# Patient Record
Sex: Male | Born: 1950 | Race: White | Hispanic: No | Smoking: Current some day smoker
Health system: Southern US, Community
[De-identification: ages and names within clinical notes are randomized; demographics above are authoritative.]

## PROBLEM LIST (undated history)

## (undated) DIAGNOSIS — J449 Chronic obstructive pulmonary disease, unspecified: Secondary | ICD-10-CM

## (undated) DIAGNOSIS — Z9889 Other specified postprocedural states: Secondary | ICD-10-CM

## (undated) DIAGNOSIS — I10 Essential (primary) hypertension: Secondary | ICD-10-CM

## (undated) DIAGNOSIS — C801 Malignant (primary) neoplasm, unspecified: Secondary | ICD-10-CM

## (undated) DIAGNOSIS — I771 Stricture of artery: Secondary | ICD-10-CM

## (undated) DIAGNOSIS — K559 Vascular disorder of intestine, unspecified: Secondary | ICD-10-CM

## (undated) DIAGNOSIS — I272 Pulmonary hypertension, unspecified: Secondary | ICD-10-CM

## (undated) DIAGNOSIS — T7840XA Allergy, unspecified, initial encounter: Secondary | ICD-10-CM

## (undated) DIAGNOSIS — Z720 Tobacco use: Secondary | ICD-10-CM

## (undated) DIAGNOSIS — I998 Other disorder of circulatory system: Secondary | ICD-10-CM

## (undated) DIAGNOSIS — K859 Acute pancreatitis without necrosis or infection, unspecified: Secondary | ICD-10-CM

## (undated) DIAGNOSIS — F419 Anxiety disorder, unspecified: Secondary | ICD-10-CM

## (undated) DIAGNOSIS — I739 Peripheral vascular disease, unspecified: Secondary | ICD-10-CM

## (undated) HISTORY — DX: Allergy, unspecified, initial encounter: T78.40XA

## (undated) HISTORY — DX: Tobacco use: Z72.0

## (undated) HISTORY — DX: Acute pancreatitis without necrosis or infection, unspecified: K85.90

## (undated) HISTORY — DX: Essential (primary) hypertension: I10

## (undated) HISTORY — DX: Vascular disorder of intestine, unspecified: K55.9

## (undated) HISTORY — PX: COLOSTOMY REVERSAL: SHX5782

## (undated) HISTORY — DX: Other disorder of circulatory system: I99.8

## (undated) HISTORY — DX: Peripheral vascular disease, unspecified: I73.9

## (undated) HISTORY — DX: Chronic obstructive pulmonary disease, unspecified: J44.9

## (undated) HISTORY — PX: COLECTOMY: SHX59

## (undated) HISTORY — DX: Stricture of artery: I77.1

## (undated) HISTORY — DX: Pulmonary hypertension, unspecified: I27.20

## (undated) HISTORY — DX: Other specified postprocedural states: Z98.890

## (undated) HISTORY — PX: UPPER GI ENDOSCOPY: SHX6162

## (undated) HISTORY — PX: COLOSTOMY: SHX63

---
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
## 2004-06-09 HISTORY — PX: FOOT SURGERY: SHX648

## 2005-06-09 HISTORY — PX: PANCREAS SURGERY: SHX731

## 2012-08-19 DIAGNOSIS — Z733 Stress, not elsewhere classified: Secondary | ICD-10-CM | POA: Diagnosis not present

## 2012-10-07 DIAGNOSIS — R5381 Other malaise: Secondary | ICD-10-CM | POA: Diagnosis not present

## 2012-10-07 DIAGNOSIS — F17200 Nicotine dependence, unspecified, uncomplicated: Secondary | ICD-10-CM | POA: Diagnosis not present

## 2012-10-07 DIAGNOSIS — R5383 Other fatigue: Secondary | ICD-10-CM | POA: Diagnosis not present

## 2012-10-07 DIAGNOSIS — I10 Essential (primary) hypertension: Secondary | ICD-10-CM | POA: Diagnosis not present

## 2012-10-07 DIAGNOSIS — F329 Major depressive disorder, single episode, unspecified: Secondary | ICD-10-CM | POA: Diagnosis not present

## 2012-10-07 DIAGNOSIS — E871 Hypo-osmolality and hyponatremia: Secondary | ICD-10-CM | POA: Diagnosis not present

## 2012-10-12 DIAGNOSIS — I10 Essential (primary) hypertension: Secondary | ICD-10-CM | POA: Diagnosis not present

## 2012-10-12 DIAGNOSIS — R5381 Other malaise: Secondary | ICD-10-CM | POA: Diagnosis not present

## 2012-10-12 DIAGNOSIS — E871 Hypo-osmolality and hyponatremia: Secondary | ICD-10-CM | POA: Diagnosis not present

## 2012-10-12 DIAGNOSIS — F329 Major depressive disorder, single episode, unspecified: Secondary | ICD-10-CM | POA: Diagnosis not present

## 2012-10-15 DIAGNOSIS — I10 Essential (primary) hypertension: Secondary | ICD-10-CM | POA: Diagnosis not present

## 2012-10-15 DIAGNOSIS — F17200 Nicotine dependence, unspecified, uncomplicated: Secondary | ICD-10-CM | POA: Diagnosis not present

## 2012-10-15 DIAGNOSIS — R062 Wheezing: Secondary | ICD-10-CM | POA: Diagnosis not present

## 2012-10-15 DIAGNOSIS — F329 Major depressive disorder, single episode, unspecified: Secondary | ICD-10-CM | POA: Diagnosis not present

## 2012-10-15 DIAGNOSIS — E871 Hypo-osmolality and hyponatremia: Secondary | ICD-10-CM | POA: Diagnosis not present

## 2012-10-29 DIAGNOSIS — I10 Essential (primary) hypertension: Secondary | ICD-10-CM | POA: Diagnosis not present

## 2012-12-28 DIAGNOSIS — S42009A Fracture of unspecified part of unspecified clavicle, initial encounter for closed fracture: Secondary | ICD-10-CM | POA: Diagnosis not present

## 2012-12-28 DIAGNOSIS — M25519 Pain in unspecified shoulder: Secondary | ICD-10-CM | POA: Diagnosis not present

## 2013-01-07 DIAGNOSIS — S42009A Fracture of unspecified part of unspecified clavicle, initial encounter for closed fracture: Secondary | ICD-10-CM | POA: Diagnosis not present

## 2013-01-12 DIAGNOSIS — R079 Chest pain, unspecified: Secondary | ICD-10-CM | POA: Diagnosis not present

## 2013-01-21 DIAGNOSIS — R9431 Abnormal electrocardiogram [ECG] [EKG]: Secondary | ICD-10-CM | POA: Diagnosis not present

## 2013-01-21 DIAGNOSIS — I2129 ST elevation (STEMI) myocardial infarction involving other sites: Secondary | ICD-10-CM | POA: Diagnosis not present

## 2013-01-21 DIAGNOSIS — R0789 Other chest pain: Secondary | ICD-10-CM | POA: Diagnosis not present

## 2013-02-09 DIAGNOSIS — IMO0001 Reserved for inherently not codable concepts without codable children: Secondary | ICD-10-CM | POA: Diagnosis not present

## 2013-03-09 DIAGNOSIS — IMO0001 Reserved for inherently not codable concepts without codable children: Secondary | ICD-10-CM | POA: Diagnosis not present

## 2013-04-13 DIAGNOSIS — IMO0001 Reserved for inherently not codable concepts without codable children: Secondary | ICD-10-CM | POA: Diagnosis not present

## 2013-06-17 DIAGNOSIS — E871 Hypo-osmolality and hyponatremia: Secondary | ICD-10-CM | POA: Diagnosis not present

## 2013-06-17 DIAGNOSIS — E878 Other disorders of electrolyte and fluid balance, not elsewhere classified: Secondary | ICD-10-CM | POA: Diagnosis not present

## 2013-09-21 DIAGNOSIS — M21619 Bunion of unspecified foot: Secondary | ICD-10-CM | POA: Diagnosis not present

## 2013-10-26 DIAGNOSIS — M21619 Bunion of unspecified foot: Secondary | ICD-10-CM | POA: Diagnosis not present

## 2013-10-26 DIAGNOSIS — I739 Peripheral vascular disease, unspecified: Secondary | ICD-10-CM | POA: Diagnosis not present

## 2013-10-28 DIAGNOSIS — W57XXXA Bitten or stung by nonvenomous insect and other nonvenomous arthropods, initial encounter: Secondary | ICD-10-CM | POA: Diagnosis not present

## 2013-10-28 DIAGNOSIS — T148 Other injury of unspecified body region: Secondary | ICD-10-CM | POA: Diagnosis not present

## 2013-11-01 DIAGNOSIS — T148 Other injury of unspecified body region: Secondary | ICD-10-CM | POA: Diagnosis not present

## 2013-11-04 DIAGNOSIS — I739 Peripheral vascular disease, unspecified: Secondary | ICD-10-CM | POA: Diagnosis not present

## 2013-11-04 DIAGNOSIS — I743 Embolism and thrombosis of arteries of the lower extremities: Secondary | ICD-10-CM | POA: Diagnosis not present

## 2013-11-04 DIAGNOSIS — Z01818 Encounter for other preprocedural examination: Secondary | ICD-10-CM | POA: Diagnosis not present

## 2013-11-30 DIAGNOSIS — I739 Peripheral vascular disease, unspecified: Secondary | ICD-10-CM | POA: Diagnosis not present

## 2013-12-01 DIAGNOSIS — Z131 Encounter for screening for diabetes mellitus: Secondary | ICD-10-CM | POA: Diagnosis not present

## 2013-12-01 DIAGNOSIS — Z125 Encounter for screening for malignant neoplasm of prostate: Secondary | ICD-10-CM | POA: Diagnosis not present

## 2013-12-01 DIAGNOSIS — Z0389 Encounter for observation for other suspected diseases and conditions ruled out: Secondary | ICD-10-CM | POA: Diagnosis not present

## 2013-12-01 DIAGNOSIS — Z136 Encounter for screening for cardiovascular disorders: Secondary | ICD-10-CM | POA: Diagnosis not present

## 2013-12-07 DIAGNOSIS — M21619 Bunion of unspecified foot: Secondary | ICD-10-CM | POA: Diagnosis not present

## 2013-12-19 DIAGNOSIS — F17200 Nicotine dependence, unspecified, uncomplicated: Secondary | ICD-10-CM | POA: Diagnosis not present

## 2013-12-19 DIAGNOSIS — R972 Elevated prostate specific antigen [PSA]: Secondary | ICD-10-CM | POA: Diagnosis not present

## 2013-12-19 DIAGNOSIS — E7800 Pure hypercholesterolemia, unspecified: Secondary | ICD-10-CM | POA: Diagnosis not present

## 2013-12-28 DIAGNOSIS — R39198 Other difficulties with micturition: Secondary | ICD-10-CM | POA: Diagnosis not present

## 2013-12-28 DIAGNOSIS — N529 Male erectile dysfunction, unspecified: Secondary | ICD-10-CM | POA: Diagnosis not present

## 2014-05-19 DIAGNOSIS — L2489 Irritant contact dermatitis due to other agents: Secondary | ICD-10-CM | POA: Diagnosis not present

## 2014-05-19 DIAGNOSIS — E871 Hypo-osmolality and hyponatremia: Secondary | ICD-10-CM | POA: Diagnosis not present

## 2014-05-19 DIAGNOSIS — R972 Elevated prostate specific antigen [PSA]: Secondary | ICD-10-CM | POA: Diagnosis not present

## 2014-05-19 DIAGNOSIS — E780 Pure hypercholesterolemia: Secondary | ICD-10-CM | POA: Diagnosis not present

## 2014-05-19 DIAGNOSIS — L853 Xerosis cutis: Secondary | ICD-10-CM | POA: Diagnosis not present

## 2014-05-19 DIAGNOSIS — Z23 Encounter for immunization: Secondary | ICD-10-CM | POA: Diagnosis not present

## 2014-05-24 DIAGNOSIS — E871 Hypo-osmolality and hyponatremia: Secondary | ICD-10-CM | POA: Diagnosis not present

## 2014-08-01 DIAGNOSIS — J069 Acute upper respiratory infection, unspecified: Secondary | ICD-10-CM | POA: Diagnosis not present

## 2014-08-01 DIAGNOSIS — Z720 Tobacco use: Secondary | ICD-10-CM | POA: Diagnosis not present

## 2014-08-11 DIAGNOSIS — B9789 Other viral agents as the cause of diseases classified elsewhere: Secondary | ICD-10-CM | POA: Diagnosis not present

## 2014-08-11 DIAGNOSIS — Z720 Tobacco use: Secondary | ICD-10-CM | POA: Diagnosis not present

## 2014-08-11 DIAGNOSIS — J069 Acute upper respiratory infection, unspecified: Secondary | ICD-10-CM | POA: Diagnosis not present

## 2014-08-11 DIAGNOSIS — Z0000 Encounter for general adult medical examination without abnormal findings: Secondary | ICD-10-CM | POA: Diagnosis not present

## 2014-08-11 DIAGNOSIS — J4521 Mild intermittent asthma with (acute) exacerbation: Secondary | ICD-10-CM | POA: Diagnosis not present

## 2014-08-14 DIAGNOSIS — R072 Precordial pain: Secondary | ICD-10-CM | POA: Diagnosis not present

## 2014-09-29 DIAGNOSIS — Z720 Tobacco use: Secondary | ICD-10-CM | POA: Diagnosis not present

## 2014-09-29 DIAGNOSIS — I10 Essential (primary) hypertension: Secondary | ICD-10-CM | POA: Diagnosis not present

## 2014-09-29 DIAGNOSIS — Z125 Encounter for screening for malignant neoplasm of prostate: Secondary | ICD-10-CM | POA: Diagnosis not present

## 2014-09-29 DIAGNOSIS — J449 Chronic obstructive pulmonary disease, unspecified: Secondary | ICD-10-CM | POA: Diagnosis not present

## 2014-10-13 DIAGNOSIS — Z125 Encounter for screening for malignant neoplasm of prostate: Secondary | ICD-10-CM | POA: Diagnosis not present

## 2014-10-13 DIAGNOSIS — I10 Essential (primary) hypertension: Secondary | ICD-10-CM | POA: Diagnosis not present

## 2014-10-18 ENCOUNTER — Other Ambulatory Visit: Payer: Self-pay | Admitting: Internal Medicine

## 2014-10-18 DIAGNOSIS — R739 Hyperglycemia, unspecified: Secondary | ICD-10-CM | POA: Diagnosis not present

## 2014-10-18 DIAGNOSIS — J449 Chronic obstructive pulmonary disease, unspecified: Secondary | ICD-10-CM | POA: Diagnosis not present

## 2014-10-18 DIAGNOSIS — R972 Elevated prostate specific antigen [PSA]: Secondary | ICD-10-CM | POA: Diagnosis not present

## 2014-10-18 DIAGNOSIS — I10 Essential (primary) hypertension: Secondary | ICD-10-CM | POA: Diagnosis not present

## 2014-10-18 DIAGNOSIS — R911 Solitary pulmonary nodule: Secondary | ICD-10-CM

## 2014-10-25 ENCOUNTER — Ambulatory Visit
Admission: RE | Admit: 2014-10-25 | Discharge: 2014-10-25 | Disposition: A | Discharge status: Home / Self Care | Payer: Medicare Other | Source: Ambulatory Visit | Attending: Internal Medicine | Admitting: Internal Medicine

## 2014-10-25 DIAGNOSIS — J432 Centrilobular emphysema: Secondary | ICD-10-CM | POA: Diagnosis not present

## 2014-10-25 DIAGNOSIS — R911 Solitary pulmonary nodule: Secondary | ICD-10-CM | POA: Diagnosis not present

## 2014-10-25 MED ORDER — IOHEXOL 300 MG/ML  SOLN
75.0000 mL | Freq: Once | INTRAMUSCULAR | Status: AC | PRN
  Administered 2014-10-25: 75 mL via INTRAVENOUS

## 2014-10-30 ENCOUNTER — Encounter: Payer: Self-pay | Admitting: *Deleted

## 2014-10-30 ENCOUNTER — Other Ambulatory Visit (HOSPITAL_COMMUNITY): Payer: Self-pay | Admitting: Respiratory Therapy

## 2014-10-30 ENCOUNTER — Other Ambulatory Visit (HOSPITAL_COMMUNITY): Payer: Self-pay | Admitting: Internal Medicine

## 2014-10-30 DIAGNOSIS — J441 Chronic obstructive pulmonary disease with (acute) exacerbation: Secondary | ICD-10-CM

## 2014-10-30 DIAGNOSIS — R911 Solitary pulmonary nodule: Secondary | ICD-10-CM

## 2014-10-31 ENCOUNTER — Telehealth: Payer: Self-pay | Admitting: Internal Medicine

## 2014-10-31 NOTE — Telephone Encounter (Signed)
NEW PATIENT APPT-LEFT MESSAGE FOR PATIENT TO RETURN CALL

## 2014-11-01 ENCOUNTER — Inpatient Hospital Stay (HOSPITAL_COMMUNITY): Admission: RE | Admit: 2014-11-01 | Payer: Medicare Other | Source: Ambulatory Visit

## 2014-11-01 ENCOUNTER — Ambulatory Visit (HOSPITAL_COMMUNITY): Payer: Medicare Other | Attending: Internal Medicine

## 2014-11-02 ENCOUNTER — Encounter (HOSPITAL_COMMUNITY): Payer: Medicare Other

## 2014-11-03 ENCOUNTER — Telehealth: Payer: Self-pay | Admitting: Internal Medicine

## 2014-11-03 ENCOUNTER — Other Ambulatory Visit: Payer: Self-pay | Admitting: *Deleted

## 2014-11-03 DIAGNOSIS — R918 Other nonspecific abnormal finding of lung field: Secondary | ICD-10-CM

## 2014-11-03 NOTE — Telephone Encounter (Signed)
new patient appt-left message for patient to return call. Called referring left message for [name] stating message was left for patient to return call.

## 2014-11-03 NOTE — Progress Notes (Signed)
Oncology Nurse Navigator Documentation  Oncology Nurse Navigator Flowsheets 11/03/2014  Navigator Encounter Type Other.  Received referral and noted PET scan was scheduled for 11/02/14.  I looked today and scan was not completed.  I gave Jonelle Sidle a time to schedule patient on June 13th.  I called referring office, Dr. [name] to have them re-schedule PET.  I was unable to speak to anyone nor leave a vm message.    Time Spent with Patient 15

## 2014-11-15 DIAGNOSIS — R739 Hyperglycemia, unspecified: Secondary | ICD-10-CM | POA: Diagnosis not present

## 2014-11-15 DIAGNOSIS — E871 Hypo-osmolality and hyponatremia: Secondary | ICD-10-CM | POA: Diagnosis not present

## 2014-11-17 ENCOUNTER — Ambulatory Visit (HOSPITAL_COMMUNITY)
Admission: RE | Admit: 2014-11-17 | Discharge: 2014-11-17 | Disposition: A | Discharge status: Home / Self Care | Payer: Medicare Other | Source: Ambulatory Visit | Attending: Internal Medicine | Admitting: Internal Medicine

## 2014-11-17 DIAGNOSIS — K868 Other specified diseases of pancreas: Secondary | ICD-10-CM | POA: Insufficient documentation

## 2014-11-17 DIAGNOSIS — I2510 Atherosclerotic heart disease of native coronary artery without angina pectoris: Secondary | ICD-10-CM | POA: Diagnosis not present

## 2014-11-17 DIAGNOSIS — R911 Solitary pulmonary nodule: Secondary | ICD-10-CM | POA: Diagnosis not present

## 2014-11-17 DIAGNOSIS — J432 Centrilobular emphysema: Secondary | ICD-10-CM | POA: Insufficient documentation

## 2014-11-17 LAB — GLUCOSE, CAPILLARY: Glucose-Capillary: 91 mg/dL (ref 65–99)

## 2014-11-17 MED ORDER — FLUDEOXYGLUCOSE F - 18 (FDG) INJECTION
4.9800 | Freq: Once | INTRAVENOUS | Status: AC | PRN
  Administered 2014-11-17: 4.98 via INTRAVENOUS

## 2014-11-20 ENCOUNTER — Ambulatory Visit (HOSPITAL_BASED_OUTPATIENT_CLINIC_OR_DEPARTMENT_OTHER): Payer: Medicare Other | Admitting: Internal Medicine

## 2014-11-20 ENCOUNTER — Encounter: Payer: Self-pay | Admitting: *Deleted

## 2014-11-20 ENCOUNTER — Encounter: Payer: Self-pay | Admitting: Internal Medicine

## 2014-11-20 ENCOUNTER — Ambulatory Visit: Payer: Medicare Other

## 2014-11-20 ENCOUNTER — Other Ambulatory Visit (HOSPITAL_BASED_OUTPATIENT_CLINIC_OR_DEPARTMENT_OTHER): Payer: Medicare Other

## 2014-11-20 ENCOUNTER — Other Ambulatory Visit: Payer: Self-pay | Admitting: Internal Medicine

## 2014-11-20 VITALS — BP 123/87 | HR 67 | Temp 98.0°F | Resp 18 | Ht 62.0 in | Wt 93.5 lb

## 2014-11-20 DIAGNOSIS — J449 Chronic obstructive pulmonary disease, unspecified: Secondary | ICD-10-CM | POA: Diagnosis not present

## 2014-11-20 DIAGNOSIS — Z720 Tobacco use: Secondary | ICD-10-CM

## 2014-11-20 DIAGNOSIS — R918 Other nonspecific abnormal finding of lung field: Secondary | ICD-10-CM

## 2014-11-20 DIAGNOSIS — I10 Essential (primary) hypertension: Secondary | ICD-10-CM | POA: Diagnosis not present

## 2014-11-20 DIAGNOSIS — Z809 Family history of malignant neoplasm, unspecified: Secondary | ICD-10-CM | POA: Diagnosis not present

## 2014-11-20 LAB — CBC WITH DIFFERENTIAL/PLATELET
BASO%: 1.2 % (ref 0.0–2.0)
Basophils Absolute: 0.1 10*3/uL (ref 0.0–0.1)
EOS%: 5.4 % (ref 0.0–7.0)
Eosinophils Absolute: 0.2 10*3/uL (ref 0.0–0.5)
HEMATOCRIT: 42.2 % (ref 38.4–49.9)
HGB: 14 g/dL (ref 13.0–17.1)
LYMPH#: 1.2 10*3/uL (ref 0.9–3.3)
LYMPH%: 26.8 % (ref 14.0–49.0)
MCH: 31 pg (ref 27.2–33.4)
MCHC: 33.2 g/dL (ref 32.0–36.0)
MCV: 93.2 fL (ref 79.3–98.0)
MONO#: 0.8 10*3/uL (ref 0.1–0.9)
MONO%: 17.8 % — ABNORMAL HIGH (ref 0.0–14.0)
NEUT#: 2.1 10*3/uL (ref 1.5–6.5)
NEUT%: 48.8 % (ref 39.0–75.0)
Platelets: 206 10*3/uL (ref 140–400)
RBC: 4.52 10*6/uL (ref 4.20–5.82)
RDW: 15.2 % — ABNORMAL HIGH (ref 11.0–14.6)
WBC: 4.3 10*3/uL (ref 4.0–10.3)

## 2014-11-20 LAB — COMPREHENSIVE METABOLIC PANEL (CC13)
ALT: 20 U/L (ref 0–55)
AST: 39 U/L — AB (ref 5–34)
Albumin: 3.6 g/dL (ref 3.5–5.0)
Alkaline Phosphatase: 131 U/L (ref 40–150)
Anion Gap: 11 mEq/L (ref 3–11)
BUN: 10.9 mg/dL (ref 7.0–26.0)
CO2: 22 mEq/L (ref 22–29)
Calcium: 9.3 mg/dL (ref 8.4–10.4)
Chloride: 102 mEq/L (ref 98–109)
Creatinine: 0.8 mg/dL (ref 0.7–1.3)
EGFR: >90 mL/min/{1.73_m2} (ref >90)
Glucose: 100 mg/dl (ref 70–140)
POTASSIUM: 4.5 meq/L (ref 3.5–5.1)
SODIUM: 135 meq/L — AB (ref 136–145)
TOTAL PROTEIN: 7.3 g/dL (ref 6.4–8.3)
Total Bilirubin: 0.29 mg/dL (ref 0.20–1.20)

## 2014-11-20 NOTE — Progress Notes (Signed)
Checked in new pt with no financial concerns prior to seeing the dr. [name] has 2 insurances so financial assistance may not be needed but he has my card for any questions or concerns.

## 2014-11-20 NOTE — Progress Notes (Signed)
[facility] [phone]   [phone]  CONSULT NOTE  REFERRING PHYSICIAN: Dr. [name]  REASON FOR CONSULTATION:  64 years old white male with pulmonary nodules.  HPI [name] is a 64 y.o. male with past medical history significant for hypertension, ischemic bowel disease, COPD as well as long history of smoking. The patient was seen by Dr. [name] to establish care with him. During his evaluation routine chest x-ray was performed that showed nodules in the left lung. This was followed by CT scan of the chest on 10/25/2014 and it showed a spiculated nodule of the left upper lobe, abutting the minor fissure and measuring 1.0 cm. There was also 0.8-0.9 cm nodule of the left apex involving the visceral pleura. A PET scan was performed on 11/17/2014 and it showed 10 mm spiculated posterior left upper lobe nodule is hypermetabolic consistent with neoplasm. 1.9 cm hypermetabolic a amorphous lesion is seen in the posteromedial left lower lobe. Synchronous neoplasm cannot be excluded. The patient was referred to me today for further evaluation and recommendation regarding these findings and his lung. When seen today he is feeling fine with no specific complaints except for baseline shortness of breath and cough productive of clear sputum. He denied having any significant chest pain or hemoptysis. The patient has no weight loss or night sweats. He denied having any significant nausea or vomiting, no headache or visual changes. 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  FAMILY HISTORY: Father had hypertension and osteoarthritis, mother had COPD, brother had cancer   SOCIAL HISTORY: The patient is single and was accompanied by his girlfriend, Donita. He is currently retired and used to do dry oral work. Has a history of smoking up to 2 packs per day for around  51 years but he is currently smoking half a pack per day. He drinks 5 beers every night and no history of drug abuse.   History  Substance Use Topics  . Smoking status: Current Every Day Smoker -- 0.50 packs/day for 15 years  . Smokeless tobacco: Not on file  . Alcohol Use: 2.4 oz/week    4 Cans of beer per week     Comment: per day    Not on File  Current Outpatient Prescriptions  Medication Sig Dispense Refill  . albuterol (PROVENTIL) (2.5 MG/3ML) 0.083% nebulizer solution Take 2.5 mg by nebulization every 6 (six) hours as needed for wheezing or shortness of breath.    Marland Kitchen amLODipine (NORVASC) 10 MG tablet Take 10 mg by mouth daily.    Marland Kitchen buPROPion (WELLBUTRIN XL) 150 MG 24 hr tablet Take 150 mg by mouth daily.    . metoprolol succinate (TOPROL-XL) 25 MG 24 hr tablet Take 25 mg by mouth daily.    Marland Kitchen PROAIR HFA 108 (90 BASE) MCG/ACT inhaler Inhale 90 mcg into the lungs as needed.     No current facility-administered medications for this visit.    Review of Systems  Constitutional: negative Eyes: negative Ears, nose, mouth, throat, and face: negative Respiratory: positive for cough, dyspnea on exertion and sputum Cardiovascular: negative Gastrointestinal: negative Genitourinary:negative Integument/breast: negative Hematologic/lymphatic: negative Musculoskeletal:negative Neurological: negative Behavioral/Psych: negative Endocrine: negative Allergic/Immunologic: negative  Physical Exam  MCN:OBSJG, healthy, no distress and malnourished SKIN: skin color, texture, turgor are normal, no rashes or significant lesions HEAD: Normocephalic, No masses, lesions, tenderness or abnormalities EYES: normal, PERRLA EARS: External ears normal, Canals  clear OROPHARYNX:no exudate, no erythema and lips, buccal mucosa, and tongue normal  NECK: supple, no adenopathy, no JVD LYMPH:  no palpable lymphadenopathy, no hepatosplenomegaly LUNGS: clear to auscultation , and palpation HEART: regular  rate & rhythm and no murmurs ABDOMEN:abdomen soft, non-tender and normal bowel sounds BACK: Back symmetric, no curvature., No CVA tenderness EXTREMITIES:no joint deformities, effusion, or inflammation, no edema, no skin discoloration  NEURO: alert & oriented x 3 with fluent speech, no focal motor/sensory deficits  PERFORMANCE STATUS: ECOG 1  LABORATORY DATA: Lab Results  Component Value Date   WBC 4.3 11/20/2014   HGB 14.0 11/20/2014   HCT 42.2 11/20/2014   MCV 93.2 11/20/2014   PLT 206 11/20/2014      Chemistry      Component Value Date/Time   NA 135* 11/20/2014 1352   K 4.5 11/20/2014 1352   CO2 22 11/20/2014 1352   BUN 10.9 11/20/2014 1352   CREATININE 0.8 11/20/2014 1352      Component Value Date/Time   CALCIUM 9.3 11/20/2014 1352   ALKPHOS 131 11/20/2014 1352   AST 39* 11/20/2014 1352   ALT 20 11/20/2014 1352   BILITOT 0.29 11/20/2014 1352       RADIOGRAPHIC STUDIES: Ct Chest W Contrast  10/25/2014   CLINICAL DATA:  64 year old male with a history of lung nodule. Shortness of breath.  EXAM: CT CHEST WITH CONTRAST  TECHNIQUE: Multidetector CT imaging of the chest was performed during intravenous contrast administration.  CONTRAST:  44m OMNIPAQUE IOHEXOL 300 MG/ML  SOLN  COMPARISON:  None.  FINDINGS: Chest:  Unremarkable superficial soft tissues of the chest wall.  Bilateral axillary lymph nodes. Largest on the left measures 6 mm. No supraclavicular adenopathy. No scalene adenopathy.  Unremarkable appearance of the thoracic inlet. Unremarkable thyroid.  Lymph nodes within multiple mediastinal nodal stations, none of which are enlarged by CT size criteria.  Heart size within normal limits. Trace pericardial fluid/ thickening.  Calcifications of left main, left anterior descending, circumflex, right coronary arteries.  Atherosclerotic changes of the thoracic aorta. No aneurysm or dissection flap. No periaortic fluid.  Unremarkable appearance of pulmonary artery. No central,  lobar, segmental, or proximal subsegmental filling defects.  Extensive paraseptal/ centrilobular emphysema the bilateral lungs with hyperinflation.  Minimal architectural distortion/ scarring of the lungs. No confluent airspace disease, pneumothorax, or pleural effusion.  Spiculated nodule of the left upper lobe, abutting the minor fissure measuring 1 cm on image 26 of series 5.  8 mm to 9 mm nodule of the left apex involving the visceral pleura. Partially calcified nodule of the right lower lobe adjacent to the major fissure, likely a granuloma.  No significant bronchial wall thickening. Central airways are clear.  Upper abdomen:  Unremarkable liver and spleen.  Small hiatal hernia.  Calcifications of the pancreas. Mild dilation of the pancreatic duct within the pancreatic body. Calcifications at the pancreatic head.  Unremarkable gallbladder.  Unremarkable appearance of the bilateral kidneys.  Unremarkable appearance of the visualized small bowel and colon.  Calcifications of abdominal aorta.  No displaced fracture. Degenerative changes of the visualized spine. Sclerotic focus of T10 posterior rib vertebral body.  IMPRESSION: Suspicious spiculated nodule of the left upper lobe measuring 1 cm abutting the major fissure. Further evaluation with PET-CT is recommended.  Nonenlarged mediastinal and axillary lymph nodes, potentially reactive.  Nodule of the left apex extending to the visceral pleura measures 8 mm to 9 mm. Recommend attention on future follow-up studies.  Extensive paraseptal/centrilobular emphysema.  Architectural  distortion with ill-defined nodularity along the medial aspect of the left lower lobe, potentially inflammatory/scarring. Recommend attention on future follow-up images.  Trace pericardial fluid/ thickening.  Pancreatic duct is dilated, with incomplete imaging of the pancreatic head. Calcifications of the pancreatic parenchyma present, potential representing chronic inflammation/ prior  pancreatitis. Further evaluation of the pancreas is recommended with contrast-enhanced CT or contrast-enhanced MRI.  These results were called by telephone at the time of interpretation on 10/25/2014 at 1:39 pm to Dr. Rosalie Doctor nurse, Ms Allene Dillon, who verbally acknowledged these results.  Signed,  Dulcy Fanny. Earleen Newport, DO  [facility]  [facility]   Electronically Signed   By: Corrie Mckusick D.O.   On: 10/25/2014 13:45   Nm Pet Image Initial (pi) Skull Base To Thigh  11/17/2014   CLINICAL DATA:  Initial treatment strategy for lung nodule.  EXAM: NUCLEAR MEDICINE PET SKULL BASE TO THIGH  TECHNIQUE: 5.0 mCi F-18 FDG was injected intravenously. Full-ring PET imaging was performed from the skull base to thigh after the radiotracer. CT data was obtained and used for attenuation correction and anatomic localization.  FASTING BLOOD GLUCOSE:  Value: 91 mg/dl  COMPARISON:  None.  FINDINGS: NECK  No hypermetabolic lymph nodes in the neck.  CHEST  No hypermetabolic mediastinal or hilar nodes. Heart size is normal. Coronary artery calcification is evident. No pericardial effusion.  Lung windows reveal centrilobular emphysema bilaterally. 10 mm spiculated nodule in the posterior left upper lobe is hypermetabolic with SUV max = 2.5. 1.9 cm Ill-defined area of a amorphous soft tissue attenuation is seen in the posteromedial left lower lobe (image 51 series 6) with SUV max = 2.7. 7 mm ground-glass nodule in the posterior right upper lobe on image 24 shows no discernible hypermetabolic FDG uptake on PET imaging. Right lower lobe calcified granulomas seen on image 60 series 6.  ABDOMEN/PELVIS  No abnormal hypermetabolic activity within the liver, adrenal glands, or spleen. There is an area of increased FDG uptake in the head of the pancreas with SUV max = 3.8. CT images show clustered dystrophic calcification in the head and uncinate process of the pancreas at this location. These  changes are associated with diffuse distention of the main pancreatic duct and scattered parenchymal calcification in the body and tail of pancreas.  Probable calcified gallstone in the neck of the gallbladder. Dense atherosclerotic calcification is seen in the wall of the abdominal aorta without aneurysm. Surgical mesh is seen in the anterior aspect of the left abdomen there is thickening along the deep rectus fascia of the right anterior abdominal wall with scattered areas of mineralization and possible surgical material. This area shows low level FDG uptake.  Left groin hernia contains only fat.  No hypermetabolic lymph nodes in the abdomen or pelvis.  There is a small focus of hypermetabolism seen along the right wall of the distal rectum with SUV max = 4.7  SKELETON  No focal hypermetabolic activity to suggest skeletal metastasis.  IMPRESSION: 1. 10 mm spiculated posterior left upper lobe nodule is hypermetabolic consistent with neoplasm. 2. 1.9 cm hypermetabolic a amorphous lesion is seen in the posteromedial left lower lobe. Synchronous neoplasm cannot be excluded. 3. Diffuse dystrophic calcification in the pancreatic parenchyma with associated diffuse distention of the main pancreatic duct. Calcification is more focal in the region of the pancreatic head in the same area shows hypermetabolism. The hypermetabolic uptake and calcification in the head of the pancreas may be related to a history  of chronic/ongoing inflammation related to chronic pancreatitis. Underlying pancreatic neoplasm involving the head of the pancreas cannot be excluded. 4. Surgical mesh is seen in the anterior left abdominal wall. There is a larger area of peritoneal thickening associated with the anterior right abdominal wall. This is less definitely related to match and the changes seen on the left side and the area of thickening shows low level FDG uptake. A degree of uptake is within the spectrum of that seen with granulation  secondary to scar/healing, but correlation for previous bilateral anterior abdominal wall surgery is recommended as peritoneal or even omental neoplastic disease cannot be completely excluded. 5. Small hypermetabolic focus in the distal rectum/anus. Polyp/neoplasm at this location is a possibility.   Electronically Signed   By: Misty Stanley M.D.   On: 11/17/2014 13:36    ASSESSMENT: This is a very pleasant 64 years old white male with highly suspicious hypermetabolic pulmonary nodules in the left lung concerning for lung cancer.   PLAN: I had a lengthy discussion with the patient his girlfriend about the finding on his CT scan as well as a PET scan. I showed the images of the PET scan. The patient already have pulmonary function tests performed recently. I will arrange for him to see thoracic surgery for evaluation of resection of tissue diagnosis if he is not a surgical candidate. I will see him back for follow-up visit after his surgical evaluation. He was advised to call immediately if he has any concerning symptoms in the interval.  The patient voices understanding of current disease status and treatment options and is in agreement with the current care plan.  All questions were answered. The patient knows to call the clinic with any problems, questions or concerns. We can certainly see the patient much sooner if necessary.  Thank you so much for allowing me to participate in the care of [facility]. I will continue to follow up the patient with you and assist in his care.  I spent 30 minutes counseling the patient face to face. The total time spent in the appointment was 55 minutes.  Disclaimer: This note was dictated with voice recognition software. Similar sounding words can inadvertently be transcribed and may not be corrected upon review.   Mykah Bellomo K. November 20, 2014, 4:05 PM

## 2014-11-20 NOTE — Progress Notes (Signed)
Oncology Nurse Navigator Documentation  Oncology Nurse Navigator Flowsheets 11/20/2014  Navigator Encounter Type Initial MedOnc  Patient Visit Type Medonc  Treatment Phase Abnormal Scans  Barriers/Navigation Needs Education  Education Smoking cessation  Coordination of Care MD Appointments/Spoke with patient and girlfriend today during his first visit with Dr. Julien Nordmann.  Patient has work up scans and PFT's but no tissue dx at this time.  I set him up for thoracic clinic on 11/23/14 per Dr. Worthy Flank request with surgery.  I gave him time and date of appt and he verbalized understanding.  We also spoke about smoking cessation.  I gave him information on smoking cessation.   Support Groups/Services Other  Time Spent with Patient 30

## 2014-11-22 DIAGNOSIS — J449 Chronic obstructive pulmonary disease, unspecified: Secondary | ICD-10-CM | POA: Diagnosis not present

## 2014-11-22 DIAGNOSIS — E871 Hypo-osmolality and hyponatremia: Secondary | ICD-10-CM | POA: Diagnosis not present

## 2014-11-22 DIAGNOSIS — I10 Essential (primary) hypertension: Secondary | ICD-10-CM | POA: Diagnosis not present

## 2014-11-22 DIAGNOSIS — R739 Hyperglycemia, unspecified: Secondary | ICD-10-CM | POA: Diagnosis not present

## 2014-11-23 ENCOUNTER — Other Ambulatory Visit: Payer: Self-pay | Admitting: *Deleted

## 2014-11-23 ENCOUNTER — Encounter: Payer: Self-pay | Admitting: *Deleted

## 2014-11-23 ENCOUNTER — Encounter: Payer: Self-pay | Admitting: Thoracic Surgery (Cardiothoracic Vascular Surgery)

## 2014-11-23 ENCOUNTER — Institutional Professional Consult (permissible substitution) (INDEPENDENT_AMBULATORY_CARE_PROVIDER_SITE_OTHER): Payer: Medicare Other | Admitting: Thoracic Surgery (Cardiothoracic Vascular Surgery)

## 2014-11-23 VITALS — BP 126/76 | HR 57 | Temp 97.7°F | Resp 17 | Ht 62.0 in | Wt 92.4 lb

## 2014-11-23 DIAGNOSIS — R918 Other nonspecific abnormal finding of lung field: Secondary | ICD-10-CM

## 2014-11-23 DIAGNOSIS — J432 Centrilobular emphysema: Secondary | ICD-10-CM

## 2014-11-23 DIAGNOSIS — Q453 Other congenital malformations of pancreas and pancreatic duct: Secondary | ICD-10-CM

## 2014-11-23 DIAGNOSIS — K8689 Other specified diseases of pancreas: Secondary | ICD-10-CM | POA: Insufficient documentation

## 2014-11-23 LAB — PULMONARY FUNCTION TEST
DL/VA % pred: 43 %
DL/VA: 1.75 ml/min/mmHg/L
DLCO UNC % PRED: 35 %
DLCO unc: 7.75 ml/min/mmHg
FEF 25-75 Pre: 0.32 L/sec
FEF2575-%Pred-Pre: 15 %
FEV1-%PRED-PRE: 49 %
FEV1-Pre: 1.25 L
FEV1FVC-%Pred-Pre: 65 %
FEV6-%Pred-Pre: 65 %
FEV6-PRE: 2.1 L
FEV6FVC-%Pred-Pre: 88 %
FVC-%Pred-Pre: 74 %
FVC-PRE: 2.54 L
PRE FEV6/FVC RATIO: 83 %
Pre FEV1/FVC ratio: 49 %
RV % PRED: 217 %
RV: 4.07 L
TLC % pred: 125 %
TLC: 6.76 L

## 2014-11-23 NOTE — Progress Notes (Signed)
Oncology Nurse Navigator Documentation  Oncology Nurse Navigator Flowsheets 11/23/2014  Navigator Encounter Type Other/MDC/Surgery  Patient Visit Type Follow-up  Treatment Phase Other/Pre tx  Barriers/Navigation Needs Education.  Explained next steps  Education Next steps  Coordination of Care MD Appointments/Patient needs referral to GI before he can precede with lung work up and biopsy.  I notified HIM dept to refer patient and completed EPIC referral.  I asked that this referral be expedited.   Time Spent with Patient 30

## 2014-11-23 NOTE — Progress Notes (Signed)
[address]       [place] [address]             [phone]      PCP is [name], MD Referring Provider is [name], MD  No chief complaint on file.   HPI: 64 yo man presents with a cc/o lung nodules.  [name] is a 64 yo man with a history of heavy tobacco abuse, 2 ppd x 50 years. He is currently smoking about 0.5 ppd as he is trying to quit. 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  He recently had a physical with Dr. Maudie King. A chest x-ray showed a left lung nodule. A CT showed multiple lung nodules. A PET showed hypermetabolic activity in 3 nodules in the left lung. There is an 8 mm nodule at the apex with mild hypermetabolism, a 9 mm nodule in the left upper lobe along the fissure with significant activity given the size of the lesion, and an amorphous area in the left lower lobe with low grade activity. There was no hilar or mediastinal activity. There was an area of increased metabolic activity in the head of the pancreas in a mass lesion with calcifications.  He is disabled from work but remains relatively active with yard work. He gets SOB with activity but can walk up a flight of stairs without stopping. He would be short of breath. He denies chest pain, pressure or tightness. He has a chronic cough, denies hemoptysis. He has lost 5 pounds in the last 3 months. Now weighs 92 pounds. Denies abdominal pain, N/V.  Past Medical History  Diagnosis Date  . Hypertension   . COPD (chronic obstructive pulmonary disease)   . Status post left foot surgery   . Ischemic bowel disease   Pancreatitis- ? Pancreatic duct stent  Past Surgical History  Procedure Laterality Date  . Colectomy    . Colostomy    . Colostomy reversal    .  Ventral hernia repair      Family History  Problem Relation Age of Onset  . COPD Mother   . Diabetes Father   . Cancer Brother     metastatic, unknown primary    Social History History  Substance Use Topics  . Smoking status: Current Every Day Smoker -- 2.00 packs/day for 50 years    Types: Cigarettes  . Smokeless tobacco: Not on file     Comment: currently 0.5 ppd, trying to quit  . Alcohol Use: 2.4 oz/week    4 Cans of beer per week     Comment: per day    Current Outpatient Prescriptions  Medication Sig Dispense Refill  . albuterol (PROVENTIL) (2.5 MG/3ML) 0.083% nebulizer solution Take 2.5 mg by nebulization every 6 (six) hours as needed for wheezing or shortness of breath.    Marland Kitchen amLODipine (NORVASC) 10 MG tablet Take 10 mg by mouth daily.    Marland Kitchen buPROPion (WELLBUTRIN XL) 150 MG 24 hr tablet Take 150 mg by mouth daily.    . CHANTIX 1 MG tablet     . CHANTIX STARTING MONTH PAK 0.5 MG X 11 & 1 MG X 42 tablet     . metoprolol succinate (TOPROL-XL) 25 MG 24 hr tablet Take 25 mg by mouth daily.    Marland Kitchen  PROAIR HFA 108 (90 BASE) MCG/ACT inhaler Inhale 90 mcg into the lungs as needed.     No current facility-administered medications for this visit.    Not on File  Review of Systems  Constitutional: Positive for unexpected weight change (lost 5 pounds in 3 months). Negative for fever, chills, activity change and appetite change.  HENT: Negative for dental problem.   Eyes: Negative for photophobia and visual disturbance.  Respiratory: Positive for cough (productive, no hemoptysis), shortness of breath and wheezing. Negative for chest tightness.   Cardiovascular: Negative for chest pain, palpitations and leg swelling.       Poor circulation in legs  Gastrointestinal: Negative for nausea, vomiting, abdominal pain, diarrhea, constipation, blood in stool and abdominal distention.  Endocrine: Negative for polydipsia, polyphagia and polyuria.  Genitourinary: Negative for dysuria and  hematuria.  Musculoskeletal: Positive for arthralgias.       Disabled secondary to broken feet  Neurological: Negative for dizziness, seizures, syncope, weakness and headaches.  Hematological: Negative for adenopathy. Does not bruise/bleed easily.  Psychiatric/Behavioral: Positive for dysphoric mood. The patient is nervous/anxious.   All other systems reviewed and are negative.   There were no vitals taken for this visit. Physical Exam  Constitutional: He is oriented to person, place, and time. No distress.  Thin  HENT:  Head: Normocephalic and atraumatic.  Eyes: EOM are normal. Pupils are equal, round, and reactive to light. No scleral icterus.  Neck: Neck supple. No tracheal deviation present. No thyromegaly present.  Cardiovascular: Normal rate, regular rhythm, normal heart sounds and intact distal pulses.  Exam reveals no gallop and no friction rub.   No murmur heard. Pulmonary/Chest: Effort normal.  Diminished BS bilaterally  Abdominal: Soft. Bowel sounds are normal. There is no tenderness.  Deformity from prior surgeries, scars healed  Musculoskeletal: Normal range of motion. He exhibits no edema.  Lymphadenopathy:    He has no cervical adenopathy.  Neurological: He is alert and oriented to person, place, and time. No cranial nerve deficit.  No focal motor deficit  Skin: Skin is warm and dry.  Psychiatric: He has a normal mood and affect.  Vitals reviewed.    Diagnostic Tests: CT CHEST WITH CONTRAST  TECHNIQUE: Multidetector CT imaging of the chest was performed during intravenous contrast administration.  CONTRAST: 89m OMNIPAQUE IOHEXOL 300 MG/ML SOLN  COMPARISON: None.  FINDINGS: Chest:  Unremarkable superficial soft tissues of the chest wall.  Bilateral axillary lymph nodes. Largest on the left measures 6 mm. No supraclavicular adenopathy. No scalene adenopathy.  Unremarkable appearance of the thoracic inlet. Unremarkable thyroid.  Lymph  nodes within multiple mediastinal nodal stations, none of which are enlarged by CT size criteria.  Heart size within normal limits. Trace pericardial fluid/ thickening.  Calcifications of left main, left anterior descending, circumflex, right coronary arteries.  Atherosclerotic changes of the thoracic aorta. No aneurysm or dissection flap. No periaortic fluid.  Unremarkable appearance of pulmonary artery. No central, lobar, segmental, or proximal subsegmental filling defects.  Extensive paraseptal/ centrilobular emphysema the bilateral lungs with hyperinflation.  Minimal architectural distortion/ scarring of the lungs. No confluent airspace disease, pneumothorax, or pleural effusion.  Spiculated nodule of the left upper lobe, abutting the minor fissure measuring 1 cm on image 26 of series 5.  8 mm to 9 mm nodule of the left apex involving the visceral pleura. Partially calcified nodule of the right lower lobe adjacent to the major fissure, likely a granuloma.  No significant bronchial wall thickening. Central airways are clear.  Upper abdomen:  Unremarkable liver and spleen.  Small hiatal hernia.  Calcifications of the pancreas. Mild dilation of the pancreatic duct within the pancreatic body. Calcifications at the pancreatic head.  Unremarkable gallbladder.  Unremarkable appearance of the bilateral kidneys.  Unremarkable appearance of the visualized small bowel and colon.  Calcifications of abdominal aorta.  No displaced fracture. Degenerative changes of the visualized spine. Sclerotic focus of T10 posterior rib vertebral body.  IMPRESSION: Suspicious spiculated nodule of the left upper lobe measuring 1 cm abutting the major fissure. Further evaluation with PET-CT is recommended.  Nonenlarged mediastinal and axillary lymph nodes, potentially reactive.  Nodule of the left apex extending to the visceral pleura measures 8 mm to 9 mm.  Recommend attention on future follow-up studies.  Extensive paraseptal/centrilobular emphysema.  Architectural distortion with ill-defined nodularity along the medial aspect of the left lower lobe, potentially inflammatory/scarring. Recommend attention on future follow-up images.  Trace pericardial fluid/ thickening.  Pancreatic duct is dilated, with incomplete imaging of the pancreatic head. Calcifications of the pancreatic parenchyma present, potential representing chronic inflammation/ prior pancreatitis. Further evaluation of the pancreas is recommended with contrast-enhanced CT or contrast-enhanced MRI.  These results were called by telephone at the time of interpretation on 10/25/2014 at 1:39 pm to Dr. Rosalie Doctor nurse, Ms Allene Dillon, who verbally acknowledged these results.  Signed,  Dulcy Fanny. Earleen Newport, DO  NUCLEAR MEDICINE PET SKULL BASE TO THIGH  TECHNIQUE: 5.0 mCi F-18 FDG was injected intravenously. Full-ring PET imaging was performed from the skull base to thigh after the radiotracer. CT data was obtained and used for attenuation correction and anatomic localization.  FASTING BLOOD GLUCOSE: Value: 91 mg/dl  COMPARISON: None.  FINDINGS: NECK  No hypermetabolic lymph nodes in the neck.  CHEST  No hypermetabolic mediastinal or hilar nodes. Heart size is normal. Coronary artery calcification is evident. No pericardial effusion.  Lung windows reveal centrilobular emphysema bilaterally. 10 mm spiculated nodule in the posterior left upper lobe is hypermetabolic with SUV max = 2.5. 1.9 cm Ill-defined area of a amorphous soft tissue attenuation is seen in the posteromedial left lower lobe (image 51 series 6) with SUV max = 2.7. 7 mm ground-glass nodule in the posterior right upper lobe on image 24 shows no discernible hypermetabolic FDG uptake on PET imaging. Right lower lobe calcified granulomas seen on image 60 series  6.  ABDOMEN/PELVIS  No abnormal hypermetabolic activity within the liver, adrenal glands, or spleen. There is an area of increased FDG uptake in the head of the pancreas with SUV max = 3.8. CT images show clustered dystrophic calcification in the head and uncinate process of the pancreas at this location. These changes are associated with diffuse distention of the main pancreatic duct and scattered parenchymal calcification in the body and tail of pancreas.  Probable calcified gallstone in the neck of the gallbladder. Dense atherosclerotic calcification is seen in the wall of the abdominal aorta without aneurysm. Surgical mesh is seen in the anterior aspect of the left abdomen there is thickening along the deep rectus fascia of the right anterior abdominal wall with scattered areas of mineralization and possible surgical material. This area shows low level FDG uptake.  Left groin hernia contains only fat.  No hypermetabolic lymph nodes in the abdomen or pelvis.  There is a small focus of hypermetabolism seen along the right wall of the distal rectum with SUV max = 4.7  SKELETON  No focal hypermetabolic activity to suggest skeletal metastasis.  IMPRESSION: 1. 10  mm spiculated posterior left upper lobe nodule is hypermetabolic consistent with neoplasm. 2. 1.9 cm hypermetabolic a amorphous lesion is seen in the posteromedial left lower lobe. Synchronous neoplasm cannot be excluded. 3. Diffuse dystrophic calcification in the pancreatic parenchyma with associated diffuse distention of the main pancreatic duct. Calcification is more focal in the region of the pancreatic head in the same area shows hypermetabolism. The hypermetabolic uptake and calcification in the head of the pancreas may be related to a history of chronic/ongoing inflammation related to chronic pancreatitis. Underlying pancreatic neoplasm involving the head of the pancreas cannot be excluded. 4.  Surgical mesh is seen in the anterior left abdominal wall. There is a larger area of peritoneal thickening associated with the anterior right abdominal wall. This is less definitely related to match and the changes seen on the left side and the area of thickening shows low level FDG uptake. A degree of uptake is within the spectrum of that seen with granulation secondary to scar/healing, but correlation for previous bilateral anterior abdominal wall surgery is recommended as peritoneal or even omental neoplastic disease cannot be completely excluded. 5. Small hypermetabolic focus in the distal rectum/anus. Polyp/neoplasm at this location is a possibility.   Electronically Signed  By: Misty Stanley M.D.  On: 11/17/2014 13:36  PULMONARY FUNCTION TESTS FVC= 2.54 (74%) FEV1= 1.25 (49%) FEV1/FVC= 0.65 FEF 25-75= 0.32 (15%) DLCO= 35% predicted   I personally reviewed the CT and PET/CT and concur with the Radiologists' findings  Impression: 64 yo with a history of heavy tobacco abuse who has 3 nodules in the left lung. The most suspicious lesion is the one in the left upper lobe along the fissure. He has severe COPD with a markedly impaired diffusion capacity. He is not a candidate for lobectomy, even if it turned out to be indicated.  I discussed 2 options with him and his girlfriend. One would be multiple wedge resections. The second would be SBRT. I reviewed the general nature of both of those options, as well as risks, chance of cure. I did inform him of the need to biopsy the lesions if he chooses SBRT, I think the best way to do that would be with ENB, so that all the lesions can be sampled. There would be a relatively high false negative risk due to the relatively small size of the nodules.  He asked about surgical risks. In his case they are substantial and include death, bleeding, infection, air leak, blood clots, as well as the possibility of other complications.  My major  concern is the finding in the head of the pancreas and I think this needs to be evaluated as soon as possible. We will refer to GI for EUS to evaluate the head of the pancreas.  He will think over his options re: wedge resection v SBRT. I will meet with him again after his GI evaluation.  Plan: GI consult  Melrose Nakayama, MD [facility] [phone]  I spent 45 minutes with Mr. Stephens during this visit

## 2014-11-28 ENCOUNTER — Telehealth: Payer: Self-pay | Admitting: *Deleted

## 2014-11-28 NOTE — Telephone Encounter (Signed)
Oncology Nurse Navigator Documentation  Oncology Nurse Navigator Flowsheets 11/28/2014  Navigator Encounter Type Other/Phone call   Patient Visit Type Follow-up/Girl friend called today and wanted an update regarding referral.  I stated I spoke with scheduling today to expidite appt.  She was thankful for the update.  I called scheduling again to check on appt. I left vm message with scheduling   Treatment Phase Abnormal Scans  Coordination of Care MD Appointments  Time Spent with Patient 30

## 2014-11-29 ENCOUNTER — Other Ambulatory Visit: Payer: Self-pay

## 2014-11-29 ENCOUNTER — Encounter: Payer: Self-pay | Admitting: *Deleted

## 2014-11-29 ENCOUNTER — Telehealth: Payer: Self-pay

## 2014-11-29 ENCOUNTER — Other Ambulatory Visit: Payer: Self-pay | Admitting: *Deleted

## 2014-11-29 DIAGNOSIS — R932 Abnormal findings on diagnostic imaging of liver and biliary tract: Secondary | ICD-10-CM

## 2014-11-29 DIAGNOSIS — K8689 Other specified diseases of pancreas: Secondary | ICD-10-CM

## 2014-11-29 NOTE — Telephone Encounter (Signed)
He needs upper EUS, radial +/- linear, next available EUS Thursday with MAC for abnormal pancreas         Thanks                ----- Message -----     From: Barron Alvine, CMA     Sent: 11/24/2014  4:45 PM      To: Milus Banister, MD    Subject: Please review for EUS                    Please review for EUS        ----- Message -----     From: Marvel Plan     Sent: 11/24/2014  4:37 PM      To: Barron Alvine, [place],        Please see below. Thanks.         [name]        ----- Message -----     From: Hulan Saas, RN     Sent: 11/24/2014  4:28 PM      To: Marvel Plan        Needs to be sent to Dr. Ardis Hughs to review.    He is the only MD that does this.    Rollene Fare        ----- Message -----     From: Marvel Plan     Sent: 11/24/2014  4:05 PM      To: Hulan Saas, RN        Doc of Day        Referral in [facility] for EUS to evaluate head of pancreas. Advise for scheduling. Appears to have no GI hx in EPIC.

## 2014-11-29 NOTE — Progress Notes (Signed)
Oncology Nurse Navigator Documentation  Oncology Nurse Navigator Flowsheets 11/29/2014  Navigator Encounter Type Other  Treatment Phase Pre-tx  Barriers/Navigation Needs Patient needs GI work up.  Unfortunately, this referral has still not been made.  I re-ordered with POF placed  Coordination of Care MD Appointments  Time Spent with Patient 15

## 2014-11-29 NOTE — Telephone Encounter (Signed)
Instructions mailed to the home

## 2014-11-29 NOTE — Telephone Encounter (Signed)
Left message on machine to call back  

## 2014-11-29 NOTE — Telephone Encounter (Signed)
Pt has been scheduled for EUS on 12/14/14

## 2014-11-30 DIAGNOSIS — R972 Elevated prostate specific antigen [PSA]: Secondary | ICD-10-CM | POA: Diagnosis not present

## 2014-11-30 DIAGNOSIS — N5201 Erectile dysfunction due to arterial insufficiency: Secondary | ICD-10-CM | POA: Diagnosis not present

## 2014-11-30 NOTE — Telephone Encounter (Signed)
EUS scheduled, pt instructed and medications reviewed.  Patient instructions mailed to home.  Patient to call with any questions or concerns.  

## 2014-12-08 ENCOUNTER — Encounter (HOSPITAL_COMMUNITY): Payer: Self-pay | Admitting: *Deleted

## 2014-12-13 ENCOUNTER — Telehealth: Payer: Self-pay | Admitting: *Deleted

## 2014-12-13 NOTE — Anesthesia Preprocedure Evaluation (Addendum)
Anesthesia Evaluation  Patient identified by MRN, date of birth, ID band Patient awake    Reviewed: Allergy & Precautions, NPO status , Patient's Chart, lab work & pertinent test results  History of Anesthesia Complications Negative for: history of anesthetic complications  Airway Mallampati: I  TM Distance: >3 FB Neck ROM: Full    Dental   Pulmonary COPD (takes albuterol, chantix, welbutrin.. copd is severe with PFTs showing severe range values, he is not a candidate for lung resection even if it were indicated) COPD inhaler, Current Smoker (100 pack years smoking),  + rhonchi   + decreased breath sounds+ wheezing      Cardiovascular hypertension, Normal cardiovascular exam    Neuro/Psych    GI/Hepatic Abnormal pancreas requiring upper EUS, s/p 2 stents placed to keep pancreatic duct patent -ischemic bowel disease in past   Endo/Other    Renal/GU      Musculoskeletal   Abdominal   Peds  Hematology   Anesthesia Other Findings   Reproductive/Obstetrics                           Anesthesia Physical Anesthesia Plan  ASA: III  Anesthesia Plan: MAC   Post-op Pain Management:    Induction: Intravenous  Airway Management Planned: Natural Airway  Additional Equipment:   Intra-op Plan:   Post-operative Plan:   Informed Consent: I have reviewed the patients History and Physical, chart, labs and discussed the procedure including the risks, benefits and alternatives for the proposed anesthesia with the patient or authorized representative who has indicated his/her understanding and acceptance.     Plan Discussed with: CRNA  Anesthesia Plan Comments: (Patient is at risk for pulmonary problems at baseline related to severe COPD, functional limitations and smoking history)        Anesthesia Quick Evaluation

## 2014-12-13 NOTE — Telephone Encounter (Signed)
Oncology Nurse Navigator Documentation  Oncology Nurse Navigator Flowsheets 12/13/2014  Navigator Encounter Type Telephone/I called to follow up regarding appt.  He is having his endo on 12/14/14.  I called and set him up to have follow up with Dr. Roxan Hockey on 12/21/14.  I left vm message regarding appt  Patient Visit Type Follow-up  Treatment Phase Abnormal Scans  Coordination of Care MD Appointments  Time Spent with Patient 15

## 2014-12-14 ENCOUNTER — Ambulatory Visit (HOSPITAL_COMMUNITY)
Admission: RE | Admit: 2014-12-14 | Discharge: 2014-12-14 | Disposition: A | Discharge status: Home / Self Care | Payer: Medicare Other | Source: Ambulatory Visit | Attending: Gastroenterology | Admitting: Gastroenterology

## 2014-12-14 ENCOUNTER — Encounter (HOSPITAL_COMMUNITY)
Admission: RE | Disposition: A | Discharge status: Home / Self Care | Payer: Self-pay | Source: Ambulatory Visit | Attending: Gastroenterology

## 2014-12-14 ENCOUNTER — Ambulatory Visit (HOSPITAL_COMMUNITY): Payer: Medicare Other | Admitting: Anesthesiology

## 2014-12-14 ENCOUNTER — Encounter (HOSPITAL_COMMUNITY): Payer: Self-pay

## 2014-12-14 DIAGNOSIS — K559 Vascular disorder of intestine, unspecified: Secondary | ICD-10-CM | POA: Diagnosis not present

## 2014-12-14 DIAGNOSIS — J45909 Unspecified asthma, uncomplicated: Secondary | ICD-10-CM | POA: Insufficient documentation

## 2014-12-14 DIAGNOSIS — K861 Other chronic pancreatitis: Secondary | ICD-10-CM

## 2014-12-14 DIAGNOSIS — R918 Other nonspecific abnormal finding of lung field: Secondary | ICD-10-CM | POA: Insufficient documentation

## 2014-12-14 DIAGNOSIS — J449 Chronic obstructive pulmonary disease, unspecified: Secondary | ICD-10-CM | POA: Diagnosis not present

## 2014-12-14 DIAGNOSIS — K859 Acute pancreatitis, unspecified: Secondary | ICD-10-CM | POA: Diagnosis not present

## 2014-12-14 DIAGNOSIS — Z79899 Other long term (current) drug therapy: Secondary | ICD-10-CM | POA: Insufficient documentation

## 2014-12-14 DIAGNOSIS — I10 Essential (primary) hypertension: Secondary | ICD-10-CM | POA: Insufficient documentation

## 2014-12-14 DIAGNOSIS — R932 Abnormal findings on diagnostic imaging of liver and biliary tract: Secondary | ICD-10-CM

## 2014-12-14 DIAGNOSIS — F17210 Nicotine dependence, cigarettes, uncomplicated: Secondary | ICD-10-CM | POA: Diagnosis not present

## 2014-12-14 HISTORY — PX: EUS: SHX5427

## 2014-12-14 SURGERY — UPPER ENDOSCOPIC ULTRASOUND (EUS) LINEAR
Anesthesia: Monitor Anesthesia Care

## 2014-12-14 MED ORDER — KETAMINE HCL 10 MG/ML IJ SOLN
INTRAMUSCULAR | Status: DC | PRN
  Administered 2014-12-14: 20 mg via INTRAVENOUS

## 2014-12-14 MED ORDER — PROPOFOL 10 MG/ML IV BOLUS
INTRAVENOUS | Status: AC
  Filled 2014-12-14: qty 20

## 2014-12-14 MED ORDER — ALBUTEROL SULFATE (2.5 MG/3ML) 0.083% IN NEBU
2.5000 mg | INHALATION_SOLUTION | Freq: Once | RESPIRATORY_TRACT | Status: AC
  Administered 2014-12-14: 2.5 mg via RESPIRATORY_TRACT

## 2014-12-14 MED ORDER — PROPOFOL INFUSION 10 MG/ML OPTIME
INTRAVENOUS | Status: DC | PRN
  Administered 2014-12-14: 300 ug/kg/min via INTRAVENOUS

## 2014-12-14 MED ORDER — IPRATROPIUM BROMIDE 0.02 % IN SOLN
0.5000 mg | Freq: Once | RESPIRATORY_TRACT | Status: AC
  Administered 2014-12-14: 0.5 mg via RESPIRATORY_TRACT
  Filled 2014-12-14: qty 2.5

## 2014-12-14 MED ORDER — ONDANSETRON HCL 4 MG/2ML IJ SOLN
INTRAMUSCULAR | Status: DC | PRN
  Administered 2014-12-14: 4 mg via INTRAVENOUS

## 2014-12-14 MED ORDER — ONDANSETRON HCL 4 MG/2ML IJ SOLN
INTRAMUSCULAR | Status: AC
  Filled 2014-12-14: qty 2

## 2014-12-14 MED ORDER — LIDOCAINE HCL (CARDIAC) 20 MG/ML IV SOLN
INTRAVENOUS | Status: DC | PRN
  Administered 2014-12-14: 100 mg via INTRAVENOUS

## 2014-12-14 MED ORDER — BUTAMBEN-TETRACAINE-BENZOCAINE 2-2-14 % EX AERO
INHALATION_SPRAY | CUTANEOUS | Status: DC | PRN
  Administered 2014-12-14: 1 via TOPICAL

## 2014-12-14 MED ORDER — SODIUM CHLORIDE 0.9 % IV SOLN: INTRAVENOUS | Status: DC

## 2014-12-14 MED ORDER — LIDOCAINE HCL (CARDIAC) 20 MG/ML IV SOLN
INTRAVENOUS | Status: AC
  Filled 2014-12-14: qty 5

## 2014-12-14 MED ORDER — ALBUTEROL SULFATE (2.5 MG/3ML) 0.083% IN NEBU
INHALATION_SOLUTION | RESPIRATORY_TRACT | Status: AC
  Filled 2014-12-14: qty 3

## 2014-12-14 MED ORDER — LACTATED RINGERS IV SOLN
INTRAVENOUS | Status: DC
Start: 2014-12-14 — End: 2014-12-14
  Administered 2014-12-14: 1000 mL via INTRAVENOUS

## 2014-12-14 NOTE — Discharge Instructions (Addendum)

## 2014-12-14 NOTE — Interval H&P Note (Signed)
History and Physical Interval Note:  12/14/2014 9:00 AM  [name]  has presented today for surgery, with the diagnosis of abnormal pancreas  The various methods of treatment have been discussed with the patient and family. After consideration of risks, benefits and other options for treatment, the patient has consented to  Procedure(s): UPPER ENDOSCOPIC ULTRASOUND (EUS) LINEAR (N/A) as a surgical intervention .  The patient's history has been reviewed, patient examined, no change in status, stable for surgery.  I have reviewed the patient's chart and labs.  Questions were answered to the patient's satisfaction.     Milus Banister

## 2014-12-14 NOTE — H&P (View-Only) (Signed)
[address]       [place] [address]             [phone]      PCP is [name], MD Referring Provider is [name], MD  No chief complaint on file.   HPI: 64 yo man presents with a cc/o lung nodules.  [name] is a 64 yo man with a history of heavy tobacco abuse, 2 ppd x 50 years. He is currently smoking about 0.5 ppd as he is trying to quit. 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  He recently had a physical with Dr. [name]. A chest x-ray showed a left lung nodule. A CT showed multiple lung nodules. A PET showed hypermetabolic activity in 3 nodules in the left lung. There is an 8 mm nodule at the apex with mild hypermetabolism, a 9 mm nodule in the left upper lobe along the fissure with significant activity given the size of the lesion, and an amorphous area in the left lower lobe with low grade activity. There was no hilar or mediastinal activity. There was an area of increased metabolic activity in the head of the pancreas in a mass lesion with calcifications.  He is disabled from work but remains relatively active with yard work. He gets SOB with activity but can walk up a flight of stairs without stopping. He would be short of breath. He denies chest pain, pressure or tightness. He has a chronic cough, denies hemoptysis. He has lost 5 pounds in the last 3 months. Now weighs 92 pounds. Denies abdominal pain, N/V.  Past Medical History  Diagnosis Date  . Hypertension   . COPD (chronic obstructive pulmonary disease)   . Status post left foot surgery   . Ischemic bowel disease   Pancreatitis- ? Pancreatic duct stent  Past Surgical History  Procedure Laterality Date  . Colectomy    . Colostomy    . Colostomy reversal    .  Ventral hernia repair      Family History  Problem Relation Age of Onset  . COPD Mother   . Diabetes Father   . Cancer Brother     metastatic, unknown primary    Social History History  Substance Use Topics  . Smoking status: Current Every Day Smoker -- 2.00 packs/day for 50 years    Types: Cigarettes  . Smokeless tobacco: Not on file     Comment: currently 0.5 ppd, trying to quit  . Alcohol Use: 2.4 oz/week    4 Cans of beer per week     Comment: per day    Current Outpatient Prescriptions  Medication Sig Dispense Refill  . albuterol (PROVENTIL) (2.5 MG/3ML) 0.083% nebulizer solution Take 2.5 mg by nebulization every 6 (six) hours as needed for wheezing or shortness of breath.    Marland Kitchen amLODipine (NORVASC) 10 MG tablet Take 10 mg by mouth daily.    Marland Kitchen buPROPion (WELLBUTRIN XL) 150 MG 24 hr tablet Take 150 mg by mouth daily.    . CHANTIX 1 MG tablet     . CHANTIX STARTING MONTH PAK 0.5 MG X 11 & 1 MG X 42 tablet     . metoprolol succinate (TOPROL-XL) 25 MG 24 hr tablet Take 25 mg by mouth daily.    Marland Kitchen  PROAIR HFA 108 (90 BASE) MCG/ACT inhaler Inhale 90 mcg into the lungs as needed.     No current facility-administered medications for this visit.    Not on File  Review of Systems  Constitutional: Positive for unexpected weight change (lost 5 pounds in 3 months). Negative for fever, chills, activity change and appetite change.  HENT: Negative for dental problem.   Eyes: Negative for photophobia and visual disturbance.  Respiratory: Positive for cough (productive, no hemoptysis), shortness of breath and wheezing. Negative for chest tightness.   Cardiovascular: Negative for chest pain, palpitations and leg swelling.       Poor circulation in legs  Gastrointestinal: Negative for nausea, vomiting, abdominal pain, diarrhea, constipation, blood in stool and abdominal distention.  Endocrine: Negative for polydipsia, polyphagia and polyuria.  Genitourinary: Negative for dysuria and  hematuria.  Musculoskeletal: Positive for arthralgias.       Disabled secondary to broken feet  Neurological: Negative for dizziness, seizures, syncope, weakness and headaches.  Hematological: Negative for adenopathy. Does not bruise/bleed easily.  Psychiatric/Behavioral: Positive for dysphoric mood. The patient is nervous/anxious.   All other systems reviewed and are negative.   There were no vitals taken for this visit. Physical Exam  Constitutional: He is oriented to person, place, and time. No distress.  Thin  HENT:  Head: Normocephalic and atraumatic.  Eyes: EOM are normal. Pupils are equal, round, and reactive to light. No scleral icterus.  Neck: Neck supple. No tracheal deviation present. No thyromegaly present.  Cardiovascular: Normal rate, regular rhythm, normal heart sounds and intact distal pulses.  Exam reveals no gallop and no friction rub.   No murmur heard. Pulmonary/Chest: Effort normal.  Diminished BS bilaterally  Abdominal: Soft. Bowel sounds are normal. There is no tenderness.  Deformity from prior surgeries, scars healed  Musculoskeletal: Normal range of motion. He exhibits no edema.  Lymphadenopathy:    He has no cervical adenopathy.  Neurological: He is alert and oriented to person, place, and time. No cranial nerve deficit.  No focal motor deficit  Skin: Skin is warm and dry.  Psychiatric: He has a normal mood and affect.  Vitals reviewed.    Diagnostic Tests: CT CHEST WITH CONTRAST  TECHNIQUE: Multidetector CT imaging of the chest was performed during intravenous contrast administration.  CONTRAST: 65m OMNIPAQUE IOHEXOL 300 MG/ML SOLN  COMPARISON: None.  FINDINGS: Chest:  Unremarkable superficial soft tissues of the chest wall.  Bilateral axillary lymph nodes. Largest on the left measures 6 mm. No supraclavicular adenopathy. No scalene adenopathy.  Unremarkable appearance of the thoracic inlet. Unremarkable thyroid.  Lymph  nodes within multiple mediastinal nodal stations, none of which are enlarged by CT size criteria.  Heart size within normal limits. Trace pericardial fluid/ thickening.  Calcifications of left main, left anterior descending, circumflex, right coronary arteries.  Atherosclerotic changes of the thoracic aorta. No aneurysm or dissection flap. No periaortic fluid.  Unremarkable appearance of pulmonary artery. No central, lobar, segmental, or proximal subsegmental filling defects.  Extensive paraseptal/ centrilobular emphysema the bilateral lungs with hyperinflation.  Minimal architectural distortion/ scarring of the lungs. No confluent airspace disease, pneumothorax, or pleural effusion.  Spiculated nodule of the left upper lobe, abutting the minor fissure measuring 1 cm on image 26 of series 5.  8 mm to 9 mm nodule of the left apex involving the visceral pleura. Partially calcified nodule of the right lower lobe adjacent to the major fissure, likely a granuloma.  No significant bronchial wall thickening. Central airways are clear.  Upper abdomen:  Unremarkable liver and spleen.  Small hiatal hernia.  Calcifications of the pancreas. Mild dilation of the pancreatic duct within the pancreatic body. Calcifications at the pancreatic head.  Unremarkable gallbladder.  Unremarkable appearance of the bilateral kidneys.  Unremarkable appearance of the visualized small bowel and colon.  Calcifications of abdominal aorta.  No displaced fracture. Degenerative changes of the visualized spine. Sclerotic focus of T10 posterior rib vertebral body.  IMPRESSION: Suspicious spiculated nodule of the left upper lobe measuring 1 cm abutting the major fissure. Further evaluation with PET-CT is recommended.  Nonenlarged mediastinal and axillary lymph nodes, potentially reactive.  Nodule of the left apex extending to the visceral pleura measures 8 mm to 9 mm.  Recommend attention on future follow-up studies.  Extensive paraseptal/centrilobular emphysema.  Architectural distortion with ill-defined nodularity along the medial aspect of the left lower lobe, potentially inflammatory/scarring. Recommend attention on future follow-up images.  Trace pericardial fluid/ thickening.  Pancreatic duct is dilated, with incomplete imaging of the pancreatic head. Calcifications of the pancreatic parenchyma present, potential representing chronic inflammation/ prior pancreatitis. Further evaluation of the pancreas is recommended with contrast-enhanced CT or contrast-enhanced MRI.  These results were called by telephone at the time of interpretation on 10/25/2014 at 1:39 pm to Dr. Rosalie Doctor nurse, Ms Allene Dillon, who verbally acknowledged these results.  Signed,  Dulcy Fanny. Earleen Newport, DO  NUCLEAR MEDICINE PET SKULL BASE TO THIGH  TECHNIQUE: 5.0 mCi F-18 FDG was injected intravenously. Full-ring PET imaging was performed from the skull base to thigh after the radiotracer. CT data was obtained and used for attenuation correction and anatomic localization.  FASTING BLOOD GLUCOSE: Value: 91 mg/dl  COMPARISON: None.  FINDINGS: NECK  No hypermetabolic lymph nodes in the neck.  CHEST  No hypermetabolic mediastinal or hilar nodes. Heart size is normal. Coronary artery calcification is evident. No pericardial effusion.  Lung windows reveal centrilobular emphysema bilaterally. 10 mm spiculated nodule in the posterior left upper lobe is hypermetabolic with SUV max = 2.5. 1.9 cm Ill-defined area of a amorphous soft tissue attenuation is seen in the posteromedial left lower lobe (image 51 series 6) with SUV max = 2.7. 7 mm ground-glass nodule in the posterior right upper lobe on image 24 shows no discernible hypermetabolic FDG uptake on PET imaging. Right lower lobe calcified granulomas seen on image 60 series  6.  ABDOMEN/PELVIS  No abnormal hypermetabolic activity within the liver, adrenal glands, or spleen. There is an area of increased FDG uptake in the head of the pancreas with SUV max = 3.8. CT images show clustered dystrophic calcification in the head and uncinate process of the pancreas at this location. These changes are associated with diffuse distention of the main pancreatic duct and scattered parenchymal calcification in the body and tail of pancreas.  Probable calcified gallstone in the neck of the gallbladder. Dense atherosclerotic calcification is seen in the wall of the abdominal aorta without aneurysm. Surgical mesh is seen in the anterior aspect of the left abdomen there is thickening along the deep rectus fascia of the right anterior abdominal wall with scattered areas of mineralization and possible surgical material. This area shows low level FDG uptake.  Left groin hernia contains only fat.  No hypermetabolic lymph nodes in the abdomen or pelvis.  There is a small focus of hypermetabolism seen along the right wall of the distal rectum with SUV max = 4.7  SKELETON  No focal hypermetabolic activity to suggest skeletal metastasis.  IMPRESSION: 1. 10  mm spiculated posterior left upper lobe nodule is hypermetabolic consistent with neoplasm. 2. 1.9 cm hypermetabolic a amorphous lesion is seen in the posteromedial left lower lobe. Synchronous neoplasm cannot be excluded. 3. Diffuse dystrophic calcification in the pancreatic parenchyma with associated diffuse distention of the main pancreatic duct. Calcification is more focal in the region of the pancreatic head in the same area shows hypermetabolism. The hypermetabolic uptake and calcification in the head of the pancreas may be related to a history of chronic/ongoing inflammation related to chronic pancreatitis. Underlying pancreatic neoplasm involving the head of the pancreas cannot be excluded. 4.  Surgical mesh is seen in the anterior left abdominal wall. There is a larger area of peritoneal thickening associated with the anterior right abdominal wall. This is less definitely related to match and the changes seen on the left side and the area of thickening shows low level FDG uptake. A degree of uptake is within the spectrum of that seen with granulation secondary to scar/healing, but correlation for previous bilateral anterior abdominal wall surgery is recommended as peritoneal or even omental neoplastic disease cannot be completely excluded. 5. Small hypermetabolic focus in the distal rectum/anus. Polyp/neoplasm at this location is a possibility.   Electronically Signed  By: Misty Stanley M.D.  On: 11/17/2014 13:36  PULMONARY FUNCTION TESTS FVC= 2.54 (74%) FEV1= 1.25 (49%) FEV1/FVC= 0.65 FEF 25-75= 0.32 (15%) DLCO= 35% predicted   I personally reviewed the CT and PET/CT and concur with the Radiologists' findings  Impression: 64 yo with a history of heavy tobacco abuse who has 3 nodules in the left lung. The most suspicious lesion is the one in the left upper lobe along the fissure. He has severe COPD with a markedly impaired diffusion capacity. He is not a candidate for lobectomy, even if it turned out to be indicated.  I discussed 2 options with him and his girlfriend. One would be multiple wedge resections. The second would be SBRT. I reviewed the general nature of both of those options, as well as risks, chance of cure. I did inform him of the need to biopsy the lesions if he chooses SBRT, I think the best way to do that would be with ENB, so that all the lesions can be sampled. There would be a relatively high false negative risk due to the relatively small size of the nodules.  He asked about surgical risks. In his case they are substantial and include death, bleeding, infection, air leak, blood clots, as well as the possibility of other complications.  My major  concern is the finding in the head of the pancreas and I think this needs to be evaluated as soon as possible. We will refer to GI for EUS to evaluate the head of the pancreas.  He will think over his options re: wedge resection v SBRT. I will meet with him again after his GI evaluation.  Plan: GI consult  Melrose Nakayama, MD [facility] [phone]  I spent 45 minutes with [name] during this visit

## 2014-12-14 NOTE — Op Note (Signed)
[place], [address]   ENDOSCOPIC ULTRASOUND PROCEDURE REPORT  PATIENT: [name], [name]  MR#: [id number] BIRTHDATE: 1950-07-23  GENDER: male ENDOSCOPIST: Milus Banister, MD REFERRED BY:  Merilynn Finland, MD PROCEDURE DATE:  12/14/2014 PROCEDURE:   Upper EUS w/FNA and Upper EUS ASA CLASS:      Class III INDICATIONS:   1.  lung nodules, suspicious for malignancy; abnormal pancreas on PET/CT; previous heavy etoh abuse, underwent several procedures that sound like ERCPs in [place] 8-10 years ago. MEDICATIONS: Monitored anesthesia care  DESCRIPTION OF PROCEDURE:   After the risks benefits and alternatives of the procedure were  explained, informed consent was obtained. The patient was then placed in the left, lateral, decubitus postion and IV sedation was administered. Throughout the procedure, the patients blood pressure, pulse and oxygen saturations were monitored continuously.  Under direct visualization, the EUS scope and Pentax EUS Linear [id number] endoscope was introduced through the mouth  and advanced to the second portion of the duodenum .  Water was used as necessary to provide an acoustic interface.  Upon completion of the imaging, water was removed and the patient was sent to the recovery room in satisfactory condition.  Endoscopic findings: 1. Normal UGI tract  EUS findings: 1. Pancreatic parenchyma ws diffusely abnormal throughout the gland. There scattered shadowing calcifications throughout.  There were no discrete soft tissue masses within the pancreas. 2. Main pancreatic duct was ectatic, mildly dilated 3. No peripancreatic adenopathy 4. 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 He relates previous heavy alcohol abuse and previous procedures which sound like ERCPs (all in [place] 8-10 years ago) and so his chronic pancreatitis is almost certainly  alcohol related.  Given that there is no obvious mass on this exam, it is most likely that the PET findings are due to chronic infllamation.  RECOMMENDATIONS: Continue with lung cancer treatment options, per Dr. Roxan Hockey  _______________________________ eSigned:  Milus Banister, MD 12/14/2014 9:45 AM   CC:

## 2014-12-14 NOTE — Transfer of Care (Signed)
Immediate Anesthesia Transfer of Care Note  Patient: [name]  Procedure(s) Performed: Procedure(s): UPPER ENDOSCOPIC ULTRASOUND (EUS) LINEAR (N/A)  Patient Location: PACU  Anesthesia Type:MAC  Level of Consciousness:  sedated, patient cooperative and responds to stimulation  Airway & Oxygen Therapy:Patient Spontanous Breathing and Patient connected to face mask oxgen  Post-op Assessment:  Report given to PACU RN and Post -op Vital signs reviewed and stable  Post vital signs:  Reviewed and stable  Last Vitals:  Filed Vitals:   12/14/14 0748  BP: 138/72  Pulse: 62  Temp: 36.5 C  Resp: 17    Complications: No apparent anesthesia complications

## 2014-12-14 NOTE — Anesthesia Postprocedure Evaluation (Signed)
  Anesthesia Post-op Note  Patient: [name]  Procedure(s) Performed: Procedure(s) (LRB): UPPER ENDOSCOPIC ULTRASOUND (EUS) LINEAR (N/A)  Patient Location: PACU  Anesthesia Type: MAC  Level of Consciousness: awake and alert   Airway and Oxygen Therapy: Patient Spontanous Breathing  Post-op Pain: mild  Post-op Assessment: Post-op Vital signs reviewed, Patient's Cardiovascular Status Stable, Respiratory Function Stable, Patent Airway and No signs of Nausea or vomiting  Last Vitals:  Filed Vitals:   12/14/14 1010  BP: 143/76  Pulse: 64  Temp:   Resp: 13    Post-op Vital Signs: stable   Complications: No apparent anesthesia complications

## 2014-12-15 ENCOUNTER — Encounter (HOSPITAL_COMMUNITY): Payer: Self-pay | Admitting: Gastroenterology

## 2014-12-21 ENCOUNTER — Ambulatory Visit
Admission: RE | Admit: 2014-12-21 | Discharge: 2014-12-21 | Disposition: A | Discharge status: Home / Self Care | Payer: Medicare Other | Source: Ambulatory Visit | Attending: Radiation Oncology | Admitting: Radiation Oncology

## 2014-12-21 ENCOUNTER — Encounter: Payer: Self-pay | Admitting: Thoracic Surgery (Cardiothoracic Vascular Surgery)

## 2014-12-21 ENCOUNTER — Ambulatory Visit: Payer: Medicare Other | Attending: Internal Medicine | Admitting: Physical Therapy

## 2014-12-21 ENCOUNTER — Ambulatory Visit (INDEPENDENT_AMBULATORY_CARE_PROVIDER_SITE_OTHER): Payer: Medicare Other | Admitting: Thoracic Surgery (Cardiothoracic Vascular Surgery)

## 2014-12-21 ENCOUNTER — Other Ambulatory Visit: Payer: Self-pay | Admitting: *Deleted

## 2014-12-21 DIAGNOSIS — R918 Other nonspecific abnormal finding of lung field: Secondary | ICD-10-CM

## 2014-12-21 DIAGNOSIS — M2560 Stiffness of unspecified joint, not elsewhere classified: Secondary | ICD-10-CM | POA: Diagnosis not present

## 2014-12-21 DIAGNOSIS — K869 Disease of pancreas, unspecified: Secondary | ICD-10-CM

## 2014-12-21 DIAGNOSIS — R0602 Shortness of breath: Secondary | ICD-10-CM | POA: Diagnosis not present

## 2014-12-21 DIAGNOSIS — K8689 Other specified diseases of pancreas: Secondary | ICD-10-CM

## 2014-12-21 DIAGNOSIS — C3412 Malignant neoplasm of upper lobe, left bronchus or lung: Secondary | ICD-10-CM | POA: Insufficient documentation

## 2014-12-21 DIAGNOSIS — R293 Abnormal posture: Secondary | ICD-10-CM | POA: Diagnosis not present

## 2014-12-21 NOTE — Therapy (Signed)
[place], [place], [address] Phone: [phone]   Fax:  [phone]  Physical Therapy Evaluation  Patient Details  Name: [name] MRN: [id number] Date of Birth: 1950/07/04 Referring Provider:  [name], MD  Encounter Date: 12/21/2014      PT End of Session - 12/21/14 1545    Visit Number 1   Number of Visits 1   PT Start Time 5974   PT Stop Time 1537   PT Time Calculation (min) 22 min   Activity Tolerance Patient tolerated treatment well   Behavior During Therapy [facility] for tasks assessed/performed      Past Medical History  Diagnosis Date  . Hypertension   . COPD (chronic obstructive pulmonary disease)   . Status post left foot surgery   . Ischemic bowel disease   . 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 Colectomy    . Colostomy    . Colostomy reversal    . Ventral hernia repair    . Foot surgery Left   . Upper gi endoscopy    . Eus N/A 12/14/2014    Procedure: UPPER ENDOSCOPIC ULTRASOUND (EUS) LINEAR;  Surgeon: Milus Banister, MD;  Location: [facility] ENDOSCOPY;  Service: Endoscopy;  Laterality: N/A;    There were no vitals filed for this visit.  Visit Diagnosis:  Poor posture - Plan: PT plan of care cert/re-cert  Shortness of breath on exertion - Plan: PT plan of care cert/re-cert  Joint stiffness of spine - Plan: PT plan of care cert/re-cert      Subjective Assessment - 12/21/14 1536    Subjective No complaints except shortness of breath with ambulation, especially walking uphill.   Patient is accompained by: Family member   Pertinent History Incidental finding of lung nodules on routine physical and chest x-ray.  Then had CT and PET; found 2 hypermetabolic left lower lobe nodules and hypermetabolic head of pancreas; also left upper lobe nodule but no lymphnodes seen on PET.  Pt. to have  needle biopsy and then SBRT.  Current smoker (100 pack-years), COPD, HTN, pancreatitis, h/o ischemic bowel disease, h/o left foot surgery.   Patient Stated Goals none   Currently in Pain? No/denies            [facility] PT Assessment - 12/21/14 0001    Assessment   Medical Diagnosis several hypermetabolic lung nodules; no pathology to date   Onset Date/Surgical Date 10/25/14   Precautions   Precautions Other (comment)  cancer precautions   Restrictions   Weight Bearing Restrictions No   Balance Screen   Has the patient fallen in the past 6 months Yes   How many times? once  a fluke, while playing around   Has the patient had a decrease in activity level because of a fear of falling?  No   Is the patient reluctant to leave their home because of a fear of falling?  No   Home Environment   Living Environment Private residence   Living Arrangements Spouse/significant other   Type of [place] One level   Prior Function   Level of [place] Other (comment)  not working--unknown if disability or other reason   Leisure no regular exercise   Cognition   Overall Cognitive Status Within Functional Limits for tasks assessed   Observation/Other Assessments  Observations small, thin man   Functional Tests   Functional tests Sit to Stand   Sit to Stand   Comments 14 times in 30 seconds   Posture/Postural Control   Posture/Postural Control Postural limitations   Postural Limitations Rounded Shoulders;Forward head   ROM / Strength   AROM / PROM / Strength AROM   AROM   Overall AROM Comments trunk AROM in standing WFL for all directions except extension shows 50% loss   Ambulation/Gait   Ambulation/Gait Yes   Ambulation/Gait Assistance 6: Modified independent (Device/Increase time)  reports some limitation with shortness of breath   Balance   Balance Assessed Yes   Dynamic Standing Balance   Dynamic Standing - Comments reaches 14 inches  forward in standing, about average for his age                           PT Education - 01-Jan-2015 1545    Education provided Yes   Education Details posture, breathing, walking or other endurance exercise, energy conservation, PT info, Cure article on exercise during treatment   Person(s) Educated Patient;Spouse   Methods Explanation;Handout   Comprehension Verbalized understanding               Lung Clinic Goals - January 01, 2015 1638    Patient will be able to verbalize understanding of the benefit of exercise to decrease fatigue.   Status Achieved   Patient will be able to verbalize the importance of posture.   Status Achieved   Patient will be able to demonstrate diaphragmatic breathing for improved lung function.   Status Achieved   Patient will be able to verbalize understanding of the role of physical therapy to prevent functional decline and who to contact if physical therapy is needed.   Status Achieved             Plan - 01-01-2015 1546    Clinical Impression Statement Patient with significant forward head, rounded shoulder posture and with reported exertional shortness of breath undergoing biopsy soon to determine the nature of lung nodules found through scans.  He expects to have SBRT for treatment rather than surgery.   He does have limited active lumbar extension.                                                              Pt will benefit from skilled therapeutic intervention in order to improve on the following deficits Impaired flexibility;Cardiopulmonary status limiting activity;Decreased endurance;Postural dysfunction   Rehab Potential Good   PT Frequency One time visit   PT Treatment/Interventions Patient/family education   PT Next Visit Plan None at this time; patient may benefit from therapy going forward for improving functional endurance and posture.   PT Home Exercise Plan see education section   Consulted and Agree with Plan of Care  Patient          G-Codes - Jan 01, 2015 1638    Functional Assessment Tool Used clinical judgement   Functional Limitation Changing and maintaining body position   Changing and Maintaining Body Position Current Status (I4580) At least 20 percent but less than 40 percent impaired, limited or restricted   Changing and Maintaining Body Position Goal Status (D9833) At least 20 percent but less than 40  percent impaired, limited or restricted   Changing and Maintaining Body Position Discharge Status [phone]) At least 20 percent but less than 40 percent impaired, limited or restricted       Problem List Patient Active Problem List   Diagnosis Date Noted  . Multiple lung nodules 12/21/2014  . Pancreatic mass 11/23/2014    [name] 12/21/2014, 4:44 PM  [place], [place], [address] Phone: [phone]   Fax:  [place], PT 12/21/2014 4:44 PM

## 2014-12-21 NOTE — Progress Notes (Signed)
[address]       [place] [address]             [phone]       HPI: Mr. Uptain returns to further discuss work up and treatment of multiple lung nodules.  He is a 64 yo man with a history of heavy tobacco abuse, 2 ppd x 50 years. He is currently smoking about 0.5 ppd as he is trying to quit. He tried taking Chantix but had to stop it due to side effects. 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   He recently had a physical with Dr. [name]. A chest x-ray showed a left lung nodule. A CT showed multiple lung nodules. A PET showed hypermetabolic activity in 3 nodules in the left lung. There is an 8 mm nodule at the apex with mild hypermetabolism, a 9 mm nodule in the left upper lobe along the fissure with significant activity given the size of the lesion, and an amorphous area in the left lower lobe with low grade activity. There was no hilar or mediastinal activity. There was an area of increased metabolic activity in the head of the pancreas in a mass lesion with calcifications.  He had an EUS by Dr. Ardis Hughs which showed the pancreatic findings were consistent with chronic pancreatitis.  Past Medical History  Diagnosis Date  . Hypertension   . COPD (chronic obstructive pulmonary disease)   . Status post left foot surgery   . Ischemic bowel disease   . Pancreatitis     x2 stents placed to make patent duct to pancreas      Current Outpatient Prescriptions  Medication Sig Dispense Refill  . albuterol (PROVENTIL) (2.5 MG/3ML) 0.083% nebulizer solution Take 2.5 mg by nebulization every 6 (six) hours as needed for wheezing or shortness of breath.    Marland Kitchen amLODipine (NORVASC) 10 MG tablet Take 10 mg by mouth every morning.     Marland Kitchen buPROPion (WELLBUTRIN XL) 150 MG 24 hr tablet Take 150 mg by mouth every morning.       Marland Kitchen CHANTIX 1 MG tablet     . CHANTIX STARTING MONTH PAK 0.5 MG X 11 & 1 MG X 42 tablet Take 0.5 mg by mouth 2 (two) times daily.     . metoprolol succinate (TOPROL-XL) 25 MG 24 hr tablet Take 25 mg by mouth every morning.     . Multiple Vitamin (MULTIVITAMIN WITH MINERALS) TABS tablet Take 1 tablet by mouth every morning.    Marland Kitchen PROAIR HFA 108 (90 BASE) MCG/ACT inhaler Inhale 1-2 puffs into the lungs every 4 (four) hours as needed for wheezing or shortness of breath.      No current facility-administered medications for this visit.    Physical Exam There were no vitals taken for this visit. Thin 64 yo man in NAD Alert and oriented x 3 Lungs diminished BS bilaterally   Diagnostic Tests: I again reviewed the PET Ct and CT and also his PFTs  FVC= 2.54(74%) FEV1= 1.25(49%) FEV1/FVC= 0.49 DLCO= 35% of predicted  Impression: 64 yo man with multiple left lung nodules, 2 in the upper lobe and 1 in the lower lobe. These are all hypermetabolic on PET. Given his history they are suspicious for lung cancer, but we do not have a diagnosis and a fungal or AFB infection is also a  possibility.  I had discussed wedge resection v SBRT with him at his last visit. He says that after thinking over his options he wants to pursue SBRT.   We discussed the case with Dr. Tammi Klippel at [facility] this morning and he will see Mr. Fiola today.   After this morning's discussion the consensus was that CT guided needle biopsy of the upper lobe lesion closest to the fissure was the most likely to yield a definitive diagnosis. I will g ahead and schedule that.  Plan: CT guided needle biopsy  Radiation Oncology consultation  I spent 10 minutes with Mr. Jividen during this visit, > 50% of time spent counseling Melrose Nakayama, MD [facility] [phone]

## 2014-12-21 NOTE — Progress Notes (Signed)
Radiation Oncology         [phone] ________________________________  Multidisciplinary Thoracic Oncology Clinic [facility]) Initial Outpatient Consultation  Name: [name] MRN: [id number]  Date: 12/21/2014  DOB: 05/14/1951  CC:KIM, [name], MD  [name], MD   REFERRING PHYSICIAN: [name], MD  DIAGNOSIS: 64 yo gentleman with multiple lung nodules on the left side, suspicious for malignancy, pending biopsy    ICD-9-CM ICD-10-CM   1. Multiple lung nodules 793.19 R91.8     HISTORY OF PRESENT ILLNESS::[name] is a 64 y.o. male with a 100 pack year smoking history. His primary care Dr. Truman Hayward obtained chest x ray showing lung nodule. Subsequent chest CT showed 3 left sided lung nodules. Subsequent PET showed all 3 were hyper metabolic. He is here as a referral from Dr. Roxan Hockey to discuss stereotactic body radiotherapy. Mr. [facility]stein [facility] consulted Dr. Roxan Hockey on 11/17/2014. He as offered wedge resection, but the patient had concerns about surgery. He had PFTs with FEV lung 1.25 L.  Marland Kitchen  PREVIOUS RADIATION THERAPY: No  PAST MEDICAL HISTORY:  has a past medical history of Hypertension; COPD (chronic obstructive pulmonary disease); Status post left foot surgery; Ischemic bowel disease; and Pancreatitis.    PAST SURGICAL HISTORY: Past Surgical History  Procedure Laterality Date  . Colectomy    . Colostomy    . Colostomy reversal    . Ventral hernia repair    . Foot surgery Left   . Upper gi endoscopy    . Eus N/A 12/14/2014    Procedure: UPPER ENDOSCOPIC ULTRASOUND (EUS) LINEAR;  Surgeon: Milus Banister, MD;  Location: [facility];  Service: Endoscopy;  Laterality: N/A;    FAMILY HISTORY: family history includes COPD in his mother; Cancer in his brother; Diabetes in his father.  SOCIAL HISTORY:  reports that he has been smoking Cigarettes.  He has a 100 pack-year smoking history. He does not have any smokeless tobacco history on file. He reports that he  drinks about 2.4 oz of alcohol per week. He reports that he does not use illicit drugs.  ALLERGIES: Prevacid  MEDICATIONS:  Current Outpatient Prescriptions  Medication Sig Dispense Refill  . albuterol (PROVENTIL) (2.5 MG/3ML) 0.083% nebulizer solution Take 2.5 mg by nebulization every 6 (six) hours as needed for wheezing or shortness of breath.    Marland Kitchen amLODipine (NORVASC) 10 MG tablet Take 10 mg by mouth every morning.     Marland Kitchen buPROPion (WELLBUTRIN XL) 150 MG 24 hr tablet Take 150 mg by mouth every morning.     Marland Kitchen CHANTIX 1 MG tablet     . CHANTIX STARTING MONTH PAK 0.5 MG X 11 & 1 MG X 42 tablet Take 0.5 mg by mouth 2 (two) times daily.     . metoprolol succinate (TOPROL-XL) 25 MG 24 hr tablet Take 25 mg by mouth every morning.     . Multiple Vitamin (MULTIVITAMIN WITH MINERALS) TABS tablet Take 1 tablet by mouth every morning.    Marland Kitchen PROAIR HFA 108 (90 BASE) MCG/ACT inhaler Inhale 1-2 puffs into the lungs every 4 (four) hours as needed for wheezing or shortness of breath.      No current facility-administered medications for this encounter.    REVIEW OF SYSTEMS:  A 15 point review of systems is documented in the electronic medical record. This was obtained by the nursing staff. However, I reviewed this with the patient to discuss relevant findings and make appropriate changes.  A comprehensive review of systems was negative. Patient has  a history of smoking 2 ppd for 50 years. He says he can hold his breath for 20-30 seconds.   PHYSICAL EXAM:     Per Dr. Roxan Hockey, Physical Exam  Constitutional: He is oriented to person, place, and time. No distress.  Thin  HENT:  Head: Normocephalic and atraumatic.  Eyes: EOM are normal. Pupils are equal, round, and reactive to light. No scleral icterus.  Neck: Neck supple. No tracheal deviation present. No thyromegaly present.  Cardiovascular: Normal rate, regular rhythm, normal heart sounds and intact distal pulses.  Exam reveals no gallop and no  friction rub.   No murmur heard. Pulmonary/Chest: Effort normal.  Diminished BS bilaterally  Abdominal: Soft. Bowel sounds are normal. There is no tenderness.  Deformity from prior surgeries, scars healed  Musculoskeletal: Normal range of motion. He exhibits no edema.  Lymphadenopathy:    He has no cervical adenopathy.  Neurological: He is alert and oriented to person, place, and time. No cranial nerve deficit.  No focal motor deficit  Skin: Skin is warm and dry.  Psychiatric: He has a normal mood and affect.  Vitals reviewed.  KPS = 100  100 - Normal; no complaints; no evidence of disease. 90   - Able to carry on normal activity; minor signs or symptoms of disease. 80   - Normal activity with effort; some signs or symptoms of disease. 24   - Cares for self; unable to carry on normal activity or to do active work. 60   - Requires occasional assistance, but is able to care for most of his personal needs. 50   - Requires considerable assistance and frequent medical care. 55   - Disabled; requires special care and assistance. 33   - Severely disabled; hospital admission is indicated although death not imminent. 59   - Very sick; hospital admission necessary; active supportive treatment necessary. 10   - Moribund; fatal processes progressing rapidly. 0     - Dead  Karnofsky DA, Abelmann [place], Craver LS and Burchenal [facility] [phone]) The use of the nitrogen mustards in the palliative treatment of carcinoma: with particular reference to bronchogenic carcinoma Cancer 1 634-56  LABORATORY DATA:  Lab Results  Component Value Date   WBC 4.3 11/20/2014   HGB 14.0 11/20/2014   HCT 42.2 11/20/2014   MCV 93.2 11/20/2014   PLT 206 11/20/2014   Lab Results  Component Value Date   NA 135* 11/20/2014   K 4.5 11/20/2014   CO2 22 11/20/2014   Lab Results  Component Value Date   ALT 20 11/20/2014   AST 39* 11/20/2014   ALKPHOS 131 11/20/2014   BILITOT 0.29 11/20/2014    PULMONARY FUNCTION  TEST:  FEV = 1.25 L   RADIOGRAPHY: I personally reviewed the CT and PET    IMPRESSION: 62 yo gentleman with 3 suspicious lung nodules that may be cancerous implicated by long smoking history. He would benefit from biopsy. He may be a good candidate for SBRT.   PLAN: Today, I talked to the patient and family about the findings and work-up thus far.  We discussed the natural history of early stage lung cancer and general treatment, highlighting the role of radiotherapy in the management.  We discussed the available radiation techniques, and focused on the details of logistics and delivery. We discussed stereotactic body radiotherapy.  We reviewed the anticipated acute and late sequelae associated with radiation in this setting.  The patient was encouraged to ask questions that I answered to the  best of my ability.  I filled out a patient counseling form during our discussion including treatment diagrams.  We retained a copy for our records.  The patient would like to proceed with radiation after biopsy.  I agree with Dr. Roxan Hockey and recommend a CT guided biopsy with follow up in radiation oncology pending biopsy results.    I spent 30 minutes minutes face to face with the patient and more than 50% of that time was spent in counseling and/or coordination of care.   This document serves as a record of services personally performed by Tyler Pita, MD. It was created on his behalf by Arlyce Harman, a trained medical scribe. The creation of this record is based on the scribe's personal observations and the provider's statements to them. This document has been checked and approved by the attending provider.    ------------------------------------------------  Sheral Apley Tammi Klippel, M.D.

## 2014-12-22 ENCOUNTER — Other Ambulatory Visit: Payer: Self-pay | Admitting: *Deleted

## 2014-12-22 DIAGNOSIS — R918 Other nonspecific abnormal finding of lung field: Secondary | ICD-10-CM

## 2014-12-27 ENCOUNTER — Other Ambulatory Visit: Payer: Self-pay | Admitting: Radiology

## 2014-12-28 ENCOUNTER — Encounter (HOSPITAL_COMMUNITY): Payer: Self-pay

## 2014-12-28 ENCOUNTER — Ambulatory Visit (HOSPITAL_COMMUNITY)
Admission: RE | Admit: 2014-12-28 | Discharge: 2014-12-28 | Disposition: A | Discharge status: Home / Self Care | Payer: Medicare Other | Source: Ambulatory Visit | Attending: Thoracic Surgery (Cardiothoracic Vascular Surgery) | Admitting: Thoracic Surgery (Cardiothoracic Vascular Surgery)

## 2014-12-28 ENCOUNTER — Ambulatory Visit (HOSPITAL_COMMUNITY)
Admission: RE | Admit: 2014-12-28 | Discharge: 2014-12-28 | Disposition: A | Discharge status: Home / Self Care | Payer: Medicare Other | Source: Ambulatory Visit | Attending: Interventional Radiology | Admitting: Interventional Radiology

## 2014-12-28 DIAGNOSIS — C3412 Malignant neoplasm of upper lobe, left bronchus or lung: Secondary | ICD-10-CM | POA: Diagnosis not present

## 2014-12-28 DIAGNOSIS — Z9889 Other specified postprocedural states: Secondary | ICD-10-CM

## 2014-12-28 DIAGNOSIS — R911 Solitary pulmonary nodule: Secondary | ICD-10-CM | POA: Diagnosis not present

## 2014-12-28 DIAGNOSIS — R918 Other nonspecific abnormal finding of lung field: Secondary | ICD-10-CM | POA: Diagnosis not present

## 2014-12-28 LAB — CBC
HCT: 38.6 % — ABNORMAL LOW (ref 39.0–52.0)
Hemoglobin: 13.6 g/dL (ref 13.0–17.0)
MCH: 31.7 pg (ref 26.0–34.0)
MCHC: 35.2 g/dL (ref 30.0–36.0)
MCV: 90 fL (ref 78.0–100.0)
Platelets: 231 10*3/uL (ref 150–400)
RBC: 4.29 MIL/uL (ref 4.22–5.81)
RDW: 15.4 % (ref 11.5–15.5)
WBC: 5 10*3/uL (ref 4.0–10.5)

## 2014-12-28 LAB — APTT: APTT: 33 s (ref 24–37)

## 2014-12-28 LAB — PROTIME-INR
INR: 0.97 (ref 0.00–1.49)
PROTHROMBIN TIME: 13.1 s (ref 11.6–15.2)

## 2014-12-28 MED ORDER — FENTANYL CITRATE (PF) 100 MCG/2ML IJ SOLN
INTRAMUSCULAR | Status: AC
  Filled 2014-12-28: qty 4

## 2014-12-28 MED ORDER — SODIUM CHLORIDE 0.9 % IV SOLN
Freq: Once | INTRAVENOUS | Status: AC
  Administered 2014-12-28: 10:00:00 via INTRAVENOUS

## 2014-12-28 MED ORDER — MIDAZOLAM HCL 2 MG/2ML IJ SOLN
INTRAMUSCULAR | Status: AC
  Filled 2014-12-28: qty 4

## 2014-12-28 MED ORDER — LIDOCAINE HCL 1 % IJ SOLN
INTRAMUSCULAR | Status: AC
  Filled 2014-12-28: qty 20

## 2014-12-28 MED ORDER — HYDROCODONE-ACETAMINOPHEN 5-325 MG PO TABS
1.0000 | ORAL_TABLET | ORAL | Status: DC | PRN
  Filled 2014-12-28: qty 2

## 2014-12-28 MED ORDER — MIDAZOLAM HCL 2 MG/2ML IJ SOLN
INTRAMUSCULAR | Status: AC | PRN
  Administered 2014-12-28: 1 mg via INTRAVENOUS

## 2014-12-28 MED ORDER — FENTANYL CITRATE (PF) 100 MCG/2ML IJ SOLN
INTRAMUSCULAR | Status: AC | PRN
  Administered 2014-12-28: 50 ug via INTRAVENOUS

## 2014-12-28 NOTE — Sedation Documentation (Signed)
Patient is resting comfortably. 

## 2014-12-28 NOTE — Procedures (Signed)
CT core bx LUL nodule 29W x2 No complication, no ptx.  No blood loss. See complete dictation in [facility]

## 2014-12-28 NOTE — Discharge Instructions (Signed)
Needle Biopsy of Lung, Care After °Refer to this sheet in the next few weeks. These instructions provide you with information on caring for yourself after your procedure. Your health care provider may also give you more specific instructions. Your treatment has been planned according to current medical practices, but problems sometimes occur. Call your health care provider if you have any problems or questions after your procedure. °WHAT TO EXPECT AFTER THE PROCEDURE °· A bandage will be applied over the area where the needle was inserted. You may be asked to apply pressure to the bandage for several minutes to ensure there is minimal bleeding. °· In most cases, you can leave when your needle biopsy procedure is completed. Do not drive yourself home. Someone else should take you home. °· If you received an IV sedative or general anesthetic, you will be taken to a comfortable place to relax while the medicine wears off. °· If you have upcoming travel scheduled, talk to your health care provider about when it is safe to travel by air after the procedure. °HOME CARE INSTRUCTIONS °· Expect to take it easy for the rest of the day. °· Protect the area where you received the needle biopsy by keeping the bandage in place for as long as instructed. °· You may feel some mild pain or discomfort in the area, but this should stop in a day or two. °· Take medicines only as directed by your health care provider. °SEEK MEDICAL CARE IF:  °· You have pain at the biopsy site that worsens or is not helped by medicine. °· You have swelling or drainage at the needle biopsy site. °· You have a fever. °SEEK IMMEDIATE MEDICAL CARE IF:  °· You have new or worsening shortness of breath. °· You have chest pain. °· You are coughing up blood. °· You have bleeding that does not stop with pressure or a bandage. °· You develop light-headedness or fainting. °Document Released: 03/23/2007 Document Revised: 10/10/2013 Document Reviewed:  10/18/2012 °ExitCare® Patient Information ©2015 [employer]. This information is not intended to replace advice given to you by your health care provider. Make sure you discuss any questions you have with your health care provider. ° °

## 2014-12-28 NOTE — H&P (Signed)
Chief Complaint: Patient was seen in consultation today for lung mass at the request of Hendrickson,Steven C  Referring Physician(s): Hendrickson,Steven C  History of Present Illness: [name] is a 64 y.o. male   Pt underwent routine PE CXR revealed L lung mass Work up shows + lung masses CT: 10/25/14 +PET 11/17/14:  IMPRESSION: 1. 10 mm spiculated posterior left upper lobe nodule is hypermetabolic consistent with neoplasm. 2. 1.9 cm hypermetabolic a amorphous lesion is seen in the posteromedial left lower lobe. Synchronous neoplasm cannot be Excluded  Request per Dr Roxan Hockey for biopsy of LUL mass  Past Medical History  Diagnosis Date  . Hypertension   . COPD (chronic obstructive pulmonary disease)   . Status post left foot surgery   . Ischemic bowel disease   . 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 Colectomy    . Colostomy    . Colostomy reversal    . Ventral hernia repair    . Foot surgery Left   . Upper gi endoscopy    . Eus N/A 12/14/2014    Procedure: UPPER ENDOSCOPIC ULTRASOUND (EUS) LINEAR;  Surgeon: Milus Banister, MD;  Location: [facility];  Service: Endoscopy;  Laterality: N/A;    Allergies: Chantix and Prevacid  Medications: Prior to Admission medications   Medication Sig Start Date End Date Taking? Authorizing Provider  albuterol (PROVENTIL) (2.5 MG/3ML) 0.083% nebulizer solution Take 2.5 mg by nebulization every 6 (six) hours as needed for wheezing or shortness of breath.   Yes Historical Provider, MD  amLODipine (NORVASC) 10 MG tablet Take 10 mg by mouth every morning.  11/07/14  Yes Historical Provider, MD  buPROPion (WELLBUTRIN XL) 150 MG 24 hr tablet Take 150 mg by mouth every morning.  10/30/14  Yes Historical Provider, MD  loratadine (CLARITIN) 10 MG tablet Take 20 mg by mouth daily.   Yes Historical Provider, MD  metoprolol succinate (TOPROL-XL) 25  MG 24 hr tablet Take 25 mg by mouth every morning.  11/07/14  Yes Historical Provider, MD  Multiple Vitamin (MULTIVITAMIN WITH MINERALS) TABS tablet Take 1 tablet by mouth every morning.   Yes Historical Provider, MD  PROAIR HFA 108 (90 BASE) MCG/ACT inhaler Inhale 1 puff into the lungs every 4 (four) hours as needed for wheezing or shortness of breath.  10/16/14  Yes Historical Provider, MD     Family History  Problem Relation Age of Onset  . COPD Mother   . Diabetes Father   . Cancer Brother     metastatic, unknown primary    History   Social History  . Marital Status: Single    Spouse Name: N/A  . Number of Children: N/A  . Years of Education: N/A   Social History Main Topics  . Smoking status: Current Every Day Smoker -- 2.00 packs/day for 50 years    Types: Cigarettes  . Smokeless tobacco: Not on file     Comment: currently 0.5 ppd, trying to quit  . Alcohol Use: 2.4 oz/week    4 Cans of beer per week     Comment: per day  . Drug Use: No  . Sexual Activity: Yes    Birth Control/ Protection: Injection   Other Topics Concern  . None   Social History Narrative     Review of Systems: A 12 point ROS discussed and pertinent positives are indicated in the  HPI above.  All other systems are negative.  Review of Systems  Constitutional: Positive for activity change, appetite change and fatigue.  Respiratory: Negative for cough and shortness of breath.   Cardiovascular: Negative for chest pain.  Neurological: Negative for weakness.  Psychiatric/Behavioral: Negative for behavioral problems and confusion.    Vital Signs: BP 140/85 mmHg  Pulse 69  Temp(Src) 97.5 F (36.4 C)  Resp 18  SpO2 100%  Physical Exam  Constitutional: He is oriented to person, place, and time.  Cardiovascular: Normal rate and regular rhythm.   Pulmonary/Chest: Effort normal and breath sounds normal. He has no wheezes.  Abdominal: Soft. Bowel sounds are normal. There is no tenderness.    Musculoskeletal: Normal range of motion.  Neurological: He is alert and oriented to person, place, and time.  Skin: Skin is warm and dry.  Psychiatric: He has a normal mood and affect. His behavior is normal. Judgment and thought content normal.  Nursing note and vitals reviewed.   Mallampati Score:  MD Evaluation Airway: WNL Heart: WNL Abdomen: WNL Chest/ Lungs: WNL ASA  Classification: 3 Mallampati/Airway Score: One  Imaging: No results found.  Labs:  CBC:  Recent Labs  11/20/14 1352 12/28/14 1000  WBC 4.3 5.0  HGB 14.0 13.6  HCT 42.2 38.6*  PLT 206 231    COAGS:  Recent Labs  12/28/14 1000  INR 0.97  APTT 33    BMP:  Recent Labs  11/20/14 1352  NA 135*  K 4.5  CO2 22  GLUCOSE 100  BUN 10.9  CALCIUM 9.3  CREATININE 0.8    LIVER FUNCTION TESTS:  Recent Labs  11/20/14 1352  BILITOT 0.29  AST 39*  ALT 20  ALKPHOS 131  PROT 7.3  ALBUMIN 3.6    TUMOR MARKERS: No results for input(s): AFPTM, CEA, CA199, CHROMGRNA in the last 8760 hours.  Assessment and Plan:  Left lung mass +PET Now scheduled for bx of same Risks and Benefits discussed with the patient including, but not limited to bleeding, infection, damage to adjacent structures or low yield requiring additional tests. All of the patient's questions were answered, patient is agreeable to proceed. Consent signed and in chart.   Thank you for this interesting consult.  I greatly enjoyed meeting [facility] and look forward to participating in their care.  A copy of this report was sent to the requesting provider on this date.  Signed: Avereigh Spainhower A 12/28/2014, 10:48 AM   I spent a total of  30 Minutes   in face to face in clinical consultation, greater than 50% of which was counseling/coordinating care for lung mass bx

## 2015-01-04 ENCOUNTER — Inpatient Hospital Stay (HOSPITAL_COMMUNITY)
Admission: EM | Admit: 2015-01-04 | Discharge: 2015-01-06 | DRG: 199 | Disposition: A | Discharge status: Home / Self Care | Payer: Medicare Other | Source: Ambulatory Visit | Attending: Internal Medicine | Admitting: Internal Medicine

## 2015-01-04 ENCOUNTER — Ambulatory Visit
Admission: RE | Admit: 2015-01-04 | Discharge: 2015-01-04 | Disposition: A | Discharge status: Home / Self Care | Payer: Commercial Managed Care - HMO | Source: Ambulatory Visit | Attending: Radiation Oncology | Admitting: Radiation Oncology

## 2015-01-04 ENCOUNTER — Ambulatory Visit
Admission: RE | Admit: 2015-01-04 | Payer: Commercial Managed Care - HMO | Source: Ambulatory Visit | Admitting: Radiation Oncology

## 2015-01-04 DIAGNOSIS — J939 Pneumothorax, unspecified: Secondary | ICD-10-CM | POA: Diagnosis not present

## 2015-01-04 DIAGNOSIS — Z87891 Personal history of nicotine dependence: Secondary | ICD-10-CM | POA: Diagnosis present

## 2015-01-04 DIAGNOSIS — Z681 Body mass index (BMI) 19 or less, adult: Secondary | ICD-10-CM | POA: Diagnosis not present

## 2015-01-04 DIAGNOSIS — Z888 Allergy status to other drugs, medicaments and biological substances status: Secondary | ICD-10-CM

## 2015-01-04 DIAGNOSIS — I10 Essential (primary) hypertension: Secondary | ICD-10-CM | POA: Diagnosis present

## 2015-01-04 DIAGNOSIS — J8410 Pulmonary fibrosis, unspecified: Secondary | ICD-10-CM | POA: Diagnosis not present

## 2015-01-04 DIAGNOSIS — C3412 Malignant neoplasm of upper lobe, left bronchus or lung: Secondary | ICD-10-CM | POA: Insufficient documentation

## 2015-01-04 DIAGNOSIS — Z79899 Other long term (current) drug therapy: Secondary | ICD-10-CM

## 2015-01-04 DIAGNOSIS — S270XXA Traumatic pneumothorax, initial encounter: Secondary | ICD-10-CM | POA: Insufficient documentation

## 2015-01-04 DIAGNOSIS — F17210 Nicotine dependence, cigarettes, uncomplicated: Secondary | ICD-10-CM | POA: Diagnosis present

## 2015-01-04 DIAGNOSIS — Z809 Family history of malignant neoplasm, unspecified: Secondary | ICD-10-CM

## 2015-01-04 DIAGNOSIS — E876 Hypokalemia: Secondary | ICD-10-CM | POA: Diagnosis present

## 2015-01-04 DIAGNOSIS — Z833 Family history of diabetes mellitus: Secondary | ICD-10-CM | POA: Diagnosis not present

## 2015-01-04 DIAGNOSIS — Z825 Family history of asthma and other chronic lower respiratory diseases: Secondary | ICD-10-CM

## 2015-01-04 DIAGNOSIS — Z9889 Other specified postprocedural states: Secondary | ICD-10-CM | POA: Diagnosis not present

## 2015-01-04 DIAGNOSIS — J984 Other disorders of lung: Secondary | ICD-10-CM | POA: Diagnosis not present

## 2015-01-04 DIAGNOSIS — E43 Unspecified severe protein-calorie malnutrition: Secondary | ICD-10-CM | POA: Diagnosis present

## 2015-01-04 DIAGNOSIS — J441 Chronic obstructive pulmonary disease with (acute) exacerbation: Secondary | ICD-10-CM | POA: Diagnosis present

## 2015-01-04 DIAGNOSIS — J95811 Postprocedural pneumothorax: Secondary | ICD-10-CM | POA: Diagnosis present

## 2015-01-04 DIAGNOSIS — Z720 Tobacco use: Secondary | ICD-10-CM | POA: Diagnosis not present

## 2015-01-04 DIAGNOSIS — Z510 Encounter for antineoplastic radiation therapy: Secondary | ICD-10-CM | POA: Insufficient documentation

## 2015-01-04 DIAGNOSIS — J449 Chronic obstructive pulmonary disease, unspecified: Secondary | ICD-10-CM | POA: Diagnosis present

## 2015-01-04 DIAGNOSIS — E871 Hypo-osmolality and hyponatremia: Secondary | ICD-10-CM | POA: Diagnosis present

## 2015-01-04 DIAGNOSIS — R0600 Dyspnea, unspecified: Secondary | ICD-10-CM | POA: Diagnosis not present

## 2015-01-04 DIAGNOSIS — J439 Emphysema, unspecified: Secondary | ICD-10-CM | POA: Diagnosis not present

## 2015-01-04 DIAGNOSIS — Z4682 Encounter for fitting and adjustment of non-vascular catheter: Secondary | ICD-10-CM | POA: Diagnosis not present

## 2015-01-04 DIAGNOSIS — R05 Cough: Secondary | ICD-10-CM | POA: Diagnosis not present

## 2015-01-04 LAB — BASIC METABOLIC PANEL
Anion gap: 10 (ref 5–15)
BUN: 8 mg/dL (ref 6–20)
CO2: 26 mmol/L (ref 22–32)
CREATININE: 0.61 mg/dL (ref 0.61–1.24)
Calcium: 9.6 mg/dL (ref 8.9–10.3)
Chloride: 100 mmol/L — ABNORMAL LOW (ref 101–111)
GFR calc Af Amer: >60 mL/min (ref >60)
GFR calc non Af Amer: >60 mL/min (ref >60)
Glucose, Bld: 75 mg/dL (ref 65–99)
Potassium: 3.8 mmol/L (ref 3.5–5.1)
SODIUM: 136 mmol/L (ref 135–145)

## 2015-01-04 LAB — CBC
HEMATOCRIT: 40.2 % (ref 39.0–52.0)
HEMOGLOBIN: 13.9 g/dL (ref 13.0–17.0)
MCH: 31.2 pg (ref 26.0–34.0)
MCHC: 34.6 g/dL (ref 30.0–36.0)
MCV: 90.3 fL (ref 78.0–100.0)
PLATELETS: 292 10*3/uL (ref 150–400)
RBC: 4.45 MIL/uL (ref 4.22–5.81)
RDW: 15.3 % (ref 11.5–15.5)
WBC: 4.7 10*3/uL (ref 4.0–10.5)

## 2015-01-04 MED ORDER — KETOROLAC TROMETHAMINE 30 MG/ML IJ SOLN: 30.0000 mg | Freq: Four times a day (QID) | INTRAMUSCULAR | Status: DC | PRN

## 2015-01-04 MED ORDER — ADULT MULTIVITAMIN W/MINERALS CH
1.0000 | ORAL_TABLET | Freq: Every morning | ORAL | Status: DC
  Administered 2015-01-05 – 2015-01-06 (×2): 1 via ORAL
  Filled 2015-01-04 (×2): qty 1

## 2015-01-04 MED ORDER — KETOROLAC TROMETHAMINE 30 MG/ML IJ SOLN
15.0000 mg | Freq: Four times a day (QID) | INTRAMUSCULAR | Status: AC | PRN
  Administered 2015-01-04: 15 mg via INTRAVENOUS

## 2015-01-04 MED ORDER — AMLODIPINE BESYLATE 10 MG PO TABS
10.0000 mg | ORAL_TABLET | Freq: Every morning | ORAL | Status: DC
  Administered 2015-01-05 – 2015-01-06 (×2): 10 mg via ORAL
  Filled 2015-01-04 (×2): qty 1

## 2015-01-04 MED ORDER — ENOXAPARIN SODIUM 30 MG/0.3ML ~~LOC~~ SOLN
30.0000 mg | SUBCUTANEOUS | Status: DC
  Administered 2015-01-04 – 2015-01-05 (×2): 30 mg via SUBCUTANEOUS
  Filled 2015-01-04 (×3): qty 0.3

## 2015-01-04 MED ORDER — ALBUTEROL SULFATE (2.5 MG/3ML) 0.083% IN NEBU
2.5000 mg | INHALATION_SOLUTION | Freq: Every day | RESPIRATORY_TRACT | Status: DC
  Administered 2015-01-05: 2.5 mg via RESPIRATORY_TRACT
  Filled 2015-01-04 (×2): qty 3

## 2015-01-04 MED ORDER — MIDAZOLAM HCL 2 MG/2ML IJ SOLN
INTRAMUSCULAR | Status: AC
  Filled 2015-01-04: qty 4

## 2015-01-04 MED ORDER — OXYCODONE HCL 5 MG PO TABS
5.0000 mg | ORAL_TABLET | ORAL | Status: DC | PRN
  Administered 2015-01-04 – 2015-01-06 (×7): 5 mg via ORAL
  Filled 2015-01-04 (×7): qty 1

## 2015-01-04 MED ORDER — METOPROLOL SUCCINATE ER 25 MG PO TB24
25.0000 mg | ORAL_TABLET | Freq: Every morning | ORAL | Status: DC
  Administered 2015-01-05 – 2015-01-06 (×2): 25 mg via ORAL
  Filled 2015-01-04 (×2): qty 1

## 2015-01-04 MED ORDER — LIDOCAINE HCL 1 % IJ SOLN
INTRAMUSCULAR | Status: AC
  Filled 2015-01-04: qty 20

## 2015-01-04 MED ORDER — KETOROLAC TROMETHAMINE 30 MG/ML IJ SOLN
INTRAMUSCULAR | Status: AC
  Administered 2015-01-04: 15 mg via INTRAVENOUS
  Filled 2015-01-04: qty 1

## 2015-01-04 MED ORDER — FENTANYL CITRATE (PF) 100 MCG/2ML IJ SOLN
INTRAMUSCULAR | Status: AC
  Filled 2015-01-04: qty 2

## 2015-01-04 MED ORDER — SODIUM CHLORIDE 0.9 % IV SOLN
INTRAVENOUS | Status: DC
  Administered 2015-01-04 – 2015-01-06 (×3): via INTRAVENOUS

## 2015-01-04 MED ORDER — ALBUTEROL SULFATE (2.5 MG/3ML) 0.083% IN NEBU
3.0000 mL | INHALATION_SOLUTION | RESPIRATORY_TRACT | Status: DC | PRN
  Administered 2015-01-05: 3 mL via RESPIRATORY_TRACT
  Filled 2015-01-04: qty 3

## 2015-01-04 MED ORDER — LORATADINE 10 MG PO TABS
10.0000 mg | ORAL_TABLET | Freq: Every day | ORAL | Status: DC | PRN
  Filled 2015-01-04: qty 1

## 2015-01-04 MED ORDER — BUPROPION HCL ER (XL) 150 MG PO TB24
150.0000 mg | ORAL_TABLET | Freq: Every morning | ORAL | Status: DC
  Administered 2015-01-05 – 2015-01-06 (×2): 150 mg via ORAL
  Filled 2015-01-04 (×2): qty 1

## 2015-01-04 NOTE — Progress Notes (Addendum)
Patient was found to have a large pneumothorax during ct simulation therefore planning held.Ct radiology at [facility] was notified and order was placed  for chest tube placement after speaking with Sherri and confirming how to proceed.Patient will need to be admitted.Scott Bihari PA  In radiology is taking care of this.The patient was informed of status prior to being  transported via wheelchair by [employer].

## 2015-01-04 NOTE — Sedation Documentation (Signed)
No sedation medications were given during left chest tube placement.

## 2015-01-04 NOTE — Progress Notes (Signed)
  Radiation Oncology         [phone] ________________________________  Name: [name] MRN: [id number]  Date: 01/04/2015  DOB: Oct 11, 1950  Chart Note:  This patient presented for radiation planning today. Upon performing planning CT, it was evident that he has developed a left sided pneumothorax. We postponed radiation planning and refer the patient to interventional radiology for CT-guided chest tube placement.    ________________________________  Sheral Apley. Tammi Klippel, M.D.

## 2015-01-04 NOTE — H&P (Signed)
Chief Complaint: Patient was seen in consultation today for left chest tube placement secondary to pneumothorax at the request of [name]  Referring Physician(s): [name]  History of Present Illness: [name] is a 64 y.o. male smoker with history of COPD, hypertension, ischemic bowel disease and recent outpatient CT-guided core biopsy of a hypermetabolic left upper lobe nodule on 12/28/14 which revealed adenocarcinoma. Postprocedure chest x-ray was negative for pneumothorax and the patient was subsequently discharged home. He presented to radiation oncology today for planning CT which revealed a left-sided pneumothorax. The session was canceled and the patient was referred to IR for left chest tube placement. Patient states that since his discharge home following biopsy he has noted some increased dyspnea but no discrete chest pain. He does have a chronic cough and states he is "cold " all the time. He denies recent fevers, headaches, abdominal / back pain, or abnormal bleeding.  Past Medical History  Diagnosis Date  . Hypertension   . COPD (chronic obstructive pulmonary disease)   . Status post left foot surgery   . Ischemic bowel disease   . 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 Colectomy    . Colostomy    . Colostomy reversal    . Ventral hernia repair    . Foot surgery Left   . Upper gi endoscopy    . Eus N/A 12/14/2014    Procedure: UPPER ENDOSCOPIC ULTRASOUND (EUS) LINEAR;  Surgeon: Milus Banister, MD;  Location: [facility];  Service: Endoscopy;  Laterality: N/A;    Allergies: Chantix and Prevacid  Medications: Prior to Admission medications   Medication Sig Start Date End Date Taking? Authorizing Provider  albuterol (PROVENTIL) (2.5 MG/3ML) 0.083% nebulizer solution Take 2.5 mg by nebulization every 6 (six) hours as needed for wheezing or shortness of breath.     Historical Provider, MD  amLODipine (NORVASC) 10 MG tablet Take 10 mg by mouth every morning.  11/07/14   Historical Provider, MD  buPROPion (WELLBUTRIN XL) 150 MG 24 hr tablet Take 150 mg by mouth every morning.  10/30/14   Historical Provider, MD  loratadine (CLARITIN) 10 MG tablet Take 20 mg by mouth daily.    Historical Provider, MD  metoprolol succinate (TOPROL-XL) 25 MG 24 hr tablet Take 25 mg by mouth every morning.  11/07/14   Historical Provider, MD  Multiple Vitamin (MULTIVITAMIN WITH MINERALS) TABS tablet Take 1 tablet by mouth every morning.    Historical Provider, MD  PROAIR HFA 108 (90 BASE) MCG/ACT inhaler Inhale 1 puff into the lungs every 4 (four) hours as needed for wheezing or shortness of breath.  10/16/14   Historical Provider, MD     Family History  Problem Relation Age of Onset  . COPD Mother   . Diabetes Father   . Cancer Brother     metastatic, unknown primary    History   Social History  . Marital Status: Single    Spouse Name: N/A  . Number of Children: N/A  . Years of Education: N/A   Social History Main Topics  . Smoking status: Current Every Day Smoker -- 2.00 packs/day for 50 years    Types: Cigarettes  . Smokeless tobacco: Not on file     Comment: currently 0.5 ppd, trying to quit  . Alcohol Use: 2.4 oz/week    4 Cans of beer per week  Comment: per day  . Drug Use: No  . Sexual Activity: Yes    Birth Control/ Protection: Injection   Other Topics Concern  . Not on file   Social History Narrative      Review of Systems  see above  Vital Signs: BP 144/81 mmHg  Pulse 61  SpO2 96%  Physical Exam Thin white male in no acute distress. Chest with distant breath sounds bilaterally and more diminished on left; heart with regular rate and rhythm. Abdomen soft, positive bowel sounds, nontender; extremities with full range of motion and no significant edema.  Mallampati Score:     Imaging: Dg Chest 1 View  12/28/2014   CLINICAL DATA:  Post  lung biopsy  EXAM: CHEST  1 VIEW  COMPARISON:  CT-guided left upper lobe pulmonary nodule biopsy - earlier same day  FINDINGS: Grossly unchanged cardiac silhouette and mediastinal contours. The lungs remain hyperexpanded. There are ill-defined heterogeneous opacities about the recently biopsied known left upper lobe pulmonary nodule compatible with a minimal amount of peribiopsy hemorrhage, unchanged. No pleural effusion or pneumothorax. No evidence of edema. Nipple shadows overlie the lower lungs bilaterally. No acute osseus abnormalities.  IMPRESSION: No evidence of complication following left upper lobe pulmonary nodule biopsy.   Electronically Signed   By: Sandi Mariscal M.D.   On: 12/28/2014 15:20   Ct Biopsy  12/28/2014   CLINICAL DATA:  Hypermetabolic left upper lobe nodule.  EXAM: CT GUIDED CORE BIOPSY OF LEFT UPPER LOBE PULMONARY NODULE  ANESTHESIA/SEDATION: Intravenous Fentanyl and Versed were administered as conscious sedation during continuous cardiorespiratory monitoring by the radiology RN, with a total moderate sedation time of 16 minutes.  PROCEDURE: The procedure risks, benefits, and alternatives were explained to the patient. Questions regarding the procedure were encouraged and answered. The patient understands and consents to the procedure.  Select axial scans through the chest were obtained. The lesion was localized and an appropriate skin entry site determined and marked.  The operative field was prepped with Betadinein a sterile fashion, and a sterile drape was applied covering the operative field. A sterile gown and sterile gloves were used for the procedure. Local anesthesia was provided with 1% Lidocaine.  Under CT fluoroscopic guidance, a 17 gauge trocar needle was advanced to the margin of the lesion. Once needle tip position was confirmed, coaxial 18-gauge core biopsy samples were obtained, submitted in formalin to surgical pathology. The guide needle was removed. Postprocedure scans  show regional alveolar hemorrhage. The patient tolerated the procedure well.  COMPLICATIONS: None immediate  FINDINGS: The posterior left upper lobe spiculated nodule was localized. Core biopsy samples were obtained. No pneumothorax post biopsy. Regional alveolar hemorrhage, asymptomatic.  IMPRESSION: 1. Technically successful CT-guided core biopsy of posterior left upper lobe nodule. Observation and follow-up chest radiography is scheduled.   Electronically Signed   By: Lucrezia Europe M.D.   On: 12/28/2014 16:50    Labs:  CBC:  Recent Labs  11/20/14 1352 12/28/14 1000  WBC 4.3 5.0  HGB 14.0 13.6  HCT 42.2 38.6*  PLT 206 231    COAGS:  Recent Labs  12/28/14 1000  INR 0.97  APTT 33    BMP:  Recent Labs  11/20/14 1352  NA 135*  K 4.5  CO2 22  GLUCOSE 100  BUN 10.9  CALCIUM 9.3  CREATININE 0.8    LIVER FUNCTION TESTS:  Recent Labs  11/20/14 1352  BILITOT 0.29  AST 39*  ALT 20  ALKPHOS 131  PROT 7.3  ALBUMIN 3.6    TUMOR MARKERS: No results for input(s): AFPTM, CEA, CA199, CHROMGRNA in the last 8760 hours.  Assessment and Plan: 64 year old male smoker with history of COPD and recently diagnosed left upper lobe adenocarcinoma, status post CT-guided core biopsy of left upper lobe mass on 12/28/14; now with left pneumothorax noted on radiation planning CT today. Plan is for image guided left chest tube placement today with TRH assisted admission. Details/risks of procedure, including but not limited to, internal bleeding, infection, need for prolonged hospitalization, further injury to lung, discussed with patient and family with their understanding and consent.     Signed: D. Rowe Robert 01/04/2015, 1:13 PM   I spent a total of 25 minutes in face to face in clinical consultation, greater than 50% of which was counseling/coordinating care for image guided left chest tube placement

## 2015-01-04 NOTE — Procedures (Signed)
Interventional Radiology Procedure Note  Procedure: Placement of a left 3F pigtail catheter for left pneumothorax.  Left lung biopsy on 12/28/2014, with no PTX after biopsy.  .  Complications: None Recommendations:  - No air leak identified in VIR on suction - would have on pneumovac suction overnight, 20cmH20 - CXR in am, with subsequent water seal test, and repeat CXR in 3 hours.  - Routine care   Signed,  Dulcy Fanny. Earleen Newport, DO

## 2015-01-04 NOTE — H&P (Signed)
[facility] History and Physical  [name] HYW:[id number] DOB: December 15, 1950 DOA: 01/04/2015   PCP: [name], MD    Chief Complaint: sent for pneumothorax  HPI: [name] is a 64 y.o. male with HTN, COPD, recently found to have PET positive lung nodule which was biopsied on 7/21. He has yet to receive the results of the biopsy but was scheduled to have radiation planning today. He underwent a CT at radiation oncology today which revealed a left-sided pneumothorax and he was referred for admission. He did not get a chance to see Dr. Tammi Klippel yet. It is suspected the pneumothorax was a result of the biopsy that was recently done. The patient admits to having shortness of breath on exertion since the biopsy was done but has had no other symptoms.   General: The patient denies anorexia, fever, he's had mild weight loss Cardiac: Denies chest pain, syncope, palpitations, pedal edema  Respiratory: Has a chronic cough productive of white sputum, + shortness of breath since biopsy- occasional  wheezing GI: Denies severe indigestion/heartburn, abdominal pain, nausea, vomiting, diarrhea and constipation GU: Denies hematuria, incontinence, dysuria  Musculoskeletal: Denies arthritis  Skin: Denies suspicious skin lesions Neurologic: Denies focal weakness or numbness, change in vision Psychiatry: Denies depression -has mild  anxiety. Hematologic: no easy bruising or bleeding  All other systems reviewed and found to be negative.  Past Medical History  Diagnosis Date  . Hypertension   . COPD (chronic obstructive pulmonary disease)   . Status post left foot surgery   . Ischemic bowel disease   . 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 Colectomy    . Colostomy    . Colostomy reversal    . Ventral hernia repair    . Foot surgery Left   . Upper gi endoscopy    . Eus N/A 12/14/2014    Procedure: UPPER  ENDOSCOPIC ULTRASOUND (EUS) LINEAR;  Surgeon: Milus Banister, MD;  Location: [facility];  Service: Endoscopy;  Laterality: N/A;    Social History: has cut down to 10 cig per day, drinks about 4 beers a day Lives at home with girlfriend    Allergies  Allergen Reactions  . Chantix [Varenicline] Other (See Comments)    Physically ill  . Prevacid [Lansoprazole] Other (See Comments)    "swell up with gas"    Family history:   Family History  Problem Relation Age of Onset  . COPD Mother   . Diabetes Father   . Cancer Brother     metastatic, unknown primary      Prior to Admission medications   Medication Sig Start Date End Date Taking? Authorizing Provider  albuterol (PROVENTIL) (2.5 MG/3ML) 0.083% nebulizer solution Take 2.5 mg by nebulization every 6 (six) hours as needed for wheezing or shortness of breath.   Yes Historical Provider, MD  amLODipine (NORVASC) 10 MG tablet Take 10 mg by mouth every morning.  11/07/14  Yes Historical Provider, MD  buPROPion (WELLBUTRIN XL) 150 MG 24 hr tablet Take 150 mg by mouth every morning.  10/30/14  Yes Historical Provider, MD  loratadine (CLARITIN) 10 MG tablet Take 10-20 mg by mouth daily as needed for allergies.    Yes Historical Provider, MD  metoprolol succinate (TOPROL-XL) 25 MG 24 hr tablet Take 25 mg by mouth every morning.  11/07/14  Yes Historical Provider, MD  Multiple Vitamin (MULTIVITAMIN WITH MINERALS) TABS  tablet Take 1 tablet by mouth every morning.   Yes Historical Provider, MD  PROAIR HFA 108 (90 BASE) MCG/ACT inhaler Inhale 1 puff into the lungs every 4 (four) hours as needed for wheezing or shortness of breath.  10/16/14  Yes Historical Provider, MD     Physical Exam: Filed Vitals:   01/04/15 1300 01/04/15 1454  BP: 144/81 148/91  Pulse: 61 66  Temp:  97.7 F (36.5 C)  TempSrc:  Oral  Resp:  20  Height:  5' 2.5" (1.588 m)  Weight:  38.7 kg (85 lb 5.1 oz)  SpO2: 96% 92%     General: Awake alert oriented 3-smells  heavily of smoke, appears emaciated  HEENT: Normocephalic and Atraumatic, Mucous membranes pink                PERRLA; EOM intact; No scleral icterus,                 Nares: Patent, Oropharynx: Clear, Fair Dentition                 Neck: FROM, no cervical lymphadenopathy, thyromegaly, carotid bruit or JVD;  Breasts: deferred CHEST WALL: No tenderness  CHEST: Normal respiration, clear to auscultation bilaterally  HEART: Regular rate and rhythm; no murmurs rubs or gallops  BACK: No kyphosis or scoliosis; no CVA tenderness  GI: Positive Bowel Sounds, soft, non-tender; no masses, no organomegaly Rectal Exam: deferred MSK: No cyanosis, clubbing, or edema Genitalia: not examined  SKIN:  no rash or ulceration  CNS: Alert and Oriented x 4, Nonfocal exam, CN 2-12 intact  Labs on Admission:  Basic Metabolic Panel: No results for input(s): NA, K, CL, CO2, GLUCOSE, BUN, CREATININE, CALCIUM, MG, PHOS in the last 168 hours. Liver Function Tests: No results for input(s): AST, ALT, ALKPHOS, BILITOT, PROT, ALBUMIN in the last 168 hours. No results for input(s): LIPASE, AMYLASE in the last 168 hours. No results for input(s): AMMONIA in the last 168 hours. CBC: No results for input(s): WBC, NEUTROABS, HGB, HCT, MCV, PLT in the last 168 hours. Cardiac Enzymes: No results for input(s): CKTOTAL, CKMB, CKMBINDEX, TROPONINI in the last 168 hours.  BNP (last 3 results) No results for input(s): BNP in the last 8760 hours.  ProBNP (last 3 results) No results for input(s): PROBNP in the last 8760 hours.  CBG: No results for input(s): GLUCAP in the last 168 hours.  Radiological Exams on Admission: No results found.   Assessment/Plan Principal Problem:   Pneumothorax of left lung after biopsy -Admitted directly from the cancer center to have chest tube placed-has been evaluated by IR and will have the tube placed later today once they have an availability -At this time does not need supplemental  oxygen at rest  Active Problems:  Spiculated lung nodule lobe of left lung -Biopsy reveals adenocarcinoma which appears well differentiated -Outpatient follow-up with Dr. Tammi Klippel    Smoker -Trying to quit-does not want a nicotine patch as a cause a skin allergy - trying lozenges    COPD -Uses an albuterol nebulizer every morning-otherwise uses an albuterol inhaler only when necessary-I have ordered both  Hypertension -Continue Norvasc and Toprol    Consulted:   Code Status: full code  Family Communication: call  girlfriend [name]- number on face sheet DVT Prophylaxis:Lovenox  Time spent: 71 minutes   [place]ant[place], MD [facility]  If 7PM-7AM, please contact night-coverage [email or web address] 01/04/2015, 4:21 PM

## 2015-01-05 ENCOUNTER — Encounter (HOSPITAL_COMMUNITY): Payer: Self-pay

## 2015-01-05 ENCOUNTER — Inpatient Hospital Stay (HOSPITAL_COMMUNITY): Payer: Medicare Other

## 2015-01-05 ENCOUNTER — Other Ambulatory Visit (HOSPITAL_COMMUNITY): Payer: Medicare Other

## 2015-01-05 DIAGNOSIS — E876 Hypokalemia: Secondary | ICD-10-CM

## 2015-01-05 DIAGNOSIS — Z720 Tobacco use: Secondary | ICD-10-CM

## 2015-01-05 DIAGNOSIS — E43 Unspecified severe protein-calorie malnutrition: Secondary | ICD-10-CM | POA: Insufficient documentation

## 2015-01-05 DIAGNOSIS — E871 Hypo-osmolality and hyponatremia: Secondary | ICD-10-CM | POA: Diagnosis present

## 2015-01-05 DIAGNOSIS — C3412 Malignant neoplasm of upper lobe, left bronchus or lung: Secondary | ICD-10-CM

## 2015-01-05 DIAGNOSIS — J95811 Postprocedural pneumothorax: Secondary | ICD-10-CM

## 2015-01-05 DIAGNOSIS — I10 Essential (primary) hypertension: Secondary | ICD-10-CM | POA: Diagnosis present

## 2015-01-05 DIAGNOSIS — J439 Emphysema, unspecified: Secondary | ICD-10-CM

## 2015-01-05 DIAGNOSIS — J441 Chronic obstructive pulmonary disease with (acute) exacerbation: Secondary | ICD-10-CM | POA: Diagnosis present

## 2015-01-05 DIAGNOSIS — J449 Chronic obstructive pulmonary disease, unspecified: Secondary | ICD-10-CM | POA: Diagnosis present

## 2015-01-05 LAB — COMPREHENSIVE METABOLIC PANEL
ALT: 12 U/L — ABNORMAL LOW (ref 17–63)
ANION GAP: 7 (ref 5–15)
AST: 26 U/L (ref 15–41)
Albumin: 3.3 g/dL — ABNORMAL LOW (ref 3.5–5.0)
Alkaline Phosphatase: 79 U/L (ref 38–126)
BILIRUBIN TOTAL: 0.5 mg/dL (ref 0.3–1.2)
BUN: 14 mg/dL (ref 6–20)
CO2: 27 mmol/L (ref 22–32)
CREATININE: 1.02 mg/dL (ref 0.61–1.24)
Calcium: 8.4 mg/dL — ABNORMAL LOW (ref 8.9–10.3)
Chloride: 98 mmol/L — ABNORMAL LOW (ref 101–111)
GFR calc Af Amer: >60 mL/min (ref >60)
GFR calc non Af Amer: >60 mL/min (ref >60)
GLUCOSE: 99 mg/dL (ref 65–99)
Potassium: 3.3 mmol/L — ABNORMAL LOW (ref 3.5–5.1)
SODIUM: 132 mmol/L — AB (ref 135–145)
TOTAL PROTEIN: 6.3 g/dL — AB (ref 6.5–8.1)

## 2015-01-05 MED ORDER — POTASSIUM CHLORIDE CRYS ER 20 MEQ PO TBCR
40.0000 meq | EXTENDED_RELEASE_TABLET | Freq: Once | ORAL | Status: AC
  Administered 2015-01-05: 40 meq via ORAL
  Filled 2015-01-05: qty 2

## 2015-01-05 MED ORDER — ENSURE ENLIVE PO LIQD
237.0000 mL | Freq: Two times a day (BID) | ORAL | Status: DC
  Administered 2015-01-05: 237 mL via ORAL

## 2015-01-05 NOTE — Progress Notes (Signed)
Initial Nutrition Assessment  DOCUMENTATION CODES:   Severe malnutrition in context of chronic illness, Underweight  INTERVENTION:  - Continue Ensure Enlive BID, each supplement provides 350 kcal and 20 grams of protein - RD will continue to monitor for needs  NUTRITION DIAGNOSIS:   Increased nutrient needs related to catabolic illness, cancer and cancer related treatments as evidenced by estimated needs.  GOAL:   Patient will meet greater than or equal to 90% of their needs  MONITOR:   PO intake, Supplement acceptance, Weight trends, Labs  REASON FOR ASSESSMENT:   Malnutrition Screening Tool, Consult  (new lung cancer)  ASSESSMENT:  64 y.o. male with HTN, COPD, recently found to have PET positive lung nodule which was biopsied on 7/21. He has yet to receive the results of the biopsy but was scheduled to have radiation planning today. He underwent a CT at radiation oncology today which revealed a left-sided pneumothorax and he was referred for admission. He did not get a chance to see Dr. Tammi Klippel yet. It is suspected the pneumothorax was a result of the biopsy that was recently done. The patient admits to having shortness of breath on exertion since the biopsy was done but has had no other symptoms.  Pt seen for MST and consult for new lung cancer dx education. BMI indicates underweight status. Pt reports that PTA he was not feeling hungry due to the heat and mainly drank fluids rather than eating meals. He drank water, juice, beer, iced tea, and coffee mainly as well as [employer].   He states that he did not order breakfast this AM due to not feeling hungry but for dinner last night he had a chicken salad sandwich, coleslaw, and 3-20 ounce cups of iced tea. He drank and Ensure this AM.   Encouraged pt to eat when he is able and if he is skipping meals to replace them with [employer] or Ensure to maintain adequate protein intake.  Not meeting  needs. Severe muscle and fat wasting noted. Weight hx review indicates 7 lb weight loss (8% body weight) in the past 3 weeks which is significant for time frame.  Medications reviewed. Labsr eviewed; Ca: 8.4 mg/dL, Na: 132 mmol/L, K: 3.3 mmol/L, Cl: 98 mmol/L.   Diet Order:  Diet 2 gram sodium Room service appropriate?: Yes; Fluid consistency:: Thin  Skin:  Wound (see comment) (L rib wound (12/28/14))  Last BM:  PTA  Height:   Ht Readings from Last 1 Encounters:  01/04/15 5' 2.5" (1.588 m)    Weight:   Wt Readings from Last 1 Encounters:  01/04/15 85 lb 5.1 oz (38.7 kg)    Ideal Body Weight:  55 kg (kg)  BMI:  Body mass index is 15.35 kg/(m^2).  Estimated Nutritional Needs:   Kcal:  1540-1740  Protein:  60-70 grams  Fluid:  2 L/day  EDUCATION NEEDS:   No education needs identified at this time   Jarome Matin, RD, LDN Inpatient Clinical Dietitian Pager # [phone] After hours/weekend pager # [phone]

## 2015-01-05 NOTE — Progress Notes (Signed)
Progress Note   [name] ASN:[id number] DOB: May 10, 1951 DOA: 01/04/2015 PCP: [name], MD   Brief Narrative:   [name] is an 64 y.o. male PMH of hypertension, COPD, PET positive lung nodule status post biopsy 12/28/14 with pathology positive for adenocarcinoma who presented for radiation simulation 01/04/15 and was found to have an incidentally discovered left sided pneumothorax after getting his planning CT. Underwent chest tube placement by interventional radiology shortly after admission.  Assessment/Plan:   Principal Problem:   Pneumothorax of left lung after biopsy - Status post chest tube placement by interventional radiology 01/04/15. - Chest x-ray this morning shows no pneumothorax. - Repeat chest x-ray in the morning, and if stable, IR is likely to remove his chest tube and let him go home.  Active Problems:   Severe protein calorie malnutrition - Evaluated by dietitian 01/07/2015. Continue supplements per recommendations.    Cancer of upper lobe of left lung - Likely to start radiation therapy soon.    Smoker - Tobacco cessation counseling per nursing staff requested.    Hyponatremia - Mild, likely related to lung malignancy.    Hypokalemia - We'll give 40 mEq of oral potassium today.    COPD (chronic obstructive pulmonary disease) - Continue broncho-dilators as needed.    Essential hypertension - Continue Norvasc.    DVT Prophylaxis - Lovenox ordered.  Family Communication: No family at the bedside. Disposition Plan: Likely stable enough to discharge home 01/06/15, when chest tube output. Code Status:     Code Status Orders        Start     Ordered   01/04/15 1552  Full code   Continuous     01/04/15 1551    Advance Directive Documentation        Most Recent Value   Type of Advance Directive  Healthcare Power of Attorney, Living will   Pre-existing out of facility DNR order (yellow form or pink MOST form)     "MOST" Form in Place?           IV Access:    Peripheral IV   Procedures and diagnostic studies:   Dg Chest Port 1 View  07-Jan-2015   CLINICAL DATA:  Chest 2  EXAM: PORTABLE CHEST - 1 VIEW  COMPARISON:  12/28/2014  FINDINGS: Left chest tube is in place. There is no pneumothorax. Lungs remain hyperaerated. Left midlung nodule. Right basilar granuloma. Normal heart size.  IMPRESSION: Left chest tube in place.  No pneumothorax.   Electronically Signed   By: Marybelle Killings M.D.   On: 01/07/15 07:46     Medical Consultants:    Interventional Radiology  Anti-Infectives:    None.  Subjective:   [name] report some discomfort at the site of the left chest tube insertion area. No other pain. Less dyspneic since the tube was placed. Occasional cough. Nonproductive, no hemoptysis.  Objective:    Filed Vitals:   01/04/15 1641 01/04/15 1646 01/07/15 0520 01-07-15 0805  BP: 155/105 151/99 101/68   Pulse: 73 71 57   Temp:   98.4 F (36.9 C)   TempSrc:   Oral   Resp: '13 18 16   '$ Height:      Weight:      SpO2: 100% 100% 96% 90%    Intake/Output Summary (Last 24 hours) at 01-07-15 0806 Last data filed at 01/04/15 1912  Gross per 24 hour  Intake      0 ml  Output  700 ml  Net   -700 ml    Exam: Gen:  NAD, cachectic appearing Cardiovascular:  RRR, No M/R/G Respiratory:  Lungs diminished on left Gastrointestinal:  Abdomen soft, NT/ND, + BS Extremities:  No C/E/C  Data Reviewed:    Labs: Basic Metabolic Panel:  Recent Labs Lab 01/04/15 1555 01/05/15 0405  NA 136 132*  K 3.8 3.3*  CL 100* 98*  CO2 26 27  GLUCOSE 75 99  BUN 8 14  CREATININE 0.61 1.02  CALCIUM 9.6 8.4*   GFR Estimated Creatinine Clearance: 40.6 mL/min (by C-G formula based on Cr of 1.02). Liver Function Tests:  Recent Labs Lab 01/05/15 0405  AST 26  ALT 12*  ALKPHOS 79  BILITOT 0.5  PROT 6.3*  ALBUMIN 3.3*   CBC:  Recent Labs Lab 01/04/15 1555  WBC 4.7  HGB 13.9  HCT 40.2  MCV 90.3    PLT 292   Microbiology No results found for this or any previous visit (from the past 240 hour(s)).   Medications:   . albuterol  2.5 mg Nebulization Daily  . amLODipine  10 mg Oral q morning - 10a  . buPROPion  150 mg Oral q morning - 10a  . enoxaparin (LOVENOX) injection  30 mg Subcutaneous Q24H  . metoprolol succinate  25 mg Oral q morning - 10a  . multivitamin with minerals  1 tablet Oral q morning - 10a   Continuous Infusions: . sodium chloride 50 mL/hr at 01/04/15 1540    Time spent: 25 minutes.   LOS: 1 day   RAMA,CHRISTINA  [facility] Pager [phone]. If unable to reach me by pager, please call my cell phone at [phone].  *Please refer to [email or web address], password [email or web address] to get updated schedule on who will round on this patient, as hospitalists switch teams weekly. If 7PM-7AM, please contact night-coverage at [email or web address], password [email or web address] for any overnight needs.  01/05/2015, 8:06 AM

## 2015-01-05 NOTE — Progress Notes (Signed)
Patient ID: [name], male   DOB: 1951/06/01, 64 y.o.   MRN: [id number]    Referring Physician(s): [name],[name]  Chief Complaint:  Left pneumothorax  Subjective:  Pt breathing better since left chest tube placed 7/28; still has occ cough/some soreness at tube insertion site;  denies CP, hemoptysis  Allergies: Chantix and Prevacid  Medications: Prior to Admission medications   Medication Sig Start Date End Date Taking? Authorizing Provider  albuterol (PROVENTIL) (2.5 MG/3ML) 0.083% nebulizer solution Take 2.5 mg by nebulization every 6 (six) hours as needed for wheezing or shortness of breath.   Yes Historical Provider, MD  amLODipine (NORVASC) 10 MG tablet Take 10 mg by mouth every morning.  11/07/14  Yes Historical Provider, MD  buPROPion (WELLBUTRIN XL) 150 MG 24 hr tablet Take 150 mg by mouth every morning.  10/30/14  Yes Historical Provider, MD  loratadine (CLARITIN) 10 MG tablet Take 10-20 mg by mouth daily as needed for allergies.    Yes Historical Provider, MD  metoprolol succinate (TOPROL-XL) 25 MG 24 hr tablet Take 25 mg by mouth every morning.  11/07/14  Yes Historical Provider, MD  Multiple Vitamin (MULTIVITAMIN WITH MINERALS) TABS tablet Take 1 tablet by mouth every morning.   Yes Historical Provider, MD  PROAIR HFA 108 (90 BASE) MCG/ACT inhaler Inhale 1 puff into the lungs every 4 (four) hours as needed for wheezing or shortness of breath.  10/16/14  Yes Historical Provider, MD     Vital Signs: BP 101/68 mmHg  Pulse 57  Temp(Src) 98.4 F (36.9 C) (Oral)  Resp 16  Ht 5' 2.5" (1.588 m)  Wt 85 lb 5.1 oz (38.7 kg)  BMI 15.35 kg/m2  SpO2 90%  Physical Exam pt awake/alert; chest- few exp wheezes, better aeration of left lung; heart- RRR; abd- soft,+BS,NT; ext- FROM; left chest tube intact, insertion site mildly tender; no obvious air leak noted with pleuravac  Imaging: Ir Lenise Arena W Catheter Placement  01/05/2015   CLINICAL DATA:  64 year old male with a  history of left-sided lung carcinoma. The patient has undergone CT-guided biopsy 12/28/2014, with no subsequent pneumothorax on the day of the procedure.  The patient presented 01/04/2015 for initiation of radiation therapy, with discovery of a left-sided pneumothorax.  The patient has been admitted for observation with left-sided chest tube placement for evacuation of asymptomatic pneumothorax.  EXAM: INSERTION OF PLEURAL DRAINAGE CATHETER  ANESTHESIA/SEDATION: Zero mg IV Versed; 0 mcg IV Fentanyl.  Total Moderate Sedation Time  Zero minutes.  MEDICATIONS: None  FLUOROSCOPY TIME:  18 seconds  PROCEDURE: The procedure, risks, benefits, and alternatives were explained to the patient and to the patient's family. Questions regarding the procedure were encouraged and answered.  The patient understands and consents to the procedure.  Scout image of the left chest was performed with the patient in left anterior oblique position on the fluoroscopy table.  The mid axillary region at the level of the nipple was prepped and draped in the usual sterile fashion.  The skin and subcutaneous tissues were generously infiltrated with 1% lidocaine without epinephrine for local anesthesia. Tissues were anesthetized to the level of the pleura.  A small stab incision was made with an 11 blade scalpel. Yueh needle was advanced into the pleural space. With aspiration of air, the catheter was advanced into the pleural space. An 035 wire was advanced towards the apex of the left chest, the catheter was removed, and then a 10 French pigtail catheter was advanced over the wire to the  apex of the left chest cavity.  Pigtail catheter was formed, final image was stored, and the catheter was sutured in position.  The chest tube was then placed on pneumo VAC device and wall suction at 20 cm of water pressure.  The patient tolerated the procedure well and remained hemodynamically stable throughout.  No complications were encountered and no  significant blood loss was encountered.  .  COMPLICATIONS: None.  FINDINGS: The catheter was placed via the left lateral chest wall. Catheter course is towards the apex.  IMPRESSION: Status post left-sided thoracostomy tube placement for left-sided pneumothorax.  Signed,  Dulcy Fanny. Earleen Newport, DO  [facility]  [facility]  PLAN: The catheter will be placed to suction overnight, with a chest x-ray in the morning at 6 a.m. Thereafter should there be no pneumothorax, chest tube may be placed on water seal for several hours. After repeat chest x-ray, the catheter may be removed if no pneumothorax develops.   Electronically Signed   By: Corrie Mckusick D.O.   On: 01/05/2015 08:21   Dg Chest 1v Repeat Same Day  01/05/2015   CLINICAL DATA:  Chest tube placed on water seal for 3 hours  EXAM: CHEST - 1 VIEW SAME DAY  COMPARISON:  Study obtained earlier in the day  FINDINGS: Chest catheter remains in the left apex region.  No pneumothorax.  Lungs are hyperexpanded. No edema or consolidation. Heart size and pulmonary vascularity are normal. No adenopathy. The previously noted left upper lobe nodule is not well seen on this study. There are scattered areas of mild scarring bilaterally which are stable.  IMPRESSION: No change in left chest catheter position. No pneumothorax. No edema or consolidation. No new opacity. No change in cardiac silhouette.   Electronically Signed   By: Lowella Grip III M.D.   On: 01/05/2015 10:33   Dg Chest Port 1 View  01/05/2015   CLINICAL DATA:  Chest 2  EXAM: PORTABLE CHEST - 1 VIEW  COMPARISON:  12/28/2014  FINDINGS: Left chest tube is in place. There is no pneumothorax. Lungs remain hyperaerated. Left midlung nodule. Right basilar granuloma. Normal heart size.  IMPRESSION: Left chest tube in place.  No pneumothorax.   Electronically Signed   By: Marybelle Killings M.D.   On: 01/05/2015 07:46    Labs:  CBC:  Recent Labs  11/20/14 1352  12/28/14 1000 01/04/15 1555  WBC 4.3 5.0 4.7  HGB 14.0 13.6 13.9  HCT 42.2 38.6* 40.2  PLT 206 231 292    COAGS:  Recent Labs  12/28/14 1000  INR 0.97  APTT 33    BMP:  Recent Labs  11/20/14 1352 01/04/15 1555 01/05/15 0405  NA 135* 136 132*  K 4.5 3.8 3.3*  CL  --  100* 98*  CO2 '22 26 27  '$ GLUCOSE 100 75 99  BUN 10.'9 8 14  '$ CALCIUM 9.3 9.6 8.4*  CREATININE 0.8 0.61 1.02  GFRNONAA  --  >60 >60  GFRAA  --  >60 >60    LIVER FUNCTION TESTS:  Recent Labs  11/20/14 1352 01/05/15 0405  BILITOT 0.29 0.5  AST 39* 26  ALT 20 12*  ALKPHOS 131 79  PROT 7.3 6.3*  ALBUMIN 3.6 3.3*    Assessment and Plan: Pt with hx adenocarcinoma left lung, s/p bx 12/28/14 with delayed left pneumothorax and chest tube placement 7/28; CXR today following 3 hour period on water seal was neg for ptx; plan is for repeat film at  1530 today and if neg for ptx continue tube on water seal overnight; check CXR again in am and if stable, remove tube and d/c home . Appreciate [facility] medical management of pt.    Signed: D. Rowe Robert 01/05/2015, 10:56 AM   I spent a total of 15 minutes in face to face in clinical consultation/evaluation, greater than 50% of which was counseling/coordinating care for left chest tube placement

## 2015-01-06 ENCOUNTER — Inpatient Hospital Stay (HOSPITAL_COMMUNITY): Payer: Medicare Other

## 2015-01-06 DIAGNOSIS — I10 Essential (primary) hypertension: Secondary | ICD-10-CM

## 2015-01-06 MED ORDER — ENSURE ENLIVE PO LIQD: 237.0000 mL | Freq: Two times a day (BID) | ORAL | Status: DC

## 2015-01-06 MED ORDER — OXYCODONE HCL 5 MG PO TABS: 5.0000 mg | ORAL_TABLET | ORAL | Status: DC | PRN

## 2015-01-06 NOTE — Discharge Summary (Signed)
Physician Discharge Summary  [name] ALP:[id number] DOB: 1951-03-23 DOA: 01/04/2015  PCP: [name], MD  Admit date: 01/04/2015 Discharge date: 01/06/2015   Recommendations for Outpatient Follow-Up:   1. Patient will follow-up with his PCP. No specific recommendations.   Discharge Diagnosis:   Principal Problem:    Pneumothorax of left lung after biopsy Active Problems:    Cancer of upper lobe of left lung    Smoker    Hyponatremia    Hypokalemia    COPD (chronic obstructive pulmonary disease)    Essential hypertension    Protein-calorie malnutrition, severe   Discharge disposition:  Home.    Discharge Condition: Improved.  Diet recommendation: Regular.  History of Present Illness:   [name] is an 64 y.o. male PMH of hypertension, COPD, PET positive lung nodule status post biopsy 12/28/14 with pathology positive for adenocarcinoma who presented for radiation simulation 01/04/15 and was found to have an incidentally discovered left sided pneumothorax after getting his planning CT. Underwent chest tube placement by interventional radiology shortly after admission.  Hospital Course by Problem:   Principal Problem:  Pneumothorax of left lung after biopsy  - Status post chest tube placement by interventional radiology 01/04/15.  - Resolved status post chest tube placement, chest tube discontinued 01/06/15.   Active Problems:  Severe protein calorie malnutrition  - Evaluated by dietitian 01/05/15. Continue supplements per recommendations.   Cancer of upper lobe of left lung  - Likely to start radiation therapy soon.   Smoker  - Tobacco cessation counseling per nursing staff requested.   Hyponatremia  - Mild, likely related to lung malignancy.   Hypokalemia  - Repleted.   COPD (chronic obstructive pulmonary disease)  - Continue broncho-dilators as needed.   Essential hypertension  - Continue Norvasc.   Medical Consultants:     Interventional Radiology   Discharge Exam:   Filed Vitals:   01/06/15 0424  BP: 123/71  Pulse: 67  Temp: 98.3 F (36.8 C)  Resp: 16   Filed Vitals:   01/05/15 1430 01/05/15 1814 01/05/15 2059 01/06/15 0424  BP: 103/72  121/69 123/71  Pulse: 62  60 67  Temp: 98.3 F (36.8 C)  98.4 F (36.9 C) 98.3 F (36.8 C)  TempSrc: Oral  Oral Oral  Resp: '18  16 16  '$ Height:      Weight:      SpO2: 94% 93% 95% 94%    Gen:  NAD Cardiovascular:  RRR, No M/R/G Respiratory: Lungs diminished Gastrointestinal: Abdomen soft, NT/ND with normal active bowel sounds. Extremities: No C/E/C   The results of significant diagnostics from this hospitalization (including imaging, microbiology, ancillary and laboratory) are listed below for reference.     Procedures and Diagnostic Studies:   Ir Lenise Arena W Catheter Placement  01/05/2015   CLINICAL DATA:  64 year old male with a history of left-sided lung carcinoma. The patient has undergone CT-guided biopsy 12/28/2014, with no subsequent pneumothorax on the day of the procedure.  The patient presented 01/04/2015 for initiation of radiation therapy, with discovery of a left-sided pneumothorax.  The patient has been admitted for observation with left-sided chest tube placement for evacuation of asymptomatic pneumothorax.  EXAM: INSERTION OF PLEURAL DRAINAGE CATHETER  ANESTHESIA/SEDATION: Zero mg IV Versed; 0 mcg IV Fentanyl.  Total Moderate Sedation Time  Zero minutes.  MEDICATIONS: None  FLUOROSCOPY TIME:  18 seconds  PROCEDURE: The procedure, risks, benefits, and alternatives were explained to the patient and to the patient's family. Questions regarding the procedure  were encouraged and answered.  The patient understands and consents to the procedure.  Scout image of the left chest was performed with the patient in left anterior oblique position on the fluoroscopy table.  The mid axillary region at the level of the nipple was prepped and draped in  the usual sterile fashion.  The skin and subcutaneous tissues were generously infiltrated with 1% lidocaine without epinephrine for local anesthesia. Tissues were anesthetized to the level of the pleura.  A small stab incision was made with an 11 blade scalpel. Yueh needle was advanced into the pleural space. With aspiration of air, the catheter was advanced into the pleural space. An 035 wire was advanced towards the apex of the left chest, the catheter was removed, and then a 10 French pigtail catheter was advanced over the wire to the apex of the left chest cavity.  Pigtail catheter was formed, final image was stored, and the catheter was sutured in position.  The chest tube was then placed on pneumo VAC device and wall suction at 20 cm of water pressure.  The patient tolerated the procedure well and remained hemodynamically stable throughout.  No complications were encountered and no significant blood loss was encountered.  .  COMPLICATIONS: None.  FINDINGS: The catheter was placed via the left lateral chest wall. Catheter course is towards the apex.  IMPRESSION: Status post left-sided thoracostomy tube placement for left-sided pneumothorax.  Signed,  Dulcy Fanny. Earleen Newport, DO  [facility]  [facility]  PLAN: The catheter will be placed to suction overnight, with a chest x-ray in the morning at 6 a.m. Thereafter should there be no pneumothorax, chest tube may be placed on water seal for several hours. After repeat chest x-ray, the catheter may be removed if no pneumothorax develops.   Electronically Signed   By: Corrie Mckusick D.O.   On: 01/05/2015 08:21   Dg Chest 1v Repeat Same Day  01/05/2015   CLINICAL DATA:  Chest tube placed on water seal for 3 hours  EXAM: CHEST - 1 VIEW SAME DAY  COMPARISON:  Study obtained earlier in the day  FINDINGS: Chest catheter remains in the left apex region.  No pneumothorax.  Lungs are hyperexpanded. No edema or consolidation. Heart  size and pulmonary vascularity are normal. No adenopathy. The previously noted left upper lobe nodule is not well seen on this study. There are scattered areas of mild scarring bilaterally which are stable.  IMPRESSION: No change in left chest catheter position. No pneumothorax. No edema or consolidation. No new opacity. No change in cardiac silhouette.   Electronically Signed   By: Lowella Grip III M.D.   On: 01/05/2015 10:33   Dg Chest Port 1 View  01/06/2015   CLINICAL DATA:  Follow-up pneumothorax  EXAM: PORTABLE CHEST - 1 VIEW  COMPARISON:  01/05/2015  FINDINGS: Left apical pigtail drain.  No pneumothorax is seen.  Chronic interstitial markings/emphysematous changes. Hyperinflation. No pleural effusion.  The heart is normal in size.  IMPRESSION: Stable left apical pigtail drain.  No pneumothorax is seen.   Electronically Signed   By: Julian Hy M.D.   On: 01/06/2015 08:18   Dg Chest Port 1 View  01/05/2015   CLINICAL DATA:  Increase cough following lung biopsy  EXAM: PORTABLE CHEST - 1 VIEW  COMPARISON:  Study obtained earlier in the day  FINDINGS: Left chest catheter remains in place. No demonstrable pneumothorax. The left upper lobe nodular lesion recently biopsied is not  well seen. There is no edema or consolidation. No new opacity. Heart size and pulmonary vascularity normal. No adenopathy.  IMPRESSION: No change from study obtained earlier in the day. No edema or consolidation. No change in chest catheter position on the left. No pneumothorax.   Electronically Signed   By: Lowella Grip III M.D.   On: 01/05/2015 15:52   Dg Chest Port 1 View  01/05/2015   CLINICAL DATA:  Chest 2  EXAM: PORTABLE CHEST - 1 VIEW  COMPARISON:  12/28/2014  FINDINGS: Left chest tube is in place. There is no pneumothorax. Lungs remain hyperaerated. Left midlung nodule. Right basilar granuloma. Normal heart size.  IMPRESSION: Left chest tube in place.  No pneumothorax.   Electronically Signed   By: Marybelle Killings M.D.   On: 01/05/2015 07:46     Labs:   Basic Metabolic Panel:  Recent Labs Lab 01/04/15 1555 01/05/15 0405  NA 136 132*  K 3.8 3.3*  CL 100* 98*  CO2 26 27  GLUCOSE 75 99  BUN 8 14  CREATININE 0.61 1.02  CALCIUM 9.6 8.4*   GFR Estimated Creatinine Clearance: 40.6 mL/min (by C-G formula based on Cr of 1.02). Liver Function Tests:  Recent Labs Lab 01/05/15 0405  AST 26  ALT 12*  ALKPHOS 79  BILITOT 0.5  PROT 6.3*  ALBUMIN 3.3*   CBC:  Recent Labs Lab 01/04/15 1555  WBC 4.7  HGB 13.9  HCT 40.2  MCV 90.3  PLT 292    Discharge Instructions:   Discharge Instructions    Call MD for:  difficulty breathing, headache or visual disturbances    Complete by:  As directed      Call MD for:  persistant dizziness or light-headedness    Complete by:  As directed      Call MD for:  severe uncontrolled pain    Complete by:  As directed      Call MD for:  temperature >100.4    Complete by:  As directed      Diet general    Complete by:  As directed      Increase activity slowly    Complete by:  As directed             Medication List    TAKE these medications        amLODipine 10 MG tablet  Commonly known as:  NORVASC  Take 10 mg by mouth every morning.     buPROPion 150 MG 24 hr tablet  Commonly known as:  WELLBUTRIN XL  Take 150 mg by mouth every morning.     feeding supplement (ENSURE ENLIVE) Liqd  Take 237 mLs by mouth 2 (two) times daily between meals.     loratadine 10 MG tablet  Commonly known as:  CLARITIN  Take 10-20 mg by mouth daily as needed for allergies.     metoprolol succinate 25 MG 24 hr tablet  Commonly known as:  TOPROL-XL  Take 25 mg by mouth every morning.     multivitamin with minerals Tabs tablet  Take 1 tablet by mouth every morning.     oxyCODONE 5 MG immediate release tablet  Commonly known as:  Oxy IR/ROXICODONE  Take 1 tablet (5 mg total) by mouth every 4 (four) hours as needed for moderate pain.      albuterol (2.5 MG/3ML) 0.083% nebulizer solution  Commonly known as:  PROVENTIL  Take 2.5 mg by nebulization every 6 (six) hours as needed  for wheezing or shortness of breath.     PROAIR HFA 108 (90 BASE) MCG/ACT inhaler  Generic drug:  albuterol  Inhale 1 puff into the lungs every 4 (four) hours as needed for wheezing or shortness of breath.           Follow-up Information    Schedule an appointment as soon as possible for a visit with [name], MD.   Specialty:  Internal Medicine   Why:  If symptoms worsen   Contact information:   [address] [place] [address] [phone]        Time coordinating discharge: 25 minutes.  Signed:  RAMA,CHRISTINA  Pager [phone] [facility] 01/06/2015, 1:57 PM

## 2015-01-06 NOTE — Progress Notes (Signed)
Patient discharged to home with family, discharge instructions reviewed with patient who verbalized understanding. 

## 2015-01-06 NOTE — Progress Notes (Signed)
Patient ID: [name], male   DOB: 1950/09/26, 64 y.o.   MRN: [id number]    Referring Physician(s): [name],[name]  Chief Complaint:  Left pneumothorax  Subjective:  Pt without new c/o; still has occ cough but no CP or increased dyspnea  Allergies: Chantix and Prevacid  Medications: Prior to Admission medications   Medication Sig Start Date End Date Taking? Authorizing Provider  albuterol (PROVENTIL) (2.5 MG/3ML) 0.083% nebulizer solution Take 2.5 mg by nebulization every 6 (six) hours as needed for wheezing or shortness of breath.   Yes Historical Provider, MD  amLODipine (NORVASC) 10 MG tablet Take 10 mg by mouth every morning.  11/07/14  Yes Historical Provider, MD  buPROPion (WELLBUTRIN XL) 150 MG 24 hr tablet Take 150 mg by mouth every morning.  10/30/14  Yes Historical Provider, MD  loratadine (CLARITIN) 10 MG tablet Take 10-20 mg by mouth daily as needed for allergies.    Yes Historical Provider, MD  metoprolol succinate (TOPROL-XL) 25 MG 24 hr tablet Take 25 mg by mouth every morning.  11/07/14  Yes Historical Provider, MD  Multiple Vitamin (MULTIVITAMIN WITH MINERALS) TABS tablet Take 1 tablet by mouth every morning.   Yes Historical Provider, MD  PROAIR HFA 108 (90 BASE) MCG/ACT inhaler Inhale 1 puff into the lungs every 4 (four) hours as needed for wheezing or shortness of breath.  10/16/14  Yes Historical Provider, MD     Vital Signs: BP 123/71 mmHg  Pulse 67  Temp(Src) 98.3 F (36.8 C) (Oral)  Resp 16  Ht 5' 2.5" (1.588 m)  Wt 85 lb 5.1 oz (38.7 kg)  BMI 15.35 kg/m2  SpO2 94%  Physical Exam pt awake/alert; left chest tube intact, no air leak  Imaging: Ir Lenise Arena W Catheter Placement  01/05/2015   CLINICAL DATA:  64 year old male with a history of left-sided lung carcinoma. The patient has undergone CT-guided biopsy 12/28/2014, with no subsequent pneumothorax on the day of the procedure.  The patient presented 01/04/2015 for initiation of radiation  therapy, with discovery of a left-sided pneumothorax.  The patient has been admitted for observation with left-sided chest tube placement for evacuation of asymptomatic pneumothorax.  EXAM: INSERTION OF PLEURAL DRAINAGE CATHETER  ANESTHESIA/SEDATION: Zero mg IV Versed; 0 mcg IV Fentanyl.  Total Moderate Sedation Time  Zero minutes.  MEDICATIONS: None  FLUOROSCOPY TIME:  18 seconds  PROCEDURE: The procedure, risks, benefits, and alternatives were explained to the patient and to the patient's family. Questions regarding the procedure were encouraged and answered.  The patient understands and consents to the procedure.  Scout image of the left chest was performed with the patient in left anterior oblique position on the fluoroscopy table.  The mid axillary region at the level of the nipple was prepped and draped in the usual sterile fashion.  The skin and subcutaneous tissues were generously infiltrated with 1% lidocaine without epinephrine for local anesthesia. Tissues were anesthetized to the level of the pleura.  A small stab incision was made with an 11 blade scalpel. Yueh needle was advanced into the pleural space. With aspiration of air, the catheter was advanced into the pleural space. An 035 wire was advanced towards the apex of the left chest, the catheter was removed, and then a 10 French pigtail catheter was advanced over the wire to the apex of the left chest cavity.  Pigtail catheter was formed, final image was stored, and the catheter was sutured in position.  The chest tube was then placed on pneumo  VAC device and wall suction at 20 cm of water pressure.  The patient tolerated the procedure well and remained hemodynamically stable throughout.  No complications were encountered and no significant blood loss was encountered.  .  COMPLICATIONS: None.  FINDINGS: The catheter was placed via the left lateral chest wall. Catheter course is towards the apex.  IMPRESSION: Status post left-sided thoracostomy tube  placement for left-sided pneumothorax.  Signed,  Dulcy Fanny. Earleen Newport, DO  [facility]  [facility]  PLAN: The catheter will be placed to suction overnight, with a chest x-ray in the morning at 6 a.m. Thereafter should there be no pneumothorax, chest tube may be placed on water seal for several hours. After repeat chest x-ray, the catheter may be removed if no pneumothorax develops.   Electronically Signed   By: Corrie Mckusick D.O.   On: 01/05/2015 08:21   Dg Chest 1v Repeat Same Day  01/05/2015   CLINICAL DATA:  Chest tube placed on water seal for 3 hours  EXAM: CHEST - 1 VIEW SAME DAY  COMPARISON:  Study obtained earlier in the day  FINDINGS: Chest catheter remains in the left apex region.  No pneumothorax.  Lungs are hyperexpanded. No edema or consolidation. Heart size and pulmonary vascularity are normal. No adenopathy. The previously noted left upper lobe nodule is not well seen on this study. There are scattered areas of mild scarring bilaterally which are stable.  IMPRESSION: No change in left chest catheter position. No pneumothorax. No edema or consolidation. No new opacity. No change in cardiac silhouette.   Electronically Signed   By: Lowella Grip III M.D.   On: 01/05/2015 10:33   Dg Chest Port 1 View  01/06/2015   CLINICAL DATA:  Follow-up pneumothorax  EXAM: PORTABLE CHEST - 1 VIEW  COMPARISON:  01/05/2015  FINDINGS: Left apical pigtail drain.  No pneumothorax is seen.  Chronic interstitial markings/emphysematous changes. Hyperinflation. No pleural effusion.  The heart is normal in size.  IMPRESSION: Stable left apical pigtail drain.  No pneumothorax is seen.   Electronically Signed   By: Julian Hy M.D.   On: 01/06/2015 08:18   Dg Chest Port 1 View  01/05/2015   CLINICAL DATA:  Increase cough following lung biopsy  EXAM: PORTABLE CHEST - 1 VIEW  COMPARISON:  Study obtained earlier in the day  FINDINGS: Left chest catheter remains in place.  No demonstrable pneumothorax. The left upper lobe nodular lesion recently biopsied is not well seen. There is no edema or consolidation. No new opacity. Heart size and pulmonary vascularity normal. No adenopathy.  IMPRESSION: No change from study obtained earlier in the day. No edema or consolidation. No change in chest catheter position on the left. No pneumothorax.   Electronically Signed   By: Lowella Grip III M.D.   On: 01/05/2015 15:52   Dg Chest Port 1 View  01/05/2015   CLINICAL DATA:  Chest 2  EXAM: PORTABLE CHEST - 1 VIEW  COMPARISON:  12/28/2014  FINDINGS: Left chest tube is in place. There is no pneumothorax. Lungs remain hyperaerated. Left midlung nodule. Right basilar granuloma. Normal heart size.  IMPRESSION: Left chest tube in place.  No pneumothorax.   Electronically Signed   By: Marybelle Killings M.D.   On: 01/05/2015 07:46    Labs:  CBC:  Recent Labs  11/20/14 1352 12/28/14 1000 01/04/15 1555  WBC 4.3 5.0 4.7  HGB 14.0 13.6 13.9  HCT 42.2 38.6* 40.2  PLT 206  231 292    COAGS:  Recent Labs  12/28/14 1000  INR 0.97  APTT 33    BMP:  Recent Labs  11/20/14 1352 01/04/15 1555 01/05/15 0405  NA 135* 136 132*  K 4.5 3.8 3.3*  CL  --  100* 98*  CO2 '22 26 27  '$ GLUCOSE 100 75 99  BUN 10.'9 8 14  '$ CALCIUM 9.3 9.6 8.4*  CREATININE 0.8 0.61 1.02  GFRNONAA  --  >60 >60  GFRAA  --  >60 >60    LIVER FUNCTION TESTS:  Recent Labs  11/20/14 1352 01/05/15 0405  BILITOT 0.29 0.5  AST 39* 26  ALT 20 12*  ALKPHOS 131 79  PROT 7.3 6.3*  ALBUMIN 3.6 3.3*    Assessment and Plan: Pt with hx adenocarcinoma left lung, s/p bx 12/28/14 with delayed left pneumothorax and chest tube placement 7/28; CXR today after 24 hours on water seal reveals no ptx; case reviewed with Dr. Earleen Newport and decision made to remove chest tube- tube removed in its entirety without immediate complications. Vaseline gauze dressing applied to site. Ok to d/c home from [employer] perspective; pt reminded to  avoid strenuous activity and report to ED if he experiences increased CP or worsening dyspnea; our contact number over weekend is [phone] and pt should ask to speak with IR MD on call if needed. Greatly appreciate [facility] assistance.    Signed: D. Rowe Robert 01/06/2015, 9:02 AM   I spent a total of 15 minutes in face to face in clinical consultation/evaluation, greater than 50% of which was counseling/coordinating care for left chest tube placement

## 2015-01-09 ENCOUNTER — Telehealth: Payer: Self-pay | Admitting: *Deleted

## 2015-01-09 NOTE — Telephone Encounter (Signed)
Oncology Nurse Navigator Documentation  Oncology Nurse Navigator Flowsheets 01/09/2015  Navigator Encounter Type Telephone/I called to check on [name] and how he was doing with treatment.  Apparently, he has not received treatment due to pneurmo noted during [facility].  I listened as he explained what happened.  He states he feels great now and would like to start tx soon.  I stated I would ask Dr. Tammi Klippel when he could start.  He was thankful for the call.   Treatment Phase Abnormal Scans  Barriers/Navigation Needs Complications of disease   Interventions Coordination of Care  Coordination of Care MD Appointments  Time Spent with Patient 15

## 2015-01-18 ENCOUNTER — Ambulatory Visit: Payer: Commercial Managed Care - HMO | Admitting: Radiation Oncology

## 2015-01-18 ENCOUNTER — Ambulatory Visit
Admission: RE | Admit: 2015-01-18 | Discharge: 2015-01-18 | Disposition: A | Discharge status: Home / Self Care | Payer: Commercial Managed Care - HMO | Source: Ambulatory Visit | Attending: Radiation Oncology | Admitting: Radiation Oncology

## 2015-01-18 DIAGNOSIS — C3412 Malignant neoplasm of upper lobe, left bronchus or lung: Secondary | ICD-10-CM | POA: Diagnosis not present

## 2015-01-18 DIAGNOSIS — Z510 Encounter for antineoplastic radiation therapy: Secondary | ICD-10-CM | POA: Diagnosis not present

## 2015-01-18 NOTE — Progress Notes (Signed)
  Radiation Oncology         [phone] ________________________________  Name: [name] MRN: [id number]  Date: 01/18/2015  DOB: 11-19-1950  STEREOTACTIC BODY RADIOTHERAPY SIMULATION AND TREATMENT PLANNING NOTE    ICD-9-CM ICD-10-CM   1. 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  Identity was confirmed.  All relevant records and images related to the planned course of therapy were reviewed.  The patient freely provided informed written consent to proceed with treatment after reviewing the details related to the planned course of therapy. The consent form was witnessed and verified by the simulation staff.  Then, the patient was set-up in a stable reproducible  supine position for radiation therapy.  A BodyFix immobilization pillow was fabricated for reproducible positioning.  Then I personally applied the abdominal compression paddle to limit respiratory excursion.  4D respiratoy motion management CT images were obtained.  Surface markings were placed.  The CT images were loaded into the planning software.  Then, using Cine, MIP, and standard views, the internal target volume (ITV) and planning target volumes (PTV) were delinieated, and avoidance structures were contoured.  Treatment planning then occurred.  The radiation prescription was entered and confirmed.  A total of two complex treatment devices were fabricated in the form of the BodyFix immobilization pillow and a neck accuform cushion.  I have requested : 3D Simulation  I have requested a DVH of the following structures: Heart, Lungs, Esophagus, Chest Wall, Brachial Plexus, Major Blood Vessels, and targets.  PLAN:  The patient will receive 54 Gy in 3 fraction.  ________________________________  Sheral Apley Tammi Klippel, M.D.

## 2015-01-19 ENCOUNTER — Ambulatory Visit: Payer: Commercial Managed Care - HMO | Admitting: Radiation Oncology

## 2015-01-22 ENCOUNTER — Ambulatory Visit: Payer: Commercial Managed Care - HMO | Admitting: Radiation Oncology

## 2015-01-25 ENCOUNTER — Ambulatory Visit: Payer: Commercial Managed Care - HMO | Admitting: Radiation Oncology

## 2015-01-29 ENCOUNTER — Ambulatory Visit: Payer: Commercial Managed Care - HMO | Admitting: Radiation Oncology

## 2015-01-31 ENCOUNTER — Ambulatory Visit: Payer: Commercial Managed Care - HMO | Admitting: Radiation Oncology

## 2015-01-31 DIAGNOSIS — Z510 Encounter for antineoplastic radiation therapy: Secondary | ICD-10-CM | POA: Diagnosis not present

## 2015-01-31 DIAGNOSIS — C3412 Malignant neoplasm of upper lobe, left bronchus or lung: Secondary | ICD-10-CM | POA: Diagnosis not present

## 2015-02-01 ENCOUNTER — Ambulatory Visit
Admission: RE | Admit: 2015-02-01 | Discharge: 2015-02-01 | Disposition: A | Discharge status: Home / Self Care | Payer: Commercial Managed Care - HMO | Source: Ambulatory Visit | Attending: Radiation Oncology | Admitting: Radiation Oncology

## 2015-02-01 ENCOUNTER — Ambulatory Visit
Admission: RE | Admit: 2015-02-01 | Discharge: 2015-02-01 | Disposition: A | Discharge status: Home / Self Care | Payer: Medicare Other | Source: Ambulatory Visit | Attending: Radiation Oncology | Admitting: Radiation Oncology

## 2015-02-01 DIAGNOSIS — C3412 Malignant neoplasm of upper lobe, left bronchus or lung: Secondary | ICD-10-CM | POA: Diagnosis not present

## 2015-02-01 DIAGNOSIS — Z510 Encounter for antineoplastic radiation therapy: Secondary | ICD-10-CM | POA: Diagnosis not present

## 2015-02-02 DIAGNOSIS — C3412 Malignant neoplasm of upper lobe, left bronchus or lung: Secondary | ICD-10-CM | POA: Diagnosis not present

## 2015-02-02 DIAGNOSIS — Z510 Encounter for antineoplastic radiation therapy: Secondary | ICD-10-CM | POA: Diagnosis not present

## 2015-02-05 ENCOUNTER — Ambulatory Visit
Admission: RE | Admit: 2015-02-05 | Discharge: 2015-02-05 | Disposition: A | Discharge status: Home / Self Care | Payer: Commercial Managed Care - HMO | Source: Ambulatory Visit | Attending: Radiation Oncology | Admitting: Radiation Oncology

## 2015-02-05 DIAGNOSIS — Z510 Encounter for antineoplastic radiation therapy: Secondary | ICD-10-CM | POA: Diagnosis not present

## 2015-02-05 DIAGNOSIS — C3412 Malignant neoplasm of upper lobe, left bronchus or lung: Secondary | ICD-10-CM | POA: Diagnosis not present

## 2015-02-08 ENCOUNTER — Encounter: Payer: Self-pay | Admitting: *Deleted

## 2015-02-08 ENCOUNTER — Ambulatory Visit
Admission: RE | Admit: 2015-02-08 | Discharge: 2015-02-08 | Disposition: A | Discharge status: Home / Self Care | Payer: Commercial Managed Care - HMO | Source: Ambulatory Visit | Attending: Radiation Oncology | Admitting: Radiation Oncology

## 2015-02-08 ENCOUNTER — Encounter: Payer: Self-pay | Admitting: Radiation Oncology

## 2015-02-08 DIAGNOSIS — Z510 Encounter for antineoplastic radiation therapy: Secondary | ICD-10-CM | POA: Diagnosis not present

## 2015-02-08 DIAGNOSIS — C3412 Malignant neoplasm of upper lobe, left bronchus or lung: Secondary | ICD-10-CM | POA: Diagnosis not present

## 2015-02-08 NOTE — Progress Notes (Signed)
Oncology Nurse Navigator Documentation  Oncology Nurse Navigator Flowsheets 02/08/2015  Navigator Encounter Type Other;Clinic/MDC/I received a call from [name] in [place] today.  Patient and girlfriend had questions.  I spoke with them and explained about his dx, tx, and follow up.    Patient Visit Type Follow-up  Treatment Phase Treatment  Barriers/Navigation Needs Education   Interventions Information given and explained   Time Spent with Patient 30

## 2015-02-08 NOTE — Progress Notes (Signed)
Received patient in the clinic following final radiation treatment. Provided patient with one month follow up appointment card. Encouraged patient to contact this RN with future needs. Patient and girlfriend had numerous questions. Answered the ones I could to the best of my ability. Reach out to Norton Blizzard, RN to answer the rest. Patient verbalized understanding of all reviewed.

## 2015-02-14 NOTE — Progress Notes (Signed)
  Radiation Oncology         [phone] ________________________________  Name: [name] MRN: [id number]  Date: 02/08/2015  DOB: 07-03-1950  End of Treatment Note    ICD-9-CM ICD-10-CM   1. 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  Volumetric arc fields were employed to deliver 6 MV X-rays.  Image guidance was performed with per fraction cone beam CT prior to treatment under personal MD supervision.  Immobilization was achieved using BodyFix Pillow.  Narrative: The patient tolerated radiation treatment relatively well.   No complications occurred.  Plan: The patient has completed radiation treatment. The patient will return to radiation oncology clinic for routine followup in one month. I advised him to call or return sooner if he has any questions or concerns related to his recovery or treatment. ________________________________  Sheral Apley. Tammi Klippel, M.D.

## 2015-02-19 DIAGNOSIS — K7689 Other specified diseases of liver: Secondary | ICD-10-CM | POA: Diagnosis not present

## 2015-02-19 DIAGNOSIS — R739 Hyperglycemia, unspecified: Secondary | ICD-10-CM | POA: Diagnosis not present

## 2015-02-19 DIAGNOSIS — I10 Essential (primary) hypertension: Secondary | ICD-10-CM | POA: Diagnosis not present

## 2015-02-22 DIAGNOSIS — D649 Anemia, unspecified: Secondary | ICD-10-CM | POA: Diagnosis not present

## 2015-02-22 DIAGNOSIS — R739 Hyperglycemia, unspecified: Secondary | ICD-10-CM | POA: Diagnosis not present

## 2015-02-22 DIAGNOSIS — C801 Malignant (primary) neoplasm, unspecified: Secondary | ICD-10-CM | POA: Diagnosis not present

## 2015-02-22 DIAGNOSIS — Z23 Encounter for immunization: Secondary | ICD-10-CM | POA: Diagnosis not present

## 2015-02-22 DIAGNOSIS — J449 Chronic obstructive pulmonary disease, unspecified: Secondary | ICD-10-CM | POA: Diagnosis not present

## 2015-02-22 DIAGNOSIS — R972 Elevated prostate specific antigen [PSA]: Secondary | ICD-10-CM | POA: Diagnosis not present

## 2015-02-22 DIAGNOSIS — I10 Essential (primary) hypertension: Secondary | ICD-10-CM | POA: Diagnosis not present

## 2015-02-27 DIAGNOSIS — R972 Elevated prostate specific antigen [PSA]: Secondary | ICD-10-CM | POA: Diagnosis not present

## 2015-02-27 DIAGNOSIS — N5201 Erectile dysfunction due to arterial insufficiency: Secondary | ICD-10-CM | POA: Diagnosis not present

## 2015-03-02 ENCOUNTER — Telehealth: Payer: Self-pay | Admitting: *Deleted

## 2015-03-02 NOTE — Telephone Encounter (Signed)
Oncology Nurse Navigator Documentation  Oncology Nurse Navigator Flowsheets 03/02/2015  Navigator Encounter Type Telephone/called to follow up with patient.  He has completed radiation therapy.  He stated he is doing well without complaints.  I reminded him of his appt next week with Dr. Tammi Klippel.  I asked that he call if needed.    Patient Visit Type Follow-up  Treatment Phase Other  Barriers/Navigation Needs none  Interventions none  Coordination of Care none  Time Spent with Patient 15

## 2015-03-08 ENCOUNTER — Encounter: Payer: Self-pay | Admitting: Radiation Oncology

## 2015-03-08 ENCOUNTER — Ambulatory Visit
Admission: RE | Admit: 2015-03-08 | Discharge: 2015-03-08 | Disposition: A | Discharge status: Home / Self Care | Payer: Commercial Managed Care - HMO | Source: Ambulatory Visit | Attending: Radiation Oncology | Admitting: Radiation Oncology

## 2015-03-08 VITALS — BP 140/74 | HR 63 | Resp 18 | Wt 91.9 lb

## 2015-03-08 DIAGNOSIS — C3412 Malignant neoplasm of upper lobe, left bronchus or lung: Secondary | ICD-10-CM

## 2015-03-08 NOTE — Progress Notes (Signed)
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 Cancer of upper lobe of left lung 162.3 C34.12     Interval Since Last Radiation:  1  months  Narrative:  The patient returns today for routine follow-up.  Weight and vitals stable. Denies chest pain. Denies painful or difficulty swallowing. Reports SOB has greatly improved. Reports his cough is mostly dry except for first thing in the morning when he produces clear sputum. His wife reports that he coughs during the night. Denies hemoptysis. Reports energy level has greatly improved.                              ALLERGIES:  is allergic to chantix and prevacid.  Meds: Current Outpatient Prescriptions  Medication Sig Dispense Refill  . amLODipine (NORVASC) 10 MG tablet Take 10 mg by mouth every morning.     Marland Kitchen buPROPion (WELLBUTRIN XL) 150 MG 24 hr tablet Take 150 mg by mouth every morning.     Marland Kitchen ipratropium (ATROVENT) 0.02 % nebulizer solution     . loratadine (CLARITIN) 10 MG tablet Take 10-20 mg by mouth daily as needed for allergies.     . metoprolol succinate (TOPROL-XL) 25 MG 24 hr tablet Take 25 mg by mouth every morning.     . Multiple Vitamin (MULTIVITAMIN WITH MINERALS) TABS tablet Take 1 tablet by mouth every morning.    . Nutritional Supplements (CARNATION BREAKFAST ESSENTIALS PO) Take by mouth.    Marland Kitchen PROAIR HFA 108 (90 BASE) MCG/ACT inhaler Inhale 1 puff into the lungs every 4 (four) hours as needed for wheezing or shortness of breath.     Marland Kitchen albuterol (PROVENTIL) (2.5 MG/3ML) 0.083% nebulizer solution Take 2.5 mg by nebulization every 6 (six) hours as needed for wheezing or shortness of breath.    . feeding supplement, ENSURE ENLIVE,  (ENSURE ENLIVE) LIQD Take 237 mLs by mouth 2 (two) times daily between meals. (Patient not taking: Reported on 03/08/2015) 237 mL 12  . oxyCODONE (OXY IR/ROXICODONE) 5 MG immediate release tablet Take 1 tablet (5 mg total) by mouth every 4 (four) hours as needed for moderate pain. (Patient not taking: Reported on 03/08/2015) 20 tablet 0   No current facility-administered medications for this encounter.    Physical Findings: The patient is in no acute distress. Patient is alert and oriented.  weight is 91 lb 14.4 oz (41.686 kg). His blood pressure is 140/74 and his pulse is 63. His respiration is 18 and oxygen saturation is 100%. .  No significant changes.  Impression:  The patient is recovering from the effects of radiation.     Plan:  I have advised the patient that Mucinex D or Robitussin DM may alleviate his coughing at night, being sure to stay hydrated while using these medications.  CT scan scheduled in 2 weeks with follow up to discuss the results.  This document serves as a record of services personally performed by Tyler Pita, MD. It was created on his behalf by Arlyce Harman, a trained medical scribe. The creation of this record is based on the scribe's personal observations and the provider's statements to  them. This document has been checked and approved by the attending provider.    _____________________________________  Sheral Apley. Tammi Klippel, M.D.

## 2015-03-08 NOTE — Progress Notes (Addendum)
Weight and vitals stable. Denies chest pain. Denies painful or difficulty swallowing. Reports SOB has greatly improved. Reports his cough is mostly dry except for first thing in he morning when he produces clear sputum. Denies hemoptysis. Reports energy level has greatly improved.  BP 140/74 mmHg  Pulse 63  Resp 18  Wt 91 lb 14.4 oz (41.686 kg)  SpO2 100% Wt Readings from Last 3 Encounters:  03/08/15 91 lb 14.4 oz (41.686 kg)  02/08/15 91 lb 14.4 oz (41.686 kg)  01/04/15 85 lb 5.1 oz (38.7 kg)

## 2015-03-09 ENCOUNTER — Telehealth: Payer: Self-pay | Admitting: *Deleted

## 2015-03-09 NOTE — Telephone Encounter (Signed)
CALLED PATIENT TO INFORM OF CT AND FU, LVM FOR A RETURN CALL

## 2015-03-15 ENCOUNTER — Ambulatory Visit: Payer: Self-pay | Admitting: Radiation Oncology

## 2015-03-22 ENCOUNTER — Ambulatory Visit (HOSPITAL_COMMUNITY): Payer: Commercial Managed Care - HMO

## 2015-04-03 ENCOUNTER — Encounter (HOSPITAL_COMMUNITY): Payer: Self-pay

## 2015-04-03 ENCOUNTER — Ambulatory Visit (HOSPITAL_COMMUNITY)
Admission: RE | Admit: 2015-04-03 | Discharge: 2015-04-03 | Disposition: A | Discharge status: Home / Self Care | Payer: Commercial Managed Care - HMO | Source: Ambulatory Visit | Attending: Radiation Oncology | Admitting: Radiation Oncology

## 2015-04-03 DIAGNOSIS — I2510 Atherosclerotic heart disease of native coronary artery without angina pectoris: Secondary | ICD-10-CM | POA: Insufficient documentation

## 2015-04-03 DIAGNOSIS — Z923 Personal history of irradiation: Secondary | ICD-10-CM | POA: Insufficient documentation

## 2015-04-03 DIAGNOSIS — K861 Other chronic pancreatitis: Secondary | ICD-10-CM | POA: Diagnosis not present

## 2015-04-03 DIAGNOSIS — C3492 Malignant neoplasm of unspecified part of left bronchus or lung: Secondary | ICD-10-CM | POA: Diagnosis not present

## 2015-04-03 DIAGNOSIS — R918 Other nonspecific abnormal finding of lung field: Secondary | ICD-10-CM | POA: Insufficient documentation

## 2015-04-03 DIAGNOSIS — C3412 Malignant neoplasm of upper lobe, left bronchus or lung: Secondary | ICD-10-CM | POA: Diagnosis not present

## 2015-04-03 HISTORY — DX: Malignant (primary) neoplasm, unspecified: C80.1

## 2015-04-04 ENCOUNTER — Telehealth: Payer: Self-pay | Admitting: *Deleted

## 2015-04-04 ENCOUNTER — Telehealth: Payer: Self-pay | Admitting: Radiation Oncology

## 2015-04-04 DIAGNOSIS — C3412 Malignant neoplasm of upper lobe, left bronchus or lung: Secondary | ICD-10-CM

## 2015-04-04 NOTE — Telephone Encounter (Signed)
Per Dr. Johny Shears order phoned patient with CT results. Patient understands his tumor has responded well to treatment and decreased in size. Patient reports he has had a productive cough with clear sputum for sometime. Explains he take over the counter DM medication at night to relieve said cough. Encouraged patient to follow up with PCP about cough and possible bronchitis noted on scan. Patient denies need to see Dr. Tammi Klippel at this time. Patient understands Romie Jumper will contact him with appointment for scan and follow up for six months.

## 2015-04-04 NOTE — Telephone Encounter (Signed)
CALLED PATIENT TO INFORM OF CT ON 10-03-15 @ 8:30 AM @ [facility] AND HIS FU VISIT WITH DR. [name] ON 10-04-15 @ 11:30 AM, SPOKE WITH PATIENT AND HE IS AWARE OF THESE APPTS.

## 2015-04-05 ENCOUNTER — Ambulatory Visit: Payer: Commercial Managed Care - HMO | Admitting: Radiation Oncology

## 2015-04-10 DIAGNOSIS — N5201 Erectile dysfunction due to arterial insufficiency: Secondary | ICD-10-CM | POA: Diagnosis not present

## 2015-04-10 DIAGNOSIS — R972 Elevated prostate specific antigen [PSA]: Secondary | ICD-10-CM | POA: Diagnosis not present

## 2015-04-19 DIAGNOSIS — D649 Anemia, unspecified: Secondary | ICD-10-CM | POA: Diagnosis not present

## 2015-04-24 DIAGNOSIS — D649 Anemia, unspecified: Secondary | ICD-10-CM | POA: Diagnosis not present

## 2015-04-24 DIAGNOSIS — R05 Cough: Secondary | ICD-10-CM | POA: Diagnosis not present

## 2015-06-28 ENCOUNTER — Other Ambulatory Visit: Payer: Self-pay | Admitting: Radiation Oncology

## 2015-07-02 DIAGNOSIS — Z720 Tobacco use: Secondary | ICD-10-CM | POA: Diagnosis not present

## 2015-07-02 DIAGNOSIS — R05 Cough: Secondary | ICD-10-CM | POA: Diagnosis not present

## 2015-08-16 DIAGNOSIS — I10 Essential (primary) hypertension: Secondary | ICD-10-CM | POA: Diagnosis not present

## 2015-08-16 DIAGNOSIS — D649 Anemia, unspecified: Secondary | ICD-10-CM | POA: Diagnosis not present

## 2015-08-22 DIAGNOSIS — R972 Elevated prostate specific antigen [PSA]: Secondary | ICD-10-CM | POA: Diagnosis not present

## 2015-08-22 DIAGNOSIS — C3490 Malignant neoplasm of unspecified part of unspecified bronchus or lung: Secondary | ICD-10-CM | POA: Diagnosis not present

## 2015-08-22 DIAGNOSIS — I10 Essential (primary) hypertension: Secondary | ICD-10-CM | POA: Diagnosis not present

## 2015-08-22 DIAGNOSIS — J449 Chronic obstructive pulmonary disease, unspecified: Secondary | ICD-10-CM | POA: Diagnosis not present

## 2015-08-22 DIAGNOSIS — Z720 Tobacco use: Secondary | ICD-10-CM | POA: Diagnosis not present

## 2015-09-05 DIAGNOSIS — L719 Rosacea, unspecified: Secondary | ICD-10-CM | POA: Diagnosis not present

## 2015-09-05 DIAGNOSIS — D485 Neoplasm of uncertain behavior of skin: Secondary | ICD-10-CM | POA: Diagnosis not present

## 2015-09-05 DIAGNOSIS — L570 Actinic keratosis: Secondary | ICD-10-CM | POA: Diagnosis not present

## 2015-09-05 DIAGNOSIS — D2239 Melanocytic nevi of other parts of face: Secondary | ICD-10-CM | POA: Diagnosis not present

## 2015-10-03 ENCOUNTER — Encounter (HOSPITAL_COMMUNITY): Payer: Self-pay

## 2015-10-03 ENCOUNTER — Ambulatory Visit (HOSPITAL_COMMUNITY)
Admission: RE | Admit: 2015-10-03 | Discharge: 2015-10-03 | Disposition: A | Discharge status: Home / Self Care | Payer: Commercial Managed Care - HMO | Source: Ambulatory Visit | Attending: Radiation Oncology | Admitting: Radiation Oncology

## 2015-10-03 DIAGNOSIS — I700 Atherosclerosis of aorta: Secondary | ICD-10-CM | POA: Insufficient documentation

## 2015-10-03 DIAGNOSIS — R590 Localized enlarged lymph nodes: Secondary | ICD-10-CM | POA: Diagnosis not present

## 2015-10-03 DIAGNOSIS — C3490 Malignant neoplasm of unspecified part of unspecified bronchus or lung: Secondary | ICD-10-CM | POA: Diagnosis not present

## 2015-10-03 DIAGNOSIS — I2510 Atherosclerotic heart disease of native coronary artery without angina pectoris: Secondary | ICD-10-CM | POA: Diagnosis not present

## 2015-10-03 DIAGNOSIS — C3412 Malignant neoplasm of upper lobe, left bronchus or lung: Secondary | ICD-10-CM | POA: Insufficient documentation

## 2015-10-04 ENCOUNTER — Telehealth: Payer: Self-pay | Admitting: Radiation Oncology

## 2015-10-04 ENCOUNTER — Telehealth: Payer: Self-pay | Admitting: *Deleted

## 2015-10-04 ENCOUNTER — Ambulatory Visit
Admission: RE | Admit: 2015-10-04 | Discharge: 2015-10-04 | Disposition: A | Discharge status: Home / Self Care | Payer: Commercial Managed Care - HMO | Source: Ambulatory Visit | Attending: Radiation Oncology | Admitting: Radiation Oncology

## 2015-10-04 DIAGNOSIS — C3412 Malignant neoplasm of upper lobe, left bronchus or lung: Secondary | ICD-10-CM

## 2015-10-04 NOTE — Telephone Encounter (Signed)
Patient did not show for 1030 appointment with Dr. Tammi Klippel to obtain CT results. Phoned patient's home to inquire. Patient states, I thought my appointment was 1130. Informed Dr. Tammi Klippel of these findings. Explained to the patient his CT scan proved no recurrence or active cancer in his nodes or lungs. Explained the scan does show some scarring within the radiation treatment field which is normal. Patient verbalized understanding. Patient without complaints or needs at this time. Patient agrees to be seen in six months follow CT scan. Patient understands to contact this RN with future needs.

## 2015-10-04 NOTE — Telephone Encounter (Signed)
CALLED PATIENT TO INFORM THAT SCAN HAS BEEN MOVED TO 04-02-16 @ 10 AM @ [facility], LVM FOR A RETURN CALL

## 2015-10-04 NOTE — Telephone Encounter (Signed)
CALLED PATIENT TO INFORM OF CT FOR 04-01-16 @ [facility] AND HIS FU ON 04-03-16 @ 10:45 AM WITH DR. [name] TO GET RESULTS, SPOKE WITH PATIENT AND HE IS AWARE OF THESE APPTS.

## 2016-02-13 DIAGNOSIS — Z125 Encounter for screening for malignant neoplasm of prostate: Secondary | ICD-10-CM | POA: Diagnosis not present

## 2016-02-13 DIAGNOSIS — I10 Essential (primary) hypertension: Secondary | ICD-10-CM | POA: Diagnosis not present

## 2016-02-13 DIAGNOSIS — E871 Hypo-osmolality and hyponatremia: Secondary | ICD-10-CM | POA: Diagnosis not present

## 2016-02-20 DIAGNOSIS — K7689 Other specified diseases of liver: Secondary | ICD-10-CM | POA: Diagnosis not present

## 2016-02-20 DIAGNOSIS — Z23 Encounter for immunization: Secondary | ICD-10-CM | POA: Diagnosis not present

## 2016-02-20 DIAGNOSIS — I10 Essential (primary) hypertension: Secondary | ICD-10-CM | POA: Diagnosis not present

## 2016-03-26 DIAGNOSIS — J069 Acute upper respiratory infection, unspecified: Secondary | ICD-10-CM | POA: Diagnosis not present

## 2016-04-01 ENCOUNTER — Ambulatory Visit (HOSPITAL_COMMUNITY): Payer: Commercial Managed Care - HMO

## 2016-04-02 ENCOUNTER — Encounter (HOSPITAL_COMMUNITY): Payer: Self-pay

## 2016-04-02 ENCOUNTER — Telehealth: Payer: Self-pay | Admitting: Radiation Oncology

## 2016-04-02 ENCOUNTER — Ambulatory Visit (HOSPITAL_COMMUNITY)
Admission: RE | Admit: 2016-04-02 | Discharge: 2016-04-02 | Disposition: A | Discharge status: Home / Self Care | Payer: Medicare Other | Source: Ambulatory Visit | Attending: Radiation Oncology | Admitting: Radiation Oncology

## 2016-04-02 DIAGNOSIS — C3412 Malignant neoplasm of upper lobe, left bronchus or lung: Secondary | ICD-10-CM | POA: Diagnosis not present

## 2016-04-02 DIAGNOSIS — J439 Emphysema, unspecified: Secondary | ICD-10-CM | POA: Diagnosis not present

## 2016-04-02 DIAGNOSIS — R918 Other nonspecific abnormal finding of lung field: Secondary | ICD-10-CM | POA: Diagnosis not present

## 2016-04-02 DIAGNOSIS — I700 Atherosclerosis of aorta: Secondary | ICD-10-CM | POA: Diagnosis not present

## 2016-04-02 DIAGNOSIS — C3490 Malignant neoplasm of unspecified part of unspecified bronchus or lung: Secondary | ICD-10-CM | POA: Diagnosis not present

## 2016-04-02 DIAGNOSIS — I2510 Atherosclerotic heart disease of native coronary artery without angina pectoris: Secondary | ICD-10-CM | POA: Diagnosis not present

## 2016-04-02 DIAGNOSIS — R109 Unspecified abdominal pain: Secondary | ICD-10-CM | POA: Diagnosis not present

## 2016-04-02 NOTE — Telephone Encounter (Signed)
Returned call to patient to confirm next scheduled appointment. No voicemail box. Continuous ringing.

## 2016-04-03 ENCOUNTER — Ambulatory Visit: Admission: RE | Admit: 2016-04-03 | Payer: Medicare Other | Source: Ambulatory Visit | Admitting: Radiation Oncology

## 2016-04-03 ENCOUNTER — Telehealth: Payer: Self-pay | Admitting: *Deleted

## 2016-04-03 NOTE — Telephone Encounter (Signed)
Called Mr. Shifflett since he was a no show for his appointment. Today.  He stated he was not aware, and had not received a call regarding this appointment.  Shona Simpson, PA informed and his appointment has been rescheduled for tomorrow at 3:30pm and he agreed with this time frame.

## 2016-04-04 ENCOUNTER — Encounter: Payer: Self-pay | Admitting: Radiation Oncology

## 2016-04-04 ENCOUNTER — Ambulatory Visit
Admission: RE | Admit: 2016-04-04 | Discharge: 2016-04-04 | Disposition: A | Discharge status: Home / Self Care | Payer: Medicare Other | Source: Ambulatory Visit | Attending: Radiation Oncology | Admitting: Radiation Oncology

## 2016-04-04 DIAGNOSIS — Z8719 Personal history of other diseases of the digestive system: Secondary | ICD-10-CM | POA: Diagnosis not present

## 2016-04-04 DIAGNOSIS — I10 Essential (primary) hypertension: Secondary | ICD-10-CM | POA: Insufficient documentation

## 2016-04-04 DIAGNOSIS — F17210 Nicotine dependence, cigarettes, uncomplicated: Secondary | ICD-10-CM | POA: Diagnosis not present

## 2016-04-04 DIAGNOSIS — R109 Unspecified abdominal pain: Secondary | ICD-10-CM | POA: Insufficient documentation

## 2016-04-04 DIAGNOSIS — Z79899 Other long term (current) drug therapy: Secondary | ICD-10-CM | POA: Insufficient documentation

## 2016-04-04 DIAGNOSIS — Z85118 Personal history of other malignant neoplasm of bronchus and lung: Secondary | ICD-10-CM | POA: Diagnosis not present

## 2016-04-04 DIAGNOSIS — Z825 Family history of asthma and other chronic lower respiratory diseases: Secondary | ICD-10-CM | POA: Insufficient documentation

## 2016-04-04 DIAGNOSIS — J449 Chronic obstructive pulmonary disease, unspecified: Secondary | ICD-10-CM | POA: Insufficient documentation

## 2016-04-04 DIAGNOSIS — Z923 Personal history of irradiation: Secondary | ICD-10-CM | POA: Diagnosis not present

## 2016-04-04 DIAGNOSIS — Z888 Allergy status to other drugs, medicaments and biological substances status: Secondary | ICD-10-CM | POA: Insufficient documentation

## 2016-04-04 DIAGNOSIS — Z833 Family history of diabetes mellitus: Secondary | ICD-10-CM | POA: Diagnosis not present

## 2016-04-04 DIAGNOSIS — C3412 Malignant neoplasm of upper lobe, left bronchus or lung: Secondary | ICD-10-CM

## 2016-04-04 DIAGNOSIS — Z08 Encounter for follow-up examination after completed treatment for malignant neoplasm: Secondary | ICD-10-CM | POA: Diagnosis not present

## 2016-04-04 NOTE — Progress Notes (Addendum)
Mr [facility] here for FU for report on his recent CT scan of the chest from 04/02/16.  He continues to have a moist cough, with small production of clear phelgm.   He reports level 9 - 9.5 pain in his left lower abdominal region which started one week ago.  Still waiting on imaging study to be scheduled by Dr. [name].  He weighs 97.4 lbs today. VSS.  BP 106/72 (BP Location: Right Arm, Patient Position: Sitting, Cuff Size: Small) Comment (BP Location): axilla  Pulse 67   Temp 98.1 F (36.7 C) (Oral)   Resp 20   Ht 5' 2.5" (1.588 m)   Wt 87 lb 6.4 oz (39.6 kg)   SpO2 98%   BMI 15.73 kg/m    Wt Readings from Last 3 Encounters:  04/04/16 87 lb 6.4 oz (39.6 kg)  03/08/15 91 lb 14.4 oz (41.7 kg)  02/08/15 91 lb 14.4 oz (41.7 kg)

## 2016-04-07 ENCOUNTER — Other Ambulatory Visit: Payer: Self-pay | Admitting: Internal Medicine

## 2016-04-07 ENCOUNTER — Other Ambulatory Visit: Payer: Self-pay | Admitting: Radiation Oncology

## 2016-04-07 DIAGNOSIS — R109 Unspecified abdominal pain: Secondary | ICD-10-CM

## 2016-04-07 DIAGNOSIS — C3412 Malignant neoplasm of upper lobe, left bronchus or lung: Secondary | ICD-10-CM

## 2016-04-07 NOTE — Progress Notes (Signed)
Radiation Oncology         [phone] ________________________________  Name: [name] MRN: [id number]  Date: 04/04/2016  DOB: 1950/10/10  Follow-Up Visit Note  CC: [name], MD  [name], MD  Diagnosis:   Synchronous Stage IA,  adenocarcinoma of the left upper lung.  Interval Since Last Radiation: 13 months  02/01/2015- 02/08/2015: left upper lung target was treated to 54 Gy in 3 fractions of 18 Gy  Narrative:  The patient returns today for routine follow-up.  In summary, this patient was treated with SBRT in 2016. He has been sent followed with serial imaging scans, and has been without evidence of disease. He recently underwent a CT scan of the chest with out contrast on 04/02/2016.  The index nodule in the left upper lobe is slightly decreased in size and is less solid in appearance. No progressive or new disease is noted. Stable subcentimeter mediastinal lymph nodes were again noted.                           On review of systems, the patient reports that he is doing well overall. He states that he has been experiencing a moist and slightly productive cough where he expels clear phlegm. He also has been experiencing trouble with left lower abdominal discomfort, and is awaiting a CT scan. Apparently he has a history of chronic pancreatitis, but has not had a flare of this in the last 10 years. He denies any chest pain, shortness of breath, fevers, chills, night sweats, unintended weight changes. He denies any bowel or bladder disturbances, and denies  nausea or vomiting. He denies any new musculoskeletal or joint aches or pains. A complete review of systems is obtained and is otherwise negative.   Past Medical History:  Past Medical History:  Diagnosis Date  . COPD (chronic obstructive pulmonary disease) ([place])   . Hypertension   . 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[place])   . lung ca dx'd 12/2014  . Pancreatitis    x2 stents placed to make patent duct to pancreas  . Status post  left foot surgery     Past Surgical History: Past Surgical History:  Procedure Laterality Date  . COLECTOMY    . COLOSTOMY    . COLOSTOMY REVERSAL    . EUS N/A 12/14/2014   Procedure: UPPER ENDOSCOPIC ULTRASOUND (EUS) LINEAR;  Surgeon: Milus Banister, MD;  Location: [facility];  Service: Endoscopy;  Laterality: N/A;  . FOOT SURGERY Left   . UPPER GI ENDOSCOPY    . VENTRAL HERNIA REPAIR      Social History:  Social History   Social History  . Marital status: Single    Spouse name: N/A  . Number of children: N/A  . Years of education: N/A   Occupational History  . Not on file.   Social History Main Topics  . Smoking status: Current Every Day Smoker    Packs/day: 2.00    Years: 50.00    Types: Cigarettes  . Smokeless tobacco: Not on file     Comment: currently 0.5 ppd, trying to quit  . Alcohol use 2.4 oz/week    4 Cans of beer per week     Comment: per day  . Drug use: No  . Sexual activity: Yes    Birth control/ protection: Injection   Other Topics Concern  . Not on file   Social History Narrative  . No narrative on file  Family History: Family History  Problem Relation Age of Onset  . COPD Mother   . Diabetes Father   . Cancer Brother     metastatic, unknown primary    ALLERGIES:  is allergic to chantix [varenicline] and prevacid [lansoprazole].  Meds: Current Outpatient Prescriptions  Medication Sig Dispense Refill  . albuterol (PROVENTIL) (2.5 MG/3ML) 0.083% nebulizer solution Take 2.5 mg by nebulization every 6 (six) hours as needed for wheezing or shortness of breath.    Marland Kitchen amLODipine (NORVASC) 10 MG tablet Take 10 mg by mouth every morning.     Marland Kitchen buPROPion (WELLBUTRIN XL) 150 MG 24 hr tablet Take 150 mg by mouth every morning.     Marland Kitchen ipratropium (ATROVENT) 0.02 % nebulizer solution     . loratadine (CLARITIN) 10 MG tablet Take 10-20 mg by mouth daily as needed for allergies.     . metoprolol succinate (TOPROL-XL) 25 MG 24 hr tablet Take 25 mg  by mouth every morning.     . Multiple Vitamin (MULTIVITAMIN WITH MINERALS) TABS tablet Take 1 tablet by mouth every morning.    . Nutritional Supplements (CARNATION BREAKFAST ESSENTIALS PO) Take by mouth.    Marland Kitchen omeprazole (PRILOSEC) 20 MG capsule Take 20 mg by mouth 2 (two) times daily before a meal.    . oxyCODONE (OXY IR/ROXICODONE) 5 MG immediate release tablet Take 1 tablet (5 mg total) by mouth every 4 (four) hours as needed for moderate pain. 20 tablet 0  . PROAIR HFA 108 (90 BASE) MCG/ACT inhaler Inhale 1 puff into the lungs every 4 (four) hours as needed for wheezing or shortness of breath.      No current facility-administered medications for this encounter.     Physical Findings:  height is 5' 2.5" (1.588 m) and weight is 87 lb 6.4 oz (39.6 kg). His oral temperature is 98.1 F (36.7 C). His blood pressure is 106/72 and his pulse is 67. His respiration is 20 and oxygen saturation is 98%.  In general this is a well appearing Caucasian male in no acute distress. He is alert and oriented x4 and appropriate throughout the examination. HEENT reveals that the patient is normocephalic, atraumatic. EOMs are intact. PERRLA. Skin is intact without any evidence of gross lesions. Cardiovascular exam reveals a regular rate and rhythm, no clicks rubs or murmurs are auscultated. Chest is clear to auscultation bilaterally. Lymphatic assessment is performed and does not reveal any adenopathy in the cervical, supraclavicular, axillary, or inguinal chains. Abdomen has active bowel sounds in all quadrants and is intact. The abdomen is soft, non tender, non distended. Lower extremities are negative for pretibial pitting edema, deep calf tenderness, cyanosis or clubbing.   Lab Findings: Lab Results  Component Value Date   WBC 4.7 01/04/2015   HGB 13.9 01/04/2015   HCT 40.2 01/04/2015   MCV 90.3 01/04/2015   PLT 292 01/04/2015     Radiographic Findings: Ct Chest Wo Contrast  Result Date:  04/02/2016 CLINICAL DATA:  Followup lung cancer EXAM: CT CHEST WITHOUT CONTRAST TECHNIQUE: Multidetector CT imaging of the chest was performed following the standard protocol without IV contrast. COMPARISON:  10/03/2015 FINDINGS: Cardiovascular: The heart size appears within normal limits. Aortic atherosclerosis noted. Calcification in the RCA, LAD coronary arteries noted. No pericardial effusion. Mediastinum/Nodes: The index right paratracheal lymph node measures 7 mm, image 65 of series 3. Previously 7 mm. The index left paratracheal lymph node is stable measuring 6 mm, image 61 of series 3. No adenopathy identified at  this time. The trachea appears patent and is midline. Normal appearance of the esophagus. Lungs/Pleura: No pleural fluid. Moderate changes of centrilobular emphysema. Diffuse bronchial wall thickening identified. Area of scarring and architectural distortion within the left upper lobe is again identified, image 53 of series 4. The index nodule referenced on previous exam appears less solid on today's study. This measures approximately 4 x 7 mm, image number 53 of series 4. Previously 5 x 8 mm. Nodule within the medial base of the left upper lobe measures 5 mm, image 108 of series 4. Unchanged from previous exam. Calcified granuloma identified within the right lower lobe. Upper Abdomen: No acute abnormality. Coarsened calcifications within the pancreas are identified compatible with chronic pancreatitis. No active inflammation. Musculoskeletal: No aggressive lytic or sclerotic bone lesions identified.Healing left rib fractures identified, image 57 of series 3. IMPRESSION: 1. Index nodule within the left upper lobe is slightly decreased in size in the interval in appears less solid. 2. No new or progressive disease identified within the chest. 3. Stable sub cm mediastinal lymph nodes 4. Aortic atherosclerosis and coronary artery calcification 5. Emphysema. Electronically Signed   By: Kerby Moors  M.D.   On: 04/02/2016 10:50    Impression/Plan: 1. Synchronous stage I A NCLC, adenocarcinoma of the left upper lobe of the lung. The patient appears to be clinically doing very well and radiographically without evidence of progression or new disease. He will continue to follow him in 6 months time with repeat imaging. He is encouraged to call if he has questions or concerns that arise prior visit. 2. Abdominal pain with a history of chronic pancreatitis. The patient does not have any acute findings on examination today but is encouraged to contact Dr. [name] for follow-up in reference to the imaging studies that they have discussed ordering. We will continue to follow this expectantly.     Carola Rhine, PAC

## 2016-04-08 ENCOUNTER — Other Ambulatory Visit: Payer: Medicare Other

## 2016-04-08 ENCOUNTER — Emergency Department (HOSPITAL_COMMUNITY)
Admission: EM | Admit: 2016-04-08 | Discharge: 2016-04-08 | Disposition: A | Discharge status: Home / Self Care | Payer: Medicare Other | Attending: Emergency Medicine | Admitting: Emergency Medicine

## 2016-04-08 ENCOUNTER — Emergency Department (HOSPITAL_COMMUNITY): Payer: Medicare Other

## 2016-04-08 ENCOUNTER — Encounter (HOSPITAL_COMMUNITY): Payer: Self-pay | Admitting: Family Medicine

## 2016-04-08 DIAGNOSIS — E871 Hypo-osmolality and hyponatremia: Secondary | ICD-10-CM | POA: Diagnosis not present

## 2016-04-08 DIAGNOSIS — J449 Chronic obstructive pulmonary disease, unspecified: Secondary | ICD-10-CM | POA: Insufficient documentation

## 2016-04-08 DIAGNOSIS — Z79899 Other long term (current) drug therapy: Secondary | ICD-10-CM | POA: Insufficient documentation

## 2016-04-08 DIAGNOSIS — I10 Essential (primary) hypertension: Secondary | ICD-10-CM | POA: Insufficient documentation

## 2016-04-08 DIAGNOSIS — R1012 Left upper quadrant pain: Secondary | ICD-10-CM

## 2016-04-08 DIAGNOSIS — F17210 Nicotine dependence, cigarettes, uncomplicated: Secondary | ICD-10-CM | POA: Insufficient documentation

## 2016-04-08 DIAGNOSIS — K861 Other chronic pancreatitis: Secondary | ICD-10-CM | POA: Diagnosis not present

## 2016-04-08 DIAGNOSIS — Z85118 Personal history of other malignant neoplasm of bronchus and lung: Secondary | ICD-10-CM | POA: Insufficient documentation

## 2016-04-08 LAB — URINALYSIS, ROUTINE W REFLEX MICROSCOPIC
Bilirubin Urine: NEGATIVE
GLUCOSE, UA: 100 mg/dL — AB
Hgb urine dipstick: NEGATIVE
KETONES UR: NEGATIVE mg/dL
LEUKOCYTES UA: NEGATIVE
Nitrite: NEGATIVE
PH: 7.5 (ref 5.0–8.0)
Protein, ur: >300 mg/dL — AB
Specific Gravity, Urine: 1.013 (ref 1.005–1.030)

## 2016-04-08 LAB — COMPREHENSIVE METABOLIC PANEL
ALBUMIN: 4.6 g/dL (ref 3.5–5.0)
ALT: 22 U/L (ref 17–63)
AST: 41 U/L (ref 15–41)
Alkaline Phosphatase: 96 U/L (ref 38–126)
Anion gap: 9 (ref 5–15)
BUN: 8 mg/dL (ref 6–20)
CO2: 29 mmol/L (ref 22–32)
Calcium: 9.7 mg/dL (ref 8.9–10.3)
Chloride: 86 mmol/L — ABNORMAL LOW (ref 101–111)
Creatinine, Ser: 0.76 mg/dL (ref 0.61–1.24)
GFR calc Af Amer: >60 mL/min (ref >60)
GFR calc non Af Amer: >60 mL/min (ref >60)
GLUCOSE: 164 mg/dL — AB (ref 65–99)
POTASSIUM: 3.3 mmol/L — AB (ref 3.5–5.1)
Sodium: 124 mmol/L — ABNORMAL LOW (ref 135–145)
Total Bilirubin: 0.8 mg/dL (ref 0.3–1.2)
Total Protein: 8.4 g/dL — ABNORMAL HIGH (ref 6.5–8.1)

## 2016-04-08 LAB — CBC
HEMATOCRIT: 37.5 % — AB (ref 39.0–52.0)
Hemoglobin: 13.7 g/dL (ref 13.0–17.0)
MCH: 33.7 pg (ref 26.0–34.0)
MCHC: 36.5 g/dL — ABNORMAL HIGH (ref 30.0–36.0)
MCV: 92.4 fL (ref 78.0–100.0)
Platelets: 275 10*3/uL (ref 150–400)
RBC: 4.06 MIL/uL — ABNORMAL LOW (ref 4.22–5.81)
RDW: 13.5 % (ref 11.5–15.5)
WBC: 4.5 10*3/uL (ref 4.0–10.5)

## 2016-04-08 LAB — URINE MICROSCOPIC-ADD ON

## 2016-04-08 LAB — LIPASE, BLOOD: LIPASE: 26 U/L (ref 11–51)

## 2016-04-08 MED ORDER — IOPAMIDOL (ISOVUE-300) INJECTION 61%
100.0000 mL | Freq: Once | INTRAVENOUS | Status: AC | PRN
  Administered 2016-04-08: 80 mL via INTRAVENOUS

## 2016-04-08 MED ORDER — ONDANSETRON HCL 4 MG/2ML IJ SOLN
4.0000 mg | Freq: Once | INTRAMUSCULAR | Status: AC
  Administered 2016-04-08: 4 mg via INTRAVENOUS
  Filled 2016-04-08: qty 2

## 2016-04-08 MED ORDER — HYDROMORPHONE HCL 2 MG/ML IJ SOLN
2.0000 mg | Freq: Once | INTRAMUSCULAR | Status: AC
  Administered 2016-04-08: 2 mg via INTRAVENOUS
  Filled 2016-04-08: qty 1

## 2016-04-08 MED ORDER — IOPAMIDOL (ISOVUE-300) INJECTION 61%
15.0000 mL | Freq: Once | INTRAVENOUS | Status: AC | PRN
  Administered 2016-04-08: 15 mL via ORAL

## 2016-04-08 NOTE — ED Provider Notes (Signed)
[facility] DEPT Provider Note   CSN: [id number] Arrival date & time: 04/08/16  0155 By signing my name below, I, Dyke Brackett, attest that this documentation has been prepared under the direction and in the presence of Orpah Greek, MD Electronically Signed: Dyke Brackett, Scribe. 04/08/2016. 3:06 AM.   History   Chief Complaint Chief Complaint  Patient presents with  . Abdominal Pain   HPI [name] is a 65 y.o. male with hx of HTN, ischemic bowel disease, and pancreatitis who presents to the Emergency Department complaining of worsening LUQ pain onset one week ago. He describes the pain as severe. He has taken pain medication with no relief. Pt also notes associated constipation and nausea. He was seen by Dr. [name] last Wednesday and was dx with pancreatitis. He is to have a CT and lab work done later today, but states he could not wait due to the pain. Pt had pancreatitis 18 years ago and states he has not had problems with it until last week. He endorses daily alcohol use, but states he has not had any alcohol today. Pt has no other complaints at this time.   The history is provided by the patient. No language interpreter was used.    Past Medical History:  Diagnosis Date  . COPD (chronic obstructive pulmonary disease) ([place])   . Hypertension   . 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[place])   . lung ca dx'd 12/2014  . Pancreatitis    x2 stents placed to make patent duct to pancreas  . Status post left foot surgery     Patient Active Problem List   Diagnosis Date Noted  . Hyponatremia 01/05/2015  . Hypokalemia 01/05/2015  . COPD (chronic obstructive pulmonary disease) ([place]) 01/05/2015  . Essential hypertension 01/05/2015  . Protein-calorie malnutrition, severe ([place]) 01/05/2015  . Pneumothorax of left lung after biopsy 01/04/2015  . Smoker 01/04/2015  . Pneumothorax, traumatic   . Cancer of upper lobe of left lung ([place]) 12/21/2014  . Pancreatic mass 11/23/2014    Past  Surgical History:  Procedure Laterality Date  . COLECTOMY    . COLOSTOMY    . COLOSTOMY REVERSAL    . EUS N/A 12/14/2014   Procedure: UPPER ENDOSCOPIC ULTRASOUND (EUS) LINEAR;  Surgeon: Milus Banister, MD;  Location: [facility];  Service: Endoscopy;  Laterality: N/A;  . FOOT SURGERY Left   . UPPER GI ENDOSCOPY    . VENTRAL HERNIA REPAIR      Home Medications    Prior to Admission medications   Medication Sig Start Date End Date Taking? Authorizing Provider  albuterol (PROVENTIL) (2.5 MG/3ML) 0.083% nebulizer solution Take 2.5 mg by nebulization every 6 (six) hours as needed for wheezing or shortness of breath.   Yes Historical Provider, MD  amLODipine (NORVASC) 10 MG tablet Take 10 mg by mouth every morning.  11/07/14  Yes Historical Provider, MD  buPROPion (WELLBUTRIN XL) 150 MG 24 hr tablet Take 150 mg by mouth every morning.  10/30/14  Yes Historical Provider, MD  HYDROcodone-acetaminophen (NORCO/VICODIN) 5-325 MG tablet Take 1 tablet by mouth every 6 (six) hours as needed for severe pain.  04/02/16  Yes Historical Provider, MD  ipratropium (ATROVENT) 0.02 % nebulizer solution Take 0.5 mg by nebulization every 6 (six) hours as needed for wheezing or shortness of breath.  02/27/15  Yes Historical Provider, MD  loratadine (CLARITIN) 10 MG tablet Take 10-20 mg by mouth daily as needed for allergies.    Yes Historical Provider, MD  metoprolol  succinate (TOPROL-XL) 25 MG 24 hr tablet Take 25 mg by mouth every morning.  11/07/14  Yes Historical Provider, MD  Multiple Vitamin (MULTIVITAMIN WITH MINERALS) TABS tablet Take 1 tablet by mouth every morning.   Yes Historical Provider, MD  Nutritional Supplements (CARNATION BREAKFAST ESSENTIALS PO) Take by mouth.   Yes Historical Provider, MD  omeprazole (PRILOSEC) 20 MG capsule Take 20 mg by mouth 2 (two) times daily before a meal.   Yes Historical Provider, MD  PROAIR HFA 108 (90 BASE) MCG/ACT inhaler Inhale 1 puff into the lungs every 4 (four) hours  as needed for wheezing or shortness of breath.  10/16/14  Yes Historical Provider, MD  oxyCODONE (OXY IR/ROXICODONE) 5 MG immediate release tablet Take 1 tablet (5 mg total) by mouth every 4 (four) hours as needed for moderate pain. Patient not taking: Reported on 04/08/2016 01/06/15   Venetia Maxon Rama, MD    Family History Family History  Problem Relation Age of Onset  . COPD Mother   . Diabetes Father   . Cancer Brother     metastatic, unknown primary    Social History Social History  Substance Use Topics  . Smoking status: Current Every Day Smoker    Packs/day: 0.25    Years: 50.00    Types: Cigarettes  . Smokeless tobacco: Never Used     Comment: currently 0.5 ppd, trying to quit  . Alcohol use 2.4 oz/week    4 Cans of beer per week     Comment: per day     Allergies   Chantix [varenicline] and Prevacid [lansoprazole]   Review of Systems Review of Systems 10 systems reviewed and all are negative for acute change except as noted in the HPI.  Physical Exam Updated Vital Signs BP 142/84 (BP Location: Left Arm)   Pulse 63   Temp 98 F (36.7 C) (Oral)   Resp 19   Ht '5\' 2"'$  (1.575 m)   Wt 87 lb (39.5 kg)   SpO2 98%   BMI 15.91 kg/m   Physical Exam  Constitutional: He is oriented to person, place, and time. He appears well-developed and well-nourished. No distress.  HENT:  Head: Normocephalic and atraumatic.  Right Ear: Hearing normal.  Left Ear: Hearing normal.  Nose: Nose normal.  Mouth/Throat: Oropharynx is clear and moist and mucous membranes are normal.  Eyes: Conjunctivae and EOM are normal. Pupils are equal, round, and reactive to light.  Neck: Normal range of motion. Neck supple.  Cardiovascular: Regular rhythm, S1 normal and S2 normal.  Exam reveals no gallop and no friction rub.   No murmur heard. Pulmonary/Chest: Effort normal and breath sounds normal. No respiratory distress. He exhibits no tenderness.  Abdominal: Soft. Normal appearance and bowel  sounds are normal. There is no hepatosplenomegaly. There is tenderness (LUQ). There is no rebound, no guarding, no tenderness at McBurney's point and negative Murphy's sign. No hernia.  Musculoskeletal: Normal range of motion.  Neurological: He is alert and oriented to person, place, and time. He has normal strength. No cranial nerve deficit or sensory deficit. Coordination normal. GCS eye subscore is 4. GCS verbal subscore is 5. GCS motor subscore is 6.  Skin: Skin is warm, dry and intact. No rash noted. No cyanosis.  Psychiatric: He has a normal mood and affect. His speech is normal and behavior is normal. Thought content normal.  Nursing note and vitals reviewed.   ED Treatments / Results  DIAGNOSTIC STUDIES:  Oxygen Saturation is 95% on RA,  adequate by my interpretation.    COORDINATION OF CARE:  3:07 AM Discussed treatment plan which includes dilaudid and Zofran with pt at bedside and pt agreed to plan.  Labs (all labs ordered are listed, but only abnormal results are displayed) Labs Reviewed  COMPREHENSIVE METABOLIC PANEL - Abnormal; Notable for the following:       Result Value   Sodium 124 (*)    Potassium 3.3 (*)    Chloride 86 (*)    Glucose, Bld 164 (*)    Total Protein 8.4 (*)    All other components within normal limits  CBC - Abnormal; Notable for the following:    RBC 4.06 (*)    HCT 37.5 (*)    MCHC 36.5 (*)    All other components within normal limits  URINALYSIS, ROUTINE W REFLEX MICROSCOPIC (NOT AT [facility]) - Abnormal; Notable for the following:    Glucose, UA 100 (*)    Protein, ur >300 (*)    All other components within normal limits  URINE MICROSCOPIC-ADD ON - Abnormal; Notable for the following:    Squamous Epithelial / LPF 0-5 (*)    Bacteria, UA RARE (*)    Casts HYALINE CASTS (*)    All other components within normal limits  LIPASE, BLOOD    EKG  EKG Interpretation None       Radiology Ct Abdomen Pelvis W Contrast  Result Date:  04/08/2016 CLINICAL DATA:  Worsening left upper quadrant abdominal pain for 1 week EXAM: CT ABDOMEN AND PELVIS WITH CONTRAST TECHNIQUE: Multidetector CT imaging of the abdomen and pelvis was performed using the standard protocol following bolus administration of intravenous contrast. CONTRAST:  27m ISOVUE-300 IOPAMIDOL (ISOVUE-300) INJECTION 61% COMPARISON:  PET-CT 11/17/2014 FINDINGS: Lower chest: There is minimal atelectatic appearing patchy opacity in right lung base. There are calcified granulomata of the lower lobes bilaterally. Hepatobiliary: No focal liver abnormality is seen. No gallstones, gallbladder wall thickening, or biliary dilatation. Pancreas: Extensive pancreatic parenchymal calcifications and mild generalized duct dilatation, consistent with chronic pancreatitis. No evidence of acute pancreatitis. This is unchanged. Spleen: Normal in size without focal abnormality. Adrenals/Urinary Tract: Adrenal glands are unremarkable. Kidneys are normal, without renal calculi, focal lesion, or hydronephrosis. Bladder is distended. A portion of the distended bladder extends into the right inguinal hernia. Stomach/Bowel: Stomach is within normal limits. Appendix appears normal. No evidence of bowel wall thickening, distention, or inflammatory changes. Vascular/Lymphatic: Aortic atherosclerosis. No enlarged abdominal or pelvic lymph nodes. Reproductive: No adnexal lesions. Other: Bilateral inguinal hernias, containing urinary bladder on the right and fat on the left. Musculoskeletal: No significant skeletal lesions. IMPRESSION: 1. Chronic pancreatitis.  No evidence of acute pancreatitis. 2. Moderate distention of the urinary bladder. There is extension of a small portion of the distended bladder into the right inguinal hernia. 3. Bilateral inguinal hernias, containing a urinary bladder on the right and fat on the left. Electronically Signed   By: DAndreas NewportM.D.   On: 04/08/2016 06:20     Procedures Procedures (including critical care time)  Medications Ordered in ED Medications  HYDROmorphone (DILAUDID) injection 2 mg (2 mg Intravenous Given 04/08/16 0332)  ondansetron (ZOFRAN) injection 4 mg (4 mg Intravenous Given 04/08/16 0333)  iopamidol (ISOVUE-300) 61 % injection 15 mL (15 mLs Oral Contrast Given 04/08/16 0413)  iopamidol (ISOVUE-300) 61 % injection 100 mL (80 mLs Intravenous Contrast Given 04/08/16 0513)     Initial Impression / Assessment and Plan / ED Course  I have reviewed the  triage vital signs and the nursing notes.  Pertinent labs & imaging results that were available during my care of the patient were reviewed by me and considered in my medical decision making (see chart for details).  Clinical Course  Patient presents to the emergency part for evaluation of abdominal pain. Patient reports he saw his primary care doctor and was told he had pancreatitis. He has a history of recurrent pancreatitis, but has not had any problems for years. Patient was scheduled to have a CAT scan later today, but pain became too severe overnight so he presented for pain control. Patient's lab work, however, does not reflect pancreatitis. Lipase was normal. He had mild hyponatremia, otherwise no significant abnormality. Patient did have a CT scan performed. No evidence of acute pancreatitis or other abnormality noted. He does have calcifications in the pancreas consistent with chronic pancreatitis. Patient does not require hospitalization, to follow-up with his primary care physician, Dr. [name], as an outpatient.  Final Clinical Impressions(s) / ED Diagnoses   Final diagnoses:  Left upper quadrant pain  Hyponatremia  Chronic pancreatitis, unspecified pancreatitis type [facility])    New Prescriptions New Prescriptions   No medications on file     Orpah Greek, MD 04/08/16 [phone]

## 2016-04-08 NOTE — ED Triage Notes (Signed)
Patient is experiencing left upper quad pain that started last week. Pt reports he saw Dr. [name] on last Wednesday. Dx with pancreatitis. Scheduled CT abd later today. Pt reports the pain is unbearable and constipation.

## 2016-06-10 DIAGNOSIS — R4182 Altered mental status, unspecified: Secondary | ICD-10-CM | POA: Diagnosis not present

## 2016-06-10 DIAGNOSIS — Z0000 Encounter for general adult medical examination without abnormal findings: Secondary | ICD-10-CM | POA: Diagnosis not present

## 2016-06-13 ENCOUNTER — Other Ambulatory Visit: Payer: Self-pay | Admitting: Internal Medicine

## 2016-06-13 DIAGNOSIS — C3490 Malignant neoplasm of unspecified part of unspecified bronchus or lung: Secondary | ICD-10-CM

## 2016-06-23 ENCOUNTER — Ambulatory Visit
Admission: RE | Admit: 2016-06-23 | Discharge: 2016-06-23 | Disposition: A | Discharge status: Home / Self Care | Payer: Medicare HMO | Source: Ambulatory Visit | Attending: Internal Medicine | Admitting: Internal Medicine

## 2016-06-23 DIAGNOSIS — C3490 Malignant neoplasm of unspecified part of unspecified bronchus or lung: Secondary | ICD-10-CM

## 2016-06-23 DIAGNOSIS — R413 Other amnesia: Secondary | ICD-10-CM | POA: Diagnosis not present

## 2016-06-23 MED ORDER — GADOBENATE DIMEGLUMINE 529 MG/ML IV SOLN
8.0000 mL | Freq: Once | INTRAVENOUS | Status: AC | PRN
  Administered 2016-06-23: 8 mL via INTRAVENOUS

## 2016-07-07 DIAGNOSIS — G939 Disorder of brain, unspecified: Secondary | ICD-10-CM | POA: Diagnosis not present

## 2016-07-24 ENCOUNTER — Other Ambulatory Visit: Payer: Self-pay | Admitting: Internal Medicine

## 2016-07-24 DIAGNOSIS — G939 Disorder of brain, unspecified: Secondary | ICD-10-CM

## 2016-08-02 ENCOUNTER — Ambulatory Visit
Admission: RE | Admit: 2016-08-02 | Discharge: 2016-08-02 | Disposition: A | Discharge status: Home / Self Care | Payer: Medicare HMO | Source: Ambulatory Visit | Attending: Internal Medicine | Admitting: Internal Medicine

## 2016-08-02 DIAGNOSIS — G939 Disorder of brain, unspecified: Secondary | ICD-10-CM | POA: Diagnosis not present

## 2016-08-02 DIAGNOSIS — R69 Illness, unspecified: Secondary | ICD-10-CM | POA: Diagnosis not present

## 2016-08-02 MED ORDER — GADOBENATE DIMEGLUMINE 529 MG/ML IV SOLN
8.0000 mL | Freq: Once | INTRAVENOUS | Status: AC | PRN
  Administered 2016-08-02: 8 mL via INTRAVENOUS

## 2016-08-11 DIAGNOSIS — R634 Abnormal weight loss: Secondary | ICD-10-CM | POA: Diagnosis not present

## 2016-08-11 DIAGNOSIS — G939 Disorder of brain, unspecified: Secondary | ICD-10-CM | POA: Diagnosis not present

## 2016-09-01 ENCOUNTER — Ambulatory Visit (INDEPENDENT_AMBULATORY_CARE_PROVIDER_SITE_OTHER): Payer: Medicare HMO | Admitting: Neurology

## 2016-09-01 ENCOUNTER — Encounter: Payer: Self-pay | Admitting: Neurology

## 2016-09-01 VITALS — BP 124/70 | HR 76 | Ht 62.0 in | Wt 92.8 lb

## 2016-09-01 DIAGNOSIS — F1020 Alcohol dependence, uncomplicated: Secondary | ICD-10-CM | POA: Diagnosis not present

## 2016-09-01 DIAGNOSIS — R930 Abnormal findings on diagnostic imaging of skull and head, not elsewhere classified: Secondary | ICD-10-CM | POA: Diagnosis not present

## 2016-09-01 DIAGNOSIS — E569 Vitamin deficiency, unspecified: Secondary | ICD-10-CM | POA: Diagnosis not present

## 2016-09-01 DIAGNOSIS — G371 Central demyelination of corpus callosum: Secondary | ICD-10-CM | POA: Diagnosis not present

## 2016-09-01 DIAGNOSIS — R9082 White matter disease, unspecified: Secondary | ICD-10-CM

## 2016-09-01 NOTE — Patient Instructions (Addendum)
Remember to drink plenty of fluid, eat healthy meals and do not skip any meals. Try to eat protein with a every meal and eat a healthy snack such as fruit or nuts in between meals. Try to keep a regular sleep-wake schedule and try to exercise daily, particularly in the form of walking, 20-30 minutes a day, if you can.   As far as diagnostic testing: repeat MRI in 3 months  I would like to see you back in 6 months, sooner if we need to. Please call us with any interim questions, concerns, problems, updates or refill requests.   Our phone number is [phone]. We also have an after hours call service for urgent matters and there is a physician on-call for urgent questions. For any emergencies you know to call 911 or go to the nearest emergency room   Marchiafava-Bignami disease

## 2016-09-01 NOTE — Progress Notes (Signed)
[facility]    Provider:  Dr Jaynee Eagles Referring Provider: [name], MD Primary Care Physician:  [name], MD  CC:  Brain lesion  HPI:  [name] is a 66 y.o. male here as a referral from Dr. [name] for brain lesion. He is here with his partner who provides much information. In January he had a cough and started showing signs of confusion. MRI was ordered. He would forget conversations, lose things, no alteration of awareness. His legs just gave out, in the setting of illness (cough, maybe bronchitis). He has an extensive alcohol history. He drinks every day stating at 330pm. He drinks beer, a 6-pack every day. Patient denies any difficulty with anomia, dyspraxia, visual field problems or visual motor difficulties. MRI showed a lesion in the splenium, repeat MRI stable. No other focal neurologic deficits pe rpatient, associated symptoms, inciting events or modifiable factors. He is back to his baseline and he will continue to drink alcohol, he has been through rehabilitation already and not interested.  Reviewed notes, labs and imaging from outside physicians, which showed:  Personally reviewed MRI images and also reviewed them with patient and his partner:  IMPRESSION: 1. Stable MRI of the brain with no acute intracranial abnormality or abnormal enhancement. 2. 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 Given stable persistence seizure activity or acute infectious/inflammatory process is unlikely. Low grade glial neoplasm is not excluded and continued follow-up to ensure stability is recommended. 3. Mild chronic microvascular ischemic changes and mild parenchymal volume loss of the brain. 4. Stable paranasal sinus disease.  Review of Systems: Patient complains of symptoms per  HPI as well as the following symptoms: no CP, no SOB. Pertinent negatives per HPI. All others negative.   Social History   Social History  . Marital status: Significant Other    Spouse name: N/A  . Number of children: 3  . Years of education: 12   Occupational History  . Retired    Social History Main Topics  . Smoking status: Current Every Day Smoker    Packs/day: 0.50    Years: 50.00    Types: Cigarettes  . Smokeless tobacco: Never Used     Comment: currently 0.5 ppd, trying to quit  . Alcohol use Yes     Comment: 6 pack of beer per day  . Drug use: No  . Sexual activity: Yes    Birth control/ protection: Injection   Other Topics Concern  . Not on file   Social History Narrative   Lives at home w/ a friend   Left-handed   Caffeine: 2 cups of coffee per day    Family History  Problem Relation Age of Onset  . COPD Mother   . Diabetes Father   . Cancer Brother     metastatic, unknown primary    Past Medical History:  Diagnosis Date  . COPD (chronic obstructive pulmonary disease) ([place])   . Hypertension   . 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[place])   . Lung cancer dx'd 12/2014  . Pancreatitis    x2 stents placed to make patent duct to pancreas  . Status post left foot surgery     Past Surgical History:  Procedure Laterality Date  . COLECTOMY    . COLOSTOMY    . COLOSTOMY REVERSAL    . EUS N/A 12/14/2014   Procedure: UPPER ENDOSCOPIC ULTRASOUND (  EUS) LINEAR;  Surgeon: Milus Banister, MD;  Location: [facility];  Service: Endoscopy;  Laterality: N/A;  . FOOT SURGERY Left 2006  . PANCREAS SURGERY  2007  . UPPER GI ENDOSCOPY    . VENTRAL HERNIA REPAIR  1967    Current Outpatient Prescriptions  Medication Sig Dispense Refill  . albuterol (PROVENTIL) (2.5 MG/3ML) 0.083% nebulizer solution Take 2.5 mg by nebulization every 6 (six) hours as needed for wheezing or shortness of breath.    Marland Kitchen amLODipine (NORVASC) 10 MG tablet Take 10 mg by mouth every morning.     Marland Kitchen  buPROPion (WELLBUTRIN XL) 150 MG 24 hr tablet Take 150 mg by mouth every morning.     . Cyanocobalamin (VITAMIN B-12 PO) Take by mouth daily.    Marland Kitchen ipratropium (ATROVENT) 0.02 % nebulizer solution Take 0.5 mg by nebulization every 6 (six) hours as needed for wheezing or shortness of breath.     . loratadine (CLARITIN) 10 MG tablet Take 10 mg by mouth daily as needed for allergies.     . metoprolol succinate (TOPROL-XL) 25 MG 24 hr tablet Take 25 mg by mouth every morning.     . mirtazapine (REMERON) 15 MG tablet     . Multiple Vitamin (MULTIVITAMIN WITH MINERALS) TABS tablet Take 1 tablet by mouth every morning.    . Nutritional Supplements (CARNATION BREAKFAST ESSENTIALS PO) Take by mouth.    Marland Kitchen PROAIR HFA 108 (90 BASE) MCG/ACT inhaler Inhale 1 puff into the lungs every 4 (four) hours as needed for wheezing or shortness of breath.     . Thiamine HCl (THIAMINE PO) Take by mouth daily.     No current facility-administered medications for this visit.     Allergies as of 09/01/2016 - Review Complete 09/01/2016  Allergen Reaction Noted  . Chantix [varenicline] Other (See Comments) 12/26/2014  . Prevacid [lansoprazole] Other (See Comments) 11/30/2014    Vitals: BP 124/70   Pulse 76   Ht '5\' 2"'$  (1.575 m)   Wt 92 lb 12.8 oz (42.1 kg)   BMI 16.97 kg/m  Last Weight:  Wt Readings from Last 1 Encounters:  09/01/16 92 lb 12.8 oz (42.1 kg)   Last Height:   Ht Readings from Last 1 Encounters:  09/01/16 '5\' 2"'$  (1.575 m)    Physical exam: Exam: Gen: NAD, conversant, thin, Poorly groomed, smells heavily of smoke                     CV: RRR, no MRG. No Carotid Bruits. No peripheral edema, warm, nontender Eyes: Conjunctivae clear without exudates or hemorrhage  Neuro: Detailed Neurologic Exam  Speech:    Speech is normal; fluent and spontaneous with normal comprehension.  Cognition:    The patient is oriented to person, place, and time;     recent and remote memory intact;     language  fluent;     normal attention, concentration,     fund of knowledge Cranial Nerves:    The pupils are equal, round, and reactive to light. The fundi are normal and spontaneous venous pulsations are present. Visual fields are full to finger confrontation. Extraocular movements are intact. Trigeminal sensation is intact and the muscles of mastication are normal. The face is symmetric. The palate elevates in the midline. Hearing intact. Voice is normal. Shoulder shrug is normal. The tongue has normal motion without fasciculations.   Coordination:    No dysmetria  Gait:    Heel-toe and tandem gait are  intact with mild imbalance  Motor Observation:    Mild tremor more postural , Thin tapering legs, generalized atrophy Tone:    Dec muscle tone.    Posture:    Posture is normal. normal erect    Strength:    Strength is V/V in the upper and lower limbs.      Sensation: intact to LT     Reflex Exam:  DTR's:    Deep tendon reflexes in the upper and lower extremities are brisk bilaterally.   Toes:    Right upgoing Clonus:    Clonus is absent.  Assessment/Plan:  This is a 66 year old male looks much older than stated age, poorly groomed, smells heavily of smoke. MRI of the brain showed a left paramedian splenium lesion with a broad differential. 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 I did discuss this with patient and his partner and advised that this could worsen if he didn't get help for his alcohol intake, also advise stopping abruptly can cause withdrawal and death. He is not interested in reducing his alcohol intake.  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 Patient is aware that we will set up with a repeat MRI in 3 months but it is his  responsibility to follow up with me in 6 months to discuss and reorder MRI of the brain.  Cc: [name], MD   Sarina Ill, MD  [facility] [address] [place], [place] [address]  Phone [phone] Fax [phone]

## 2016-09-02 ENCOUNTER — Telehealth: Payer: Self-pay

## 2016-09-02 NOTE — Telephone Encounter (Signed)
-----   Message from Melvenia Beam, MD sent at 09/02/2016 12:23 PM EDT ----- Labs look fine thanks

## 2016-09-02 NOTE — Telephone Encounter (Signed)
Called pt w/ unremarkable lab results. May call back w/ additional questions/concerns.

## 2016-09-02 NOTE — Telephone Encounter (Signed)
Pt called back. Reviewed lab results. Verbalized understanding and appreciation for call.

## 2016-09-04 LAB — BASIC METABOLIC PANEL
BUN / CREAT RATIO: 21 (ref 10–24)
BUN: 17 mg/dL (ref 8–27)
CHLORIDE: 99 mmol/L (ref 96–106)
CO2: 23 mmol/L (ref 18–29)
CREATININE: 0.81 mg/dL (ref 0.76–1.27)
Calcium: 9.2 mg/dL (ref 8.6–10.2)
GFR calc non Af Amer: 93 mL/min/{1.73_m2} (ref >59)
GFR, EST AFRICAN AMERICAN: 108 mL/min/{1.73_m2} (ref >59)
Glucose: 105 mg/dL — ABNORMAL HIGH (ref 65–99)
Potassium: 4.7 mmol/L (ref 3.5–5.2)
SODIUM: 139 mmol/L (ref 134–144)

## 2016-09-04 LAB — B12 AND FOLATE PANEL: Vitamin B-12: 1871 pg/mL — ABNORMAL HIGH (ref 232–1245)

## 2016-09-04 LAB — METHYLMALONIC ACID, SERUM: Methylmalonic Acid: 223 nmol/L (ref 0–378)

## 2016-09-04 LAB — VITAMIN B1: Thiamine: 158.4 nmol/L (ref 66.5–200.0)

## 2016-09-22 ENCOUNTER — Telehealth: Payer: Self-pay | Admitting: *Deleted

## 2016-09-22 NOTE — Telephone Encounter (Signed)
CALLED PATIENT TO INFORM OF CT FOR 10-06-16- ARRIVAL TIME- 12:15 PM @ [facility], PT. TO BE NPO 4 HRS. PRIOR TO TEST, LVM FOR A RETURN CALL

## 2016-10-06 ENCOUNTER — Ambulatory Visit
Admission: RE | Admit: 2016-10-06 | Discharge: 2016-10-06 | Disposition: A | Discharge status: Home / Self Care | Payer: Medicare HMO | Source: Ambulatory Visit | Attending: Radiation Oncology | Admitting: Radiation Oncology

## 2016-10-06 ENCOUNTER — Ambulatory Visit (HOSPITAL_COMMUNITY)
Admission: RE | Admit: 2016-10-06 | Discharge: 2016-10-06 | Disposition: A | Discharge status: Home / Self Care | Payer: Medicare HMO | Source: Ambulatory Visit | Attending: Radiation Oncology | Admitting: Radiation Oncology

## 2016-10-06 ENCOUNTER — Encounter (HOSPITAL_COMMUNITY): Payer: Self-pay

## 2016-10-06 DIAGNOSIS — J432 Centrilobular emphysema: Secondary | ICD-10-CM | POA: Insufficient documentation

## 2016-10-06 DIAGNOSIS — I700 Atherosclerosis of aorta: Secondary | ICD-10-CM | POA: Diagnosis not present

## 2016-10-06 DIAGNOSIS — C3490 Malignant neoplasm of unspecified part of unspecified bronchus or lung: Secondary | ICD-10-CM | POA: Diagnosis not present

## 2016-10-06 DIAGNOSIS — C3412 Malignant neoplasm of upper lobe, left bronchus or lung: Secondary | ICD-10-CM

## 2016-10-06 DIAGNOSIS — R109 Unspecified abdominal pain: Secondary | ICD-10-CM

## 2016-10-06 DIAGNOSIS — R911 Solitary pulmonary nodule: Secondary | ICD-10-CM | POA: Diagnosis not present

## 2016-10-06 LAB — BASIC METABOLIC PANEL
ANION GAP: 9 meq/L (ref 3–11)
BUN: 12 mg/dL (ref 7.0–26.0)
CALCIUM: 10.1 mg/dL (ref 8.4–10.4)
CO2: 26 mEq/L (ref 22–29)
CREATININE: 0.8 mg/dL (ref 0.7–1.3)
Chloride: 98 mEq/L (ref 98–109)
EGFR: >90 mL/min/{1.73_m2} (ref >90)
Glucose: 99 mg/dl (ref 70–140)
Potassium: 4.7 mEq/L (ref 3.5–5.1)
Sodium: 133 mEq/L — ABNORMAL LOW (ref 136–145)

## 2016-10-06 MED ORDER — IOPAMIDOL (ISOVUE-300) INJECTION 61%
INTRAVENOUS | Status: AC
  Filled 2016-10-06: qty 75

## 2016-10-06 MED ORDER — IOPAMIDOL (ISOVUE-300) INJECTION 61%
75.0000 mL | Freq: Once | INTRAVENOUS | Status: AC | PRN
  Administered 2016-10-06: 60 mL via INTRAVENOUS

## 2016-10-09 ENCOUNTER — Encounter: Payer: Self-pay | Admitting: *Deleted

## 2016-10-09 ENCOUNTER — Ambulatory Visit
Admission: RE | Admit: 2016-10-09 | Discharge: 2016-10-09 | Disposition: A | Discharge status: Home / Self Care | Payer: Medicare HMO | Source: Ambulatory Visit | Attending: Radiation Oncology | Admitting: Radiation Oncology

## 2016-10-09 NOTE — Progress Notes (Signed)
[place] about his followup appointment for today with Shona Simpson, P.A.  no answer left a message to call to [phone]. [phone] Checked with scheduling Mr. Nova called to cancel his appointment for today and did not want to reschedule and asked for Shona Simpson, P.A. to call him at home, Bryson Ha is aware of the need to call him.

## 2016-10-14 ENCOUNTER — Telehealth: Payer: Self-pay | Admitting: Radiation Oncology

## 2016-10-14 DIAGNOSIS — C3412 Malignant neoplasm of upper lobe, left bronchus or lung: Secondary | ICD-10-CM

## 2016-10-14 NOTE — Telephone Encounter (Signed)
-----   Message from Kerri Perches sent at 10/10/2016 12:22 PM EDT ----- Regarding: PHONE [place],   Mr. [facility] returned your call.  Mr. Hennes phone number is [phone].  [name]

## 2016-10-14 NOTE — Telephone Encounter (Signed)
LM for pt regarding plan for repeat CT in 6 months.

## 2016-12-01 DIAGNOSIS — H52209 Unspecified astigmatism, unspecified eye: Secondary | ICD-10-CM | POA: Diagnosis not present

## 2016-12-01 DIAGNOSIS — H524 Presbyopia: Secondary | ICD-10-CM | POA: Diagnosis not present

## 2016-12-01 DIAGNOSIS — H5203 Hypermetropia, bilateral: Secondary | ICD-10-CM | POA: Diagnosis not present

## 2016-12-15 DIAGNOSIS — I10 Essential (primary) hypertension: Secondary | ICD-10-CM | POA: Diagnosis not present

## 2016-12-15 DIAGNOSIS — J449 Chronic obstructive pulmonary disease, unspecified: Secondary | ICD-10-CM | POA: Diagnosis not present

## 2016-12-15 DIAGNOSIS — G939 Disorder of brain, unspecified: Secondary | ICD-10-CM | POA: Diagnosis not present

## 2016-12-15 DIAGNOSIS — E871 Hypo-osmolality and hyponatremia: Secondary | ICD-10-CM | POA: Diagnosis not present

## 2017-03-09 ENCOUNTER — Ambulatory Visit: Payer: Medicare HMO | Admitting: Neurology

## 2017-03-10 ENCOUNTER — Encounter: Payer: Self-pay | Admitting: Neurology

## 2017-03-30 ENCOUNTER — Encounter (HOSPITAL_COMMUNITY): Payer: Self-pay

## 2017-03-30 ENCOUNTER — Ambulatory Visit (HOSPITAL_COMMUNITY)
Admission: RE | Admit: 2017-03-30 | Discharge: 2017-03-30 | Disposition: A | Discharge status: Home / Self Care | Payer: Medicare Other | Source: Ambulatory Visit | Attending: Radiation Oncology | Admitting: Radiation Oncology

## 2017-03-30 DIAGNOSIS — C3412 Malignant neoplasm of upper lobe, left bronchus or lung: Secondary | ICD-10-CM | POA: Diagnosis not present

## 2017-03-30 DIAGNOSIS — J439 Emphysema, unspecified: Secondary | ICD-10-CM | POA: Insufficient documentation

## 2017-03-30 DIAGNOSIS — C3490 Malignant neoplasm of unspecified part of unspecified bronchus or lung: Secondary | ICD-10-CM | POA: Diagnosis not present

## 2017-03-30 DIAGNOSIS — R911 Solitary pulmonary nodule: Secondary | ICD-10-CM | POA: Diagnosis not present

## 2017-03-30 DIAGNOSIS — J984 Other disorders of lung: Secondary | ICD-10-CM | POA: Insufficient documentation

## 2017-03-30 DIAGNOSIS — I700 Atherosclerosis of aorta: Secondary | ICD-10-CM | POA: Insufficient documentation

## 2017-03-30 LAB — POCT I-STAT CREATININE: Creatinine, Ser: 0.9 mg/dL (ref 0.61–1.24)

## 2017-03-30 MED ORDER — IOPAMIDOL (ISOVUE-300) INJECTION 61%
INTRAVENOUS | Status: AC
  Filled 2017-03-30: qty 75

## 2017-03-30 MED ORDER — IOPAMIDOL (ISOVUE-300) INJECTION 61%
75.0000 mL | Freq: Once | INTRAVENOUS | Status: AC | PRN
  Administered 2017-03-30: 75 mL via INTRAVENOUS

## 2017-04-02 ENCOUNTER — Telehealth: Payer: Self-pay | Admitting: Radiation Oncology

## 2017-04-02 DIAGNOSIS — C3412 Malignant neoplasm of upper lobe, left bronchus or lung: Secondary | ICD-10-CM

## 2017-04-02 NOTE — Telephone Encounter (Signed)
I called the patient reviewed his CT scan findings. We discussed the last interval and revealed an area in the right upper lobe at 5 mm and is currently measured 7 x 6.5 mm. This is still too low for PET detection, we discussed the options of following this closely with a repeat scan in 3 months versus waiting 6 months. The patient would like to wait 6 months, and was counseled on the option altogether, he is aware that if this grew that it could be that other therapy may be necessary. We will plan to the seed and he will notify us otherwise if he changes his mind.

## 2017-04-06 DIAGNOSIS — R972 Elevated prostate specific antigen [PSA]: Secondary | ICD-10-CM | POA: Diagnosis not present

## 2017-04-06 DIAGNOSIS — I10 Essential (primary) hypertension: Secondary | ICD-10-CM | POA: Diagnosis not present

## 2017-04-13 DIAGNOSIS — Z23 Encounter for immunization: Secondary | ICD-10-CM | POA: Diagnosis not present

## 2017-04-13 DIAGNOSIS — Z0000 Encounter for general adult medical examination without abnormal findings: Secondary | ICD-10-CM | POA: Diagnosis not present

## 2017-04-13 DIAGNOSIS — K7689 Other specified diseases of liver: Secondary | ICD-10-CM | POA: Diagnosis not present

## 2017-04-13 DIAGNOSIS — E039 Hypothyroidism, unspecified: Secondary | ICD-10-CM | POA: Diagnosis not present

## 2017-05-11 ENCOUNTER — Encounter: Payer: Self-pay | Admitting: Neurology

## 2017-05-11 ENCOUNTER — Ambulatory Visit (INDEPENDENT_AMBULATORY_CARE_PROVIDER_SITE_OTHER): Payer: Medicare Other | Admitting: Neurology

## 2017-05-11 VITALS — BP 115/69 | HR 62 | Ht 62.0 in | Wt 95.6 lb

## 2017-05-11 DIAGNOSIS — G371 Central demyelination of corpus callosum: Secondary | ICD-10-CM

## 2017-05-11 DIAGNOSIS — G939 Disorder of brain, unspecified: Secondary | ICD-10-CM

## 2017-05-11 NOTE — Patient Instructions (Signed)
MRI brain w/wo contrast

## 2017-05-11 NOTE — Progress Notes (Signed)
[facility]    Provider:  Dr Jaynee Eagles Referring Provider: [name], MD Primary Care Physician:  [name], MD  CC:  Brain lesion  Interval history 05/11/2017: This is a 66 year old male here for follow-up of a corpus callosum lesion, past medical history of alcoholism, pancreatitis, lung cancer, ischemic bowel disease, hypertension, COPD. Marchiafava-Bignami disease a possibility. He continues to drink excessive amounts of alcohol daily.  Discussed the role of alcohol plays in lesions of the brain, dementia, cerebellar atrophy, strokes and recommended following with primary care for an alcohol cessation program.  Did discuss that he should not stop acutely or abruptly as this could cause delirium tremens and death. He still continues to smoke.   He denies  paranoia, psychosis, or dementia. Seizures are common, and hemiparesis, aphasia, abnormal movements, and ataxia, discussed Marchiafava-Bignami can progress to coma and death. Will repeat MRI brain w/wo contrast. He does have depression but not worsening. Wife says that he has been more suspicious of her. If she has been gone too long he gets upset. He says he has a past history of women cheating on him.  HPI:  [name] is a 66 y.o. male here as a referral from Dr. [name] for brain lesion. He is here with his partner who provides much information. In January he had a cough and started showing signs of confusion. MRI was ordered. He would forget conversations, lose things, no alteration of awareness. His legs just gave out, in the setting of illness (cough, maybe bronchitis). He has an extensive alcohol history. He drinks every day stating at 330pm. He drinks beer, a 6-pack every day. Patient denies any difficulty with anomia, dyspraxia, visual field problems or visual motor difficulties. MRI showed a lesion in the splenium, repeat MRI stable. No other focal neurologic deficits pe rpatient, associated symptoms, inciting events  or modifiable factors. He is back to his baseline and he will continue to drink alcohol, he has been through rehabilitation already and not interested.  Reviewed notes, labs and imaging from outside physicians, which showed:  Personally reviewed MRI images and also reviewed them with patient and his partner:  IMPRESSION: 1. Stable MRI of the brain with no acute intracranial abnormality or abnormal enhancement. 2. 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 Given stable persistence seizure activity or acute infectious/inflammatory process is unlikely. Low grade glial neoplasm is not excluded and continued follow-up to ensure stability is recommended. 3. Mild chronic microvascular ischemic changes and mild parenchymal volume loss of the brain. 4. Stable paranasal sinus disease.  Review of Systems: Patient complains of symptoms per HPI as well as the following symptoms: alcoholism. Pertinent negatives and positives per HPI. All others negative.   Social History   Socioeconomic History  . Marital status: Significant Other    Spouse name: Not on file  . Number of children: 3  . Years of education: 71  . Highest education level: Not on file  Social Needs  . Financial resource strain: Not on file  . Food insecurity - worry: Not on file  . Food insecurity - inability: Not on file  . Transportation needs - medical: Not on file  . Transportation needs - non-medical: Not on file  Occupational History  . Occupation: Retired  Tobacco Use  . Smoking status: Current Every Day Smoker    Packs/day: 0.50    Years:  50.00    Pack years: 25.00    Types: Cigarettes  . Smokeless tobacco: Never Used  . Tobacco comment: currently 0.5 ppd, trying to quit  Substance and Sexual Activity  . Alcohol use: Yes     Comment: 6 pack of beer per day  . Drug use: No  . Sexual activity: Yes    Birth control/protection: Injection  Other Topics Concern  . Not on file  Social History Narrative   Lives at home w/ a friend   Left-handed   Caffeine: 2 cups of coffee per day    Family History  Problem Relation Age of Onset  . COPD Mother   . Diabetes Father   . Cancer Brother        metastatic, unknown primary  . Cancer Paternal Grandmother     Past Medical History:  Diagnosis Date  . COPD (chronic obstructive pulmonary disease) ([place])   . Hypertension   . 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[place])   . Lung cancer dx'd 12/2014  . Pancreatitis    x2 stents placed to make patent duct to pancreas  . Status post left foot surgery     Past Surgical History:  Procedure Laterality Date  . COLECTOMY    . COLOSTOMY    . COLOSTOMY REVERSAL    . EUS N/A 12/14/2014   Procedure: UPPER ENDOSCOPIC ULTRASOUND (EUS) LINEAR;  Surgeon: Milus Banister, MD;  Location: [facility];  Service: Endoscopy;  Laterality: N/A;  . FOOT SURGERY Left 2006  . PANCREAS SURGERY  2007  . UPPER GI ENDOSCOPY    . VENTRAL HERNIA REPAIR  1967    Current Outpatient Medications  Medication Sig Dispense Refill  . albuterol (PROVENTIL) (2.5 MG/3ML) 0.083% nebulizer solution Take 2.5 mg by nebulization every 6 (six) hours as needed for wheezing or shortness of breath.    Marland Kitchen amLODipine (NORVASC) 10 MG tablet Take 10 mg by mouth every morning.     Marland Kitchen buPROPion (WELLBUTRIN XL) 150 MG 24 hr tablet Take 150 mg by mouth every morning.     . Cyanocobalamin (VITAMIN B-12 PO) Take by mouth daily.    Marland Kitchen ipratropium (ATROVENT) 0.02 % nebulizer solution Take 0.5 mg by nebulization every 6 (six) hours as needed for wheezing or shortness of breath.     . loratadine (CLARITIN) 10 MG tablet Take 10 mg by mouth daily as needed for allergies.     . metoprolol succinate (TOPROL-XL) 25 MG 24 hr tablet Take 25 mg by mouth every morning.     . mirtazapine (REMERON) 15  MG tablet     . Multiple Vitamin (MULTIVITAMIN WITH MINERALS) TABS tablet Take 1 tablet by mouth every morning.    . Nutritional Supplements (CARNATION BREAKFAST ESSENTIALS PO) Take by mouth.    Marland Kitchen PROAIR HFA 108 (90 BASE) MCG/ACT inhaler Inhale 1 puff into the lungs every 4 (four) hours as needed for wheezing or shortness of breath.     . Thiamine HCl (THIAMINE PO) Take by mouth daily.     No current facility-administered medications for this visit.     Allergies as of 05/11/2017 - Review Complete 05/11/2017  Allergen Reaction Noted  . Chantix [varenicline] Other (See Comments) 12/26/2014  . Prevacid [lansoprazole] Other (See Comments) 11/30/2014    Vitals: BP 115/69 (BP Location: Right Arm, Patient Position: Sitting)   Pulse 62   Ht 5\' 2"  (1.575 m)   Wt 95 lb 9.6 oz (43.4 kg)   BMI 17.49 kg/m  Last Weight:  Wt Readings from Last 1 Encounters:  05/11/17 95 lb 9.6 oz (43.4 kg)   Last Height:   Ht Readings from Last 1 Encounters:  05/11/17 5\' 2"  (1.575 m)     Physical exam: Exam: Gen: NAD, conversant, thin, Poorly groomed, smells heavily of smoke                     CV: RRR, no MRG. No Carotid Bruits. No peripheral edema, warm, nontender Eyes: Conjunctivae clear without exudates or hemorrhage  Neuro: Detailed Neurologic Exam  Speech:    Speech is normal; fluent and spontaneous with normal comprehension.  Cognition:    The patient is oriented to person, place, and time;     recent and remote memory intact;     language fluent;     normal attention, concentration,     fund of knowledge Cranial Nerves:    The pupils are equal, round, and reactive to light. The fundi are normal and spontaneous venous pulsations are present. Visual fields are full to finger confrontation. Extraocular movements are intact. Trigeminal sensation is intact and the muscles of mastication are normal. The face is symmetric. The palate elevates in the midline. Hearing intact. Voice is normal.  Shoulder shrug is normal. The tongue has normal motion without fasciculations.   Coordination:    No dysmetria  Gait:    Heel-toe and tandem gait are intact with mild imbalance  Motor Observation:    Mild tremor more postural , Thin tapering legs, generalized atrophy Tone:    Dec muscle tone.    Posture:    Posture is normal. normal erect    Strength:    Strength is V/V in the upper and lower limbs.      Sensation: intact to LT     Reflex Exam:  DTR's:    Deep tendon reflexes in the upper and lower extremities are brisk bilaterally.   Toes:    Right upgoing Clonus:    Clonus is absent.  Assessment/Plan:  This is a 66 year old male looks much older than stated age, poorly groomed, smells heavily of smoke. MRI of the brain showed a left paramedian splenium lesion with a broad differential cannot exclude low-grade glioma. 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 I did discuss this with patient and his partner and advised that this could worsen if he didn't get help for his alcohol intake, also advise stopping abruptly can cause withdrawal and death. He is not interested in reducing his alcohol intake.  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 Patient prefers one year, I encourage a shorter period.   Patient is aware that we will set up with a repeat MRI  but it is his responsibility to follow up with me in 6 months (he prefers a year) to discuss and reorder MRI of the brain. Provided information from the [facility] about Marchiafava-Bignami, discussed in detail with patient and partner.  Orders Placed This Encounter  Procedures  . MR BRAIN W WO CONTRAST  . Basic Metabolic Panel    Cc: [name],  MD   Sarina Ill, MD  [facility] [address] [place], [place] [address]  Phone [phone] Fax [phone]  A total of 25 minutes was spent  face-to-face with this patient. Over half this time was spent on counseling patient on the Marchiafava-Bignami diagnosis and different diagnostic and therapeutic options available.

## 2017-05-12 ENCOUNTER — Telehealth: Payer: Self-pay | Admitting: *Deleted

## 2017-05-12 LAB — BASIC METABOLIC PANEL
BUN / CREAT RATIO: 11 (ref 10–24)
BUN: 8 mg/dL (ref 8–27)
CO2: 24 mmol/L (ref 20–29)
CREATININE: 0.74 mg/dL — AB (ref 0.76–1.27)
Calcium: 9.1 mg/dL (ref 8.6–10.2)
Chloride: 100 mmol/L (ref 96–106)
GFR calc Af Amer: 112 mL/min/{1.73_m2} (ref >59)
GFR, EST NON AFRICAN AMERICAN: 97 mL/min/{1.73_m2} (ref >59)
GLUCOSE: 102 mg/dL — AB (ref 65–99)
POTASSIUM: 4.5 mmol/L (ref 3.5–5.2)
SODIUM: 137 mmol/L (ref 134–144)

## 2017-05-12 NOTE — Telephone Encounter (Addendum)
Called and LVM (ok per DPR) informing patient that his labs are unremarkable. Call does not require a call back but patient encouraged to call with any questions.    ----- Message from Melvenia Beam, MD sent at 05/12/2017  9:06 AM EST ----- Labs unremarkable

## 2017-05-23 ENCOUNTER — Ambulatory Visit
Admission: RE | Admit: 2017-05-23 | Discharge: 2017-05-23 | Disposition: A | Discharge status: Home / Self Care | Payer: Medicare Other | Source: Ambulatory Visit | Attending: Neurology | Admitting: Neurology

## 2017-05-23 DIAGNOSIS — G939 Disorder of brain, unspecified: Secondary | ICD-10-CM | POA: Diagnosis not present

## 2017-05-23 DIAGNOSIS — G371 Central demyelination of corpus callosum: Secondary | ICD-10-CM | POA: Diagnosis not present

## 2017-05-23 MED ORDER — GADOBENATE DIMEGLUMINE 529 MG/ML IV SOLN
9.0000 mL | Freq: Once | INTRAVENOUS | Status: AC | PRN
  Administered 2017-05-23: 9 mL via INTRAVENOUS

## 2017-05-25 ENCOUNTER — Telehealth: Payer: Self-pay | Admitting: *Deleted

## 2017-05-25 NOTE — Telephone Encounter (Addendum)
Called and discussed MRI results. He verbalized understanding and decided to go ahead and schedule an appt for Monday May 10, 2018 @ 2:00 pm with an arrival time of 1:30 pm.   ----- Message from Melvenia Beam, MD sent at 05/24/2017  1:30 PM EST ----- MRI stable, no changes. Recommend repeating MRI in one year, patient should follow up in one year with me in the office so we can order a repeat MRI, it is patient's responsibility to schedule and follow up with me  thanks

## 2017-05-27 ENCOUNTER — Telehealth: Payer: Self-pay | Admitting: *Deleted

## 2017-05-27 NOTE — Telephone Encounter (Signed)
It was brought to my attention that patient has two appts scheduled next December for 1 year f/u. I called and LVM informing patient and asked for a return call to tell us which one he wants to keep and we will cancel the other.   Appts:   Monday, May 10, 2018 @ 2:00 pm  Monday, May 17, 2018 @ 2:00 pm

## 2017-05-28 NOTE — Telephone Encounter (Signed)
Tried to call patient once more to confirm which appt he would like.

## 2017-08-10 DIAGNOSIS — R911 Solitary pulmonary nodule: Secondary | ICD-10-CM | POA: Diagnosis not present

## 2017-08-10 DIAGNOSIS — I10 Essential (primary) hypertension: Secondary | ICD-10-CM | POA: Diagnosis not present

## 2017-08-10 DIAGNOSIS — E039 Hypothyroidism, unspecified: Secondary | ICD-10-CM | POA: Diagnosis not present

## 2017-08-10 DIAGNOSIS — G939 Disorder of brain, unspecified: Secondary | ICD-10-CM | POA: Diagnosis not present

## 2017-08-10 DIAGNOSIS — K7689 Other specified diseases of liver: Secondary | ICD-10-CM | POA: Diagnosis not present

## 2017-08-10 DIAGNOSIS — R109 Unspecified abdominal pain: Secondary | ICD-10-CM | POA: Diagnosis not present

## 2017-08-17 DIAGNOSIS — J449 Chronic obstructive pulmonary disease, unspecified: Secondary | ICD-10-CM | POA: Diagnosis not present

## 2017-08-17 DIAGNOSIS — R739 Hyperglycemia, unspecified: Secondary | ICD-10-CM | POA: Diagnosis not present

## 2017-08-17 DIAGNOSIS — I10 Essential (primary) hypertension: Secondary | ICD-10-CM | POA: Diagnosis not present

## 2017-08-17 DIAGNOSIS — K649 Unspecified hemorrhoids: Secondary | ICD-10-CM | POA: Diagnosis not present

## 2017-09-16 DIAGNOSIS — K648 Other hemorrhoids: Secondary | ICD-10-CM | POA: Diagnosis not present

## 2017-09-17 DIAGNOSIS — K648 Other hemorrhoids: Secondary | ICD-10-CM | POA: Insufficient documentation

## 2017-09-28 ENCOUNTER — Telehealth: Payer: Self-pay | Admitting: *Deleted

## 2017-09-28 NOTE — Telephone Encounter (Signed)
CALLED PATIENT TO INFORM OF STAT LABS FOR 10/05/17 @ 8 AM , LVM FOR A RETURN CALL

## 2017-09-30 ENCOUNTER — Ambulatory Visit: Payer: Self-pay | Admitting: Radiation Oncology

## 2017-09-30 DIAGNOSIS — K648 Other hemorrhoids: Secondary | ICD-10-CM | POA: Diagnosis not present

## 2017-10-05 ENCOUNTER — Ambulatory Visit
Admission: RE | Admit: 2017-10-05 | Discharge: 2017-10-05 | Disposition: A | Discharge status: Home / Self Care | Payer: Medicare Other | Source: Ambulatory Visit | Attending: Radiation Oncology | Admitting: Radiation Oncology

## 2017-10-05 ENCOUNTER — Ambulatory Visit (HOSPITAL_COMMUNITY)
Admission: RE | Admit: 2017-10-05 | Discharge: 2017-10-05 | Disposition: A | Discharge status: Home / Self Care | Payer: Medicare Other | Source: Ambulatory Visit | Attending: Radiation Oncology | Admitting: Radiation Oncology

## 2017-10-05 ENCOUNTER — Encounter (HOSPITAL_COMMUNITY): Payer: Self-pay

## 2017-10-05 DIAGNOSIS — I700 Atherosclerosis of aorta: Secondary | ICD-10-CM | POA: Diagnosis not present

## 2017-10-05 DIAGNOSIS — J439 Emphysema, unspecified: Secondary | ICD-10-CM | POA: Diagnosis not present

## 2017-10-05 DIAGNOSIS — C3412 Malignant neoplasm of upper lobe, left bronchus or lung: Secondary | ICD-10-CM | POA: Insufficient documentation

## 2017-10-05 DIAGNOSIS — I2510 Atherosclerotic heart disease of native coronary artery without angina pectoris: Secondary | ICD-10-CM | POA: Insufficient documentation

## 2017-10-05 LAB — BUN: BUN: 12 mg/dL (ref 7–26)

## 2017-10-05 LAB — CREATININE, SERUM
Creatinine, Ser: 0.83 mg/dL (ref 0.70–1.30)
GFR calc non Af Amer: >60 mL/min (ref >60)

## 2017-10-05 MED ORDER — IOHEXOL 300 MG/ML  SOLN
75.0000 mL | Freq: Once | INTRAMUSCULAR | Status: AC | PRN
  Administered 2017-10-05: 75 mL via INTRAVENOUS

## 2017-10-07 ENCOUNTER — Telehealth: Payer: Self-pay | Admitting: Radiation Oncology

## 2017-10-07 DIAGNOSIS — C3412 Malignant neoplasm of upper lobe, left bronchus or lung: Secondary | ICD-10-CM

## 2017-10-07 DIAGNOSIS — R911 Solitary pulmonary nodule: Secondary | ICD-10-CM

## 2017-10-07 NOTE — Telephone Encounter (Signed)
I spoke to the patient about the findings on CT imaging. He does have increasing RUL nodule in comparison from October and April of last year. He is not having progressive symptoms, but does have shortness of breath due to COPD and per report from pollen as well. He is in agreement to proceed with PET scan and I will follow up with these results when available.     Carola Rhine, PAC

## 2017-10-08 ENCOUNTER — Ambulatory Visit
Admission: RE | Admit: 2017-10-08 | Discharge: 2017-10-08 | Disposition: A | Discharge status: Home / Self Care | Payer: Medicare Other | Source: Ambulatory Visit | Attending: Radiation Oncology | Admitting: Radiation Oncology

## 2017-10-13 ENCOUNTER — Telehealth: Payer: Self-pay | Admitting: *Deleted

## 2017-10-13 NOTE — Telephone Encounter (Signed)
Called patient to inform of Pet Scan on 10-19-17 - arrival time -1:30 pm @ [facility], pt. to be npo- 6 hrs. Prior to test, spoke with patient and he is aware of this test

## 2017-10-16 ENCOUNTER — Telehealth: Payer: Self-pay

## 2017-10-16 NOTE — Telephone Encounter (Signed)
Called and verified appointment. Per 5/10 phone message return

## 2017-10-19 ENCOUNTER — Ambulatory Visit (HOSPITAL_COMMUNITY)
Admission: RE | Admit: 2017-10-19 | Discharge: 2017-10-19 | Disposition: A | Discharge status: Home / Self Care | Payer: Medicare Other | Source: Ambulatory Visit | Attending: Radiation Oncology | Admitting: Radiation Oncology

## 2017-10-19 DIAGNOSIS — J432 Centrilobular emphysema: Secondary | ICD-10-CM | POA: Diagnosis not present

## 2017-10-19 DIAGNOSIS — C3412 Malignant neoplasm of upper lobe, left bronchus or lung: Secondary | ICD-10-CM | POA: Insufficient documentation

## 2017-10-19 DIAGNOSIS — Z79899 Other long term (current) drug therapy: Secondary | ICD-10-CM | POA: Insufficient documentation

## 2017-10-19 DIAGNOSIS — I2510 Atherosclerotic heart disease of native coronary artery without angina pectoris: Secondary | ICD-10-CM | POA: Insufficient documentation

## 2017-10-19 DIAGNOSIS — K861 Other chronic pancreatitis: Secondary | ICD-10-CM | POA: Diagnosis not present

## 2017-10-19 DIAGNOSIS — R911 Solitary pulmonary nodule: Secondary | ICD-10-CM | POA: Diagnosis not present

## 2017-10-19 DIAGNOSIS — I700 Atherosclerosis of aorta: Secondary | ICD-10-CM | POA: Diagnosis not present

## 2017-10-19 DIAGNOSIS — C3490 Malignant neoplasm of unspecified part of unspecified bronchus or lung: Secondary | ICD-10-CM | POA: Diagnosis not present

## 2017-10-19 LAB — GLUCOSE, CAPILLARY: Glucose-Capillary: 115 mg/dL — ABNORMAL HIGH (ref 65–99)

## 2017-10-19 MED ORDER — FLUDEOXYGLUCOSE F - 18 (FDG) INJECTION
7.0000 | Freq: Once | INTRAVENOUS | Status: AC | PRN
  Administered 2017-10-19: 7 via INTRAVENOUS

## 2017-10-20 ENCOUNTER — Telehealth: Payer: Self-pay | Admitting: Radiation Oncology

## 2017-10-20 NOTE — Telephone Encounter (Signed)
I spoke with the patient regarding his PET scan and that I would review with Dr. Tammi Klippel and let him know how Dr. Tammi Klippel recommends that we proceed.

## 2017-10-21 ENCOUNTER — Telehealth: Payer: Self-pay | Admitting: Radiation Oncology

## 2017-10-21 NOTE — Telephone Encounter (Signed)
I called the pt to let him know that Dr. Tammi Klippel would like his case reviewed in thoracic conference. I left him a message letting him know I'd call him tomorrow.

## 2017-10-23 ENCOUNTER — Telehealth: Payer: Self-pay | Admitting: Radiation Oncology

## 2017-10-23 NOTE — Telephone Encounter (Signed)
As requested by Shona Simpson, PA-C I phoned the patient to inquire if he would be willing to under go another biopsy. Patient stated, "Doctor knows best." Patient goes onto say that after his initial biopsy his lung collapsed but he was well taken care and if the doctor believes he needs a biopsy for proper treatment he is willing. Patient understands this RN will inform Shona Simpson, PA-C of these findings and she will be in touch with his in the coming week.

## 2017-10-26 ENCOUNTER — Telehealth: Payer: Self-pay | Admitting: Radiation Oncology

## 2017-10-26 DIAGNOSIS — C3412 Malignant neoplasm of upper lobe, left bronchus or lung: Secondary | ICD-10-CM

## 2017-10-26 DIAGNOSIS — R911 Solitary pulmonary nodule: Secondary | ICD-10-CM

## 2017-10-26 NOTE — Telephone Encounter (Signed)
LM for pt to let him know Dr. Tammi Klippel would not treat without biopsy confirmation and would recommend CT guided biopsy. I will place orders and asked the pt to call me back to discuss further.

## 2017-10-27 ENCOUNTER — Telehealth: Payer: Self-pay | Admitting: Radiation Oncology

## 2017-10-27 NOTE — Telephone Encounter (Signed)
I spoke with pt's wife Denita to clarify recommendations for biopsy. We will contact her to schedule.

## 2017-10-30 ENCOUNTER — Other Ambulatory Visit: Payer: Self-pay | Admitting: Urology

## 2017-10-30 DIAGNOSIS — R911 Solitary pulmonary nodule: Secondary | ICD-10-CM

## 2017-10-30 DIAGNOSIS — C3412 Malignant neoplasm of upper lobe, left bronchus or lung: Secondary | ICD-10-CM

## 2017-11-20 ENCOUNTER — Other Ambulatory Visit: Payer: Self-pay | Admitting: Student

## 2017-11-23 ENCOUNTER — Ambulatory Visit (HOSPITAL_COMMUNITY)
Admission: RE | Admit: 2017-11-23 | Discharge: 2017-11-23 | Disposition: A | Discharge status: Home / Self Care | Payer: Medicare Other | Source: Ambulatory Visit | Attending: Urology | Admitting: Urology

## 2017-11-23 ENCOUNTER — Ambulatory Visit (HOSPITAL_COMMUNITY)
Admission: RE | Admit: 2017-11-23 | Discharge: 2017-11-23 | Disposition: A | Discharge status: Home / Self Care | Payer: Medicare Other | Source: Ambulatory Visit | Attending: Interventional Radiology | Admitting: Interventional Radiology

## 2017-11-23 ENCOUNTER — Encounter (HOSPITAL_COMMUNITY): Payer: Self-pay

## 2017-11-23 DIAGNOSIS — I10 Essential (primary) hypertension: Secondary | ICD-10-CM | POA: Insufficient documentation

## 2017-11-23 DIAGNOSIS — K861 Other chronic pancreatitis: Secondary | ICD-10-CM | POA: Insufficient documentation

## 2017-11-23 DIAGNOSIS — J449 Chronic obstructive pulmonary disease, unspecified: Secondary | ICD-10-CM | POA: Diagnosis not present

## 2017-11-23 DIAGNOSIS — C3411 Malignant neoplasm of upper lobe, right bronchus or lung: Secondary | ICD-10-CM | POA: Diagnosis not present

## 2017-11-23 DIAGNOSIS — Z9889 Other specified postprocedural states: Secondary | ICD-10-CM

## 2017-11-23 DIAGNOSIS — Z9049 Acquired absence of other specified parts of digestive tract: Secondary | ICD-10-CM | POA: Insufficient documentation

## 2017-11-23 DIAGNOSIS — R911 Solitary pulmonary nodule: Secondary | ICD-10-CM | POA: Insufficient documentation

## 2017-11-23 DIAGNOSIS — Z923 Personal history of irradiation: Secondary | ICD-10-CM | POA: Insufficient documentation

## 2017-11-23 DIAGNOSIS — Z85118 Personal history of other malignant neoplasm of bronchus and lung: Secondary | ICD-10-CM | POA: Diagnosis present

## 2017-11-23 DIAGNOSIS — Z79899 Other long term (current) drug therapy: Secondary | ICD-10-CM | POA: Insufficient documentation

## 2017-11-23 DIAGNOSIS — I2510 Atherosclerotic heart disease of native coronary artery without angina pectoris: Secondary | ICD-10-CM | POA: Insufficient documentation

## 2017-11-23 DIAGNOSIS — I700 Atherosclerosis of aorta: Secondary | ICD-10-CM | POA: Insufficient documentation

## 2017-11-23 DIAGNOSIS — F17210 Nicotine dependence, cigarettes, uncomplicated: Secondary | ICD-10-CM | POA: Diagnosis not present

## 2017-11-23 DIAGNOSIS — J939 Pneumothorax, unspecified: Secondary | ICD-10-CM | POA: Diagnosis not present

## 2017-11-23 DIAGNOSIS — C3412 Malignant neoplasm of upper lobe, left bronchus or lung: Secondary | ICD-10-CM

## 2017-11-23 LAB — PROTIME-INR
INR: 0.99
Prothrombin Time: 13 seconds (ref 11.4–15.2)

## 2017-11-23 LAB — CBC
HEMATOCRIT: 39.4 % (ref 39.0–52.0)
Hemoglobin: 13.6 g/dL (ref 13.0–17.0)
MCH: 31.6 pg (ref 26.0–34.0)
MCHC: 34.5 g/dL (ref 30.0–36.0)
MCV: 91.4 fL (ref 78.0–100.0)
Platelets: 168 10*3/uL (ref 150–400)
RBC: 4.31 MIL/uL (ref 4.22–5.81)
RDW: 13.8 % (ref 11.5–15.5)
WBC: 4.3 10*3/uL (ref 4.0–10.5)

## 2017-11-23 LAB — APTT: aPTT: 28 seconds (ref 24–36)

## 2017-11-23 MED ORDER — FENTANYL CITRATE (PF) 100 MCG/2ML IJ SOLN
INTRAMUSCULAR | Status: AC | PRN
  Administered 2017-11-23: 25 ug via INTRAVENOUS

## 2017-11-23 MED ORDER — FENTANYL CITRATE (PF) 100 MCG/2ML IJ SOLN
INTRAMUSCULAR | Status: AC
  Filled 2017-11-23: qty 2

## 2017-11-23 MED ORDER — MIDAZOLAM HCL 2 MG/2ML IJ SOLN
INTRAMUSCULAR | Status: AC | PRN
  Administered 2017-11-23: 0.5 mg via INTRAVENOUS

## 2017-11-23 MED ORDER — MIDAZOLAM HCL 2 MG/2ML IJ SOLN
INTRAMUSCULAR | Status: AC
  Filled 2017-11-23: qty 2

## 2017-11-23 MED ORDER — LIDOCAINE HCL 1 % IJ SOLN
INTRAMUSCULAR | Status: AC
  Filled 2017-11-23: qty 20

## 2017-11-23 MED ORDER — SODIUM CHLORIDE 0.9 % IV SOLN: INTRAVENOUS | Status: DC

## 2017-11-23 NOTE — Progress Notes (Signed)
Ok to d/c home per Dr Barbie Banner

## 2017-11-23 NOTE — Procedures (Signed)
RUL lung nodule 18 g core times two EBL 0 Comp 0

## 2017-11-23 NOTE — H&P (Signed)
Chief Complaint: Patient was seen in consultation today for right lung lesion biopsy at the request of Bruning,Ashlyn  Referring Physician(s): Bruning,Ashlyn Dr Tammi Klippel   Supervising Physician: Marybelle Killings  Patient Status: [facility] - Out-pt  History of Present Illness: [name] is a 67 y.o. 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 Relatively mild hypermetabolism corresponding to the subpleural right upper lobe pulmonary nodule. This extent of hypermetabolism in a small nodule is suspicious for primary bronchogenic carcinoma. Favor metachronous primary. Presuming non-small-cell histology, T1aN0M0 or stage IA. 2. Aortic atherosclerosis (ICD10-I70.0), coronary artery atherosclerosis and emphysema (ICD10-J43.9). 3. Chronic calcific pancreatitis.   Past Medical History:  Diagnosis Date  . COPD (chronic obstructive pulmonary disease) ([place])   . Hypertension   . 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[place])   . Lung cancer dx'd 12/2014  . Pancreatitis    x2 stents placed to make patent duct to pancreas  . Status post left foot surgery     Past Surgical History:  Procedure Laterality Date  . COLECTOMY    . COLOSTOMY    . COLOSTOMY REVERSAL    . EUS N/A 12/14/2014   Procedure: UPPER ENDOSCOPIC ULTRASOUND (EUS) LINEAR;  Surgeon: Milus Banister, MD;  Location: [facility];  Service: Endoscopy;  Laterality: N/A;  . FOOT SURGERY Left 2006  . PANCREAS SURGERY  2007  . UPPER GI ENDOSCOPY    . VENTRAL HERNIA REPAIR  1967    Allergies: Chantix [varenicline] and Prevacid [lansoprazole]  Medications: Prior to Admission medications   Medication Sig Start Date End Date Taking? Authorizing Provider  amLODipine (NORVASC) 10 MG tablet Take 10 mg by mouth every  morning.  11/07/14  Yes [provider]  buPROPion (WELLBUTRIN XL) 150 MG 24 hr tablet Take 150 mg by mouth every morning.  10/30/14  Yes [provider]  Cholecalciferol (VITAMIN D-3) 5000 units TABS Take 1 tablet by mouth daily.   Yes [provider]  dextromethorphan-guaiFENesin (MUCINEX DM) 30-600 MG 12hr tablet Take 1-2 tablets by mouth 2 (two) times daily as needed for cough.   Yes [provider]  ibuprofen (ADVIL,MOTRIN) 200 MG tablet Take 400 mg by mouth every 6 (six) hours as needed for mild pain or moderate pain.   Yes [provider]  ipratropium-albuterol (DUONEB) 0.5-2.5 (3) MG/3ML SOLN Take 3 mLs by nebulization every 6 (six) hours as needed (shortness of breath and or wheezing).   Yes [provider]  loperamide (IMODIUM) 2 MG capsule Take 2 mg by mouth as needed for diarrhea or loose stools.   Yes [provider]  loratadine (CLARITIN) 10 MG tablet Take 10 mg by mouth daily as needed for allergies.    Yes [provider]  Menthol, Topical Analgesic, (BIOFREEZE) 4 % GEL Apply 1 application topically 3 (three) times daily as needed (pain).   Yes [provider]  metoprolol succinate (TOPROL-XL) 25 MG 24 hr tablet Take 25 mg by mouth every morning.  11/07/14  Yes [provider]  mirtazapine (REMERON) 15 MG tablet Take 15 mg by mouth at bedtime.  08/11/16  Yes [provider]  Multiple Vitamin (MULTIVITAMIN WITH MINERALS) TABS tablet Take 1 tablet by mouth every morning.   Yes [provider]  Nutritional Supplements (CARNATION BREAKFAST ESSENTIALS PO)  Take 1 Container by mouth every morning.    Yes [provider]  PROAIR HFA 108 (90 BASE) MCG/ACT inhaler Inhale 1 puff into the lungs every 4 (four) hours as needed for wheezing or shortness of breath.  10/16/14  Yes [provider]  Thiamine 50 MG CAPS Take 1 tablet by mouth daily.    Yes [provider]    vitamin B-12 (CYANOCOBALAMIN) 1000 MCG tablet Take 1 tablet by mouth daily.    Yes [provider]     Family History  Problem Relation Age of Onset  . COPD Mother   . Diabetes Father   . Cancer Brother        metastatic, unknown primary  . Cancer Paternal Grandmother     Social History   Socioeconomic History  . Marital status: Significant Other    Spouse name: Not on file  . Number of children: 3  . Years of education: 26  . Highest education level: Not on file  Occupational History  . Occupation: Retired  Scientific laboratory technician  . Financial resource strain: Not on file  . Food insecurity:    Worry: Not on file    Inability: Not on file  . Transportation needs:    Medical: Not on file    Non-medical: Not on file  Tobacco Use  . Smoking status: Current Every Day Smoker    Packs/day: 0.50    Years: 50.00    Pack years: 25.00    Types: Cigarettes  . Smokeless tobacco: Never Used  . Tobacco comment: currently 0.5 ppd, trying to quit  Substance and Sexual Activity  . Alcohol use: Yes    Comment: 6 pack of beer per day  . Drug use: No  . Sexual activity: Yes    Birth control/protection: Injection  Lifestyle  . Physical activity:    Days per week: Not on file    Minutes per session: Not on file  . Stress: Not on file  Relationships  . Social connections:    Talks on phone: Not on file    Gets together: Not on file    Attends religious service: Not on file    Active member of club or organization: Not on file    Attends meetings of clubs or organizations: Not on file    Relationship status: Not on file  Other Topics Concern  . Not on file  Social History Narrative   Lives at home w/ a friend   Left-handed   Caffeine: 2 cups of coffee per day    Review of Systems: A 12 point ROS discussed and pertinent positives are indicated in the HPI above.  All other systems are negative.  Review of Systems  Constitutional: Positive for fatigue. Negative for activity  change, appetite change and fever.  Respiratory: Positive for cough. Negative for shortness of breath.   Cardiovascular: Negative for chest pain.  Musculoskeletal: Negative for back pain.  Psychiatric/Behavioral: Negative for behavioral problems and confusion.    Vital Signs: BP 124/80 (BP Location: Right Arm)   Pulse 63   Temp 97.9 F (36.6 C) (Oral)   Ht 5\' 3"  (1.6 m)   Wt 98 lb (44.5 kg)   SpO2 98%   BMI 17.36 kg/m   Physical Exam  Constitutional: He is oriented to person, place, and time.  Cardiovascular: Normal rate, regular rhythm and normal heart sounds.  Pulmonary/Chest: Effort normal and breath sounds normal.  Abdominal: Soft. Bowel sounds are normal.  Musculoskeletal: Normal  range of motion.  Neurological: He is alert and oriented to person, place, and time.  Skin: Skin is warm and dry.  Psychiatric: He has a normal mood and affect. His behavior is normal. Judgment and thought content normal.  Nursing note and vitals reviewed.   Imaging: No results found.  Labs:  CBC: No results for input(s): WBC, HGB, HCT, PLT in the last 8760 hours.  COAGS: No results for input(s): INR, APTT in the last 8760 hours.  BMP: Recent Labs    03/30/17 1540 05/11/17 1437 10/05/17 0839  NA  --  137  --   K  --  4.5  --   CL  --  100  --   CO2  --  24  --   GLUCOSE  --  102*  --   BUN  --  8 12  CALCIUM  --  9.1  --   CREATININE 0.90 0.74* 0.83  GFRNONAA  --  97 >60  GFRAA  --  112 >60    LIVER FUNCTION TESTS: No results for input(s): BILITOT, AST, ALT, ALKPHOS, PROT, ALBUMIN in the last 8760 hours.  TUMOR MARKERS: No results for input(s): AFPTM, CEA, CA199, CHROMGRNA in the last 8760 hours.  Assessment and Plan:  Left lung Ca 2016-- Tx with Radiation New nodule followed for several months Now enlarging and +PET +smoker Scheduled for biopsy prior to additional treatment Risks and benefits discussed with the patient including, but not limited to bleeding,  hemoptysis, respiratory failure requiring intubation, infection, pneumothorax requiring chest tube placement, stroke from air embolism or even death.  All of the patient's questions were answered, patient is agreeable to proceed. Consent signed and in chart.   Thank you for this interesting consult.  I greatly enjoyed meeting [name] and look forward to participating in their care.  A copy of this report was sent to the requesting provider on this date.  Electronically Signed: Lavonia Drafts, PA-C 11/23/2017, 10:20 AM   I spent a total of  30 Minutes   in face to face in clinical consultation, greater than 50% of which was counseling/coordinating care for right lung mass biopsy

## 2017-11-23 NOTE — Discharge Instructions (Addendum)
Needle Biopsy of the Lung, Care After °This sheet gives you information about how to care for yourself after your procedure. Your health care provider may also give you more specific instructions. If you have problems or questions, contact your health care provider. °What can I expect after the procedure? °After the procedure, it is common to have: °· Soreness, pain, and tenderness where a tissue sample was taken (biopsy site). °· A cough. °· A sore throat. ° °Follow these instructions at home: °Biopsy site care °· Follow instructions from your health care provider about when to remove the bandage that was placed on the biopsy site. °· Keep the bandage dry until it has been removed. °· Check your biopsy site every day for signs of infection. Check for: °? More redness, swelling, or pain. °? More fluid or blood. °? Warmth to the touch. °? Pus or a bad smell. °General instructions °· Rest as directed by your health care provider. Ask your health care provider what activities are safe for you. °· Do not take baths, swim, or use a hot tub until your health care provider approves. °· Take over-the-counter and prescription medicines only as told by your health care provider. °· If you have airplane travel scheduled, talk with your health care provider about when it is safe for you to travel by airplane. °· It is up to you to get the results of your procedure. Ask your health care provider, or the department that is doing the procedure, when your results will be ready. °· Keep all follow-up visits as told by your health care provider. This is important. °Contact a health care provider if: °· You have more redness, swelling, or pain around your biopsy site. °· You have more fluid or blood coming from your biopsy site. °· Your biopsy site feels warm to the touch. °· You have pus or a bad smell coming from your biopsy site. °· You have a fever. °· You have pain that does not get better with medicine. °Get help right away  if: °· You have problems breathing. °· You have chest pain. °· You cough up blood. °· You faint. °· You have a fast heart rate. °Summary °· After a needle biopsy of the lung, it is common to have a cough, a sore throat, or soreness, pain, and tenderness where a tissue sample was taken (biopsy site). °· You should check your biopsy area every day for signs of infection, including pus or a bad smell, warmth, more fluid or blood, or more redness, swelling, or pain. °· You should not take baths, swim, or use a hot tub until your health care provider approves. °· It is up to you to get the results of your procedure. Ask your health care provider, or the department that is doing the procedure, when your results will be ready. °This information is not intended to replace advice given to you by your health care provider. Make sure you discuss any questions you have with your health care provider. °Document Released: 03/23/2007 Document Revised: 04/16/2016 Document Reviewed: 04/16/2016 °Elsevier Interactive Patient Education © 2017 [employer] ° °Moderate Conscious Sedation, Adult, Care After °These instructions provide you with information about caring for yourself after your procedure. Your health care provider may also give you more specific instructions. Your treatment has been planned according to current medical practices, but problems sometimes occur. Call your health care provider if you have any problems or questions after your procedure. °What can I expect after the   procedure? °After your procedure, it is common: °· To feel sleepy for several hours. °· To feel clumsy and have poor balance for several hours. °· To have poor judgment for several hours. °· To vomit if you eat too soon. ° °Follow these instructions at home: °For at least 24 hours after the procedure: ° °· Do not: °? Participate in activities where you could fall or become injured. °? Drive. °? Use heavy machinery. °? Drink alcohol. °? Take sleeping  pills or medicines that cause drowsiness. °? Make important decisions or sign legal documents. °? Take care of children on your own. °· Rest. °Eating and drinking °· Follow the diet recommended by your health care provider. °· If you vomit: °? Drink water, juice, or soup when you can drink without vomiting. °? Make sure you have little or no nausea before eating solid foods. °General instructions °· Have a responsible adult stay with you until you are awake and alert. °· Take over-the-counter and prescription medicines only as told by your health care provider. °· If you smoke, do not smoke without supervision. °· Keep all follow-up visits as told by your health care provider. This is important. °Contact a health care provider if: °· You keep feeling nauseous or you keep vomiting. °· You feel light-headed. °· You develop a rash. °· You have a fever. °Get help right away if: °· You have trouble breathing. °This information is not intended to replace advice given to you by your health care provider. Make sure you discuss any questions you have with your health care provider. °Document Released: 03/16/2013 Document Revised: 10/29/2015 Document Reviewed: 09/15/2015 °Elsevier Interactive Patient Education © 2018 [employer] ° °

## 2017-11-23 NOTE — Sedation Documentation (Signed)
BedRest to start at 1220.

## 2017-11-26 ENCOUNTER — Telehealth: Payer: Self-pay | Admitting: Radiation Oncology

## 2017-11-26 NOTE — Telephone Encounter (Signed)
I called the patient to let him know we would recommend RUL SBRT given the pathologic confirmation of his lung cancer. He is in agreement and will come on Monday for simulation.

## 2017-11-30 ENCOUNTER — Ambulatory Visit
Admission: RE | Admit: 2017-11-30 | Discharge: 2017-11-30 | Disposition: A | Discharge status: Home / Self Care | Payer: Medicare Other | Source: Ambulatory Visit | Attending: Radiation Oncology | Admitting: Radiation Oncology

## 2017-11-30 DIAGNOSIS — Z510 Encounter for antineoplastic radiation therapy: Secondary | ICD-10-CM | POA: Insufficient documentation

## 2017-11-30 DIAGNOSIS — C3411 Malignant neoplasm of upper lobe, right bronchus or lung: Secondary | ICD-10-CM | POA: Diagnosis not present

## 2017-11-30 NOTE — Progress Notes (Signed)
  Radiation Oncology         [phone] ________________________________  Name: [name] MRN: [id number]  Date: 11/30/2017  DOB: 06/18/1950  STEREOTACTIC BODY RADIOTHERAPY SIMULATION AND TREATMENT PLANNING NOTE    ICD-10-CM   1. 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  NARRATIVE:  The patient was brought to the [facility].  Identity was confirmed.  All relevant records and images related to the planned course of therapy were reviewed.  The patient freely provided informed written consent to proceed with treatment after reviewing the details related to the planned course of therapy. The consent form was witnessed and verified by the simulation staff.  Then, the patient was set-up in a stable reproducible  supine position for radiation therapy.  A BodyFix immobilization pillow was fabricated for reproducible positioning.  Then I personally applied the abdominal compression paddle to limit respiratory excursion.  4D respiratoy motion management CT images were obtained.  Surface markings were placed.  The CT images were loaded into the planning software.  Then, using Cine, MIP, and standard views, the internal target volume (ITV) and planning target volumes (PTV) were delinieated, and avoidance structures were contoured.  Treatment planning then occurred.  The radiation prescription was entered and confirmed.  A total of two complex treatment devices were fabricated in the form of the BodyFix immobilization pillow and a neck accuform cushion.  I have requested : 3D Simulation  I have requested a DVH of the following structures: Heart, Lungs, Esophagus, Chest Wall, Brachial Plexus, Major Blood Vessels, and targets.  SPECIAL TREATMENT PROCEDURE:  The planned course of therapy using radiation constitutes a special treatment procedure. Special care is  required in the management of this patient for the following reasons. This treatment constitutes a Special Treatment Procedure for the following reason: [ High dose per fraction requiring special monitoring for increased toxicities of treatment including daily imaging..  The special nature of the planned course of radiotherapy will require increased physician supervision and oversight to ensure patient's safety with optimal treatment outcomes.  RESPIRATORY MOTION MANAGEMENT SIMULATION:  In order to account for effect of respiratory motion on target structures and other organs in the planning and delivery of radiotherapy, this patient underwent respiratory motion management simulation.  To accomplish this, when the patient was brought to the CT simulation planning suite, 4D respiratoy motion management CT images were obtained.  The CT images were loaded into the planning software.  Then, using a variety of tools including Cine, MIP, and standard views, the target volume and planning target volumes (PTV) were delineated.  Avoidance structures were contoured.  Treatment planning then occurred.  Dose volume histograms were generated and reviewed for each of the requested structure.  The resulting plan was carefully reviewed and approved today.  PLAN:  The patient will receive 54 Gy in 3 fractions.  ________________________________  Sheral Apley Tammi Klippel, M.D.  This document serves as a record of services personally performed by Tyler Pita, MD. It was created on his behalf by Bethann Humble, a trained medical scribe. The creation of this record is based on the scribe's personal observations and the provider's statements to them. This document has been checked and approved by the attending provider.

## 2017-12-02 DIAGNOSIS — Z510 Encounter for antineoplastic radiation therapy: Secondary | ICD-10-CM | POA: Diagnosis not present

## 2017-12-02 DIAGNOSIS — C3411 Malignant neoplasm of upper lobe, right bronchus or lung: Secondary | ICD-10-CM | POA: Diagnosis not present

## 2017-12-03 ENCOUNTER — Other Ambulatory Visit: Payer: Self-pay | Admitting: *Deleted

## 2017-12-07 ENCOUNTER — Ambulatory Visit
Admission: RE | Admit: 2017-12-07 | Discharge: 2017-12-07 | Disposition: A | Discharge status: Home / Self Care | Payer: Medicare Other | Source: Ambulatory Visit | Attending: Radiation Oncology | Admitting: Radiation Oncology

## 2017-12-07 DIAGNOSIS — C3411 Malignant neoplasm of upper lobe, right bronchus or lung: Secondary | ICD-10-CM | POA: Insufficient documentation

## 2017-12-07 DIAGNOSIS — Z510 Encounter for antineoplastic radiation therapy: Secondary | ICD-10-CM | POA: Insufficient documentation

## 2017-12-09 ENCOUNTER — Ambulatory Visit
Admission: RE | Admit: 2017-12-09 | Discharge: 2017-12-09 | Disposition: A | Discharge status: Home / Self Care | Payer: Medicare Other | Source: Ambulatory Visit | Attending: Radiation Oncology | Admitting: Radiation Oncology

## 2017-12-09 DIAGNOSIS — Z510 Encounter for antineoplastic radiation therapy: Secondary | ICD-10-CM | POA: Diagnosis not present

## 2017-12-09 DIAGNOSIS — C3411 Malignant neoplasm of upper lobe, right bronchus or lung: Secondary | ICD-10-CM | POA: Diagnosis not present

## 2017-12-11 ENCOUNTER — Ambulatory Visit
Admission: RE | Admit: 2017-12-11 | Discharge: 2017-12-11 | Disposition: A | Discharge status: Home / Self Care | Payer: Medicare Other | Source: Ambulatory Visit | Attending: Radiation Oncology | Admitting: Radiation Oncology

## 2017-12-11 ENCOUNTER — Encounter: Payer: Self-pay | Admitting: Radiation Oncology

## 2017-12-11 DIAGNOSIS — Z510 Encounter for antineoplastic radiation therapy: Secondary | ICD-10-CM | POA: Diagnosis not present

## 2017-12-11 DIAGNOSIS — C3411 Malignant neoplasm of upper lobe, right bronchus or lung: Secondary | ICD-10-CM | POA: Diagnosis not present

## 2017-12-11 NOTE — Progress Notes (Signed)
°  Radiation Oncology         [phone] ________________________________  Name: [name] MRN: [id number]  Date: 12/11/2017  DOB: 11-20-1950  End of Treatment Note  Diagnosis:   67 yo man with a new Stage IApoorly differentiated adenosquamous carcinoma of the right upper lung.     Indication for treatment:  Curative, Definitive SBRT       Radiation treatment dates:   12/07/2017 - 12/11/2017  Site/dose:   The RUL target was treated to 54 Gy in 3 fractions of 18 Gy  Beams/energy:   The patient was treated using stereotactic body radiotherapy according to a 3D conformal radiotherapy plan.  Volumetric arc fields were employed to deliver 6 MV X-rays.  Image guidance was performed with per fraction cone beam CT prior to treatment under personal MD supervision.  Immobilization was achieved using BodyFix Pillow.  Narrative: The patient tolerated radiation treatment relatively well.    Plan: The patient has completed radiation treatment. The patient will return to radiation oncology clinic for routine followup in one month. I advised them to call or return sooner if they have any questions or concerns related to their recovery or treatment. ________________________________  Sheral Apley. Tammi Klippel, M.D.    This document serves as a record of services personally performed by Tyler Pita, MD. It was created on his behalf by Margit Banda, a trained medical scribe. The creation of this record is based on the scribe's personal observations and the provider's statements to them. This document has been checked and approved by the attending provider.

## 2018-01-08 ENCOUNTER — Telehealth: Payer: Self-pay | Admitting: Urology

## 2018-01-08 DIAGNOSIS — C3411 Malignant neoplasm of upper lobe, right bronchus or lung: Secondary | ICD-10-CM

## 2018-01-08 NOTE — Telephone Encounter (Signed)
I called to return the patient's call and spoke with him and his wife.  He reports that he is feeling well and denies increased cough, shortness of breath, chest pain, fevers, chills or night sweats.  He was scheduled for a routine 1 month follow-up visit on Tuesday, January 12, 2018 but request to cancel that visit since he is feeling well.  He will have a follow-up chest CT within the next 2 to 3 weeks to assess treatment response and we will call with those results.  He is aware that as long as that scan appears stable, the plan will be to proceed with serial CT chest scans every 3 to 6 months to continue to monitor for any disease progression or recurrence.  We will plan to see him back in 3 months unless otherwise indicated on his upcoming CT chest scan.  He is in agreement with and is comfortable with this plan.  He knows to call at anytime in the interim with questions or concerns.   Nicholos Johns, PA-C

## 2018-01-12 ENCOUNTER — Ambulatory Visit: Admission: RE | Admit: 2018-01-12 | Payer: Medicare Other | Source: Ambulatory Visit | Admitting: Urology

## 2018-02-01 DIAGNOSIS — M79662 Pain in left lower leg: Secondary | ICD-10-CM | POA: Diagnosis not present

## 2018-02-01 DIAGNOSIS — I739 Peripheral vascular disease, unspecified: Secondary | ICD-10-CM | POA: Diagnosis not present

## 2018-02-02 ENCOUNTER — Other Ambulatory Visit: Payer: Self-pay | Admitting: Internal Medicine

## 2018-02-02 DIAGNOSIS — M79662 Pain in left lower leg: Secondary | ICD-10-CM

## 2018-02-15 ENCOUNTER — Ambulatory Visit
Admission: RE | Admit: 2018-02-15 | Discharge: 2018-02-15 | Disposition: A | Discharge status: Home / Self Care | Payer: Medicare Other | Source: Ambulatory Visit | Attending: Internal Medicine | Admitting: Internal Medicine

## 2018-02-15 DIAGNOSIS — M79605 Pain in left leg: Secondary | ICD-10-CM | POA: Diagnosis not present

## 2018-02-15 DIAGNOSIS — M79662 Pain in left lower leg: Secondary | ICD-10-CM

## 2018-03-01 ENCOUNTER — Other Ambulatory Visit: Payer: Medicare Other

## 2018-03-08 ENCOUNTER — Ambulatory Visit
Admission: RE | Admit: 2018-03-08 | Discharge: 2018-03-08 | Disposition: A | Discharge status: Home / Self Care | Payer: Medicare Other | Source: Ambulatory Visit | Attending: Internal Medicine | Admitting: Internal Medicine

## 2018-03-08 DIAGNOSIS — M79662 Pain in left lower leg: Secondary | ICD-10-CM

## 2018-03-08 DIAGNOSIS — I70212 Atherosclerosis of native arteries of extremities with intermittent claudication, left leg: Secondary | ICD-10-CM | POA: Diagnosis not present

## 2018-03-15 DIAGNOSIS — L309 Dermatitis, unspecified: Secondary | ICD-10-CM | POA: Diagnosis not present

## 2018-03-15 DIAGNOSIS — D485 Neoplasm of uncertain behavior of skin: Secondary | ICD-10-CM | POA: Diagnosis not present

## 2018-03-15 DIAGNOSIS — L299 Pruritus, unspecified: Secondary | ICD-10-CM | POA: Diagnosis not present

## 2018-03-19 DIAGNOSIS — I10 Essential (primary) hypertension: Secondary | ICD-10-CM | POA: Diagnosis not present

## 2018-03-19 DIAGNOSIS — R0609 Other forms of dyspnea: Secondary | ICD-10-CM | POA: Diagnosis not present

## 2018-03-19 DIAGNOSIS — I739 Peripheral vascular disease, unspecified: Secondary | ICD-10-CM | POA: Diagnosis not present

## 2018-03-19 DIAGNOSIS — I998 Other disorder of circulatory system: Secondary | ICD-10-CM | POA: Diagnosis not present

## 2018-03-19 DIAGNOSIS — Z720 Tobacco use: Secondary | ICD-10-CM | POA: Diagnosis not present

## 2018-03-22 DIAGNOSIS — I10 Essential (primary) hypertension: Secondary | ICD-10-CM | POA: Diagnosis not present

## 2018-03-22 DIAGNOSIS — R0602 Shortness of breath: Secondary | ICD-10-CM | POA: Diagnosis not present

## 2018-03-22 DIAGNOSIS — I739 Peripheral vascular disease, unspecified: Secondary | ICD-10-CM | POA: Diagnosis not present

## 2018-03-26 DIAGNOSIS — I998 Other disorder of circulatory system: Secondary | ICD-10-CM

## 2018-03-26 DIAGNOSIS — I10 Essential (primary) hypertension: Secondary | ICD-10-CM | POA: Diagnosis not present

## 2018-03-26 DIAGNOSIS — R0602 Shortness of breath: Secondary | ICD-10-CM | POA: Diagnosis not present

## 2018-03-26 HISTORY — DX: Other disorder of circulatory system: I99.8

## 2018-03-28 DIAGNOSIS — I739 Peripheral vascular disease, unspecified: Secondary | ICD-10-CM

## 2018-03-28 NOTE — Progress Notes (Signed)
Lower extremity arterial duplex 03/26/2018: Severe diffuse calcific plaque throughout bilateral lower extremity with severe diffuse luminal narrowing.  Right prox and mid SFA occlusion and reconstitution distally.  Dampened and monophasic waveform throughout the left leg suggests proximal (iliac artery) stenosis. Moderate velocity increase at the left distal superficial femoral artery suggests >50% stenosis. This exam reveals severely decreased perfusion of the bilateral lower extremity, noted at the post tibial artery level (RABI 0.42, LABI 0.36). Consider further work up.  Labs 03/22/2018: H/H 14/39. MCV 94. Platelets 254 GLucose 91. BUN/Cr 11/0.59. eGFR normal. Na/K 129/5.2

## 2018-03-28 NOTE — H&P (Signed)
[name] 04/06/18 9:00 AM Location: [facility] Patient #: [id number] DOB: [birth year] Undefined / Language: Undefined / Race: Undefined Male   History of Present Illness Scott Mormon MD; 04/06/2018 11:09 AM) Patient words: NP Eval STAT for PVP;.  The patient is a 67 year old male, accompanied by his other (significant other, [name]) during the visit, who presents with peripheral vascular disease. Patient referred for evaluation of peripheral artery disease by Janie Morning, MD  67 year old Caucasian male with hypertension, tobacco abuse, history of adeno carcinoma lung status post radiation therapy.  She has had progressively worsening severe claudication and left more than right leg for over one month. Claudication has progressed to walking lately 50 feet before having to stop. He does not have any ulcers or wounds on his feet. Recent ABI performed at PCP office showed monophasic flow in both lower extremities with ABI 0.39 on the left, and 0.75 on right. He has stable exertional dyspnea, but denies any chest pain.  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  He is a smoker for over 50 years. He currently smokes 10 cigarettes a day, has smoked up to 2 packs a day in the past. He has tried Chantix before which cause nightmares. Certain nicotine patches give him some skin allergy. He is currently using nicotine tablets.  BP is elevated on repeated checks today. Much lower at home, and at previous PCP visit.   Problem List/Past Medical Sherre Scarlet Joya Gaskins; 06-Apr-2018 9:05 AM) Benign essential hypertension (I10)  Lung cancer (C34.90)  Critical lower limb ischemia (I99.8)  EKG April 06, 2018: Sinus rhythm 70 bpm. Rightward axis. Possible old anteroseptal infact.  Allergies Westley Chandler; 04/06/18 8:58 AM) Chantix  *PSYCHOTHERAPEUTIC AND NEUROLOGICAL AGENTS - MISC.*  crazy dreams PriLOSEC *ULCER DRUGS/ANTISPASMODICS/ANTICHOLINERGICS*  Itching.  Family History Westley Chandler; Apr 06, 2018 9:02 AM) Mother  Deceased. COPD Blockage in [place] Father  In good health. no heart issues Sister 1  younger no heart issues Brother 4  1 older 3 younger, 1 has blockages in legs, 1 had heart attack , 2 [facility]  Social History Westley Chandler; 04/06/18 9:04 AM) Current tobacco use  Current every day smoker. a pack a day Alcohol Use  Heavy alcohol use, Drinks beer. 4 a day Marital status  Widowed. Number of Children  2. Living Situation  Lives with caregiver. friend  Past Surgical History Westley Chandler; 04-06-18 9:06 AM) Colon Removal - Partial [1982]: repair Foot Surgery - Left [2000]:  Medication History Sherre Scarlet Joya Gaskins; 04/06/2018 9:16 AM) Metoprolol Succinate ER (25MG Tablet ER 24HR, 1 Oral daily) Active. buPROPion HCl ER (XL) (150MG Tablet ER 24HR, 1 Oral daily) Active. amLODIPine Besylate (10MG Tablet, 1 Oral daily) Active. Triamcinolone Acetonide (0.1% Ointment, External daily) Active. Triamcinolone Acetonide (0.025% Cream, External daily) Active. Albuterol Sulfate ((2.5 MG/3ML)0.083% Nebulized Soln, Inhalation as needed) Active. ProAir HFA (108 (90 Base)MCG/ACT Aerosol Soln, Inhalation as needed) Active. Ipratropium Bromide (0.02% Solution, 2 Inhalation daily) Active. Fluticasone Propionate (Inhal) (50MCG/BLIST Aero Pow Br Act, 1 Inhalation daily) Active. Xarelto (2.5MG Tablet, 2 Oral daily) Active. Aspirin (81MG Capsule, 1 Oral daily) Active. Medications Reconciled (Pt brought medications)  Diagnostic Studies History Westley Chandler; 04-06-2018 9:09 AM) Colonoscopy  Normal. Endoscopy [1982]: Normal. Lower Extremity Dopplers [2019]: ABI    Review of Systems Scott Mormon MD; 06-Apr-2018 10:48 AM) General Not Present- Appetite Loss and Weight Gain. Respiratory Not  Present- Chronic  Cough and Wakes up from Sleep Wheezing or Short of Breath. Cardiovascular Present- Claudications, Difficulty Breathing On Exertion and Hypertension. Not Present- Chest Pain, Difficulty Breathing Lying Down, Fainting, Palpitations and Shortness of Breath. Gastrointestinal Not Present- Black, Tarry Stool and Difficulty Swallowing. Musculoskeletal Not Present- Decreased Range of Motion and Muscle Atrophy. Neurological Not Present- Attention Deficit. Psychiatric Not Present- Personality Changes and Suicidal Ideation. Endocrine Not Present- Cold Intolerance and Heat Intolerance. Hematology Not Present- Abnormal Bleeding. All other systems negative  Vitals Georgeanna Harrison; 03/19/2018 10:00 AM) 03/19/2018 10:00 AM Weight: 98 lb Height: 61in Body Surface Area: 1.4 m Body Mass Index: 18.52 kg/m  Pulse: 67 (Regular)  P.OX: 98% (Room air) BP: 171/87 (Sitting, Right Arm, Standard)    03/19/2018 9:57 AM Weight: 98 lb Height: 61in Body Surface Area: 1.4 m Body Mass Index: 18.52 kg/m  Pulse: 69 (Regular)  P.OX: 97% (Room air) BP: 163/89 (Sitting, Left Arm, Standard)    03/19/2018 8:40 AM Weight: 98 lb Height: 61in Body Surface Area: 1.4 m Body Mass Index: 18.52 kg/m  Pulse: 76 (Regular)  P.OX: 99% (Room air) BP: 167/81 (Sitting, Left Arm, Standard)       Physical Exam Joya Gaskins Esther Hardy MD; 03/19/2018 11:16 AM) General Mental Status-Alert. General Appearance-Cooperative and Appears stated age. Build & Nutrition-Moderately built.  Head and Neck Face -Note: Dentures in place.  Thyroid Gland Characteristics - normal size and consistency and no palpable nodules.  Chest and Lung Exam Chest and lung exam reveals -quiet, even and easy respiratory effort with no use of accessory muscles, non-tender and on auscultation, normal breath sounds, no adventitious sounds.  Cardiovascular Cardiovascular examination reveals  -normal heart sounds, regular rate and rhythm with no murmurs, carotid auscultation reveals no bruits and abdominal aorta auscultation reveals no bruits and no prominent pulsation.  Abdomen Palpation/Percussion Palpation and Percussion of the abdomen reveal - Non Tender and No hepatosplenomegaly.  Peripheral Vascular Lower Extremity Inspection - Bilateral - Loss of hair, No Digital ulcers, Not Gangrenous. Palpation - Femoral pulse - Left - 0+ and Absent. Right - 2+. Popliteal pulse - Left - Absent. Right - 1+. Dorsalis pedis pulse - Bilateral - Absent. Posterior tibial pulse - Right - 1/2+. Bilateral - Absent. Carotid arteries - Bilateral-No Carotid bruit.  Neurologic Neurologic evaluation reveals -alert and oriented x 3 with no impairment of recent or remote memory. Motor-Grossly intact without any focal deficits.  Musculoskeletal Global Assessment Left Lower Extremity - no deformities, masses or tenderness, no known fractures. Right Lower Extremity - no deformities, masses or tenderness, no known fractures.    Assessment & Plan Joya Gaskins Esther Hardy MD; 03/19/2018 11:15 AM) Severe claudication (I73.9) Story: Rutherford class IIb Critical lower limb ischemia (I99.8) Story: ABI Left 0.39 ABI Right 0.75 Current Plans Started Rosuvastatin Calcium 20MG, 1 (one) Tablet Once daily, #30, 30 days starting 03/19/2018, Ref. x1. Future Plans 03/26/2018: Complete duplex ultrasound of arteries of lower extremity (48250) - one time Benign essential hypertension (I10) Exertional dyspnea (R06.09) Current Plans METABOLIC PANEL, BASIC (03704) CBC & PLATELETS (AUTO) (88891) Future Plans 03/26/2018: Echocardiography, transthoracic, real-time with image documentation (2D), includes M-mode recording, when performed, complete, with spectral Doppler echocardiography, and with color flow Doppler echocardiography (69450) - one time 03/22/2018: Myocardial perfusion imaging, tomographic (SPECT)  (including attenuation correction, qualitative or quantitative wall motion, ejection fraction by first pass or gated technique, additional quantification, when performed) - one time Tobacco abuse (Z72.0) Current Plans Brief intervention for smoking cessation (38882) Laboratory examination (Z01.89) Story: Labs 08/10/2017: H/H  14/40. Platelets 223. MCV 84. Glucose 94. BUN/creatinine 6/0.7. EGFR normal. Sodium 133, potassium 4.6. Rest of the CMP normal. TSH 1.5 Normal Cholesterol 192, HDL 62, LDL 108, triglycerides 112  Note:Assessment/ Recommendations:  67 year old Caucasian male with hypertension, tobacco abuse, history of adeno carcinoma lung status post radiation therapy, now with severe claudication.  Claudication: Rutherford class IIb. Ciritical limb ischemia on left with ABI 0.39. ABI on Rt 0.75. No ulcers, gangrene, resting iscehmia. Continue aspirin 18 mg daily, xarelto 2.5 mg bid. Started rosuvaststin 20 mg daily. Continue current antihypertensive therapy including metoprolol succinate 25 mg q.d., amlodipine 10 mg daily. Blood pressure suboptimal today, but unusual for him. Asked him to take and next dose of Toprol succinate today. Recommend routine blood pressure check at home. If systolic blood pressure stays above 140, increased the Toprol succinate 50 mg daily. We'll obtain duplex ultrasound with plan for peripheral angiography with possible intervention on 03/30/2018. In view of his exertional dyspnea and risk factors for coronary artery disease, we'll obtain echocardiogram and Lexiscan nuclear stress test. COPD also likely contributing to his dyspnea.  Patient understands the risks, benefits, alternatives including medical therapy, CT angiography. Patient understands <1-2% risk of death, embolic complications, bleeding, infection, renal failure, urgent surgical revascularization, but not limited to these and wants to proceed.  Hypertension: Mnagement as above.  Tobacco  cessation counseling:  Tobacco abuse with PAD  - Currently smoking 1/2cks/day  - Patient was informed of the dangers of tobacco abuse including stroke, cancer, and MI, as well as benefits of tobacco cessation. - Patient is willing to quit at this time. - 62mns were spent counseling patient cessation techniques. We discussed various methods to help quit smoking, including deciding on a date to quit, joining a support group, pharmacological agents- nicotine gum/patch/lozenges, chantix. Patient would like to use nicotine tabelts that he currently has - I will reassess his progress at his next follow-up visit.  H/o lung cancer: Currently in remission.  Total time spent with patient was 40 minutes and greater than 50% of that time was spent in counseling and coordination care with the patient regarding complex decision making and discussion as state above.  Cc DJanie Morning MD  Signed electronically by MNigel Mormon MD (03/19/2018 11:16 AM)

## 2018-03-29 DIAGNOSIS — L209 Atopic dermatitis, unspecified: Secondary | ICD-10-CM | POA: Diagnosis not present

## 2018-03-30 ENCOUNTER — Ambulatory Visit (HOSPITAL_COMMUNITY)
Admission: RE | Admit: 2018-03-30 | Discharge: 2018-03-30 | Disposition: A | Discharge status: Home / Self Care | Payer: Medicare Other | Source: Ambulatory Visit | Attending: Cardiology | Admitting: Cardiology

## 2018-03-30 ENCOUNTER — Encounter (HOSPITAL_COMMUNITY): Payer: Self-pay | Admitting: Cardiology

## 2018-03-30 ENCOUNTER — Other Ambulatory Visit: Payer: Self-pay

## 2018-03-30 ENCOUNTER — Encounter (HOSPITAL_COMMUNITY)
Admission: RE | Disposition: A | Discharge status: Home / Self Care | Payer: Self-pay | Source: Ambulatory Visit | Attending: Cardiology

## 2018-03-30 DIAGNOSIS — I739 Peripheral vascular disease, unspecified: Secondary | ICD-10-CM

## 2018-03-30 DIAGNOSIS — Z85118 Personal history of other malignant neoplasm of bronchus and lung: Secondary | ICD-10-CM | POA: Diagnosis not present

## 2018-03-30 DIAGNOSIS — I70211 Atherosclerosis of native arteries of extremities with intermittent claudication, right leg: Secondary | ICD-10-CM | POA: Diagnosis not present

## 2018-03-30 DIAGNOSIS — I10 Essential (primary) hypertension: Secondary | ICD-10-CM | POA: Insufficient documentation

## 2018-03-30 DIAGNOSIS — Z923 Personal history of irradiation: Secondary | ICD-10-CM | POA: Diagnosis not present

## 2018-03-30 DIAGNOSIS — I70209 Unspecified atherosclerosis of native arteries of extremities, unspecified extremity: Secondary | ICD-10-CM | POA: Diagnosis present

## 2018-03-30 DIAGNOSIS — I70212 Atherosclerosis of native arteries of extremities with intermittent claudication, left leg: Secondary | ICD-10-CM | POA: Diagnosis not present

## 2018-03-30 DIAGNOSIS — F17210 Nicotine dependence, cigarettes, uncomplicated: Secondary | ICD-10-CM | POA: Diagnosis not present

## 2018-03-30 HISTORY — PX: PERIPHERAL VASCULAR INTERVENTION: CATH118257

## 2018-03-30 HISTORY — PX: ABDOMINAL AORTOGRAM W/LOWER EXTREMITY: CATH118223

## 2018-03-30 LAB — POCT ACTIVATED CLOTTING TIME
ACTIVATED CLOTTING TIME: 241 s
Activated Clotting Time: 213 seconds

## 2018-03-30 SURGERY — ABDOMINAL AORTOGRAM W/LOWER EXTREMITY
Anesthesia: LOCAL

## 2018-03-30 MED ORDER — FENTANYL CITRATE (PF) 100 MCG/2ML IJ SOLN
INTRAMUSCULAR | Status: AC
  Filled 2018-03-30: qty 2

## 2018-03-30 MED ORDER — BUPROPION HCL ER (XL) 150 MG PO TB24: 150.0000 mg | ORAL_TABLET | Freq: Every morning | ORAL | Status: DC

## 2018-03-30 MED ORDER — LIDOCAINE HCL (PF) 1 % IJ SOLN
INTRAMUSCULAR | Status: AC
  Filled 2018-03-30: qty 30

## 2018-03-30 MED ORDER — METOPROLOL SUCCINATE ER 50 MG PO TB24: 50.0000 mg | ORAL_TABLET | Freq: Every morning | ORAL | Status: DC

## 2018-03-30 MED ORDER — SODIUM CHLORIDE 0.9 % IV SOLN
INTRAVENOUS | Status: AC
  Administered 2018-03-30: 06:00:00 via INTRAVENOUS

## 2018-03-30 MED ORDER — HEPARIN (PORCINE) IN NACL 1000-0.9 UT/500ML-% IV SOLN
INTRAVENOUS | Status: AC
  Filled 2018-03-30: qty 1000

## 2018-03-30 MED ORDER — MIDAZOLAM HCL 2 MG/2ML IJ SOLN
INTRAMUSCULAR | Status: DC | PRN
  Administered 2018-03-30: 1 mg via INTRAVENOUS

## 2018-03-30 MED ORDER — CLOPIDOGREL BISULFATE 300 MG PO TABS
ORAL_TABLET | ORAL | Status: DC | PRN
  Administered 2018-03-30: 300 mg via ORAL

## 2018-03-30 MED ORDER — MIDAZOLAM HCL 2 MG/2ML IJ SOLN
INTRAMUSCULAR | Status: AC
  Filled 2018-03-30: qty 2

## 2018-03-30 MED ORDER — IODIXANOL 320 MG/ML IV SOLN
INTRAVENOUS | Status: DC | PRN
  Administered 2018-03-30: 220 mL via INTRA_ARTERIAL

## 2018-03-30 MED ORDER — LIDOCAINE HCL (PF) 1 % IJ SOLN
INTRAMUSCULAR | Status: DC | PRN
  Administered 2018-03-30: 12 mL

## 2018-03-30 MED ORDER — METOPROLOL SUCCINATE ER 50 MG PO TB24: 50.0000 mg | ORAL_TABLET | Freq: Every morning | ORAL | 3 refills | Status: DC

## 2018-03-30 MED ORDER — FENTANYL CITRATE (PF) 100 MCG/2ML IJ SOLN
INTRAMUSCULAR | Status: DC | PRN
  Administered 2018-03-30: 50 ug via INTRAVENOUS

## 2018-03-30 MED ORDER — CLOPIDOGREL BISULFATE 75 MG PO TABS: 75.0000 mg | ORAL_TABLET | Freq: Every day | ORAL | 2 refills | Status: DC

## 2018-03-30 MED ORDER — HEPARIN SODIUM (PORCINE) 1000 UNIT/ML IJ SOLN
INTRAMUSCULAR | Status: DC | PRN
  Administered 2018-03-30: 4000 [IU] via INTRAVENOUS
  Administered 2018-03-30: 1500 [IU] via INTRAVENOUS

## 2018-03-30 MED ORDER — HEPARIN (PORCINE) IN NACL 1000-0.9 UT/500ML-% IV SOLN
INTRAVENOUS | Status: DC | PRN
  Administered 2018-03-30: 500 mL

## 2018-03-30 MED ORDER — AMLODIPINE BESYLATE 10 MG PO TABS: 10.0000 mg | ORAL_TABLET | Freq: Every morning | ORAL | Status: DC

## 2018-03-30 MED ORDER — IPRATROPIUM-ALBUTEROL 0.5-2.5 (3) MG/3ML IN SOLN: 3.0000 mL | Freq: Four times a day (QID) | RESPIRATORY_TRACT | Status: DC | PRN

## 2018-03-30 MED ORDER — CLOPIDOGREL BISULFATE 300 MG PO TABS
ORAL_TABLET | ORAL | Status: AC
  Filled 2018-03-30: qty 1

## 2018-03-30 SURGICAL SUPPLY — 28 items
BALLN MUSTANG 7.0X40 75 (BALLOONS) ×3
BALLOON MUSTANG 7.0X40 75 (BALLOONS) ×2 IMPLANT
CATH ANGIO 5F PIGTAIL 65CM (CATHETERS) ×3 IMPLANT
CATH CROSS OVER TEMPO 5F (CATHETERS) ×3 IMPLANT
CATH OMNI FLUSH 5F 65CM (CATHETERS) ×3 IMPLANT
CATH TEMPO 5F RIM 65CM (CATHETERS) ×3 IMPLANT
CLOSURE MYNX CONTROL 6F/7F (Vascular Products) ×3 IMPLANT
DEVICE TORQUE .014-.018 (MISCELLANEOUS) ×2 IMPLANT
GUIDEWIRE ANGLED .035X150CM (WIRE) ×3 IMPLANT
KIT ENCORE 26 ADVANTAGE (KITS) ×3 IMPLANT
KIT MICROPUNCTURE NIT STIFF (SHEATH) ×3 IMPLANT
KIT PV (KITS) ×3 IMPLANT
SHEATH FLEXOR ANSEL 1 7F 45CM (SHEATH) ×3 IMPLANT
SHEATH PINNACLE 5F 10CM (SHEATH) ×3 IMPLANT
SHEATH PINNACLE 7F 10CM (SHEATH) ×3 IMPLANT
SHEATH PROBE COVER 6X72 (BAG) ×3 IMPLANT
STENT ELUVIA 7X60X130 (Permanent Stent) ×3 IMPLANT
STOPCOCK MORSE 400PSI 3WAY (MISCELLANEOUS) ×3 IMPLANT
SYRINGE MEDRAD AVANTA MACH 7 (SYRINGE) ×3 IMPLANT
TAPE VIPERTRACK RADIOPAQ (MISCELLANEOUS) ×2 IMPLANT
TAPE VIPERTRACK RADIOPAQUE (MISCELLANEOUS) ×1
TORQUE DEVICE .014-.018 (MISCELLANEOUS) ×3
TRANSDUCER W/STOPCOCK (MISCELLANEOUS) ×3 IMPLANT
TRAY PV CATH (CUSTOM PROCEDURE TRAY) ×3 IMPLANT
TUBING CIL FLEX 10 FLL-RA (TUBING) ×3 IMPLANT
WIRE HITORQ VERSACORE ST 145CM (WIRE) ×3 IMPLANT
WIRE TREASURE-12 .018X300CM (WIRE) ×3 IMPLANT
WIRE VERSACORE LOC 115CM (WIRE) ×3 IMPLANT

## 2018-03-30 NOTE — Interval H&P Note (Signed)
History and Physical Interval Note:  03/30/2018 7:35 AM  [name]  has presented today for surgery, with the diagnosis of critical limb ischemia  The various methods of treatment have been discussed with the patient and family. After consideration of risks, benefits and other options for treatment, the patient has consented to  Procedure(s): ABDOMINAL AORTOGRAM W/LOWER EXTREMITY (N/A) as a surgical intervention .  The patient's history has been reviewed, patient examined, no change in status, stable for surgery.  I have reviewed the patient's chart and labs.  Questions were answered to the patient's satisfaction.     [place]

## 2018-03-30 NOTE — Discharge Instructions (Signed)

## 2018-04-07 DIAGNOSIS — Z720 Tobacco use: Secondary | ICD-10-CM | POA: Diagnosis not present

## 2018-04-07 DIAGNOSIS — R0609 Other forms of dyspnea: Secondary | ICD-10-CM | POA: Diagnosis not present

## 2018-04-07 DIAGNOSIS — I998 Other disorder of circulatory system: Secondary | ICD-10-CM | POA: Diagnosis not present

## 2018-04-07 DIAGNOSIS — I10 Essential (primary) hypertension: Secondary | ICD-10-CM | POA: Diagnosis not present

## 2018-05-03 DIAGNOSIS — I739 Peripheral vascular disease, unspecified: Secondary | ICD-10-CM | POA: Diagnosis not present

## 2018-05-04 ENCOUNTER — Encounter (HOSPITAL_COMMUNITY): Payer: Self-pay | Admitting: Cardiology

## 2018-05-10 ENCOUNTER — Ambulatory Visit: Payer: Self-pay | Admitting: Neurology

## 2018-05-17 ENCOUNTER — Ambulatory Visit: Payer: Medicare Other | Admitting: Neurology

## 2018-05-18 DIAGNOSIS — J42 Unspecified chronic bronchitis: Secondary | ICD-10-CM | POA: Diagnosis not present

## 2018-05-18 DIAGNOSIS — R05 Cough: Secondary | ICD-10-CM | POA: Diagnosis not present

## 2018-05-18 DIAGNOSIS — J209 Acute bronchitis, unspecified: Secondary | ICD-10-CM | POA: Diagnosis not present

## 2018-06-13 ENCOUNTER — Emergency Department (HOSPITAL_COMMUNITY): Payer: Medicare Other

## 2018-06-13 ENCOUNTER — Inpatient Hospital Stay (HOSPITAL_COMMUNITY)
Admission: EM | Admit: 2018-06-13 | Discharge: 2018-07-12 | DRG: 335 | Disposition: A | Discharge status: Home Health | Payer: Medicare Other | Attending: General Surgery | Admitting: General Surgery

## 2018-06-13 ENCOUNTER — Encounter (HOSPITAL_COMMUNITY): Payer: Self-pay

## 2018-06-13 DIAGNOSIS — Z809 Family history of malignant neoplasm, unspecified: Secondary | ICD-10-CM

## 2018-06-13 DIAGNOSIS — R739 Hyperglycemia, unspecified: Secondary | ICD-10-CM | POA: Diagnosis not present

## 2018-06-13 DIAGNOSIS — E43 Unspecified severe protein-calorie malnutrition: Secondary | ICD-10-CM | POA: Diagnosis present

## 2018-06-13 DIAGNOSIS — G9341 Metabolic encephalopathy: Secondary | ICD-10-CM | POA: Diagnosis not present

## 2018-06-13 DIAGNOSIS — Z0189 Encounter for other specified special examinations: Secondary | ICD-10-CM

## 2018-06-13 DIAGNOSIS — F17210 Nicotine dependence, cigarettes, uncomplicated: Secondary | ICD-10-CM | POA: Diagnosis present

## 2018-06-13 DIAGNOSIS — K9189 Other postprocedural complications and disorders of digestive system: Secondary | ICD-10-CM | POA: Diagnosis not present

## 2018-06-13 DIAGNOSIS — D72819 Decreased white blood cell count, unspecified: Secondary | ICD-10-CM | POA: Diagnosis present

## 2018-06-13 DIAGNOSIS — Z9221 Personal history of antineoplastic chemotherapy: Secondary | ICD-10-CM

## 2018-06-13 DIAGNOSIS — R062 Wheezing: Secondary | ICD-10-CM

## 2018-06-13 DIAGNOSIS — D638 Anemia in other chronic diseases classified elsewhere: Secondary | ICD-10-CM | POA: Diagnosis not present

## 2018-06-13 DIAGNOSIS — Z9049 Acquired absence of other specified parts of digestive tract: Secondary | ICD-10-CM

## 2018-06-13 DIAGNOSIS — F419 Anxiety disorder, unspecified: Secondary | ICD-10-CM | POA: Diagnosis present

## 2018-06-13 DIAGNOSIS — E871 Hypo-osmolality and hyponatremia: Secondary | ICD-10-CM | POA: Diagnosis present

## 2018-06-13 DIAGNOSIS — F17200 Nicotine dependence, unspecified, uncomplicated: Secondary | ICD-10-CM | POA: Diagnosis not present

## 2018-06-13 DIAGNOSIS — J90 Pleural effusion, not elsewhere classified: Secondary | ICD-10-CM | POA: Diagnosis not present

## 2018-06-13 DIAGNOSIS — Z978 Presence of other specified devices: Secondary | ICD-10-CM

## 2018-06-13 DIAGNOSIS — J440 Chronic obstructive pulmonary disease with acute lower respiratory infection: Secondary | ICD-10-CM | POA: Diagnosis not present

## 2018-06-13 DIAGNOSIS — K449 Diaphragmatic hernia without obstruction or gangrene: Secondary | ICD-10-CM | POA: Diagnosis present

## 2018-06-13 DIAGNOSIS — Z955 Presence of coronary angioplasty implant and graft: Secondary | ICD-10-CM

## 2018-06-13 DIAGNOSIS — J449 Chronic obstructive pulmonary disease, unspecified: Secondary | ICD-10-CM | POA: Diagnosis not present

## 2018-06-13 DIAGNOSIS — I739 Peripheral vascular disease, unspecified: Secondary | ICD-10-CM | POA: Diagnosis present

## 2018-06-13 DIAGNOSIS — J9621 Acute and chronic respiratory failure with hypoxia: Secondary | ICD-10-CM | POA: Diagnosis not present

## 2018-06-13 DIAGNOSIS — G939 Disorder of brain, unspecified: Secondary | ICD-10-CM | POA: Diagnosis not present

## 2018-06-13 DIAGNOSIS — K861 Other chronic pancreatitis: Secondary | ICD-10-CM | POA: Diagnosis present

## 2018-06-13 DIAGNOSIS — J9811 Atelectasis: Secondary | ICD-10-CM | POA: Diagnosis not present

## 2018-06-13 DIAGNOSIS — K5669 Other partial intestinal obstruction: Secondary | ICD-10-CM | POA: Diagnosis not present

## 2018-06-13 DIAGNOSIS — L97529 Non-pressure chronic ulcer of other part of left foot with unspecified severity: Secondary | ICD-10-CM | POA: Diagnosis not present

## 2018-06-13 DIAGNOSIS — Z7902 Long term (current) use of antithrombotics/antiplatelets: Secondary | ICD-10-CM

## 2018-06-13 DIAGNOSIS — K4090 Unilateral inguinal hernia, without obstruction or gangrene, not specified as recurrent: Secondary | ICD-10-CM | POA: Diagnosis not present

## 2018-06-13 DIAGNOSIS — J9601 Acute respiratory failure with hypoxia: Secondary | ICD-10-CM

## 2018-06-13 DIAGNOSIS — I10 Essential (primary) hypertension: Secondary | ICD-10-CM | POA: Diagnosis not present

## 2018-06-13 DIAGNOSIS — J441 Chronic obstructive pulmonary disease with (acute) exacerbation: Secondary | ICD-10-CM | POA: Diagnosis present

## 2018-06-13 DIAGNOSIS — Z789 Other specified health status: Secondary | ICD-10-CM | POA: Diagnosis not present

## 2018-06-13 DIAGNOSIS — J431 Panlobular emphysema: Secondary | ICD-10-CM | POA: Diagnosis not present

## 2018-06-13 DIAGNOSIS — Z888 Allergy status to other drugs, medicaments and biological substances status: Secondary | ICD-10-CM

## 2018-06-13 DIAGNOSIS — R140 Abdominal distension (gaseous): Secondary | ICD-10-CM | POA: Diagnosis not present

## 2018-06-13 DIAGNOSIS — Z681 Body mass index (BMI) 19 or less, adult: Secondary | ICD-10-CM

## 2018-06-13 DIAGNOSIS — C3412 Malignant neoplasm of upper lobe, left bronchus or lung: Secondary | ICD-10-CM | POA: Diagnosis not present

## 2018-06-13 DIAGNOSIS — K4030 Unilateral inguinal hernia, with obstruction, without gangrene, not specified as recurrent: Secondary | ICD-10-CM | POA: Diagnosis present

## 2018-06-13 DIAGNOSIS — I2510 Atherosclerotic heart disease of native coronary artery without angina pectoris: Secondary | ICD-10-CM | POA: Diagnosis present

## 2018-06-13 DIAGNOSIS — Z4682 Encounter for fitting and adjustment of non-vascular catheter: Secondary | ICD-10-CM | POA: Diagnosis not present

## 2018-06-13 DIAGNOSIS — K4031 Unilateral inguinal hernia, with obstruction, without gangrene, recurrent: Principal | ICD-10-CM | POA: Diagnosis present

## 2018-06-13 DIAGNOSIS — R64 Cachexia: Secondary | ICD-10-CM | POA: Diagnosis present

## 2018-06-13 DIAGNOSIS — J438 Other emphysema: Secondary | ICD-10-CM | POA: Diagnosis not present

## 2018-06-13 DIAGNOSIS — I708 Atherosclerosis of other arteries: Secondary | ICD-10-CM | POA: Diagnosis not present

## 2018-06-13 DIAGNOSIS — Z85118 Personal history of other malignant neoplasm of bronchus and lung: Secondary | ICD-10-CM

## 2018-06-13 DIAGNOSIS — Z95828 Presence of other vascular implants and grafts: Secondary | ICD-10-CM

## 2018-06-13 DIAGNOSIS — E878 Other disorders of electrolyte and fluid balance, not elsewhere classified: Secondary | ICD-10-CM | POA: Diagnosis not present

## 2018-06-13 DIAGNOSIS — J9690 Respiratory failure, unspecified, unspecified whether with hypoxia or hypercapnia: Secondary | ICD-10-CM

## 2018-06-13 DIAGNOSIS — R0602 Shortness of breath: Secondary | ICD-10-CM

## 2018-06-13 DIAGNOSIS — J439 Emphysema, unspecified: Secondary | ICD-10-CM | POA: Diagnosis not present

## 2018-06-13 DIAGNOSIS — J189 Pneumonia, unspecified organism: Secondary | ICD-10-CM | POA: Diagnosis not present

## 2018-06-13 DIAGNOSIS — K648 Other hemorrhoids: Secondary | ICD-10-CM | POA: Diagnosis present

## 2018-06-13 DIAGNOSIS — E876 Hypokalemia: Secondary | ICD-10-CM | POA: Diagnosis not present

## 2018-06-13 DIAGNOSIS — I272 Pulmonary hypertension, unspecified: Secondary | ICD-10-CM | POA: Diagnosis present

## 2018-06-13 DIAGNOSIS — Z87891 Personal history of nicotine dependence: Secondary | ICD-10-CM | POA: Diagnosis present

## 2018-06-13 DIAGNOSIS — Z825 Family history of asthma and other chronic lower respiratory diseases: Secondary | ICD-10-CM

## 2018-06-13 DIAGNOSIS — Z79899 Other long term (current) drug therapy: Secondary | ICD-10-CM

## 2018-06-13 DIAGNOSIS — K4091 Unilateral inguinal hernia, without obstruction or gangrene, recurrent: Secondary | ICD-10-CM | POA: Diagnosis not present

## 2018-06-13 DIAGNOSIS — Z923 Personal history of irradiation: Secondary | ICD-10-CM

## 2018-06-13 DIAGNOSIS — Z4659 Encounter for fitting and adjustment of other gastrointestinal appliance and device: Secondary | ICD-10-CM

## 2018-06-13 DIAGNOSIS — K56609 Unspecified intestinal obstruction, unspecified as to partial versus complete obstruction: Secondary | ICD-10-CM

## 2018-06-13 DIAGNOSIS — F1010 Alcohol abuse, uncomplicated: Secondary | ICD-10-CM | POA: Diagnosis present

## 2018-06-13 DIAGNOSIS — N990 Postprocedural (acute) (chronic) kidney failure: Secondary | ICD-10-CM | POA: Diagnosis not present

## 2018-06-13 DIAGNOSIS — I959 Hypotension, unspecified: Secondary | ICD-10-CM | POA: Diagnosis not present

## 2018-06-13 DIAGNOSIS — W1830XA Fall on same level, unspecified, initial encounter: Secondary | ICD-10-CM | POA: Diagnosis not present

## 2018-06-13 DIAGNOSIS — R0902 Hypoxemia: Secondary | ICD-10-CM

## 2018-06-13 LAB — COMPREHENSIVE METABOLIC PANEL
ALBUMIN: 4 g/dL (ref 3.5–5.0)
ALT: 18 U/L (ref 0–44)
AST: 36 U/L (ref 15–41)
Alkaline Phosphatase: 98 U/L (ref 38–126)
Anion gap: 13 (ref 5–15)
BUN: 15 mg/dL (ref 8–23)
CO2: 31 mmol/L (ref 22–32)
CREATININE: 0.82 mg/dL (ref 0.61–1.24)
Calcium: 10.2 mg/dL (ref 8.9–10.3)
Chloride: 89 mmol/L — ABNORMAL LOW (ref 98–111)
GFR calc Af Amer: >60 mL/min (ref >60)
GFR calc non Af Amer: >60 mL/min (ref >60)
GLUCOSE: 167 mg/dL — AB (ref 70–99)
Potassium: 4 mmol/L (ref 3.5–5.1)
SODIUM: 133 mmol/L — AB (ref 135–145)
Total Bilirubin: 0.4 mg/dL (ref 0.3–1.2)
Total Protein: 8.1 g/dL (ref 6.5–8.1)

## 2018-06-13 LAB — CBC
HEMATOCRIT: 40.9 % (ref 39.0–52.0)
Hemoglobin: 14.1 g/dL (ref 13.0–17.0)
MCH: 32.9 pg (ref 26.0–34.0)
MCHC: 34.5 g/dL (ref 30.0–36.0)
MCV: 95.3 fL (ref 80.0–100.0)
NRBC: 0 % (ref 0.0–0.2)
Platelets: 241 10*3/uL (ref 150–400)
RBC: 4.29 MIL/uL (ref 4.22–5.81)
RDW: 14 % (ref 11.5–15.5)
WBC: 8.1 10*3/uL (ref 4.0–10.5)

## 2018-06-13 LAB — LIPASE, BLOOD: Lipase: 53 U/L — ABNORMAL HIGH (ref 11–51)

## 2018-06-13 MED ORDER — FENTANYL CITRATE (PF) 100 MCG/2ML IJ SOLN
25.0000 ug | Freq: Once | INTRAMUSCULAR | Status: AC
  Administered 2018-06-13: 25 ug via INTRAVENOUS
  Filled 2018-06-13: qty 2

## 2018-06-13 MED ORDER — SODIUM CHLORIDE (PF) 0.9 % IJ SOLN
INTRAMUSCULAR | Status: AC
  Filled 2018-06-13: qty 50

## 2018-06-13 MED ORDER — ONDANSETRON HCL 4 MG/2ML IJ SOLN
4.0000 mg | Freq: Once | INTRAMUSCULAR | Status: AC
  Administered 2018-06-13: 4 mg via INTRAVENOUS
  Filled 2018-06-13: qty 2

## 2018-06-13 MED ORDER — HYDROMORPHONE HCL 1 MG/ML IJ SOLN
1.0000 mg | Freq: Once | INTRAMUSCULAR | Status: AC
  Administered 2018-06-14: 1 mg via INTRAVENOUS
  Filled 2018-06-13: qty 1

## 2018-06-13 MED ORDER — IOPAMIDOL (ISOVUE-300) INJECTION 61%
INTRAVENOUS | Status: AC
  Filled 2018-06-13: qty 100

## 2018-06-13 MED ORDER — IOPAMIDOL (ISOVUE-300) INJECTION 61%
100.0000 mL | Freq: Once | INTRAVENOUS | Status: AC | PRN
  Administered 2018-06-13: 100 mL via INTRAVENOUS

## 2018-06-13 MED ORDER — SODIUM CHLORIDE 0.9 % IV BOLUS
1000.0000 mL | Freq: Once | INTRAVENOUS | Status: AC
  Administered 2018-06-13: 1000 mL via INTRAVENOUS

## 2018-06-13 MED ORDER — LIDOCAINE HCL URETHRAL/MUCOSAL 2 % EX GEL
1.0000 "application " | Freq: Once | CUTANEOUS | Status: AC
  Administered 2018-06-14: 1
  Filled 2018-06-13: qty 5

## 2018-06-13 NOTE — ED Notes (Signed)
Patient is aware that urine sample is needed. He stated that he had not peed since last night.

## 2018-06-13 NOTE — H&P (Signed)
[name] is an 68 y.o. male.   Chief Complaint: N&V HPI: Pt presents with nausea, vomiting, p.o. intolerance.  This started yesterday evening.  He associates this with recent increase in coughing. Since that time he has been progressively more sick, with severe sharp pain throughout his abdomen.  Patient smokes, drinks, has a history of COPD, and prior abdominal surgery with colostomy, now revered at outside hospital.  Current Meds  Medication Sig  . amLODipine (NORVASC) 10 MG tablet Take 10 mg by mouth every morning.   Marland Kitchen buPROPion (WELLBUTRIN XL) 150 MG 24 hr tablet Take 150 mg by mouth every morning.   . clopidogrel (PLAVIX) 75 MG tablet Take 1 tablet (75 mg total) by mouth daily.  Marland Kitchen dextromethorphan-guaiFENesin (MUCINEX DM) 30-600 MG 12hr tablet Take 1-2 tablets by mouth 2 (two) times daily as needed for cough.  Marland Kitchen ipratropium-albuterol (DUONEB) 0.5-2.5 (3) MG/3ML SOLN Take 3 mLs by nebulization every 6 (six) hours as needed (shortness of breath and or wheezing).  . metoprolol succinate (TOPROL-XL) 50 MG 24 hr tablet Take 1 tablet (50 mg total) by mouth every morning.  . Multiple Vitamin (MULTIVITAMIN WITH MINERALS) TABS tablet Take 1 tablet by mouth every morning.  . Nutritional Supplements (CARNATION BREAKFAST ESSENTIALS PO) Take 1 Container by mouth 2 (two) times daily.   Marland Kitchen PROAIR HFA 108 (90 BASE) MCG/ACT inhaler Inhale 1 puff into the lungs every 4 (four) hours as needed for wheezing or shortness of breath.   . triamcinolone (KENALOG) 0.025 % cream Apply 1 application topically 2 (two) times daily. Apply to face  . triamcinolone ointment (KENALOG) 0.1 % Apply 1 application topically 2 (two) times daily. Apply to legs and ears   Past Medical History:  Diagnosis Date  . COPD (chronic obstructive pulmonary disease) ([place])   . Hypertension   . Iliac artery stenosis, right ([facility])    80% Stenosis  . 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[place])   . Lower limb ischemia 03/26/2018  . Lung cancer  dx'd 12/2014  . Pancreatitis    x2 stents placed to make patent duct to pancreas  . Pulmonary hypertension ([place])   . Severe claudication ([place])   . Status post left foot surgery   . Tobacco abuse     Past Surgical History:  Procedure Laterality Date  . ABDOMINAL AORTOGRAM W/LOWER EXTREMITY N/A 03/30/2018   Procedure: ABDOMINAL AORTOGRAM W/LOWER EXTREMITY;  Surgeon: Nigel Mormon, MD;  Location: [facility];  Service: Cardiovascular;  Laterality: N/A;  . COLECTOMY    . COLOSTOMY    . COLOSTOMY REVERSAL    . EUS N/A 12/14/2014   Procedure: UPPER ENDOSCOPIC ULTRASOUND (EUS) LINEAR;  Surgeon: Milus Banister, MD;  Location: [facility];  Service: Endoscopy;  Laterality: N/A;  . FOOT SURGERY Left 2006  . PANCREAS SURGERY  2007  . PERIPHERAL VASCULAR INTERVENTION  03/30/2018   Procedure: PERIPHERAL VASCULAR INTERVENTION;  Surgeon: Nigel Mormon, MD;  Location: [facility];  Service: Cardiovascular;;  Rt Iliac  . UPPER GI ENDOSCOPY    . VENTRAL HERNIA REPAIR  1967    Family History  Problem Relation Age of Onset  . COPD Mother   . Diabetes Father   . Cancer Brother        metastatic, unknown primary  . Cancer Paternal Grandmother    Social History:  reports that he has been smoking cigarettes. He has a 25.00 pack-year smoking history. He has never used smokeless tobacco. He reports current alcohol  use. He reports that he does not use drugs.  Allergies:  Allergies  Allergen Reactions  . Chantix [Varenicline] Other (See Comments)    Physically ill  . Prevacid [Lansoprazole] Other (See Comments)    "swell up with gas"    (Not in a hospital admission)   Results for orders placed or performed during the hospital encounter of 06/13/18 (from the past 48 hour(s))  Lipase, blood     Status: Abnormal   Collection Time: 06/13/18  7:24 PM  Result Value Ref Range   Lipase 53 (H) 11 - 51 U/L    Comment: Performed at [facility], [place]  [address], [place], [place] [address]  Comprehensive metabolic panel     Status: Abnormal   Collection Time: 06/13/18  7:24 PM  Result Value Ref Range   Sodium 133 (L) 135 - 145 mmol/L   Potassium 4.0 3.5 - 5.1 mmol/L   Chloride 89 (L) 98 - 111 mmol/L   CO2 31 22 - 32 mmol/L   Glucose, Bld 167 (H) 70 - 99 mg/dL   BUN 15 8 - 23 mg/dL   Creatinine, Ser 0.82 0.61 - 1.24 mg/dL   Calcium 10.2 8.9 - 10.3 mg/dL   Total Protein 8.1 6.5 - 8.1 g/dL   Albumin 4.0 3.5 - 5.0 g/dL   AST 36 15 - 41 U/L   ALT 18 0 - 44 U/L   Alkaline Phosphatase 98 38 - 126 U/L   Total Bilirubin 0.4 0.3 - 1.2 mg/dL   GFR calc non Af Amer >60 >60 mL/min   GFR calc Af Amer >60 >60 mL/min   Anion gap 13 5 - 15    Comment: Performed at [facility], [place] [address], [place], [place] [address]  CBC     Status: None   Collection Time: 06/13/18  7:24 PM  Result Value Ref Range   WBC 8.1 4.0 - 10.5 K/uL   RBC 4.29 4.22 - 5.81 MIL/uL   Hemoglobin 14.1 13.0 - 17.0 g/dL   HCT 40.9 39.0 - 52.0 %   MCV 95.3 80.0 - 100.0 fL   MCH 32.9 26.0 - 34.0 pg   MCHC 34.5 30.0 - 36.0 g/dL   RDW 14.0 11.5 - 15.5 %   Platelets 241 150 - 400 K/uL   nRBC 0.0 0.0 - 0.2 %    Comment: Performed at [facility], [place] [address], [place], [place] [address]   Ct Abdomen Pelvis W Contrast  Result Date: 06/13/2018 CLINICAL DATA:  Nausea, vomiting, bowel obstruction, history COPD, hypertension, vascular disease, pulmonary hypertension, smoker, pancreatitis, lung cancer EXAM: CT ABDOMEN AND PELVIS WITH CONTRAST TECHNIQUE: Multidetector CT imaging of the abdomen and pelvis was performed using the standard protocol following bolus administration of intravenous contrast. Sagittal and coronal MPR images reconstructed from axial data set. CONTRAST:  117mL ISOVUE-300 IOPAMIDOL (ISOVUE-300) INJECTION 61% IV. No oral contrast administered. COMPARISON:  04/08/2016 Correlation: PET-CT 10/19/2017 FINDINGS: Lower chest: Lung  bases appear emphysematous with a calcified granuloma at RIGHT lung base. Hepatobiliary: Gallbladder and liver normal appearance Pancreas: He atrophic pancreas with numerous parenchymal calcifications and minimal ductal dilatation consistent with chronic calcific pancreatitis. No definite pancreatic mass. Spleen: Small spleen without focal mass Adrenals/Urinary Tract: Tiny RIGHT renal cyst. Adrenal glands, kidneys, ureters and bladder otherwise unremarkable. Stomach/Bowel: Colon decompressed. Appendix not visualized. Distended stomach with small hiatal hernia. Numerous dilated proximal and mid small bowel loops with decompressed distal small bowel loops consistent with  small bowel obstruction. Transition point is at a small bowel loop located within a LEFT inguinal hernia. Additional fat within LEFT inguinal hernia. RIGHT inguinal hernia also present, containing ascites but no bowel. No definite bowel wall thickening. Vascular/Lymphatic: Extensive atherosclerotic calcifications of aorta, visceral arteries, iliac arteries. Aorta normal caliber. Stents identified at the RIGHT common and external iliac arteries. No adenopathy. Reproductive: Small cyst at posterior aspect of prostate gland unchanged. Other: Scattered ascites.  No free air. Musculoskeletal: Bones demineralized. IMPRESSION: Mid to distal small bowel obstruction secondary to an obstructed small bowel loop within a LEFT inguinal hernia. RIGHT inguinal hernia containing ascites but no bowel loops. Chronic calcific pancreatitis. Extensive atherosclerotic disease. Small hiatal hernia. Electronically Signed   By: Lavonia Dana M.D.   On: 06/13/2018 22:56    Review of Systems  Constitutional: Negative for chills and fever.  HENT: Negative for hearing loss.   Eyes: Negative for blurred vision.  Respiratory: Negative for cough and shortness of breath.   Cardiovascular: Negative for chest pain and palpitations.  Gastrointestinal: Positive for nausea and  vomiting.  Genitourinary: Negative for dysuria and urgency.  Skin: Negative for itching and rash.  Neurological: Negative for dizziness and headaches.    Blood pressure (!) 164/88, pulse 81, temperature 97.9 F (36.6 C), temperature source Oral, resp. rate 18, height 5\' 2"  (1.575 m), weight 39.9 kg, SpO2 94 %. Physical Exam  Constitutional: He is oriented to person, place, and time. He appears well-developed and well-nourished.  HENT:  Head: Normocephalic and atraumatic.  Eyes: Pupils are equal, round, and reactive to light. Conjunctivae are normal.  Neck: Normal range of motion. Neck supple.  Cardiovascular: Normal rate and regular rhythm.  Respiratory: Effort normal and breath sounds normal.  GI: Soft. He exhibits mass. There is abdominal tenderness (LLQ hernia). There is no rebound and no guarding.  Reducible R ing hernia  Musculoskeletal: Normal range of motion.  Neurological: He is alert and oriented to person, place, and time.  Skin: Skin is warm and dry.     Assessment/Plan 68 y.o. M on plavix with SBO due to hernia.  I do not believe the bowel is strangulated.  Given he is on plavix, I think the risks of surgery are too high for intervention currently.  Will place NG and try to decompress small bowel.    NPO IVF's COPD: cont home nebs HTN: cont norvasc and metoprolol  Rosario Adie, MD 0/02/8118, 11:37 PM

## 2018-06-13 NOTE — ED Provider Notes (Addendum)
[facility] DEPT Provider Note   CSN: [id number] Arrival date & time: 06/13/18  1811     History   Chief Complaint Chief Complaint  Patient presents with  . Abdominal Pain    HPI [name] is a 68 y.o. male.  HPI  Presents with some nausea, vomiting, p.o. intolerance. Onset was yesterday about 27 hours ago. Since that time he has been progressively more sick, with severe sharp pain throughout his abdomen. No relief with anything Last bowel movement was possibly today, though with minimal output. Patient smokes, drinks, has a history of COPD, and prior abdominal surgery with colostomy, now revised.  Past Medical History:  Diagnosis Date  . COPD (chronic obstructive pulmonary disease) ([place])   . Hypertension   . Iliac artery stenosis, right (HCC)    80% Stenosis  . 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[place])   . Lower limb ischemia 03/26/2018  . Lung cancer dx'd 12/2014  . Pancreatitis    x2 stents placed to make patent duct to pancreas  . Pulmonary hypertension ([place])   . Severe claudication ([place])   . Status post left foot surgery   . Tobacco abuse     Patient Active Problem List   Diagnosis Date Noted  . Severe claudication ([place]) 03/28/2018  . Primary cancer of right upper lobe of lung ([place]) 11/30/2017  . Hyponatremia 01/05/2015  . Hypokalemia 01/05/2015  . COPD (chronic obstructive pulmonary disease) ([place]) 01/05/2015  . Essential hypertension 01/05/2015  . Protein-calorie malnutrition, severe ([place]) 01/05/2015  . Pneumothorax of left lung after biopsy 01/04/2015  . Smoker 01/04/2015  . Pneumothorax, traumatic   . Cancer of upper lobe of left lung ([place]) 12/21/2014  . Pancreatic mass 11/23/2014    Past Surgical History:  Procedure Laterality Date  . ABDOMINAL AORTOGRAM W/LOWER EXTREMITY N/A 03/30/2018   Procedure: ABDOMINAL AORTOGRAM W/LOWER EXTREMITY;  Surgeon: Nigel Mormon, MD;  Location: [facility];  Service:  Cardiovascular;  Laterality: N/A;  . COLECTOMY    . COLOSTOMY    . COLOSTOMY REVERSAL    . EUS N/A 12/14/2014   Procedure: UPPER ENDOSCOPIC ULTRASOUND (EUS) LINEAR;  Surgeon: Milus Banister, MD;  Location: [facility];  Service: Endoscopy;  Laterality: N/A;  . FOOT SURGERY Left 2006  . PANCREAS SURGERY  2007  . PERIPHERAL VASCULAR INTERVENTION  03/30/2018   Procedure: PERIPHERAL VASCULAR INTERVENTION;  Surgeon: Nigel Mormon, MD;  Location: [facility];  Service: Cardiovascular;;  Rt Iliac  . UPPER GI ENDOSCOPY    . [place] Medications    Prior to Admission medications   Medication Sig Start Date End Date Taking? Authorizing Provider  amLODipine (NORVASC) 10 MG tablet Take 10 mg by mouth every morning.  11/07/14  Yes [provider]  buPROPion (WELLBUTRIN XL) 150 MG 24 hr tablet Take 150 mg by mouth every morning.  10/30/14  Yes [provider]  clopidogrel (PLAVIX) 75 MG tablet Take 1 tablet (75 mg total) by mouth daily. 03/30/18 03/30/19 Yes Patwardhan, Manish J, MD  dextromethorphan-guaiFENesin (MUCINEX DM) 30-600 MG 12hr tablet Take 1-2 tablets by mouth 2 (two) times daily as needed for cough.   Yes [provider]  ipratropium-albuterol (DUONEB) 0.5-2.5 (3) MG/3ML SOLN Take 3 mLs by nebulization every 6 (six) hours as needed (shortness of breath and or wheezing).   Yes [provider]  metoprolol succinate (TOPROL-XL) 50 MG 24 hr tablet Take  1 tablet (50 mg total) by mouth every morning. 03/30/18  Yes Patwardhan, Reynold Bowen, MD  Multiple Vitamin (MULTIVITAMIN WITH MINERALS) TABS tablet Take 1 tablet by mouth every morning.   Yes [provider]  Nutritional Supplements (CARNATION BREAKFAST ESSENTIALS PO) Take 1 Container by mouth 2 (two) times daily.    Yes [provider]  PROAIR HFA 108 (90 BASE) MCG/ACT inhaler Inhale 1 puff into the lungs every 4 (four) hours as needed for wheezing or  shortness of breath.  10/16/14  Yes [provider]  triamcinolone (KENALOG) 0.025 % cream Apply 1 application topically 2 (two) times daily. Apply to face 03/15/18  Yes [provider]  triamcinolone ointment (KENALOG) 0.1 % Apply 1 application topically 2 (two) times daily. Apply to legs and ears 03/16/18  Yes [provider]    Family History Family History  Problem Relation Age of Onset  . COPD Mother   . Diabetes Father   . Cancer Brother        metastatic, unknown primary  . Cancer Paternal Grandmother     Social History Social History   Tobacco Use  . Smoking status: Current Every Day Smoker    Packs/day: 0.50    Years: 50.00    Pack years: 25.00    Types: Cigarettes  . Smokeless tobacco: Never Used  . Tobacco comment: currently 0.5 ppd, trying to quit  Substance Use Topics  . Alcohol use: Yes    Comment: 6 pack of beer per day  . Drug use: No     Allergies   Chantix [varenicline] and Prevacid [lansoprazole]   Review of Systems Review of Systems  Constitutional:       Per HPI, otherwise negative  HENT:       Per HPI, otherwise negative  Respiratory:       Per HPI, otherwise negative  Cardiovascular:       Per HPI, otherwise negative  Gastrointestinal: Positive for abdominal pain, nausea and vomiting.  Endocrine:       Negative aside from HPI  Genitourinary:       Neg aside from HPI   Musculoskeletal:       Per HPI, otherwise negative  Skin: Negative.   Neurological: Negative for syncope.     Physical Exam Updated Vital Signs BP (!) 164/88 (BP Location: Left Arm)   Pulse 81   Temp 97.9 F (36.6 C) (Oral)   Resp 18   Ht 5\' 2"  (1.575 m)   Wt 39.9 kg   SpO2 94%   BMI 16.10 kg/m   Physical Exam Vitals signs and nursing note reviewed.  Constitutional:      General: He is not in acute distress.    Appearance: He is well-developed. He is ill-appearing.  HENT:     Head: Normocephalic and atraumatic.  Eyes:      Conjunctiva/sclera: Conjunctivae normal.  Cardiovascular:     Rate and Rhythm: Normal rate and regular rhythm.  Pulmonary:     Effort: Pulmonary effort is normal. No respiratory distress.     Breath sounds: No stridor.  Abdominal:     General: There is no distension.     Tenderness: There is generalized abdominal tenderness. There is guarding.  Genitourinary:   Skin:    General: Skin is warm and dry.  Neurological:     Mental Status: He is alert and oriented to person, place, and time.      ED Treatments / Results  Labs (all labs  ordered are listed, but only abnormal results are displayed) Labs Reviewed  LIPASE, BLOOD - Abnormal; Notable for the following components:      Result Value   Lipase 53 (*)    All other components within normal limits  COMPREHENSIVE METABOLIC PANEL - Abnormal; Notable for the following components:   Sodium 133 (*)    Chloride 89 (*)    Glucose, Bld 167 (*)    All other components within normal limits  CBC    Radiology Ct Abdomen Pelvis W Contrast  Result Date: 06/13/2018 CLINICAL DATA:  Nausea, vomiting, bowel obstruction, history COPD, hypertension, vascular disease, pulmonary hypertension, smoker, pancreatitis, lung cancer EXAM: CT ABDOMEN AND PELVIS WITH CONTRAST TECHNIQUE: Multidetector CT imaging of the abdomen and pelvis was performed using the standard protocol following bolus administration of intravenous contrast. Sagittal and coronal MPR images reconstructed from axial data set. CONTRAST:  183mL ISOVUE-300 IOPAMIDOL (ISOVUE-300) INJECTION 61% IV. No oral contrast administered. COMPARISON:  04/08/2016 Correlation: PET-CT 10/19/2017 FINDINGS: Lower chest: Lung bases appear emphysematous with a calcified granuloma at RIGHT lung base. Hepatobiliary: Gallbladder and liver normal appearance Pancreas: He atrophic pancreas with numerous parenchymal calcifications and minimal ductal dilatation consistent with chronic calcific pancreatitis. No definite  pancreatic mass. Spleen: Small spleen without focal mass Adrenals/Urinary Tract: Tiny RIGHT renal cyst. Adrenal glands, kidneys, ureters and bladder otherwise unremarkable. Stomach/Bowel: Colon decompressed. Appendix not visualized. Distended stomach with small hiatal hernia. Numerous dilated proximal and mid small bowel loops with decompressed distal small bowel loops consistent with small bowel obstruction. Transition point is at a small bowel loop located within a LEFT inguinal hernia. Additional fat within LEFT inguinal hernia. RIGHT inguinal hernia also present, containing ascites but no bowel. No definite bowel wall thickening. Vascular/Lymphatic: Extensive atherosclerotic calcifications of aorta, visceral arteries, iliac arteries. Aorta normal caliber. Stents identified at the RIGHT common and external iliac arteries. No adenopathy. Reproductive: Small cyst at posterior aspect of prostate gland unchanged. Other: Scattered ascites.  No free air. Musculoskeletal: Bones demineralized. IMPRESSION: Mid to distal small bowel obstruction secondary to an obstructed small bowel loop within a LEFT inguinal hernia. RIGHT inguinal hernia containing ascites but no bowel loops. Chronic calcific pancreatitis. Extensive atherosclerotic disease. Small hiatal hernia. Electronically Signed   By: Lavonia Dana M.D.   On: 06/13/2018 22:56    Procedures Procedures (including critical care time)  Medications Ordered in ED Medications  iopamidol (ISOVUE-300) 61 % injection (has no administration in time range)  sodium chloride (PF) 0.9 % injection (has no administration in time range)  HYDROmorphone (DILAUDID) injection 1 mg (has no administration in time range)  sodium chloride 0.9 % bolus 1,000 mL (1,000 mLs Intravenous New Bag/Given 06/13/18 2109)  ondansetron (ZOFRAN) injection 4 mg (4 mg Intravenous Given 06/13/18 2108)  fentaNYL (SUBLIMAZE) injection 25 mcg (25 mcg Intravenous Given 06/13/18 2109)  iopamidol (ISOVUE-300)  61 % injection 100 mL (100 mLs Intravenous Contrast Given 06/13/18 2240)     Initial Impression / Assessment and Plan / ED Course  I have reviewed the triage vital signs and the nursing notes.  Pertinent labs & imaging results that were available during my care of the patient were reviewed by me and considered in my medical decision making (see chart for details).     Patient continues to have pain.  11:17 PM I reviewed the patient's CT scan, discussed with him, official interpretation consistent with findings of bowel obstruction, incarcerated left inguinal hernia. Patient has received additional Dilaudid for pain control, remains awake  and alert, hemodynamically unremarkable. With concern for incarcerated hernia with bowel obstruction, the patient will have NG tube placed.  Attempt to reduce the hernia in the emergency department were unsuccessful.  Patient will require admission to our surgical colleagues for further evaluation and management.  Final Clinical Impressions(s) / ED Diagnoses   Final diagnoses:  Small bowel obstruction [facility])     Carmin Muskrat, MD 06/13/18 2318  CRITICAL CARE Performed by: Carmin Muskrat Total critical care time: 35 minutes Critical care time was exclusive of separately billable procedures and treating other patients. Critical care was necessary to treat or prevent imminent or life-threatening deterioration. Critical care was time spent personally by me on the following activities: development of treatment plan with patient and/or surrogate as well as nursing, discussions with consultants, evaluation of patient's response to treatment, examination of patient, obtaining history from patient or surrogate, ordering and performing treatments and interventions, ordering and review of laboratory studies, ordering and review of radiographic studies, pulse oximetry and re-evaluation of patient's condition.     Carmin Muskrat, MD 06/13/18 [phone]

## 2018-06-13 NOTE — ED Notes (Signed)
Attempted PIV placement into the LLFA. Vein blew with catheter advancement. Charge RN to attempt PIV.

## 2018-06-13 NOTE — ED Triage Notes (Signed)
Pt presents with c/o abdominal pain and vomiting since eating dinner last night. Pt reports some diarrhea last night but none today.

## 2018-06-14 ENCOUNTER — Emergency Department (HOSPITAL_COMMUNITY): Payer: Medicare Other

## 2018-06-14 ENCOUNTER — Other Ambulatory Visit: Payer: Self-pay

## 2018-06-14 ENCOUNTER — Encounter: Payer: Medicare Other | Admitting: Surgery

## 2018-06-14 DIAGNOSIS — K861 Other chronic pancreatitis: Secondary | ICD-10-CM | POA: Diagnosis present

## 2018-06-14 DIAGNOSIS — K5651 Intestinal adhesions [bands], with partial obstruction: Secondary | ICD-10-CM | POA: Diagnosis not present

## 2018-06-14 DIAGNOSIS — I10 Essential (primary) hypertension: Secondary | ICD-10-CM | POA: Diagnosis not present

## 2018-06-14 DIAGNOSIS — J9690 Respiratory failure, unspecified, unspecified whether with hypoxia or hypercapnia: Secondary | ICD-10-CM | POA: Diagnosis not present

## 2018-06-14 DIAGNOSIS — J9811 Atelectasis: Secondary | ICD-10-CM | POA: Diagnosis not present

## 2018-06-14 DIAGNOSIS — K4030 Unilateral inguinal hernia, with obstruction, without gangrene, not specified as recurrent: Secondary | ICD-10-CM | POA: Diagnosis present

## 2018-06-14 DIAGNOSIS — K449 Diaphragmatic hernia without obstruction or gangrene: Secondary | ICD-10-CM | POA: Diagnosis not present

## 2018-06-14 DIAGNOSIS — W1830XA Fall on same level, unspecified, initial encounter: Secondary | ICD-10-CM | POA: Diagnosis not present

## 2018-06-14 DIAGNOSIS — E871 Hypo-osmolality and hyponatremia: Secondary | ICD-10-CM | POA: Diagnosis present

## 2018-06-14 DIAGNOSIS — K4091 Unilateral inguinal hernia, without obstruction or gangrene, recurrent: Secondary | ICD-10-CM | POA: Diagnosis not present

## 2018-06-14 DIAGNOSIS — I272 Pulmonary hypertension, unspecified: Secondary | ICD-10-CM | POA: Diagnosis present

## 2018-06-14 DIAGNOSIS — J440 Chronic obstructive pulmonary disease with acute lower respiratory infection: Secondary | ICD-10-CM | POA: Diagnosis not present

## 2018-06-14 DIAGNOSIS — R0902 Hypoxemia: Secondary | ICD-10-CM | POA: Diagnosis not present

## 2018-06-14 DIAGNOSIS — K56699 Other intestinal obstruction unspecified as to partial versus complete obstruction: Secondary | ICD-10-CM | POA: Diagnosis not present

## 2018-06-14 DIAGNOSIS — C3412 Malignant neoplasm of upper lobe, left bronchus or lung: Secondary | ICD-10-CM | POA: Diagnosis present

## 2018-06-14 DIAGNOSIS — D638 Anemia in other chronic diseases classified elsewhere: Secondary | ICD-10-CM | POA: Diagnosis present

## 2018-06-14 DIAGNOSIS — Z4682 Encounter for fitting and adjustment of non-vascular catheter: Secondary | ICD-10-CM | POA: Diagnosis not present

## 2018-06-14 DIAGNOSIS — D72819 Decreased white blood cell count, unspecified: Secondary | ICD-10-CM | POA: Diagnosis present

## 2018-06-14 DIAGNOSIS — K565 Intestinal adhesions [bands], unspecified as to partial versus complete obstruction: Secondary | ICD-10-CM | POA: Diagnosis not present

## 2018-06-14 DIAGNOSIS — I708 Atherosclerosis of other arteries: Secondary | ICD-10-CM | POA: Diagnosis present

## 2018-06-14 DIAGNOSIS — R5381 Other malaise: Secondary | ICD-10-CM | POA: Diagnosis not present

## 2018-06-14 DIAGNOSIS — R0602 Shortness of breath: Secondary | ICD-10-CM | POA: Diagnosis not present

## 2018-06-14 DIAGNOSIS — K56609 Unspecified intestinal obstruction, unspecified as to partial versus complete obstruction: Secondary | ICD-10-CM | POA: Diagnosis not present

## 2018-06-14 DIAGNOSIS — I739 Peripheral vascular disease, unspecified: Secondary | ICD-10-CM | POA: Diagnosis not present

## 2018-06-14 DIAGNOSIS — G9341 Metabolic encephalopathy: Secondary | ICD-10-CM | POA: Diagnosis not present

## 2018-06-14 DIAGNOSIS — J189 Pneumonia, unspecified organism: Secondary | ICD-10-CM | POA: Diagnosis not present

## 2018-06-14 DIAGNOSIS — F17210 Nicotine dependence, cigarettes, uncomplicated: Secondary | ICD-10-CM | POA: Diagnosis present

## 2018-06-14 DIAGNOSIS — E43 Unspecified severe protein-calorie malnutrition: Secondary | ICD-10-CM

## 2018-06-14 DIAGNOSIS — K4031 Unilateral inguinal hernia, with obstruction, without gangrene, recurrent: Secondary | ICD-10-CM | POA: Diagnosis not present

## 2018-06-14 DIAGNOSIS — G939 Disorder of brain, unspecified: Secondary | ICD-10-CM | POA: Diagnosis present

## 2018-06-14 DIAGNOSIS — E876 Hypokalemia: Secondary | ICD-10-CM | POA: Diagnosis not present

## 2018-06-14 DIAGNOSIS — K9189 Other postprocedural complications and disorders of digestive system: Secondary | ICD-10-CM | POA: Diagnosis not present

## 2018-06-14 DIAGNOSIS — J438 Other emphysema: Secondary | ICD-10-CM | POA: Diagnosis not present

## 2018-06-14 DIAGNOSIS — R64 Cachexia: Secondary | ICD-10-CM | POA: Diagnosis present

## 2018-06-14 DIAGNOSIS — L97529 Non-pressure chronic ulcer of other part of left foot with unspecified severity: Secondary | ICD-10-CM | POA: Diagnosis present

## 2018-06-14 DIAGNOSIS — J439 Emphysema, unspecified: Secondary | ICD-10-CM

## 2018-06-14 DIAGNOSIS — J9621 Acute and chronic respiratory failure with hypoxia: Secondary | ICD-10-CM | POA: Diagnosis not present

## 2018-06-14 DIAGNOSIS — J431 Panlobular emphysema: Secondary | ICD-10-CM | POA: Diagnosis not present

## 2018-06-14 DIAGNOSIS — Z136 Encounter for screening for cardiovascular disorders: Secondary | ICD-10-CM | POA: Diagnosis not present

## 2018-06-14 DIAGNOSIS — K5669 Other partial intestinal obstruction: Secondary | ICD-10-CM | POA: Diagnosis not present

## 2018-06-14 DIAGNOSIS — E46 Unspecified protein-calorie malnutrition: Secondary | ICD-10-CM | POA: Diagnosis not present

## 2018-06-14 DIAGNOSIS — J449 Chronic obstructive pulmonary disease, unspecified: Secondary | ICD-10-CM | POA: Diagnosis not present

## 2018-06-14 DIAGNOSIS — Z681 Body mass index (BMI) 19 or less, adult: Secondary | ICD-10-CM | POA: Diagnosis not present

## 2018-06-14 DIAGNOSIS — J9601 Acute respiratory failure with hypoxia: Secondary | ICD-10-CM | POA: Diagnosis not present

## 2018-06-14 DIAGNOSIS — J90 Pleural effusion, not elsewhere classified: Secondary | ICD-10-CM | POA: Diagnosis not present

## 2018-06-14 DIAGNOSIS — M79605 Pain in left leg: Secondary | ICD-10-CM | POA: Diagnosis not present

## 2018-06-14 DIAGNOSIS — R410 Disorientation, unspecified: Secondary | ICD-10-CM | POA: Diagnosis not present

## 2018-06-14 DIAGNOSIS — F17200 Nicotine dependence, unspecified, uncomplicated: Secondary | ICD-10-CM | POA: Diagnosis not present

## 2018-06-14 MED ORDER — PHENOL 1.4 % MT LIQD
1.0000 | OROMUCOSAL | Status: DC | PRN
  Administered 2018-06-14 – 2018-06-19 (×3): 1 via OROMUCOSAL
  Filled 2018-06-14: qty 177

## 2018-06-14 MED ORDER — HYDRALAZINE HCL 20 MG/ML IJ SOLN
5.0000 mg | Freq: Four times a day (QID) | INTRAMUSCULAR | Status: DC | PRN
  Administered 2018-06-14 – 2018-06-27 (×5): 5 mg via INTRAVENOUS
  Filled 2018-06-14 (×5): qty 1

## 2018-06-14 MED ORDER — ONDANSETRON HCL 4 MG/2ML IJ SOLN
4.0000 mg | Freq: Four times a day (QID) | INTRAMUSCULAR | Status: DC | PRN
  Administered 2018-06-14: 4 mg via INTRAVENOUS
  Filled 2018-06-14: qty 2

## 2018-06-14 MED ORDER — LABETALOL HCL 5 MG/ML IV SOLN
10.0000 mg | Freq: Three times a day (TID) | INTRAVENOUS | Status: DC
  Administered 2018-06-14 – 2018-06-22 (×23): 10 mg via INTRAVENOUS
  Filled 2018-06-14 (×26): qty 4

## 2018-06-14 MED ORDER — ALBUTEROL SULFATE HFA 108 (90 BASE) MCG/ACT IN AERS
1.0000 | INHALATION_SPRAY | RESPIRATORY_TRACT | Status: DC | PRN
  Filled 2018-06-14: qty 6.7

## 2018-06-14 MED ORDER — KCL IN DEXTROSE-NACL 20-5-0.45 MEQ/L-%-% IV SOLN
INTRAVENOUS | Status: DC
  Administered 2018-06-14 – 2018-06-16 (×4): via INTRAVENOUS
  Administered 2018-06-17: 100 mL/h via INTRAVENOUS
  Administered 2018-06-17: 14:00:00 via INTRAVENOUS
  Filled 2018-06-14 (×8): qty 1000

## 2018-06-14 MED ORDER — MORPHINE SULFATE (PF) 2 MG/ML IV SOLN
2.0000 mg | INTRAVENOUS | Status: DC | PRN
  Administered 2018-06-14 (×3): 2 mg via INTRAVENOUS
  Filled 2018-06-14 (×3): qty 1

## 2018-06-14 MED ORDER — IPRATROPIUM-ALBUTEROL 0.5-2.5 (3) MG/3ML IN SOLN
3.0000 mL | Freq: Four times a day (QID) | RESPIRATORY_TRACT | Status: DC | PRN
  Administered 2018-06-14 – 2018-06-17 (×5): 3 mL via RESPIRATORY_TRACT
  Filled 2018-06-14 (×6): qty 3

## 2018-06-14 MED ORDER — METOPROLOL SUCCINATE ER 50 MG PO TB24
50.0000 mg | ORAL_TABLET | Freq: Every morning | ORAL | Status: DC
  Administered 2018-06-14: 50 mg via ORAL
  Filled 2018-06-14 (×2): qty 1

## 2018-06-14 MED ORDER — ENOXAPARIN SODIUM 40 MG/0.4ML ~~LOC~~ SOLN
40.0000 mg | SUBCUTANEOUS | Status: DC
  Administered 2018-06-14: 40 mg via SUBCUTANEOUS
  Filled 2018-06-14: qty 0.4

## 2018-06-14 MED ORDER — NICOTINE POLACRILEX 2 MG MT GUM: 2.0000 mg | CHEWING_GUM | OROMUCOSAL | Status: DC | PRN

## 2018-06-14 MED ORDER — ONDANSETRON 4 MG PO TBDP: 4.0000 mg | ORAL_TABLET | Freq: Four times a day (QID) | ORAL | Status: DC | PRN

## 2018-06-14 MED ORDER — NICOTINE 14 MG/24HR TD PT24
14.0000 mg | MEDICATED_PATCH | Freq: Every day | TRANSDERMAL | Status: DC
  Administered 2018-06-14 – 2018-06-15 (×2): 14 mg via TRANSDERMAL
  Filled 2018-06-14 (×3): qty 1

## 2018-06-14 MED ORDER — MORPHINE SULFATE (PF) 4 MG/ML IV SOLN
4.0000 mg | INTRAVENOUS | Status: DC | PRN
  Administered 2018-06-14 – 2018-06-15 (×4): 4 mg via INTRAVENOUS
  Filled 2018-06-14 (×4): qty 1

## 2018-06-14 MED ORDER — BUPROPION HCL ER (XL) 150 MG PO TB24
150.0000 mg | ORAL_TABLET | Freq: Every morning | ORAL | Status: DC
  Administered 2018-06-14 – 2018-06-17 (×4): 150 mg via ORAL
  Filled 2018-06-14 (×4): qty 1

## 2018-06-14 MED ORDER — AMLODIPINE BESYLATE 10 MG PO TABS
10.0000 mg | ORAL_TABLET | Freq: Every morning | ORAL | Status: DC
  Administered 2018-06-14: 10 mg via ORAL
  Filled 2018-06-14: qty 1

## 2018-06-14 MED ORDER — MENTHOL 3 MG MT LOZG: 1.0000 | LOZENGE | OROMUCOSAL | Status: DC | PRN

## 2018-06-14 NOTE — Progress Notes (Signed)
Initial Nutrition Assessment  DOCUMENTATION CODES:   Underweight, Severe malnutrition in context of chronic illness  INTERVENTION:    Carnation Instant Breakfast PO TID, each supplement provides 220 kcal and 13 grams of protein once diet advanced   Provide MVI daily  NUTRITION DIAGNOSIS:   Severe Malnutrition related to chronic illness(COPD) as evidenced by energy intake < or equal to 75% for > or equal to 1 month, severe fat depletion, severe muscle depletion, percent weight loss.  GOAL:   Patient will meet greater than or equal to 90% of their needs  MONITOR:   PO intake, Supplement acceptance, Weight trends, Labs, Diet advancement, I & O's  REASON FOR ASSESSMENT:   Malnutrition Screening Tool    ASSESSMENT:   Patient with PMH significant for COPD, ischemic bowel disease s/p prior abdominal surgery with colostomy, lung cancer s/p radiation, HTN, and tobacco abuse. Presents this admission with SBO due to hernia.    1/6- NGT placed to suction   Pt endorses having a loss in appetite two days PTA due to ongoing vomiting. States during this time period he attempted a liquid diet but continued to vomit. Pt typically drinks two carnation instant breakfast packets with whole milk in the morning and has one meal (meat, vegetable, grain) in the afternoon. Denies any issues with chewing or swallowing. Discussed the importance of protein intake for preservation of lean body mass. Advised pt to increase supplements at home if meal quantity does not increase. Pt currently with NGT to suction. Discussed diet progression and supplement options. Pt would like to continue CIB once able.   Pt reports he has maintained a smaller weight his whole life. States after he was diagnosed with lung cancer his UBW stayed around 94 lb. Records indicate Pt weighed 98 ln on 11/23/17 and 88 lb this admission (10.2% wt loss in six months, significant for time frame).   Medications reviewed and include: D5  with 20 mEq KCL @ 100 ml/hr Labs reviewed: Na 133 (L)   NUTRITION - FOCUSED PHYSICAL EXAM:    Most Recent Value  Orbital Region  Severe depletion  Upper Arm Region  Severe depletion  Thoracic and Lumbar Region  Severe depletion  Buccal Region  Severe depletion  Temple Region  Severe depletion  Clavicle Bone Region  Severe depletion  Clavicle and Acromion Bone Region  Severe depletion  Scapular Bone Region  Severe depletion  Dorsal Hand  Severe depletion  Patellar Region  Severe depletion  Anterior Thigh Region  Severe depletion  Posterior Calf Region  Severe depletion  Edema (RD Assessment)  None  Hair  Reviewed  Eyes  Reviewed  Mouth  Reviewed  Skin  Reviewed  Nails  Reviewed     Diet Order:   Diet Order            Diet NPO time specified Except for: Ice Chips, Sips with Meds  Diet effective now              EDUCATION NEEDS:   Education needs have been addressed  Skin:  Skin Assessment: Reviewed RN Assessment  Last BM:  06/12/17  Height:   Ht Readings from Last 1 Encounters:  06/13/18 5\' 2"  (1.575 m)    Weight:   Wt Readings from Last 1 Encounters:  06/13/18 39.9 kg    Ideal Body Weight:  53.6 kg  BMI:  Body mass index is 16.1 kg/m.  Estimated Nutritional Needs:   Kcal:  1450-1650 kcal  Protein:  70-85 grams  Fluid:  >/= 1.4 L/day   Mariana Single RD, LDN Clinical Nutrition Pager # [phone]

## 2018-06-14 NOTE — Progress Notes (Addendum)
Patient ID: [name], male   DOB: 1950-09-29, 68 y.o.   MRN: [id number]       Subjective: Patient reports he is doing better overnight. Still complains of generalized abdominal pain. Requesting pain medication and ice chips. Some nausea intermittently. No flatus or BM.   Objective: Vital signs in last 24 hours: Temp:  [97.9 F (36.6 C)-98.3 F (36.8 C)] 98.3 F (36.8 C) (01/06 0834) Pulse Rate:  [77-94] 93 (01/06 0834) Resp:  [10-18] 15 (01/06 0834) BP: (144-179)/(80-98) 165/97 (01/06 0834) SpO2:  [89 %-97 %] 90 % (01/06 0834) Weight:  [39.9 kg] 39.9 kg (01/05 1849) Last BM Date: 06/12/18  Intake/Output from previous day: 01/05 0701 - 01/06 0700 In: -  Out: 400  Intake/Output this shift: Total I/O In: 357.5 [I.V.:357.5] Out: -   PE: Heart: RRR Lungs: CTA b/l Abd: Soft, mildly distended. Decreased BS. Generalized tenderness. No r/r/g. Right inguinal hernia easily reducible. Left inguinal hernia reducible but spontaneously reoccurs.    Lab Results:  Recent Labs    06/13/18 1924  WBC 8.1  HGB 14.1  HCT 40.9  PLT 241   BMET Recent Labs    06/13/18 1924  NA 133*  K 4.0  CL 89*  CO2 31  GLUCOSE 167*  BUN 15  CREATININE 0.82  CALCIUM 10.2   PT/INR No results for input(s): LABPROT, INR in the last 72 hours. CMP     Component Value Date/Time   NA 133 (L) 06/13/2018 1924   NA 137 05/11/2017 1437   NA 133 (L) 10/06/2016 1220   K 4.0 06/13/2018 1924   K 4.7 10/06/2016 1220   CL 89 (L) 06/13/2018 1924   CO2 31 06/13/2018 1924   CO2 26 10/06/2016 1220   GLUCOSE 167 (H) 06/13/2018 1924   GLUCOSE 99 10/06/2016 1220   BUN 15 06/13/2018 1924   BUN 8 05/11/2017 1437   BUN 12.0 10/06/2016 1220   CREATININE 0.82 06/13/2018 1924   CREATININE 0.8 10/06/2016 1220   CALCIUM 10.2 06/13/2018 1924   CALCIUM 10.1 10/06/2016 1220   PROT 8.1 06/13/2018 1924   PROT 7.3 11/20/2014 1352   ALBUMIN 4.0 06/13/2018 1924   ALBUMIN 3.6 11/20/2014 1352   AST 36  06/13/2018 1924   AST 39 (H) 11/20/2014 1352   ALT 18 06/13/2018 1924   ALT 20 11/20/2014 1352   ALKPHOS 98 06/13/2018 1924   ALKPHOS 131 11/20/2014 1352   BILITOT 0.4 06/13/2018 1924   BILITOT 0.29 11/20/2014 1352   GFRNONAA >60 06/13/2018 1924   GFRAA >60 06/13/2018 1924   Lipase     Component Value Date/Time   LIPASE 53 (H) 06/13/2018 1924       Studies/Results: Ct Abdomen Pelvis W Contrast  Result Date: 06/13/2018 CLINICAL DATA:  Nausea, vomiting, bowel obstruction, history COPD, hypertension, vascular disease, pulmonary hypertension, smoker, pancreatitis, lung cancer EXAM: CT ABDOMEN AND PELVIS WITH CONTRAST TECHNIQUE: Multidetector CT imaging of the abdomen and pelvis was performed using the standard protocol following bolus administration of intravenous contrast. Sagittal and coronal MPR images reconstructed from axial data set. CONTRAST:  169mL ISOVUE-300 IOPAMIDOL (ISOVUE-300) INJECTION 61% IV. No oral contrast administered. COMPARISON:  04/08/2016 Correlation: PET-CT 10/19/2017 FINDINGS: Lower chest: Lung bases appear emphysematous with a calcified granuloma at RIGHT lung base. Hepatobiliary: Gallbladder and liver normal appearance Pancreas: He atrophic pancreas with numerous parenchymal calcifications and minimal ductal dilatation consistent with chronic calcific pancreatitis. No definite pancreatic mass. Spleen: Small spleen without focal mass Adrenals/Urinary Tract: Tiny RIGHT  renal cyst. Adrenal glands, kidneys, ureters and bladder otherwise unremarkable. Stomach/Bowel: Colon decompressed. Appendix not visualized. Distended stomach with small hiatal hernia. Numerous dilated proximal and mid small bowel loops with decompressed distal small bowel loops consistent with small bowel obstruction. Transition point is at a small bowel loop located within a LEFT inguinal hernia. Additional fat within LEFT inguinal hernia. RIGHT inguinal hernia also present, containing ascites but no bowel.  No definite bowel wall thickening. Vascular/Lymphatic: Extensive atherosclerotic calcifications of aorta, visceral arteries, iliac arteries. Aorta normal caliber. Stents identified at the RIGHT common and external iliac arteries. No adenopathy. Reproductive: Small cyst at posterior aspect of prostate gland unchanged. Other: Scattered ascites.  No free air. Musculoskeletal: Bones demineralized. IMPRESSION: Mid to distal small bowel obstruction secondary to an obstructed small bowel loop within a LEFT inguinal hernia. RIGHT inguinal hernia containing ascites but no bowel loops. Chronic calcific pancreatitis. Extensive atherosclerotic disease. Small hiatal hernia. Electronically Signed   By: Lavonia Dana M.D.   On: 06/13/2018 22:56   Dg Abd Portable 1 View  Result Date: 06/14/2018 CLINICAL DATA:  NG tube placement. EXAM: PORTABLE ABDOMEN - 1 VIEW COMPARISON:  Radiograph earlier this day. FINDINGS: Tip and side port of the enteric tube below the diaphragm in the stomach. Dilated small bowel in the upper abdomen. Excreted IV contrast within the right renal collecting system from prior CT. Pancreatic calcifications in the upper abdomen. IMPRESSION: Tip and side port of the enteric tube below the diaphragm in the stomach. Electronically Signed   By: Keith Rake M.D.   On: 06/14/2018 01:57   Dg Abd Portable 1 View  Result Date: 06/14/2018 CLINICAL DATA:  NG tube placement EXAM: PORTABLE ABDOMEN - 1 VIEW COMPARISON:  None. FINDINGS: A gastric tube is seen coiled upon itself within the mid thorax presumably within the mid esophagus. The tip is excluded on this study. The side-port is at the level of the T7 vertebral body. Dilated small bowel loops persist this patient with known small bowel obstruction. No free air is visualized. IMPRESSION: A gastric tube is seen coiled upon itself within the mid thorax presumably within the mid esophagus. Repositioning is therefore advised. The side port is at the level of the T7  vertebral body. Electronically Signed   By: Ashley Royalty M.D.   On: 06/14/2018 00:59    Anti-infectives: Anti-infectives (From admission, onward)   None       Assessment/Plan SBO 2/2 left inguinal hernia -Left inguinal hernia is reducible but spontaneously reoccurs. No concern for strangulation at this time.  -Hold Plavix. Will likely need surgery this admission. -NG tube in place. Monitor I/O. No documented output last night. 400cc in canister at bedside. -Mobilize -IS -Repeat film in AM -Repeat labs in AM. -Patient will need medical clearance 2/2 multiple medical problems as below and medication management.  HTN  -Consult to hospitalist.  COPD -Consult to hospitalist  Hx of Lung CA s/p radiation  -s/p radiation therapy 12/2017.  -Last note by oncology recommended f/u. No evidence of follow up or repeat imaging after last radiation therapy  -Consult to medicine  PVD s/p stenting of right leg -S/p stenting of RT ext iliac artery 03/30/18.  -Consult to medicine -Consult to cardiology (Dr. Virgina Jock) per medicine request   FEN - NPO (except ice chips) VTE - Lovenox ID - None   LOS: 0 days    Jillyn Ledger , [facility] Surgery 06/14/2018, 8:45 AM Pager: [phone]

## 2018-06-14 NOTE — Consult Note (Signed)
Hospitalist Service Medical Consultation   Hardeep Reetz  AQT:[id number]  DOB: [birth year]  DOA: 06/13/2018  PCP: [name], MD   Outpatient Specialists:  [facility] - Gastroenterology Jaynee Eagles - Neurology   Requesting physician: Alferd Apa, PA-C  Reason for consultation: Perioperative management of COPD, HTN, PVD; pre-operative cardiac clearance, pre-operative pulmonary clearance          History of Present Illness: [name] is an 68 y.o. 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  The hospitalist service were asked to evaluate for above reasons.    The patient first developed nausea, vomiting and severe colicky abdominal pain 1 day prior to admission.  It did not relent so he presented to the ER hwere he was found to have SBO, at the point of his left inguinal hernia.  Admitted to surgical service, with plans for operative management during this hospitalization.  NG tube placed overnight.    Abdominal pain mild to moderate, intermittent, improved with NG and opiate.  Still output in NG.  No new fever, confusion.  No cough, sputum, dyspnea more than baseline.  No chest pain, orthopnea, leg swelling.  He has claudication with walking just a few feet at baseline, was to see [facility] today.  Also has new black ulcer on left great toe today.           Review of Systems:  Review of Systems  Constitutional: Negative for chills, fever and malaise/fatigue.  Respiratory: Negative for cough, hemoptysis, sputum production, shortness of breath and wheezing.   Cardiovascular: Negative for chest pain, palpitations, orthopnea, claudication, leg swelling and PND.    Gastrointestinal: Positive for abdominal pain, nausea and vomiting. Negative for blood in stool, diarrhea and melena.  All other systems reviewed and are negative.     Past Medical History: Past Medical History:  Diagnosis Date  . COPD (chronic obstructive pulmonary disease) ([place])   . Hypertension   . Iliac artery stenosis, right (HCC)    80% Stenosis  . 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[place])   . Lower limb ischemia 03/26/2018  . Lung cancer dx'd 12/2014  . Pancreatitis    x2 stents placed to make patent duct to pancreas  . Pulmonary hypertension ([place])   . Severe claudication ([place])   . Status post left foot surgery   . Tobacco abuse     Past Surgical History: Past Surgical History:  Procedure Laterality Date  . ABDOMINAL AORTOGRAM W/LOWER EXTREMITY N/A 03/30/2018   Procedure: ABDOMINAL AORTOGRAM W/LOWER EXTREMITY;  Surgeon: Nigel Mormon, MD;  Location: [facility];  Service: Cardiovascular;  Laterality: N/A;  . COLECTOMY    . COLOSTOMY    . COLOSTOMY REVERSAL    . EUS N/A 12/14/2014   Procedure: UPPER ENDOSCOPIC ULTRASOUND (EUS) LINEAR;  Surgeon: Milus Banister, MD;  Location: [facility];  Service: Endoscopy;  Laterality: N/A;  . FOOT SURGERY Left 2006  . PANCREAS SURGERY  2007  . PERIPHERAL VASCULAR INTERVENTION  03/30/2018   Procedure: PERIPHERAL VASCULAR INTERVENTION;  Surgeon: Nigel Mormon, MD;  Location: [facility];  Service: Cardiovascular;;  Rt Iliac  .  UPPER GI ENDOSCOPY    . VENTRAL HERNIA REPAIR  1967     Allergies:   Allergies  Allergen Reactions  . Chantix [Varenicline] Other (See Comments)    Physically ill  . Prevacid [Lansoprazole] Other (See Comments)    "swell up with gas"     Social History:  reports that he has been smoking cigarettes. He has a 25.00 pack-year smoking history. He has never used smokeless tobacco. He reports current alcohol use. He reports that he does not use drugs.   Family History: Family History   Problem Relation Age of Onset  . COPD Mother   . Diabetes Father   . Cancer Brother        metastatic, unknown primary  . Cancer Paternal Grandmother      Physical Exam: Vitals:   06/14/18 0637 06/14/18 0730 06/14/18 0834 06/14/18 0917  BP: (!) 158/93 (!) 164/90 (!) 165/97 (!) 180/88  Pulse: 87  93 83  Resp: 18 16 15 16   Temp:   98.3 F (36.8 C) (!) 97.5 F (36.4 C)  TempSrc:   Oral Oral  SpO2: 93%  90% 96%  Weight:      Height:        Constitutional: Alert and awake, oriented x3, not in any acute distress. Eyes: Pupils equal, EOMI, irises appear normal, anicteric sclera,  ENMT: external ears and nose appear normal, hearing normal            Lips appears normal, oropharynx mucosa, tongue, posterior pharynx appear normal, dentition poor CVS: S1-S2 clear, no murmur rubs or gallops, no LE edema Respiratory:  clear to auscultation bilaterally, no wheezing, rales or rhonchi. Respiratory effort normal. No accessory muscle use.  GI: old large scars, abdomen guarding, no rebound, tender diffusely, bilateral large inguinal hernias  Musculoskeletal: no cyanosis, clubbing or edema noted bilaterally, diffuse loss of subq muscle mass and fat Neuro: Cranial nerves II-XII intact, normal strength, sensation, speech fluient Psych: judgement and insight appear normal, stable mood and affect, mental status Skin: no jaundice, there is a small dark macule on the left base of great toe, appears idschemic ulcer   Data reviewed:  I have personally reviewed following labs and imaging studies Labs:  CBC: Recent Labs  Lab 06/13/18 1924  WBC 8.1  HGB 14.1  HCT 40.9  MCV 95.3  PLT 659    Basic Metabolic Panel: Recent Labs  Lab 06/13/18 1924  NA 133*  K 4.0  CL 89*  CO2 31  GLUCOSE 167*  BUN 15  CREATININE 0.82  CALCIUM 10.2   GFR Estimated Creatinine Clearance: 50 mL/min (by C-G formula based on SCr of 0.82 mg/dL). Liver Function Tests: Recent Labs  Lab 06/13/18 1924  AST 36   ALT 18  ALKPHOS 98  BILITOT 0.4  PROT 8.1  ALBUMIN 4.0   Recent Labs  Lab 06/13/18 1924  LIPASE 53*   No results for input(s): AMMONIA in the last 168 hours. Coagulation profile No results for input(s): INR, PROTIME in the last 168 hours.  Urinalysis    Component Value Date/Time   COLORURINE YELLOW 04/08/2016 [place] 04/08/2016 0454   LABSPEC 1.013 04/08/2016 0454   PHURINE 7.5 04/08/2016 0454   GLUCOSEU 100 (A) 04/08/2016 0454   HGBUR NEGATIVE 04/08/2016 [place] 04/08/2016 [place] 04/08/2016 0454   PROTEINUR >300 (A) 04/08/2016 0454   NITRITE NEGATIVE 04/08/2016 0454   LEUKOCYTESUR NEGATIVE 04/08/2016 0454  Inpatient Medications:   Scheduled Meds: . amLODipine  10 mg Oral q morning - 10a  . buPROPion  150 mg Oral q morning - 10a  . enoxaparin (LOVENOX) injection  40 mg Subcutaneous Q24H  . iopamidol      . metoprolol succinate  50 mg Oral q morning - 10a  . sodium chloride (PF)       Continuous Infusions: . dextrose 5 % and 0.45 % NaCl with KCl 20 mEq/L 100 mL/hr at 06/14/18 0747     Radiological Exams on Admission: Ct Abdomen Pelvis W Contrast  Result Date: 06/13/2018 CLINICAL DATA:  Nausea, vomiting, bowel obstruction, history COPD, hypertension, vascular disease, pulmonary hypertension, smoker, pancreatitis, lung cancer EXAM: CT ABDOMEN AND PELVIS WITH CONTRAST TECHNIQUE: Multidetector CT imaging of the abdomen and pelvis was performed using the standard protocol following bolus administration of intravenous contrast. Sagittal and coronal MPR images reconstructed from axial data set. CONTRAST:  161mL ISOVUE-300 IOPAMIDOL (ISOVUE-300) INJECTION 61% IV. No oral contrast administered. COMPARISON:  04/08/2016 Correlation: PET-CT 10/19/2017 FINDINGS: Lower chest: Lung bases appear emphysematous with a calcified granuloma at RIGHT lung base. Hepatobiliary: Gallbladder and liver normal appearance Pancreas:  He atrophic pancreas with numerous parenchymal calcifications and minimal ductal dilatation consistent with chronic calcific pancreatitis. No definite pancreatic mass. Spleen: Small spleen without focal mass Adrenals/Urinary Tract: Tiny RIGHT renal cyst. Adrenal glands, kidneys, ureters and bladder otherwise unremarkable. Stomach/Bowel: Colon decompressed. Appendix not visualized. Distended stomach with small hiatal hernia. Numerous dilated proximal and mid small bowel loops with decompressed distal small bowel loops consistent with small bowel obstruction. Transition point is at a small bowel loop located within a LEFT inguinal hernia. Additional fat within LEFT inguinal hernia. RIGHT inguinal hernia also present, containing ascites but no bowel. No definite bowel wall thickening. Vascular/Lymphatic: Extensive atherosclerotic calcifications of aorta, visceral arteries, iliac arteries. Aorta normal caliber. Stents identified at the RIGHT common and external iliac arteries. No adenopathy. Reproductive: Small cyst at posterior aspect of prostate gland unchanged. Other: Scattered ascites.  No free air. Musculoskeletal: Bones demineralized. IMPRESSION: Mid to distal small bowel obstruction secondary to an obstructed small bowel loop within a LEFT inguinal hernia. RIGHT inguinal hernia containing ascites but no bowel loops. Chronic calcific pancreatitis. Extensive atherosclerotic disease. Small hiatal hernia. Electronically Signed   By: Lavonia Dana M.D.   On: 06/13/2018 22:56   Dg Abd Portable 1 View  Result Date: 06/14/2018 CLINICAL DATA:  NG tube placement. EXAM: PORTABLE ABDOMEN - 1 VIEW COMPARISON:  Radiograph earlier this day. FINDINGS: Tip and side port of the enteric tube below the diaphragm in the stomach. Dilated small bowel in the upper abdomen. Excreted IV contrast within the right renal collecting system from prior CT. Pancreatic calcifications in the upper abdomen. IMPRESSION: Tip and side port of the  enteric tube below the diaphragm in the stomach. Electronically Signed   By: Keith Rake M.D.   On: 06/14/2018 01:57   Dg Abd Portable 1 View  Result Date: 06/14/2018 CLINICAL DATA:  NG tube placement EXAM: PORTABLE ABDOMEN - 1 VIEW COMPARISON:  None. FINDINGS: A gastric tube is seen coiled upon itself within the mid thorax presumably within the mid esophagus. The tip is excluded on this study. The side-port is at the level of the T7 vertebral body. Dilated small bowel loops persist this patient with known small bowel obstruction. No free air is visualized. IMPRESSION: A gastric tube is seen coiled upon itself within the mid thorax presumably within the mid esophagus. Repositioning  is therefore advised. The side port is at the level of the T7 vertebral body. Electronically Signed   By: Ashley Royalty M.D.   On: 06/14/2018 00:59    Impression/Recommendations  SBO Agree with current management for bowel rest, NG, IVF. From the pre-operative standpoint, the patient has an RCRI of 1, would be slightly above average risk of perioperative CV event due to smoking, PV disease.  With respect to respiratory disease, he is again above average risk for pulmonary complications but appears asymptomatic and optimized for surgery.  -No further testing recommended -Hold Plavix -Consult Cardiology -Continue beta-blocker and amlodipine, improve BP control as below - Duo-neb PRN   Hypertension BP elevated.   -Hold oral amlodipine, metoprolol wihle NG tube on suction, restart as soon as tube out -Start scheduled labetalol  -IV hydralazine PRN   Peripheral vascular disease Left great toe ischemic ulcer If urgent surgery is needed and cannot be deferred to elective basis, agree with holding Plavix. -Recommend Cardiology consultation to clarify risk of holding Plavix -Needs prompt follow up with Vascular Surgery after discharge  COPD No active disease.   -Albuterol-ipratropium PRN  Lung Ca T1 lesion  in LUL in 2016, cured with Radx.  Second lesion followed in RUL, biopsied 2019, also T1, also cured with Radx.  Noncontributory to present admission.  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  It is noncontributory to his present admission.  -Encourage follow up with Neurology, which he appears to be neglecting                    Thank you for this consultation.  Our [facility] hospitalist team will follow the patient with you.     Edwin Dada M.D. [facility] 06/14/2018, 9:40 AM

## 2018-06-14 NOTE — ED Notes (Signed)
ED TO INPATIENT HANDOFF REPORT  Name/Age/Gender [name] 68 y.o. male  Code Status    Code Status Orders  (From admission, onward)         Start     Ordered   06/14/18 0324  Full code  Continuous     06/14/18 0323        Code Status History    Date Active Date Inactive Code Status Order ID Comments User Context   01/04/2015 1552 01/06/2015 1412 Full Code [id number]  Debbe Odea, MD Inpatient    Advance Directive Documentation     Most Recent Value  Type of Advance Directive  Living will  Pre-existing out of facility DNR order (yellow form or pink MOST form)  -  "MOST" Form in Place?  -      Home/SNF/Other Home  Chief Complaint Vomiting / Generalized Weakness/ Abdominal Pain   Level of Care/Admitting Diagnosis ED Disposition    ED Disposition Condition [place]: [facility] [[id number]]  Level of Care: Med-Surg [16]  Diagnosis: Inguinal hernia of left side with obstruction and without gangrene [[id number]]  Admitting Physician: [place]  Attending Physician: CCS, MD [[id number]]  Estimated length of stay: 3 - 4 days  Certification:: I certify this patient will need inpatient services for at least 2 midnights  PT Class (Do Not Modify): Inpatient [101]  PT Acc Code (Do Not Modify): Private [1]       Medical History Past Medical History:  Diagnosis Date  . COPD (chronic obstructive pulmonary disease) ([place])   . Hypertension   . Iliac artery stenosis, right (HCC)    80% Stenosis  . 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[place])   . Lower limb ischemia 03/26/2018  . Lung cancer dx'd 12/2014  . Pancreatitis    x2 stents placed to make patent duct to pancreas  . Pulmonary hypertension ([place])   . Severe claudication ([place])   . Status post left foot surgery   . Tobacco abuse     Allergies Allergies  Allergen Reactions  . Chantix [Varenicline] Other (See Comments)    Physically ill  . Prevacid [Lansoprazole] Other (See Comments)     "swell up with gas"    IV Location/Drains/Wounds Patient Lines/Drains/Airways Status   Active Line/Drains/Airways    Name:   Placement date:   Placement time:   Site:   Days:   Peripheral IV 06/13/18 Right;Lateral Forearm   06/13/18    2109    Forearm   1   NG/OG Tube Nasogastric 18 Fr. Right nare Xray Documented cm marking at nare/ corner of mouth   06/14/18    0028    Right nare   less than 1   Incision (Closed) 11/23/17 Chest Right   11/23/17    1221     203          Labs/Imaging Results for orders placed or performed during the hospital encounter of 06/13/18 (from the past 48 hour(s))  Lipase, blood     Status: Abnormal   Collection Time: 06/13/18  7:24 PM  Result Value Ref Range   Lipase 53 (H) 11 - 51 U/L    Comment: Performed at [facility], [place] [address], [place], [place] [address]  Comprehensive metabolic panel     Status: Abnormal   Collection Time: 06/13/18  7:24 PM  Result Value Ref Range   Sodium 133 (L) 135 - 145 mmol/L   Potassium 4.0 3.5 -  5.1 mmol/L   Chloride 89 (L) 98 - 111 mmol/L   CO2 31 22 - 32 mmol/L   Glucose, Bld 167 (H) 70 - 99 mg/dL   BUN 15 8 - 23 mg/dL   Creatinine, Ser 0.82 0.61 - 1.24 mg/dL   Calcium 10.2 8.9 - 10.3 mg/dL   Total Protein 8.1 6.5 - 8.1 g/dL   Albumin 4.0 3.5 - 5.0 g/dL   AST 36 15 - 41 U/L   ALT 18 0 - 44 U/L   Alkaline Phosphatase 98 38 - 126 U/L   Total Bilirubin 0.4 0.3 - 1.2 mg/dL   GFR calc non Af Amer >60 >60 mL/min   GFR calc Af Amer >60 >60 mL/min   Anion gap 13 5 - 15    Comment: Performed at [facility], [place] [address], [place], [place] [address]  CBC     Status: None   Collection Time: 06/13/18  7:24 PM  Result Value Ref Range   WBC 8.1 4.0 - 10.5 K/uL   RBC 4.29 4.22 - 5.81 MIL/uL   Hemoglobin 14.1 13.0 - 17.0 g/dL   HCT 40.9 39.0 - 52.0 %   MCV 95.3 80.0 - 100.0 fL   MCH 32.9 26.0 - 34.0 pg   MCHC 34.5 30.0 - 36.0 g/dL   RDW 14.0 11.5 - 15.5 %   Platelets 241 150  - 400 K/uL   nRBC 0.0 0.0 - 0.2 %    Comment: Performed at [facility], [place] [address] [place], [place] [address]   Ct Abdomen Pelvis W Contrast  Result Date: 06/13/2018 CLINICAL DATA:  Nausea, vomiting, bowel obstruction, history COPD, hypertension, vascular disease, pulmonary hypertension, smoker, pancreatitis, lung cancer EXAM: CT ABDOMEN AND PELVIS WITH CONTRAST TECHNIQUE: Multidetector CT imaging of the abdomen and pelvis was performed using the standard protocol following bolus administration of intravenous contrast. Sagittal and coronal MPR images reconstructed from axial data set. CONTRAST:  143mL ISOVUE-300 IOPAMIDOL (ISOVUE-300) INJECTION 61% IV. No oral contrast administered. COMPARISON:  04/08/2016 Correlation: PET-CT 10/19/2017 FINDINGS: Lower chest: Lung bases appear emphysematous with a calcified granuloma at RIGHT lung base. Hepatobiliary: Gallbladder and liver normal appearance Pancreas: He atrophic pancreas with numerous parenchymal calcifications and minimal ductal dilatation consistent with chronic calcific pancreatitis. No definite pancreatic mass. Spleen: Small spleen without focal mass Adrenals/Urinary Tract: Tiny RIGHT renal cyst. Adrenal glands, kidneys, ureters and bladder otherwise unremarkable. Stomach/Bowel: Colon decompressed. Appendix not visualized. Distended stomach with small hiatal hernia. Numerous dilated proximal and mid small bowel loops with decompressed distal small bowel loops consistent with small bowel obstruction. Transition point is at a small bowel loop located within a LEFT inguinal hernia. Additional fat within LEFT inguinal hernia. RIGHT inguinal hernia also present, containing ascites but no bowel. No definite bowel wall thickening. Vascular/Lymphatic: Extensive atherosclerotic calcifications of aorta, visceral arteries, iliac arteries. Aorta normal caliber. Stents identified at the RIGHT common and external iliac arteries. No adenopathy.  Reproductive: Small cyst at posterior aspect of prostate gland unchanged. Other: Scattered ascites.  No free air. Musculoskeletal: Bones demineralized. IMPRESSION: Mid to distal small bowel obstruction secondary to an obstructed small bowel loop within a LEFT inguinal hernia. RIGHT inguinal hernia containing ascites but no bowel loops. Chronic calcific pancreatitis. Extensive atherosclerotic disease. Small hiatal hernia. Electronically Signed   By: Lavonia Dana M.D.   On: 06/13/2018 22:56   Dg Abd Portable 1 View  Result Date: 06/14/2018 CLINICAL DATA:  NG tube placement. EXAM: PORTABLE ABDOMEN -  1 VIEW COMPARISON:  Radiograph earlier this day. FINDINGS: Tip and side port of the enteric tube below the diaphragm in the stomach. Dilated small bowel in the upper abdomen. Excreted IV contrast within the right renal collecting system from prior CT. Pancreatic calcifications in the upper abdomen. IMPRESSION: Tip and side port of the enteric tube below the diaphragm in the stomach. Electronically Signed   By: Keith Rake M.D.   On: 06/14/2018 01:57   Dg Abd Portable 1 View  Result Date: 06/14/2018 CLINICAL DATA:  NG tube placement EXAM: PORTABLE ABDOMEN - 1 VIEW COMPARISON:  None. FINDINGS: A gastric tube is seen coiled upon itself within the mid thorax presumably within the mid esophagus. The tip is excluded on this study. The side-port is at the level of the T7 vertebral body. Dilated small bowel loops persist this patient with known small bowel obstruction. No free air is visualized. IMPRESSION: A gastric tube is seen coiled upon itself within the mid thorax presumably within the mid esophagus. Repositioning is therefore advised. The side port is at the level of the T7 vertebral body. Electronically Signed   By: Ashley Royalty M.D.   On: 06/14/2018 00:59    Pending Labs Unresulted Labs (From admission, onward)   None      Vitals/Pain Today's Vitals   06/14/18 0400 06/14/18 0430 06/14/18 0637 06/14/18  0730  BP: (!) 164/94 (!) 174/89 (!) 158/93 (!) 164/90  Pulse: 80 86 87   Resp: 10 15 18 16   Temp:      TempSrc:      SpO2: 92% 91% 93%   Weight:      Height:      PainSc:        Isolation Precautions No active isolations  Medications Medications  iopamidol (ISOVUE-300) 61 % injection (has no administration in time range)  sodium chloride (PF) 0.9 % injection (has no administration in time range)  amLODipine (NORVASC) tablet 10 mg (has no administration in time range)  metoprolol succinate (TOPROL-XL) 24 hr tablet 50 mg (has no administration in time range)  buPROPion (WELLBUTRIN XL) 24 hr tablet 150 mg (has no administration in time range)  ipratropium-albuterol (DUONEB) 0.5-2.5 (3) MG/3ML nebulizer solution 3 mL (has no administration in time range)  albuterol (PROVENTIL HFA;VENTOLIN HFA) 108 (90 Base) MCG/ACT inhaler 1 puff (has no administration in time range)  enoxaparin (LOVENOX) injection 40 mg (40 mg Subcutaneous Given 06/14/18 0414)  dextrose 5 % and 0.45 % NaCl with KCl 20 mEq/L infusion ( Intravenous Rate/Dose Verify 06/14/18 0747)  morphine 2 MG/ML injection 2 mg (has no administration in time range)  ondansetron (ZOFRAN-ODT) disintegrating tablet 4 mg (has no administration in time range)    Or  ondansetron (ZOFRAN) injection 4 mg (has no administration in time range)  sodium chloride 0.9 % bolus 1,000 mL (0 mLs Intravenous Stopped 06/14/18 0327)  ondansetron (ZOFRAN) injection 4 mg (4 mg Intravenous Given 06/13/18 2108)  fentaNYL (SUBLIMAZE) injection 25 mcg (25 mcg Intravenous Given 06/13/18 2109)  iopamidol (ISOVUE-300) 61 % injection 100 mL (100 mLs Intravenous Contrast Given 06/13/18 2240)  HYDROmorphone (DILAUDID) injection 1 mg (1 mg Intravenous Given 06/14/18 0021)  lidocaine (XYLOCAINE) 2 % jelly 1 application (1 application Other Given 06/14/18 0022)    Mobility walks with device

## 2018-06-14 NOTE — Consult Note (Signed)
Subjective CC: nausea, vomiting, abdominal pain, inability to tolerate PO HPI:  [name] is a 68 yo male w/ hx of COPD, PVD, HTN, and NSCLC who presents with SBO due to incarcerated left inguinal hernia. He initially presented to the ED last night with complaints of nausea, vomiting, abdominal pain, and inability to tolerate PO that had started about 24 hours prior. He states he was only able to tolerate clear liquids for about 10 minutes before vomiting. He had an NG tube placed this morning, which has mildly improved his abdominal pain. He continues to experience intermittent generalized abdominal pain and bloating. His pain improved from 8/10 to 6/10 with Morphine given. He states he has experienced minimal flatus today, but denies any BM today. His last BM was yesterday prior to arrival at the ED. He is currently tolerating ice chips. 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    He does have hx of COPD, but is not on home oxygen. He is prescribed Ipatropium-Albuterol and Proair HFA inhaler PRN for his COPD. He states he was diagnosed with PNA a few weeks ago, which was treated with antibiotics. Since that time, he states he has felt at his baseline with his COPD. He denies any increased SOB, coughing, or wheezing. He denies any CP. He is a current smoker, with a 25 pack year history.   He also does have hx of PVD with stent placement for R iliac artery stenosis  03/2018. He is currently taking Plavix. 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 He states he experiences leg pain with walking small distances. He recently has noticed an ulcer on the lateral aspect of his left great toe. He denies any hx of foot ulcers.    PMHx: COPD, HTN, PVD, Lung cancer diagnosed 12/2014 s/p radiation, Pancreatitis, Tobacco use, ischemic bowel disease  PSHx: Stenting of right iliac  artery 03/2018, Abdominal surgery with Colostomy and colostomy reversal  Fhx: Mother (deceased) had hx of COPD, Father (alive) has hx of DM Shx: current smoker with 25 pack year history, drinks alcohol daily (about 6 beers), no drug use  Medications: Amlodipine, Bupropion, Clopidogrel, Ipatropium-Albuterol, Mucinex, Metoprolol Succinate, Multi-Vitamin, Proair HFA inhaler Allergies: Varenicline (physically ill), Prevacid ("swell up with gas")  ROS General: denies fever, chills Cardiopulm: Denies CP, cough, SOB, orthopnea GI: + nausea, vomiting, abdominal pain, denies diarrhea  Objective Physical Exam: Cardiac: RRR, no m/r/g Resp: CTA bilaterally, No wheezes, rales or rhonchi Abdominal: Diffuse generalized tenderness with increased tenderness in LLQ, mildly distended, left inguinal hernia noted.   Assessment: 1. SBO due to left incarcerated inguinal hernia 2. COPD 3. PVD of left lower extremity  4. HTN  Plan: 1. Surgery to continue to follow patient's SBO. Patient to have pain and nausea control PRN.   2. Patient to have inhaler PRN for his COPD 3. Consult vascular surgeon regarding patient's PVD 4. Start pt on IV Labetalol for blood pressure control

## 2018-06-15 ENCOUNTER — Inpatient Hospital Stay (HOSPITAL_COMMUNITY): Payer: Medicare Other

## 2018-06-15 DIAGNOSIS — K56609 Unspecified intestinal obstruction, unspecified as to partial versus complete obstruction: Secondary | ICD-10-CM

## 2018-06-15 LAB — CBC
HCT: 35.2 % — ABNORMAL LOW (ref 39.0–52.0)
Hemoglobin: 11.7 g/dL — ABNORMAL LOW (ref 13.0–17.0)
MCH: 32.3 pg (ref 26.0–34.0)
MCHC: 33.2 g/dL (ref 30.0–36.0)
MCV: 97.2 fL (ref 80.0–100.0)
Platelets: 194 10*3/uL (ref 150–400)
RBC: 3.62 MIL/uL — ABNORMAL LOW (ref 4.22–5.81)
RDW: 14.4 % (ref 11.5–15.5)
WBC: 1.8 10*3/uL — ABNORMAL LOW (ref 4.0–10.5)
nRBC: 0 % (ref 0.0–0.2)

## 2018-06-15 LAB — BASIC METABOLIC PANEL
Anion gap: 10 (ref 5–15)
BUN: 15 mg/dL (ref 8–23)
CO2: 29 mmol/L (ref 22–32)
Calcium: 8.8 mg/dL — ABNORMAL LOW (ref 8.9–10.3)
Chloride: 93 mmol/L — ABNORMAL LOW (ref 98–111)
Creatinine, Ser: 0.75 mg/dL (ref 0.61–1.24)
GFR calc Af Amer: >60 mL/min (ref >60)
GFR calc non Af Amer: >60 mL/min (ref >60)
Glucose, Bld: 150 mg/dL — ABNORMAL HIGH (ref 70–99)
Potassium: 3.6 mmol/L (ref 3.5–5.1)
Sodium: 132 mmol/L — ABNORMAL LOW (ref 135–145)

## 2018-06-15 MED ORDER — ENOXAPARIN SODIUM 30 MG/0.3ML ~~LOC~~ SOLN
30.0000 mg | SUBCUTANEOUS | Status: DC
  Administered 2018-06-15 – 2018-06-17 (×3): 30 mg via SUBCUTANEOUS
  Filled 2018-06-15 (×3): qty 0.3

## 2018-06-15 MED ORDER — METHOCARBAMOL 1000 MG/10ML IJ SOLN
500.0000 mg | Freq: Three times a day (TID) | INTRAVENOUS | Status: DC | PRN
  Administered 2018-06-16 (×2): 500 mg via INTRAVENOUS
  Filled 2018-06-15 (×2): qty 5
  Filled 2018-06-15: qty 500
  Filled 2018-06-15: qty 5

## 2018-06-15 MED ORDER — MORPHINE SULFATE (PF) 4 MG/ML IV SOLN
6.0000 mg | INTRAVENOUS | Status: DC | PRN
  Administered 2018-06-15 – 2018-06-18 (×18): 6 mg via INTRAVENOUS
  Filled 2018-06-15 (×19): qty 2

## 2018-06-15 MED ORDER — LIP MEDEX EX OINT
TOPICAL_OINTMENT | CUTANEOUS | Status: AC
  Filled 2018-06-15: qty 7

## 2018-06-15 MED ORDER — IPRATROPIUM-ALBUTEROL 0.5-2.5 (3) MG/3ML IN SOLN
3.0000 mL | Freq: Three times a day (TID) | RESPIRATORY_TRACT | Status: DC
  Administered 2018-06-15 – 2018-06-22 (×21): 3 mL via RESPIRATORY_TRACT
  Filled 2018-06-15 (×20): qty 3

## 2018-06-15 NOTE — Progress Notes (Signed)
Patient ID: [name], male   DOB: 07-25-1950, 68 y.o.   MRN: [id number]       Subjective: Patient reports he continues to feels soreness across "abdominal muscles". Requesting a PCA pump. He is unsure of nausea. No flatus or BM. Now on oxygen which is new from yesterday.   Objective: Vital signs in last 24 hours: Temp:  [97.5 F (36.4 C)-98.8 F (37.1 C)] 97.6 F (36.4 C) (01/07 0531) Pulse Rate:  [75-97] 92 (01/07 0531) Resp:  [16-18] 16 (01/07 0531) BP: (132-180)/(81-98) 151/81 (01/07 0531) SpO2:  [89 %-98 %] 93 % (01/07 0822) Last BM Date: 06/12/18  Intake/Output from previous day: 01/06 0701 - 01/07 0700 In: 765.7 [I.V.:765.7] Out: 1480 [Urine:600; Emesis/NG output:880] Intake/Output this shift: Total I/O In: 120 [P.O.:120] Out: -   PE: Heart: RRR Lungs: Diffuse expiratory wheezing. No crackles. Normal effort.  Abd: Soft, mildly distended. Decreased BS. Generalized tenderness. No r/r/g. Continued b/l inguinal hernias.   Lab Results:  Recent Labs    06/13/18 1924 06/15/18 0513  WBC 8.1 1.8*  HGB 14.1 11.7*  HCT 40.9 35.2*  PLT 241 194   BMET Recent Labs    06/13/18 1924 06/15/18 0513  NA 133* 132*  K 4.0 3.6  CL 89* 93*  CO2 31 29  GLUCOSE 167* 150*  BUN 15 15  CREATININE 0.82 0.75  CALCIUM 10.2 8.8*   PT/INR No results for input(s): LABPROT, INR in the last 72 hours. CMP     Component Value Date/Time   NA 132 (L) 06/15/2018 0513   NA 137 05/11/2017 1437   NA 133 (L) 10/06/2016 1220   K 3.6 06/15/2018 0513   K 4.7 10/06/2016 1220   CL 93 (L) 06/15/2018 0513   CO2 29 06/15/2018 0513   CO2 26 10/06/2016 1220   GLUCOSE 150 (H) 06/15/2018 0513   GLUCOSE 99 10/06/2016 1220   BUN 15 06/15/2018 0513   BUN 8 05/11/2017 1437   BUN 12.0 10/06/2016 1220   CREATININE 0.75 06/15/2018 0513   CREATININE 0.8 10/06/2016 1220   CALCIUM 8.8 (L) 06/15/2018 0513   CALCIUM 10.1 10/06/2016 1220   PROT 8.1 06/13/2018 1924   PROT 7.3 11/20/2014 1352   ALBUMIN 4.0 06/13/2018 1924   ALBUMIN 3.6 11/20/2014 1352   AST 36 06/13/2018 1924   AST 39 (H) 11/20/2014 1352   ALT 18 06/13/2018 1924   ALT 20 11/20/2014 1352   ALKPHOS 98 06/13/2018 1924   ALKPHOS 131 11/20/2014 1352   BILITOT 0.4 06/13/2018 1924   BILITOT 0.29 11/20/2014 1352   GFRNONAA >60 06/15/2018 0513   GFRAA >60 06/15/2018 0513   Lipase     Component Value Date/Time   LIPASE 53 (H) 06/13/2018 1924       Studies/Results: Ct Abdomen Pelvis W Contrast  Result Date: 06/13/2018 CLINICAL DATA:  Nausea, vomiting, bowel obstruction, history COPD, hypertension, vascular disease, pulmonary hypertension, smoker, pancreatitis, lung cancer EXAM: CT ABDOMEN AND PELVIS WITH CONTRAST TECHNIQUE: Multidetector CT imaging of the abdomen and pelvis was performed using the standard protocol following bolus administration of intravenous contrast. Sagittal and coronal MPR images reconstructed from axial data set. CONTRAST:  151mL ISOVUE-300 IOPAMIDOL (ISOVUE-300) INJECTION 61% IV. No oral contrast administered. COMPARISON:  04/08/2016 Correlation: PET-CT 10/19/2017 FINDINGS: Lower chest: Lung bases appear emphysematous with a calcified granuloma at RIGHT lung base. Hepatobiliary: Gallbladder and liver normal appearance Pancreas: He atrophic pancreas with numerous parenchymal calcifications and minimal ductal dilatation consistent with chronic calcific pancreatitis. No definite  pancreatic mass. Spleen: Small spleen without focal mass Adrenals/Urinary Tract: Tiny RIGHT renal cyst. Adrenal glands, kidneys, ureters and bladder otherwise unremarkable. Stomach/Bowel: Colon decompressed. Appendix not visualized. Distended stomach with small hiatal hernia. Numerous dilated proximal and mid small bowel loops with decompressed distal small bowel loops consistent with small bowel obstruction. Transition point is at a small bowel loop located within a LEFT inguinal hernia. Additional fat within LEFT inguinal hernia.  RIGHT inguinal hernia also present, containing ascites but no bowel. No definite bowel wall thickening. Vascular/Lymphatic: Extensive atherosclerotic calcifications of aorta, visceral arteries, iliac arteries. Aorta normal caliber. Stents identified at the RIGHT common and external iliac arteries. No adenopathy. Reproductive: Small cyst at posterior aspect of prostate gland unchanged. Other: Scattered ascites.  No free air. Musculoskeletal: Bones demineralized. IMPRESSION: Mid to distal small bowel obstruction secondary to an obstructed small bowel loop within a LEFT inguinal hernia. RIGHT inguinal hernia containing ascites but no bowel loops. Chronic calcific pancreatitis. Extensive atherosclerotic disease. Small hiatal hernia. Electronically Signed   By: Lavonia Dana M.D.   On: 06/13/2018 22:56   Dg Abd Portable 1v  Result Date: 06/15/2018 CLINICAL DATA:  Small-bowel obstruction. EXAM: PORTABLE ABDOMEN - 1 VIEW COMPARISON:  06/14/2017.  CT 06/13/2018. FINDINGS: NG tube again noted coiled stomach in stable position. Persistent dilated loops of small bowel consistent with small bowel obstruction again noted. No interim change. Stool in the colon. No free air. Pancreatic calcifications again noted. Aortoiliac atherosclerotic vascular disease right iliac stents. Degenerative changes lumbar spine. IMPRESSION: 1.  NG tube noted coiled stomach in stable position. 2. Persistent dilated loops of small bowel consistent with small bowel obstruction again noted. No interim change. Electronically Signed   By: Marcello Moores  Register   On: 06/15/2018 05:17   Dg Abd Portable 1 View  Result Date: 06/14/2018 CLINICAL DATA:  NG tube placement. EXAM: PORTABLE ABDOMEN - 1 VIEW COMPARISON:  Radiograph earlier this day. FINDINGS: Tip and side port of the enteric tube below the diaphragm in the stomach. Dilated small bowel in the upper abdomen. Excreted IV contrast within the right renal collecting system from prior CT. Pancreatic  calcifications in the upper abdomen. IMPRESSION: Tip and side port of the enteric tube below the diaphragm in the stomach. Electronically Signed   By: Keith Rake M.D.   On: 06/14/2018 01:57   Dg Abd Portable 1 View  Result Date: 06/14/2018 CLINICAL DATA:  NG tube placement EXAM: PORTABLE ABDOMEN - 1 VIEW COMPARISON:  None. FINDINGS: A gastric tube is seen coiled upon itself within the mid thorax presumably within the mid esophagus. The tip is excluded on this study. The side-port is at the level of the T7 vertebral body. Dilated small bowel loops persist this patient with known small bowel obstruction. No free air is visualized. IMPRESSION: A gastric tube is seen coiled upon itself within the mid thorax presumably within the mid esophagus. Repositioning is therefore advised. The side port is at the level of the T7 vertebral body. Electronically Signed   By: Ashley Royalty M.D.   On: 06/14/2018 00:59    Anti-infectives: Anti-infectives (From admission, onward)   None       Assessment/Plan SBO 2/2 left inguinal hernia -Plan for operative repair of recurrent LIH with mesh when medically cleared and Plavix held for 5 days.  -NG tube in place. 89ml output overnight. No flatus or BM. Monitor I/O. -Xray with continued dilated loops of small bowel.  -Mobilize -IS -Continue prn pain medication.  -Foley placed  2/2 bladder in right inguinal hernia on CT scan.  -Labs with leukopenia and drop in Hgb (possible dilution). Will monitor. Repeat labs in AM.  -Medicine for medical clearance  HTN  -Per medicine  COPD -Episode of hypoxia last night (89%). Now on 2L. Wheezing on exam. PRN duonebs are ordered per medicine.  -Port CXR  -Per medicine  Hx of Lung CA s/p radiation  Per medicine  PVD s/p stenting of right leg -Per medicine and cardiology -Discussed with Dr. Virgina Jock yesterday. Okay to hold Plavix. He will review the patient.   Foley - In place FEN - NPO (except ice  chips) VTE - Lovenox ID - None   LOS: 1 day    Jillyn Ledger , [facility] 06/15/2018, 9:02 AM Pager: [phone]

## 2018-06-15 NOTE — Progress Notes (Signed)
[facility] PROGRESS NOTE    Progress Note  [name]  JJO:[id number] DOB: 1950-10-24 DOA: 06/13/2018 PCP: [name], MD     Brief Narrative:   [name] is an 68 y.o. 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 The patient will be slightly above average risk for perioperative cardiovascular events due to smoking and peripheral vascular disease. With respect to his COPD he seems to be stable. Consult cardiology. Continue beta-blockers, bloated pain and duo nebs.  Essential hypertension: On labetalol and hydralazine IV blood pressure seems to be fairly controlled continue current regimen.  Referral vascular disease with left great toe ischemic ulcer: Hold Plavix for surgical inguinal repair. Urology has been consulted.  COPD stable: Continue inhalers.  Lung cancer: With oncology as an outpatient. Has received radiation and chemotherapy.  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    Smoker   COPD (chronic obstructive pulmonary disease) (HCC)   Essential hypertension   Protein-calorie malnutrition, severe (HCC)   Severe claudication (HCC)  DVT prophylaxis: lovexno Family Communication:none Disposition Plan/Barrier to D/C: per surgery Code Status:     Code Status Orders  (From admission, onward)         Start     Ordered   06/14/18 0324  Full code  Continuous     06/14/18 0323        Code Status History    Date Active Date Inactive Code Status Order ID Comments User Context   01/04/2015 1552 01/06/2015 1412  Full Code [id number]  Debbe Odea, MD Inpatient    Advance Directive Documentation     Most Recent Value  Type of Advance Directive  Living will, Healthcare Power of Attorney  Pre-existing out of facility DNR order (yellow form or pink MOST form)  -  "MOST" Form in Place?  -        IV Access:    Peripheral IV   Procedures and diagnostic studies:   Ct Abdomen Pelvis W Contrast  Result Date: 06/13/2018 CLINICAL DATA:  Nausea, vomiting, bowel obstruction, history COPD, hypertension, vascular disease, pulmonary hypertension, smoker, pancreatitis, lung cancer EXAM: CT ABDOMEN AND PELVIS WITH CONTRAST TECHNIQUE: Multidetector CT imaging of the abdomen and pelvis was performed using the standard protocol following bolus administration of intravenous contrast. Sagittal and coronal MPR images reconstructed from axial data set. CONTRAST:  170mL ISOVUE-300 IOPAMIDOL (ISOVUE-300) INJECTION 61% IV. No oral contrast administered. COMPARISON:  04/08/2016 Correlation: PET-CT 10/19/2017 FINDINGS: Lower chest: Lung bases appear emphysematous with a calcified granuloma at RIGHT lung base. Hepatobiliary: Gallbladder and liver normal appearance Pancreas: He atrophic pancreas with numerous parenchymal calcifications and minimal ductal dilatation consistent with chronic calcific pancreatitis. No definite pancreatic mass. Spleen: Small spleen without focal mass Adrenals/Urinary Tract: Tiny RIGHT renal cyst. Adrenal glands, kidneys, ureters and bladder otherwise unremarkable. Stomach/Bowel: Colon decompressed. Appendix not visualized. Distended stomach with small hiatal hernia. Numerous dilated proximal and mid small bowel loops with decompressed distal small bowel loops consistent with small bowel obstruction. Transition point is at a small bowel loop located within a LEFT inguinal hernia. Additional fat within  LEFT inguinal hernia. RIGHT inguinal hernia also present, containing ascites but no bowel. No definite bowel  wall thickening. Vascular/Lymphatic: Extensive atherosclerotic calcifications of aorta, visceral arteries, iliac arteries. Aorta normal caliber. Stents identified at the RIGHT common and external iliac arteries. No adenopathy. Reproductive: Small cyst at posterior aspect of prostate gland unchanged. Other: Scattered ascites.  No free air. Musculoskeletal: Bones demineralized. IMPRESSION: Mid to distal small bowel obstruction secondary to an obstructed small bowel loop within a LEFT inguinal hernia. RIGHT inguinal hernia containing ascites but no bowel loops. Chronic calcific pancreatitis. Extensive atherosclerotic disease. Small hiatal hernia. Electronically Signed   By: Lavonia Dana M.D.   On: 06/13/2018 22:56   Dg Abd Portable 1v  Result Date: 06/15/2018 CLINICAL DATA:  Small-bowel obstruction. EXAM: PORTABLE ABDOMEN - 1 VIEW COMPARISON:  06/14/2017.  CT 06/13/2018. FINDINGS: NG tube again noted coiled stomach in stable position. Persistent dilated loops of small bowel consistent with small bowel obstruction again noted. No interim change. Stool in the colon. No free air. Pancreatic calcifications again noted. Aortoiliac atherosclerotic vascular disease right iliac stents. Degenerative changes lumbar spine. IMPRESSION: 1.  NG tube noted coiled stomach in stable position. 2. Persistent dilated loops of small bowel consistent with small bowel obstruction again noted. No interim change. Electronically Signed   By: Marcello Moores  Register   On: 06/15/2018 05:17   Dg Abd Portable 1 View  Result Date: 06/14/2018 CLINICAL DATA:  NG tube placement. EXAM: PORTABLE ABDOMEN - 1 VIEW COMPARISON:  Radiograph earlier this day. FINDINGS: Tip and side port of the enteric tube below the diaphragm in the stomach. Dilated small bowel in the upper abdomen. Excreted IV contrast within the right renal collecting system from prior CT. Pancreatic calcifications in the upper abdomen. IMPRESSION: Tip and side port of the enteric tube below  the diaphragm in the stomach. Electronically Signed   By: Keith Rake M.D.   On: 06/14/2018 01:57   Dg Abd Portable 1 View  Result Date: 06/14/2018 CLINICAL DATA:  NG tube placement EXAM: PORTABLE ABDOMEN - 1 VIEW COMPARISON:  None. FINDINGS: A gastric tube is seen coiled upon itself within the mid thorax presumably within the mid esophagus. The tip is excluded on this study. The side-port is at the level of the T7 vertebral body. Dilated small bowel loops persist this patient with known small bowel obstruction. No free air is visualized. IMPRESSION: A gastric tube is seen coiled upon itself within the mid thorax presumably within the mid esophagus. Repositioning is therefore advised. The side port is at the level of the T7 vertebral body. Electronically Signed   By: Ashley Royalty M.D.   On: 06/14/2018 00:59     Medical Consultants:    None.  Anti-Infectives:   none  Subjective:    [name] relates his pain is controlled, but the morphine only last about an hour. Objective:    Vitals:   06/14/18 2137 06/15/18 0140 06/15/18 0531 06/15/18 0551  BP: (!) 142/89 (!) 145/96 (!) 151/81   Pulse: 97 86 92   Resp: 16 16 16    Temp: 98.8 F (37.1 C) 98.6 F (37 C) 97.6 F (36.4 C)   TempSrc: Oral Oral Oral   SpO2: 97% 98% 98% 97%  Weight:      Height:        Intake/Output Summary (Last 24 hours) at 06/15/2018 0751 Last data filed at 06/15/2018 0741 Gross per 24 hour  Intake 528.21 ml  Output 1480 ml  Net -951.79 ml  Filed Weights   06/13/18 1849  Weight: 39.9 kg    Exam: General exam: In no acute distress. Respiratory system: Good air movement and clear to auscultation. Cardiovascular system: S1 & S2 heard, RRR. No JVD, murmurs, rubs, gallops or clicks.  Gastrointestinal system: Abdomen is nondistended, soft and nontender.  Central nervous system: Alert and oriented. No focal neurological deficits. Extremities: No pedal edema. Skin: No rashes, lesions or  ulcers Psychiatry: Judgement and insight appear normal. Mood & affect appropriate.    Data Reviewed:    Labs: Basic Metabolic Panel: Recent Labs  Lab 06/13/18 1924 06/15/18 0513  NA 133* 132*  K 4.0 3.6  CL 89* 93*  CO2 31 29  GLUCOSE 167* 150*  BUN 15 15  CREATININE 0.82 0.75  CALCIUM 10.2 8.8*   GFR Estimated Creatinine Clearance: 51.3 mL/min (by C-G formula based on SCr of 0.75 mg/dL). Liver Function Tests: Recent Labs  Lab 06/13/18 1924  AST 36  ALT 18  ALKPHOS 98  BILITOT 0.4  PROT 8.1  ALBUMIN 4.0   Recent Labs  Lab 06/13/18 1924  LIPASE 53*   No results for input(s): AMMONIA in the last 168 hours. Coagulation profile No results for input(s): INR, PROTIME in the last 168 hours.  CBC: Recent Labs  Lab 06/13/18 1924 06/15/18 0513  WBC 8.1 1.8*  HGB 14.1 11.7*  HCT 40.9 35.2*  MCV 95.3 97.2  PLT 241 194   Cardiac Enzymes: No results for input(s): CKTOTAL, CKMB, CKMBINDEX, TROPONINI in the last 168 hours. BNP (last 3 results) No results for input(s): PROBNP in the last 8760 hours. CBG: No results for input(s): GLUCAP in the last 168 hours. D-Dimer: No results for input(s): DDIMER in the last 72 hours. Hgb A1c: No results for input(s): HGBA1C in the last 72 hours. Lipid Profile: No results for input(s): CHOL, HDL, LDLCALC, TRIG, CHOLHDL, LDLDIRECT in the last 72 hours. Thyroid function studies: No results for input(s): TSH, T4TOTAL, T3FREE, THYROIDAB in the last 72 hours.  Invalid input(s): FREET3 Anemia work up: No results for input(s): VITAMINB12, FOLATE, FERRITIN, TIBC, IRON, RETICCTPCT in the last 72 hours. Sepsis Labs: Recent Labs  Lab 06/13/18 1924 06/15/18 0513  WBC 8.1 1.8*   Microbiology No results found for this or any previous visit (from the past 240 hour(s)).   Medications:   . buPROPion  150 mg Oral q morning - 10a  . enoxaparin (LOVENOX) injection  30 mg Subcutaneous Q24H  . ipratropium-albuterol  3 mL  Nebulization TID  . labetalol  10 mg Intravenous Q8H  . nicotine  14 mg Transdermal Daily   Continuous Infusions: . dextrose 5 % and 0.45 % NaCl with KCl 20 mEq/L 100 mL/hr at 06/15/18 0406     LOS: 1 day   Charlynne Cousins  [facility]   *Please refer to [email or web address], password [email or web address] to get updated schedule on who will round on this patient, as hospitalists switch teams weekly. If 7PM-7AM, please contact night-coverage at [email or web address], password [email or web address] for any overnight needs.  06/15/2018, 7:51 AM

## 2018-06-16 ENCOUNTER — Ambulatory Visit: Payer: Self-pay | Admitting: Surgery

## 2018-06-16 DIAGNOSIS — I739 Peripheral vascular disease, unspecified: Secondary | ICD-10-CM

## 2018-06-16 LAB — CBC
HCT: 33.9 % — ABNORMAL LOW (ref 39.0–52.0)
Hemoglobin: 11.3 g/dL — ABNORMAL LOW (ref 13.0–17.0)
MCH: 32.8 pg (ref 26.0–34.0)
MCHC: 33.3 g/dL (ref 30.0–36.0)
MCV: 98.3 fL (ref 80.0–100.0)
Platelets: 174 10*3/uL (ref 150–400)
RBC: 3.45 MIL/uL — ABNORMAL LOW (ref 4.22–5.81)
RDW: 14.2 % (ref 11.5–15.5)
WBC: 1.7 10*3/uL — ABNORMAL LOW (ref 4.0–10.5)
nRBC: 0 % (ref 0.0–0.2)

## 2018-06-16 LAB — BASIC METABOLIC PANEL
Anion gap: 11 (ref 5–15)
BUN: 27 mg/dL — ABNORMAL HIGH (ref 8–23)
CO2: 26 mmol/L (ref 22–32)
Calcium: 8.8 mg/dL — ABNORMAL LOW (ref 8.9–10.3)
Chloride: 91 mmol/L — ABNORMAL LOW (ref 98–111)
Creatinine, Ser: 1.27 mg/dL — ABNORMAL HIGH (ref 0.61–1.24)
GFR calc Af Amer: >60 mL/min (ref >60)
GFR calc non Af Amer: 58 mL/min — ABNORMAL LOW (ref >60)
Glucose, Bld: 125 mg/dL — ABNORMAL HIGH (ref 70–99)
Potassium: 4.5 mmol/L (ref 3.5–5.1)
Sodium: 128 mmol/L — ABNORMAL LOW (ref 135–145)

## 2018-06-16 MED ORDER — SODIUM CHLORIDE 0.9 % IV BOLUS
500.0000 mL | Freq: Once | INTRAVENOUS | Status: AC
  Administered 2018-06-16: 500 mL via INTRAVENOUS

## 2018-06-16 MED ORDER — SODIUM CHLORIDE 0.9 % IV SOLN
INTRAVENOUS | Status: DC | PRN
  Administered 2018-06-16: 250 mL via INTRAVENOUS

## 2018-06-16 MED ORDER — METOPROLOL TARTRATE 5 MG/5ML IV SOLN
2.5000 mg | Freq: Two times a day (BID) | INTRAVENOUS | Status: DC | PRN
  Administered 2018-06-20: 2.5 mg via INTRAVENOUS
  Filled 2018-06-16: qty 5

## 2018-06-16 NOTE — Progress Notes (Addendum)
Patient was getting out of bed to go for a walk, upon standing his bed pad was saturated with blood. Patient was cleaned up and linens were changed. Will continue to monitor.   Respiratory was administering a breathing treatment and upon entering [name] room [name], [name] had removed his oxygen and his saturation had dropped to 77 this was immediately after I had left [name] room and placed [name] nasal cannula in his nose.

## 2018-06-16 NOTE — Consult Note (Addendum)
Reason for Consult:Cardiac risk stratification Referring Physician: [facility]  [name] is an 68 y.o. male.  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  Patient had an appt to see vascular surgeon Dr. Annamarie Major on 06/14/2018. However, patient had to be admitted on the same day to [facility] long hospital due to nausea, vomiting, abdominal pain and found to have small bowel obstruction due to left inguinal hernia. Plan is for operative repair of recurrent LIH with mesh. We were consulted for cardiac risk stratification.   At baseline, patient is on aspirin/plavix, rosuvastatin 20 mg daily, metoprolol succinate 50 mg daily, amlodipine 10 mg daily. It does not appear that he is on this medications during the hospital stay, owing to his NPO status due to SBO. He has been complaining of abdominal pain, but not chest pain. He has stable shortness of breath with worsening during the time he has had his abdominal complaints.   Past Medical History:  Diagnosis Date  . COPD (chronic obstructive pulmonary disease) ([place])   . Hypertension   . Iliac artery stenosis, right (HCC)    80% Stenosis  . 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[place])   . Lower limb ischemia 03/26/2018  . Lung cancer dx'd 12/2014  . Pancreatitis    x2 stents placed to make patent duct to pancreas  . Pulmonary hypertension ([place])   . Severe claudication ([place])   . Status post left foot surgery   . Tobacco abuse     Past Surgical History:  Procedure Laterality Date  . ABDOMINAL AORTOGRAM W/LOWER EXTREMITY N/A 03/30/2018   Procedure: ABDOMINAL AORTOGRAM W/LOWER EXTREMITY;  Surgeon: Nigel Mormon, MD;   Location: [facility];  Service: Cardiovascular;  Laterality: N/A;  . COLECTOMY    . COLOSTOMY    . COLOSTOMY REVERSAL    . EUS N/A 12/14/2014   Procedure: UPPER ENDOSCOPIC ULTRASOUND (EUS) LINEAR;  Surgeon: Milus Banister, MD;  Location: [facility];  Service: Endoscopy;  Laterality: N/A;  . FOOT SURGERY Left 2006  . PANCREAS SURGERY  2007  . PERIPHERAL VASCULAR INTERVENTION  03/30/2018   Procedure: PERIPHERAL VASCULAR INTERVENTION;  Surgeon: Nigel Mormon, MD;  Location: [facility];  Service: Cardiovascular;;  Rt Iliac  . UPPER GI ENDOSCOPY    . VENTRAL HERNIA REPAIR  1967    Family History  Problem Relation Age of Onset  . COPD Mother   . Diabetes Father   . Cancer Brother        metastatic, unknown primary  . Cancer Paternal Grandmother     Social History:  reports that he has been smoking cigarettes. He has a 25.00 pack-year smoking history. He has never used smokeless tobacco. He reports current alcohol use. He reports that he does not use drugs.  Allergies:  Allergies  Allergen Reactions  . Chantix [Varenicline] Other (See Comments)    Physically ill  . Prevacid [Lansoprazole] Other (See Comments)    "swell up with gas"    Medications: I have reviewed the patient's current medications.  Results for orders placed or performed during the hospital encounter of 06/13/18 (from the past 48 hour(s))  CBC     Status: Abnormal   Collection Time: 06/15/18  5:13  AM  Result Value Ref Range   WBC 1.8 (L) 4.0 - 10.5 K/uL   RBC 3.62 (L) 4.22 - 5.81 MIL/uL   Hemoglobin 11.7 (L) 13.0 - 17.0 g/dL   HCT 35.2 (L) 39.0 - 52.0 %   MCV 97.2 80.0 - 100.0 fL   MCH 32.3 26.0 - 34.0 pg   MCHC 33.2 30.0 - 36.0 g/dL   RDW 14.4 11.5 - 15.5 %   Platelets 194 150 - 400 K/uL   nRBC 0.0 0.0 - 0.2 %    Comment: Performed at [facility], [place] [address], [place], [place] [address]  Basic metabolic panel     Status: Abnormal   Collection Time: 06/15/18   5:13 AM  Result Value Ref Range   Sodium 132 (L) 135 - 145 mmol/L   Potassium 3.6 3.5 - 5.1 mmol/L   Chloride 93 (L) 98 - 111 mmol/L   CO2 29 22 - 32 mmol/L   Glucose, Bld 150 (H) 70 - 99 mg/dL   BUN 15 8 - 23 mg/dL   Creatinine, Ser 0.75 0.61 - 1.24 mg/dL   Calcium 8.8 (L) 8.9 - 10.3 mg/dL   GFR calc non Af Amer >60 >60 mL/min   GFR calc Af Amer >60 >60 mL/min   Anion gap 10 5 - 15    Comment: Performed at [facility], [place] [address], [place], [place] [address]  CBC     Status: Abnormal   Collection Time: 06/16/18  4:20 AM  Result Value Ref Range   WBC 1.7 (L) 4.0 - 10.5 K/uL   RBC 3.45 (L) 4.22 - 5.81 MIL/uL   Hemoglobin 11.3 (L) 13.0 - 17.0 g/dL   HCT 33.9 (L) 39.0 - 52.0 %   MCV 98.3 80.0 - 100.0 fL   MCH 32.8 26.0 - 34.0 pg   MCHC 33.3 30.0 - 36.0 g/dL   RDW 14.2 11.5 - 15.5 %   Platelets 174 150 - 400 K/uL   nRBC 0.0 0.0 - 0.2 %    Comment: Performed at [facility], [place] [address], [place], [place] [address]  Basic metabolic panel     Status: Abnormal   Collection Time: 06/16/18  4:20 AM  Result Value Ref Range   Sodium 128 (L) 135 - 145 mmol/L   Potassium 4.5 3.5 - 5.1 mmol/L    Comment: DELTA CHECK NOTED NO VISIBLE HEMOLYSIS    Chloride 91 (L) 98 - 111 mmol/L   CO2 26 22 - 32 mmol/L   Glucose, Bld 125 (H) 70 - 99 mg/dL   BUN 27 (H) 8 - 23 mg/dL   Creatinine, Ser 1.27 (H) 0.61 - 1.24 mg/dL   Calcium 8.8 (L) 8.9 - 10.3 mg/dL   GFR calc non Af Amer 58 (L) >60 mL/min   GFR calc Af Amer >60 >60 mL/min   Anion gap 11 5 - 15    Comment: Performed at [facility], [place] [address] [place], [place] [address]    Dg Chest Port 1 View  Result Date: 06/15/2018 CLINICAL DATA:  Increased shortness of breath, difficulty breathing this morning EXAM: PORTABLE CHEST 1 VIEW COMPARISON:  Portable exam 0941 hours compared to 05/18/2018 FINDINGS: Nasogastric tube coiled in stomach with tip at fundus. Normal heart size,  mediastinal contours, and pulmonary vascularity. Atherosclerotic calcification aorta. Emphysematous changes consistent with COPD. Scarring LEFT upper lobe. No acute infiltrate, pleural effusion or pneumothorax. Probable LEFT nipple shadow unchanged since earlier  study of 11/23/2017. Bones demineralized. And IMPRESSION: COPD changes with LEFT upper lobe scarring. No acute abnormalities. Electronically Signed   By: Lavonia Dana M.D.   On: 06/15/2018 10:13   Dg Abd Portable 1v  Result Date: 06/15/2018 CLINICAL DATA:  Small-bowel obstruction. EXAM: PORTABLE ABDOMEN - 1 VIEW COMPARISON:  06/14/2017.  CT 06/13/2018. FINDINGS: NG tube again noted coiled stomach in stable position. Persistent dilated loops of small bowel consistent with small bowel obstruction again noted. No interim change. Stool in the colon. No free air. Pancreatic calcifications again noted. Aortoiliac atherosclerotic vascular disease right iliac stents. Degenerative changes lumbar spine. IMPRESSION: 1.  NG tube noted coiled stomach in stable position. 2. Persistent dilated loops of small bowel consistent with small bowel obstruction again noted. No interim change. Electronically Signed   By: Marcello Moores  Register   On: 06/15/2018 05:17   Cardiac studies: Peripheral angiogram 03/30/2018: Mild distal abdominal aorta disease Focal Rt external iliac 80% stenosis Flush Rt SFA ostial to mid occlusion. Faint collaterals from Rt profunda to Rt SFA  Proximal Left EIA CTO extending into left distal SFA. Collaterals from Rt EIA to Lt SFA Three vessel below the knee runoff on right and two vessel below the knee runoff on left with extremely sluggish flow.   Attempted PTA to left external iliac artery Successful self expanding Eluvia 7.0 X 60 mm stent placement Rt external iliac artery  Recommendation: DAPT with aspirin and plavix for at least 4 weeks. Vascular surgery consult to consider Rt iliac/femoral to left profunda bypass graft  surgery Aggressive risk factor modification and blood pressure control  Echocardiogram 03/26/2018: LVEF 52%. Mild MR, mod TR. Mild PI. PASP 48 mmHg.   Nuclear stress test 03/22/2018; Normal myocardial perfusion imaging. EF 65%.  ABI 03/08/2018: Rt 0.75, Lt 0.39.  Review of Systems  Constitutional: Positive for weight loss.  HENT: Negative.   Respiratory: Positive for shortness of breath (Stable).   Cardiovascular: Negative for chest pain.  Gastrointestinal: Positive for abdominal pain, blood in stool (Soiling of the sheets with bright red blood noted today by RN/ Patient reports h/o hemorrhoids), nausea and vomiting.  Genitourinary: Negative.   Musculoskeletal: Negative.   Skin: Negative.   Neurological: Negative for loss of consciousness.  Endo/Heme/Allergies: Bruises/bleeds easily (As noted above).  Psychiatric/Behavioral: Negative.   All other systems reviewed and are negative.  Blood pressure (!) 152/95, pulse 95, temperature 97.9 F (36.6 C), temperature source Oral, resp. rate 16, height 5\' 2"  (1.575 m), weight 39.9 kg, SpO2 (!) 89 %. Physical Exam  Nursing note and vitals reviewed. Constitutional: He is oriented to person, place, and time.  Undernourished, cachectic  HENT:  Head: Normocephalic and atraumatic.  Eyes: Pupils are equal, round, and reactive to light. Conjunctivae are normal.  Neck: No JVD present.  Cardiovascular: Normal rate and regular rhythm.  No murmur heard. Pulses:      Femoral pulses are 3+ on the right side with bruit.      Popliteal pulses are 0 on the right side and 0 on the left side.       Dorsalis pedis pulses are 0 on the right side and 0 on the left side.       Posterior tibial pulses are 0 on the right side and 0 on the left side.  Left CFA pulse not palpable. Patient does not want me to press on left side due to pain   Respiratory: Effort normal and breath sounds normal. No respiratory distress. He has no  wheezes. He has no rales.   GI: He exhibits distension. There is abdominal tenderness.  Lymphadenopathy:    He has no cervical adenopathy.  Neurological: He is alert and oriented to person, place, and time.  Skin: Skin is warm and dry.  Bunyons and calloses on both feet. No deep ulcer/injury. Protective foam on b/l heels.  Psychiatric: He has a normal mood and affect.    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  Cardiac risk stratification: Normal stress test and LVEF in 03/2018. Currently, no chest pain. Baseline moderate cardiac risk due to his severe PAD and poor baseline functional status in an underweight patient.  Okay to proceed with surgery as planned, with acceptable cardiac risk.  Recommend baseline EKG. Okay to stop plavix, given large peripheral stent >2 months old. At baseline, he is on optimal medical therapy for hypertension, which he unfortunately has not been on due to his SBO and NPO status. Recommend metoprolol 2.5 mg q12 hours prn, if SBP >150 mmHg.   PAD: Management outpatient after recovery from SBO and upcoming hernia repair surgery. Vascular surgery consultation scheduled for 02/11 with Dr. Sherren Mocha Early.   ?bright red blood per rectum: Has h.o hemorrhoids. Defer further workup to surgical team.   Reynold Bowen Prerana Strayer 06/16/2018, 11:49 AM   [place], MD [facility] Pager: [phone] Office: [phone] If no answer Cell [phone]

## 2018-06-16 NOTE — Progress Notes (Signed)
Hospitalist consult PROGRESS NOTE  [name] WJX:[id number] DOB: [birth year] DOA: 06/13/2018 PCP: [name], MD  HPI/Recap of past 24 hours:  Ambulating with one person assist in hallway  Assessment/Plan: Principal Problem:   Inguinal hernia of left side with obstruction and without gangrene Active Problems:   Smoker   COPD (chronic obstructive pulmonary disease) ([place])   Essential hypertension   Protein-calorie malnutrition, severe ([place])   Severe claudication (HCC)  SBO 2/2 left inguinal hernia -s/p ng placement, npo/ivf -On general surgery service, Plavix held, plan for surgery after 5 days off Plavix -Plan per general surgery  H/o internal hemorrhoids -Seen by Dr. Thana Farr in April 2019, s/p rubber band ligation  -Blood per rectum this morning, denies abdominal pain -Defer to general surgery  COPD/heavy smoker -Not on home O2 -Likely will benefit O2 at discharge  Left upper lobe lung cancer  Lung nodules x2 left upper lobe and 1 left lower lobe s/p XRT -Case discussed with radiation oncology Dr. Tammi Klippel who stated patient has stage Ia status post curative intent treatment XRT finished in July 2019 -Per Dr. Tammi Klippel patient missed his restaging scan, Dr. Eber Jones recommended CT chest with IV contrast prior to discharge, and follow with him every 54-month.  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    Code Status: full  Family Communication: patient   Disposition Plan: not ready to discharge   Consultants:  General surgery  as primary  hospitalist as consultant  Cardiology as consultant  Procedures:  Ng placement  Antibiotics:  none   Objective: BP (!) 148/101 (BP Location: Left Arm)   Pulse 74   Temp 97.6 F (36.4 C) (Oral)   Resp 17   Ht 5\' 2"  (1.575 m)   Wt 39.9 kg   SpO2 98%   BMI 16.10 kg/m   Intake/Output Summary (Last 24 hours) at 06/16/2018 1600 Last data filed at 06/16/2018 1431 Gross per 24 hour  Intake 3386.72 ml  Output 925 ml  Net 2461.72 ml   Filed Weights   06/13/18 1849  Weight: 39.9 kg    Exam: Patient is examined daily including today on 06/16/2018, exams remain the same as of yesterday except that has changed    General:  Thin, frail, chronically ill appearing  Cardiovascular: RRR  Respiratory: very diminished  Abdomen: Soft, mildly distended,generalized tenderness, hypoactive  BS, + bilateral inguinal hernias  Musculoskeletal: No Edema  Neuro: alert, oriented   Data Reviewed: Basic Metabolic Panel: Recent Labs  Lab 06/13/18 1924 06/15/18 0513 06/16/18 0420  NA 133* 132* 128*  K 4.0 3.6 4.5  CL 89* 93* 91*  CO2 31 29 26   GLUCOSE 167* 150* 125*  BUN 15 15 27*  CREATININE 0.82 0.75 1.27*  CALCIUM 10.2 8.8* 8.8*   Liver Function Tests: Recent Labs  Lab 06/13/18 1924  AST 36  ALT 18  ALKPHOS 98  BILITOT 0.4  PROT 8.1  ALBUMIN 4.0   Recent Labs  Lab 06/13/18 1924  LIPASE 53*   No results for input(s): AMMONIA in the last 168 hours. CBC: Recent Labs  Lab  06/13/18 1924 06/15/18 0513 06/16/18 0420  WBC 8.1 1.8* 1.7*  HGB 14.1 11.7* 11.3*  HCT 40.9 35.2* 33.9*  MCV 95.3 97.2 98.3  PLT 241 194 174   Cardiac Enzymes:   No results for input(s): CKTOTAL, CKMB, CKMBINDEX, TROPONINI in the last 168 hours. BNP (last 3 results) No results for input(s): BNP in the last 8760 hours.  ProBNP (last 3 results) No results for input(s): PROBNP in the last 8760 hours.  CBG: No results for input(s): GLUCAP in the last 168 hours.  No results  found for this or any previous visit (from the past 240 hour(s)).   Studies: No results found.  Scheduled Meds: . buPROPion  150 mg Oral q morning - 10a  . enoxaparin (LOVENOX) injection  30 mg Subcutaneous Q24H  . ipratropium-albuterol  3 mL Nebulization TID  . labetalol  10 mg Intravenous Q8H  . nicotine  14 mg Transdermal Daily    Continuous Infusions: . sodium chloride 250 mL (06/16/18 1114)  . dextrose 5 % and 0.45 % NaCl with KCl 20 mEq/L 100 mL/hr at 06/16/18 0600  . methocarbamol (ROBAXIN) IV 500 mg (06/16/18 1112)     Time spent: 27mins I have personally reviewed and interpreted on  06/16/2018 daily labs,  imagings as discussed above under date review session and assessment and plans.  I reviewed all nursing notes, pharmacy notes, consultant notes,  vitals, pertinent old records  I have discussed plan of care as described above with RN , patient  on 06/16/2018   Florencia Reasons MD, PhD  [facility] Pager [phone]. If 7PM-7AM, please contact night-coverage at [email or web address], password [facility] 06/16/2018, 4:00 PM  LOS: 2 days

## 2018-06-16 NOTE — Progress Notes (Signed)
Patient O2 dropped overnight into the 70's and 80's. Patient did not have his nasal cannula in place. O2 came up to 90 once replaced. Patient is complaining with tightness in chest, respiratory administered breathing treatment and O2 was increased to 3L to maintain levels above 90. Will continue to monitor.

## 2018-06-16 NOTE — Progress Notes (Addendum)
Patient ID: [name], male   DOB: [birth year], 68 y.o.   MRN: [id number]       Subjective: Pt reports continued pain across his abdomen, no change from yesterday. No nausea. No flatus or BM. Reports shortness of breath and chest tightness throughout the night.   Objective: Vital signs in last 24 hours: Temp:  [97.9 F (36.6 C)-98.5 F (36.9 C)] 97.9 F (36.6 C) (01/08 0529) Pulse Rate:  [72-96] 95 (01/08 0529) Resp:  [16-18] 16 (01/08 0529) BP: (127-152)/(69-95) 152/95 (01/08 0529) SpO2:  [89 %-95 %] 89 % (01/08 0736) Last BM Date: 06/12/18  Intake/Output from previous day: 01/07 0701 - 01/08 0700 In: 2460.1 [P.O.:360; I.V.:2100.1] Out: 500 [Urine:250; Emesis/NG output:250] Intake/Output this shift: No intake/output data recorded.  PE: Heart: RRR Lungs: On 3L. Faint, end expiratory wheezing, R>L. No crackles Abd: Soft, mildly distended. Decreased BS. Generalized tenderness. No rigidity or guarding. Continued b/l inguinal hernias.   Lab Results:  Recent Labs    06/15/18 0513 06/16/18 0420  WBC 1.8* 1.7*  HGB 11.7* 11.3*  HCT 35.2* 33.9*  PLT 194 174   BMET Recent Labs    06/15/18 0513 06/16/18 0420  NA 132* 128*  K 3.6 4.5  CL 93* 91*  CO2 29 26  GLUCOSE 150* 125*  BUN 15 27*  CREATININE 0.75 1.27*  CALCIUM 8.8* 8.8*   PT/INR No results for input(s): LABPROT, INR in the last 72 hours. CMP     Component Value Date/Time   NA 128 (L) 06/16/2018 0420   NA 137 05/11/2017 1437   NA 133 (L) 10/06/2016 1220   K 4.5 06/16/2018 0420   K 4.7 10/06/2016 1220   CL 91 (L) 06/16/2018 0420   CO2 26 06/16/2018 0420   CO2 26 10/06/2016 1220   GLUCOSE 125 (H) 06/16/2018 0420   GLUCOSE 99 10/06/2016 1220   BUN 27 (H) 06/16/2018 0420   BUN 8 05/11/2017 1437   BUN 12.0 10/06/2016 1220   CREATININE 1.27 (H) 06/16/2018 0420   CREATININE 0.8 10/06/2016 1220   CALCIUM 8.8 (L) 06/16/2018 0420   CALCIUM 10.1 10/06/2016 1220   PROT 8.1 06/13/2018 1924   PROT 7.3  11/20/2014 1352   ALBUMIN 4.0 06/13/2018 1924   ALBUMIN 3.6 11/20/2014 1352   AST 36 06/13/2018 1924   AST 39 (H) 11/20/2014 1352   ALT 18 06/13/2018 1924   ALT 20 11/20/2014 1352   ALKPHOS 98 06/13/2018 1924   ALKPHOS 131 11/20/2014 1352   BILITOT 0.4 06/13/2018 1924   BILITOT 0.29 11/20/2014 1352   GFRNONAA 58 (L) 06/16/2018 0420   GFRAA >60 06/16/2018 0420   Lipase     Component Value Date/Time   LIPASE 53 (H) 06/13/2018 1924       Studies/Results: Dg Chest Port 1 View  Result Date: 06/15/2018 CLINICAL DATA:  Increased shortness of breath, difficulty breathing this morning EXAM: PORTABLE CHEST 1 VIEW COMPARISON:  Portable exam 0941 hours compared to 05/18/2018 FINDINGS: Nasogastric tube coiled in stomach with tip at fundus. Normal heart size, mediastinal contours, and pulmonary vascularity. Atherosclerotic calcification aorta. Emphysematous changes consistent with COPD. Scarring LEFT upper lobe. No acute infiltrate, pleural effusion or pneumothorax. Probable LEFT nipple shadow unchanged since earlier study of 11/23/2017. Bones demineralized. And IMPRESSION: COPD changes with LEFT upper lobe scarring. No acute abnormalities. Electronically Signed   By: Lavonia Dana M.D.   On: 06/15/2018 10:13   Dg Abd Portable 1v  Result Date: 06/15/2018 CLINICAL DATA:  Small-bowel obstruction.  EXAM: PORTABLE ABDOMEN - 1 VIEW COMPARISON:  06/14/2017.  CT 06/13/2018. FINDINGS: NG tube again noted coiled stomach in stable position. Persistent dilated loops of small bowel consistent with small bowel obstruction again noted. No interim change. Stool in the colon. No free air. Pancreatic calcifications again noted. Aortoiliac atherosclerotic vascular disease right iliac stents. Degenerative changes lumbar spine. IMPRESSION: 1.  NG tube noted coiled stomach in stable position. 2. Persistent dilated loops of small bowel consistent with small bowel obstruction again noted. No interim change. Electronically  Signed   By: Marcello Moores  Register   On: 06/15/2018 05:17    Anti-infectives: Anti-infectives (From admission, onward)   None       Assessment/Plan SBO 2/2 left inguinal hernia -Plan for operative repair of recurrent LIH with mesh when medically cleared and Plavix held for 5 days. Hopefully surgery on Friday, 06/18/2018 -NG tube in place. 216ml output overnight. 433ml in canister this morning. No flatus or BM. Continue to monitor I/O. -Mobilize & IS -Foley placed 2/2 bladder in right inguinal hernia on CT scan.  -Labs with continued leukopenia. Hgb stable. Will monitor. -Cr elevated this morning. Unclear etiology. Inp>Out. No nephrotoxic meds. Foley in place. Monitor & repeat labs in AM.  -Medicine and caridiology for medical clearance  HTN  -Per medicine  COPD -Nursing note reports hypoxia in the 70s and 80s overnight. Requiring 3L o2. Does not wear o2 at home.  -CXR yesterday with COPD changes. No PNA or edema.  -Per medicine  Hx of Lung CA s/p radiation  Per medicine  PVD s/p stenting of right leg -Per medicine and cardiology -Discussed on phone with Dr. Virgina Jock on 1/6. Okay to hold Plavix. Will reconsult today for their input and clearance.   Foley - In place FEN -NPO (except ice chips) VTE -Lovenox ID -None    LOS: 2 days    Jillyn Ledger , [facility] 06/16/2018, 8:27 AM Pager: [phone]

## 2018-06-17 ENCOUNTER — Inpatient Hospital Stay (HOSPITAL_COMMUNITY): Payer: Medicare Other

## 2018-06-17 LAB — CBC
HCT: 33.6 % — ABNORMAL LOW (ref 39.0–52.0)
Hemoglobin: 11.2 g/dL — ABNORMAL LOW (ref 13.0–17.0)
MCH: 32.7 pg (ref 26.0–34.0)
MCHC: 33.3 g/dL (ref 30.0–36.0)
MCV: 98.2 fL (ref 80.0–100.0)
Platelets: 205 10*3/uL (ref 150–400)
RBC: 3.42 MIL/uL — AB (ref 4.22–5.81)
RDW: 14 % (ref 11.5–15.5)
WBC: 2.6 10*3/uL — ABNORMAL LOW (ref 4.0–10.5)
nRBC: 0 % (ref 0.0–0.2)

## 2018-06-17 LAB — BASIC METABOLIC PANEL
Anion gap: 7 (ref 5–15)
BUN: 27 mg/dL — ABNORMAL HIGH (ref 8–23)
CO2: 27 mmol/L (ref 22–32)
Calcium: 8.7 mg/dL — ABNORMAL LOW (ref 8.9–10.3)
Chloride: 92 mmol/L — ABNORMAL LOW (ref 98–111)
Creatinine, Ser: 1.07 mg/dL (ref 0.61–1.24)
GFR calc Af Amer: >60 mL/min (ref >60)
GFR calc non Af Amer: >60 mL/min (ref >60)
Glucose, Bld: 160 mg/dL — ABNORMAL HIGH (ref 70–99)
Potassium: 4.6 mmol/L (ref 3.5–5.1)
SODIUM: 126 mmol/L — AB (ref 135–145)

## 2018-06-17 MED ORDER — ADULT MULTIVITAMIN W/MINERALS CH
1.0000 | ORAL_TABLET | Freq: Every day | ORAL | Status: DC
  Administered 2018-06-17 – 2018-06-22 (×4): 1 via ORAL
  Filled 2018-06-17 (×4): qty 1

## 2018-06-17 MED ORDER — VITAMIN B-1 100 MG PO TABS
100.0000 mg | ORAL_TABLET | Freq: Every day | ORAL | Status: DC
  Administered 2018-06-20 – 2018-06-22 (×3): 100 mg via ORAL
  Filled 2018-06-17 (×3): qty 1

## 2018-06-17 MED ORDER — SODIUM CHLORIDE 0.9 % IV SOLN
INTRAVENOUS | Status: AC
  Administered 2018-06-17 – 2018-06-18 (×2): via INTRAVENOUS

## 2018-06-17 MED ORDER — MAGNESIUM SULFATE IN D5W 1-5 GM/100ML-% IV SOLN
1.0000 g | Freq: Once | INTRAVENOUS | Status: AC
  Administered 2018-06-17: 1 g via INTRAVENOUS
  Filled 2018-06-17: qty 100

## 2018-06-17 MED ORDER — HYDROCORTISONE ACE-PRAMOXINE 2.5-1 % RE CREA
TOPICAL_CREAM | Freq: Four times a day (QID) | RECTAL | Status: DC
  Administered 2018-06-17 (×2): 1 via RECTAL
  Administered 2018-06-17 – 2018-06-21 (×9): via RECTAL
  Administered 2018-06-21 – 2018-06-22 (×2): 1 via RECTAL
  Administered 2018-06-22 – 2018-06-23 (×2): via RECTAL
  Administered 2018-06-23: 1 via RECTAL
  Administered 2018-06-23: 11:00:00 via RECTAL
  Administered 2018-06-24: 1 via RECTAL
  Administered 2018-06-25 – 2018-06-26 (×4): via RECTAL
  Administered 2018-06-26: 1 via RECTAL
  Administered 2018-06-26 – 2018-06-30 (×13): via RECTAL
  Administered 2018-06-30 – 2018-07-01 (×3): 1 via RECTAL
  Administered 2018-07-02 – 2018-07-03 (×7): via RECTAL
  Administered 2018-07-03 – 2018-07-04 (×2): 1 via RECTAL
  Administered 2018-07-04 – 2018-07-05 (×4): via RECTAL
  Administered 2018-07-05: 1 via RECTAL
  Administered 2018-07-05 – 2018-07-11 (×14): via RECTAL
  Administered 2018-07-11: 1 via RECTAL
  Administered 2018-07-11 – 2018-07-12 (×2): via RECTAL
  Filled 2018-06-17 (×4): qty 30

## 2018-06-17 MED ORDER — THIAMINE HCL 100 MG/ML IJ SOLN
100.0000 mg | Freq: Every day | INTRAMUSCULAR | Status: DC
  Administered 2018-06-17 – 2018-06-19 (×2): 100 mg via INTRAVENOUS
  Filled 2018-06-17 (×2): qty 2

## 2018-06-17 MED ORDER — LORAZEPAM 2 MG/ML IJ SOLN
1.0000 mg | Freq: Four times a day (QID) | INTRAMUSCULAR | Status: DC | PRN
  Administered 2018-06-18 – 2018-06-19 (×2): 1 mg via INTRAVENOUS
  Filled 2018-06-17 (×2): qty 1

## 2018-06-17 MED ORDER — FOLIC ACID 1 MG PO TABS
1.0000 mg | ORAL_TABLET | Freq: Every day | ORAL | Status: DC
  Administered 2018-06-17 – 2018-06-22 (×4): 1 mg via ORAL
  Filled 2018-06-17 (×4): qty 1

## 2018-06-17 MED ORDER — LORAZEPAM 1 MG PO TABS: 1.0000 mg | ORAL_TABLET | Freq: Four times a day (QID) | ORAL | Status: DC | PRN

## 2018-06-17 NOTE — Progress Notes (Signed)
Hospitalist consult PROGRESS NOTE  [name] KZL:[id number] DOB: [birth year] DOA: 06/13/2018 PCP: [name], MD  HPI/Recap of past 24 hours:  He is little confused, family concerned about alcohol withdrawal He pulled out hi NG tube Abdomen is distended and tight, hypoactive bowel sounds He report belching, no vomiting, he has been npo since hospitalization Ambulating with one person to the bathroom, o2 dropped to the 70's on room air  Assessment/Plan: Principal Problem:   Inguinal hernia of left side with obstruction and without gangrene Active Problems:   Smoker   COPD (chronic obstructive pulmonary disease) ([place])   Essential hypertension   Protein-calorie malnutrition, severe ([place])   Severe claudication (HCC)   PAD (peripheral artery disease) ([place])  SBO 2/2 left inguinal hernia -s/p ng placement, npo/ivf -On general surgery service, Plavix held, plan for surgery after 5 days off Plavix -Plan per general surgery  H/o internal hemorrhoids -Seen by Dr. Thana Farr in April 2019, s/p rubber band ligation  -Blood per rectum this morning, denies abdominal pain -Defer to general surgery  Hyponatremia: D/c d51/2saline, change to normal saline, repeat bmp in am  Acute metabolic encephalopathy: Likely multifactorial , alcohol withdrawal? Hyponatremia, dehydration, ongoing bowel obstruction ivf changed to ns Start ciwa protocol  COPD/heavy smoker -Not on home O2 -Likely will benefit O2 at discharge  Left upper lobe lung cancer  Lung nodules x2 left upper lobe and 1 left lower lobe s/p XRT -Case discussed with radiation oncology Dr. Tammi Klippel who stated patient has stage Ia status post curative intent treatment XRT finished in July 2019 -Per Dr. Tammi Klippel patient missed his restaging scan, Dr. Eber Jones recommended CT chest with IV contrast prior to discharge, and follow with him every 24-month.  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    Code Status: full  Family Communication: patient   Disposition Plan: not ready to discharge   Consultants:  General surgery as primary  hospitalist as consultant  Cardiology as consultant  Procedures:  Ng placement  Antibiotics:  none   Objective: BP (!) 144/97 (BP Location: Right Arm)   Pulse 94   Temp (!) 97.5 F (36.4 C) (Oral)   Resp 17   Ht 5\' 2"  (1.575 m)   Wt 39.9 kg   SpO2 96%   BMI 16.10 kg/m   Intake/Output Summary (Last 24 hours) at 06/17/2018 1502 Last data filed at 06/17/2018 1400 Gross per 24 hour  Intake 2419.26 ml  Output 975 ml  Net 1444.26 ml   Filed Weights   06/13/18 1849  Weight: 39.9 kg    Exam: Patient is examined daily including today on 06/17/2018, exams remain the same as of yesterday except that has changed    General:  Thin, frail, chronically ill appearing, NAD, slightly confused but able to self correct  Cardiovascular: RRR  Respiratory: very diminished  Abdomen: distended and tight, diffuse mild ternderess, hypoactive  BS, + bilateral inguinal hernias  Musculoskeletal: No Edema  Neuro: alert, slight confusion, but able to self correct  Data Reviewed: Basic Metabolic Panel: Recent Labs  Lab 06/13/18 1924 06/15/18 0513 06/16/18  0420 06/17/18 0827  NA 133* 132* 128* 126*  K 4.0 3.6 4.5 4.6  CL 89* 93* 91* 92*  CO2 31 29 26 27   GLUCOSE 167* 150* 125* 160*  BUN 15 15 27* 27*  CREATININE 0.82 0.75 1.27* 1.07  CALCIUM 10.2 8.8* 8.8* 8.7*   Liver Function Tests: Recent Labs  Lab 06/13/18 1924   AST 36  ALT 18  ALKPHOS 98  BILITOT 0.4  PROT 8.1  ALBUMIN 4.0   Recent Labs  Lab 06/13/18 1924  LIPASE 53*   No results for input(s): AMMONIA in the last 168 hours. CBC: Recent Labs  Lab 06/13/18 1924 06/15/18 0513 06/16/18 0420 06/17/18 0827  WBC 8.1 1.8* 1.7* 2.6*  HGB 14.1 11.7* 11.3* 11.2*  HCT 40.9 35.2* 33.9* 33.6*  MCV 95.3 97.2 98.3 98.2  PLT 241 194 174 205   Cardiac Enzymes:   No results for input(s): CKTOTAL, CKMB, CKMBINDEX, TROPONINI in the last 168 hours. BNP (last 3 results) No results for input(s): BNP in the last 8760 hours.  ProBNP (last 3 results) No results for input(s): PROBNP in the last 8760 hours.  CBG: No results for input(s): GLUCAP in the last 168 hours.  No results found for this or any previous visit (from the past 240 hour(s)).   Studies: No results found.  Scheduled Meds: . buPROPion  150 mg Oral q morning - 10a  . enoxaparin (LOVENOX) injection  30 mg Subcutaneous Q24H  . hydrocortisone-pramoxine   Rectal QID  . ipratropium-albuterol  3 mL Nebulization TID  . labetalol  10 mg Intravenous Q8H  . nicotine  14 mg Transdermal Daily    Continuous Infusions: . sodium chloride 250 mL (06/16/18 1114)  . sodium chloride    . methocarbamol (ROBAXIN) IV 500 mg (06/16/18 2304)     Time spent: 38mins I have personally reviewed and interpreted on  06/17/2018 daily labs,  imagings as discussed above under date review session and assessment and plans.  I reviewed all nursing notes, pharmacy notes, consultant notes,  vitals, pertinent old records  I have discussed plan of care as described above with RN , patient  on 06/17/2018   Florencia Reasons MD, PhD  [facility] Pager [phone]. If 7PM-7AM, please contact night-coverage at [email or web address], password [facility] 06/17/2018, 3:02 PM  LOS: 3 days

## 2018-06-17 NOTE — H&P (View-Only) (Signed)
Patient ID: [name], male   DOB: [birth year], 68 y.o.   MRN: [id number]       Subjective: Pt reports he pulled his NG tube yesterday because it was making him feel SOB. Reports he feels better currently. Minimal nausea. No emesis. Complains of continued pain in his abdomen, improved from yesterday. No flatus or BM. Nurse reports yesterday that when he went to ambulate yesterday there was blood and small amount of stool on his sheets. Has hx of internal hemorrhoids and s/p rubber band ligation in 2019. He is ambulating and using IS.   Objective: Vital signs in last 24 hours: Temp:  [97.6 F (36.4 C)-98.8 F (37.1 C)] 98.2 F (36.8 C) (01/09 1607) Pulse Rate:  [74-93] 90 (01/09 0613) Resp:  [14-17] 14 (01/09 0613) BP: (147-149)/(89-101) 147/89 (01/09 0613) SpO2:  [77 %-98 %] 94 % (01/09 0805) Last BM Date: 06/12/18  Intake/Output from previous day: 01/08 0701 - 01/09 0700 In: 3163 [P.O.:340; I.V.:2223; IV Piggyback:600] Out: 1200 [Urine:600; Emesis/NG output:600] Intake/Output this shift: No intake/output data recorded.  PE: Heart: RRR Lungs: CTA b/l Abd: Soft, mild distension. Generalized tenderness. +BS. No r/r/g. B/l inguinal hernias.  GU: RN chaperone present. Patient with right sided prolapsed internal hemorrhoid that is reducible.   Lab Results:  Recent Labs    06/15/18 0513 06/16/18 0420  WBC 1.8* 1.7*  HGB 11.7* 11.3*  HCT 35.2* 33.9*  PLT 194 174   BMET Recent Labs    06/15/18 0513 06/16/18 0420  NA 132* 128*  K 3.6 4.5  CL 93* 91*  CO2 29 26  GLUCOSE 150* 125*  BUN 15 27*  CREATININE 0.75 1.27*  CALCIUM 8.8* 8.8*   PT/INR No results for input(s): LABPROT, INR in the last 72 hours. CMP     Component Value Date/Time   NA 128 (L) 06/16/2018 0420   NA 137 05/11/2017 1437   NA 133 (L) 10/06/2016 1220   K 4.5 06/16/2018 0420   K 4.7 10/06/2016 1220   CL 91 (L) 06/16/2018 0420   CO2 26 06/16/2018 0420   CO2 26 10/06/2016 1220   GLUCOSE 125  (H) 06/16/2018 0420   GLUCOSE 99 10/06/2016 1220   BUN 27 (H) 06/16/2018 0420   BUN 8 05/11/2017 1437   BUN 12.0 10/06/2016 1220   CREATININE 1.27 (H) 06/16/2018 0420   CREATININE 0.8 10/06/2016 1220   CALCIUM 8.8 (L) 06/16/2018 0420   CALCIUM 10.1 10/06/2016 1220   PROT 8.1 06/13/2018 1924   PROT 7.3 11/20/2014 1352   ALBUMIN 4.0 06/13/2018 1924   ALBUMIN 3.6 11/20/2014 1352   AST 36 06/13/2018 1924   AST 39 (H) 11/20/2014 1352   ALT 18 06/13/2018 1924   ALT 20 11/20/2014 1352   ALKPHOS 98 06/13/2018 1924   ALKPHOS 131 11/20/2014 1352   BILITOT 0.4 06/13/2018 1924   BILITOT 0.29 11/20/2014 1352   GFRNONAA 58 (L) 06/16/2018 0420   GFRAA >60 06/16/2018 0420   Lipase     Component Value Date/Time   LIPASE 53 (H) 06/13/2018 1924       Studies/Results: Dg Chest Port 1 View  Result Date: 06/15/2018 CLINICAL DATA:  Increased shortness of breath, difficulty breathing this morning EXAM: PORTABLE CHEST 1 VIEW COMPARISON:  Portable exam 0941 hours compared to 05/18/2018 FINDINGS: Nasogastric tube coiled in stomach with tip at fundus. Normal heart size, mediastinal contours, and pulmonary vascularity. Atherosclerotic calcification aorta. Emphysematous changes consistent with COPD. Scarring LEFT upper lobe. No acute infiltrate, pleural  effusion or pneumothorax. Probable LEFT nipple shadow unchanged since earlier study of 11/23/2017. Bones demineralized. And IMPRESSION: COPD changes with LEFT upper lobe scarring. No acute abnormalities. Electronically Signed   By: Lavonia Dana M.D.   On: 06/15/2018 10:13    Anti-infectives: Anti-infectives (From admission, onward)   None       Assessment/Plan SBO 2/2 left inguinal hernia -Pt posted for open recurrent LIH with mesh for Friday, 06/18/2018 -Continue to hold Plavix -Patient cleared by cardiology and medicine.  -Re-insert NG tube if pt develops N/V -Mobilize & IS -Foley placed 2/2 bladder in right inguinal hernia on CT  scan. -Elevated Cr and leukopenia on yesterdays labs. Today's labs pending. Monitor.  Internal hemorrhoids -Analpram-HC 2.5% QID externally -Sitz baths -Hgb stable  -F/u outpatient   HTN  -Per medicine  COPD -Per medicine  Hx of Lung CA s/p radiation -Per medicine  PVD s/p stenting of right leg -Per medicine and cardiology -F/u with Dr. Donnetta Hutching on 2/11   Foley- In place FEN -NPO (except ice chips) VTE -Lovenox ID - None, Ancef pre-surg   LOS: 3 days    Jillyn Ledger , [facility] 06/17/2018, 8:36 AM Pager: [phone]

## 2018-06-17 NOTE — Progress Notes (Signed)
Mr. [name], in 1525,in with SBO, and enlarged inguinal hernia  removed his NGT on his own, stated he's tired of it and it has been  In Long enough.  He stated he does not want it.

## 2018-06-17 NOTE — Progress Notes (Signed)
Patient ambulated to bathroom for SITZ bath without oxygen, O2 levels dropped to 77. Oxygen was applied by nasal cannula 3L and patient O2 rate is now steady in the low 90's.  Patients significant other called this morning and informed me that "his increased confusion may be a result of alcohol withdrawal"

## 2018-06-17 NOTE — Progress Notes (Signed)
Patient ID: [name], male   DOB: [birth year], 68 y.o.   MRN: [id number]       Subjective: Pt reports he pulled his NG tube yesterday because it was making him feel SOB. Reports he feels better currently. Minimal nausea. No emesis. Complains of continued pain in his abdomen, improved from yesterday. No flatus or BM. Nurse reports yesterday that when he went to ambulate yesterday there was blood and small amount of stool on his sheets. Has hx of internal hemorrhoids and s/p rubber band ligation in 2019. He is ambulating and using IS.   Objective: Vital signs in last 24 hours: Temp:  [97.6 F (36.4 C)-98.8 F (37.1 C)] 98.2 F (36.8 C) (01/09 2683) Pulse Rate:  [74-93] 90 (01/09 0613) Resp:  [14-17] 14 (01/09 0613) BP: (147-149)/(89-101) 147/89 (01/09 0613) SpO2:  [77 %-98 %] 94 % (01/09 0805) Last BM Date: 06/12/18  Intake/Output from previous day: 01/08 0701 - 01/09 0700 In: 3163 [P.O.:340; I.V.:2223; IV Piggyback:600] Out: 1200 [Urine:600; Emesis/NG output:600] Intake/Output this shift: No intake/output data recorded.  PE: Heart: RRR Lungs: CTA b/l Abd: Soft, mild distension. Generalized tenderness. +BS. No r/r/g. B/l inguinal hernias.  GU: RN chaperone present. Patient with right sided prolapsed internal hemorrhoid that is reducible.   Lab Results:  Recent Labs    06/15/18 0513 06/16/18 0420  WBC 1.8* 1.7*  HGB 11.7* 11.3*  HCT 35.2* 33.9*  PLT 194 174   BMET Recent Labs    06/15/18 0513 06/16/18 0420  NA 132* 128*  K 3.6 4.5  CL 93* 91*  CO2 29 26  GLUCOSE 150* 125*  BUN 15 27*  CREATININE 0.75 1.27*  CALCIUM 8.8* 8.8*   PT/INR No results for input(s): LABPROT, INR in the last 72 hours. CMP     Component Value Date/Time   NA 128 (L) 06/16/2018 0420   NA 137 05/11/2017 1437   NA 133 (L) 10/06/2016 1220   K 4.5 06/16/2018 0420   K 4.7 10/06/2016 1220   CL 91 (L) 06/16/2018 0420   CO2 26 06/16/2018 0420   CO2 26 10/06/2016 1220   GLUCOSE 125  (H) 06/16/2018 0420   GLUCOSE 99 10/06/2016 1220   BUN 27 (H) 06/16/2018 0420   BUN 8 05/11/2017 1437   BUN 12.0 10/06/2016 1220   CREATININE 1.27 (H) 06/16/2018 0420   CREATININE 0.8 10/06/2016 1220   CALCIUM 8.8 (L) 06/16/2018 0420   CALCIUM 10.1 10/06/2016 1220   PROT 8.1 06/13/2018 1924   PROT 7.3 11/20/2014 1352   ALBUMIN 4.0 06/13/2018 1924   ALBUMIN 3.6 11/20/2014 1352   AST 36 06/13/2018 1924   AST 39 (H) 11/20/2014 1352   ALT 18 06/13/2018 1924   ALT 20 11/20/2014 1352   ALKPHOS 98 06/13/2018 1924   ALKPHOS 131 11/20/2014 1352   BILITOT 0.4 06/13/2018 1924   BILITOT 0.29 11/20/2014 1352   GFRNONAA 58 (L) 06/16/2018 0420   GFRAA >60 06/16/2018 0420   Lipase     Component Value Date/Time   LIPASE 53 (H) 06/13/2018 1924       Studies/Results: Dg Chest Port 1 View  Result Date: 06/15/2018 CLINICAL DATA:  Increased shortness of breath, difficulty breathing this morning EXAM: PORTABLE CHEST 1 VIEW COMPARISON:  Portable exam 0941 hours compared to 05/18/2018 FINDINGS: Nasogastric tube coiled in stomach with tip at fundus. Normal heart size, mediastinal contours, and pulmonary vascularity. Atherosclerotic calcification aorta. Emphysematous changes consistent with COPD. Scarring LEFT upper lobe. No acute infiltrate, pleural  effusion or pneumothorax. Probable LEFT nipple shadow unchanged since earlier study of 11/23/2017. Bones demineralized. And IMPRESSION: COPD changes with LEFT upper lobe scarring. No acute abnormalities. Electronically Signed   By: Lavonia Dana M.D.   On: 06/15/2018 10:13    Anti-infectives: Anti-infectives (From admission, onward)   None       Assessment/Plan SBO 2/2 left inguinal hernia -Pt posted for open recurrent LIH with mesh for Friday, 06/18/2018 -Continue to hold Plavix -Patient cleared by cardiology and medicine.  -Re-insert NG tube if pt develops N/V -Mobilize & IS -Foley placed 2/2 bladder in right inguinal hernia on CT  scan. -Elevated Cr and leukopenia on yesterdays labs. Today's labs pending. Monitor.  Internal hemorrhoids -Analpram-HC 2.5% QID externally -Sitz baths -Hgb stable  -F/u outpatient   HTN  -Per medicine  COPD -Per medicine  Hx of Lung CA s/p radiation -Per medicine  PVD s/p stenting of right leg -Per medicine and cardiology -F/u with Dr. Donnetta Hutching on 2/11   Foley- In place FEN -NPO (except ice chips) VTE -Lovenox ID - None, Ancef pre-surg   LOS: 3 days    Jillyn Ledger , [facility] 06/17/2018, 8:36 AM Pager: [phone]

## 2018-06-18 ENCOUNTER — Inpatient Hospital Stay (HOSPITAL_COMMUNITY): Payer: Medicare Other | Admitting: Certified Registered Nurse Anesthetist

## 2018-06-18 ENCOUNTER — Encounter (HOSPITAL_COMMUNITY): Payer: Self-pay | Admitting: Certified Registered Nurse Anesthetist

## 2018-06-18 ENCOUNTER — Inpatient Hospital Stay (HOSPITAL_COMMUNITY): Payer: Medicare Other

## 2018-06-18 ENCOUNTER — Encounter (HOSPITAL_COMMUNITY): Admission: EM | Disposition: A | Discharge status: Home Health | Payer: Self-pay | Source: Home / Self Care

## 2018-06-18 DIAGNOSIS — K4030 Unilateral inguinal hernia, with obstruction, without gangrene, not specified as recurrent: Secondary | ICD-10-CM

## 2018-06-18 DIAGNOSIS — J9601 Acute respiratory failure with hypoxia: Secondary | ICD-10-CM

## 2018-06-18 DIAGNOSIS — K56609 Unspecified intestinal obstruction, unspecified as to partial versus complete obstruction: Secondary | ICD-10-CM

## 2018-06-18 DIAGNOSIS — G9341 Metabolic encephalopathy: Secondary | ICD-10-CM

## 2018-06-18 HISTORY — PX: INGUINAL HERNIA REPAIR: SHX194

## 2018-06-18 HISTORY — PX: INSERTION OF MESH: SHX5868

## 2018-06-18 LAB — BASIC METABOLIC PANEL
Anion gap: 11 (ref 5–15)
BUN: 31 mg/dL — ABNORMAL HIGH (ref 8–23)
CHLORIDE: 94 mmol/L — AB (ref 98–111)
CO2: 27 mmol/L (ref 22–32)
Calcium: 8.9 mg/dL (ref 8.9–10.3)
Creatinine, Ser: 1.08 mg/dL (ref 0.61–1.24)
GFR calc Af Amer: >60 mL/min (ref >60)
GFR calc non Af Amer: >60 mL/min (ref >60)
Glucose, Bld: 95 mg/dL (ref 70–99)
Potassium: 5.1 mmol/L (ref 3.5–5.1)
SODIUM: 132 mmol/L — AB (ref 135–145)

## 2018-06-18 LAB — GLUCOSE, CAPILLARY: Glucose-Capillary: 115 mg/dL — ABNORMAL HIGH (ref 70–99)

## 2018-06-18 LAB — CBC WITH DIFFERENTIAL/PLATELET
Abs Immature Granulocytes: 0.03 10*3/uL (ref 0.00–0.07)
Basophils Absolute: 0 10*3/uL (ref 0.0–0.1)
Basophils Relative: 1 %
Eosinophils Absolute: 0 10*3/uL (ref 0.0–0.5)
Eosinophils Relative: 0 %
HCT: 34.3 % — ABNORMAL LOW (ref 39.0–52.0)
HEMOGLOBIN: 11.5 g/dL — AB (ref 13.0–17.0)
Immature Granulocytes: 1 %
LYMPHS ABS: 0.4 10*3/uL — AB (ref 0.7–4.0)
Lymphocytes Relative: 10 %
MCH: 31.8 pg (ref 26.0–34.0)
MCHC: 33.5 g/dL (ref 30.0–36.0)
MCV: 94.8 fL (ref 80.0–100.0)
Monocytes Absolute: 0.7 10*3/uL (ref 0.1–1.0)
Monocytes Relative: 16 %
Neutro Abs: 3.2 10*3/uL (ref 1.7–7.7)
Neutrophils Relative %: 72 %
Platelets: 252 10*3/uL (ref 150–400)
RBC: 3.62 MIL/uL — ABNORMAL LOW (ref 4.22–5.81)
RDW: 13.9 % (ref 11.5–15.5)
WBC: 4.4 10*3/uL (ref 4.0–10.5)
nRBC: 0 % (ref 0.0–0.2)

## 2018-06-18 LAB — SURGICAL PCR SCREEN
MRSA, PCR: NEGATIVE
Staphylococcus aureus: NEGATIVE

## 2018-06-18 LAB — MAGNESIUM: MAGNESIUM: 1.8 mg/dL (ref 1.7–2.4)

## 2018-06-18 SURGERY — REPAIR, HERNIA, INGUINAL, ADULT
Anesthesia: General | Site: Inguinal | Laterality: Left

## 2018-06-18 MED ORDER — ENOXAPARIN SODIUM 40 MG/0.4ML ~~LOC~~ SOLN: 40.0000 mg | SUBCUTANEOUS | Status: DC

## 2018-06-18 MED ORDER — MUPIROCIN 2 % EX OINT
1.0000 "application " | TOPICAL_OINTMENT | Freq: Two times a day (BID) | CUTANEOUS | Status: AC
  Administered 2018-06-18 – 2018-06-21 (×6): 1 via NASAL
  Filled 2018-06-18 (×2): qty 22

## 2018-06-18 MED ORDER — CHLORHEXIDINE GLUCONATE CLOTH 2 % EX PADS: 6.0000 | MEDICATED_PAD | Freq: Every day | CUTANEOUS | Status: DC

## 2018-06-18 MED ORDER — BUDESONIDE 0.5 MG/2ML IN SUSP
0.5000 mg | Freq: Two times a day (BID) | RESPIRATORY_TRACT | Status: DC
  Administered 2018-06-18 – 2018-07-12 (×48): 0.5 mg via RESPIRATORY_TRACT
  Filled 2018-06-18 (×48): qty 2

## 2018-06-18 MED ORDER — BUDESONIDE 0.25 MG/2ML IN SUSP: 0.2500 mg | Freq: Two times a day (BID) | RESPIRATORY_TRACT | Status: DC

## 2018-06-18 MED ORDER — FUROSEMIDE 10 MG/ML IJ SOLN
20.0000 mg | Freq: Once | INTRAMUSCULAR | Status: AC
  Administered 2018-06-18: 20 mg via INTRAVENOUS
  Filled 2018-06-18: qty 2

## 2018-06-18 MED ORDER — ONDANSETRON HCL 4 MG/2ML IJ SOLN
INTRAMUSCULAR | Status: AC
  Filled 2018-06-18: qty 2

## 2018-06-18 MED ORDER — MIDAZOLAM HCL 5 MG/5ML IJ SOLN
INTRAMUSCULAR | Status: DC | PRN
  Administered 2018-06-18: 2 mg via INTRAVENOUS

## 2018-06-18 MED ORDER — ACETAMINOPHEN 325 MG PO TABS
650.0000 mg | ORAL_TABLET | Freq: Four times a day (QID) | ORAL | Status: DC | PRN
  Administered 2018-06-19 – 2018-06-30 (×11): 650 mg via ORAL
  Filled 2018-06-18 (×11): qty 2

## 2018-06-18 MED ORDER — BUPIVACAINE HCL (PF) 0.25 % IJ SOLN
INTRAMUSCULAR | Status: DC | PRN
  Administered 2018-06-18: 20 mL

## 2018-06-18 MED ORDER — LIDOCAINE 2% (20 MG/ML) 5 ML SYRINGE
INTRAMUSCULAR | Status: DC | PRN
  Administered 2018-06-18: 60 mg via INTRAVENOUS

## 2018-06-18 MED ORDER — MIDAZOLAM HCL 2 MG/2ML IJ SOLN
INTRAMUSCULAR | Status: AC
  Filled 2018-06-18: qty 2

## 2018-06-18 MED ORDER — ONDANSETRON 4 MG PO TBDP: 4.0000 mg | ORAL_TABLET | Freq: Four times a day (QID) | ORAL | Status: DC | PRN

## 2018-06-18 MED ORDER — PROPOFOL 10 MG/ML IV BOLUS
INTRAVENOUS | Status: AC
  Filled 2018-06-18: qty 20

## 2018-06-18 MED ORDER — SUGAMMADEX SODIUM 200 MG/2ML IV SOLN
INTRAVENOUS | Status: DC | PRN
  Administered 2018-06-18: 8 mg via INTRAVENOUS

## 2018-06-18 MED ORDER — CHLORHEXIDINE GLUCONATE CLOTH 2 % EX PADS
6.0000 | MEDICATED_PAD | Freq: Once | CUTANEOUS | Status: AC
  Administered 2018-06-18: 6 via TOPICAL

## 2018-06-18 MED ORDER — LIDOCAINE 2% (20 MG/ML) 5 ML SYRINGE
INTRAMUSCULAR | Status: AC
  Filled 2018-06-18: qty 5

## 2018-06-18 MED ORDER — FENTANYL CITRATE (PF) 100 MCG/2ML IJ SOLN
25.0000 ug | INTRAMUSCULAR | Status: DC | PRN
  Administered 2018-06-18: 50 ug via INTRAVENOUS

## 2018-06-18 MED ORDER — ONDANSETRON HCL 4 MG/2ML IJ SOLN
4.0000 mg | Freq: Four times a day (QID) | INTRAMUSCULAR | Status: DC | PRN
  Administered 2018-06-29: 4 mg via INTRAVENOUS
  Filled 2018-06-18: qty 2

## 2018-06-18 MED ORDER — ENOXAPARIN SODIUM 30 MG/0.3ML ~~LOC~~ SOLN
30.0000 mg | SUBCUTANEOUS | Status: DC
  Administered 2018-06-19 – 2018-06-30 (×12): 30 mg via SUBCUTANEOUS
  Filled 2018-06-18 (×12): qty 0.3

## 2018-06-18 MED ORDER — SUCCINYLCHOLINE CHLORIDE 200 MG/10ML IV SOSY
PREFILLED_SYRINGE | INTRAVENOUS | Status: DC | PRN
  Administered 2018-06-18: 100 mg via INTRAVENOUS

## 2018-06-18 MED ORDER — ORAL CARE MOUTH RINSE
15.0000 mL | Freq: Two times a day (BID) | OROMUCOSAL | Status: DC
  Administered 2018-06-21 – 2018-06-29 (×16): 15 mL via OROMUCOSAL

## 2018-06-18 MED ORDER — DEXAMETHASONE SODIUM PHOSPHATE 10 MG/ML IJ SOLN
INTRAMUSCULAR | Status: AC
  Filled 2018-06-18: qty 1

## 2018-06-18 MED ORDER — CEFAZOLIN SODIUM-DEXTROSE 2-4 GM/100ML-% IV SOLN
2.0000 g | INTRAVENOUS | Status: DC
  Filled 2018-06-18 (×2): qty 100

## 2018-06-18 MED ORDER — ROCURONIUM BROMIDE 10 MG/ML (PF) SYRINGE
PREFILLED_SYRINGE | INTRAVENOUS | Status: AC
  Filled 2018-06-18: qty 10

## 2018-06-18 MED ORDER — ROCURONIUM BROMIDE 10 MG/ML (PF) SYRINGE
PREFILLED_SYRINGE | INTRAVENOUS | Status: DC | PRN
  Administered 2018-06-18: 40 mg via INTRAVENOUS

## 2018-06-18 MED ORDER — FENTANYL CITRATE (PF) 250 MCG/5ML IJ SOLN
INTRAMUSCULAR | Status: DC | PRN
  Administered 2018-06-18 (×2): 50 ug via INTRAVENOUS

## 2018-06-18 MED ORDER — KCL IN DEXTROSE-NACL 20-5-0.45 MEQ/L-%-% IV SOLN
INTRAVENOUS | Status: DC
  Administered 2018-06-18 – 2018-06-19 (×4): via INTRAVENOUS
  Filled 2018-06-18 (×4): qty 1000

## 2018-06-18 MED ORDER — ONDANSETRON HCL 4 MG/2ML IJ SOLN
INTRAMUSCULAR | Status: DC | PRN
  Administered 2018-06-18: 4 mg via INTRAVENOUS

## 2018-06-18 MED ORDER — HYDROMORPHONE HCL 1 MG/ML IJ SOLN
1.0000 mg | INTRAMUSCULAR | Status: DC | PRN
  Administered 2018-06-18 (×3): 1 mg via INTRAVENOUS
  Filled 2018-06-18 (×3): qty 1

## 2018-06-18 MED ORDER — ALBUMIN HUMAN 5 % IV SOLN
INTRAVENOUS | Status: DC | PRN
  Administered 2018-06-18: 11:00:00 via INTRAVENOUS

## 2018-06-18 MED ORDER — ACETAMINOPHEN 650 MG RE SUPP
650.0000 mg | Freq: Four times a day (QID) | RECTAL | Status: DC | PRN
  Filled 2018-06-18 (×2): qty 1

## 2018-06-18 MED ORDER — CHLORHEXIDINE GLUCONATE CLOTH 2 % EX PADS: 6.0000 | MEDICATED_PAD | Freq: Once | CUTANEOUS | Status: DC

## 2018-06-18 MED ORDER — ALBUTEROL SULFATE (2.5 MG/3ML) 0.083% IN NEBU
2.5000 mg | INHALATION_SOLUTION | RESPIRATORY_TRACT | Status: DC | PRN
  Administered 2018-06-18 – 2018-07-06 (×6): 2.5 mg via RESPIRATORY_TRACT
  Filled 2018-06-18 (×6): qty 3

## 2018-06-18 MED ORDER — 0.9 % SODIUM CHLORIDE (POUR BTL) OPTIME
TOPICAL | Status: DC | PRN
  Administered 2018-06-18: 1000 mL

## 2018-06-18 MED ORDER — LACTATED RINGERS IV SOLN
INTRAVENOUS | Status: DC
  Administered 2018-06-18 (×2): via INTRAVENOUS

## 2018-06-18 MED ORDER — FENTANYL CITRATE (PF) 250 MCG/5ML IJ SOLN
INTRAMUSCULAR | Status: AC
  Filled 2018-06-18: qty 5

## 2018-06-18 MED ORDER — SODIUM CHLORIDE 0.9 % IV SOLN
INTRAVENOUS | Status: DC | PRN
  Administered 2018-06-18: 50 ug/min via INTRAVENOUS

## 2018-06-18 MED ORDER — DM-GUAIFENESIN ER 30-600 MG PO TB12
1.0000 | ORAL_TABLET | Freq: Two times a day (BID) | ORAL | Status: DC
  Administered 2018-06-19 – 2018-06-20 (×2): 1 via ORAL
  Filled 2018-06-18 (×4): qty 1

## 2018-06-18 MED ORDER — BUPIVACAINE HCL (PF) 0.25 % IJ SOLN
INTRAMUSCULAR | Status: AC
  Filled 2018-06-18: qty 30

## 2018-06-18 MED ORDER — FAMOTIDINE IN NACL 20-0.9 MG/50ML-% IV SOLN
20.0000 mg | INTRAVENOUS | Status: DC
  Administered 2018-06-18 – 2018-06-20 (×3): 20 mg via INTRAVENOUS
  Filled 2018-06-18 (×3): qty 50

## 2018-06-18 MED ORDER — PROPOFOL 10 MG/ML IV BOLUS
INTRAVENOUS | Status: DC | PRN
  Administered 2018-06-18: 70 mg via INTRAVENOUS

## 2018-06-18 MED ORDER — BUPIVACAINE-EPINEPHRINE (PF) 0.25% -1:200000 IJ SOLN
INTRAMUSCULAR | Status: AC
  Filled 2018-06-18: qty 30

## 2018-06-18 MED ORDER — DEXAMETHASONE SODIUM PHOSPHATE 10 MG/ML IJ SOLN
INTRAMUSCULAR | Status: DC | PRN
  Administered 2018-06-18: 10 mg via INTRAVENOUS

## 2018-06-18 MED ORDER — FENTANYL CITRATE (PF) 100 MCG/2ML IJ SOLN
INTRAMUSCULAR | Status: AC
  Filled 2018-06-18: qty 2

## 2018-06-18 SURGICAL SUPPLY — 34 items
BLADE SURG 15 STRL LF DISP TIS (BLADE) ×2 IMPLANT
BLADE SURG 15 STRL SS (BLADE) ×2
CHLORAPREP W/TINT 26ML (MISCELLANEOUS) ×8 IMPLANT
CLOSURE WOUND 1/2 X4 (GAUZE/BANDAGES/DRESSINGS) ×1
COVER SURGICAL LIGHT HANDLE (MISCELLANEOUS) ×4 IMPLANT
COVER WAND RF STERILE (DRAPES) ×4 IMPLANT
DECANTER SPIKE VIAL GLASS SM (MISCELLANEOUS) ×4 IMPLANT
DERMABOND ADVANCED (GAUZE/BANDAGES/DRESSINGS) ×2
DERMABOND ADVANCED .7 DNX12 (GAUZE/BANDAGES/DRESSINGS) IMPLANT
DRAIN PENROSE 18X1/2 LTX STRL (DRAIN) ×4 IMPLANT
DRAPE LAPAROTOMY TRNSV 102X78 (DRAPE) ×4 IMPLANT
ELECT PENCIL ROCKER SW 15FT (MISCELLANEOUS) ×4 IMPLANT
ELECT REM PT RETURN 15FT ADLT (MISCELLANEOUS) ×4 IMPLANT
GLOVE BIOGEL PI IND STRL 7.0 (GLOVE) ×2 IMPLANT
GLOVE BIOGEL PI INDICATOR 7.0 (GLOVE) ×2
GLOVE SURG ORTHO 8.0 STRL STRW (GLOVE) ×4 IMPLANT
GOWN STRL REUS W/TWL XL LVL3 (GOWN DISPOSABLE) ×8 IMPLANT
KIT BASIN OR (CUSTOM PROCEDURE TRAY) ×4 IMPLANT
MESH ULTRAPRO 3X6 7.6X15CM (Mesh General) ×2 IMPLANT
NDL HYPO 25X1 1.5 SAFETY (NEEDLE) ×2 IMPLANT
NEEDLE HYPO 25X1 1.5 SAFETY (NEEDLE) ×3 IMPLANT
NS IRRIG 1000ML POUR BTL (IV SOLUTION) ×4 IMPLANT
PACK BASIC VI WITH GOWN DISP (CUSTOM PROCEDURE TRAY) ×4 IMPLANT
SPONGE LAP 4X18 RFD (DISPOSABLE) ×12 IMPLANT
STRIP CLOSURE SKIN 1/2X4 (GAUZE/BANDAGES/DRESSINGS) ×3 IMPLANT
SUT MNCRL AB 4-0 PS2 18 (SUTURE) ×4 IMPLANT
SUT NOVA NAB GS-21 0 18 T12 DT (SUTURE) IMPLANT
SUT NOVA NAB GS-22 2 0 T19 (SUTURE) ×8 IMPLANT
SUT SILK 2 0 SH (SUTURE) ×4 IMPLANT
SUT VIC AB 3-0 SH 18 (SUTURE) ×4 IMPLANT
SYR BULB IRRIGATION 50ML (SYRINGE) ×4 IMPLANT
SYR CONTROL 10ML LL (SYRINGE) ×4 IMPLANT
TOWEL OR 17X26 10 PK STRL BLUE (TOWEL DISPOSABLE) ×4 IMPLANT
YANKAUER SUCT BULB TIP 10FT TU (MISCELLANEOUS) ×4 IMPLANT

## 2018-06-18 NOTE — Progress Notes (Signed)
PACU note-----pt transferred to room 1525 on 3 L/ [place]; on arrival to room O2 saturation 85% on 3 Liters oxygen; unable to get a good waveform on either hand; BP 150/99, pulse 100; pt is alert, talkative, oriented to person and place; [place], RN requests pt be evaluated by rapid response, Dr. Harlow Asa, Dr. Nyoka Cowden, anesthesiologist, and hospitalist notified

## 2018-06-18 NOTE — Progress Notes (Addendum)
Patient returned from PACU with O2 saturation 79-89%. Unable to get a good waveform. Probe placed on left ear with good waveform. Sats remain in high 80s to low 90s.  Respiratory called and is giving nebulizer treatment. O2 sats in the high 80s to low 90s after nebulizer treatment. Patient in no acute distress. Rapid response called for assistance. Dr Nyoka Cowden from anesthesia here. Dr Erlinda Hong called and is here. Patient to be transferred to another unit for closer monitoring. Donne Hazel, RN

## 2018-06-18 NOTE — Progress Notes (Signed)
Patient transferred to stepdown. Donne Hazel, RN

## 2018-06-18 NOTE — Anesthesia Preprocedure Evaluation (Addendum)
Anesthesia Evaluation  Patient identified by MRN, date of birth, ID band Patient awake    Reviewed: Allergy & Precautions, NPO status , Patient's Chart, lab work & pertinent test results  Airway Mallampati: II  TM Distance: >3 FB     Dental   Pulmonary COPD, Current Smoker,    breath sounds clear to auscultation       Cardiovascular hypertension, + Peripheral Vascular Disease   Rhythm:Regular Rate:Normal     Neuro/Psych    GI/Hepatic negative GI ROS, Neg liver ROS,   Endo/Other  negative endocrine ROS  Renal/GU negative Renal ROS     Musculoskeletal   Abdominal   Peds  Hematology   Anesthesia Other Findings   Reproductive/Obstetrics                            Anesthesia Physical Anesthesia Plan  ASA: III  Anesthesia Plan: General   Post-op Pain Management:    Induction: Intravenous  PONV Risk Score and Plan: Ondansetron, Dexamethasone and Midazolam  Airway Management Planned: Oral ETT  Additional Equipment:   Intra-op Plan:   Post-operative Plan: Extubation in OR  Informed Consent: I have reviewed the patients History and Physical, chart, labs and discussed the procedure including the risks, benefits and alternatives for the proposed anesthesia with the patient or authorized representative who has indicated his/her understanding and acceptance.   Dental advisory given  Plan Discussed with: CRNA and Anesthesiologist  Anesthesia Plan Comments:         Anesthesia Quick Evaluation

## 2018-06-18 NOTE — Consult Note (Addendum)
NAME:  [name], MRN:  [id number], DOB:  16-Mar-1951, LOS: 4 ADMISSION DATE:  06/13/2018, CONSULTATION DATE:  06/18/18 REFERRING MD:  Ernst Spell CHIEF COMPLAINT:  Hypoxia, agitation   Brief History   68 yo M who is POD 0 from inguinal hernia repair, after presenting 1/5 with abdominal pain, bilateral inguinal hernias with small bowel obstruction. Post operatively patient is agitated and ha SpO2 high 80s to low 90s; PCCM asked to consult for hypoxia. History of present illness   68 yo M with PMH COPD, HTN, PVD s/p DES, EtOH abuse, NSCLC s/p radiation, recurrent inguinal hernias who presented to [facility] ED with abdominal pain, nausea, vomiting. Pain was characterized as stabbing in quality with increasing severity. Symptoms began 1 day prior to ED presentation, with associated poor appetite. Denied chills, fever, SOB, chest pain, dizziness, HA.   Surgery deferred due to home plavix use; NGT placed for small bowel decompression and patient medically managed until operative intervention 06/18/18. Patient underwent L inguinal hernia repair with mesh insertion. Post operatively patient reportedly exhibiting increased agitation and concomitant hypoxia, with SpO2 noted to be high 80s to low 90s. PCCM consulted for hypoxia.   Past Medical History  COPD HTN PVD NSCLC EtOH abuse Recurrent inguinal hernia Significant Hospital Events   1/5> Admission 1/10> L inguinal hernia repair. Transfer to SDU for hypoxia post-operative  Consults:  Surgery PCCM   Procedures:  1/10> L inguinal hernia repair  Significant Diagnostic Tests:  1/5 CT a/p> bilatera hiatal hernia, distal small bowel obstruction. Transition point located within L inguinal hernia. R hiatal hernia containing ascites  1/6 KUB> enteric tube placement 1/10 CXR>> RLL opacity   Micro Data:  1/10 MRSA> neg  Antimicrobials:    Interim history/subjective:  POD 0 hernia repair. Post-op patient reportedly agitated with SpO2 87. PCCM  consulted for hypoxia.   Objective   Blood pressure (!) 154/100, pulse (!) 110, temperature 97.6 F (36.4 C), temperature source Oral, resp. rate (!) 24, height 5\' 2"  (1.575 m), weight 38.1 kg, SpO2 (!) 89 %.        Intake/Output Summary (Last 24 hours) at 06/18/2018 1417 Last data filed at 06/18/2018 1234 Gross per 24 hour  Intake 2610.78 ml  Output 2585 ml  Net 25.78 ml   Filed Weights   06/13/18 1849 06/18/18 0924  Weight: 39.9 kg 38.1 kg    Examination: General: thin appearing older adult male in NAD HENT: [place]/AT, Nasal cannula secured and NGT secured. 47mm pupils. MMM Lungs: Weak, non-productive cough. Symmetrical chest wall expansion. Diminished bilaterally, bronchial breath sounds R base Cardiovascular: RRR no r/g/m, 2+ radial pulses, 1+ pedal pulses  Abdomen: thin, flat, non-distended, focal tenderness around incision site Extremities: symmetrical bulk and tone with appreciable muscle wasting.  Neuro: Awake, mild confusion, oriented to person place situation Skin: pale, cool, dry, clean intact.   Resolved Hospital Problem list   SBO   Assessment & Plan:   COPD -new RLL opacity appreciated on CXR 1/10 -R pleural effusion appreciated bedside US 1/10 P -titrate  for goal SpO2 88-92% -Will discuss tx options for pleural effusion. Given anatomical view via [place], appreciate risk associated with thoracentesis. Diuresis may be option as renal function acceptable however patient has been NPO for surgery and diuresis may not be fruitful.  -Favor  Close monitoring of clinical status, with conservative interventions as patient normalizes post-operatively and re-evaluating need for either diuresis or thoracentesis 1/11 -turn cough deep breath -IS  -bed percussion -OOB as tolerated -  Pulmicort BID -PRN albuterol -Mucinex   Delirium  -Hospital day 5, POD 0, advanced age, CNS depressing medications, multiple comorbidities  P -strict sleep hygiene, frequent re-orientation -  minimize CNS depressing medications  HTN P -per primary: labetaol 10mg  q8 and PRN hydral      Rest-per primary    Best practice:  Diet: NPO, sips with meds and ice chips  Pain/Anxiety/Delirium protocol (if indicated): tylenol, dilaudid VAP protocol (if indicated): n/a DVT prophylaxis: lovenox GI prophylaxis: pepcid Glucose control: n/a Mobility: OOB Code Status: full Family Communication: girlfriend at bedside Disposition: SDU  Labs   CBC: Recent Labs  Lab 06/15/18 0513 06/16/18 0420 06/17/18 0827 06/18/18 0441 06/18/18 1226  WBC 1.8* 1.7* 2.6* 4.4 5.5  NEUTROABS  --   --   --  3.2  --   HGB 11.7* 11.3* 11.2* 11.5* 10.6*  HCT 35.2* 33.9* 33.6* 34.3* 32.6*  MCV 97.2 98.3 98.2 94.8 97.0  PLT 194 174 205 252 374    Basic Metabolic Panel: Recent Labs  Lab 06/13/18 1924 06/15/18 0513 06/16/18 0420 06/17/18 0827 06/18/18 0441 06/18/18 1226  NA 133* 132* 128* 126* 132*  --   K 4.0 3.6 4.5 4.6 5.1  --   CL 89* 93* 91* 92* 94*  --   CO2 31 29 26 27 27   --   GLUCOSE 167* 150* 125* 160* 95  --   BUN 15 15 27* 27* 31*  --   CREATININE 0.82 0.75 1.27* 1.07 1.08 1.12  CALCIUM 10.2 8.8* 8.8* 8.7* 8.9  --   MG  --   --   --   --  1.8  --    GFR: Estimated Creatinine Clearance: 35 mL/min (by C-G formula based on SCr of 1.12 mg/dL). Recent Labs  Lab 06/16/18 0420 06/17/18 0827 06/18/18 0441 06/18/18 1226  WBC 1.7* 2.6* 4.4 5.5    Liver Function Tests: Recent Labs  Lab 06/13/18 1924  AST 36  ALT 18  ALKPHOS 98  BILITOT 0.4  PROT 8.1  ALBUMIN 4.0   Recent Labs  Lab 06/13/18 1924  LIPASE 53*   No results for input(s): AMMONIA in the last 168 hours.  ABG No results found for: PHART, PCO2ART, PO2ART, HCO3, TCO2, ACIDBASEDEF, O2SAT   Coagulation Profile: No results for input(s): INR, PROTIME in the last 168 hours.  Cardiac Enzymes: No results for input(s): CKTOTAL, CKMB, CKMBINDEX, TROPONINI in the last 168 hours.  HbA1C: No results found  for: HGBA1C  CBG: No results for input(s): GLUCAP in the last 168 hours.  Review of Systems:   Endorses abdominal pain, localized to incision site Denies chest pain Denies SOB Endorses cough   Past Medical History  He,  has a past medical history of COPD (chronic obstructive pulmonary disease) ([place]), Hypertension, Iliac artery stenosis, right ([place]), 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[place]), Lower limb ischemia (03/26/2018), Lung cancer (dx'd 12/2014), Pancreatitis, Pulmonary hypertension ([place]), Severe claudication ([place]), Status post left foot surgery, and Tobacco abuse.   Surgical History    Past Surgical History:  Procedure Laterality Date  . ABDOMINAL AORTOGRAM W/LOWER EXTREMITY N/A 03/30/2018   Procedure: ABDOMINAL AORTOGRAM W/LOWER EXTREMITY;  Surgeon: Nigel Mormon, MD;  Location: [facility];  Service: Cardiovascular;  Laterality: N/A;  . COLECTOMY    . COLOSTOMY    . COLOSTOMY REVERSAL    . EUS N/A 12/14/2014   Procedure: UPPER ENDOSCOPIC ULTRASOUND (EUS) LINEAR;  Surgeon: Milus Banister, MD;  Location: [facility];  Service: Endoscopy;  Laterality: N/A;  . FOOT SURGERY Left 2006  . PANCREAS SURGERY  2007  . PERIPHERAL VASCULAR INTERVENTION  03/30/2018   Procedure: PERIPHERAL VASCULAR INTERVENTION;  Surgeon: Nigel Mormon, MD;  Location: [facility];  Service: Cardiovascular;;  Rt Iliac  . UPPER GI ENDOSCOPY    . [place]     Social History   reports that he has been smoking cigarettes. He has a 25.00 pack-year smoking history. He has never used smokeless tobacco. He reports current alcohol use. He reports that he does not use drugs.   Family History   His family history includes CAD in his mother; COPD in his mother; Cancer in his brother and paternal grandmother; Diabetes in his father.   Allergies Allergies  Allergen Reactions  . Chantix [Varenicline] Other (See Comments)    Physically ill  . Prevacid [Lansoprazole] Itching     "swell up with gas"     Home Medications  Prior to Admission medications   Medication Sig Start Date End Date Taking? Authorizing Provider  amLODipine (NORVASC) 10 MG tablet Take 10 mg by mouth every morning.  11/07/14  Yes [provider]  buPROPion (WELLBUTRIN XL) 150 MG 24 hr tablet Take 150 mg by mouth every morning.  10/30/14  Yes [provider]  clopidogrel (PLAVIX) 75 MG tablet Take 1 tablet (75 mg total) by mouth daily. 03/30/18 03/30/19 Yes Patwardhan, Manish J, MD  dextromethorphan-guaiFENesin (MUCINEX DM) 30-600 MG 12hr tablet Take 1-2 tablets by mouth 2 (two) times daily as needed for cough.   Yes [provider]  ipratropium-albuterol (DUONEB) 0.5-2.5 (3) MG/3ML SOLN Take 3 mLs by nebulization every 6 (six) hours as needed (shortness of breath and or wheezing).   Yes [provider]  metoprolol succinate (TOPROL-XL) 50 MG 24 hr tablet Take 1 tablet (50 mg total) by mouth every morning. 03/30/18  Yes Patwardhan, Reynold Bowen, MD  Multiple Vitamin (MULTIVITAMIN WITH MINERALS) TABS tablet Take 1 tablet by mouth every morning.   Yes [provider]  Nutritional Supplements (CARNATION BREAKFAST ESSENTIALS PO) Take 1 Container by mouth 2 (two) times daily.    Yes [provider]  PROAIR HFA 108 (90 BASE) MCG/ACT inhaler Inhale 1 puff into the lungs every 4 (four) hours as needed for wheezing or shortness of breath.  10/16/14  Yes [provider]  triamcinolone (KENALOG) 0.025 % cream Apply 1 application topically 2 (two) times daily. Apply to face 03/15/18  Yes [provider]  triamcinolone ointment (KENALOG) 0.1 % Apply 1 application topically 2 (two) times daily. Apply to legs and ears 03/16/18  Yes [provider]     Eliseo Gum MSN, AGACNP-BC [place] 06/18/2018, 3:34 PM

## 2018-06-18 NOTE — Progress Notes (Signed)
Call made to [facility] to give report. Questions answered. Donne Hazel, RN

## 2018-06-18 NOTE — Anesthesia Procedure Notes (Signed)
Procedure Name: Intubation Date/Time: 06/18/2018 10:05 AM Performed by: Mitzie Na, CRNA Pre-anesthesia Checklist: Patient identified, Emergency Drugs available, Suction available, Patient being monitored and Timeout performed Patient Re-evaluated:Patient Re-evaluated prior to induction Oxygen Delivery Method: Circle system utilized Preoxygenation: Pre-oxygenation with 100% oxygen Induction Type: IV induction and Rapid sequence Laryngoscope Size: Mac and 3 Grade View: Grade I Tube type: Oral Tube size: 7.0 mm Number of attempts: 1 Airway Equipment and Method: Stylet Placement Confirmation: ETT inserted through vocal cords under direct vision and positive ETCO2 Secured at: 23 cm Tube secured with: Tape Dental Injury: Teeth and Oropharynx as per pre-operative assessment

## 2018-06-18 NOTE — Anesthesia Postprocedure Evaluation (Signed)
Anesthesia Post Note  Patient: [name]  Procedure(s) Performed: OPEN RECURRENT LEFT INGUINAL HERNIA REPAIR WITH MESH (Left Inguinal) INSERTION OF MESH (Left Inguinal)     Patient location during evaluation: PACU Anesthesia Type: General Level of consciousness: awake Pain management: pain level controlled Respiratory status: spontaneous breathing Cardiovascular status: stable Postop Assessment: no apparent nausea or vomiting Anesthetic complications: no    Last Vitals:  Vitals:   06/18/18 1215 06/18/18 1230  BP: (!) 169/93   Pulse: 93   Resp: 20   Temp:    SpO2: 96% 94%    Last Pain:  Vitals:   06/18/18 1215  TempSrc:   PainSc: 5                  Willena Jeancharles

## 2018-06-18 NOTE — Anesthesia Postprocedure Evaluation (Signed)
Anesthesia Post Note  Patient: [name]  Procedure(s) Performed: OPEN RECURRENT LEFT INGUINAL HERNIA REPAIR WITH MESH (Left Inguinal) INSERTION OF MESH (Left Inguinal)     Patient location during evaluation: PACU Anesthesia Type: General Level of consciousness: awake Pain management: pain level controlled Vital Signs Assessment: post-procedure vital signs reviewed and stable Respiratory status: spontaneous breathing Cardiovascular status: stable Postop Assessment: no apparent nausea or vomiting Anesthetic complications: no    Last Vitals:  Vitals:   06/18/18 1142 06/18/18 1145  BP: (!) 161/90 (!) 163/88  Pulse: 91   Resp: 16 15  Temp: 36.6 C   SpO2: 95%     Last Pain:  Vitals:   06/18/18 1142  TempSrc:   PainSc: Asleep                 Jaime Grizzell

## 2018-06-18 NOTE — Progress Notes (Signed)
Hospitalist consult PROGRESS NOTE  Lauren Aguayo EPP:[id number] DOB: [birth year] DOA: 06/13/2018 PCP: [name], MD  HPI/Recap of past 24 hours:   Called by rapid response to see the patient in the room after patient returned from surgery due to post op hypoxia and confusion  He does has some congested cough ,currently he is on 4 L O2 94%, he reports feeling sob,  he does not appear in respiratory distress, he is more confused compared to yesterday, possibly partially from anesthesia.  Girlfriend of 5 years at bedside with his permission  Assessment/Plan: Principal Problem:   Inguinal hernia of left side with obstruction and without gangrene Active Problems:   Smoker   COPD (chronic obstructive pulmonary disease) ([place])   Essential hypertension   Protein-calorie malnutrition, severe (HCC)   Severe claudication (HCC)   PAD (peripheral artery disease) (HCC)  Postop hypoxia and acute metabolic encephalopathy -Patient has poor pulmonary reserve due to advanced COPD, continued smoker, now status post abdomen surgery, with increased confusion, at risk of decompensation, will get stat chest x-ray, transfer stepdown unit, critical care consulted   Acute metabolic encephalopathy: Likely multifactorial , alcohol withdrawal? Hyponatremia, surgery with anesthesia -to stepdown unit  SBO 2/2 left inguinal hernia -s/p ng placement, npo/ivf -On general surgery service, Plavix held for 5 days, he is brought to the OR  On 1/10 and underwent OPEN RECURRENT LEFT INGUINAL HERNIA REPAIR WITH MESH (Left), INSERTION OF MESH (Left)   H/o internal hemorrhoids -Seen by Dr. Thana Farr in April 2019, s/p rubber band ligation  -Blood per rectum this morning, denies abdominal pain -Defer to general surgery  Hyponatremia: D/c d51/2saline, change to normal saline, sodium improved on repeat  bmp    COPD/heavy smoker -Not on home O2 -Likely will benefit O2 at discharge ( per RN prior to surgery patient o2  dropped to the 70's on room air  while Ambulating with one person  Assist to the bathroom)  Left upper lobe lung cancer  Lung nodules x2 left upper lobe and 1 left lower lobe s/p XRT -Case discussed with radiation oncology Dr. Tammi Klippel who stated patient has stage Ia status post curative intent treatment XRT finished in July 2019 -Per Dr. Tammi Klippel patient missed his restaging scan, Dr. Eber Jones recommended CT chest with IV contrast prior to discharge, and follow with him every 78-month.  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    Code Status: full  Family Communication: patient and girlfriend at bedside  Disposition Plan: Transfer to stepdown unit   Consultants:  General surgery as primary  hospitalist as consultant  Cardiology as consultant  Procedures:  Ng placement  Antibiotics:  none   Objective: BP (!) 188/93 (BP Location: Right Arm)   Pulse (!) 108   Temp 97.6 F (36.4 C) (Oral)   Resp 16   Ht 5\' 2"  (1.575 m)   Wt 40.5 kg   SpO2 93%   BMI 16.33 kg/m   Intake/Output Summary (Last 24 hours) at 06/18/2018 1625 Last data filed at 06/18/2018 1234 Gross per 24 hour  Intake 2610.78 ml  Output 2285  ml  Net 325.78 ml   Filed Weights   06/13/18 1849 06/18/18 0924 06/18/18 1437  Weight: 39.9 kg 38.1 kg 40.5 kg    Exam: Patient is examined daily including today on 06/18/2018, exams remain the same as of yesterday except that has changed    General:  Thin, frail, chronically ill appearing, alert, but confused, on NG  suction  Cardiovascular: RRR  Respiratory: diminished at basis, no wheezing  Abdomen: post op changes, diminished bowel sounds  Musculoskeletal: No Edema  Neuro: alert, + confusion  Data Reviewed: Basic Metabolic Panel: Recent Labs  Lab 06/13/18 1924 06/15/18 0513 06/16/18 0420 06/17/18 0827 06/18/18 0441 06/18/18 1226  NA 133* 132* 128* 126* 132*  --   K 4.0 3.6 4.5 4.6 5.1  --   CL 89* 93* 91* 92* 94*  --   CO2 31 29 26 27 27   --   GLUCOSE 167* 150* 125* 160* 95  --   BUN 15 15 27* 27* 31*  --   CREATININE 0.82 0.75 1.27* 1.07 1.08 1.12  CALCIUM 10.2 8.8* 8.8* 8.7* 8.9  --   MG  --   --   --   --  1.8  --    Liver Function Tests: Recent Labs  Lab 06/13/18 1924  AST 36  ALT 18  ALKPHOS 98  BILITOT 0.4  PROT 8.1  ALBUMIN 4.0   Recent Labs  Lab 06/13/18 1924  LIPASE 53*   No results for input(s): AMMONIA in the last 168 hours. CBC: Recent Labs  Lab 06/15/18 0513 06/16/18 0420 06/17/18 0827 06/18/18 0441 06/18/18 1226  WBC 1.8* 1.7* 2.6* 4.4 5.5  NEUTROABS  --   --   --  3.2  --   HGB 11.7* 11.3* 11.2* 11.5* 10.6*  HCT 35.2* 33.9* 33.6* 34.3* 32.6*  MCV 97.2 98.3 98.2 94.8 97.0  PLT 194 174 205 252 242   Cardiac Enzymes:   No results for input(s): CKTOTAL, CKMB, CKMBINDEX, TROPONINI in the last 168 hours. BNP (last 3 results) No results for input(s): BNP in the last 8760 hours.  ProBNP (last 3 results) No results for input(s): PROBNP in the last 8760 hours.  CBG: No results for input(s): GLUCAP in the last 168 hours.  Recent Results (from the past 240 hour(s))  Surgical PCR screen     Status: None   Collection Time: 06/18/18  1:47 AM  Result Value Ref Range Status   MRSA, PCR NEGATIVE NEGATIVE Final   Staphylococcus aureus NEGATIVE NEGATIVE Final    Comment: (NOTE) The Xpert SA Assay (FDA approved for NASAL specimens in patients 73 years of age and older), is one component of a comprehensive surveillance program. It is not intended to  diagnose infection nor to guide or monitor treatment. Performed at [facility], [place] [address], [place], [place] [address]      Studies: Dg Chest Port 1 View  Result Date: 06/18/2018 CLINICAL DATA:  Shortness of breath. EXAM: PORTABLE CHEST 1 VIEW COMPARISON:  Chest x-ray dated 06/15/2026 FINDINGS: NG tube tip is in the fundus of the stomach. Heart size and vascularity are normal. Small bilateral pleural effusions with probable atelectasis at the left base medially. Slight aortic atherosclerosis. IMPRESSION: Small new bilateral pleural effusions with probable atelectasis at the left base. Electronically Signed   By: Lorriane Shire M.D.   On: 06/18/2018 14:56   Dg Abd Portable 1v  Result Date: 06/17/2018 CLINICAL DATA:  Nasogastric tube placement. EXAM: PORTABLE ABDOMEN -  1 VIEW COMPARISON:  Radiograph of June 15, 2018. FINDINGS: Distal tip of nasogastric tube is seen in proximal stomach. Dilated small bowel loops are again noted concerning for distal small bowel obstruction. Stool is noted in right colon. No colonic dilatation is noted. Pancreatic calcifications are noted suggesting chronic pancreatitis. IMPRESSION: Distal tip of nasogastric tube seen in proximal stomach. Dilated small bowel loops are again noted concerning for distal small bowel obstruction. Electronically Signed   By: Marijo Conception, M.D.   On: 06/17/2018 16:34    Scheduled Meds: . budesonide (PULMICORT) nebulizer solution  0.5 mg Nebulization BID  . dextromethorphan-guaiFENesin  1 tablet Oral BID  . [START ON 06/19/2018] enoxaparin (LOVENOX) injection  30 mg Subcutaneous Q24H  . fentaNYL      . folic acid  1 mg Oral Daily  . furosemide  20 mg Intravenous Once  . hydrocortisone-pramoxine   Rectal QID  . ipratropium-albuterol  3 mL Nebulization TID  . labetalol  10 mg Intravenous Q8H  . mouth rinse  15 mL Mouth Rinse BID  . multivitamin with minerals  1 tablet Oral Daily  . mupirocin ointment  1  application Nasal BID  . nicotine  14 mg Transdermal Daily  . thiamine  100 mg Oral Daily   Or  . thiamine  100 mg Intravenous Daily    Continuous Infusions: . sodium chloride 250 mL (06/16/18 1114)  . dextrose 5 % and 0.45 % NaCl with KCl 20 mEq/L 100 mL/hr at 06/18/18 1606  . famotidine (PEPCID) IV    . methocarbamol (ROBAXIN) IV 500 mg (06/16/18 2304)   Total Critical Care time in examining the patient bedside, evaluating Lab work and other data, over half of the total time was spent in coordinating patient care on the floor or bedisde in talking to patient/family members, communicating with nursing Staff on the floor and sub specialists anesthesiology, general surgery, critical care to coordinate patients medical care and needs is 43 Minutes.  The condition which has caused critical injury/acute impairment of  vital organ system with a high probability of sudden clinically significant deterioration and can cause Potential Life threatening injury to this patient addressed today is postop hypoxia, confusion/encephalopathy   Florencia Reasons MD, PhD  [facility] Pager [phone]. If 7PM-7AM, please contact night-coverage at [email or web address], password [email or web address] 06/18/2018, 4:25 PM  LOS: 4 days

## 2018-06-18 NOTE — Transfer of Care (Signed)
Immediate Anesthesia Transfer of Care Note  Patient: [name]  Procedure(s) Performed: OPEN RECURRENT LEFT INGUINAL HERNIA REPAIR WITH MESH (Left Inguinal) INSERTION OF MESH (Left Inguinal)  Patient Location: PACU  Anesthesia Type:General  Level of Consciousness: awake, alert , oriented and patient cooperative  Airway & Oxygen Therapy: Patient Spontanous Breathing and Patient connected to face mask oxygen  Post-op Assessment: Report given to RN, Post -op Vital signs reviewed and stable and Patient moving all extremities  Post vital signs: Reviewed and stable  Last Vitals:  Vitals Value Taken Time  BP 146/93 06/18/2018 12:30 PM  Temp    Pulse 92 06/18/2018 12:33 PM  Resp 14 06/18/2018 12:34 PM  SpO2 81 % 06/18/2018 12:33 PM  Vitals shown include unvalidated device data.  Last Pain:  Vitals:   06/18/18 1215  TempSrc:   PainSc: 5       Patients Stated Pain Goal: 4 (00/34/91 7915)  Complications: No apparent anesthesia complications

## 2018-06-18 NOTE — Op Note (Signed)
Inguinal Hernia, Open, Procedure Note  Pre-operative Diagnosis: Incarcerated recurrent left inguinal hernia, small bowel obstruction  Post-operative Diagnosis: same  Surgeon:  Earnstine Regal, MD, FACS  Assistant:  Alferd Apa, [facility]  Anesthesia:  General  Preparation:  Chlora-prep  Estimated Blood Loss: minimal  Complications:  none  Indications: The patient presented with a left, not reducible hernia with small bowel obstruction.  Procedure Details  The patient was evaluated in the holding area. All of the patient's questions were answered and the proposed procedure was confirmed. The site of the procedure was properly marked. The patient was taken to the Operating Room, identified by name, and the procedure verified as inguinal hernia repair.  The patient was placed in the supine position and underwent induction of anesthesia. A "Time Out" was performed per routine. The lower abdomen and groin were prepped and draped in the usual aseptic fashion.  After ascertaining that an adequate level of anesthesia had been obtained, an incision was made in the groin with a #10 blade.  Dissection was carried through the subcutaneous tissues and hemostasis obtained with the electrocautery.  A Gelpi retractor was placed for exposure.  The external oblique fascia was incised in line with it's fibers and extended through the external inguinal ring.  The cord structures were dissected out of the inguinal canal and encircled with a Penrose drain.  There is an indirect inguinal hernia sac present.  This is dissected out.  The sac is opened and contains a small amount of ascitic fluid.  There is a loop of small bowel within the hernia sac.  It is somewhat congested but certainly viable.  The internal ring is opened sharply with the Metzenbaum scissors.  This allows for delivery of further small bowel open into the hernia defect to be examined.  There is no sign of infarction.  Bowel is reduced back within the  peritoneal cavity.  The internal ring is closed laterally with interrupted #1 Novafil simple sutures.  The floor of the inguinal canal was dissected out.  There is laxity in the floor the inguinal canal but no direct inguinal hernia defect.  The cord was explored and structures were preserved.  The floor of the inguinal canal was reconstructed with Ethicon Ultrapro mesh cut to the appropriate dimensions.  It was secured to the pubic tubercle with a 2-0 Novafil suture and along the inguinal ligament with a running 2-0 Novafil suture.  Mesh was split to accommodate the cord structures.  The superior margin of the mesh was secured to the transversalis and internal oblique musculature with interrupted 2-0 Novafil sutures.  The tails of the mesh were overlapped lateral to the cord structures and secured to the inguinal ligament with interrupted 2-0 Novafil sutures to recreate the internal inguinal ring.  Cord structures were returned to the inguinal canal.  Local anesthetic was infiltrated throughout the field.  External oblique fascia was closed with interrupted 3-0 Vicryl sutures.  Subcutaneous tissues were closed with interrupted 3-0 Vicryl sutures.  Skin was anesthetized with local anesthetic, and the skin edges were re-approximated with a running 4-0 Monocryl suture.  Wound was washed and dried and Dermabond was applied.   Instrument, sponge, and needle counts were correct prior to closure and at the conclusion of the case.  The patient tolerated the procedure well.  The patient was awakened from anesthesia and brought to the recovery room in stable condition.  Armandina Gemma, MD [facility] Office: [phone]

## 2018-06-18 NOTE — Interval H&P Note (Signed)
History and Physical Interval Note:  06/18/2018 9:42 AM  [name]  has presented today for surgery, with the diagnosis of recurrent left inguinal hernia with small bowel obstruction . The various methods of treatment have been discussed with the patient and family. After consideration of risks, benefits and other options for treatment, the patient has consented to    Procedure(s): OPEN RECURRENT LEFT INGUINAL HERNIA REPAIR WITH MESH (Left) INSERTION OF MESH (Left) as a surgical intervention .    The patient's history has been reviewed, patient examined, no change in status, stable for surgery.  I have reviewed the patient's chart and labs.  Questions were answered to the patient's satisfaction.    Armandina Gemma, [facility] Office: [place]

## 2018-06-18 NOTE — Significant Event (Addendum)
Rapid Response Event Note  Overview: Time Called: 1332 Arrival Time: 8756 Event Type: Respiratory   Called by bedside RN due to patient desaturating upon returning from PACU. Patient just had received hernia repair. Upon entering room patient's O2 Saturations were 82% on 4Liters. Ordered RN to increase Nasal Canula from 4L to 6 L. Patient's O2 Saturations increased to 92-93%. Respiratory Therapist, Jinny Blossom, also gave breathing treatment to assist with shortness of breath and congestion.   Initial Focused Assessment: Neuro: Alert, but disoriented x2, oriented to self and situation, PERRLA intact, able to follow commands with ease but weak in all extremities, did have pain at LLQ where hernia repair was completed.  Cardiac: ST, S1 and S2 heart sounds heard upon auscultation, pulses 1+ in radial and pedal locations bilaterally. Pulmonary: O2 Sats currently 93% on 6Liters [place]. No apparent respiratory distress at this time. Breath sounds diminished on left side but clear, rhonchi noted on right side within lung fields.   Interventions: Respiratory paged to bedside to give breathing treatment Increased [place] from 4-6Liters Paged MD Erlinda Hong Transferred to SDU for further observe respiratory status Plan of Care (if not transferred):      [name]

## 2018-06-19 ENCOUNTER — Inpatient Hospital Stay (HOSPITAL_COMMUNITY): Payer: Medicare Other

## 2018-06-19 LAB — BLOOD GAS, ARTERIAL
Acid-Base Excess: 6.2 mmol/L — ABNORMAL HIGH (ref 0.0–2.0)
Bicarbonate: 30.5 mmol/L — ABNORMAL HIGH (ref 20.0–28.0)
DRAWN BY: 257701
O2 Content: 3 L/min
O2 SAT: 94.5 %
PATIENT TEMPERATURE: 98.6
pCO2 arterial: 45.3 mmHg (ref 32.0–48.0)
pH, Arterial: 7.444 (ref 7.350–7.450)
pO2, Arterial: 74.5 mmHg — ABNORMAL LOW (ref 83.0–108.0)

## 2018-06-19 LAB — BASIC METABOLIC PANEL
Anion gap: 9 (ref 5–15)
BUN: 27 mg/dL — ABNORMAL HIGH (ref 8–23)
CO2: 26 mmol/L (ref 22–32)
Calcium: 7.9 mg/dL — ABNORMAL LOW (ref 8.9–10.3)
Chloride: 97 mmol/L — ABNORMAL LOW (ref 98–111)
Creatinine, Ser: 0.88 mg/dL (ref 0.61–1.24)
GFR calc Af Amer: >60 mL/min (ref >60)
GFR calc non Af Amer: >60 mL/min (ref >60)
Glucose, Bld: 201 mg/dL — ABNORMAL HIGH (ref 70–99)
Potassium: 3.9 mmol/L (ref 3.5–5.1)
SODIUM: 132 mmol/L — AB (ref 135–145)

## 2018-06-19 LAB — CBC
HEMATOCRIT: 27.3 % — AB (ref 39.0–52.0)
Hemoglobin: 9.2 g/dL — ABNORMAL LOW (ref 13.0–17.0)
MCH: 32.4 pg (ref 26.0–34.0)
MCHC: 33.7 g/dL (ref 30.0–36.0)
MCV: 96.1 fL (ref 80.0–100.0)
Platelets: 210 10*3/uL (ref 150–400)
RBC: 2.84 MIL/uL — ABNORMAL LOW (ref 4.22–5.81)
RDW: 14.3 % (ref 11.5–15.5)
WBC: 7 10*3/uL (ref 4.0–10.5)
nRBC: 0 % (ref 0.0–0.2)

## 2018-06-19 LAB — LACTIC ACID, PLASMA: LACTIC ACID, VENOUS: 1.5 mmol/L (ref 0.5–1.9)

## 2018-06-19 MED ORDER — FUROSEMIDE 10 MG/ML IJ SOLN
20.0000 mg | Freq: Once | INTRAMUSCULAR | Status: AC
  Administered 2018-06-19: 20 mg via INTRAVENOUS
  Filled 2018-06-19: qty 2

## 2018-06-19 NOTE — Progress Notes (Signed)
Hospitalist consult PROGRESS NOTE  Olegario Emberson DPO:[id number] DOB: March 14, 1951 DOA: 06/13/2018 PCP: [name], MD  HPI/Recap of past 24 hours:  He received dilaudid for pain around midnight, then ativan around 6am for agitation , he has been sedated whole day, then he woke up in the afternoon not recognizing his girlfriend.  Girlfriend who is a Therapist, sports requests blood culture/lactic acid to be done   Ng removed per general surgery order  Assessment/Plan: Principal Problem:   Inguinal hernia of left side with obstruction and without gangrene Active Problems:   Smoker   COPD (chronic obstructive pulmonary disease) ([place])   Essential hypertension   Protein-calorie malnutrition, severe (HCC)   Severe claudication (HCC)   PAD (peripheral artery disease) ([place])   Acute hypoxemic respiratory failure (HCC)   Acute metabolic encephalopathy   SBO (small bowel obstruction) ([place])  Postop hypoxia  -Patient has poor pulmonary reserve due to advanced COPD, continued smoker, now status post abdomen surgery, with increased confusion, at risk of decompensation, he is transferred to stepdown unit on 1/10 -stat chest x-ray obtained on 1/10 showed "Small new bilateral pleural effusions with probable atelectasis at the left base" he received lasix 20mg  iv x1 with good diruesis -critical care consulted, input appreciated   Acute metabolic encephalopathy: Likely multifactorial , alcohol withdrawal? Hyponatremia, surgery with anesthesia, inhospital delirium -per family patient has h/o confusion in the past during hospitalization -confusion get worse, avoid dilaudid ,ativan , will get Ct head/blood culture/lactic acid -remain in stepdown unit  SBO 2/2 left inguinal hernia -s/p ng placement, npo/ivf -On general surgery service, Plavix held for 5 days, he is brought to the OR  On 1/10 and underwent OPEN RECURRENT LEFT INGUINAL HERNIA REPAIR WITH MESH (Left), INSERTION OF MESH (Left)   H/o internal  hemorrhoids -Seen by Dr. Thana Farr in April 2019, s/p rubber band ligation  -Blood per rectum this morning, denies abdominal pain -Defer to general surgery  Hyponatremia: D/c d51/2saline, change to normal saline, sodium improved on repeat  bmp    COPD/heavy smoker -Not on home O2 -Likely will benefit O2 at discharge ( per RN prior to surgery patient o2 dropped to the 70's on room air  while Ambulating with one person  Assist to the bathroom) -need outpatient follow up for COPD  Left upper lobe lung cancer  Lung nodules x2 left upper lobe and 1 left lower lobe s/p XRT -Case discussed with radiation oncology Dr. Tammi Klippel who stated patient has stage Ia status post curative intent treatment XRT finished in July 2019 -Per Dr. Tammi Klippel patient missed his restaging scan, Dr. Eber Jones recommended CT chest with IV contrast prior to discharge, and follow with him every 77-month.  PVD Has an appointment with vascular surgery Dr Donnetta Hutching on 2/11 Girl friend reports patient has an appointment with Dr Donnetta Hutching on 1/6 however patient was admitted to the hospital Girl friend reports patient has increase pain on left leg while walking, she request vascular being consulted while patient is in the hospital, will contact vascular surgery on Monday.  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    Code Status: full  Family Communication: patient and girlfriend at bedside  Disposition Plan: remain in stepdown unit   Consultants:  General surgery as primary  hospitalist as consultant  Cardiology as consultant  Procedures:  Ng  placement  Antibiotics:  none   Objective: BP (!) 146/71   Pulse 83   Temp 97.7 F (36.5 C) (Axillary)   Resp 13   Ht 5\' 2"  (1.575 m)   Wt 40.5 kg   SpO2 100%   BMI 16.33 kg/m   Intake/Output Summary (Last 24 hours) at 06/19/2018 0746 Last data filed at 06/19/2018 0415 Gross per 24 hour  Intake 3212.97 ml  Output 3135 ml  Net 77.97 ml   Filed Weights   06/13/18 1849 06/18/18 0924 06/18/18 1437  Weight: 39.9 kg 38.1 kg 40.5 kg    Exam: Patient is examined daily including today on 06/19/2018, exams remain the same as of yesterday except that has changed    General:  Thin, frail, chronically ill appearing, sedated  Cardiovascular: RRR  Respiratory: diminished at basis, no wheezing  Abdomen: post op changes, diminished bowel sounds  Musculoskeletal: No Edema  Neuro: sedated  Data Reviewed: Basic Metabolic Panel: Recent Labs  Lab 06/15/18 0513 06/16/18 0420 06/17/18 0827 06/18/18 0441 06/18/18 1226 06/19/18 0316  NA 132* 128* 126* 132*  --  132*  K 3.6 4.5 4.6 5.1  --  3.9  CL 93* 91* 92* 94*  --  97*  CO2 29 26 27 27   --  26  GLUCOSE 150* 125* 160* 95  --  201*  BUN 15 27* 27* 31*  --  27*  CREATININE 0.75 1.27* 1.07 1.08 1.12 0.88  CALCIUM 8.8* 8.8* 8.7* 8.9  --  7.9*  MG  --   --   --  1.8  --   --    Liver Function Tests: Recent Labs  Lab 06/13/18 1924  AST 36  ALT 18  ALKPHOS 98  BILITOT 0.4  PROT 8.1  ALBUMIN 4.0   Recent Labs  Lab 06/13/18 1924  LIPASE 53*   No results for input(s): AMMONIA in the last 168 hours. CBC: Recent Labs  Lab 06/16/18 0420 06/17/18 0827 06/18/18 0441 06/18/18 1226 06/19/18 0316  WBC 1.7* 2.6* 4.4 5.5 7.0  NEUTROABS  --   --  3.2  --   --   HGB 11.3* 11.2* 11.5* 10.6* 9.2*  HCT 33.9* 33.6* 34.3* 32.6* 27.3*  MCV 98.3 98.2 94.8 97.0 96.1  PLT 174 205 252 242 210   Cardiac Enzymes:   No results for input(s): CKTOTAL, CKMB, CKMBINDEX, TROPONINI in the last 168 hours. BNP (last 3 results) No  results for input(s): BNP in the last 8760 hours.  ProBNP (last 3 results) No results for input(s): PROBNP in the last 8760 hours.  CBG: Recent Labs  Lab 06/18/18 1712  GLUCAP 115*    Recent Results (from the past 240 hour(s))  Surgical PCR screen     Status: None   Collection Time: 06/18/18  1:47 AM  Result Value Ref Range Status   MRSA, PCR NEGATIVE NEGATIVE Final   Staphylococcus aureus NEGATIVE NEGATIVE Final    Comment: (NOTE) The Xpert SA Assay (FDA approved for NASAL specimens in patients 18 years of age and older), is one component of a comprehensive surveillance program. It is not intended to diagnose infection nor to guide or monitor treatment. Performed at [facility], [place] [address] [place], [place] [address]      Studies: Dg Chest Port 1 View  Result Date: 06/18/2018  CLINICAL DATA:  Shortness of breath. EXAM: PORTABLE CHEST 1 VIEW COMPARISON:  Chest x-ray dated 06/15/2026 FINDINGS: NG tube tip is in the fundus of the stomach. Heart size and vascularity are normal. Small bilateral pleural effusions with probable atelectasis at the left base medially. Slight aortic atherosclerosis. IMPRESSION: Small new bilateral pleural effusions with probable atelectasis at the left base. Electronically Signed   By: Lorriane Shire M.D.   On: 06/18/2018 14:56    Scheduled Meds: . budesonide (PULMICORT) nebulizer solution  0.5 mg Nebulization BID  . dextromethorphan-guaiFENesin  1 tablet Oral BID  . enoxaparin (LOVENOX) injection  30 mg Subcutaneous Q24H  . folic acid  1 mg Oral Daily  . hydrocortisone-pramoxine   Rectal QID  . ipratropium-albuterol  3 mL Nebulization TID  . labetalol  10 mg Intravenous Q8H  . mouth rinse  15 mL Mouth Rinse BID  . multivitamin with minerals  1 tablet Oral Daily  . mupirocin ointment  1 application Nasal BID  . nicotine  14 mg Transdermal Daily  . thiamine  100 mg Oral Daily   Or  . thiamine  100 mg Intravenous Daily     Continuous Infusions: . sodium chloride 250 mL (06/16/18 1114)  . dextrose 5 % and 0.45 % NaCl with KCl 20 mEq/L 100 mL/hr at 06/19/18 0532  . famotidine (PEPCID) IV Stopped (06/18/18 1826)  . methocarbamol (ROBAXIN) IV 500 mg (06/16/18 2304)    Time spent: 21mins I have personally reviewed and interpreted on  06/19/2018 daily labs,  imagings as discussed above under date review session and assessment and plans.  I reviewed all nursing notes, pharmacy notes, consultant notes,  vitals, pertinent old records  I have discussed plan of care as described above with RN , patient  and family on 06/19/2018  Florencia Reasons MD, PhD  [facility] Pager [phone]. If 7PM-7AM, please contact night-coverage at [email or web address], password [facility] 06/19/2018, 7:46 AM  LOS: 5 days

## 2018-06-19 NOTE — Progress Notes (Addendum)
[place] [facility] Office:  [phone] General Surgery Progress Note   LOS: 5 days  POD -  1 Day Post-Op  Chief Complaint: Abdominal pain  Assessment and Plan: 1.  OPEN RECURRENT LEFT INGUINAL HERNIA REPAIR WITH MESH - 06/18/2018 - [place]  Still has NGT - will try this out  Keep on ice chips only  2.  Has right inguinal hernia 3.  HTN 4.  COPD/smokes 5.  History of lung ca - s/p radiation 6.  PVD s/p stenting of right leg - Early 7.  DVT prophylaxis - Lovenox 8.  History of being on Plavix 9.  Confused/encephalopathy - sedated on my exam  Question role of EtOH withdrawal vs hospital course 10.  Foley - to take out 11.  Has NS hanging - change IVF to add K+  K+ - 3.9 - 06/19/2018   Principal Problem:   Inguinal hernia of left side with obstruction and without gangrene Active Problems:   Smoker   COPD (chronic obstructive pulmonary disease) (HCC)   Essential hypertension   Protein-calorie malnutrition, severe (HCC)   Severe claudication (HCC)   PAD (peripheral artery disease) (HCC)   Acute hypoxemic respiratory failure (HCC)   Acute metabolic encephalopathy   SBO (small bowel obstruction) (HCC)   Subjective:  Sedated, cannot interview.       Girlfriend, Resa Miner, at bedside.  Objective:   Vitals:   06/19/18 0616 06/19/18 0621  BP:    Pulse:    Resp:    Temp:    SpO2: 100% 100%     Intake/Output from previous day:  01/10 0701 - 01/11 0700 In: 3213 [I.V.:2913; IV Piggyback:300] Out: 3135 [Urine:2650; Emesis/NG output:475; Blood:10]  Intake/Output this shift:  No intake/output data recorded.   Physical Exam:   General: Thin WM who is sedated   HEENT: Has NGT .   Lungs: Sats 100%   Abdomen: Soft, rare BS   Wound: Left inguinal incision okay.  Old midline scar.   Lab Results:    Recent Labs    06/18/18 1226 06/19/18 0316  WBC 5.5 7.0  HGB 10.6* 9.2*  HCT 32.6* 27.3*  PLT 242 210    BMET   Recent Labs    06/18/18 0441  06/18/18 1226 06/19/18 0316  NA 132*  --  132*  K 5.1  --  3.9  CL 94*  --  97*  CO2 27  --  26  GLUCOSE 95  --  201*  BUN 31*  --  27*  CREATININE 1.08 1.12 0.88  CALCIUM 8.9  --  7.9*    PT/INR  No results for input(s): LABPROT, INR in the last 72 hours.  ABG  No results for input(s): PHART, HCO3 in the last 72 hours.  Invalid input(s): PCO2, PO2   Studies/Results:  Dg Chest Port 1 View  Result Date: 06/18/2018 CLINICAL DATA:  Shortness of breath. EXAM: PORTABLE CHEST 1 VIEW COMPARISON:  Chest x-ray dated 06/15/2026 FINDINGS: NG tube tip is in the fundus of the stomach. Heart size and vascularity are normal. Small bilateral pleural effusions with probable atelectasis at the left base medially. Slight aortic atherosclerosis. IMPRESSION: Small new bilateral pleural effusions with probable atelectasis at the left base. Electronically Signed   By: Lorriane Shire M.D.   On: 06/18/2018 14:56   Dg Abd Portable 1v  Result Date: 06/17/2018 CLINICAL DATA:  Nasogastric tube placement. EXAM: PORTABLE ABDOMEN - 1 VIEW COMPARISON:  Radiograph of June 15, 2018. FINDINGS: Distal tip of  nasogastric tube is seen in proximal stomach. Dilated small bowel loops are again noted concerning for distal small bowel obstruction. Stool is noted in right colon. No colonic dilatation is noted. Pancreatic calcifications are noted suggesting chronic pancreatitis. IMPRESSION: Distal tip of nasogastric tube seen in proximal stomach. Dilated small bowel loops are again noted concerning for distal small bowel obstruction. Electronically Signed   By: Marijo Conception, M.D.   On: 06/17/2018 16:34     Anti-infectives:   Anti-infectives (From admission, onward)   Start     Dose/Rate Route Frequency Ordered Stop   06/18/18 0600  ceFAZolin (ANCEF) IVPB 2g/100 mL premix  Status:  Discontinued     2 g 200 mL/hr over 30 Minutes Intravenous On call to O.R. 06/18/18 0531 06/18/18 1211      Alphonsa Overall, MD, FACS Pager:  [place] [facility] Office: [phone] 06/19/2018

## 2018-06-19 NOTE — Progress Notes (Signed)
NAME:  [name], MRN:  [id number], DOB:  1951-02-25, LOS: 5 ADMISSION DATE:  06/13/2018, CONSULTATION DATE:  06/18/18 REFERRING MD:  Ernst Spell CHIEF COMPLAINT:  Hypoxia, agitation   Brief History   68 yo M who is POD 0 from inguinal hernia repair, after presenting 1/5 with abdominal pain, bilateral inguinal hernias with small bowel obstruction. Underwent hernia repair 1/20 Post operatively patient is agitated and ha SpO2 high 80s to low 90s; PCCM asked to consult for hypoxia.  Past Medical History  COPD (FEV1 49% in 2016)  HTN PVD NSCLC EtOH abuse Recurrent inguinal hernia  Significant Hospital Events   1/5> Admission 1/10> L inguinal hernia repair. Transfer to SDU for hypoxia post-operative  Consults:  Surgery PCCM  Procedures:  1/10> L inguinal hernia repair  Significant Diagnostic Tests:  1/5 CT a/p> bilatera hiatal hernia, distal small bowel obstruction. Transition point located within L inguinal hernia. R hiatal hernia containing ascites  1/6 KUB> enteric tube placement 1/10 CXR>> RLL opacity  Micro Data:  1/10 MRSA> neg  Antimicrobials:    Interim history/subjective:  Stable overnight.  Continues on 5 L oxygen Given ativan today and is sedated on my examnation.  Objective   Blood pressure (!) 156/76, pulse 69, temperature 97.8 F (36.6 C), temperature source Axillary, resp. rate 13, height 5\' 2"  (1.575 m), weight 40.5 kg, SpO2 100 %.        Intake/Output Summary (Last 24 hours) at 06/19/2018 0906 Last data filed at 06/19/2018 0415 Gross per 24 hour  Intake 3212.97 ml  Output 3135 ml  Net 77.97 ml   Filed Weights   06/13/18 1849 06/18/18 0924 06/18/18 1437  Weight: 39.9 kg 38.1 kg 40.5 kg   Examination: Gen:      No acute distress HEENT:  EOMI, sclera anicteric Neck:     No masses; no thyromegaly Lungs:    Bilateral rhonchorous breath sounds.  No wheeze appreciated. CV:         Regular rate and rhythm; no murmurs Abd:      + bowel sounds; soft,  non-tender; no palpable masses, no distension Ext:    Trace edema; adequate peripheral perfusion Skin:      Warm and dry; no rash Neuro: [facility] Problem list   SBO   Assessment & Plan:   COPD Postop hypoxia, likely secondary to atelectasis, chest congestion with poor mucociliary clearance Does not appear to be in active COPD exacerbation P Continue to wean down oxygen. Now on 3 Lt O2 Agree with diuresis to help with pleural effusion Incentive spirometry, chest PT Continue Pulmicort and duo nebs.  Mucinex for mucociliary clearance. He will need follow-up as outpatient for evaluation and management of severe COPD Nicotine patch, lozenges  Delirium  Due to advanced age, CNS depressing medications, multiple comorbidities  P Strict sleep hygiene, frequent re-orientation Minimize CNS depressing medications Remove NG tube and foley's Get OOB and mobilize  HTN P Per primary: labetaol 10mg  q8 and PRN hydral  Rest per primary PCCM will follow back on Monday 1/13. Please call with question over the weekend if needed  Best practice:  Diet: NPO, sips with meds and ice chips  Pain/Anxiety/Delirium protocol (if indicated): tylenol, dilaudid VAP protocol (if indicated): n/a DVT prophylaxis: lovenox GI prophylaxis: pepcid Glucose control: n/a Mobility: OOB Code Status: full Family Communication: girlfriend at bedside Disposition: SDU  Labs   CBC: Recent Labs  Lab 06/16/18 0420 06/17/18 0827 06/18/18 0441 06/18/18 1226 06/19/18 0316  WBC 1.7* 2.6* 4.4 5.5 7.0  NEUTROABS  --   --  3.2  --   --   HGB 11.3* 11.2* 11.5* 10.6* 9.2*  HCT 33.9* 33.6* 34.3* 32.6* 27.3*  MCV 98.3 98.2 94.8 97.0 96.1  PLT 174 205 252 242 379    Basic Metabolic Panel: Recent Labs  Lab 06/15/18 0513 06/16/18 0420 06/17/18 0827 06/18/18 0441 06/18/18 1226 06/19/18 0316  NA 132* 128* 126* 132*  --  132*  K 3.6 4.5 4.6 5.1  --  3.9  CL 93* 91* 92* 94*  --  97*  CO2 29 26  27 27   --  26  GLUCOSE 150* 125* 160* 95  --  201*  BUN 15 27* 27* 31*  --  27*  CREATININE 0.75 1.27* 1.07 1.08 1.12 0.88  CALCIUM 8.8* 8.8* 8.7* 8.9  --  7.9*  MG  --   --   --  1.8  --   --    GFR: Estimated Creatinine Clearance: 47.3 mL/min (by C-G formula based on SCr of 0.88 mg/dL). Recent Labs  Lab 06/17/18 0827 06/18/18 0441 06/18/18 1226 06/19/18 0316  WBC 2.6* 4.4 5.5 7.0    Liver Function Tests: Recent Labs  Lab 06/13/18 1924  AST 36  ALT 18  ALKPHOS 98  BILITOT 0.4  PROT 8.1  ALBUMIN 4.0   Recent Labs  Lab 06/13/18 1924  LIPASE 53*   No results for input(s): AMMONIA in the last 168 hours.  ABG No results found for: PHART, PCO2ART, PO2ART, HCO3, TCO2, ACIDBASEDEF, O2SAT   Coagulation Profile: No results for input(s): INR, PROTIME in the last 168 hours.  Cardiac Enzymes: No results for input(s): CKTOTAL, CKMB, CKMBINDEX, TROPONINI in the last 168 hours.  HbA1C: No results found for: HGBA1C  CBG: Recent Labs  Lab 06/18/18 1712  GLUCAP 115*   Marshell Garfinkel MD [facility] Pager [phone] If no answer call [phone] 06/19/2018, 9:07 AM

## 2018-06-20 ENCOUNTER — Inpatient Hospital Stay (HOSPITAL_COMMUNITY): Payer: Medicare Other

## 2018-06-20 LAB — BASIC METABOLIC PANEL
Anion gap: 10 (ref 5–15)
BUN: 19 mg/dL (ref 8–23)
CO2: 28 mmol/L (ref 22–32)
Calcium: 8.4 mg/dL — ABNORMAL LOW (ref 8.9–10.3)
Chloride: 94 mmol/L — ABNORMAL LOW (ref 98–111)
Creatinine, Ser: 0.72 mg/dL (ref 0.61–1.24)
GFR calc Af Amer: >60 mL/min (ref >60)
Glucose, Bld: 107 mg/dL — ABNORMAL HIGH (ref 70–99)
Potassium: 3.7 mmol/L (ref 3.5–5.1)
Sodium: 132 mmol/L — ABNORMAL LOW (ref 135–145)

## 2018-06-20 LAB — CBC
HEMATOCRIT: 28.1 % — AB (ref 39.0–52.0)
Hemoglobin: 9.3 g/dL — ABNORMAL LOW (ref 13.0–17.0)
MCH: 32.1 pg (ref 26.0–34.0)
MCHC: 33.1 g/dL (ref 30.0–36.0)
MCV: 96.9 fL (ref 80.0–100.0)
Platelets: 238 10*3/uL (ref 150–400)
RBC: 2.9 MIL/uL — ABNORMAL LOW (ref 4.22–5.81)
RDW: 14.6 % (ref 11.5–15.5)
WBC: 9.9 10*3/uL (ref 4.0–10.5)
nRBC: 0 % (ref 0.0–0.2)

## 2018-06-20 LAB — MAGNESIUM: Magnesium: 1.3 mg/dL — ABNORMAL LOW (ref 1.7–2.4)

## 2018-06-20 MED ORDER — NICOTINE POLACRILEX 4 MG MT LOZG: 4.0000 mg | LOZENGE | OROMUCOSAL | Status: DC | PRN

## 2018-06-20 MED ORDER — OLANZAPINE 5 MG PO TABS
2.5000 mg | ORAL_TABLET | Freq: Every day | ORAL | Status: DC
  Administered 2018-06-20: 2.5 mg via ORAL
  Filled 2018-06-20: qty 1

## 2018-06-20 MED ORDER — MAGNESIUM SULFATE 2 GM/50ML IV SOLN
2.0000 g | Freq: Once | INTRAVENOUS | Status: AC
  Administered 2018-06-20: 2 g via INTRAVENOUS
  Filled 2018-06-20: qty 50

## 2018-06-20 MED ORDER — DM-GUAIFENESIN ER 30-600 MG PO TB12: 1.0000 | ORAL_TABLET | Freq: Two times a day (BID) | ORAL | Status: DC | PRN

## 2018-06-20 MED ORDER — METOPROLOL SUCCINATE ER 50 MG PO TB24
50.0000 mg | ORAL_TABLET | Freq: Every day | ORAL | Status: DC
  Administered 2018-06-20 – 2018-06-21 (×2): 50 mg via ORAL
  Filled 2018-06-20: qty 1
  Filled 2018-06-20: qty 2

## 2018-06-20 MED ORDER — AMLODIPINE BESYLATE 10 MG PO TABS
10.0000 mg | ORAL_TABLET | Freq: Every day | ORAL | Status: DC
  Administered 2018-06-21 – 2018-06-22 (×2): 10 mg via ORAL
  Filled 2018-06-20 (×2): qty 1

## 2018-06-20 MED ORDER — GUAIFENESIN ER 600 MG PO TB12
600.0000 mg | ORAL_TABLET | Freq: Two times a day (BID) | ORAL | Status: DC
  Administered 2018-06-20 – 2018-06-21 (×3): 600 mg via ORAL
  Filled 2018-06-20 (×3): qty 1

## 2018-06-20 MED ORDER — AMLODIPINE BESYLATE 5 MG PO TABS
5.0000 mg | ORAL_TABLET | Freq: Every day | ORAL | Status: DC
  Administered 2018-06-20: 5 mg via ORAL
  Filled 2018-06-20: qty 1

## 2018-06-20 NOTE — Progress Notes (Addendum)
[facility]:  [phone] General Surgery Progress Note   LOS: 6 days  POD -  2 Days Post-Op  Chief Complaint: Abdominal pain  Assessment and Plan: 1.  OPEN RECURRENT LEFT INGUINAL HERNIA REPAIR WITH MESH - 06/18/2018 - [facility]  Start clear liquids  2.  Has right inguinal hernia 3.  HTN  Restart oral meds 4.  COPD/smokes  Congestion, poor inspiratory effort 5.  History of lung ca - s/p radiation 6.  PVD s/p stenting of right leg - Early 7.  DVT prophylaxis - Lovenox 8.  History of being on Plavix 9.  Confused/encephalopathy -   Awake, but confused  Fell yesterday - CT scan - negative except atrophy 10.  K+ - 3.7 - 06/20/2018   Principal Problem:   Incarcerated left inguinal hernia s/p repair 06/18/2018 Active Problems:   Smoker   COPD (chronic obstructive pulmonary disease) (HCC)   Essential hypertension   Protein-calorie malnutrition, severe (HCC)   Severe claudication (HCC)   PAD (peripheral artery disease) (HCC)   Acute hypoxemic respiratory failure (HCC)   Acute metabolic encephalopathy   SBO (small bowel obstruction) (HCC)   Subjective:  Confused, but knows to ask for coffee.       Girlfriend, Resa Miner, at bedside.  Objective:   Vitals:   06/20/18 0645 06/20/18 0700  BP: (!) 172/91 (!) 194/63  Pulse: 91 90  Resp: 17 17  Temp:    SpO2: 92% 98%     Intake/Output from previous day:  01/11 0701 - 01/12 0700 In: 1537.6 [I.V.:1537.6] Out: 900 [Urine:900]  Intake/Output this shift:  No intake/output data recorded.   Physical Exam:   General: Thin WM who is alert and agitated.  He is confused.  Has no idea where he is.   HEENT: Negative .   Lungs: Does not understand IS.   Abdomen: Soft, rare BS   Wound: Left inguinal incision okay.  Old midline scar.   Lab Results:    Recent Labs    06/19/18 0316 06/20/18 0302  WBC 7.0 9.9  HGB 9.2* 9.3*  HCT 27.3* 28.1*  PLT 210 238    BMET   Recent Labs    06/19/18 0316  06/20/18 0302  NA 132* 132*  K 3.9 3.7  CL 97* 94*  CO2 26 28  GLUCOSE 201* 107*  BUN 27* 19  CREATININE 0.88 0.72  CALCIUM 7.9* 8.4*    PT/INR  No results for input(s): LABPROT, INR in the last 72 hours.  ABG   Recent Labs    06/19/18 1844  PHART 7.444  HCO3 30.5*     Studies/Results:  Ct Head Wo Contrast  Result Date: 06/19/2018 CLINICAL DATA:  Initial evaluation for acute unexplained confusion. EXAM: CT HEAD WITHOUT CONTRAST TECHNIQUE: Contiguous axial images were obtained from the base of the skull through the vertex without intravenous contrast. COMPARISON:  Prior MRI from 05/23/2017. FINDINGS: Brain: Generalized age-related cerebral atrophy with mild chronic microvascular ischemic disease. Previously noted lesion involving the splenium and corpus callosum not well seen on this exam. No acute intracranial hemorrhage. No acute large vessel territory infarct. No mass lesion, midline shift or mass effect. No hydrocephalus. No extra-axial fluid collection. Vascular: No hyperdense vessel. Scattered vascular calcifications noted within the carotid siphons. Skull: Scalp soft tissues and calvarium within normal limits. Sinuses/Orbits: Globes and orbital soft tissues demonstrate no acute finding. Scattered mucosal thickening within the ethmoidal air cells. Pneumatized secretions noted within the right maxillary sinus. Left mastoid effusion  noted. Other: None. IMPRESSION: 1. No acute intracranial abnormality. 2. Atrophy with mild chronic small vessel ischemic disease. 3. Ethmoidal and right maxillary sinusitis. Electronically Signed   By: Jeannine Boga M.D.   On: 06/19/2018 18:36   Dg Chest Port 1 View  Result Date: 06/18/2018 CLINICAL DATA:  Shortness of breath. EXAM: PORTABLE CHEST 1 VIEW COMPARISON:  Chest x-ray dated 06/15/2026 FINDINGS: NG tube tip is in the fundus of the stomach. Heart size and vascularity are normal. Small bilateral pleural effusions with probable atelectasis at  the left base medially. Slight aortic atherosclerosis. IMPRESSION: Small new bilateral pleural effusions with probable atelectasis at the left base. Electronically Signed   By: Lorriane Shire M.D.   On: 06/18/2018 14:56     Anti-infectives:   Anti-infectives (From admission, onward)   Start     Dose/Rate Route Frequency Ordered Stop   06/18/18 0600  ceFAZolin (ANCEF) IVPB 2g/100 mL premix  Status:  Discontinued     2 g 200 mL/hr over 30 Minutes Intravenous On call to O.R. 06/18/18 0531 06/18/18 1211      Alphonsa Overall, MD, FACS Pager: [facility]: [phone] 06/20/2018

## 2018-06-20 NOTE — Progress Notes (Signed)
Pt sitting in chair at this time will hold cpt until pt is back in bed per family request.

## 2018-06-20 NOTE — Progress Notes (Signed)
   06/20/18 0610  What Happened  Was fall witnessed? No  Was patient injured? No  Patient found on floor  Found by Staff-comment (Satya Bohall)  Stated prior activity bathroom-unassisted  Follow Up  MD notified Jeannette Corpus  Time MD notified [phone]  Family notified Yes-comment Charlynn Grimes - girlfriend)  Time family notified [phone]  Additional tests Yes-comment (CT)  Progress note created (see row info) Yes  Adult Fall Risk Assessment  Risk Factor Category (scoring not indicated) Fall has occurred during this admission (document High fall risk)  Adult Fall Risk Interventions  Required Bundle Interventions *See Row Information* High fall risk - low, moderate, and high requirements implemented  Additional Interventions Family Supervision;Use of appropriate toileting equipment (bedpan, BSC, etc.);Reorient/diversional activities with confused patients  Screening for Fall Injury Risk (To be completed on HIGH fall risk patients) - Assessing Need for Low Bed  Risk For Fall Injury- Low Bed Criteria Previous fall this admission  Will Implement Low Bed and Floor Mats Low bed contraindicated, floor mats in place  Specialty Low Bed Contraindicated Requires therapeutic low air loss mattress  Screening for Fall Injury Risk (To be completed on HIGH fall risk patients who do not meet crieteria for Low Bed) - Assessing Need for Floor Mats Only  Risk For Fall Injury- Criteria for Floor Mats Confusion/dementia (+CAM, CIWA, TBI, etc.);Noncompliant with safety precautions  Pain Assessment  Pain Scale 0-10  Pain Score 0  PCA/Epidural/Spinal Assessment  Respiratory Pattern Regular;Unlabored  Neurological  Neuro (WDL) X  Level of Consciousness Alert  Orientation Level Oriented to person;Oriented to time;Disoriented to place;Disoriented to time  Economist  Pupil Assessment  Yes  R Pupil Size (mm) 3  R Pupil Shape  Round  R Pupil Reaction Brisk  L Pupil Size (mm) 3  L Pupil Shape Round  L Pupil Reaction Brisk  Additional Pupil Assessments No  Motor Function/Sensation Assessment Grip;Motor response  R Hand Grip Present;Moderate  L Hand Grip Present;Moderate   RUE Motor Response Purposeful movement;Responds to commands  LUE Motor Response Purposeful movement;Responds to commands  RLE Motor Response Purposeful movement;Responds to commands  LLE Motor Response Purposeful movement;Responds to commands  Neuro Symptoms None  Neuro Additional Assessments No  Neuro Additional Assessments No  Glasgow Coma Scale  Eye Opening 4  Best Verbal Response (NON-intubated) 4  Best Motor Response 6  Glasgow Coma Scale Score 14  Musculoskeletal  Musculoskeletal (WDL) X  Assistive Device BSC  Generalized Weakness Yes  Weight Bearing Restrictions No  Integumentary  Integumentary (WDL) X  Skin Color Appropriate for ethnicity  Skin Condition Dry;Flaky  Skin Integrity Intact;Surgical Incision (see LDA)  Skin Turgor Non-tenting

## 2018-06-20 NOTE — Progress Notes (Signed)
Hospitalist consult PROGRESS NOTE  [name] DVV:[id number] DOB: [birth year] DOA: 06/13/2018 PCP: [name], MD  HPI/Recap of past 24 hours:   Alert but confused, compulsive, constantly wants to get up, wants a cigarette , he is allergic to nicotine patch he fell around 6 am, he pulled out his iv He is asking for coffee Has congested cough, on 6liter oxygen ( per RN significant other trying to increase o2 supplement), no fever Had several bm last night per RN  He is started on cleared by general surgery  Girlfriend who is a home health RN at bedside  Assessment/Plan: Principal Problem:   Incarcerated left inguinal hernia s/p repair 06/18/2018 Active Problems:   Smoker   COPD (chronic obstructive pulmonary disease) ([place])   Essential hypertension   Protein-calorie malnutrition, severe ([place])   Severe claudication ([place])   PAD (peripheral artery disease) ([place])   Acute hypoxemic respiratory failure ([place])   Acute metabolic encephalopathy   SBO (small bowel obstruction) ([place])  Postop hypoxia  -Patient has poor pulmonary reserve due to advanced COPD, continued smoker, now status post abdomen surgery, with increased confusion, at risk of decompensation, he is transferred to stepdown unit on 1/10 -stat chest x-ray obtained on 1/10 showed "Small new bilateral pleural effusions with probable atelectasis at the left base" he received lasix 20mg  iv x1 on 1/10 and 20mg  iv x1 on 1/11 with good diruesis -critical care consulted, input appreciated -repeat cxr new right infrahilar consolidation, but overall improving, continue nebs /mucinex/incentive spirometer/ ambulation   Acute metabolic encephalopathy: Likely multifactorial , alcohol withdrawal? Hyponatremia, surgery with anesthesia, inhospital delirium -per family patient has h/o confusion in the past during hospitalization -ct head no acute findings, lactic acid wnl, blood culture in process - avoid dilaudid ,ativan , d/c  tele -start low dose zyprexa -safety sitter at bedside  SBO 2/2 left inguinal hernia --On general surgery service, Plavix held for 5 days, he is brought to the OR  On 1/10 and underwent OPEN RECURRENT LEFT INGUINAL HERNIA REPAIR WITH MESH (Left), INSERTION OF MESH (Left) -Resume plavix when oks with general surgery -plan per general surgery   H/o internal hemorrhoids -Seen by Dr. Thana Farr in April 2019, s/p rubber band ligation  -Blood per rectum this morning, denies abdominal pain -Defer to general surgery  Hyponatremia: Sodium nadir at 126, improving at 132 after ivf adjustment, now off ivf, encourage oral intake  Hypomagnesemia: replace mag  COPD/heavy smoker -Not on home O2 -Likely will benefit O2 at discharge ( per RN prior to surgery patient o2 dropped to the 70's on room air  while Ambulating with one person  Assist to the bathroom) -need outpatient follow up for COPD  Left upper lobe lung cancer  Lung nodules x2 left upper lobe and 1 left lower lobe s/p XRT -Case discussed with radiation oncology Dr. Tammi Klippel who stated patient has stage Ia status post curative intent treatment XRT finished in July 2019 -Per Dr. Tammi Klippel patient missed his restaging scan, Dr. Tammi Klippel recommended CT chest with IV contrast prior to discharge, and follow with him every 52-month.  PVD Has an appointment with vascular surgery Dr Donnetta Hutching on 2/11 Girl friend reports patient has an appointment with Dr Donnetta Hutching on 1/6 however patient was admitted to the hospital Girl friend reports patient has increase pain on left leg while walking, she request vascular being consulted while patient is in the hospital, will contact vascular surgery on Monday.  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    Code Status: full  Family Communication: patient and girlfriend at bedside  Disposition Plan: transfer to med surge   Consultants:  General surgery as primary  hospitalist as consultant  Cardiology as consultant  Procedures:  Ng placement  Antibiotics:  none   Objective: BP (!) 194/63   Pulse 90   Temp 97.6 F (36.4 C) (Oral)   Resp 17   Ht 5\' 2"  (1.575 m)   Wt 40.5 kg   SpO2 95%   BMI 16.33 kg/m   Intake/Output Summary (Last 24 hours) at 06/20/2018 0755 Last data filed at 06/20/2018 0555 Gross per 24 hour  Intake 1537.58 ml  Output 900 ml  Net 637.58 ml   Filed Weights   06/13/18 1849 06/18/18 0924 06/18/18 1437  Weight: 39.9 kg 38.1 kg 40.5 kg    Exam: Patient is examined daily including today on 06/20/2018, exams remain the same as of yesterday except that has changed    General:  Thin, frail, chronically ill appearing, fully alert, slight confusion, cooperative  Cardiovascular: RRR  Respiratory: diminished at basis, no wheezing  Abdomen: post op changes, diminished bowel sounds  Musculoskeletal: No Edema  Neuro: alert, cooperative   Data Reviewed: Basic Metabolic Panel: Recent Labs  Lab 06/16/18 0420 06/17/18 0827 06/18/18 0441 06/18/18 1226 06/19/18 0316 06/20/18 0302  NA 128* 126* 132*  --  132* 132*  K 4.5 4.6 5.1  --  3.9 3.7  CL 91* 92* 94*  --  97* 94*  CO2 26 27 27   --  26 28  GLUCOSE 125* 160* 95  --  201* 107*  BUN 27* 27* 31*  --  27* 19  CREATININE 1.27* 1.07 1.08 1.12 0.88 0.72  CALCIUM 8.8* 8.7* 8.9  --  7.9* 8.4*  MG  --   --  1.8  --   --  1.3*   Liver Function Tests: Recent Labs  Lab 06/13/18 1924  AST 36  ALT 18  ALKPHOS 98  BILITOT 0.4  PROT 8.1  ALBUMIN 4.0   Recent Labs  Lab 06/13/18 1924  LIPASE 53*   No results for input(s):  AMMONIA in the last 168 hours. CBC: Recent Labs  Lab 06/17/18 0827 06/18/18 0441 06/18/18 1226 06/19/18 0316 06/20/18 0302  WBC 2.6* 4.4 5.5 7.0 9.9  NEUTROABS  --  3.2  --   --   --   HGB 11.2* 11.5* 10.6* 9.2* 9.3*  HCT 33.6* 34.3* 32.6* 27.3* 28.1*  MCV 98.2 94.8 97.0 96.1 96.9  PLT 205 252 242 210 238   Cardiac Enzymes:   No results for input(s): CKTOTAL, CKMB, CKMBINDEX, TROPONINI in the last 168 hours. BNP (last 3 results) No results for input(s): BNP in the last 8760 hours.  ProBNP (last 3 results) No results for input(s): PROBNP in the last 8760 hours.  CBG: Recent Labs  Lab 06/18/18 1712  GLUCAP 115*    Recent Results (from the past 240 hour(s))  Surgical PCR screen     Status: None   Collection Time: 06/18/18  1:47 AM  Result Value Ref Range Status   MRSA, PCR NEGATIVE NEGATIVE Final   Staphylococcus aureus NEGATIVE NEGATIVE  Final    Comment: (NOTE) The Xpert SA Assay (FDA approved for NASAL specimens in patients 49 years of age and older), is one component of a comprehensive surveillance program. It is not intended to diagnose infection nor to guide or monitor treatment. Performed at [facility], [place] [address], [place], [place] [address]   Culture, blood (routine x 2)     Status: None (Preliminary result)   Collection Time: 06/19/18  7:54 PM  Result Value Ref Range Status   Specimen Description BLOOD LEFT ANTECUBITAL  Final   Special Requests   Final    BOTTLES DRAWN AEROBIC ONLY Blood Culture adequate volume Performed at [facility] [address] [place], [place] [address]    Culture PENDING  Incomplete   Report Status PENDING  Incomplete     Studies: Ct Head Wo Contrast  Result Date: 06/19/2018 CLINICAL DATA:  Initial evaluation for acute unexplained confusion. EXAM: CT HEAD WITHOUT CONTRAST TECHNIQUE: Contiguous axial images were obtained from the base of the skull through the vertex without  intravenous contrast. COMPARISON:  Prior MRI from 05/23/2017. FINDINGS: Brain: Generalized age-related cerebral atrophy with mild chronic microvascular ischemic disease. Previously noted lesion involving the splenium and corpus callosum not well seen on this exam. No acute intracranial hemorrhage. No acute large vessel territory infarct. No mass lesion, midline shift or mass effect. No hydrocephalus. No extra-axial fluid collection. Vascular: No hyperdense vessel. Scattered vascular calcifications noted within the carotid siphons. Skull: Scalp soft tissues and calvarium within normal limits. Sinuses/Orbits: Globes and orbital soft tissues demonstrate no acute finding. Scattered mucosal thickening within the ethmoidal air cells. Pneumatized secretions noted within the right maxillary sinus. Left mastoid effusion noted. Other: None. IMPRESSION: 1. No acute intracranial abnormality. 2. Atrophy with mild chronic small vessel ischemic disease. 3. Ethmoidal and right maxillary sinusitis. Electronically Signed   By: Jeannine Boga M.D.   On: 06/19/2018 18:36    Scheduled Meds: . amLODipine  5 mg Oral Daily  . budesonide (PULMICORT) nebulizer solution  0.5 mg Nebulization BID  . dextromethorphan-guaiFENesin  1 tablet Oral BID  . enoxaparin (LOVENOX) injection  30 mg Subcutaneous Q24H  . folic acid  1 mg Oral Daily  . hydrocortisone-pramoxine   Rectal QID  . ipratropium-albuterol  3 mL Nebulization TID  . labetalol  10 mg Intravenous Q8H  . mouth rinse  15 mL Mouth Rinse BID  . metoprolol succinate  50 mg Oral Daily  . multivitamin with minerals  1 tablet Oral Daily  . mupirocin ointment  1 application Nasal BID  . nicotine  14 mg Transdermal Daily  . OLANZapine  2.5 mg Oral Daily  . thiamine  100 mg Oral Daily   Or  . thiamine  100 mg Intravenous Daily    Continuous Infusions: . sodium chloride 250 mL (06/16/18 1114)  . dextrose 5 % and 0.45 % NaCl with KCl 20 mEq/L 100 mL/hr at 06/19/18 2344   . famotidine (PEPCID) IV Stopped (06/19/18 2051)  . magnesium sulfate 1 - 4 g bolus IVPB    . methocarbamol (ROBAXIN) IV 500 mg (06/16/18 2304)    Time spent: 70mins I have personally reviewed and interpreted on  06/19/2018 daily labs,  imagings as discussed above under date review session and assessment and plans.  I reviewed all nursing notes, pharmacy notes, consultant notes,  vitals, pertinent old records  I have discussed plan of care as described above with RN , patient  and family on  06/19/2018  Florencia Reasons MD, PhD  [facility] Pager [phone]. If 7PM-7AM, please contact night-coverage at [email or web address], password [facility] 06/20/2018, 7:55 AM  LOS: 6 days

## 2018-06-21 ENCOUNTER — Encounter (HOSPITAL_COMMUNITY): Payer: Self-pay | Admitting: Surgery

## 2018-06-21 ENCOUNTER — Inpatient Hospital Stay (HOSPITAL_COMMUNITY): Payer: Medicare Other

## 2018-06-21 DIAGNOSIS — K4030 Unilateral inguinal hernia, with obstruction, without gangrene, not specified as recurrent: Secondary | ICD-10-CM

## 2018-06-21 DIAGNOSIS — J438 Other emphysema: Secondary | ICD-10-CM

## 2018-06-21 DIAGNOSIS — J9601 Acute respiratory failure with hypoxia: Secondary | ICD-10-CM

## 2018-06-21 DIAGNOSIS — M79605 Pain in left leg: Secondary | ICD-10-CM

## 2018-06-21 DIAGNOSIS — R0602 Shortness of breath: Secondary | ICD-10-CM

## 2018-06-21 LAB — BASIC METABOLIC PANEL
Anion gap: 10 (ref 5–15)
BUN: 15 mg/dL (ref 8–23)
CO2: 34 mmol/L — ABNORMAL HIGH (ref 22–32)
Calcium: 8.8 mg/dL — ABNORMAL LOW (ref 8.9–10.3)
Chloride: 88 mmol/L — ABNORMAL LOW (ref 98–111)
Creatinine, Ser: 0.68 mg/dL (ref 0.61–1.24)
GFR calc Af Amer: >60 mL/min (ref >60)
GFR calc non Af Amer: >60 mL/min (ref >60)
Glucose, Bld: 68 mg/dL — ABNORMAL LOW (ref 70–99)
Potassium: 3.3 mmol/L — ABNORMAL LOW (ref 3.5–5.1)
Sodium: 132 mmol/L — ABNORMAL LOW (ref 135–145)

## 2018-06-21 LAB — CBC
HCT: 34.3 % — ABNORMAL LOW (ref 39.0–52.0)
Hemoglobin: 11.5 g/dL — ABNORMAL LOW (ref 13.0–17.0)
MCH: 32.3 pg (ref 26.0–34.0)
MCHC: 33.5 g/dL (ref 30.0–36.0)
MCV: 96.3 fL (ref 80.0–100.0)
Platelets: 300 10*3/uL (ref 150–400)
RBC: 3.56 MIL/uL — ABNORMAL LOW (ref 4.22–5.81)
RDW: 14.5 % (ref 11.5–15.5)
WBC: 14.7 10*3/uL — ABNORMAL HIGH (ref 4.0–10.5)
nRBC: 0 % (ref 0.0–0.2)

## 2018-06-21 LAB — MAGNESIUM: Magnesium: 1.8 mg/dL (ref 1.7–2.4)

## 2018-06-21 MED ORDER — GUAIFENESIN ER 600 MG PO TB12
1200.0000 mg | ORAL_TABLET | Freq: Two times a day (BID) | ORAL | Status: DC
  Administered 2018-06-21 – 2018-06-22 (×2): 1200 mg via ORAL
  Filled 2018-06-21 (×3): qty 2

## 2018-06-21 MED ORDER — POTASSIUM CHLORIDE CRYS ER 20 MEQ PO TBCR
20.0000 meq | EXTENDED_RELEASE_TABLET | Freq: Once | ORAL | Status: AC
  Administered 2018-06-21: 20 meq via ORAL
  Filled 2018-06-21: qty 1

## 2018-06-21 MED ORDER — FAMOTIDINE 20 MG PO TABS
20.0000 mg | ORAL_TABLET | Freq: Every day | ORAL | Status: DC
  Administered 2018-06-21: 20 mg via ORAL
  Filled 2018-06-21: qty 1

## 2018-06-21 MED ORDER — KCL IN DEXTROSE-NACL 20-5-0.9 MEQ/L-%-% IV SOLN
INTRAVENOUS | Status: DC
  Administered 2018-06-21: 12:00:00 via INTRAVENOUS
  Filled 2018-06-21 (×2): qty 1000

## 2018-06-21 MED ORDER — KCL IN DEXTROSE-NACL 20-5-0.9 MEQ/L-%-% IV SOLN
INTRAVENOUS | Status: DC
  Administered 2018-06-21 – 2018-06-22 (×2): via INTRAVENOUS
  Filled 2018-06-21: qty 1000

## 2018-06-21 MED ORDER — KCL IN DEXTROSE-NACL 20-5-0.45 MEQ/L-%-% IV SOLN
INTRAVENOUS | Status: DC
  Administered 2018-06-21: 11:00:00 via INTRAVENOUS
  Filled 2018-06-21: qty 1000

## 2018-06-21 MED ORDER — ENSURE ENLIVE PO LIQD
237.0000 mL | Freq: Three times a day (TID) | ORAL | Status: DC
  Administered 2018-06-21 – 2018-06-25 (×3): 237 mL via ORAL

## 2018-06-21 MED ORDER — IOPAMIDOL (ISOVUE-370) INJECTION 76%
100.0000 mL | Freq: Once | INTRAVENOUS | Status: AC | PRN
  Administered 2018-06-21: 100 mL via INTRAVENOUS

## 2018-06-21 MED ORDER — MAGNESIUM SULFATE 2 GM/50ML IV SOLN
2.0000 g | Freq: Once | INTRAVENOUS | Status: AC
  Administered 2018-06-21: 2 g via INTRAVENOUS
  Filled 2018-06-21: qty 50

## 2018-06-21 MED ORDER — SODIUM CHLORIDE (PF) 0.9 % IJ SOLN
INTRAMUSCULAR | Status: AC
  Filled 2018-06-21: qty 50

## 2018-06-21 NOTE — Care Management Important Message (Signed)
Important Message  Patient Details  Name: [name] MRN: [id number] Date of Birth: May 11, 1951   Medicare Important Message Given:  Yes    Kerin Salen 06/21/2018, 11:18 AMImportant Message  Patient Details  Name: [name] MRN: [id number] Date of Birth: 05-15-1951   Medicare Important Message Given:  Yes    Kerin Salen 06/21/2018, 11:18 AM

## 2018-06-21 NOTE — Progress Notes (Signed)
   06/20/18 2008  Vitals  BP (!) 182/94   NT will reassess in 15 min.

## 2018-06-21 NOTE — Progress Notes (Signed)
Pharmacy IV to PO conversion  The patient is ordered Diphenhydramine by the intravenous route with a linked PO option available. The patient is also receiving Pepcid by the intravenous route. Based on criteria approved by the Pharmacy and [facility], these medication is being converted to the equivalent oral dose form or the IV linked order discontinued.   No active GI bleeding or impaired absorption  Not s/p esophagectomy  Documented ability to take oral medications for > 24 hr  Plan to continue treatment for at least 1 day  If you have any questions about this conversion, please contact the Pharmacy Department (ext [phone]).  Thank you.  Reuel Boom, PharmD, BCPS [phone] 06/21/2018, 12:29 PM

## 2018-06-21 NOTE — Plan of Care (Signed)
  Problem: Clinical Measurements: Goal: Will remain free from infection Outcome: Progressing Goal: Respiratory complications will improve Outcome: Progressing   Problem: Coping: Goal: Level of anxiety will decrease Outcome: Progressing   Problem: Elimination: Goal: Will not experience complications related to urinary retention Outcome: Progressing   Problem: Pain Managment: Goal: General experience of comfort will improve Outcome: Progressing   Problem: Safety: Goal: Ability to remain free from injury will improve Outcome: Progressing

## 2018-06-21 NOTE — Progress Notes (Signed)
Hospitalist consult PROGRESS NOTE  [name] NAT:[id number] DOB: Nov 06, 1950 DOA: 06/13/2018 PCP: [name], MD  HPI/Recap of past 24 hours:    confused, only oriented to person, but currently calm,   Has congested cough, on 4liter oxygen ( per RN significant other trying to increase o2 supplement), no fever Had several bm last night per RN  He ambulated a few steps with PT, returned to bed due to c/o feeling dizzy  He is started on full liquid by general surgery  Girlfriend who is a home health RN at bedside  Assessment/Plan: Principal Problem:   Incarcerated left inguinal hernia s/p repair 06/18/2018 Active Problems:   Smoker   COPD (chronic obstructive pulmonary disease) ([place])   Essential hypertension   Protein-calorie malnutrition, severe ([place])   Severe claudication ([place])   PVD (peripheral vascular disease) ([place])   Acute hypoxemic respiratory failure ([place])   Acute metabolic encephalopathy   SBO (small bowel obstruction) ([place])  Postop hypoxia  -Patient has poor pulmonary reserve due to advanced COPD, continued smoker, now status post abdomen surgery, with increased confusion, at risk of decompensation, he is transferred to stepdown unit on 1/10 -stat chest x-ray obtained on 1/10 showed "Small new bilateral pleural effusions with probable atelectasis at the left base" he received lasix 20mg  iv x1 on 1/10 and 20mg  iv x1 on 1/11 with good diruesis -critical care consulted, input appreciated -repeat cxr new right infrahilar consolidation, but overall improving, continue nebs /mucinex/incentive spirometer/ ambulation -persistent hypoxia, girlfriends reports this is new, will check CTA chest    Acute metabolic encephalopathy: Likely multifactorial , alcohol withdrawal? Hyponatremia, surgery with anesthesia, inhospital delirium -per family patient has h/o confusion in the past during hospitalization -ct head no acute findings, lactic acid wnl, blood culture in process -  avoid dilaudid ,ativan , d/c tele -low dose zyprexa started on 1/12, girlfriend states it made him more agitated request zyprexa to be stopped. -need safety sitter at bedside for fall precaution   SBO 2/2 left inguinal hernia --On general surgery service, Plavix held for 5 days, he is brought to the OR  On 1/10 and underwent OPEN RECURRENT LEFT INGUINAL HERNIA REPAIR WITH MESH (Left), INSERTION OF MESH (Left) -Resume plavix when oks with general surgery -plan per general surgery   H/o internal hemorrhoids -Seen by Dr. Thana Farr in April 2019, s/p rubber band ligation  -Blood per rectum this morning, denies abdominal pain -Defer to general surgery  Hyponatremia: Sodium nadir at 126, improving at 132  encourage oral intake ivf restarted on 1/13, monitor sodium level  Hypokalemia: replace k  Hypomagnesemia: remain less than 2, continue to replace mag  COPD/heavy smoker -Not on home O2 -Likely will benefit O2 at discharge ( per RN prior to surgery patient o2 dropped to the 70's on room air  while Ambulating with one person  Assist to the bathroom) -need outpatient follow up for COPD  Left upper lobe lung cancer  Lung nodules x2 left upper lobe and 1 left lower lobe s/p XRT -Case discussed with radiation oncology Dr. Tammi Klippel who stated patient has stage Ia status post curative intent treatment XRT finished in July 2019 -Per Dr. Tammi Klippel patient missed his restaging scan, Dr. Tammi Klippel recommended CT chest with IV contrast prior to discharge, and follow with him every 44-month.   PAD s/p stent -PAD s/p Successful self expanding Eluvia 7.0 X 60 mm stent placement Rt external iliac artery on 03/30/2018 by cardiology Dr Virgina Jock,  -Has an appointment with vascular surgery  Dr Donnetta Hutching on 2/11 -Girl friend reports patient has an appointment with Dr Donnetta Hutching on 1/6 however patient was admitted to the hospital Girl friend reports patient has increase pain on left leg while walking, she request vascular  being consulted while patient is in the hospital, will contact vascular surgery.    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 Nutrition consult   Code Status: full  Family Communication: patient and girlfriend at bedside  Disposition Plan: home with home health in 2-3 days , pending clinical improvement, needs general surgery and critical care clearance   Consultants:  General surgery as primary  hospitalist as consultant  Cardiology as consultant  Pulmonary/critical care  Procedures:  Ng placement and removal  on 1/10 s/p OPEN RECURRENT LEFT INGUINAL HERNIA REPAIR WITH MESH (Left), INSERTION OF MESH (Left)   Antibiotics:  perioperative   Objective: BP (!) 159/90 (BP Location: Right Arm)   Pulse 85   Temp 98 F (36.7 C) (Oral)   Resp 14   Ht 5\' 2"  (1.575 m)   Wt 40.5 kg   SpO2 96%   BMI 16.33 kg/m   Intake/Output Summary (Last 24 hours) at 06/21/2018 1207 Last data filed at 06/21/2018 0944 Gross per 24 hour  Intake 187.33 ml  Output 1225 ml  Net -1037.67 ml   Filed Weights   06/13/18 1849 06/18/18 0924 06/18/18 1437  Weight: 39.9 kg 38.1 kg 40.5 kg    Exam: Patient is examined daily including today on 06/21/2018, exams remain the same as of yesterday except that has changed    General:  Thin, frail, chronically ill appearing, fully alert, remain confused but cooperative  Cardiovascular: RRR  Respiratory: diminished at basis, no wheezing  Abdomen: post op changes, nontender, + bowel sounds  Musculoskeletal: No Edema  Neuro: alert, cooperative , remain confused  Data Reviewed: Basic Metabolic Panel: Recent Labs  Lab 06/17/18 0827 06/18/18 0441  06/18/18 1226 06/19/18 0316 06/20/18 0302 06/21/18 0427  NA 126* 132*  --  132* 132* 132*  K 4.6 5.1  --  3.9 3.7 3.3*  CL 92* 94*  --  97* 94* 88*  CO2 27 27  --  26 28 34*  GLUCOSE 160* 95  --  201* 107* 68*  BUN 27* 31*  --  27* 19 15  CREATININE 1.07 1.08 1.12 0.88 0.72 0.68  CALCIUM 8.7* 8.9  --  7.9* 8.4* 8.8*  MG  --  1.8  --   --  1.3* 1.8   Liver Function Tests: No results for input(s): AST, ALT, ALKPHOS, BILITOT, PROT, ALBUMIN in the last 168 hours. No results for input(s): LIPASE, AMYLASE in the last 168 hours. No results for input(s): AMMONIA in the last 168 hours. CBC: Recent Labs  Lab 06/18/18 0441 06/18/18 1226 06/19/18 0316 06/20/18 0302 06/21/18 0427  WBC 4.4 5.5 7.0 9.9 14.7*  NEUTROABS 3.2  --   --   --   --   HGB 11.5* 10.6* 9.2* 9.3* 11.5*  HCT 34.3* 32.6* 27.3* 28.1* 34.3*  MCV 94.8 97.0 96.1 96.9 96.3  PLT 252 242 210 238 300   Cardiac Enzymes:   No results for input(s): CKTOTAL, CKMB, CKMBINDEX, TROPONINI in the last 168 hours. BNP (  last 3 results) No results for input(s): BNP in the last 8760 hours.  ProBNP (last 3 results) No results for input(s): PROBNP in the last 8760 hours.  CBG: Recent Labs  Lab 06/18/18 1712  GLUCAP 115*    Recent Results (from the past 240 hour(s))  Surgical PCR screen     Status: None   Collection Time: 06/18/18  1:47 AM  Result Value Ref Range Status   MRSA, PCR NEGATIVE NEGATIVE Final   Staphylococcus aureus NEGATIVE NEGATIVE Final    Comment: (NOTE) The Xpert SA Assay (FDA approved for NASAL specimens in patients 2 years of age and older), is one component of a comprehensive surveillance program. It is not intended to diagnose infection nor to guide or monitor treatment. Performed at [facility], [place] [address] [place], [place] [address]   Culture, blood (routine x 2)     Status: None (Preliminary result)   Collection Time: 06/19/18  7:54 PM  Result Value Ref Range Status    Specimen Description BLOOD LEFT ANTECUBITAL  Final   Special Requests   Final    BOTTLES DRAWN AEROBIC ONLY Blood Culture adequate volume Performed at [facility] [address] [place], [place] [address]    Culture   Final    NO GROWTH 2 DAYS Performed at [facility] [address] [place], [place] [address]    Report Status PENDING  Incomplete  Culture, blood (routine x 2)     Status: None (Preliminary result)   Collection Time: 06/19/18  7:54 PM  Result Value Ref Range Status   Specimen Description   Final    BLOOD LEFT ARM Performed at [facility] [address] [place], [place] [address]    Special Requests   Final    BOTTLES DRAWN AEROBIC ONLY Blood Culture adequate volume Performed at [facility] [address], [place], [place] [address]    Culture   Final    NO GROWTH 2 DAYS Performed at [facility] [address], [place], [place] [address]    Report Status PENDING  Incomplete     Studies: No results found.  Scheduled Meds: . amLODipine  10 mg Oral Daily  . budesonide (PULMICORT) nebulizer solution  0.5 mg Nebulization BID  . enoxaparin (LOVENOX) injection  30 mg Subcutaneous Q24H  . folic acid  1 mg Oral Daily  . guaiFENesin  1,200 mg Oral BID  . hydrocortisone-pramoxine   Rectal QID  . ipratropium-albuterol  3 mL Nebulization TID  . labetalol  10 mg Intravenous Q8H  . mouth rinse  15 mL Mouth Rinse BID  . metoprolol succinate  50 mg Oral Daily  . multivitamin with minerals  1 tablet Oral Daily  . mupirocin ointment  1 application Nasal BID  . nicotine  14 mg Transdermal Daily  . thiamine  100 mg Oral Daily   Or  . thiamine  100 mg Intravenous Daily    Continuous Infusions: . dextrose 5 % and 0.9 % NaCl with KCl 20 mEq/L    . famotidine (PEPCID) IV Stopped (06/20/18 1724)  . methocarbamol (ROBAXIN) IV 500 mg (06/16/18 2304)    Time spent: 44mins I have personally  reviewed and interpreted on  06/19/2018 daily labs,  imagings as discussed above under date review session and assessment and plans.  I reviewed all nursing notes, pharmacy notes, consultant notes,  vitals, pertinent old records  I have discussed plan  of care as described above with RN , patient  and family on 06/19/2018  Florencia Reasons MD, PhD  [facility] Pager [phone]. If 7PM-7AM, please contact night-coverage at [email or web address], password [facility] 06/21/2018, 12:07 PM  LOS: 7 days

## 2018-06-21 NOTE — Progress Notes (Signed)
   06/20/18 2045  Vitals  BP (!) 179/91   NT reassessed BP. Still elevated. RN administered Hydralazine as ordered.

## 2018-06-21 NOTE — Discharge Instructions (Signed)
[facility] _______[facility] ° °UMBILICAL OR INGUINAL HERNIA REPAIR: POST OP INSTRUCTIONS ° °Always review your discharge instruction sheet given to you by the facility where your surgery was performed. °IF YOU HAVE DISABILITY OR FAMILY LEAVE FORMS, YOU MUST BRING THEM TO THE OFFICE FOR PROCESSING.   °DO NOT GIVE THEM TO YOUR DOCTOR. ° °1. A  prescription for pain medication may be given to you upon discharge.  Take your pain medication as prescribed, if needed.  If narcotic pain medicine is not needed, then you may take acetaminophen (Tylenol) or ibuprofen (Advil) as needed. °2. Take your usually prescribed medications unless otherwise directed. °If you need a refill on your pain medication, please contact your pharmacy.  They will contact our office to request authorization. Prescriptions will not be filled after 5 pm or on week-ends. °3. You should follow a light diet the first 24 hours after arrival home, such as soup and crackers, etc.  Be sure to include lots of fluids daily.  Resume your normal diet the day after surgery. °4.Most patients will experience some swelling and bruising around the umbilicus or in the groin and scrotum.  Ice packs and reclining will help.  Swelling and bruising can take several days to resolve.  °6. It is common to experience some constipation if taking pain medication after surgery.  Increasing fluid intake and taking a stool softener (such as Colace) will usually help or prevent this problem from occurring.  A mild laxative (Milk of Magnesia or Miralax) should be taken according to package directions if there are no bowel movements after 48 hours. °7. Unless discharge instructions indicate otherwise, you may remove your bandages 24-48 hours after surgery, and you may shower at that time.  You may have steri-strips (small skin tapes) in place directly over the incision.  These strips should be left on the skin for 7-10 days.  If your surgeon used skin glue on the  incision, you may shower in 24 hours.  The glue will flake off over the next 2-3 weeks.  Any sutures or staples will be removed at the office during your follow-up visit. °8. ACTIVITIES:  You may resume regular (light) daily activities beginning the next day--such as daily self-care, walking, climbing stairs--gradually increasing activities as tolerated.  You may have sexual intercourse when it is comfortable.  Refrain from any heavy lifting or straining until approved by your doctor. ° °a.You may drive when you are no longer taking prescription pain medication, you can comfortably wear a seatbelt, and you can safely maneuver your car and apply brakes. °b.RETURN TO WORK:   °_____________________________________________ ° °9.You should see your doctor in the office for a follow-up appointment approximately 2-3 weeks after your surgery.  Make sure that you call for this appointment within a day or two after you arrive home to insure a convenient appointment time. °10.OTHER INSTRUCTIONS: _________________________ °   _____________________________________ ° °WHEN TO CALL YOUR DOCTOR: °1. Fever over 101.0 °2. Inability to urinate °3. Nausea and/or vomiting °4. Extreme swelling or bruising °5. Continued bleeding from incision. °6. Increased pain, redness, or drainage from the incision ° °The clinic staff is available to answer your questions during regular business hours.  Please don’t hesitate to call and ask to speak to one of the nurses for clinical concerns.  If you have a medical emergency, go to the nearest emergency room or call 911.  A surgeon from [facility] is always on call at the hospital ° ° °  [address], [address], [place], [place]  [address] ? ° [address] [address], [place], [place]   [address] °[phone] ? [phone] ? FAX [phone] °Web site: [email or web address] °

## 2018-06-21 NOTE — Progress Notes (Signed)
3 Days Post-Op    CC:  Subjective: He is very frail appearing, on oxygen, upper airway congestion.  He is moving air fairly well, but on I-S he only moves about 500.  He was smoking up until his admission.  He is tolerating clears well has had some bowel movements.  He has been seen by Dr. Hassell Done.  Objective: Vital signs in last 24 hours: Temp:  [97.4 F (36.3 C)-98 F (36.7 C)] 98 F (36.7 C) (01/13 0625) Pulse Rate:  [69-86] 81 (01/13 0625) Resp:  [9-31] 14 (01/13 0625) BP: (136-185)/(74-94) 144/78 (01/13 0625) SpO2:  [91 %-100 %] 96 % (01/13 0753) Last BM Date: 06/20/18 Nothing p.o. recorded 150 IV recorded Urine 1425 Stool X4 NA 132 Potassium 3.3, chloride 88, CO2 34, BUN 15, creatinine 0.68. BBC 14.7, hemoglobin 11.5, hematocrit 34.3 platelets normal at 300,000 Chest x-ray yesterday a.m.:Emphysema with improving aeration at the lung bases and tiny bilateral pleural effusions Intake/Output from previous day: 01/12 0701 - 01/13 0700 In: 150 [IV Piggyback:150] Out: 0867 [Urine:1425] Intake/Output this shift: No intake/output data recorded.  General appearance: alert, cooperative and He is short of breath sitting up and sounds terrible from a respiratory standpoint just sitting in the chair.  He is somewhat dyspneic just speaking.  His voice is also very soft and hoarse. Resp: Wheezing, upper respiratory congestion, dyspneic on oxygen supplement. GI: Hernia site sore but looks fine.  He is tolerating clear liquids and has had a bowel movement.  Lab Results:  Recent Labs    06/20/18 0302 06/21/18 0427  WBC 9.9 14.7*  HGB 9.3* 11.5*  HCT 28.1* 34.3*  PLT 238 300    BMET Recent Labs    06/20/18 0302 06/21/18 0427  NA 132* 132*  K 3.7 3.3*  CL 94* 88*  CO2 28 34*  GLUCOSE 107* 68*  BUN 19 15  CREATININE 0.72 0.68  CALCIUM 8.4* 8.8*   PT/INR No results for input(s): LABPROT, INR in the last 72 hours.  No results for input(s): AST, ALT, ALKPHOS, BILITOT,  PROT, ALBUMIN in the last 168 hours.   Lipase     Component Value Date/Time   LIPASE 53 (H) 06/13/2018 1924     Medications: . amLODipine  10 mg Oral Daily  . budesonide (PULMICORT) nebulizer solution  0.5 mg Nebulization BID  . enoxaparin (LOVENOX) injection  30 mg Subcutaneous Q24H  . folic acid  1 mg Oral Daily  . guaiFENesin  600 mg Oral BID  . hydrocortisone-pramoxine   Rectal QID  . ipratropium-albuterol  3 mL Nebulization TID  . labetalol  10 mg Intravenous Q8H  . mouth rinse  15 mL Mouth Rinse BID  . metoprolol succinate  50 mg Oral Daily  . multivitamin with minerals  1 tablet Oral Daily  . mupirocin ointment  1 application Nasal BID  . nicotine  14 mg Transdermal Daily  . OLANZapine  2.5 mg Oral Daily  . thiamine  100 mg Oral Daily   Or  . thiamine  100 mg Intravenous Daily   . dextrose 5 % and 0.45 % NaCl with KCl 20 mEq/L    . famotidine (PEPCID) IV Stopped (06/20/18 1724)  . methocarbamol (ROBAXIN) IV 500 mg (06/16/18 2304)   Anti-infectives (From admission, onward)   Start     Dose/Rate Route Frequency Ordered Stop   06/18/18 0600  ceFAZolin (ANCEF) IVPB 2g/100 mL premix  Status:  Discontinued     2 g 200 mL/hr over 30 Minutes  Intravenous On call to O.R. 06/18/18 0531 06/18/18 1211      Assessment/Plan Non-small cell lung cancer/history of tobacco use ongoing  -Tobacco use until admission/refusing nicotine patch/cold turkey/nicotine lozengers  from    home COPD with acute respiratory distress -postop Acute metabolic encephalopathy/postop delirium  -fall/ongoing confusion 11/12  -IV Lasix 1/10 & 1/11  -Dr. Erlinda Hong following - Medicine Hyponatremia  History of alcohol abuse Hypertension Peripheral artery disease/claudication Malnutrition/BMI 16.33 - prealbumin ordered  Incarcerated recurrent left inguinal hernia with small bowel obstruction Open inguinal hernia repair, 06/18/2018, Dr. Armandina Gemma POD #3  FEN: IV fluids stopped/clears =>> D5 half NS  +20KCL@ 100 cc/hr  -Advance to full liquids/regular diet  -Nutrition consult  -Pre-albumin pending ID: Preop Ancef DVT: Lovenox Follow-up with Dr. Harlow Asa  Plan: Pulmonary seeing him now and going to work on pulmonary issues.  I am going to advance his diet, nutrition consult, OT and PT.  Dr. Hassell Done has seen him and placed him on some IV fluids also.  LOS: 7 days    Laurelai Lepp 06/21/2018 [phone]

## 2018-06-21 NOTE — Progress Notes (Signed)
Nutrition Follow-up  DOCUMENTATION CODES:   Underweight, Severe malnutrition in context of chronic illness  INTERVENTION:    Ensure Enlive po BID, each supplement provides 350 kcal and 20 grams of protein  Magic cup TID with meals, each supplement provides 290 kcal and 9 grams of protein  NUTRITION DIAGNOSIS:   Severe Malnutrition related to chronic illness(COPD) as evidenced by energy intake < or equal to 75% for > or equal to 1 month, severe fat depletion, severe muscle depletion, percent weight loss.  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 Presents this admission with SBO due to hernia.    1/10- open inguinal hernia repair  1/12- clear liquid diet 1/13- full liquid diet  Per RN pt not tolerating liquids well due to difficulty breathing. States he can only manage a few bites at a time before becoming out of breath. He attempted to eat tomato soup this am and finished 50%. RN reports pt may do well with sipping on supplements. RD to order.   Weight noted to increase from 88 lb on 1/5 to 89 lb on 1/10. Would recommend checking daily weights to monitor trends.   Medications reviewed and include: folic acid, MVI with minerals, thiamine, NS with D5 and 20 mEq KCl @ 75 ml/hr Labs reviewed: Na 132 (L) K 3.3 (L) glucose 68-201  Diet Order:   Diet Order            Diet full liquid Room service appropriate? Yes; Fluid consistency: Thin  Diet effective now              EDUCATION NEEDS:   Education needs have been addressed  Skin:  Skin Assessment: Reviewed RN Assessment  Last BM:   06/21/2018  Height:   Ht Readings from Last 1 Encounters:  06/18/18 5\' 2"  (1.575 m)    Weight:   Wt Readings from Last 1 Encounters:  06/18/18 40.5 kg    Ideal Body Weight:  53.6 kg  BMI:  Body mass index is 16.33 kg/m.  Estimated Nutritional Needs:   Kcal:  1450-1650 kcal  Protein:  70-85 grams  Fluid:  >/= 1.4 L/day   Mariana Single RD, LDN Clinical Nutrition Pager # - [phone]

## 2018-06-21 NOTE — Consult Note (Signed)
REASON FOR CONSULT:    Left leg pain.  The consult is requested by Dr. Erlinda Hong.  HPI:   [name] is a pleasant 68 y.o. male, who was admitted 8 days ago with a left inguinal hernia and developed a bowel obstruction.  He ultimately underwent repair of an incarcerated left inguinal hernia for a small bowel obstruction on 06/18/2018.  I reviewed his previous records. The patient underwent angioplasty and stenting of the right external iliac artery by Dr.  Virgina Jock on 03/30/2018.  At that time there was a chronic occlusion of the left common iliac artery, external iliac artery and common femoral artery.  There is really no visualization of named vessels on the left except for possibly a hint of the deep femoral artery.  Reportedly the patient was then scheduled to be seen in our office by Dr. Sherren Mocha Early on 06/14/2018 but was hospitalized and therefore missed his appointment.  He presented with an incarcerated left inguinal hernia on 06/13/2017 and has been hospitalized since that time.  The patient has had a complicated postoperative course including issues with postoperative hypoxia, acute metabolic encephalopathy, and severe protein calorie malnutrition.  The patient also has a history of alcohol use with chronic pancreatitis.  On my history, the patient developed the gradual onset of claudication in both lower extremities in June 2019.  The symptoms involve his thighs and calves bilaterally.  Symptoms are brought on by ambulation and relieved with rest.  Symptoms were more significant on the left side.  They occurred a very short distance and he can barely walk around the grocery store.  He also describes some rest pain bilaterally but more significantly on the left side.  He underwent an arteriogram as described above on 03/30/2018 but there was nothing to do from an endovascular standpoint on the left and he was referred to our office.  The wife notes that he developed a small wound on the medial  aspect of his left first metatarsal head approximately 1-1/2 to 2 weeks ago.  His risk factors for peripheral vascular disease include heavy tobacco use and hypertension.  He denies any history of diabetes or family history of premature cardiovascular disease.  He had smoked up to 2-1/2 packs/day for many years and has been smoking for 50 years.  He is currently cut back to half a pack per day.  He does continue to drink and prior to this admission was drinking about 6 beers a day.  Past Medical History:  Diagnosis Date  . COPD (chronic obstructive pulmonary disease) ([place])   . Hypertension   . Iliac artery stenosis, right ([facility])    80% Stenosis  . 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[place])   . Lower limb ischemia 03/26/2018  . Lung cancer dx'd 12/2014  . Pancreatitis    x2 stents placed to make patent duct to pancreas  . Pulmonary hypertension ([place])   . Severe claudication ([place])   . Status post left foot surgery   . Tobacco abuse     Family History  Problem Relation Age of Onset  . COPD Mother   . CAD Mother   . Diabetes Father   . Cancer Brother        metastatic, unknown primary  . Cancer Paternal Grandmother     SOCIAL HISTORY: Social History   Socioeconomic History  . Marital status: Significant Other    Spouse name: Not on file  . Number of children: 3  . Years of education: 51  .  Highest education level: Not on file  Occupational History  . Occupation: Retired  Scientific laboratory technician  . Financial resource strain: Not on file  . Food insecurity:    Worry: Not on file    Inability: Not on file  . Transportation needs:    Medical: Not on file    Non-medical: Not on file  Tobacco Use  . Smoking status: Current Every Day Smoker    Packs/day: 0.50    Years: 50.00    Pack years: 25.00    Types: Cigarettes  . Smokeless tobacco: Never Used  . Tobacco comment: currently 0.5 ppd, trying to quit  Substance and Sexual Activity  . Alcohol use: Yes    Comment: 6 pack of beer per day  .  Drug use: No  . Sexual activity: Yes    Birth control/protection: Injection  Lifestyle  . Physical activity:    Days per week: Not on file    Minutes per session: Not on file  . Stress: Not on file  Relationships  . Social connections:    Talks on phone: Not on file    Gets together: Not on file    Attends religious service: Not on file    Active member of club or organization: Not on file    Attends meetings of clubs or organizations: Not on file    Relationship status: Not on file  . Intimate partner violence:    Fear of current or ex partner: Not on file    Emotionally abused: Not on file    Physically abused: Not on file    Forced sexual activity: Not on file  Other Topics Concern  . Not on file  Social History Narrative   Lives at home w/ a friend   Left-handed   Caffeine: 2 cups of coffee per day    Allergies  Allergen Reactions  . Chantix [Varenicline] Other (See Comments)    Physically ill  . Prevacid [Lansoprazole] Itching    "swell up with gas"    Current Facility-Administered Medications  Medication Dose Route Frequency Provider Last Rate Last Dose  . acetaminophen (TYLENOL) tablet 650 mg  650 mg Oral Q6H PRN Armandina Gemma, MD   650 mg at 06/20/18 1641   Or  . acetaminophen (TYLENOL) suppository 650 mg  650 mg Rectal Q6H PRN Armandina Gemma, MD      . albuterol (PROVENTIL) (2.5 MG/3ML) 0.083% nebulizer solution 2.5 mg  2.5 mg Nebulization Q2H PRN Cristal Generous, NP   2.5 mg at 06/20/18 1137  . amLODipine (NORVASC) tablet 10 mg  10 mg Oral Daily Florencia Reasons, MD   10 mg at 06/21/18 1021  . budesonide (PULMICORT) nebulizer solution 0.5 mg  0.5 mg Nebulization BID Mannam, Praveen, MD   0.5 mg at 06/21/18 0800  . dextromethorphan-guaiFENesin (MUCINEX DM) 30-600 MG per 12 hr tablet 1 tablet  1 tablet Oral BID PRN Florencia Reasons, MD      . dextrose 5 % and 0.9 % NaCl with KCl 20 mEq/L infusion   Intravenous Continuous Florencia Reasons, MD 75 mL/hr at 06/21/18 1212    . enoxaparin  (LOVENOX) injection 30 mg  30 mg Subcutaneous Q24H Armandina Gemma, MD   30 mg at 06/21/18 0738  . famotidine (PEPCID) tablet 20 mg  20 mg Oral QAC supper Polly Cobia, RPH      . folic acid (FOLVITE) tablet 1 mg  1 mg Oral Daily Armandina Gemma, MD   1 mg at 06/21/18  1021  . guaiFENesin (MUCINEX) 12 hr tablet 1,200 mg  1,200 mg Oral BID Ollis, Brandi L, NP      . hydrALAZINE (APRESOLINE) injection 5 mg  5 mg Intravenous Q6H PRN Armandina Gemma, MD   5 mg at 06/20/18 2045  . hydrocortisone-pramoxine (ANALPRAM-HC) 2.5-1 % rectal cream   Rectal QID Armandina Gemma, MD   1 application at 29/51/88 1330  . ipratropium-albuterol (DUONEB) 0.5-2.5 (3) MG/3ML nebulizer solution 3 mL  3 mL Nebulization TID Armandina Gemma, MD   3 mL at 06/21/18 1321  . labetalol (NORMODYNE,TRANDATE) injection 10 mg  10 mg Intravenous Margaretha Glassing, MD   10 mg at 06/21/18 1323  . magnesium sulfate IVPB 2 g 50 mL  2 g Intravenous Once Florencia Reasons, MD 50 mL/hr at 06/21/18 1327 2 g at 06/21/18 1327  . MEDLINE mouth rinse  15 mL Mouth Rinse BID Florencia Reasons, MD   15 mL at 06/21/18 1024  . menthol-cetylpyridinium (CEPACOL) lozenge 3 mg  1 lozenge Oral PRN Armandina Gemma, MD      . methocarbamol (ROBAXIN) 500 mg in dextrose 5 % 50 mL IVPB  500 mg Intravenous Q8H PRN Armandina Gemma, MD 100 mL/hr at 06/16/18 2304 500 mg at 06/16/18 2304  . metoprolol succinate (TOPROL-XL) 24 hr tablet 50 mg  50 mg Oral Daily Alphonsa Overall, MD   50 mg at 06/21/18 1021  . metoprolol tartrate (LOPRESSOR) injection 2.5 mg  2.5 mg Intravenous Q12H PRN Armandina Gemma, MD   2.5 mg at 06/20/18 0324  . multivitamin with minerals tablet 1 tablet  1 tablet Oral Daily Armandina Gemma, MD   1 tablet at 06/21/18 1021  . mupirocin ointment (BACTROBAN) 2 % 1 application  1 application Nasal BID Armandina Gemma, MD   1 application at 41/66/06 1024  . nicotine (NICODERM CQ - dosed in mg/24 hours) patch 14 mg  14 mg Transdermal Daily Armandina Gemma, MD   14 mg at 06/15/18 0957  . nicotine polacrilex  (COMMIT) lozenge 4 mg  4 mg Oral PRN Florencia Reasons, MD      . ondansetron (ZOFRAN-ODT) disintegrating tablet 4 mg  4 mg Oral Q6H PRN Armandina Gemma, MD       Or  . ondansetron (ZOFRAN) injection 4 mg  4 mg Intravenous Q6H PRN Armandina Gemma, MD      . phenol (CHLORASEPTIC) mouth spray 1 spray  1 spray Mouth/Throat PRN Armandina Gemma, MD   1 spray at 06/19/18 0606  . thiamine (VITAMIN B-1) tablet 100 mg  100 mg Oral Daily Armandina Gemma, MD   100 mg at 06/21/18 1021    REVIEW OF SYSTEMS:  [X]  denotes positive finding, [ ]  denotes negative finding Cardiac  Comments:  Chest pain or chest pressure:    Shortness of breath upon exertion: x   Short of breath when lying flat:    Irregular heart rhythm:        Vascular    Pain in calf, thigh, or hip brought on by ambulation: x   Pain in feet at night that wakes you up from your sleep:  x   Blood clot in your veins:    Leg swelling:         Pulmonary    Oxygen at home:    Productive cough:     Wheezing:  x       Neurologic    Sudden weakness in arms or legs:     Sudden numbness in arms or  legs:     Sudden onset of difficulty speaking or slurred speech:    Temporary loss of vision in one eye:     Problems with dizziness:         Gastrointestinal    Blood in stool:     Vomited blood:         Genitourinary    Burning when urinating:     Blood in urine:        Psychiatric    Major depression:         Hematologic    Bleeding problems:    Problems with blood clotting too easily:        Skin    Rashes or ulcers:        Constitutional    Fever or chills:     PHYSICAL EXAM:   Vitals:   06/21/18 0625 06/21/18 0753 06/21/18 1019 06/21/18 1323  BP: (!) 144/78  (!) 159/90 (!) 157/90  Pulse: 81  85 83  Resp: 14     Temp: 98 F (36.7 C)     TempSrc: Oral     SpO2: 94% 96%    Weight:      Height:       Body mass index is 16.33 kg/m.  GENERAL: The patient is a well-nourished male, in no acute distress. The vital signs are documented  above. CARDIAC: There is a regular rate and rhythm.  VASCULAR: I do not detect carotid bruits. On the left side I cannot palpate a femoral pulse popliteal or pedal pulses.  He does have a monophasic posterior tibial signal on the left with the Doppler and a barely audible dorsalis pedis signal on the left. On the right side he has a palpable femoral pulse although slightly diminished.  I cannot palpate a popliteal or pedal pulses.  He does have a monophasic dorsalis pedis and posterior tibial signal on the right. He has no significant lower extremity swelling. PULMONARY: There is good air exchange bilaterally without wheezing or rales. ABDOMEN: Soft and non-tender with normal pitched bowel sounds.  MUSCULOSKELETAL: There are no major deformities or cyanosis. NEUROLOGIC: No focal weakness or paresthesias are detected. SKIN: He has a small darkened area over his left first metatarsal head which measures about 5 mm in diameter.  There is no erythema or drainage associated with this. PSYCHIATRIC: The patient has a normal affect.  DATA:    ARTERIOGRAM: I did review the arteriogram that was done on 03/30/2018.  He had angioplasty and stenting of a right external iliac artery stenosis.  The stent extends to fairly close to the inguinal crease.  He has a superficial femoral artery occlusion below that.  There is very limited visualization on the left side and the only thing I can really see is the hint of a deep femoral artery on the left.  He does have diffuse calcific disease which is apparent.  CT ABDOMEN PELVIS: I reviewed his CT abdomen pelvis that was done on 06/13/2018.  This showed a small bowel obstruction secondary to an obstructed loop of small bowel within the left inguinal hernia.  He had extensive atherosclerotic disease noted.  LABS I reviewed his labs on 06/21/2018 which shows a creatinine of 0.68.  GFR is greater than 60.  White blood cell count 14.7.  Hemoglobin 11.5.  Platelets  300,000.  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  I have recommended that we obtain a CT angiogram to further assess this.  I have ordered this test.  We have also discussed importance of tobacco cessation.  He has a small wound on the medial aspect of the left first metatarsal.  I am not sure that he has adequate circulation to heal this although this is in the very early stages of the wound.  Regardless, he does not have any endovascular options for revascularization and would require both an inflow procedure (possibly a right to left femorofemoral bypass) and infrainguinal bypass.  Right to left femorofemoral bypass graft would be complicated by the fact that the stent in the right external iliac artery extends fairly low near the inguinal ligament.  In addition I am not sure that he has any good target vessels based on his arteriogram which shows really no named vessels.  This does show diffuse calcific disease.  Currently he is in no condition for major surgery given his pulmonary status, marked protein calorie malnutrition, and debilitated state.  If the wound progresses the safest approach would be a primary above-the-knee amputation.  I will follow-up on his arteriogram and communicate through the chart.  Once he gets out of the hospital I can follow him as an outpatient.  Hopefully he would be strong enough to consider our options at that point.  SEVERE PROTEIN CALORIE MALNUTRITION: Patient has a BMI of 16.  He would be at very high risk for wound healing problems with major surgery.   Deitra Mayo [facility] [phone]

## 2018-06-21 NOTE — Evaluation (Signed)
Physical Therapy Evaluation Patient Details Name: [name] MRN: [id number] DOB: 14-May-1951 Today's Date: 06/21/2018   History of Present Illness  Patient has poor pulmonary reserve due to advanced COPD, continued smoker, now s/p inguinal hernia repair, had increased confusion post op, at risk of decompensation, was transferred to stepdown unit on 1/10; pt now on medical floor, had a fall this morning reportedly trying to get to bathroom;  Clinical Impression  Pt admitted with above diagnosis. Pt currently with functional limitations due to the deficits listed below (see PT Problem List).  PT is significantly deconditioned, able to amb 20' and then became dizzy--see below for VS;  Will continue to follow in acute, pt will need HHPT  As they are not really amenable to SNF at  This time. As long as cognition continues to improve, should be able to go home with HHPT, [place] and family assist Recommend pt amb with staff in addition to PT  Pt will benefit from skilled PT to increase their independence and safety with mobility to allow discharge to the venue listed below.       Follow Up Recommendations Home health PT(may need 24* supervision initially, declines SNF)    Equipment Recommendations  Rolling walker with 5" wheels;3in1 (PT)    Recommendations for Other Services       Precautions / Restrictions Precautions Precautions: Fall Precaution Comments: O2,monitor sats Restrictions Weight Bearing Restrictions: No      Mobility  Bed Mobility Overal bed mobility: Needs Assistance Bed Mobility: Sit to Supine       Sit to supine: Supervision   General bed mobility comments: for safety  Transfers Overall transfer level: Needs assistance Equipment used: Rolling walker (2 wheeled) Transfers: Sit to/from Stand Sit to Stand: Min guard;Min assist         General transfer comment: cues for safety and hand placement  Ambulation/Gait Ambulation/Gait assistance: Min  assist Gait Distance (Feet): 20 Feet Assistive device: Rolling walker (2 wheeled) Gait Pattern/deviations: Step-through pattern;Narrow base of support;Trunk flexed;Decreased stride length     General Gait Details: cues for RW safety, c/o dizziness after ~ 10', VS checked after return to room (BP 168/98, HR 42-65, SpO2=90-92% on 4L, MD in room and aware  Stairs            Wheelchair Mobility    Modified Rankin (Stroke Patients Only)       Balance Overall balance assessment: Needs assistance;History of Falls Sitting-balance support: No upper extremity supported;Feet supported Sitting balance-Leahy Scale: Fair       Standing balance-Leahy Scale: Poor Standing balance comment: reliant on UEs                             Pertinent Vitals/Pain Pain Assessment: No/denies pain    Home Living Family/patient expects to be discharged to:: Private residence Living Arrangements: Spouse/significant other Available Help at Discharge: Family       Entrance Stairs-Number of Steps: 3 Home Layout: One level Home Equipment: None      Prior Function Level of Independence: Independent               Hand Dominance        Extremity/Trunk Assessment   Upper Extremity Assessment Upper Extremity Assessment: Generalized weakness    Lower Extremity Assessment Lower Extremity Assessment: Generalized weakness       Communication   Communication: No difficulties  Cognition Arousal/Alertness: Awake/alert Behavior During Therapy: WFL for tasks assessed/performed  Overall Cognitive Status: Impaired/Different from baseline Area of Impairment: Orientation;Following commands;Problem solving                 Orientation Level: Disoriented to;Time;Place     Following Commands: Follows one step commands consistently;Follows multi-step commands inconsistently     Problem Solving: Slow processing;Decreased initiation;Difficulty sequencing;Requires verbal  cues;Requires tactile cues General Comments: had posey belt on arrival, tele-sitter and family in room      General Comments      Exercises     Assessment/Plan    PT Assessment Patient needs continued PT services  PT Problem List Decreased strength;Decreased activity tolerance;Decreased balance;Decreased mobility;Decreased cognition;Decreased knowledge of use of DME;Decreased safety awareness       PT Treatment Interventions DME instruction;Gait training;Functional mobility training;Therapeutic activities;Therapeutic exercise;Balance training;Patient/family education    PT Goals (Current goals can be found in the Care Plan section)  Acute Rehab PT Goals Patient Stated Goal: to go home soon PT Goal Formulation: With patient/family Time For Goal Achievement: 07/05/18 Potential to Achieve Goals: Good    Frequency Min 3X/week   Barriers to discharge        Co-evaluation               AM-PAC PT "6 Clicks" Mobility  Outcome Measure Help needed turning from your back to your side while in a flat bed without using bedrails?: A Little Help needed moving from lying on your back to sitting on the side of a flat bed without using bedrails?: A Little Help needed moving to and from a bed to a chair (including a wheelchair)?: A Little Help needed standing up from a chair using your arms (e.g., wheelchair or bedside chair)?: A Little Help needed to walk in hospital room?: A Little Help needed climbing 3-5 steps with a railing? : A Lot 6 Click Score: 17    End of Session Equipment Utilized During Treatment: Gait belt Activity Tolerance: Patient limited by fatigue Patient left: in bed;with call bell/phone within reach;with bed alarm set;with family/visitor present   PT Visit Diagnosis: Unsteadiness on feet (R26.81);History of falling (Z91.81)    Time: 4403-4742 PT Time Calculation (min) (ACUTE ONLY): 27 min   Charges:   PT Evaluation $PT Eval Moderate Complexity: 1  Mod PT Treatments $Gait Training: 8-22 mins        Kenyon Ana, PT  Pager: [phone] Acute Rehab Dept [facility] ([facility])): [phone]   06/21/2018   [facility] 06/21/2018, 1:33 PM

## 2018-06-21 NOTE — Progress Notes (Addendum)
NAME:  [name], MRN:  [id number], DOB:  [birth year], LOS: 7 ADMISSION DATE:  06/13/2018, CONSULTATION DATE:  06/18/18 REFERRING MD:  Ernst Spell CHIEF COMPLAINT:  Hypoxia, agitation   Brief History   68 yo M s/p inguinal hernia repair 1/10, after presenting 1/5 with abdominal pain, bilateral inguinal hernias with small bowel obstruction. Underwent hernia repair 1/10. Post operatively patient is agitated and had SpO2 high 80s to low 90s; PCCM asked to consult for hypoxia.  Past Medical History  COPD (FEV1 49% in 2016)  HTN PVD NSCLC EtOH abuse Recurrent inguinal hernia  Significant Hospital Events   1/5 Admission 1/10 L inguinal hernia repair. Transfer to SDU for hypoxia post-operative 1/11 mild sedation from ativan, on 5L   Consults:  Surgery PCCM  Procedures:  1/10 L inguinal hernia repair  Significant Diagnostic Tests:  1/5 CT a/p >> bilatera hiatal hernia, distal small bowel obstruction. Transition point located within L inguinal hernia. R hiatal hernia containing ascites  1/10 CXR >> RLL opacity  Micro Data:  1/10 MRSA >> neg  Antimicrobials:    Interim history/subjective:  Pt reports cough with thick secretions, difficult to cough up.  Remains on 4L O2 (not on O2 at baseline)  Objective   Blood pressure (!) 144/78, pulse 81, temperature 98 F (36.7 C), temperature source Oral, resp. rate 14, height 5\' 2"  (1.575 m), weight 40.5 kg, SpO2 96 %.        Intake/Output Summary (Last 24 hours) at 06/21/2018 1013 Last data filed at 06/21/2018 0944 Gross per 24 hour  Intake 150 ml  Output 1225 ml  Net -1075 ml   Filed Weights   06/13/18 1849 06/18/18 0924 06/18/18 1437  Weight: 39.9 kg 38.1 kg 40.5 kg   Examination: General: cachectic male sitting in chair in NAD  HEENT: MM pink/dry Neuro: Awake, alert, MAE CV: s1s2 rrr, no m/r/g PULM: prolonged exp phase, [place] O2, lungs bilaterally with crackles YI:AXKP, non-tender, bsx4 active, surgical incision LLQ  clean/dry Extremities: warm/dry, no edema  Skin: no rashes or lesions  Resolved Hospital Problem list   SBO   Assessment & Plan:   COPD Postop hypoxia, likely secondary to atelectasis, chest congestion with poor mucociliary clearance Does not appear to be in active COPD exacerbation P: Wean O2 for sats 88-94% Pulmonary hygiene-IS, mobilize, flutter  Add iPPB with nebs  Continue mucinex BID  Pulmicort + Duoneb Smoking cessation counseling  Assess ambulatory O2 needs prior to discharge if not weaned off O2 Follow up as outpatient for COPD evaluation at discharge   Bilateral, R>L Pleural Effusions  -improved with diuresis  P: Gentle IVF's per primary  Follow up CXR in am   Post-Operative Confusion - resolved In setting of CNS depressing medications, COPD P: Minimize sedating medications  OOB / mobilize  HTN P: Per primary   Rest per primary    Best practice:  Diet: NPO, sips with meds and ice chips  Pain/Anxiety/Delirium protocol (if indicated): tylenol, dilaudid VAP protocol (if indicated): n/a DVT prophylaxis: lovenox GI prophylaxis: pepcid Glucose control: n/a Mobility: OOB Code Status: full Family Communication: No family at bedside.  Patient updated on plan of care.  Disposition: Medical floor, TRH  Labs   CBC: Recent Labs  Lab 06/18/18 0441 06/18/18 1226 06/19/18 0316 06/20/18 0302 06/21/18 0427  WBC 4.4 5.5 7.0 9.9 14.7*  NEUTROABS 3.2  --   --   --   --   HGB 11.5* 10.6* 9.2* 9.3* 11.5*  HCT 34.3*  32.6* 27.3* 28.1* 34.3*  MCV 94.8 97.0 96.1 96.9 96.3  PLT 252 242 210 238 101    Basic Metabolic Panel: Recent Labs  Lab 06/17/18 0827 06/18/18 0441 06/18/18 1226 06/19/18 0316 06/20/18 0302 06/21/18 0427  NA 126* 132*  --  132* 132* 132*  K 4.6 5.1  --  3.9 3.7 3.3*  CL 92* 94*  --  97* 94* 88*  CO2 27 27  --  26 28 34*  GLUCOSE 160* 95  --  201* 107* 68*  BUN 27* 31*  --  27* 19 15  CREATININE 1.07 1.08 1.12 0.88 0.72 0.68    CALCIUM 8.7* 8.9  --  7.9* 8.4* 8.8*  MG  --  1.8  --   --  1.3* 1.8   GFR: Estimated Creatinine Clearance: 52 mL/min (by C-G formula based on SCr of 0.68 mg/dL). Recent Labs  Lab 06/18/18 1226 06/19/18 0316 06/19/18 1954 06/20/18 0302 06/21/18 0427  WBC 5.5 7.0  --  9.9 14.7*  LATICACIDVEN  --   --  1.5  --   --     Liver Function Tests: No results for input(s): AST, ALT, ALKPHOS, BILITOT, PROT, ALBUMIN in the last 168 hours. No results for input(s): LIPASE, AMYLASE in the last 168 hours. No results for input(s): AMMONIA in the last 168 hours.  ABG    Component Value Date/Time   PHART 7.444 06/19/2018 1844   PCO2ART 45.3 06/19/2018 1844   PO2ART 74.5 (L) 06/19/2018 1844   HCO3 30.5 (H) 06/19/2018 1844   O2SAT 94.5 06/19/2018 1844     Coagulation Profile: No results for input(s): INR, PROTIME in the last 168 hours.  Cardiac Enzymes: No results for input(s): CKTOTAL, CKMB, CKMBINDEX, TROPONINI in the last 168 hours.  HbA1C: No results found for: HGBA1C  CBG: Recent Labs  Lab 06/18/18 [place], NP-C [facility] Pgr: [phone] or if no answer [phone] 06/21/2018, 10:13 AM

## 2018-06-22 ENCOUNTER — Inpatient Hospital Stay: Payer: Self-pay

## 2018-06-22 LAB — COMPREHENSIVE METABOLIC PANEL
ALK PHOS: 57 U/L (ref 38–126)
ALT: 20 U/L (ref 0–44)
AST: 32 U/L (ref 15–41)
Albumin: 3 g/dL — ABNORMAL LOW (ref 3.5–5.0)
Anion gap: 9 (ref 5–15)
BUN: 10 mg/dL (ref 8–23)
CO2: 35 mmol/L — ABNORMAL HIGH (ref 22–32)
Calcium: 8.6 mg/dL — ABNORMAL LOW (ref 8.9–10.3)
Chloride: 92 mmol/L — ABNORMAL LOW (ref 98–111)
Creatinine, Ser: 0.62 mg/dL (ref 0.61–1.24)
GFR calc Af Amer: >60 mL/min (ref >60)
GFR calc non Af Amer: >60 mL/min (ref >60)
Glucose, Bld: 164 mg/dL — ABNORMAL HIGH (ref 70–99)
Potassium: 3.3 mmol/L — ABNORMAL LOW (ref 3.5–5.1)
Sodium: 136 mmol/L (ref 135–145)
Total Bilirubin: 0.8 mg/dL (ref 0.3–1.2)
Total Protein: 6.1 g/dL — ABNORMAL LOW (ref 6.5–8.1)

## 2018-06-22 LAB — CBC
HCT: 35.1 % — ABNORMAL LOW (ref 39.0–52.0)
Hemoglobin: 11.5 g/dL — ABNORMAL LOW (ref 13.0–17.0)
MCH: 31.1 pg (ref 26.0–34.0)
MCHC: 32.8 g/dL (ref 30.0–36.0)
MCV: 94.9 fL (ref 80.0–100.0)
Platelets: 349 10*3/uL (ref 150–400)
RBC: 3.7 MIL/uL — AB (ref 4.22–5.81)
RDW: 14.3 % (ref 11.5–15.5)
WBC: 15.5 10*3/uL — ABNORMAL HIGH (ref 4.0–10.5)
nRBC: 0 % (ref 0.0–0.2)

## 2018-06-22 LAB — PHOSPHORUS: PHOSPHORUS: 2.2 mg/dL — AB (ref 2.5–4.6)

## 2018-06-22 LAB — EXPECTORATED SPUTUM ASSESSMENT W REFEX TO RESP CULTURE

## 2018-06-22 LAB — GLUCOSE, CAPILLARY: Glucose-Capillary: 211 mg/dL — ABNORMAL HIGH (ref 70–99)

## 2018-06-22 LAB — TSH: TSH: 3.982 u[IU]/mL (ref 0.350–4.500)

## 2018-06-22 LAB — EXPECTORATED SPUTUM ASSESSMENT W GRAM STAIN, RFLX TO RESP C

## 2018-06-22 LAB — MAGNESIUM: Magnesium: 1.6 mg/dL — ABNORMAL LOW (ref 1.7–2.4)

## 2018-06-22 LAB — PREALBUMIN: Prealbumin: 11.3 mg/dL — ABNORMAL LOW (ref 18–38)

## 2018-06-22 MED ORDER — POTASSIUM CHLORIDE CRYS ER 20 MEQ PO TBCR
40.0000 meq | EXTENDED_RELEASE_TABLET | ORAL | Status: AC
  Administered 2018-06-22 (×2): 40 meq via ORAL
  Filled 2018-06-22 (×2): qty 2

## 2018-06-22 MED ORDER — FUROSEMIDE 10 MG/ML IJ SOLN
40.0000 mg | Freq: Once | INTRAMUSCULAR | Status: AC
  Administered 2018-06-22: 40 mg via INTRAVENOUS
  Filled 2018-06-22: qty 4

## 2018-06-22 MED ORDER — FOLIC ACID 5 MG/ML IJ SOLN
1.0000 mg | Freq: Every day | INTRAMUSCULAR | Status: DC
  Administered 2018-06-23 – 2018-06-25 (×3): 1 mg via INTRAVENOUS
  Filled 2018-06-22 (×4): qty 0.2

## 2018-06-22 MED ORDER — METOPROLOL TARTRATE 5 MG/5ML IV SOLN
2.5000 mg | Freq: Three times a day (TID) | INTRAVENOUS | Status: DC
  Administered 2018-06-22 – 2018-06-27 (×14): 2.5 mg via INTRAVENOUS
  Filled 2018-06-22 (×14): qty 5

## 2018-06-22 MED ORDER — K PHOS MONO-SOD PHOS DI & MONO 155-852-130 MG PO TABS
250.0000 mg | ORAL_TABLET | Freq: Two times a day (BID) | ORAL | Status: DC
  Administered 2018-06-22: 250 mg via ORAL
  Filled 2018-06-22: qty 1

## 2018-06-22 MED ORDER — MAGNESIUM SULFATE 4 GM/100ML IV SOLN
4.0000 g | Freq: Once | INTRAVENOUS | Status: AC
  Administered 2018-06-22: 4 g via INTRAVENOUS
  Filled 2018-06-22: qty 100

## 2018-06-22 MED ORDER — SODIUM CHLORIDE 0.9 % IV SOLN
1.0000 g | INTRAVENOUS | Status: AC
  Administered 2018-06-22 – 2018-06-26 (×5): 1 g via INTRAVENOUS
  Filled 2018-06-22 (×5): qty 1

## 2018-06-22 MED ORDER — ACETYLCYSTEINE 20 % IN SOLN
4.0000 mL | Freq: Three times a day (TID) | RESPIRATORY_TRACT | Status: AC
  Administered 2018-06-22 (×2): 4 mL via RESPIRATORY_TRACT
  Filled 2018-06-22 (×3): qty 4

## 2018-06-22 MED ORDER — IPRATROPIUM-ALBUTEROL 0.5-2.5 (3) MG/3ML IN SOLN
3.0000 mL | Freq: Once | RESPIRATORY_TRACT | Status: AC
  Administered 2018-06-22: 3 mL via RESPIRATORY_TRACT
  Filled 2018-06-22: qty 3

## 2018-06-22 MED ORDER — THIAMINE HCL 100 MG/ML IJ SOLN
100.0000 mg | Freq: Every day | INTRAMUSCULAR | Status: DC
  Administered 2018-06-23 – 2018-06-25 (×3): 100 mg via INTRAVENOUS
  Filled 2018-06-22 (×3): qty 2

## 2018-06-22 MED ORDER — CLOPIDOGREL BISULFATE 75 MG PO TABS
75.0000 mg | ORAL_TABLET | Freq: Every day | ORAL | Status: DC
  Administered 2018-06-22: 75 mg via ORAL
  Filled 2018-06-22: qty 1

## 2018-06-22 MED ORDER — METHYLPREDNISOLONE SODIUM SUCC 125 MG IJ SOLR
60.0000 mg | INTRAMUSCULAR | Status: DC
  Administered 2018-06-22: 60 mg via INTRAVENOUS
  Filled 2018-06-22: qty 2

## 2018-06-22 MED ORDER — TRAVASOL 10 % IV SOLN
INTRAVENOUS | Status: AC
  Administered 2018-06-22: 17:00:00 via INTRAVENOUS
  Filled 2018-06-22: qty 480

## 2018-06-22 MED ORDER — CARVEDILOL 12.5 MG PO TABS
12.5000 mg | ORAL_TABLET | Freq: Two times a day (BID) | ORAL | Status: DC
  Administered 2018-06-22: 12.5 mg via ORAL
  Filled 2018-06-22: qty 1

## 2018-06-22 MED ORDER — IPRATROPIUM-ALBUTEROL 0.5-2.5 (3) MG/3ML IN SOLN
3.0000 mL | Freq: Four times a day (QID) | RESPIRATORY_TRACT | Status: DC
  Administered 2018-06-22 – 2018-06-24 (×10): 3 mL via RESPIRATORY_TRACT
  Filled 2018-06-22 (×10): qty 3

## 2018-06-22 MED ORDER — INSULIN ASPART 100 UNIT/ML ~~LOC~~ SOLN
0.0000 [IU] | Freq: Four times a day (QID) | SUBCUTANEOUS | Status: DC
  Administered 2018-06-22: 3 [IU] via SUBCUTANEOUS
  Administered 2018-06-23: 2 [IU] via SUBCUTANEOUS

## 2018-06-22 MED ORDER — SODIUM CHLORIDE 0.9 % IV SOLN
500.0000 mg | INTRAVENOUS | Status: AC
  Administered 2018-06-22 – 2018-06-26 (×5): 500 mg via INTRAVENOUS
  Filled 2018-06-22 (×5): qty 500

## 2018-06-22 MED ORDER — SODIUM CHLORIDE 0.9% FLUSH
10.0000 mL | INTRAVENOUS | Status: DC | PRN
  Administered 2018-06-23: 20 mL
  Administered 2018-06-29: 10 mL
  Filled 2018-06-22 (×2): qty 40

## 2018-06-22 NOTE — Progress Notes (Addendum)
Hospitalist consult PROGRESS NOTE  [name] XIP:[id number] DOB: 04-10-1951 DOA: 06/13/2018 PCP: [name], MD  HPI/Recap of past 24 hours:  Very weak, wheezing, less confusion, oriented to year/month, know he is in the hospital, states he is" [facility]"  No fever, he reports passing gas, no bm this am, no n/v, denies ab pain  Assessment/Plan: Principal Problem:   Incarcerated left inguinal hernia s/p repair 06/18/2018 Active Problems:   Smoker   COPD (chronic obstructive pulmonary disease) ([place])   Essential hypertension   Protein-calorie malnutrition, severe ([facility])   Severe claudication ([place])   PVD (peripheral vascular disease) ([place])   Acute hypoxemic respiratory failure ([place])   Acute metabolic encephalopathy   SBO (small bowel obstruction) ([place])   Inguinal hernia of left side with obstruction and without gangrene   SBO 2/2 left inguinal hernia --On general surgery service, Plavix held for 5 days, he is brought to the OR  On 1/10 and underwent OPEN RECURRENT LEFT INGUINAL HERNIA REPAIR WITH MESH (Left), INSERTION OF MESH (Left) -general surgery oked to Resume plavix  -he is started on TPN on 1/14 due to persistent ileus, hold all oral meds --plan per general surgery  Postop hypoxia  -Patient has poor pulmonary reserve due to advanced COPD, continued smoker,  status post abdomen surgery, with increased confusion, at risk of decompensation, he is transferred to stepdown unit on 1/10 -stat chest x-ray obtained on 1/10 showed "Small new bilateral pleural effusions with probable atelectasis at the left base" he received lasix 20mg  iv x1 on 1/10 and 20mg  iv x1 on 1/11 with good diruesis, he received another dose of lasix 40mg  on 1/14. Will likely need lasix prn while on TPN. -critical care consulted, input appreciated --persistent hypoxia, girlfriends reports this is new,  CTA chest no PE, does show mucus plugging and infiltrates, he start to have diffuse wheezing on 1/14, will  treat as COPD exacerbation, start steroids, increase nebs, chest PT, start rocephin/zithro , plan for 5 days abx treatment.   Acute metabolic encephalopathy/delirium on 1/10: Likely multifactorial , alcohol withdrawal? Hyponatremia, surgery with anesthesia, inhospital delirium -per family patient has h/o confusion in the past during hospitalization -ct head no acute findings, lactic acid wnl, blood culture no growth - avoid dilaudid ,ativan , d/c tele -low dose zyprexa started on 1/12, girlfriend states it made him more agitated request zyprexa to be stopped. -need safety sitter at bedside for fall precaution , less confused   H/o internal hemorrhoids -Seen by Dr. Thana Farr in April 2019, s/p rubber band ligation  -Blood per rectum this morning, denies abdominal pain -Defer to general surgery  Hyponatremia: Sodium nadir at 126, improving at 136 today on 1/14 encourage oral intake   Hypokalemia: remain low , continue to replace , keep k>4  Hypomagnesemia:  continue to replace mag>2  COPD/heavy smoker -Not on home O2 -Likely will benefit O2 at discharge ( per RN prior to surgery patient o2 dropped to the 70's on room air  while Ambulating with one person  Assist to the bathroom) -need outpatient follow up for COPD  Left upper lobe lung cancer  Lung nodules x2 left upper lobe and 1 left lower lobe s/p XRT -Case discussed with radiation oncology Dr. Tammi Klippel who stated patient has stage Ia status post curative intent treatment XRT finished in July 2019 -Per Dr. Tammi Klippel patient missed his restaging scan, Dr. Tammi Klippel recommended CT chest with IV contrast prior to discharge which is done on 1/13 -pa and follow with  him every 26-month.   PAD s/p stent -PAD s/p Successful self expanding Eluvia 7.0 X 60 mm stent placement Rt external iliac artery on 03/30/2018 by cardiology Dr Virgina Jock,  -Has an appointment with vascular surgery Dr Donnetta Hutching on 2/11 -Girl friend reports patient has an  appointment with Dr Donnetta Hutching on 1/6 however patient was admitted to the hospital Girl friend reports patient has increase pain on left leg while walking, she request vascular being consulted while patient is in the hospital, will contact vascular surgery. -vascular surgery Dr Scot Dock input appreciated, patient underwent CT angiogram, per Dr Scot Dock think patient likely will need bypass surgery, but do not think he could tolerate major vascular surgery given his respiratory status, severely debilitated state, and severe protein calorie malnutrition., recommend outpatient follow up with him.  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 Nutrition consult   Code Status: full  Family Communication: patient   Disposition Plan: not ready to discharge   Consultants:  General surgery as primary  hospitalist as consultant  Cardiology as consultant  Pulmonary/critical care  Procedures:  Ng placement and removal  on 1/10 s/p OPEN RECURRENT LEFT INGUINAL HERNIA REPAIR WITH MESH (Left), INSERTION OF MESH (Left) TPN   Antibiotics:  Perioperative  Rocephin/zithro from 1/14 , plan for 5 days   Objective: BP (!) 169/88 (BP Location: Right Arm)   Pulse 73   Temp 97.6 F (36.4 C) (Oral)   Resp 16   Ht 5\' 2"  (1.575 m)   Wt 40.5 kg   SpO2 91%   BMI 16.33 kg/m   Intake/Output Summary (Last 24 hours) at 06/22/2018 0802 Last data filed at 06/22/2018 0600 Gross per 24 hour  Intake 1960.11 ml  Output 1375 ml  Net 585.11 ml   Filed Weights   06/13/18 1849 06/18/18 0924 06/18/18 1437  Weight: 39.9 kg 38.1 kg 40.5 kg    Exam: Patient is examined daily including today on 06/22/2018, exams remain  the same as of yesterday except that has changed    General:  Thin, frail, chronically ill appearing, very weak, less confused  Cardiovascular: RRR  Respiratory: diminished at basis, bilateral diffuse wheezing  Abdomen: post op changes, nontender, + bowel sounds  Musculoskeletal: No Edema  Neuro: less confused  Data Reviewed: Basic Metabolic Panel: Recent Labs  Lab 06/18/18 0441 06/18/18 1226 06/19/18 0316 06/20/18 0302 06/21/18 0427 06/22/18 0520  NA 132*  --  132* 132* 132* 136  K 5.1  --  3.9 3.7 3.3* 3.3*  CL 94*  --  97* 94* 88* 92*  CO2 27  --  26 28 34* 35*  GLUCOSE 95  --  201* 107* 68* 164*  BUN 31*  --  27* 19 15 10   CREATININE 1.08 1.12 0.88 0.72 0.68 0.62  CALCIUM 8.9  --  7.9* 8.4* 8.8* 8.6*  MG 1.8  --   --  1.3* 1.8 1.6*   Liver Function Tests: Recent Labs  Lab 06/22/18 0520  AST 32  ALT 20  ALKPHOS 57  BILITOT 0.8  PROT 6.1*  ALBUMIN 3.0*   No results for input(s): LIPASE, AMYLASE in the last 168 hours. No results for input(s): AMMONIA in the last 168 hours. CBC: Recent Labs  Lab 06/18/18 0441 06/18/18 1226 06/19/18 0316 06/20/18 0302 06/21/18 0427 06/22/18 0520  WBC 4.4 5.5 7.0 9.9 14.7* 15.5*  NEUTROABS 3.2  --   --   --   --   --   HGB 11.5* 10.6* 9.2* 9.3* 11.5* 11.5*  HCT 34.3* 32.6* 27.3* 28.1* 34.3* 35.1*  MCV 94.8 97.0 96.1 96.9 96.3 94.9  PLT 252 242 210 238 300 349   Cardiac Enzymes:   No results for input(s): CKTOTAL, CKMB, CKMBINDEX, TROPONINI in the last 168 hours. BNP (last 3 results) No results for input(s): BNP in the last 8760 hours.  ProBNP (last 3 results) No results for input(s): PROBNP in the last 8760 hours.  CBG: Recent Labs  Lab 06/18/18 1712  GLUCAP 115*    Recent Results (from the past 240 hour(s))  Surgical PCR screen     Status: None   Collection Time: 06/18/18  1:47 AM  Result Value Ref Range Status   MRSA, PCR NEGATIVE NEGATIVE Final   Staphylococcus aureus NEGATIVE NEGATIVE Final     Comment: (NOTE) The Xpert SA Assay (FDA approved for NASAL specimens in patients 66 years of age and older), is one component of a comprehensive surveillance program. It is not intended to diagnose infection nor to guide or monitor treatment. Performed at [facility], [place] [address], [place], [place] [address]   Culture, blood (routine x 2)     Status: None (Preliminary result)   Collection Time: 06/19/18  7:54 PM  Result Value Ref Range Status   Specimen Description BLOOD LEFT ANTECUBITAL  Final   Special Requests   Final    BOTTLES DRAWN AEROBIC ONLY Blood Culture adequate volume Performed at [facility] [address], [place], [place] [address]    Culture   Final    NO GROWTH 2 DAYS Performed at [facility] [address], [place], [place] [address]    Report Status PENDING  Incomplete  Culture, blood (routine x 2)     Status: None (Preliminary result)   Collection Time: 06/19/18  7:54 PM  Result Value Ref Range Status   Specimen Description   Final    BLOOD LEFT ARM Performed at [facility] [address] [place], [place] [address]    Special Requests   Final    BOTTLES DRAWN AEROBIC ONLY Blood Culture adequate volume Performed at [facility] [address], [place], [place] [address]    Culture   Final    NO GROWTH 2 DAYS Performed at [facility] [address], [place], [place] [address]    Report Status PENDING  Incomplete     Studies: Ct Angio Chest Pe W Or Wo Contrast  Addendum Date: 06/21/2018   ADDENDUM REPORT: 06/21/2018 23:33 ADDENDUM: The findings of persistent high-grade small bowel obstruction, lung findings, and negative PE were discussed by telephone with Dr. Armandina Gemma on 06/21/2018 at 2328 hours. Electronically Signed   By: Genevie Ann M.D.   On: 06/21/2018 23:33   Result Date: 06/21/2018 CLINICAL DATA:  68 year old male status post incarcerated left  inguinal hernia related bowel obstruction treated surgically on 06/18/2018. Postoperative hypoxia. 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 Developed a skin wound over the medial aspect of the left 1st metatarsal about 2 weeks ago. Non-small cell lung cancer status post SBRT to the left lung. EXAM: CT ANGIOGRAPHY CHEST WITH CONTRAST TECHNIQUE: Multidetector  CT imaging of the chest was performed using the standard protocol during bolus administration of intravenous contrast. Multiplanar CT image reconstructions and MIPs were obtained to evaluate the vascular anatomy. CONTRAST:  19mL ISOVUE-370 IOPAMIDOL (ISOVUE-370) INJECTION 76% COMPARISON:  PET-CT 10/19/2017 and earlier. FINDINGS: Cardiovascular: Good contrast bolus timing in the pulmonary arterial tree and the thoracic aorta. No focal filling defect identified in the pulmonary arteries to suggest acute pulmonary embolism. Calcified and soft thoracic aortic atherosclerosis. No thoracic aortic aneurysm or dissection. Calcified coronary artery atherosclerosis. Cardiac size at the upper limits of normal. No pericardial effusion. Mediastinum/Nodes: No mediastinal lymphadenopathy. Lungs/Pleura: Small to moderate layering bilateral pleural effusions, slightly greater on the right. Superimposed enhancing bilateral middle lobe and lower lobe atelectasis. Underlying centrilobular emphysema. Focal peribronchial opacity in the right upper lobe on series 6, image 41 is new since 2019 and appears inflammatory. Similar patchy opacity in the right lower lobe superior segment. Similar patchy inflammatory appearing opacity in the bilateral lower lobes proximal to the basilar segment atelectasis (series 6, image 93 on the right). Chronic architectural distortion along the left lateral major fissure has not significantly changed. Mild apical septal thickening. Stable to smaller lateral right upper lobe lung nodule on series  6, image 25. There are layering secretions in the trachea and right mainstem bronchus. Upper Abdomen: Abdominal viscera are described on the CTA abdomen and pelvis runoff today reported separately. Musculoskeletal: A subacute appearing left posterolateral 7th rib fracture is new since May 2019 with some periosteal new bone present. No superimposed acute or suspicious osseous abnormality in the chest. Review of the MIP images confirms the above findings. IMPRESSION: 1. No evidence of acute pulmonary embolus. 2. Thoracic aortic atherosclerosis with no associated dissection or aortic aneurysm in the chest. CTA Abdomen/Pelvis Runoff today is reported separately. 3. Emphysema (ICD10-J43.9) with scattered bilateral bronchopneumonia and small to moderate bilateral layering pleural effusions with atelectasis. Electronically Signed: By: Genevie Ann M.D. On: 06/21/2018 22:16   Ct Angio Ao+bifem W & Or Wo Contrast  Addendum Date: 06/21/2018   ADDENDUM REPORT: 06/21/2018 23:33 ADDENDUM: The findings of persistent high-grade small bowel obstruction, lung findings, and negative PE were discussed by telephone with Dr. Armandina Gemma on 06/21/2018 at 2328 hours. Electronically Signed   By: Genevie Ann M.D.   On: 06/21/2018 23:33   Result Date: 06/21/2018 CLINICAL DATA:  68 year old male status post incarcerated left inguinal hernia related bowel obstruction treated surgically on 06/18/2018. Postoperative hypoxia. 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 Developed a skin wound over the medial aspect of the left 1st metatarsal about 2 weeks ago. Non-small cell lung cancer status post SBRT to the left lung. EXAM: CT ANGIOGRAPHY OF ABDOMINAL AORTA WITH ILIOFEMORAL RUNOFF TECHNIQUE: Multidetector CT imaging of the abdomen, pelvis and lower extremities was performed using the standard protocol during bolus administration of intravenous contrast. Multiplanar CT image reconstructions and  MIPs were obtained to evaluate the vascular anatomy. CONTRAST:  151mL ISOVUE-370 IOPAMIDOL (ISOVUE-370) INJECTION 76% COMPARISON:  Chest CTA today reported separately. Preoperative CT Abdomen and Pelvis 06/13/2018. FINDINGS: VASCULAR Aorta: Extensive aortic atherosclerosis. No aortic dissection or aneurysm. Celiac: Patent with no hemodynamically significant stenosis. SMA: Patent with moderate proximal stenosis as seen on series 4, image 101. Renals: Patent with calcified plaque greater on the right. Moderate to severe proximal right main renal artery stenosis (series 4, image 106). IMA: Patent with atherosclerosis at its origin. RIGHT Lower Extremity The right iliac arteries are atherosclerotic  but patent. There is a patent right external iliac artery stent with no in stent stenosis identified. There is moderate stenosis at the origin of the right internal iliac artery on series 4, image 142. The right common femoral artery is patent with soft and calcified plaque. The right SFA is occluded at its origin and only reconstituted distally just proximal to the popliteal on series 4, image 247. The right PFA remains patent. The popliteal is patent with soft and calcified plaque. Runoff: Attenuated 3 vessel runoff. LEFT Lower Extremity Occluded left common and external iliac arteries. Occluded proximal left internal iliac artery which is reconstituted distally. Reconstituted left common femoral artery in part from the left inferior epigastric. Patent left femoral artery bifurcation with occluded left SFA about 5 centimeters distal to the bifurcation, reconstituted distally at the popliteal. The left profundus femoral artery remains patent. The left popliteal artery is patent with soft and calcified plaque. Runoff: Attenuated 3 vessel runoff, worse when compared to the right side. See series 4, image 375. Review of the MIP images confirms the above findings. NON-VASCULAR Lower chest: Reported separately today.  Hepatobiliary: Negative liver and gallbladder. Pancreas: Chronic calcific pancreatitis is stable. Spleen: Diminutive, negative. Adrenals/Urinary Tract: Adrenal glands and kidneys are stable and within normal limits. Unremarkable urinary bladder. Stomach/Bowel: Interval repair of the left inguinal hernia, however, a high-grade small bowel obstruction persists with transition point in the left lower quadrant as seen on series 7, image 46 and series 4, image 147. Small bowel loops remain dilated up to 45-50 millimeters diameter there is a small right inguinal hernia which appears to contain only fluid as before. There is a small volume of gas and stool in the right colon to the hepatic flexure. The remainder of the transverse, the descending and rectosigmoid colon remain completely decompressed. Decompressed stomach and duodenum. Small volume free fluid. No definite pneumoperitoneum. Lymphatic: No lymphadenopathy. Reproductive: Interval left inguinal hernia repair with no adverse features. Small fluid only containing right inguinal hernia appears stable. Other: Small volume pelvic free fluid with simple fluid density. Musculoskeletal: No acute osseous abnormality identified. Previous distal left tib fib ORIF. IMPRESSION: NON-VASCULAR 1. Persistent High-grade Small Bowel Obstruction despite satisfactory appearance of the recent left inguinal hernia repair. Transition point remains in the left lower quadrant as seen on series 4, image 147 and coronal series 7, image 146. Trace free fluid. No free air identified. 2. Chest CTA today is reported separately. VASCULAR 1. Aortic Atherosclerosis (ICD10-I70.0), negative for dissection or aneurysm. 2. Occluded Left Iliac Arteries, the internal is reconstituted distally. Patent Left Common Femoral artery, reconstituted in part from the inferior epigastric. Patent Right Iliac Arteries, patent right external iliac stent. Occluded bilateral SFAs, both reconstituted distally near the  popliteal junctions. 3. Patent but attenuated bilateral three-vessel runoff, worse on the left. 4. Moderate or severe stenosis at the Right Renal Artery origin, Right Internal Iliac Artery origin. Electronically Signed: By: Genevie Ann M.D. On: 06/21/2018 22:52    Scheduled Meds: . acetylcysteine  4 mL Nebulization TID  . amLODipine  10 mg Oral Daily  . budesonide (PULMICORT) nebulizer solution  0.5 mg Nebulization BID  . carvedilol  12.5 mg Oral BID WC  . clopidogrel  75 mg Oral Daily  . enoxaparin (LOVENOX) injection  30 mg Subcutaneous Q24H  . famotidine  20 mg Oral QAC supper  . feeding supplement (ENSURE ENLIVE)  237 mL Oral TID BM  . folic acid  1 mg Oral Daily  . furosemide  40 mg Intravenous Once  . guaiFENesin  1,200 mg Oral BID  . hydrocortisone-pramoxine   Rectal QID  . ipratropium-albuterol  3 mL Nebulization TID  . mouth rinse  15 mL Mouth Rinse BID  . multivitamin with minerals  1 tablet Oral Daily  . mupirocin ointment  1 application Nasal BID  . nicotine  14 mg Transdermal Daily  . potassium chloride  40 mEq Oral Q4H  . sodium chloride (PF)      . thiamine  100 mg Oral Daily    Continuous Infusions: . magnesium sulfate 1 - 4 g bolus IVPB 4 g (06/22/18 0802)  . methocarbamol (ROBAXIN) IV 500 mg (06/16/18 2304)    Time spent: 100mins I have personally reviewed and interpreted on  06/19/2018 daily labs,  imagings as discussed above under date review session and assessment and plans.  I reviewed all nursing notes, pharmacy notes, consultant notes,  vitals, pertinent old records  I have discussed plan of care as described above with RN , patient   on 06/19/2018  Florencia Reasons MD, PhD  [facility] Pager [phone]. If 7PM-7AM, please contact night-coverage at [email or web address], password [facility] 06/22/2018, 8:02 AM  LOS: 8 days

## 2018-06-22 NOTE — Progress Notes (Signed)
PHARMACY - ADULT TOTAL PARENTERAL NUTRITION CONSULT NOTE   Pharmacy Consult for TPN Indication: NPO for incarcerated bowel; too malnourished for surgery  Patient Measurements: Height: 5\' 2"  (157.5 cm) Weight: 89 lb 4.6 oz (40.5 kg) IBW/kg (Calculated) : 54.6 TPN AdjBW (KG): 40.5 Body mass index is 16.33 kg/m. 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 Presents this admission with SBO due to hernia, now s/p open inguinal hernia repair but CT still shows persistent SBO separate from hernia repair site - likely d/t edema/congestion from prior incarceration.  Central access: none, PICC placement ordered TPN start date: 1/14 pending PICC placement  Significant events:   Insulin Requirements: none ordered  Current Nutrition: FLD but changing to NPO  IVF: none  Today, 06/22/2018:  Glucose (CBG goal 100-150) - mostly well controlled off TPN  Electrolytes - K, Mg, Cl, Phos low; Na, Ca WNL  Renal - SCr stable WNL; bicarb elevated; BUN improved to WNL  LFTs - WNL  TGs - pending  Prealbumin - moderately low  NUTRITIONAL GOALS                                                                                             RD recs: (1/13) Kcal:  1450-1650 kcal Protein:  70-85 grams Fluid:  >/= 1.4 L/day  PLAN                                                                                                                          Mg 4g IV x 1 per MD  Kdur 40 mEq PO x 2 per MD  Will add Kphos Neutral 250 mg PO x 2  At 1800 today:  Initiate TPN at 40 ml/hr; plan to advance as tolerated to the goal rate  TPN at goal rate of 65 ml/hr provides 78 g of protein, 324  g of dextrose, and 47 g of lipids for a total of  1576 kcals meeting 100% of patient needs  Will omit MVI and trace elements as patient still taking PO meds  Electrolytes in TPN: standard concentration; Cl:Ac = max Cl  Initiate sensitive SSI with q6 hr CBG checks  TPN lab panels on Mondays & Thursdays; BMP, Mg, Phos tomorrow AM  F/u daily  Reuel Boom, PharmD, BCPS [phone] 06/22/2018, 9:03 AM

## 2018-06-22 NOTE — Progress Notes (Addendum)
4 Days Post-Op    CC:  Subjective: He is very frail appearing, on oxygen, upper airway congestion.  Tolerating full liquids without n/v. +Flatus and BMs.  Objective: Vital signs in last 24 hours: Temp:  [97.5 F (36.4 C)-97.6 F (36.4 C)] 97.6 F (36.4 C) (01/14 0538) Pulse Rate:  [73-85] 73 (01/14 0538) Resp:  [16-17] 16 (01/14 0538) BP: (139-169)/(78-90) 169/88 (01/14 0538) SpO2:  [91 %-96 %] 91 % (01/14 0745) Last BM Date: 06/22/18 Intake/Output from previous day: 01/13 0701 - 01/14 0700 In: 1960.1 [P.O.:270; I.V.:1640; IV Piggyback:50.1] Out: 5400 [Urine:1375] Intake/Output this shift: No intake/output data recorded.  General appearance: alert, cooperative and He is short of breath at baseline with hoarseness Resp: Wheezing bilaterally GI: Abdomen is soft, nontender, moderately distened; no rebound; no guarding. Incision c/d/i without erythema  Lab Results:  Recent Labs    06/21/18 0427 06/22/18 0520  WBC 14.7* 15.5*  HGB 11.5* 11.5*  HCT 34.3* 35.1*  PLT 300 349    BMET Recent Labs    06/21/18 0427 06/22/18 0520  NA 132* 136  K 3.3* 3.3*  CL 88* 92*  CO2 34* 35*  GLUCOSE 68* 164*  BUN 15 10  CREATININE 0.68 0.62  CALCIUM 8.8* 8.6*   PT/INR No results for input(s): LABPROT, INR in the last 72 hours.  Recent Labs  Lab 06/22/18 0520  AST 32  ALT 20  ALKPHOS 57  BILITOT 0.8  PROT 6.1*  ALBUMIN 3.0*     Lipase     Component Value Date/Time   LIPASE 53 (H) 06/13/2018 1924     Medications: . acetylcysteine  4 mL Nebulization TID  . amLODipine  10 mg Oral Daily  . budesonide (PULMICORT) nebulizer solution  0.5 mg Nebulization BID  . carvedilol  12.5 mg Oral BID WC  . clopidogrel  75 mg Oral Daily  . enoxaparin (LOVENOX) injection  30 mg Subcutaneous Q24H  . famotidine  20 mg Oral QAC supper  . feeding supplement (ENSURE ENLIVE)  237 mL Oral TID BM  . folic acid  1 mg Oral Daily  . guaiFENesin  1,200 mg Oral BID  .  hydrocortisone-pramoxine   Rectal QID  . ipratropium-albuterol  3 mL Nebulization TID  . mouth rinse  15 mL Mouth Rinse BID  . multivitamin with minerals  1 tablet Oral Daily  . mupirocin ointment  1 application Nasal BID  . nicotine  14 mg Transdermal Daily  . potassium chloride  40 mEq Oral Q4H  . sodium chloride (PF)      . thiamine  100 mg Oral Daily   . magnesium sulfate 1 - 4 g bolus IVPB 4 g (06/22/18 0802)  . methocarbamol (ROBAXIN) IV 500 mg (06/16/18 2304)   Anti-infectives (From admission, onward)   Start     Dose/Rate Route Frequency Ordered Stop   06/18/18 0600  ceFAZolin (ANCEF) IVPB 2g/100 mL premix  Status:  Discontinued     2 g 200 mL/hr over 30 Minutes Intravenous On call to O.R. 06/18/18 0531 06/18/18 1211      Assessment/Plan Non-small cell lung cancer/history of tobacco use ongoing  -Tobacco use until admission/refusing nicotine patch/cold turkey/nicotine lozengers  from    home COPD with acute respiratory distress -postop Acute metabolic encephalopathy/postop delirium  -fall/ongoing confusion 11/12  -IV Lasix 1/10 & 1/11  -Dr. Erlinda Hong following - Medicine Hyponatremia  History of alcohol abuse Hypertension Peripheral artery disease/claudication Malnutrition/BMI 16.33 - prealbumin ordered  Incarcerated recurrent left inguinal hernia  with small bowel obstruction Open inguinal hernia repair, 06/18/2018, Dr. Armandina Gemma POD #4 - CTA shows persistent SBO in LLQ but not at hernia repair site - most likely 2/2 edema/congestion at that site  FEN: IV fluids stopped/clears =>> D5 half NS +20KCL@ 100 cc/hr  -Given distention, would make NPO for time being; MIVF  -Could be related to edema in the incarcerated knuckle of bowel he had - this was reportedly seen intraoperatively and appeared clearly viable.  -Nutrition consult  -Pre-albumin pending DVT: Lovenox Follow-up with Dr. Harlow Asa  Plan: Pulmonary seeing him now and going to work on pulmonary issues   LOS: 8  days   Sharon Mt. Dema Severin, M.D. [facility]

## 2018-06-22 NOTE — Evaluation (Signed)
Occupational Therapy Evaluation Patient Details Name: [name] MRN: [id number] DOB: 06-30-1950 Today's Date: 06/22/2018    History of Present Illness Patient has poor pulmonary reserve due to advanced COPD, continued smoker, now s/p inguinal hernia repair, had increased confusion post op, at risk of decompensation, was transferred to stepdown unit on 1/10; pt now on medical floor, had a fall this morning reportedly trying to get to bathroom;   Clinical Impression   Pt admitted with fall and confusion. Pt currently with functional limitations due to the deficits listed below (see OT Problem List).  Pt will benefit from skilled OT to increase their safety and independence with ADL and functional mobility for ADL to facilitate discharge to venue listed below.      Follow Up Recommendations  SNF;Home health OT;Supervision/Assistance - 24 hour    Equipment Recommendations  None recommended by OT    Recommendations for Other Services       Precautions / Restrictions Precautions Precautions: Fall Precaution Comments: O2,monitor sats Restrictions Weight Bearing Restrictions: No      Mobility Bed Mobility Overal bed mobility: Needs Assistance Bed Mobility: Sit to Supine       Sit to supine: Supervision   General bed mobility comments: for safety  Transfers Overall transfer level: Needs assistance Equipment used: Rolling walker (2 wheeled) Transfers: Sit to/from Stand Sit to Stand: Min guard;Min assist         General transfer comment: cues for safety and hand placement    Balance Overall balance assessment: Needs assistance;History of Falls Sitting-balance support: No upper extremity supported;Feet supported Sitting balance-Leahy Scale: Fair       Standing balance-Leahy Scale: Poor Standing balance comment: reliant on UEs                           ADL either performed or assessed with clinical judgement   ADL Overall ADL's : Needs  assistance/impaired Eating/Feeding: Set up;Sitting   Grooming: Set up;Sitting   Upper Body Bathing: Minimal assistance;Sitting   Lower Body Bathing: Moderate assistance;Sit to/from stand;Cueing for sequencing;Cueing for safety   Upper Body Dressing : Minimal assistance;Sitting   Lower Body Dressing: Moderate assistance;Sit to/from stand;Cueing for sequencing;Cueing for safety       Toileting- Clothing Manipulation and Hygiene: Sit to/from stand;Cueing for sequencing;Cueing for safety         General ADL Comments: Pt will benefit from energy conservation strategies - will provide handout and educate pt     Vision Patient Visual Report: No change from baseline       Perception     Praxis      Pertinent Vitals/Pain Pain Assessment: No/denies pain           Communication Communication Communication: No difficulties   Cognition Arousal/Alertness: Awake/alert Behavior During Therapy: WFL for tasks assessed/performed Overall Cognitive Status: Impaired/Different from baseline Area of Impairment: Orientation;Following commands;Problem solving                 Orientation Level: Disoriented to;Time;Place     Following Commands: Follows one step commands consistently;Follows multi-step commands inconsistently     Problem Solving: Slow processing;Decreased initiation;Difficulty sequencing;Requires verbal cues;Requires tactile cues General Comments: tele sitter              Home Living Family/patient expects to be discharged to:: Private residence Living Arrangements: Spouse/significant other Available Help at Discharge: Family     Entrance Stairs-Number of Steps: [place]: One level  Bathroom Shower/Tub: Chief Strategy Officer: None          Prior Functioning/Environment Level of Independence: Independent                 OT Problem List: Decreased strength;Cardiopulmonary status limiting activity;Decreased  knowledge of use of DME or AE;Impaired balance (sitting and/or standing);Decreased safety awareness      OT Treatment/Interventions: Self-care/ADL training;Patient/family education;Energy conservation;DME and/or AE instruction    OT Goals(Current goals can be found in the care plan section) Acute Rehab OT Goals Patient Stated Goal: to go home soon OT Goal Formulation: With patient Time For Goal Achievement: 06/29/18 Potential to Achieve Goals: Good ADL Goals Pt Will Perform Grooming: with supervision;standing Pt Will Perform Lower Body Dressing: with supervision;sit to/from stand Pt Will Transfer to Toilet: with supervision;bedside commode;stand pivot transfer Pt Will Perform Toileting - Clothing Manipulation and hygiene: with supervision;sit to/from stand Additional ADL Goal #1: Pt will verbalize energy conservation strategies at mod I level in regards to ADL activity  OT Frequency: Min 2X/week   Barriers to D/C:               AM-PAC OT "6 Clicks" Daily Activity     Outcome Measure Help from another person eating meals?: A Little Help from another person taking care of personal grooming?: A Little Help from another person toileting, which includes using toliet, bedpan, or urinal?: A Lot Help from another person bathing (including washing, rinsing, drying)?: A Lot Help from another person to put on and taking off regular upper body clothing?: A Little Help from another person to put on and taking off regular lower body clothing?: A Lot 6 Click Score: 15   End of Session Equipment Utilized During Treatment: Rolling walker Nurse Communication: Mobility status  Activity Tolerance: Patient limited by lethargy Patient left: with call bell/phone within reach;with bed alarm set;Other (comment)(tele sitter)  OT Visit Diagnosis: Unsteadiness on feet (R26.81);Other abnormalities of gait and mobility (R26.89);Repeated falls (R29.6);Muscle weakness (generalized) (M62.81)                 Time: 2694-8546 OT Time Calculation (min): 13 min Charges:  OT General Charges $OT Visit: 1 Visit OT Evaluation $OT Eval Moderate Complexity: 1 Mod  Kari Baars, [place] [phone] Office- [phone], Edwena Felty D 06/22/2018, 3:38 PM

## 2018-06-22 NOTE — Progress Notes (Signed)
VASCULAR SURGERY:  I have reviewed his CT angiogram.  Based on his arteriogram, his only option for revascularization on the left would be a right to left femorofemoral bypass graft and a left femoral to below-knee popliteal artery bypass.  He has diffuse calcific disease in the arteries are small.  The stent in the right external iliac artery extends low into the external iliac artery however it does look like there is room to clamp below that.  Regardless, currently I do not think he could tolerate major vascular surgery given his respiratory status, severely debilitated state, and severe protein calorie malnutrition.  Once he is discharged I can follow the very small wound on his left great toe as an outpatient.  Deitra Mayo, MD, [place] [facility] [phone] Office: [phone]

## 2018-06-22 NOTE — Progress Notes (Signed)
Peripherally Inserted Central Catheter/Midline Placement  The IV Nurse has discussed with the patient and/or persons authorized to consent for the patient, the purpose of this procedure and the potential benefits and risks involved with this procedure.  The benefits include less needle sticks, lab draws from the catheter, and the patient may be discharged home with the catheter. Risks include, but not limited to, infection, bleeding, blood clot (thrombus formation), and puncture of an artery; nerve damage and irregular heartbeat and possibility to perform a PICC exchange if needed/ordered by physician.  Alternatives to this procedure were also discussed.  Bard Power PICC patient education guide, fact sheet on infection prevention and patient information card has been provided to patient /or left at bedside.    PICC/Midline Placement Documentation        [name] 06/22/2018, 4:24 PM

## 2018-06-23 ENCOUNTER — Inpatient Hospital Stay (HOSPITAL_COMMUNITY): Payer: Medicare Other

## 2018-06-23 DIAGNOSIS — R0902 Hypoxemia: Secondary | ICD-10-CM

## 2018-06-23 LAB — COMPREHENSIVE METABOLIC PANEL
ALT: 21 U/L (ref 0–44)
AST: 29 U/L (ref 15–41)
Albumin: 2.7 g/dL — ABNORMAL LOW (ref 3.5–5.0)
Alkaline Phosphatase: 56 U/L (ref 38–126)
Anion gap: 9 (ref 5–15)
BUN: 16 mg/dL (ref 8–23)
CO2: 34 mmol/L — ABNORMAL HIGH (ref 22–32)
Calcium: 8.5 mg/dL — ABNORMAL LOW (ref 8.9–10.3)
Chloride: 93 mmol/L — ABNORMAL LOW (ref 98–111)
Creatinine, Ser: 0.56 mg/dL — ABNORMAL LOW (ref 0.61–1.24)
GFR calc Af Amer: >60 mL/min (ref >60)
GFR calc non Af Amer: >60 mL/min (ref >60)
GLUCOSE: 182 mg/dL — AB (ref 70–99)
Potassium: 3.9 mmol/L (ref 3.5–5.1)
Sodium: 136 mmol/L (ref 135–145)
Total Bilirubin: 0.5 mg/dL (ref 0.3–1.2)
Total Protein: 5.8 g/dL — ABNORMAL LOW (ref 6.5–8.1)

## 2018-06-23 LAB — CBC WITH DIFFERENTIAL/PLATELET
Abs Immature Granulocytes: 0.14 10*3/uL — ABNORMAL HIGH (ref 0.00–0.07)
Basophils Absolute: 0 10*3/uL (ref 0.0–0.1)
Basophils Relative: 0 %
EOS PCT: 0 %
Eosinophils Absolute: 0 10*3/uL (ref 0.0–0.5)
HCT: 30.3 % — ABNORMAL LOW (ref 39.0–52.0)
Hemoglobin: 10 g/dL — ABNORMAL LOW (ref 13.0–17.0)
Immature Granulocytes: 2 %
Lymphocytes Relative: 5 %
Lymphs Abs: 0.4 10*3/uL — ABNORMAL LOW (ref 0.7–4.0)
MCH: 31.8 pg (ref 26.0–34.0)
MCHC: 33 g/dL (ref 30.0–36.0)
MCV: 96.5 fL (ref 80.0–100.0)
MONO ABS: 0.4 10*3/uL (ref 0.1–1.0)
Monocytes Relative: 4 %
Neutro Abs: 8.1 10*3/uL — ABNORMAL HIGH (ref 1.7–7.7)
Neutrophils Relative %: 89 %
Platelets: 338 10*3/uL (ref 150–400)
RBC: 3.14 MIL/uL — ABNORMAL LOW (ref 4.22–5.81)
RDW: 14.6 % (ref 11.5–15.5)
WBC: 9.1 10*3/uL (ref 4.0–10.5)
nRBC: 0 % (ref 0.0–0.2)

## 2018-06-23 LAB — PHOSPHORUS: Phosphorus: 2.5 mg/dL (ref 2.5–4.6)

## 2018-06-23 LAB — MAGNESIUM: Magnesium: 2 mg/dL (ref 1.7–2.4)

## 2018-06-23 LAB — TRIGLYCERIDES: Triglycerides: 103 mg/dL (ref <150)

## 2018-06-23 LAB — GLUCOSE, CAPILLARY
Glucose-Capillary: 194 mg/dL — ABNORMAL HIGH (ref 70–99)
Glucose-Capillary: 131 mg/dL — ABNORMAL HIGH (ref 70–99)
Glucose-Capillary: 147 mg/dL — ABNORMAL HIGH (ref 70–99)

## 2018-06-23 MED ORDER — TRAVASOL 10 % IV SOLN
INTRAVENOUS | Status: AC
  Administered 2018-06-23: 18:00:00 via INTRAVENOUS
  Filled 2018-06-23: qty 780

## 2018-06-23 MED ORDER — INSULIN ASPART 100 UNIT/ML ~~LOC~~ SOLN
0.0000 [IU] | Freq: Four times a day (QID) | SUBCUTANEOUS | Status: DC
  Administered 2018-06-23 (×2): 2 [IU] via SUBCUTANEOUS
  Administered 2018-06-24: 5 [IU] via SUBCUTANEOUS
  Administered 2018-06-24: 2 [IU] via SUBCUTANEOUS

## 2018-06-23 MED ORDER — METHYLPREDNISOLONE SODIUM SUCC 40 MG IJ SOLR: 40.0000 mg | INTRAMUSCULAR | Status: DC

## 2018-06-23 NOTE — Progress Notes (Addendum)
5 Days Post-Op    CC: Abdominal pain  Subjective: Patient sitting up in the chair today.  His belly feels a little bit softer.  He is reporting loose stools.  Therefore stools recorded in the I&O.  Surgical site looks fine.  Objective: Vital signs in last 24 hours: Temp:  [97.4 F (36.3 C)-98.3 F (36.8 C)] 98.3 F (36.8 C) (01/15 0511) Pulse Rate:  [69-81] 78 (01/15 0511) Resp:  [15-19] 15 (01/15 0511) BP: (128-161)/(73-87) 161/87 (01/15 0511) SpO2:  [90 %-98 %] 92 % (01/15 0810) Last BM Date: 06/23/18 CT 1/13:  Persistent High-grade Small Bowel Obstruction despite satisfactory appearance of the recent left inguinal hernia repair. Transition point remains in the left lower quadrant as seen on series 4, image 147 and coronal series 7, image 146. Trace free fluid. No free air identified. Occluded Left Iliac Arteries, the internal is reconstituted distally. Patent Left Common Femoral artery, reconstituted in part from the inferior epigastric. Patent Right Iliac Arteries, patent right external iliac stent. Occluded bilateral SFAs, both reconstituted distally near the popliteal junctions. 3. Patent but attenuated bilateral three-vessel runoff, worse on the left. 4. Moderate or severe stenosis at the Right Renal Artery origin, Right Internal Iliac Artery origin. No evidence of acute pulmonary embolus. 2. Thoracic aortic atherosclerosis with no associated dissection or aortic aneurysm in the chest. Emphysema ([id number]-G18.9) with scattered bilateral bronchopneumonia and small to moderate bilateral layering pleural effusions  N.p.o. 959 IV 1575 urine Stool x4 Afebrile vital signs are stable Labs this a.m. shows a glucose of 182, chloride of 93, CO2 34, creatinine 0.56, WBC is 9.1, hemoglobin 10 hematocrit 30. Intake/Output from previous day: 01/14 0701 - 01/15 0700 In: 959.2 [I.V.:509.4; IV Piggyback:449.8] Out: 2993 [Urine:1575] Intake/Output this shift: No intake/output  data recorded.  General appearance: alert, cooperative, no distress and He is a little confused but not too badly. Resp: Still wheezing some less rhonchi.  Sounds better than he has in the last couple days. GI: Still distended, but softer and less tense than yesterday.  Surgical site looks fine.  He is reporting loose stools.  Lab Results:  Recent Labs    06/22/18 0520 06/23/18 0501  WBC 15.5* 9.1  HGB 11.5* 10.0*  HCT 35.1* 30.3*  PLT 349 338    BMET Recent Labs    06/22/18 0520 06/23/18 0501  NA 136 136  K 3.3* 3.9  CL 92* 93*  CO2 35* 34*  GLUCOSE 164* 182*  BUN 10 16  CREATININE 0.62 0.56*  CALCIUM 8.6* 8.5*   PT/INR No results for input(s): LABPROT, INR in the last 72 hours.  Recent Labs  Lab 06/22/18 0520 06/23/18 0501  AST 32 29  ALT 20 21  ALKPHOS 57 56  BILITOT 0.8 0.5  PROT 6.1* 5.8*  ALBUMIN 3.0* 2.7*     Lipase     Component Value Date/Time   LIPASE 53 (H) 06/13/2018 1924     Medications: . budesonide (PULMICORT) nebulizer solution  0.5 mg Nebulization BID  . enoxaparin (LOVENOX) injection  30 mg Subcutaneous Q24H  . feeding supplement (ENSURE ENLIVE)  237 mL Oral TID BM  . folic acid  1 mg Intravenous Daily  . hydrocortisone-pramoxine   Rectal QID  . insulin aspart  0-9 Units Subcutaneous Q6H  . ipratropium-albuterol  3 mL Nebulization Q6H  . mouth rinse  15 mL Mouth Rinse BID  . methylPREDNISolone (SOLU-MEDROL) injection  40 mg Intravenous Q24H  . metoprolol tartrate  2.5 mg Intravenous Q8H  .  thiamine injection  100 mg Intravenous Daily    Assessment/Plan  Non-small cell lung cancer/history of tobacco useongoing -Tobacco use until admission/refusing nicotine patch/cold turkey/nicotinelozengersfrom home COPD with acute respiratory distress-postop Acute metabolic encephalopathy/postop delirium -fall/ongoing confusion 11/12 -IV Lasix 1/10&1/11 -Dr.Xu following - Medicine Hyponatremia - improved   Hypokalemia/hypomagnesmia - will replace Leukocytosis  History of alcohol abuse Hypertension Peripheral artery disease/critical left limb ischemia- non healing left left first metatarsal head  Severe Malnutrition/BMI 16.33 -prealbumin 11.3  Incarcerated recurrent left inguinal hernia with small bowel obstruction w transition LLQ  Open inguinal hernia repair, 06/18/2018, Dr. Armandina Gemma POD #3   - repeat CT 1/13: persistent high grade SBO  - Plain film 1/15:  Ongoing severe abdominal SB distension consistent with SBO  FEN: IV fluids stopped/full=>>D5 half NS +20KCL@ 100cc/hr -TNA started 06/22/18 -Nutrition consult -Pre-albumin pending ID: Preop Ancef DVT: Lovenox Follow-up with Dr.GERKIN  Plan: Give him some ice chips, and recheck his film.  Need to have him up walking as much as possible.    Continue TNA.    Film today shows he is still very distended.    LOS: 9 days   JENNINGS,WILLARD 06/23/2018 [phone]  Agree with above. But he looked puny to me today.  Alphonsa Overall, MD, [facility] Pager: [phone] Office phone:  [phone]

## 2018-06-23 NOTE — Progress Notes (Signed)
PROGRESS NOTE    Desean Heemstra  XHB:[id number] DOB: [birth year] DOA: 06/13/2018 PCP: [name], MD    Brief Narrative: 68 year old with past medical history significant for COPD not on home oxygen, peripheral vascular disease a status post DES in 03/2018, history of non-small cell lung cancer status post radiation who presents with nausea vomiting found to have SBO from inguinal hernia. Patient was cleared by cardiology for surgery.  Patient underwent left inguinal hernia repair with mesh on June 18, 2018 Dr. Harle Stanford  Assessment & Plan:   Principal Problem:   Incarcerated left inguinal hernia s/p repair 06/18/2018 Active Problems:   Smoker   COPD (chronic obstructive pulmonary disease) ([place])   Essential hypertension   Protein-calorie malnutrition, severe (HCC)   Severe claudication (HCC)   PAD (peripheral artery disease) ([place])   Acute hypoxemic respiratory failure (HCC)   Acute metabolic encephalopathy   SBO (small bowel obstruction) ([place])   Inguinal hernia of left side with obstruction and without gangrene   1-SBO 2/2 left inguinal hernia --On general surgery service, Plavix held for 5 days, he is brought to the OR  On 1/10 and underwent OPEN RECURRENT LEFT INGUINAL HERNIA REPAIR WITH MESH (Left), INSERTION OF MESH (Left) -general surgery oked to Resume plavix  -he is started on TPN on 1/14 due to persistent ileus, hold all oral meds --plan per general surgery Per CT angios on January 13 there is persistent high-grade small bowel obstruction Patient reports having bowel movement.  Plan is to repeat KUB in the morning.  Postop hypoxia  -Patient has poor pulmonary reserve due to advanced COPD, continued smoker,  status post abdomen surgery, with increased confusion, at risk of decompensation, he is transferred to stepdown unit on 1/10 -stat chest x-ray obtained on 1/10 showed "Small new bilateral pleural effusions with probable atelectasis at the left base" he received lasix  20mg  iv x1 on 1/10 and 20mg  iv x1 on 1/11 with good diruesis, he received another dose of lasix 40mg  on 1/14. Will likely need lasix prn while on TPN. -critical care consulted, input appreciated --persistent hypoxia, girlfriends reports this is new,  CTA chest no PE, does show mucus plugging and infiltrates, he start to have diffuse wheezing on 1/14, will treat as COPD exacerbation, start steroids, increase nebs, chest PT, start rocephin/zithro , plan for 5 days abx treatment. Discussed with CCM will try to taper off IV steroid. Respiratory  toilet.  Acute metabolic encephalopathy/delirium on 1/10: Likely multifactorial , alcohol withdrawal? Hyponatremia, surgery with anesthesia, inhospital delirium -per family patient has h/o confusion in the past during hospitalization -ct head no acute findings, lactic acid wnl, blood culture no growth - avoid dilaudid ,ativan , d/c tele -low dose zyprexa started on 1/12, girlfriend states it made him more agitated.  Avoid Zyprexa   H/o internal hemorrhoids -Seen by Dr. Thana Farr in April 2019, s/p rubber band ligation  -Blood per rectum this morning, denies abdominal pain -Defer to general surgery  Hyponatremia: Sodium nadir at 126, improving at 136 today on 1/14 encourage oral intake   Hypokalemia:  Today at 3.9.  Hypomagnesemia: Normalize repeat labs in the morning  COPD/heavy smoker -Not on home O2 -Likely will benefit O2 at discharge ( per RN prior to surgery patient o2 dropped to the 70's on room air  while Ambulating with one person  Assist to the bathroom) -need outpatient follow up for COPD  Left upper lobe lung cancer  Lung nodules x2 left upper lobe and 1 left lower lobe  s/p XRT -Case discussed with radiation oncology Dr. Tammi Klippel who stated patient has stage Ia status post curative intent treatment XRT finished in July 2019 -Per Dr. Tammi Klippel patient missed his restaging scan, Dr. Tammi Klippel recommended CT chest with IV contrast  prior to discharge which is done on 1/13 -pa and follow with him every 32-month.   PAD s/p stent -PAD s/pSuccessful self expanding Eluvia 7.0 X 60 mm stent placement Rt external iliac arteryon 03/30/2018 by cardiology Dr Virgina Jock,  -Has an appointment with vascular surgery Dr Donnetta Hutching on 2/11 -Girl friend reports patient has an appointment with Dr Donnetta Hutching on 1/6 however patient was admitted to the hospital Girl friend reports patient has increase pain on left leg while walking, she request vascular being consulted while patient is in the hospital, will contact vascular surgery. -vascular surgery Dr Scot Dock input appreciated, patient underwent CT angiogram, per Dr Scot Dock think patient likely will need bypass surgery, but do not think he could tolerate major vascular surgery given his respiratory status, severely debilitated state, and severe protein calorie malnutrition., recommend outpatient follow up with him.  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 Nutrition consult on TPN/       Nutrition Problem: Severe Malnutrition Etiology: chronic illness(COPD)    Signs/Symptoms: energy intake < or equal to 75% for > or equal to 1 month, severe fat depletion, severe muscle depletion, percent weight loss    Interventions: MVI, [employer] Instant Breakfast  Estimated body mass index is 15.32 kg/m as calculated from the following:   Height as of this encounter: 5\' 2"  (1.575 m).   Weight as of this encounter: 38 kg.   DVT prophylaxis: Lovenox Code Status: Full code Family Communication; care discussed with patient Disposition Plan: Patient with persistent anemia, requiring IV  TPN.  Consultants:   General surgery as primary  hospitalist as consultant  Cardiology as consultant  Pulmonary/critical care   Procedures:  Ng placement and removal  on 1/10 s/p OPEN RECURRENT LEFT INGUINAL HERNIA REPAIR WITH MESH (Left), INSERTION OF MESH (Left) TPN    Antimicrobials:   Perioperative  Rocephin/zithro from 1/14 , plan for 5 days   Subjective: Sitting in recliner, denies worsening abdominal pain.  Is been a little bit confused.  He report that he had a bowel movement.  Objective: Vitals:   06/23/18 0511 06/23/18 0810 06/23/18 1330 06/23/18 1421  BP: (!) 161/87  (!) 147/73   Pulse: 78  71   Resp: 15  16   Temp: 98.3 F (36.8 C)  98.2 F (36.8 C)   TempSrc: Oral  Oral   SpO2: 94% 92% 96% 96%  Weight:      Height:        Intake/Output Summary (Last 24 hours) at 06/23/2018 1733 Last data filed at 06/23/2018 1445 Gross per 24 hour  Intake 887.53 ml  Output 450 ml  Net 437.53 ml   Filed Weights   06/18/18 0924 06/18/18 1437 06/22/18 0845  Weight: 38.1 kg 40.5 kg 38 kg    Examination:  General exam: Cachectic Respiratory system: Bilateral rhonchus no wheezing Cardiovascular system: S1 & S2 heard, RRR. No JVD, murmurs, rubs, gallops or clicks. No pedal edema. Gastrointestinal system: Abdomen is soft, distended, incision and inguinal  area clean and healing Central nervous system: Alert and confused Extremities: Symmetric 5 x 5 power. Skin: No rashes, lesions or ulcers    Data Reviewed: I have personally reviewed following labs and imaging studies  CBC: Recent Labs  Lab 06/18/18 0441  06/19/18 0316 06/20/18 0302 06/21/18 0427 06/22/18 0520 06/23/18 0501  WBC 4.4   < > 7.0 9.9 14.7* 15.5* 9.1  NEUTROABS 3.2  --   --   --   --   --  8.1*  HGB 11.5*   < > 9.2* 9.3* 11.5* 11.5* 10.0*  HCT 34.3*   < > 27.3* 28.1* 34.3* 35.1* 30.3*  MCV 94.8   < > 96.1 96.9 96.3 94.9 96.5  PLT 252   < > 210 238 300 349 338   < > = values in this  interval not displayed.   Basic Metabolic Panel: Recent Labs  Lab 06/18/18 0441  06/19/18 0316 06/20/18 0302 06/21/18 0427 06/22/18 0520 06/23/18 0501  NA 132*  --  132* 132* 132* 136 136  K 5.1  --  3.9 3.7 3.3* 3.3* 3.9  CL 94*  --  97* 94* 88* 92* 93*  CO2 27  --  26 28 34* 35* 34*  GLUCOSE 95  --  201* 107* 68* 164* 182*  BUN 31*  --  27* 19 15 10 16   CREATININE 1.08   < > 0.88 0.72 0.68 0.62 0.56*  CALCIUM 8.9  --  7.9* 8.4* 8.8* 8.6* 8.5*  MG 1.8  --   --  1.3* 1.8 1.6* 2.0  PHOS  --   --   --   --   --  2.2* 2.5   < > = values in this interval not displayed.   GFR: Estimated Creatinine Clearance: 48.8 mL/min (A) (by C-G formula based on SCr of 0.56 mg/dL (L)). Liver Function Tests: Recent Labs  Lab 06/22/18 0520 06/23/18 0501  AST 32 29  ALT 20 21  ALKPHOS 57 56  BILITOT 0.8 0.5  PROT 6.1* 5.8*  ALBUMIN 3.0* 2.7*   No results for input(s): LIPASE, AMYLASE in the last 168 hours. No results for input(s): AMMONIA in the last 168 hours. Coagulation Profile: No results for input(s): INR, PROTIME in the last 168 hours. Cardiac Enzymes: No results for input(s): CKTOTAL, CKMB, CKMBINDEX, TROPONINI in the last 168 hours. BNP (last 3 results) No results for input(s): PROBNP in the last 8760 hours. HbA1C: No results for input(s): HGBA1C in the last 72 hours. CBG: Recent Labs  Lab 06/18/18 1712 06/22/18 2351 06/23/18 0505 06/23/18 1136  GLUCAP 115* 211* 194* 131*   Lipid Profile: Recent Labs    06/23/18 0505  TRIG 103   Thyroid Function Tests: Recent Labs    06/22/18 0520  TSH 3.982   Anemia Panel: No results for input(s): VITAMINB12, FOLATE, FERRITIN, TIBC, IRON, RETICCTPCT in the last 72 hours. Sepsis Labs: Recent Labs  Lab 06/19/18 1954  LATICACIDVEN 1.5    Recent Results (from the past 240 hour(s))  Surgical PCR screen     Status: None   Collection Time: 06/18/18  1:47 AM  Result Value Ref Range Status   MRSA, PCR NEGATIVE NEGATIVE Final    Staphylococcus aureus NEGATIVE NEGATIVE Final    Comment: (NOTE) The Xpert SA Assay (FDA approved for NASAL specimens in patients 76 years of age and older), is one component of a comprehensive surveillance program. It is not intended to diagnose infection nor to guide or monitor treatment.  Performed at [facility], [place] [address] [place], [place] [address]   Culture, blood (routine x 2)     Status: None (Preliminary result)   Collection Time: 06/19/18  7:54 PM  Result Value Ref Range Status   Specimen Description BLOOD LEFT ANTECUBITAL  Final   Special Requests   Final    BOTTLES DRAWN AEROBIC ONLY Blood Culture adequate volume Performed at [facility] [address] [place], [place] [address]    Culture   Final    NO GROWTH 4 DAYS Performed at [facility], [place] [address], [place], [place] [address]    Report Status PENDING  Incomplete  Culture, blood (routine x 2)     Status: None (Preliminary result)   Collection Time: 06/19/18  7:54 PM  Result Value Ref Range Status   Specimen Description   Final    BLOOD LEFT ARM Performed at [facility] [address] [place], [place] [address]    Special Requests   Final    BOTTLES DRAWN AEROBIC ONLY Blood Culture adequate volume Performed at [place] [address], [place], [place] [address]    Culture   Final    NO GROWTH 4 DAYS Performed at [facility], [place] [address], [place], [place] [address]    Report Status PENDING  Incomplete  Expectorated sputum assessment w rflx to resp cult     Status: None   Collection Time: 06/22/18  4:50 PM  Result Value Ref Range Status   Specimen Description SPU EXPECTORATED  Final   Special Requests NONE  Final   Sputum evaluation   Final    Sputum specimen not acceptable for testing.  Please recollect.   RESULTS CALLED TO A. PEREZ,RN [id number] @ [place] Performed at [facility], [place] [address] [place], [place] [address]    Report Status 06/22/2018 FINAL  Final         Radiology Studies: Dg Abd 1 View  Result Date: 06/23/2018 CLINICAL DATA:  Follow-up small bowel obstruction EXAM: ABDOMEN - 1 VIEW COMPARISON:  06/17/2018 FINDINGS: Gaseous distention of bowel again noted, predominantly small bowel compatible with small-bowel obstruction. Overall, gaseous distention of small bowel loops may over rest slightly since prior study. Interval removal of NG tube. No free air or organomegaly. IMPRESSION: Gaseous distention of small bowel compatible with small-bowel obstruction, progressed since prior study. Interval removal of NG tube. Electronically Signed   By: Rolm Baptise M.D.   On: 06/23/2018 13:05   Ct Angio Chest Pe W Or Wo Contrast  Addendum Date: 06/21/2018   ADDENDUM REPORT: 06/21/2018 23:33 ADDENDUM: The findings of persistent high-grade small bowel obstruction, lung findings, and negative PE were discussed by telephone with Dr. Armandina Gemma on 06/21/2018 at 2328 hours. Electronically Signed   By: Genevie Ann M.D.   On: 06/21/2018 23:33   Result Date: 06/21/2018 CLINICAL DATA:  68 year old male status post incarcerated left inguinal hernia related bowel obstruction treated surgically on 06/18/2018. Postoperative hypoxia. 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 Developed a skin wound over the medial aspect of the left 1st metatarsal about 2 weeks ago. Non-small cell lung cancer status post SBRT to the left lung. EXAM: CT ANGIOGRAPHY CHEST WITH CONTRAST TECHNIQUE: Multidetector CT imaging of the chest was performed using the standard protocol during bolus administration of intravenous contrast. Multiplanar CT image reconstructions and MIPs  were obtained to evaluate the vascular anatomy. CONTRAST:  138mL ISOVUE-370 IOPAMIDOL (ISOVUE-370) INJECTION 76% COMPARISON:  PET-CT 10/19/2017 and earlier.  FINDINGS: Cardiovascular: Good contrast bolus timing in the pulmonary arterial tree and the thoracic aorta. No focal filling defect identified in the pulmonary arteries to suggest acute pulmonary embolism. Calcified and soft thoracic aortic atherosclerosis. No thoracic aortic aneurysm or dissection. Calcified coronary artery atherosclerosis. Cardiac size at the upper limits of normal. No pericardial effusion. Mediastinum/Nodes: No mediastinal lymphadenopathy. Lungs/Pleura: Small to moderate layering bilateral pleural effusions, slightly greater on the right. Superimposed enhancing bilateral middle lobe and lower lobe atelectasis. Underlying centrilobular emphysema. Focal peribronchial opacity in the right upper lobe on series 6, image 41 is new since 2019 and appears inflammatory. Similar patchy opacity in the right lower lobe superior segment. Similar patchy inflammatory appearing opacity in the bilateral lower lobes proximal to the basilar segment atelectasis (series 6, image 93 on the right). Chronic architectural distortion along the left lateral major fissure has not significantly changed. Mild apical septal thickening. Stable to smaller lateral right upper lobe lung nodule on series 6, image 25. There are layering secretions in the trachea and right mainstem bronchus. Upper Abdomen: Abdominal viscera are described on the CTA abdomen and pelvis runoff today reported separately. Musculoskeletal: A subacute appearing left posterolateral 7th rib fracture is new since May 2019 with some periosteal new bone present. No superimposed acute or suspicious osseous abnormality in the chest. Review of the MIP images confirms the above findings. IMPRESSION: 1. No evidence of acute pulmonary embolus. 2. Thoracic aortic atherosclerosis with no associated dissection or aortic aneurysm in the chest. CTA Abdomen/Pelvis Runoff today is reported separately. 3. Emphysema (ICD10-J43.9) with scattered bilateral bronchopneumonia and  small to moderate bilateral layering pleural effusions with atelectasis. Electronically Signed: By: Genevie Ann M.D. On: 06/21/2018 22:16   Ct Angio Ao+bifem W & Or Wo Contrast  Addendum Date: 06/21/2018   ADDENDUM REPORT: 06/21/2018 23:33 ADDENDUM: The findings of persistent high-grade small bowel obstruction, lung findings, and negative PE were discussed by telephone with Dr. Armandina Gemma on 06/21/2018 at 2328 hours. Electronically Signed   By: Genevie Ann M.D.   On: 06/21/2018 23:33   Result Date: 06/21/2018 CLINICAL DATA:  68 year old male status post incarcerated left inguinal hernia related bowel obstruction treated surgically on 06/18/2018. Postoperative hypoxia. 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 Developed a skin wound over the medial aspect of the left 1st metatarsal about 2 weeks ago. Non-small cell lung cancer status post SBRT to the left lung. EXAM: CT ANGIOGRAPHY OF ABDOMINAL AORTA WITH ILIOFEMORAL RUNOFF TECHNIQUE: Multidetector CT imaging of the abdomen, pelvis and lower extremities was performed using the standard protocol during bolus administration of intravenous contrast. Multiplanar CT image reconstructions and MIPs were obtained to evaluate the vascular anatomy. CONTRAST:  145mL ISOVUE-370 IOPAMIDOL (ISOVUE-370) INJECTION 76% COMPARISON:  Chest CTA today reported separately. Preoperative CT Abdomen and Pelvis 06/13/2018. FINDINGS: VASCULAR Aorta: Extensive aortic atherosclerosis. No aortic dissection or aneurysm. Celiac: Patent with no hemodynamically significant stenosis. SMA: Patent with moderate proximal stenosis as seen on series 4, image 101. Renals: Patent with calcified plaque greater on the right. Moderate to severe proximal right main renal artery stenosis (series 4, image 106). IMA: Patent with atherosclerosis at its origin. RIGHT Lower Extremity The right iliac arteries are atherosclerotic but patent. There is a patent right  external iliac artery stent with no in stent stenosis identified. There is moderate stenosis at  the origin of the right internal iliac artery on series 4, image 142. The right common femoral artery is patent with soft and calcified plaque. The right SFA is occluded at its origin and only reconstituted distally just proximal to the popliteal on series 4, image 247. The right PFA remains patent. The popliteal is patent with soft and calcified plaque. Runoff: Attenuated 3 vessel runoff. LEFT Lower Extremity Occluded left common and external iliac arteries. Occluded proximal left internal iliac artery which is reconstituted distally. Reconstituted left common femoral artery in part from the left inferior epigastric. Patent left femoral artery bifurcation with occluded left SFA about 5 centimeters distal to the bifurcation, reconstituted distally at the popliteal. The left profundus femoral artery remains patent. The left popliteal artery is patent with soft and calcified plaque. Runoff: Attenuated 3 vessel runoff, worse when compared to the right side. See series 4, image 375. Review of the MIP images confirms the above findings. NON-VASCULAR Lower chest: Reported separately today. Hepatobiliary: Negative liver and gallbladder. Pancreas: Chronic calcific pancreatitis is stable. Spleen: Diminutive, negative. Adrenals/Urinary Tract: Adrenal glands and kidneys are stable and within normal limits. Unremarkable urinary bladder. Stomach/Bowel: Interval repair of the left inguinal hernia, however, a high-grade small bowel obstruction persists with transition point in the left lower quadrant as seen on series 7, image 46 and series 4, image 147. Small bowel loops remain dilated up to 45-50 millimeters diameter there is a small right inguinal hernia which appears to contain only fluid as before. There is a small volume of gas and stool in the right colon to the hepatic flexure. The remainder of the transverse, the descending and  rectosigmoid colon remain completely decompressed. Decompressed stomach and duodenum. Small volume free fluid. No definite pneumoperitoneum. Lymphatic: No lymphadenopathy. Reproductive: Interval left inguinal hernia repair with no adverse features. Small fluid only containing right inguinal hernia appears stable. Other: Small volume pelvic free fluid with simple fluid density. Musculoskeletal: No acute osseous abnormality identified. Previous distal left tib fib ORIF. IMPRESSION: NON-VASCULAR 1. Persistent High-grade Small Bowel Obstruction despite satisfactory appearance of the recent left inguinal hernia repair. Transition point remains in the left lower quadrant as seen on series 4, image 147 and coronal series 7, image 146. Trace free fluid. No free air identified. 2. Chest CTA today is reported separately. VASCULAR 1. Aortic Atherosclerosis (ICD10-I70.0), negative for dissection or aneurysm. 2. Occluded Left Iliac Arteries, the internal is reconstituted distally. Patent Left Common Femoral artery, reconstituted in part from the inferior epigastric. Patent Right Iliac Arteries, patent right external iliac stent. Occluded bilateral SFAs, both reconstituted distally near the popliteal junctions. 3. Patent but attenuated bilateral three-vessel runoff, worse on the left. 4. Moderate or severe stenosis at the Right Renal Artery origin, Right Internal Iliac Artery origin. Electronically Signed: By: Genevie Ann M.D. On: 06/21/2018 22:52   [place] Ekg Site Rite  Result Date: 06/22/2018 If Site Rite image not attached, placement could not be confirmed due to current cardiac rhythm.       Scheduled Meds: . budesonide (PULMICORT) nebulizer solution  0.5 mg Nebulization BID  . enoxaparin (LOVENOX) injection  30 mg Subcutaneous Q24H  . feeding supplement (ENSURE ENLIVE)  237 mL Oral TID BM  . folic acid  1 mg Intravenous Daily  . hydrocortisone-pramoxine   Rectal QID  . insulin aspart  0-15 Units Subcutaneous Q6H  .  ipratropium-albuterol  3 mL Nebulization Q6H  . mouth rinse  15 mL Mouth Rinse BID  . metoprolol tartrate  2.5 mg Intravenous Q8H  . thiamine injection  100 mg Intravenous Daily   Continuous Infusions: . azithromycin 500 mg (06/23/18 1445)  . cefTRIAXone (ROCEPHIN)  IV 1 g (06/23/18 1307)  . methocarbamol (ROBAXIN) IV 500 mg (06/16/18 2304)  . TPN ADULT (ION) 40 mL/hr at 06/22/18 1713  . TPN ADULT (ION)       LOS: 9 days    Time spent: 35 minutes.     Elmarie Shiley, MD [facility]  If 7PM-7AM, please contact night-coverage [email or web address] Password [facility] 06/23/2018, 5:33 PM

## 2018-06-23 NOTE — Progress Notes (Signed)
Physical Therapy Treatment Patient Details Name: [name] MRN: [id number] DOB: Mar 19, 1951 Today's Date: 06/23/2018    History of Present Illness Patient has poor pulmonary reserve due to advanced COPD, continued smoker, now s/p inguinal hernia repair, had increased confusion post op, at risk of decompensation, was transferred to stepdown unit on 1/10; pt now on medical floor, had a fall this morning reportedly trying to get to bathroom;    PT Comments    Pt in bed on 4 lts.  Assisted OOB to amb to bathroom, assisted in bathroom then amb an increased distance in hallway.  General Gait Details: tolerated an increased distance.  Remained on 4 lts nasal.  Begining sats 99% at rest then decreased to 84% with 2/4 dyspnea and increased coughing.  Assisted back to bed.    Follow Up Recommendations  Home health PT     Equipment Recommendations  Rolling walker with 5" wheels;3in1 (PT);Other (comment)(oxygen)    Recommendations for Other Services       Precautions / Restrictions Precautions Precautions: Fall Precaution Comments: O2,monitor sats Restrictions Weight Bearing Restrictions: No    Mobility  Bed Mobility Overal bed mobility: Needs Assistance Bed Mobility: Supine to Sit;Sit to Supine     Supine to sit: Supervision Sit to supine: Supervision   General bed mobility comments: pt able to self perform.  safety with IV line only   Transfers Overall transfer level: Needs assistance Equipment used: Rolling walker (2 wheeled) Transfers: Sit to/from [facility] Sit to Stand: Supervision;Min guard Stand pivot transfers: Supervision;Min guard       General transfer comment: cues for safety and hand placement using walker   Ambulation/Gait Ambulation/Gait assistance: Supervision;Min guard Gait Distance (Feet): 75 Feet Assistive device: Rolling walker (2 wheeled) Gait Pattern/deviations: Step-through pattern;Narrow base of support;Trunk flexed;Decreased  stride length Gait velocity: decreased    General Gait Details: tolerated an increased distance.  Remained on 4 lts nasal.  Begining sats 99% at rest then decreased to 84% with 2/4 dyspnea and increased coughing.     Stairs             Wheelchair Mobility    Modified Rankin (Stroke Patients Only)       Balance                                            Cognition Arousal/Alertness: Awake/alert Behavior During Therapy: WFL for tasks assessed/performed                                   General Comments: impulsive but otherwise pleasant and following all directions       Exercises      General Comments        Pertinent Vitals/Pain Pain Assessment: Faces Faces Pain Scale: Hurts a little bit Pain Location: ABD recent surgery Pain Descriptors / Indicators: Aching;Tightness Pain Intervention(s): Monitored during session    Home Living                      Prior Function            PT Goals (current goals can now be found in the care plan section) Progress towards PT goals: Progressing toward goals    Frequency    Min 3X/week      PT  Plan Current plan remains appropriate    Co-evaluation              AM-PAC PT "6 Clicks" Mobility   Outcome Measure  Help needed turning from your back to your side while in a flat bed without using bedrails?: A Little Help needed moving from lying on your back to sitting on the side of a flat bed without using bedrails?: A Little Help needed moving to and from a bed to a chair (including a wheelchair)?: A Little Help needed standing up from a chair using your arms (e.g., wheelchair or bedside chair)?: A Little Help needed to walk in hospital room?: A Little Help needed climbing 3-5 steps with a railing? : A Lot 6 Click Score: 17    End of Session Equipment Utilized During Treatment: Gait belt Activity Tolerance: Patient tolerated treatment well Patient left: in  bed;with call bell/phone within reach;with bed alarm set;with family/visitor present   PT Visit Diagnosis: Unsteadiness on feet (R26.81);History of falling (Z91.81)     Time: 0165-5374 PT Time Calculation (min) (ACUTE ONLY): 25 min  Charges:  $Gait Training: 8-22 mins $Therapeutic Activity: 8-22 mins                     Rica Koyanagi  PTA Acute  Rehabilitation Services Pager      [phone] Office      [phone]

## 2018-06-23 NOTE — Progress Notes (Signed)
Nutrition Follow-up  DOCUMENTATION CODES:   Underweight, Severe malnutrition in context of chronic illness  INTERVENTION:   Monitor magnesium, potassium, and phosphorus daily for at least 3 days, MD to replete as needed, as pt is at risk for refeeding syndrome given severe malnutrition and poor PO intakes for at least 10 days now.  TPN per Pharmacy  NUTRITION DIAGNOSIS:   Severe Malnutrition related to chronic illness(COPD) as evidenced by energy intake < or equal to 75% for > or equal to 1 month, severe fat depletion, severe muscle depletion, percent weight loss.  Ongoing.   GOAL:   Patient will meet greater than or equal to 90% of their needs  Progressing with TPN.  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 Presents this admission with SBO due to hernia.   1/10- open inguinal hernia repair  1/12- clear liquid diet 1/13- full liquid diet 1/14 -made NPO, started on TPN, PICC place  Currently receiving TPN at 40 ml/hr (providing 969 kcal and 48g protein). At 1800 today, TPN will advance to 65 ml/hr (providing 1575 kcal and 78g protein).  Pt's weight decreased to 83 lb since 1/10 (weighed 89 lb then).   Medications: IV Folic acid, IV Thiamine Labs reviewed: CBGs: 131-194 Mg/Phos WNL  Diet Order:   Diet Order            Diet NPO time specified  Diet effective now              EDUCATION NEEDS:   Education needs have been addressed  Skin:  Skin Assessment: Reviewed RN Assessment  Last BM:  1/15  Height:   Ht Readings from Last 1 Encounters:  06/18/18 5\' 2"  (1.575 m)    Weight:   Wt Readings from Last 1 Encounters:  06/22/18 38 kg    Ideal Body Weight:  53.6 kg  BMI:   Body mass index is 15.32 kg/m.  Estimated Nutritional Needs:   Kcal:  1450-1650 kcal  Protein:  70-85 grams  Fluid:  >/= 1.4 L/day  [name], MS, RD, LDN [place] [facility] Pager: [phone] After Hours Pager: [phone]

## 2018-06-23 NOTE — Progress Notes (Signed)
NAME:  [name], MRN:  [id number], DOB:  1951/02/17, LOS: 9 ADMISSION DATE:  06/13/2018, CONSULTATION DATE:  06/18/18 REFERRING MD:  Ernst Spell CHIEF COMPLAINT:  Hypoxia, agitation   Brief History   68 yo M s/p inguinal hernia repair 1/10, after presenting 1/5 with abdominal pain, bilateral inguinal hernias with small bowel obstruction. Underwent hernia repair 1/10. Post operatively patient is agitated and had SpO2 high 80s to low 90s; PCCM asked to consult for hypoxia.  Past Medical History  COPD (FEV1 49% in 2016)  HTN PVD NSCLC EtOH abuse Recurrent inguinal hernia  Significant Hospital Events   1/5 Admission 1/10 L inguinal hernia repair. Transfer to SDU for hypoxia post-operative 1/11 mild sedation from ativan, on 5L  1/15 Confusion overnight > started on steroids 1/14, started on TNA  Consults:  Surgery PCCM  Procedures:  1/10 L inguinal hernia repair  Significant Diagnostic Tests:  1/5 CT a/p >> bilatera hiatal hernia, distal small bowel obstruction. Transition point located within L inguinal hernia. R hiatal hernia containing ascites  1/10 CXR >> RLL opacity  Micro Data:  1/10 MRSA >> neg  Antimicrobials:    Interim history/subjective:  Noted confusion overnight, started on steroids 1/14. Afebrile.  Pt asking for ice chips. PO intake stopped with concern for SBO on CT.   Objective   Blood pressure (!) 161/87, pulse 78, temperature 98.3 F (36.8 C), temperature source Oral, resp. rate 15, height 5\' 2"  (1.575 m), weight 38 kg, SpO2 92 %.        Intake/Output Summary (Last 24 hours) at 06/23/2018 0937 Last data filed at 06/23/2018 0600 Gross per 24 hour  Intake 959.17 ml  Output 1225 ml  Net -265.83 ml   Filed Weights   06/18/18 0924 06/18/18 1437 06/22/18 0845  Weight: 38.1 kg 40.5 kg 38 kg   Examination: General: cachectic male sitting in chair in NAD HEENT: MM pink/moist, no jvd Neuro: Awake, alert, mild confusion, pleasant, MAE  CV: s1s2 rrr, no  m/r/g PULM: even/non-labored, prolonged exp phase, lungs bilaterally diminished, weak moist cough but no sputum production  GI: soft, non-tender, bsx4 hypoactive  Extremities: warm/dry, no edema  Skin: no rashes or lesions  Resolved Hospital Problem list   SBO   Assessment & Plan:   COPD Postop hypoxia, likely secondary to atelectasis, chest congestion with poor mucociliary clearance and underlying lung disease.  No PFT's on file to define extent of lung disease.  Does not appear to be in active COPD exacerbation P: Stop steroids given no evidence of acute exacerbation  Wean O2 for sats 88-92% Continue IPPB with nebs Needs aggressive pulmonary hygiene - mobilize, flutter/IS, coughing  Mucinex BID  Continue Pulmicort + Duoneb Smoking cessation counseling  Will need ambulatory O2 assessment closer to discharge Follow up with pulmonary as outpatient for COPD assessment   Bilateral, R>L Pleural Effusions  -improved with diuresis  P: Follow up CXR intermittently   Post-Operative Confusion - initially improved, worse with steroids In setting of CNS depressing medications, COPD P: Minimize sedation / pain medications  Mobilize / OOB   HTN P: Per primary   Rest per primary    Best practice:  Diet: NPO, sips with meds and ice chips  Pain/Anxiety/Delirium protocol (if indicated): tylenol, dilaudid VAP protocol (if indicated): n/a DVT prophylaxis: lovenox GI prophylaxis: pepcid Glucose control: n/a Mobility: OOB Code Status: full Family Communication: No family at bedside 1/15.   Disposition: Medical floor, TRH  Labs   CBC: Recent Labs  Lab 06/18/18 0441  06/19/18 0316 06/20/18 0302 06/21/18 0427 06/22/18 0520 06/23/18 0501  WBC 4.4   < > 7.0 9.9 14.7* 15.5* 9.1  NEUTROABS 3.2  --   --   --   --   --  8.1*  HGB 11.5*   < > 9.2* 9.3* 11.5* 11.5* 10.0*  HCT 34.3*   < > 27.3* 28.1* 34.3* 35.1* 30.3*  MCV 94.8   < > 96.1 96.9 96.3 94.9 96.5  PLT 252   < > 210  238 300 349 338   < > = values in this interval not displayed.    Basic Metabolic Panel: Recent Labs  Lab 06/18/18 0441  06/19/18 0316 06/20/18 0302 06/21/18 0427 06/22/18 0520 06/23/18 0501  NA 132*  --  132* 132* 132* 136 136  K 5.1  --  3.9 3.7 3.3* 3.3* 3.9  CL 94*  --  97* 94* 88* 92* 93*  CO2 27  --  26 28 34* 35* 34*  GLUCOSE 95  --  201* 107* 68* 164* 182*  BUN 31*  --  27* 19 15 10 16   CREATININE 1.08   < > 0.88 0.72 0.68 0.62 0.56*  CALCIUM 8.9  --  7.9* 8.4* 8.8* 8.6* 8.5*  MG 1.8  --   --  1.3* 1.8 1.6* 2.0  PHOS  --   --   --   --   --  2.2* 2.5   < > = values in this interval not displayed.   GFR: Estimated Creatinine Clearance: 48.8 mL/min (A) (by C-G formula based on SCr of 0.56 mg/dL (L)). Recent Labs  Lab 06/19/18 1954 06/20/18 0302 06/21/18 0427 06/22/18 0520 06/23/18 0501  WBC  --  9.9 14.7* 15.5* 9.1  LATICACIDVEN 1.5  --   --   --   --     Liver Function Tests: Recent Labs  Lab 06/22/18 0520 06/23/18 0501  AST 32 29  ALT 20 21  ALKPHOS 57 56  BILITOT 0.8 0.5  PROT 6.1* 5.8*  ALBUMIN 3.0* 2.7*   No results for input(s): LIPASE, AMYLASE in the last 168 hours. No results for input(s): AMMONIA in the last 168 hours.  ABG    Component Value Date/Time   PHART 7.444 06/19/2018 1844   PCO2ART 45.3 06/19/2018 1844   PO2ART 74.5 (L) 06/19/2018 1844   HCO3 30.5 (H) 06/19/2018 1844   O2SAT 94.5 06/19/2018 1844     Coagulation Profile: No results for input(s): INR, PROTIME in the last 168 hours.  Cardiac Enzymes: No results for input(s): CKTOTAL, CKMB, CKMBINDEX, TROPONINI in the last 168 hours.  HbA1C: No results found for: HGBA1C  CBG: Recent Labs  Lab 06/18/18 1712 06/22/18 2351 06/23/18 0505  GLUCAP 115* 211* 194*    Noe Gens, NP-C [facility] Pgr: [phone] or if no answer [phone] 06/23/2018, 9:37 AM

## 2018-06-23 NOTE — Progress Notes (Signed)
PHARMACY - ADULT TOTAL PARENTERAL NUTRITION CONSULT NOTE   Pharmacy Consult for TPN Indication: NPO for incarcerated bowel; too malnourished for surgery  Patient Measurements: Height: 5\' 2"  (157.5 cm) Weight: 83 lb 12.4 oz (38 kg) IBW/kg (Calculated) : 54.6 TPN AdjBW (KG): 40.5 Body mass index is 15.32 kg/m. 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 Presents this admission with SBO due to hernia, now s/p open inguinal hernia repair but CT still shows persistent SBO separate from hernia repair site - likely d/t edema/congestion from prior incarceration.  Central access:  PICC placed 1/14 TPN start date: 1/14   Significant events:   Insulin Requirements: 5 units in the past 24 hours  Current Nutrition: NPO  IVF: none  Today, 06/23/2018:  Glucose (CBG goal 100-150) - 194  Electrolytes - K, Mg, Cl, Phos low; Na, Ca WNL  Renal - SCr stable WNL; bicarb elevated; BUN improved to WNL  LFTs - WNL  TGs - 103  Prealbumin - moderately low (11.3)  NUTRITIONAL GOALS                                                                                             RD recs: (1/13) Kcal:  1450-1650 kcal Protein:  70-85 grams Fluid:  >/= 1.4 L/day  PLAN                                                                                                                          At 1800 today:  Increase TPN to 65 ml/hr (goal)  TPN at goal rate of 65 ml/hr provides 78 g of protein, 234 g of dextrose, and 47 g of lipids for a total of 1576 kcals meeting 100% of patient needs  Will add MVI and trace elements as patient is NPO  Electrolytes in TPN: standard concentration; Cl:Ac = max  Cl  Change SSI to moderate q6 hr CBG  checks  TPN lab panels on Mondays & Thursdays  F/u daily  Dolly Rias RPh 06/23/2018, 10:14 AM Pager [phone]

## 2018-06-24 DIAGNOSIS — J431 Panlobular emphysema: Secondary | ICD-10-CM

## 2018-06-24 LAB — GLUCOSE, CAPILLARY
GLUCOSE-CAPILLARY: 147 mg/dL — AB (ref 70–99)
GLUCOSE-CAPILLARY: 163 mg/dL — AB (ref 70–99)
Glucose-Capillary: 236 mg/dL — ABNORMAL HIGH (ref 70–99)
Glucose-Capillary: 115 mg/dL — ABNORMAL HIGH (ref 70–99)
Glucose-Capillary: 161 mg/dL — ABNORMAL HIGH (ref 70–99)

## 2018-06-24 LAB — CULTURE, BLOOD (ROUTINE X 2)
Culture: NO GROWTH
Culture: NO GROWTH
Special Requests: ADEQUATE
Special Requests: ADEQUATE

## 2018-06-24 LAB — PHOSPHORUS: Phosphorus: 2.8 mg/dL (ref 2.5–4.6)

## 2018-06-24 LAB — COMPREHENSIVE METABOLIC PANEL
ALT: 31 U/L (ref 0–44)
AST: 46 U/L — ABNORMAL HIGH (ref 15–41)
Albumin: 2.9 g/dL — ABNORMAL LOW (ref 3.5–5.0)
Alkaline Phosphatase: 54 U/L (ref 38–126)
Anion gap: 6 (ref 5–15)
BUN: 25 mg/dL — ABNORMAL HIGH (ref 8–23)
CO2: 32 mmol/L (ref 22–32)
Calcium: 8.6 mg/dL — ABNORMAL LOW (ref 8.9–10.3)
Chloride: 95 mmol/L — ABNORMAL LOW (ref 98–111)
Creatinine, Ser: 0.57 mg/dL — ABNORMAL LOW (ref 0.61–1.24)
GFR calc Af Amer: >60 mL/min (ref >60)
GFR calc non Af Amer: >60 mL/min (ref >60)
GLUCOSE: 193 mg/dL — AB (ref 70–99)
Potassium: 3.7 mmol/L (ref 3.5–5.1)
Sodium: 133 mmol/L — ABNORMAL LOW (ref 135–145)
Total Bilirubin: 0.6 mg/dL (ref 0.3–1.2)
Total Protein: 5.8 g/dL — ABNORMAL LOW (ref 6.5–8.1)

## 2018-06-24 LAB — MAGNESIUM: Magnesium: 1.6 mg/dL — ABNORMAL LOW (ref 1.7–2.4)

## 2018-06-24 MED ORDER — INSULIN ASPART 100 UNIT/ML ~~LOC~~ SOLN
0.0000 [IU] | SUBCUTANEOUS | Status: DC
  Administered 2018-06-24 – 2018-06-25 (×3): 3 [IU] via SUBCUTANEOUS

## 2018-06-24 MED ORDER — MAGNESIUM SULFATE 2 GM/50ML IV SOLN
2.0000 g | Freq: Once | INTRAVENOUS | Status: AC
  Administered 2018-06-24: 2 g via INTRAVENOUS
  Filled 2018-06-24: qty 50

## 2018-06-24 MED ORDER — TRAVASOL 10 % IV SOLN
INTRAVENOUS | Status: AC
  Administered 2018-06-24: 18:00:00 via INTRAVENOUS
  Filled 2018-06-24: qty 842.4

## 2018-06-24 NOTE — Progress Notes (Signed)
Occupational Therapy Treatment Patient Details Name: [name] MRN: [id number] DOB: April 25, 1951 Today's Date: 06/24/2018    History of present illness Patient has poor pulmonary reserve due to advanced COPD, continued smoker, now s/p inguinal hernia repair, had increased confusion post op, at risk of decompensation, was transferred to stepdown unit on 1/10; pt now on medical floor, had a fall this morning reportedly trying to get to bathroom;   OT comments  Energy conservation handout provided.   Follow Up Recommendations  SNF;Home health OT;Supervision/Assistance - 24 hour(depending on care at home)    Equipment Recommendations  None recommended by OT    Recommendations for Other Services      Precautions / Restrictions Precautions Precautions: Fall Precaution Comments: O2,monitor sats       Mobility Bed Mobility Overal bed mobility: Needs Assistance Bed Mobility: Supine to Sit;Sit to Supine     Supine to sit: Supervision Sit to supine: Supervision   General bed mobility comments: pt able to self perform.  safety with IV line only   Transfers Overall transfer level: Needs assistance Equipment used: Rolling walker (2 wheeled) Transfers: Sit to/from [facility] Sit to Stand: Supervision;Min guard Stand pivot transfers: Supervision;Min guard       General transfer comment: cues for safety and hand placement using walker     Balance Overall balance assessment: Needs assistance;History of Falls Sitting-balance support: No upper extremity supported;Feet supported Sitting balance-Leahy Scale: Fair       Standing balance-Leahy Scale: Poor Standing balance comment: reliant on UEs                           ADL either performed or assessed with clinical judgement   ADL Overall ADL's : Needs assistance/impaired Eating/Feeding: Set up;Sitting   Grooming: Standing;Min guard                                 General ADL  Comments: Energy conservation handout provided and gone over. Pt verbalized understanding and wants to continue to read.  Pt very agreeable to OOB     Vision Patient Visual Report: No change from baseline     Perception         Cognition Arousal/Alertness: Awake/alert Behavior During Therapy: WFL for tasks assessed/performed Overall Cognitive Status: No family/caregiver present to determine baseline cognitive functioning                         Following Commands: Follows one step commands consistently       General Comments: impulsive but otherwise pleasant and following all directions                    Pertinent Vitals/ Pain       Pain Assessment: No/denies pain     Prior Functioning/Environment              Frequency  Min 2X/week        Progress Toward Goals  OT Goals(current goals can now be found in the care plan section)  Progress towards OT goals: Progressing toward goals     Plan Discharge plan remains appropriate       AM-PAC OT "6 Clicks" Daily Activity     Outcome Measure   Help from another person eating meals?: A Little Help from another person taking care of personal grooming?: A Little Help from  another person toileting, which includes using toliet, bedpan, or urinal?: A Little Help from another person bathing (including washing, rinsing, drying)?: A Little Help from another person to put on and taking off regular upper body clothing?: A Little Help from another person to put on and taking off regular lower body clothing?: A Little 6 Click Score: 18    End of Session Equipment Utilized During Treatment: Rolling walker  OT Visit Diagnosis: Unsteadiness on feet (R26.81);Other abnormalities of gait and mobility (R26.89);Repeated falls (R29.6);Muscle weakness (generalized) (M62.81)   Activity Tolerance Patient tolerated treatment well   Patient Left with call bell/phone within reach;Other (comment);in chair;with chair alarm  set(tele sitter)   Nurse Communication Mobility status        Time: [phone] OT Time Calculation (min): 14 min  Charges: OT General Charges $OT Visit: 1 Visit OT Treatments $Self Care/Home Management : 8-22 mins  Kari Baars, [place] [phone] Office- [place], Edwena Felty D 06/24/2018, 2:08 PM

## 2018-06-24 NOTE — Progress Notes (Signed)
PHARMACY - ADULT TOTAL PARENTERAL NUTRITION CONSULT NOTE   Pharmacy Consult for TPN Indication: NPO for incarcerated bowel; too malnourished for surgery  Patient Measurements: Height: 5\' 2"  (157.5 cm) Weight: 83 lb 12.4 oz (38 kg) IBW/kg (Calculated) : 54.6 TPN AdjBW (KG): 40.5 Body mass index is 15.32 kg/m. 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 Presents this admission with SBO due to hernia, now s/p open inguinal hernia repair but CT still shows persistent SBO separate from hernia repair site - likely d/t edema/congestion from prior incarceration.  Central access:  PICC placed 1/14 TPN start date: 1/14   Significant events:  1/15: made NPO, chg MV, lytes to IV or in TPN  Insulin Requirements: 5 units in the past 24 hours  Current Nutrition: NPO  IVF: none  Today, 06/24/2018:  Glucose (CBG goal 100-150) - some elevated CBGs on goal rate TPN (range 147-236); note GIR = 4.28, mostly d/t low weight. No low CBGs  Electrolytes - Na, Mg low, others WNL  Renal - SCr stable WNL; bicarb decreased to WNL; BUN had decreased to WNL, now rising again  LFTs - Albumin low, AST increased overnight but still < 50  TGs - WNL  Prealbumin - moderately low (11.3)  NUTRITIONAL GOALS                                                                                             RD recs: (1/13) Kcal:  1450-1650 kcal Protein:  70-85 grams Fluid:  >/= 1.4 L/day  PLAN                                                                                                                         Mg 2g IV x1 per MD; will also increase Mg in TPN  At 1800 today:  Continue TPN at 65 ml/hr  Given high CBGs and GIR on upper end of  range, will max protein to allow for reduction in dextrose  New TPN provides 84 g of protein and 1464 kcals still meeting 100% of patient needs  TPN to contain MVI and trace elements  Electrolytes in TPN: increase Na, K, Mg, others standard concentration; Cl:Ac = max Cl  Continue moderate SSI; will increase to q4 hr CBG checks  TPN lab panels on Mondays & Thursdays; BMP, Mg, Phos tomorrow  Reuel Boom, PharmD, BCPS [phone] 06/24/2018, 8:20 AM

## 2018-06-24 NOTE — Progress Notes (Signed)
PROGRESS NOTE    [name]  GEX:[id number] DOB: [birth year] DOA: 06/13/2018 PCP: [name], MD    Brief Narrative: 68 year old with past medical history significant for COPD not on home oxygen, peripheral vascular disease a status post DES in 03/2018, history of non-small cell lung cancer status post radiation who presents with nausea vomiting found to have SBO from inguinal hernia. Patient was cleared by cardiology for surgery.  Patient underwent left inguinal hernia repair with mesh on June 18, 2018 Dr. Harle Stanford  Assessment & Plan:   Principal Problem:   Incarcerated left inguinal hernia s/p repair 06/18/2018 Active Problems:   Smoker   COPD (chronic obstructive pulmonary disease) ([place])   Essential hypertension   Protein-calorie malnutrition, severe (HCC)   Severe claudication (HCC)   PAD (peripheral artery disease) ([place])   Acute hypoxemic respiratory failure (HCC)   Acute metabolic encephalopathy   SBO (small bowel obstruction) ([place])   Inguinal hernia of left side with obstruction and without gangrene   1-SBO 2/2 left inguinal hernia --On general surgery service, Plavix held for 5 days, he is brought to the OR  On 1/10 and underwent OPEN RECURRENT LEFT INGUINAL HERNIA REPAIR WITH MESH (Left), INSERTION OF MESH (Left) -general surgery oked to Resume plavix  -he is started on TPN on 1/14 due to persistent ileus, hold all oral meds --plan per general surgery Per CT angios on January 13 there is persistent high-grade small bowel obstruction KUB 1-15 persistent dilation SBO. Continue with NPO.   Postop hypoxia  -Patient has poor pulmonary reserve due to advanced COPD, continued smoker,  status post abdomen surgery, with increased confusion, at risk of decompensation, he is transferred to stepdown unit on 1/10 -stat chest x-ray obtained on 1/10 showed "Small new bilateral pleural effusions with probable atelectasis at the left base" he received lasix 20mg  iv x1 on 1/10 and  20mg  iv x1 on 1/11 with good diruesis, he received another dose of lasix 40mg  on 1/14. Will likely need lasix prn while on TPN. -critical care consulted, input appreciated --persistent hypoxia, girlfriends reports this is new,  CTA chest no PE, does show mucus plugging and infiltrates, he start to have diffuse wheezing on 1/14, will treat as COPD exacerbation, start steroids, increase nebs, chest PT, start rocephin/zithro , plan for 5 days abx treatment. Discussed with CCM will try to taper off IV steroid. Will further decrease dose tomorrow.  Respiratory  toilet.  Acute metabolic encephalopathy/delirium on 1/10: Likely multifactorial , alcohol withdrawal? Hyponatremia, surgery with anesthesia, inhospital delirium -per family patient has h/o confusion in the past during hospitalization -ct head no acute findings, lactic acid wnl, blood culture no growth - avoid dilaudid ,ativan , d/c tele -low dose zyprexa started on 1/12, girlfriend states it made him more agitated.  Avoid Zyprexa   H/o internal hemorrhoids -Seen by Dr. Thana Farr in April 2019, s/p rubber band ligation  -Blood per rectum this morning, denies abdominal pain -Defer to general surgery  Hyponatremia: Continue with IV fluids.   Hypokalemia:   resolved/.   Hypomagnesemia: replete with IV magnesium  COPD/heavy smoker -Not on home O2 -Likely will benefit O2 at discharge ( per RN prior to surgery patient o2 dropped to the 70's on room air  while Ambulating with one person  Assist to the bathroom) -need outpatient follow up for COPD  Left upper lobe lung cancer  Lung nodules x2 left upper lobe and 1 left lower lobe s/p XRT -Case discussed with radiation oncology Dr. Tammi Klippel who  stated patient has stage Ia status post curative intent treatment XRT finished in July 2019 -Per Dr. Tammi Klippel patient missed his restaging scan, Dr. Tammi Klippel recommended CT chest with IV contrast prior to discharge which is done on 1/13 -pa and  follow with him every 24-month.   PAD s/p stent -PAD s/pSuccessful self expanding Eluvia 7.0 X 60 mm stent placement Rt external iliac arteryon 03/30/2018 by cardiology Dr Virgina Jock,  -Has an appointment with vascular surgery Dr Donnetta Hutching on 2/11 -Girl friend reports patient has an appointment with Dr Donnetta Hutching on 1/6 however patient was admitted to the hospital Girl friend reports patient has increase pain on left leg while walking, she request vascular being consulted while patient is in the hospital, will contact vascular surgery. -vascular surgery Dr Scot Dock input appreciated, patient underwent CT angiogram, per Dr Scot Dock think patient likely will need bypass surgery, but do not think he could tolerate major vascular surgery given his respiratory status, severely debilitated state, and severe protein calorie malnutrition., recommend outpatient follow up with him.  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 Nutrition consult on TPN/       Nutrition Problem: Severe Malnutrition Etiology: chronic illness(COPD)    Signs/Symptoms: energy intake < or equal to 75% for > or equal to 1 month, severe fat depletion, severe muscle depletion, percent weight loss    Interventions: MVI, [employer] Instant Breakfast  Estimated body mass index is 15.32 kg/m as calculated from the following:   Height as of this encounter: 5\' 2"  (1.575 m).   Weight as of this encounter: 38 kg.   DVT prophylaxis: Lovenox Code Status: Full code Family Communication; care discussed with patient Disposition Plan: Patient with persistent anemia, requiring IV TPN.  Consultants:   General surgery as  primary  hospitalist as consultant  Cardiology as consultant  Pulmonary/critical care   Procedures:  Ng placement and removal  on 1/10 s/p OPEN RECURRENT LEFT INGUINAL HERNIA REPAIR WITH MESH (Left), INSERTION OF MESH (Left) TPN    Antimicrobials:   Perioperative  Rocephin/zithro from 1/14 , plan for 5 days   Subjective: Sitting recliner, had BM, denies abdominal pain  Objective: Vitals:   06/23/18 2256 06/24/18 0514 06/24/18 0833 06/24/18 1341  BP: (!) 153/86 (!) 163/91  (!) 150/94  Pulse: 70 73  75  Resp:  16  14  Temp:  98.3 F (36.8 C)  97.6 F (36.4 C)  TempSrc:  Oral  Oral  SpO2:  100% 98% 100%  Weight:      Height:        Intake/Output Summary (Last 24 hours) at 06/24/2018 1452 Last data filed at 06/24/2018 1400 Gross per 24 hour  Intake 1244.65 ml  Output 1177 ml  Net 67.65 ml   Filed Weights   06/18/18 0924 06/18/18 1437 06/22/18 0845  Weight: 38.1 kg 40.5 kg 38 kg    Examination:  General exam: Cachetic, NAD Respiratory system: Bilateral ronchus/. Cardiovascular system: S 1, S 2 rrr Gastrointestinal system: Abdomen is soft, distended, incision and inguinal area clean and healing Central nervous system; alert, confuse Extremities:symmetric power  Skin: No rashes, lesions or ulcers    Data Reviewed: I have personally reviewed following labs and imaging studies  CBC: Recent Labs  Lab 06/18/18 0441  06/19/18 0316 06/20/18 0302 06/21/18 0427 06/22/18 0520 06/23/18 0501  WBC 4.4   < > 7.0 9.9 14.7* 15.5* 9.1  NEUTROABS 3.2  --   --   --   --   --  8.1*  HGB 11.5*   < > 9.2* 9.3* 11.5* 11.5* 10.0*  HCT 34.3*   < > 27.3* 28.1* 34.3* 35.1* 30.3*  MCV 94.8   < > 96.1 96.9 96.3 94.9 96.5  PLT 252   < > 210 238 300 349 338   < > = values in this interval not displayed.   Basic Metabolic Panel: Recent Labs  Lab 06/20/18 0302 06/21/18 0427 06/22/18 0520 06/23/18 0501 06/24/18 0436  NA 132* 132* 136 136 133*  K 3.7 3.3* 3.3* 3.9  3.7  CL 94* 88* 92* 93* 95*  CO2 28 34* 35* 34* 32  GLUCOSE 107* 68* 164* 182* 193*  BUN 19 15 10 16  25*  CREATININE 0.72 0.68 0.62 0.56* 0.57*  CALCIUM 8.4* 8.8* 8.6* 8.5* 8.6*  MG 1.3* 1.8 1.6* 2.0 1.6*  PHOS  --   --  2.2* 2.5 2.8   GFR: Estimated Creatinine Clearance: 48.8 mL/min (A) (by C-G formula based on SCr of 0.57 mg/dL (L)). Liver Function Tests: Recent Labs  Lab 06/22/18 0520 06/23/18 0501 06/24/18 0436  AST 32 29 46*  ALT 20 21 31   ALKPHOS 57 56 54  BILITOT 0.8 0.5 0.6  PROT 6.1* 5.8* 5.8*  ALBUMIN 3.0* 2.7* 2.9*   No results for input(s): LIPASE, AMYLASE in the last 168 hours. No results for input(s): AMMONIA in the last 168 hours. Coagulation Profile: No results for input(s): INR, PROTIME in the last 168 hours. Cardiac Enzymes: No results for input(s): CKTOTAL, CKMB, CKMBINDEX, TROPONINI in the last 168 hours. BNP (last 3 results) No results for input(s): PROBNP in the last 8760 hours. HbA1C: No results for input(s): HGBA1C in the last 72 hours. CBG: Recent Labs  Lab 06/23/18 1136 06/23/18 1752 06/24/18 0000 06/24/18 0640 06/24/18 1141  GLUCAP 131* 147* 147* 236* 163*   Lipid Profile: Recent Labs    06/23/18 0505  TRIG 103   Thyroid Function Tests: Recent Labs    06/22/18 0520  TSH 3.982   Anemia Panel: No results for input(s): VITAMINB12, FOLATE, FERRITIN, TIBC, IRON, RETICCTPCT in the last 72 hours. Sepsis Labs: Recent Labs  Lab 06/19/18 1954  LATICACIDVEN 1.5    Recent Results (from the past 240 hour(s))  Surgical PCR screen     Status: None   Collection Time: 06/18/18  1:47 AM  Result Value Ref Range Status   MRSA, PCR NEGATIVE NEGATIVE Final   Staphylococcus aureus NEGATIVE NEGATIVE Final    Comment: (NOTE) The Xpert SA Assay (FDA approved for NASAL specimens in patients 52 years of age and older), is one component of a comprehensive surveillance program. It is not intended to diagnose infection nor to guide or monitor  treatment. Performed at [facility], [place] [address], [place], [place] [address]   Culture, blood (routine x 2)     Status: None   Collection Time: 06/19/18  7:54 PM  Result Value Ref Range Status   Specimen Description BLOOD LEFT ANTECUBITAL  Final   Special Requests   Final    BOTTLES DRAWN AEROBIC ONLY Blood Culture adequate volume Performed at [place] [address] [place], [place] [address]    Culture   Final    NO  GROWTH 5 DAYS Performed at [facility], [place] [address], [place], [place] [address]    Report Status 06/24/2018 FINAL  Final  Culture, blood (routine x 2)     Status: None   Collection Time: 06/19/18  7:54 PM  Result Value Ref Range Status   Specimen Description   Final    BLOOD LEFT ARM Performed at [place] [address], [place], [place] [address]    Special Requests   Final    BOTTLES DRAWN AEROBIC ONLY Blood Culture adequate volume Performed at [place] [address] [place], [place] [address]    Culture   Final    NO GROWTH 5 DAYS Performed at [facility], [place] [address], [place], [place] [address]    Report Status 06/24/2018 FINAL  Final  Expectorated sputum assessment w rflx to resp cult     Status: None   Collection Time: 06/22/18  4:50 PM  Result Value Ref Range Status   Specimen Description SPU EXPECTORATED  Final   Special Requests NONE  Final   Sputum evaluation   Final    Sputum specimen not acceptable for testing.  Please recollect.   RESULTS CALLED TO A. PEREZ,RN [id number] @ [place] Performed at [facility], [place] [address] [place],  [address]    Report Status 06/22/2018 FINAL  Final         Radiology Studies: Dg Abd 1 View  Result Date: 06/23/2018 CLINICAL DATA:  Follow-up small bowel obstruction EXAM: ABDOMEN - 1 VIEW COMPARISON:  06/17/2018 FINDINGS: Gaseous distention of bowel again  noted, predominantly small bowel compatible with small-bowel obstruction. Overall, gaseous distention of small bowel loops may over rest slightly since prior study. Interval removal of NG tube. No free air or organomegaly. IMPRESSION: Gaseous distention of small bowel compatible with small-bowel obstruction, progressed since prior study. Interval removal of NG tube. Electronically Signed   By: Rolm Baptise M.D.   On: 06/23/2018 13:05        Scheduled Meds: . budesonide (PULMICORT) nebulizer solution  0.5 mg Nebulization BID  . enoxaparin (LOVENOX) injection  30 mg Subcutaneous Q24H  . feeding supplement (ENSURE ENLIVE)  237 mL Oral TID BM  . folic acid  1 mg Intravenous Daily  . hydrocortisone-pramoxine   Rectal QID  . insulin aspart  0-15 Units Subcutaneous Q4H  . ipratropium-albuterol  3 mL Nebulization Q6H  . mouth rinse  15 mL Mouth Rinse BID  . metoprolol tartrate  2.5 mg Intravenous Q8H  . thiamine injection  100 mg Intravenous Daily   Continuous Infusions: . azithromycin 500 mg (06/24/18 1343)  . cefTRIAXone (ROCEPHIN)  IV 1 g (06/24/18 1224)  . methocarbamol (ROBAXIN) IV 500 mg (06/16/18 2304)  . TPN ADULT (ION) 65 mL/hr at 06/23/18 1803  . TPN ADULT (ION)       LOS: 10 days    Time spent: 35 minutes.     Elmarie Shiley, MD [facility]  If 7PM-7AM, please contact night-coverage [email or web address] Password [email or web address] 06/24/2018, 2:52 PM

## 2018-06-24 NOTE — Progress Notes (Signed)
NAME:  [name], MRN:  [id number], DOB:  1950/12/01, LOS: 60 ADMISSION DATE:  06/13/2018, CONSULTATION DATE:  06/18/18 REFERRING MD:  Ernst Spell CHIEF COMPLAINT:  Hypoxia, agitation   Brief History   68 yo M quit smoking sev weeks pta with COPD GOLD III criteria 2016  s/p inguinal hernia repair 1/10, after presenting 1/5 with abdominal pain, bilateral inguinal hernias with small bowel obstruction. Underwent hernia repair 1/10. Post operatively patient is agitated and had SpO2 high 80s to low 90s; PCCM asked to consult for hypoxia.  Past Medical History  COPD - PFT's  11/17/14 FEV1 1/25 (49 % ) ratio 49  with DLCO  35 % corrects to 43 % for alv volume and classic copd curvature to f/v    HTN PVD NSCLC s/p SBRT to left lung  EtOH abuse Recurrent inguinal hernia  Significant Hospital Events   1/5 Admission 1/10 L inguinal hernia repair. Transfer to SDU for hypoxia post-operative 1/11 mild sedation from ativan, on 5L  1/15 Confusion overnight > started on steroids 1/14, started on TNA  Consults:  Surgery PCCM  Procedures:  1/10 L inguinal hernia repair  Significant Diagnostic Tests:  1/5 CT a/p >> bilatera hiatal hernia, distal small bowel obstruction. Transition point located within L inguinal hernia. R hiatal hernia containing ascites  1/10 CXR >> RLL opacity  Micro Data:  1/10 MRSA >> neg  Antimicrobials:    Interim history/subjective:   had a good night/ still some rattling cough   Objective   Blood pressure (!) 163/91, pulse 73, temperature 98.3 F (36.8 C), temperature source Oral, resp. rate 16, height 5\' 2"  (1.575 m), weight 38 kg, SpO2 98 %. on 2.5 lpm         Intake/Output Summary (Last 24 hours) at 06/24/2018 1007 Last data filed at 06/24/2018 0600 Gross per 24 hour  Intake 1595.3 ml  Output 977 ml  Net 618.3 ml   Filed Weights   06/18/18 0924 06/18/18 1437 06/22/18 0845  Weight: 38.1 kg 40.5 kg 38 kg   Examination:  Pt alert, approp nad @ 30  degrees hob No jvd Oropharynx clear,  mucosa nl Neck supple Lungs with a scattered exp > insp rhonchi bilaterally/ congested sounding cough  RRR no s3 or or sign murmur Abd soft with pos mid insp hoover's     Extr  Wasted muscles/ prob mild/mod clubbing Neuro  Sensorium intact,  no apparent motor deficits   Resolved Hospital Problem list   SBO   Assessment & Plan:   COPD Complicated by acute Postop hypoxia and boderline hypercarbia , likely secondary to atelectasis, chest congestion with poor mucociliary clearance and underlying lung disease.    Rec:  Continue flutter/ mobilization/ nebs but no role for IPPB here  Titrate to 02 sats > 90%  F/u as outpt if desired by PCP  But really the critical issue here is whether he's really willing to maintain off cigs   >>> f/u as inpt prn    Bilateral, R>L Pleural Effusions  -improved with diuresis  Comment: Note that pleural effusion and copd have the same effect on insp muscles/mechanics (both shorten their length prior to inspiration making them weaker with less force reserve) so they are synergistic in causing sob.     Post-Operative Confusion - initially improved, worse with steroids In setting of CNS depressing medications, COPD, improved  1/16 P: Minimize sedation / pain medications  Mobilize / OOB  rx per Primary service  Labs   CBC: Recent Labs  Lab 06/18/18 0441  06/19/18 0316 06/20/18 0302 06/21/18 0427 06/22/18 0520 06/23/18 0501  WBC 4.4   < > 7.0 9.9 14.7* 15.5* 9.1  NEUTROABS 3.2  --   --   --   --   --  8.1*  HGB 11.5*   < > 9.2* 9.3* 11.5* 11.5* 10.0*  HCT 34.3*   < > 27.3* 28.1* 34.3* 35.1* 30.3*  MCV 94.8   < > 96.1 96.9 96.3 94.9 96.5  PLT 252   < > 210 238 300 349 338   < > = values in this interval not displayed.    Basic Metabolic Panel: Recent Labs  Lab 06/20/18 0302 06/21/18 0427 06/22/18 0520 06/23/18 0501 06/24/18 0436  NA 132* 132* 136 136 133*  K 3.7 3.3* 3.3* 3.9 3.7  CL  94* 88* 92* 93* 95*  CO2 28 34* 35* 34* 32  GLUCOSE 107* 68* 164* 182* 193*  BUN 19 15 10 16  25*  CREATININE 0.72 0.68 0.62 0.56* 0.57*  CALCIUM 8.4* 8.8* 8.6* 8.5* 8.6*  MG 1.3* 1.8 1.6* 2.0 1.6*  PHOS  --   --  2.2* 2.5 2.8   GFR: Estimated Creatinine Clearance: 48.8 mL/min (A) (by C-G formula based on SCr of 0.57 mg/dL (L)). Recent Labs  Lab 06/19/18 1954 06/20/18 0302 06/21/18 0427 06/22/18 0520 06/23/18 0501  WBC  --  9.9 14.7* 15.5* 9.1  LATICACIDVEN 1.5  --   --   --   --     Liver Function Tests: Recent Labs  Lab 06/22/18 0520 06/23/18 0501 06/24/18 0436  AST 32 29 46*  ALT 20 21 31   ALKPHOS 57 56 54  BILITOT 0.8 0.5 0.6  PROT 6.1* 5.8* 5.8*  ALBUMIN 3.0* 2.7* 2.9*   No results for input(s): LIPASE, AMYLASE in the last 168 hours. No results for input(s): AMMONIA in the last 168 hours.  ABG    Component Value Date/Time   PHART 7.444 06/19/2018 1844   PCO2ART 45.3 06/19/2018 1844   PO2ART 74.5 (L) 06/19/2018 1844   HCO3 30.5 (H) 06/19/2018 1844   O2SAT 94.5 06/19/2018 1844     Coagulation Profile: No results for input(s): INR, PROTIME in the last 168 hours.  Cardiac Enzymes: No results for input(s): CKTOTAL, CKMB, CKMBINDEX, TROPONINI in the last 168 hours.  HbA1C: No results found for: HGBA1C  CBG: Recent Labs  Lab 06/23/18 0505 06/23/18 1136 06/23/18 1752 06/24/18 0000 06/24/18 0640  GLUCAP 194* 131* 147* 147* 236*

## 2018-06-24 NOTE — Care Management Note (Signed)
Case Management Note  Patient Details  Name: [name] MRN: [id number] Date of Birth: 01-06-1951                     Action/Plan: Pt from home with significant other. This CM discussed dc planning with pt and offered choice for home health services. Pt would like to use [facility]. [facility] rep alerted of referral. Will need orders for HHPT/RN at dc. Pt is currently on 02 here in the hospital and does not use 02 at home. Unsure at this time if will need 02 at dc. Pt also currently on TPN in the hospital. If TPN needed at home will need home TPN orders.   Expected Discharge Date:                  Expected Discharge Plan:     In-House Referral:     Discharge planning Services     Post Acute Care Choice:    Choice offered to:     DME Arranged:    DME Agency:     HH Arranged:    HH Agency:     Status of Service:     If discussed at [employer], dates discussed:    Additional CommentsLynnell Catalan, RN 06/24/2018, 10:41 AM [phone]

## 2018-06-24 NOTE — Progress Notes (Addendum)
Patient ID: [name], male   DOB: December 04, 1950, 68 y.o.   MRN: [id number]    6 Days Post-Op  Subjective: CC: Distension Patient reports he is feeling better this AM. Passing flatus this Am and continues to have some loose stools. Mild abdominal pain & some distension.   Objective: Vital signs in last 24 hours: Temp:  [98.2 F (36.8 C)-98.3 F (36.8 C)] 98.3 F (36.8 C) (01/16 0514) Pulse Rate:  [70-75] 73 (01/16 0514) Resp:  [15-16] 16 (01/16 0514) BP: (147-184)/(73-91) 163/91 (01/16 0514) SpO2:  [95 %-100 %] 98 % (01/16 0833) Last BM Date: 06/24/18  Intake/Output from previous day: 01/15 0701 - 01/16 0700 In: 1595.3 [P.O.:240; I.V.:1151.2; IV Piggyback:204.1] Out: 977 [Urine:975; Stool:2] Intake/Output this shift: No intake/output data recorded.  PE: Gen: alert, cooperative. Mildly confused this morning.  Heart: RRR Lungs: Faint end expiratory wheezing. No rales or rhonchi.  Abd: Soft with distension. Hypoactive BS. No tenderness.  GU: Incision c/d/i. Mild edema.   Lab Results:  Recent Labs    06/22/18 0520 06/23/18 0501  WBC 15.5* 9.1  HGB 11.5* 10.0*  HCT 35.1* 30.3*  PLT 349 338   BMET Recent Labs    06/23/18 0501 06/24/18 0436  NA 136 133*  K 3.9 3.7  CL 93* 95*  CO2 34* 32  GLUCOSE 182* 193*  BUN 16 25*  CREATININE 0.56* 0.57*  CALCIUM 8.5* 8.6*   PT/INR No results for input(s): LABPROT, INR in the last 72 hours. CMP     Component Value Date/Time   NA 133 (L) 06/24/2018 0436   NA 137 05/11/2017 1437   NA 133 (L) 10/06/2016 1220   K 3.7 06/24/2018 0436   K 4.7 10/06/2016 1220   CL 95 (L) 06/24/2018 0436   CO2 32 06/24/2018 0436   CO2 26 10/06/2016 1220   GLUCOSE 193 (H) 06/24/2018 0436   GLUCOSE 99 10/06/2016 1220   BUN 25 (H) 06/24/2018 0436   BUN 8 05/11/2017 1437   BUN 12.0 10/06/2016 1220   CREATININE 0.57 (L) 06/24/2018 0436   CREATININE 0.8 10/06/2016 1220   CALCIUM 8.6 (L) 06/24/2018 0436   CALCIUM 10.1 10/06/2016 1220   PROT 5.8 (L) 06/24/2018 0436   PROT 7.3 11/20/2014 1352   ALBUMIN 2.9 (L) 06/24/2018 0436   ALBUMIN 3.6 11/20/2014 1352   AST 46 (H) 06/24/2018 0436   AST 39 (H) 11/20/2014 1352   ALT 31 06/24/2018 0436   ALT 20 11/20/2014 1352   ALKPHOS 54 06/24/2018 0436   ALKPHOS 131 11/20/2014 1352   BILITOT 0.6 06/24/2018 0436   BILITOT 0.29 11/20/2014 1352   GFRNONAA >60 06/24/2018 0436   GFRAA >60 06/24/2018 0436   Lipase     Component Value Date/Time   LIPASE 53 (H) 06/13/2018 1924       Studies/Results: Dg Abd 1 View  Result Date: 06/23/2018 CLINICAL DATA:  Follow-up small bowel obstruction EXAM: ABDOMEN - 1 VIEW COMPARISON:  06/17/2018 FINDINGS: Gaseous distention of bowel again noted, predominantly small bowel compatible with small-bowel obstruction. Overall, gaseous distention of small bowel loops may over rest slightly since prior study. Interval removal of NG tube. No free air or organomegaly. IMPRESSION: Gaseous distention of small bowel compatible with small-bowel obstruction, progressed since prior study. Interval removal of NG tube. Electronically Signed   By: Rolm Baptise M.D.   On: 06/23/2018 13:05    Anti-infectives: Anti-infectives (From admission, onward)   Start     Dose/Rate Route Frequency Ordered Stop  06/22/18 1300  cefTRIAXone (ROCEPHIN) 1 g in sodium chloride 0.9 % 100 mL IVPB     1 g 200 mL/hr over 30 Minutes Intravenous Every 24 hours 06/22/18 1202 06/27/18 1259   06/22/18 1300  azithromycin (ZITHROMAX) 500 mg in sodium chloride 0.9 % 250 mL IVPB     500 mg 250 mL/hr over 60 Minutes Intravenous Every 24 hours 06/22/18 1202 06/27/18 1259   06/18/18 0600  ceFAZolin (ANCEF) IVPB 2g/100 mL premix  Status:  Discontinued     2 g 200 mL/hr over 30 Minutes Intravenous On call to O.R. 06/18/18 0531 06/18/18 1211     . azithromycin 500 mg (06/23/18 1445)  . cefTRIAXone (ROCEPHIN)  IV 1 g (06/23/18 1307)  . magnesium sulfate 1 - 4 g bolus IVPB 2 g (06/24/18 0920)    . methocarbamol (ROBAXIN) IV 500 mg (06/16/18 2304)  . TPN ADULT (ION) 65 mL/hr at 06/23/18 1803  . TPN ADULT (ION)       Assessment/Plan Hx Non-small cell lung CA Tobacco abuse - up until admission.  COPD with acute respiratory illness - post-op Acute metabolic encephalopathy/postop delirium -fall/ongoing confusion 11/12 -IV Lasix 1/10&1/11 -Medicine following Hyponatremia Hypokalemia/hypomagnesmia  Leukocytosis History of alcohol abuse Hypertension Peripheral artery disease/critical left limb ischemia- non healing leftleft first metatarsal head SevereMalnutrition/BMI 16.33 -prealbumin11.3  POD 6, open recurrent left inguinal hernia repair with mesh, Dr. Harlow Asa, 06/18/18 -Admitted 1/5 for incarcerated recurrent left inguinal hernia with small bowel obstructionw transition LLQ -Ng tube removed on 1/11 -CTA 1/13 with persistent high grade SBO. Thought to be related to edema in the incarcerated knuckle of bowel he had - was seen intraoperatively and appeared clearly viable.  -Plain film 1/15: Ongoing severe abdominal SB distension consistent with SBO  FEN: IV fluids stopped/full=>>D5 half NS +20KCL@ 100cc/hr -NPO  -TPN per pharmacy -Nutrition consulted and saw yesterday ID: Preop Ancef; Azithromycin & Rocephin (COPD)1/14 >> DVT: Lovenox Follow-up with Dr.Gerkin  Plan: Patient to remain NPO. Continue bowel rest and TPN. Mobilize and IS. Will continue to follow. Repeat films in AM.    LOS: 10 days    Jillyn Ledger , [facility] Surgery 06/24/2018, 9:46 AM Pager: [phone]  Agree with above. On TPN for malnutrition.    Dilated small bowel.  Mentally looks much better today.  He is responsive and engaging.  He has no real abdominal complaints.  Alphonsa Overall, MD, [facility] Surgery Pager: [phone] Office phone:  [phone]

## 2018-06-24 NOTE — Care Management Important Message (Signed)
Important Message  Patient Details  Name: [name] MRN: [id number] Date of Birth: 16-Aug-1950   Medicare Important Message Given:  Yes    Kerin Salen 06/24/2018, 10:41 AMImportant Message  Patient Details  Name: [name] MRN: [id number] Date of Birth: [birth year]   Medicare Important Message Given:  Yes    Kerin Salen 06/24/2018, 10:41 AM

## 2018-06-25 ENCOUNTER — Inpatient Hospital Stay (HOSPITAL_COMMUNITY): Payer: Medicare Other

## 2018-06-25 LAB — CBC
HCT: 32.5 % — ABNORMAL LOW (ref 39.0–52.0)
Hemoglobin: 10.5 g/dL — ABNORMAL LOW (ref 13.0–17.0)
MCH: 31.5 pg (ref 26.0–34.0)
MCHC: 32.3 g/dL (ref 30.0–36.0)
MCV: 97.6 fL (ref 80.0–100.0)
Platelets: 360 10*3/uL (ref 150–400)
RBC: 3.33 MIL/uL — AB (ref 4.22–5.81)
RDW: 14.6 % (ref 11.5–15.5)
WBC: 9.3 10*3/uL (ref 4.0–10.5)
nRBC: 0 % (ref 0.0–0.2)

## 2018-06-25 LAB — BASIC METABOLIC PANEL
Anion gap: 9 (ref 5–15)
BUN: 28 mg/dL — ABNORMAL HIGH (ref 8–23)
CHLORIDE: 99 mmol/L (ref 98–111)
CO2: 27 mmol/L (ref 22–32)
Calcium: 8.7 mg/dL — ABNORMAL LOW (ref 8.9–10.3)
Creatinine, Ser: 0.5 mg/dL — ABNORMAL LOW (ref 0.61–1.24)
GFR calc Af Amer: >60 mL/min (ref >60)
GFR calc non Af Amer: >60 mL/min (ref >60)
Glucose, Bld: 69 mg/dL — ABNORMAL LOW (ref 70–99)
Potassium: 4.2 mmol/L (ref 3.5–5.1)
SODIUM: 135 mmol/L (ref 135–145)

## 2018-06-25 LAB — COMPREHENSIVE METABOLIC PANEL
ALT: 42 U/L (ref 0–44)
AST: 44 U/L — ABNORMAL HIGH (ref 15–41)
Albumin: 3 g/dL — ABNORMAL LOW (ref 3.5–5.0)
Alkaline Phosphatase: 63 U/L (ref 38–126)
Anion gap: 8 (ref 5–15)
BILIRUBIN TOTAL: 0.1 mg/dL — AB (ref 0.3–1.2)
BUN: 30 mg/dL — ABNORMAL HIGH (ref 8–23)
CO2: 24 mmol/L (ref 22–32)
Calcium: 8.3 mg/dL — ABNORMAL LOW (ref 8.9–10.3)
Chloride: 102 mmol/L (ref 98–111)
Creatinine, Ser: 0.5 mg/dL — ABNORMAL LOW (ref 0.61–1.24)
GFR calc Af Amer: >60 mL/min (ref >60)
GFR calc non Af Amer: >60 mL/min (ref >60)
Glucose, Bld: 176 mg/dL — ABNORMAL HIGH (ref 70–99)
POTASSIUM: 4.4 mmol/L (ref 3.5–5.1)
Sodium: 134 mmol/L — ABNORMAL LOW (ref 135–145)
Total Protein: 6.5 g/dL (ref 6.5–8.1)

## 2018-06-25 LAB — GLUCOSE, CAPILLARY
GLUCOSE-CAPILLARY: 153 mg/dL — AB (ref 70–99)
GLUCOSE-CAPILLARY: 85 mg/dL (ref 70–99)
Glucose-Capillary: 161 mg/dL — ABNORMAL HIGH (ref 70–99)
Glucose-Capillary: 162 mg/dL — ABNORMAL HIGH (ref 70–99)
Glucose-Capillary: 109 mg/dL — ABNORMAL HIGH (ref 70–99)
Glucose-Capillary: 109 mg/dL — ABNORMAL HIGH (ref 70–99)
Glucose-Capillary: 141 mg/dL — ABNORMAL HIGH (ref 70–99)
Glucose-Capillary: 150 mg/dL — ABNORMAL HIGH (ref 70–99)

## 2018-06-25 LAB — MAGNESIUM: Magnesium: 2.2 mg/dL (ref 1.7–2.4)

## 2018-06-25 LAB — PHOSPHORUS: Phosphorus: 3.1 mg/dL (ref 2.5–4.6)

## 2018-06-25 MED ORDER — BOOST / RESOURCE BREEZE PO LIQD CUSTOM
1.0000 | Freq: Three times a day (TID) | ORAL | Status: DC
  Administered 2018-06-25 – 2018-06-28 (×8): 1 via ORAL

## 2018-06-25 MED ORDER — IPRATROPIUM-ALBUTEROL 0.5-2.5 (3) MG/3ML IN SOLN
3.0000 mL | Freq: Four times a day (QID) | RESPIRATORY_TRACT | Status: DC
  Administered 2018-06-25 – 2018-06-28 (×15): 3 mL via RESPIRATORY_TRACT
  Filled 2018-06-25 (×15): qty 3

## 2018-06-25 MED ORDER — INSULIN ASPART 100 UNIT/ML ~~LOC~~ SOLN
0.0000 [IU] | SUBCUTANEOUS | Status: DC
  Administered 2018-06-25 (×2): 1 [IU] via SUBCUTANEOUS
  Administered 2018-06-26: 2 [IU] via SUBCUTANEOUS
  Administered 2018-06-26: 1 [IU] via SUBCUTANEOUS

## 2018-06-25 MED ORDER — TRAVASOL 10 % IV SOLN
INTRAVENOUS | Status: AC
  Administered 2018-06-25: 18:00:00 via INTRAVENOUS
  Filled 2018-06-25: qty 842.4

## 2018-06-25 NOTE — Progress Notes (Addendum)
Patient ID: [name], male   DOB: [birth year], 68 y.o.   MRN: [id number]    7 Days Post-Op  Subjective: Awake, alert and pleasant this morning.  Sitting up in chair eating ice chips.  Denies abdominal pain.  No nausea or vomiting.  Reports passing flatus.  Several loose bowel movements overnight.  Has not been mobilizing halls.  Using IS.   Objective: Vital signs in last 24 hours: Temp:  [97.6 F (36.4 C)-98.6 F (37 C)] 97.8 F (36.6 C) (01/17 0623) Pulse Rate:  [75-83] 79 (01/17 0623) Resp:  [14-18] 18 (01/17 0623) BP: (150-165)/(89-98) 162/98 (01/17 0623) SpO2:  [95 %-100 %] 95 % (01/17 0746) Last BM Date: 06/24/18  Intake/Output from previous day: 01/16 0701 - 01/17 0700 In: 1885.4 [I.V.:1535.4; IV Piggyback:350] Out: 0354 [Urine:1150] Intake/Output this shift: No intake/output data recorded.  PE: Gen: Awake, alert, pleasant. Frail appearing.  Heart: RRR, no obvious murmurs Lungs: CTA b/l. Normal effort. On O2.  Abd: Soft, mildly distended. Non-tender on palpation. +BS GU: Incision c/d/i. Mild edema. Right inguinal hernia reducible.   Lab Results:  Recent Labs    06/23/18 0501 06/25/18 0315  WBC 9.1 9.3  HGB 10.0* 10.5*  HCT 30.3* 32.5*  PLT 338 360   BMET Recent Labs    06/24/18 0436 06/25/18 0315  NA 133* 135  K 3.7 4.2  CL 95* 99  CO2 32 27  GLUCOSE 193* 69*  BUN 25* 28*  CREATININE 0.57* 0.50*  CALCIUM 8.6* 8.7*   PT/INR No results for input(s): LABPROT, INR in the last 72 hours. CMP     Component Value Date/Time   NA 135 06/25/2018 0315   NA 137 05/11/2017 1437   NA 133 (L) 10/06/2016 1220   K 4.2 06/25/2018 0315   K 4.7 10/06/2016 1220   CL 99 06/25/2018 0315   CO2 27 06/25/2018 0315   CO2 26 10/06/2016 1220   GLUCOSE 69 (L) 06/25/2018 0315   GLUCOSE 99 10/06/2016 1220   BUN 28 (H) 06/25/2018 0315   BUN 8 05/11/2017 1437   BUN 12.0 10/06/2016 1220   CREATININE 0.50 (L) 06/25/2018 0315   CREATININE 0.8 10/06/2016 1220   CALCIUM 8.7 (L) 06/25/2018 0315   CALCIUM 10.1 10/06/2016 1220   PROT 5.8 (L) 06/24/2018 0436   PROT 7.3 11/20/2014 1352   ALBUMIN 2.9 (L) 06/24/2018 0436   ALBUMIN 3.6 11/20/2014 1352   AST 46 (H) 06/24/2018 0436   AST 39 (H) 11/20/2014 1352   ALT 31 06/24/2018 0436   ALT 20 11/20/2014 1352   ALKPHOS 54 06/24/2018 0436   ALKPHOS 131 11/20/2014 1352   BILITOT 0.6 06/24/2018 0436   BILITOT 0.29 11/20/2014 1352   GFRNONAA >60 06/25/2018 0315   GFRAA >60 06/25/2018 0315   Lipase     Component Value Date/Time   LIPASE 53 (H) 06/13/2018 1924       Studies/Results: Dg Abd 1 View  Result Date: 06/23/2018 CLINICAL DATA:  Follow-up small bowel obstruction EXAM: ABDOMEN - 1 VIEW COMPARISON:  06/17/2018 FINDINGS: Gaseous distention of bowel again noted, predominantly small bowel compatible with small-bowel obstruction. Overall, gaseous distention of small bowel loops may over rest slightly since prior study. Interval removal of NG tube. No free air or organomegaly. IMPRESSION: Gaseous distention of small bowel compatible with small-bowel obstruction, progressed since prior study. Interval removal of NG tube. Electronically Signed   By: Rolm Baptise M.D.   On: 06/23/2018 13:05   Dg Abd Portable 1v  Result Date: 06/25/2018 CLINICAL DATA:  Follow up small bowel obstruction EXAM: PORTABLE ABDOMEN - 1 VIEW COMPARISON:  06/23/2018 FINDINGS: Scattered large and small bowel gas is noted. Multiple dilated loops of small bowel are again identified and stable. No free air is seen. Changes of chronic pancreatitis are noted. Vascular calcifications with right iliac stenting noted. IMPRESSION: Stable small bowel dilatation Electronically Signed   By: Inez Catalina M.D.   On: 06/25/2018 07:17    Anti-infectives: Anti-infectives (From admission, onward)   Start     Dose/Rate Route Frequency Ordered Stop   06/22/18 1300  cefTRIAXone (ROCEPHIN) 1 g in sodium chloride 0.9 % 100 mL IVPB     1 g 200 mL/hr  over 30 Minutes Intravenous Every 24 hours 06/22/18 1202 06/27/18 1259   06/22/18 1300  azithromycin (ZITHROMAX) 500 mg in sodium chloride 0.9 % 250 mL IVPB     500 mg 250 mL/hr over 60 Minutes Intravenous Every 24 hours 06/22/18 1202 06/27/18 1259   06/18/18 0600  ceFAZolin (ANCEF) IVPB 2g/100 mL premix  Status:  Discontinued     2 g 200 mL/hr over 30 Minutes Intravenous On call to O.R. 06/18/18 0531 06/18/18 1211       Assessment/Plan Hx Non-small cell lung CA Tobacco abuse - up until admission.  COPD with acute respiratory illness - post-op - followed by Pulm Acute metabolic encephalopathy/postop delirium -fall/ongoing confusion 11/12. Neg Ct head -IV Lasix 1/10&1/11 -Medicine following Hyponatremia- resolved Hypokalemia/hypomagnesmia - resolved Leukocytosis - resolved  History of alcohol abuse Hypertension PAD/critical left limb ischemia- non healing leftleft first metatarsal head   -Vascular has seen the patient SevereMalnutrition/BMI 16.33 -prealbumin11.3  POD 7, open recurrent left inguinal hernia repair with mesh, Dr. Harlow Asa, 06/18/18 -Admitted 1/5 for incarcerated recurrent left inguinal hernia with small bowel obstructionw transition LLQ -Ng tube removed on 1/11 -CTA 1/13 with persistent high grade SBO. Thought to be related to edema in the incarcerated knuckle of bowel he had - was seen intraoperatively and appeared clearly viable.  -Plain film 1/15: Ongoing severe abdominal SB distension consistent with SBO -Plan film 1/17 - Continued small bowel dilatation.  -Clinically doing well. No abdominal pain, N/V, passing flatus and loose BM -Will give trial of sips of clears from floor   FEN: Trial of sips of clears from floor; TPN from pharmacy/nurition ID: Preop Ancef; Azithromycin & Rocephin (COPD)1/14 >> DVT: Lovenox Follow-up with Dr.Gerkin  Plan: Xray with continued small bowel dilatation.  However patient is clinically doing well.  His mental status  has improved, he is without any abdominal pain, nausea, vomiting and continues to pass flatus/loose bowel movements.    Will give trial of sips of clears from the floor and re-evaluated this afternoon. May benefit from 8hr post gastrografin xray this weekend if not progressing as expected. PT/OT to see and help mobilize.   LOS: 11 days    Jillyn Ledger , [facility] Surgery 06/25/2018, 8:48 AM Pager: [phone]  Agree with above. Mentally better that last couple of days. In spite of gaseous small bowel pattern, we will let him see how much he can take po.  Alphonsa Overall, MD, [facility] Surgery Pager: [phone] Office phone:  [phone]

## 2018-06-25 NOTE — Progress Notes (Signed)
Physical Therapy Treatment Patient Details Name: [name] MRN: [id number] DOB: 02-15-1951 Today's Date: 06/25/2018    History of Present Illness Patient has poor pulmonary reserve due to advanced COPD, continued smoker, now s/p inguinal hernia repair, had increased confusion post op, at risk of decompensation, was transferred to stepdown unit on 1/10; pt now on medical floor, had a fall this morning reportedly trying to get to bathroom;    PT Comments    Assisted OOB to amb to bathroom then a greater distance in hallway.  Tolerated well.  Last session pt was 2/4 dyspnea but this session none.  Remained on 4 lts nasal.    Follow Up Recommendations  Home health PT     Equipment Recommendations  Rolling walker with 5" wheels;3in1 (PT);Other (comment)(oxygen)    Recommendations for Other Services       Precautions / Restrictions Precautions Precautions: Fall Precaution Comments: O2,monitor sats Restrictions Weight Bearing Restrictions: No    Mobility  Bed Mobility Overal bed mobility: Needs Assistance Bed Mobility: Supine to Sit;Sit to Supine     Supine to sit: Supervision Sit to supine: Supervision   General bed mobility comments: pt able to self perform.  safety with IV line only   Transfers Overall transfer level: Needs assistance Equipment used: Rolling walker (2 wheeled) Transfers: Sit to/from [facility] Sit to Stand: Supervision;Min guard Stand pivot transfers: Supervision;Min guard       General transfer comment: cues for safety and hand placement using walker   Ambulation/Gait Ambulation/Gait assistance: Supervision;Min guard Gait Distance (Feet): 125 Feet Assistive device: Rolling walker (2 wheeled) Gait Pattern/deviations: Step-through pattern;Narrow base of support;Trunk flexed;Decreased stride length Gait velocity: decreased    General Gait Details: tolerated an increased distance, remained on 4 lts nasal.  no dyspnea     Stairs             Wheelchair Mobility    Modified Rankin (Stroke Patients Only)       Balance                                            Cognition Arousal/Alertness: Awake/alert Behavior During Therapy: WFL for tasks assessed/performed                                   General Comments: pleasant and conversive      Exercises      General Comments        Pertinent Vitals/Pain Pain Assessment: No/denies pain    Home Living                      Prior Function            PT Goals (current goals can now be found in the care plan section) Progress towards PT goals: Progressing toward goals    Frequency    Min 3X/week      PT Plan Current plan remains appropriate    Co-evaluation              AM-PAC PT "6 Clicks" Mobility   Outcome Measure  Help needed turning from your back to your side while in a flat bed without using bedrails?: A Little Help needed moving from lying on your back to sitting on the side of a flat bed  without using bedrails?: A Little Help needed moving to and from a bed to a chair (including a wheelchair)?: A Little Help needed standing up from a chair using your arms (e.g., wheelchair or bedside chair)?: A Little Help needed to walk in hospital room?: A Little Help needed climbing 3-5 steps with a railing? : A Little 6 Click Score: 18    End of Session Equipment Utilized During Treatment: Gait belt Activity Tolerance: Patient tolerated treatment well Patient left: in bed;with call bell/phone within reach;with bed alarm set;with family/visitor present   PT Visit Diagnosis: Unsteadiness on feet (R26.81);History of falling (Z91.81)     Time: 7505-1833 PT Time Calculation (min) (ACUTE ONLY): 27 min  Charges:  $Gait Training: 8-22 mins $Therapeutic Activity: 8-22 mins                     Rica Koyanagi  PTA Acute  Rehabilitation Services Pager      [phone] Office       [phone]

## 2018-06-25 NOTE — Progress Notes (Signed)
PHARMACY - ADULT TOTAL PARENTERAL NUTRITION CONSULT NOTE   Pharmacy Consult for TPN Indication: NPO for incarcerated bowel; too malnourished for surgery  Patient Measurements: Height: 5\' 2"  (157.5 cm) Weight: 83 lb 12.4 oz (38 kg) IBW/kg (Calculated) : 54.6 TPN AdjBW (KG): 40.5 Body mass index is 15.32 kg/m. 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 Presents this admission with SBO due to hernia, now s/p open inguinal hernia repair but CT still shows persistent SBO separate from hernia repair site - likely d/t edema/congestion from prior incarceration.  Central access:  PICC placed 1/14 TPN start date: 1/14   Significant events:  1/15: made NPO, chg MV, lytes to IV or in TPN 1/17: slight return of bowel function, advancing to clears  Insulin Requirements: 10 units yesterday  Current Nutrition: CLD  IVF: none  Today, 06/25/2018:  Glucose (CBG goal 100-150) - range 85-162; one SBG slightly low after advancing to moderate scale SSI (note low TBW). Solumedrol x1 on 1/14  Electrolytes - all WNL after correction yesterday  Renal - SCr stable WNL; bicarb decreased to WNL; BUN had decreased to WNL, now rising again  LFTs - (1/16) Albumin low, AST increased overnight but still < 50  TGs - WNL (1/15)  Prealbumin - moderately low (1/14)  NUTRITIONAL GOALS                                                                                             RD recs: (1/13) Kcal:  1450-1650 kcal Protein:  70-85 grams Fluid:  >/= 1.4 L/day  PLAN                                                                                                                          At 1800 today:  Continue TPN at 65 ml/hr; maxing out  protein to allow for  reduced dextrose  Adjusted TPN provides 84 g of protein and 1464 kcals still meeting 100% of patient needs  TPN to contain MVI and trace elements  Electrolytes in TPN: maintain increased Na, K, Mg, others standard concentration; Cl:Ac = 2:1  Reduce SSI back to sensitive, but will continue q4 hr CBG checks  TPN lab panels on Mondays & Thursdays; CMP, Mg tomorrow (f/u for any bump in LFTs)  Follow for tolerability and advancement of diet  Reuel Boom, PharmD, BCPS [phone] 06/25/2018, 8:09 AM

## 2018-06-25 NOTE — Plan of Care (Signed)
Patient up on [facility] on entry into room. No complaints of pain currently. Patient a&o and talkative this morning. Will continue to monitor.

## 2018-06-25 NOTE — Progress Notes (Signed)
PROGRESS NOTE    [name]  IEP:[id number] DOB: 02/10/1951 DOA: 06/13/2018 PCP: [name], MD    Brief Narrative: 68 year old with past medical history significant for COPD not on home oxygen, peripheral vascular disease a status post DES in 03/2018, history of non-small cell lung cancer status post radiation who presents with nausea vomiting found to have SBO from inguinal hernia. Patient was cleared by cardiology for surgery.  Patient underwent left inguinal hernia repair with mesh on June 18, 2018 Dr. Harle Stanford  Assessment & Plan:   Principal Problem:   Incarcerated left inguinal hernia s/p repair 06/18/2018 Active Problems:   Smoker   COPD (chronic obstructive pulmonary disease) ([place])   Essential hypertension   Protein-calorie malnutrition, severe (HCC)   Severe claudication (HCC)   PAD (peripheral artery disease) ([place])   Acute hypoxemic respiratory failure (HCC)   Acute metabolic encephalopathy   SBO (small bowel obstruction) ([place])   Inguinal hernia of left side with obstruction and without gangrene   1-SBO 2/2 left inguinal hernia --On general surgery service, Plavix held for 5 days, he is brought to the OR  On 1/10 and underwent OPEN RECURRENT LEFT INGUINAL HERNIA REPAIR WITH MESH (Left), INSERTION OF MESH (Left) -general surgery oked to Resume plavix  -he is started on TPN on 1/14 due to persistent ileus, hold all oral meds --plan per general surgery Per CT angios on January 13 there is persistent high-grade small bowel obstruction KUB 1-15 persistent dilation SBO. Continue with NPO.  KUB stable, clinically improving. Start sips of clear.   Postop hypoxia  -Patient has poor pulmonary reserve due to advanced COPD, continued smoker,  status post abdomen surgery, with increased confusion, at risk of decompensation, he is transferred to stepdown unit on 1/10 -stat chest x-ray obtained on 1/10 showed "Small new bilateral pleural effusions with probable atelectasis at  the left base" he received lasix 20mg  iv x1 on 1/10 and 20mg  iv x1 on 1/11 with good diruesis, he received another dose of lasix 40mg  on 1/14. Will likely need lasix prn while on TPN. -critical care consulted, input appreciated --persistent hypoxia, girlfriends reports this is new,  CTA chest no PE, does show mucus plugging and infiltrates, he start to have diffuse wheezing on 1/14, will treat as COPD exacerbation, start steroids, increase nebs, chest PT, start rocephin/zithro , plan for 5 days abx treatment. Discussed with CCM will try to taper off IV steroid. Will further decrease dose tomorrow.  Respiratory  toilet.  Acute metabolic encephalopathy/delirium on 1/10: Likely multifactorial , alcohol withdrawal? Hyponatremia, surgery with anesthesia, inhospital delirium -per family patient has h/o confusion in the past during hospitalization -ct head no acute findings, lactic acid wnl, blood culture no growth - avoid dilaudid ,ativan , d/c tele -low dose zyprexa started on 1/12, girlfriend states it made him more agitated.  Avoid Zyprexa -patient was very sleepy on my initial evaluation. CBG was normal. Vitals stable. Subsequently he wake up.   H/o internal hemorrhoids -Seen by Dr. Thana Farr in April 2019, s/p rubber band ligation  -Blood per rectum this morning, denies abdominal pain -Defer to general surgery  Hyponatremia: Continue with IV fluids.   Hypokalemia:   resolved/.   Hypomagnesemia: replete with IV magnesium  COPD/heavy smoker -Not on home O2 -Likely will benefit O2 at discharge ( per RN prior to surgery patient o2 dropped to the 70's on room air  while Ambulating with one person  Assist to the bathroom) -need outpatient follow up for COPD  Left  upper lobe lung cancer  Lung nodules x2 left upper lobe and 1 left lower lobe s/p XRT -Case discussed with radiation oncology Dr. Tammi Klippel who stated patient has stage Ia status post curative intent treatment XRT finished in  July 2019 -Per Dr. Tammi Klippel patient missed his restaging scan, Dr. Tammi Klippel recommended CT chest with IV contrast prior to discharge which is done on 1/13 -pa and follow with him every 34-month.   PAD s/p stent -PAD s/pSuccessful self expanding Eluvia 7.0 X 60 mm stent placement Rt external iliac arteryon 03/30/2018 by cardiology Dr Virgina Jock,  -Has an appointment with vascular surgery Dr Donnetta Hutching on 2/11 -Girl friend reports patient has an appointment with Dr Donnetta Hutching on 1/6 however patient was admitted to the hospital Girl friend reports patient has increase pain on left leg while walking, she request vascular being consulted while patient is in the hospital, will contact vascular surgery. -vascular surgery Dr Scot Dock input appreciated, patient underwent CT angiogram, per Dr Scot Dock think patient likely will need bypass surgery, but do not think he could tolerate major vascular surgery given his respiratory status, severely debilitated state, and severe protein calorie malnutrition., recommend outpatient follow up with him.  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 Nutrition consult on TPN/       Nutrition Problem: Severe Malnutrition Etiology: chronic illness(COPD)    Signs/Symptoms: energy intake < or equal to 75% for > or equal to 1 month, severe fat depletion, severe muscle depletion, percent weight loss    Interventions: MVI, [employer] Instant Breakfast  Estimated body mass index is 15.32 kg/m as calculated from the following:   Height as of this encounter: 5\' 2"  (1.575 m).   Weight as of this encounter: 38 kg.   DVT prophylaxis: Lovenox Code Status: Full code Family  Communication; care discussed with patient Disposition Plan: Patient with persistent anemia, requiring IV TPN.  Consultants:   General surgery as primary  hospitalist as consultant  Cardiology as consultant  Pulmonary/critical care   Procedures:  Ng placement and removal  on 1/10 s/p OPEN RECURRENT LEFT INGUINAL HERNIA REPAIR WITH MESH (Left), INSERTION OF MESH (Left) TPN    Antimicrobials:   Perioperative  Rocephin/zithro from 1/14 , plan for 5 days   Subjective: Very sleepy, hard to wake up. Subsequently wake up. answer questions.   Objective: Vitals:   06/25/18 0600 06/25/18 0623 06/25/18 0746 06/25/18 1103  BP: (!) 162/98 (!) 162/98  (!) 144/80  Pulse: 79 79  80  Resp: 16 18  16   Temp: 97.8 F (36.6 C) 97.8 F (36.6 C)  98.2 F (36.8 C)  TempSrc: Axillary Oral  Oral  SpO2: 95% 95% 95% 97%  Weight:      Height:        Intake/Output Summary (Last 24 hours) at 06/25/2018 1220 Last data filed at 06/25/2018 1000 Gross per 24 hour  Intake 1745.39 ml  Output 1450 ml  Net 295.39 ml   Filed Weights   06/18/18 0924 06/18/18 1437 06/22/18 0845  Weight: 38.1 kg 40.5 kg 38 kg    Examination:  General exam: Cachetic, NAD Respiratory system: Bilateral ronchus.  Cardiovascular system: S 1, S 2 RRR Gastrointestinal system: BS present, soft, ntincision and inguinal area clean  and healing Central nervous system; alert.  Extremities: Symmetric power.  Skin: No rashes, lesions or ulcers    Data Reviewed: I have personally reviewed following labs and imaging studies  CBC: Recent Labs  Lab 06/20/18 0302 06/21/18 0427 06/22/18 0520 06/23/18 0501 06/25/18 0315  WBC 9.9 14.7* 15.5* 9.1 9.3  NEUTROABS  --   --   --  8.1*  --   HGB 9.3* 11.5* 11.5* 10.0* 10.5*  HCT 28.1* 34.3* 35.1* 30.3* 32.5*  MCV 96.9 96.3 94.9 96.5 97.6  PLT 238 300 349 338 073   Basic Metabolic Panel: Recent Labs  Lab 06/21/18 0427 06/22/18 0520 06/23/18 0501 06/24/18 0436  06/25/18 0315  NA 132* 136 136 133* 135  K 3.3* 3.3* 3.9 3.7 4.2  CL 88* 92* 93* 95* 99  CO2 34* 35* 34* 32 27  GLUCOSE 68* 164* 182* 193* 69*  BUN 15 10 16  25* 28*  CREATININE 0.68 0.62 0.56* 0.57* 0.50*  CALCIUM 8.8* 8.6* 8.5* 8.6* 8.7*  MG 1.8 1.6* 2.0 1.6* 2.2  PHOS  --  2.2* 2.5 2.8 3.1   GFR: Estimated Creatinine Clearance: 48.8 mL/min (A) (by C-G formula based on SCr of 0.5 mg/dL (L)). Liver Function Tests: Recent Labs  Lab 06/22/18 0520 06/23/18 0501 06/24/18 0436  AST 32 29 46*  ALT 20 21 31   ALKPHOS 57 56 54  BILITOT 0.8 0.5 0.6  PROT 6.1* 5.8* 5.8*  ALBUMIN 3.0* 2.7* 2.9*   No results for input(s): LIPASE, AMYLASE in the last 168 hours. No results for input(s): AMMONIA in the last 168 hours. Coagulation Profile: No results for input(s): INR, PROTIME in the last 168 hours. Cardiac Enzymes: No results for input(s): CKTOTAL, CKMB, CKMBINDEX, TROPONINI in the last 168 hours. BNP (last 3 results) No results for input(s): PROBNP in the last 8760 hours. HbA1C: No results for input(s): HGBA1C in the last 72 hours. CBG: Recent Labs  Lab 06/25/18 0340 06/25/18 0622 06/25/18 0735 06/25/18 1059 06/25/18 1209  GLUCAP 85 161* 162* 109* 109*   Lipid Profile: Recent Labs    06/23/18 0505  TRIG 103   Thyroid Function Tests: No results for input(s): TSH, T4TOTAL, FREET4, T3FREE, THYROIDAB in the last 72 hours. Anemia Panel: No results for input(s): VITAMINB12, FOLATE, FERRITIN, TIBC, IRON, RETICCTPCT in the last 72 hours. Sepsis Labs: Recent Labs  Lab 06/19/18 1954  LATICACIDVEN 1.5    Recent Results (from the past 240 hour(s))  Surgical PCR screen     Status: None   Collection Time: 06/18/18  1:47 AM  Result Value Ref Range Status   MRSA, PCR NEGATIVE NEGATIVE Final   Staphylococcus aureus NEGATIVE NEGATIVE Final    Comment: (NOTE) The Xpert SA Assay (FDA approved for NASAL specimens in patients 3 years of age and older), is one component of a  comprehensive surveillance program. It is not intended to diagnose infection nor to guide or monitor treatment. Performed at [facility], [place] [address], [place], [place] [address]   Culture, blood (routine x 2)     Status: None   Collection Time: 06/19/18  7:54 PM  Result Value Ref Range Status   Specimen Description BLOOD LEFT ANTECUBITAL  Final   Special Requests   Final    BOTTLES DRAWN AEROBIC ONLY Blood Culture adequate volume Performed at [place] [address] [place], [place] [address]    Culture   Final    NO GROWTH 5 DAYS Performed at [facility], [place] [address], [place], [place] [address]    Report Status 06/24/2018 FINAL  Final  Culture, blood (routine x 2)     Status: None   Collection Time: 06/19/18  7:54 PM  Result Value Ref Range Status   Specimen Description   Final    BLOOD LEFT ARM Performed at [place] [address] [place], [place] [address]    Special Requests   Final    BOTTLES DRAWN AEROBIC ONLY Blood Culture adequate volume Performed at [place] [address], [place], [place] [address]    Culture   Final    NO GROWTH 5 DAYS Performed at [facility], [place] [address] [place], [place] [address]    Report Status 06/24/2018 FINAL  Final  Expectorated sputum assessment w rflx to resp cult     Status: None   Collection Time: 06/22/18  4:50 PM  Result Value Ref Range Status   Specimen Description SPU EXPECTORATED  Final   Special Requests NONE  Final   Sputum evaluation   Final    Sputum specimen not acceptable for testing.  Please recollect.   RESULTS CALLED TO A. PEREZ,RN [id number] @ [place] Performed at [facility], [place] [address], [place], [place] [address]    Report Status 06/22/2018 FINAL  Final         Radiology Studies: Dg Abd 1 View  Result Date: 06/23/2018 CLINICAL DATA:  Follow-up small bowel  obstruction EXAM: ABDOMEN - 1 VIEW COMPARISON:  06/17/2018 FINDINGS: Gaseous distention of bowel again noted, predominantly small bowel compatible with small-bowel obstruction. Overall, gaseous distention of small bowel loops may over rest slightly since prior study. Interval removal of NG tube. No free air or organomegaly. IMPRESSION: Gaseous distention of small bowel compatible with small-bowel obstruction, progressed since prior study. Interval removal of NG tube. Electronically Signed   By: Rolm Baptise M.D.   On: 06/23/2018 13:05   Dg Abd Portable 1v  Result Date: 06/25/2018 CLINICAL DATA:  Follow up small bowel obstruction EXAM: PORTABLE ABDOMEN - 1 VIEW COMPARISON:  06/23/2018 FINDINGS: Scattered large and small bowel gas is noted. Multiple dilated loops of small bowel are again identified and stable. No free air is seen. Changes of chronic pancreatitis are noted. Vascular calcifications with right iliac stenting noted. IMPRESSION: Stable small bowel dilatation Electronically Signed   By: Inez Catalina M.D.   On: 06/25/2018 07:17        Scheduled Meds: . budesonide (PULMICORT) nebulizer solution  0.5 mg Nebulization BID  . enoxaparin (LOVENOX) injection  30 mg Subcutaneous Q24H  . feeding supplement (ENSURE ENLIVE)  237 mL Oral TID BM  . folic acid  1 mg Intravenous Daily  . hydrocortisone-pramoxine   Rectal QID  . insulin aspart  0-9 Units Subcutaneous Q4H  . ipratropium-albuterol  3 mL Nebulization QID  . mouth rinse  15 mL Mouth Rinse BID  . metoprolol tartrate  2.5 mg Intravenous Q8H  . thiamine injection  100 mg Intravenous Daily   Continuous Infusions: . azithromycin Stopped (06/24/18 1445)  . cefTRIAXone (ROCEPHIN)  IV Stopped (06/24/18 1254)  . methocarbamol (ROBAXIN) IV 500 mg (06/16/18 2304)  . TPN ADULT (ION) 65 mL/hr at 06/24/18 2200  . TPN ADULT (ION)       LOS: 11 days    Time spent: 35 minutes.     Elmarie Shiley, MD [facility]  If 7PM-7AM,  please  contact night-coverage [email or web address] Password [email or web address] 06/25/2018, 12:20 PM

## 2018-06-26 LAB — GLUCOSE, CAPILLARY
Glucose-Capillary: 143 mg/dL — ABNORMAL HIGH (ref 70–99)
Glucose-Capillary: 150 mg/dL — ABNORMAL HIGH (ref 70–99)
Glucose-Capillary: 154 mg/dL — ABNORMAL HIGH (ref 70–99)
Glucose-Capillary: 175 mg/dL — ABNORMAL HIGH (ref 70–99)

## 2018-06-26 LAB — CBC
HCT: 30 % — ABNORMAL LOW (ref 39.0–52.0)
Hemoglobin: 9.7 g/dL — ABNORMAL LOW (ref 13.0–17.0)
MCH: 31.9 pg (ref 26.0–34.0)
MCHC: 32.3 g/dL (ref 30.0–36.0)
MCV: 98.7 fL (ref 80.0–100.0)
Platelets: 326 10*3/uL (ref 150–400)
RBC: 3.04 MIL/uL — AB (ref 4.22–5.81)
RDW: 15 % (ref 11.5–15.5)
WBC: 8.9 10*3/uL (ref 4.0–10.5)
nRBC: 0 % (ref 0.0–0.2)

## 2018-06-26 LAB — BASIC METABOLIC PANEL
Anion gap: 8 (ref 5–15)
BUN: 30 mg/dL — ABNORMAL HIGH (ref 8–23)
CALCIUM: 8.7 mg/dL — AB (ref 8.9–10.3)
CO2: 22 mmol/L (ref 22–32)
Chloride: 103 mmol/L (ref 98–111)
Creatinine, Ser: 0.56 mg/dL — ABNORMAL LOW (ref 0.61–1.24)
GFR calc Af Amer: >60 mL/min (ref >60)
GFR calc non Af Amer: >60 mL/min (ref >60)
Glucose, Bld: 173 mg/dL — ABNORMAL HIGH (ref 70–99)
Potassium: 4.5 mmol/L (ref 3.5–5.1)
Sodium: 133 mmol/L — ABNORMAL LOW (ref 135–145)

## 2018-06-26 LAB — MAGNESIUM: Magnesium: 2 mg/dL (ref 1.7–2.4)

## 2018-06-26 MED ORDER — INSULIN ASPART 100 UNIT/ML ~~LOC~~ SOLN
0.0000 [IU] | Freq: Three times a day (TID) | SUBCUTANEOUS | Status: DC
  Administered 2018-06-26: 2 [IU] via SUBCUTANEOUS
  Administered 2018-06-26: 1 [IU] via SUBCUTANEOUS
  Administered 2018-06-27 – 2018-06-29 (×9): 2 [IU] via SUBCUTANEOUS

## 2018-06-26 MED ORDER — DOCUSATE SODIUM 100 MG PO CAPS
100.0000 mg | ORAL_CAPSULE | Freq: Two times a day (BID) | ORAL | Status: DC
  Administered 2018-06-26 – 2018-06-29 (×7): 100 mg via ORAL
  Filled 2018-06-26 (×7): qty 1

## 2018-06-26 MED ORDER — FOLIC ACID 1 MG PO TABS
1.0000 mg | ORAL_TABLET | Freq: Every day | ORAL | Status: DC
  Administered 2018-06-26 – 2018-06-29 (×4): 1 mg via ORAL
  Filled 2018-06-26 (×4): qty 1

## 2018-06-26 MED ORDER — TRAVASOL 10 % IV SOLN
INTRAVENOUS | Status: AC
  Administered 2018-06-26: 17:00:00 via INTRAVENOUS
  Filled 2018-06-26: qty 842.4

## 2018-06-26 MED ORDER — VITAMIN B-1 100 MG PO TABS
100.0000 mg | ORAL_TABLET | Freq: Every day | ORAL | Status: DC
  Administered 2018-06-26 – 2018-06-28 (×3): 100 mg via ORAL
  Filled 2018-06-26 (×3): qty 1

## 2018-06-26 NOTE — Progress Notes (Signed)
PROGRESS NOTE    [name]  KZS:[id number] DOB: March 16, 1951 DOA: 06/13/2018 PCP: [name], MD    Brief Narrative: 68 year old with past medical history significant for COPD not on home oxygen, peripheral vascular disease a status post DES in 03/2018, history of non-small cell lung cancer status post radiation who presents with nausea vomiting found to have SBO from inguinal hernia. Patient was cleared by cardiology for surgery.  Patient underwent left inguinal hernia repair with mesh on June 18, 2018 Dr. Harle Stanford  Assessment & Plan:   Principal Problem:   Incarcerated left inguinal hernia s/p repair 06/18/2018 Active Problems:   Smoker   COPD (chronic obstructive pulmonary disease) ([place])   Essential hypertension   Protein-calorie malnutrition, severe (HCC)   Severe claudication (HCC)   PAD (peripheral artery disease) ([place])   Acute hypoxemic respiratory failure (HCC)   Acute metabolic encephalopathy   SBO (small bowel obstruction) ([place])   Inguinal hernia of left side with obstruction and without gangrene   1-SBO 2/2 left inguinal hernia --On general surgery service, Plavix held for 5 days, he is brought to the OR  On 1/10 and underwent OPEN RECURRENT LEFT INGUINAL HERNIA REPAIR WITH MESH (Left), INSERTION OF MESH (Left) -general surgery oked to Resume plavix  -he is started on TPN on 1/14 due to persistent ileus, hold all oral meds --plan per general surgery Per CT angios on January 13 there is persistent high-grade small bowel obstruction KUB 1-15 persistent dilation SBO. Continue with NPO.  KUB stable, clinically improving.  Started on clears.    Postop hypoxia  -Patient has poor pulmonary reserve due to advanced COPD, continued smoker,  status post abdomen surgery, with increased confusion, at risk of decompensation, he is transferred to stepdown unit on 1/10 -stat chest x-ray obtained on 1/10 showed "Small new bilateral pleural effusions with probable atelectasis at  the left base" he received lasix 20mg  iv x1 on 1/10 and 20mg  iv x1 on 1/11 with good diruesis, he received another dose of lasix 40mg  on 1/14. Will likely need lasix prn while on TPN. -critical care consulted, input appreciated --persistent hypoxia, girlfriends reports this is new,  CTA chest no PE, does show mucus plugging and infiltrates, he start to have diffuse wheezing on 1/14, will treat as COPD exacerbation, start steroids, increase nebs, chest PT, start rocephin/zithro , plan for 5 days abx treatment. Discussed with CCM , steroid stop.  Respiratory  toilet. stable.   Acute metabolic encephalopathy/delirium on 1/10: Likely multifactorial , alcohol withdrawal? Hyponatremia, surgery with anesthesia, inhospital delirium -per family patient has h/o confusion in the past during hospitalization -ct head no acute findings, lactic acid wnl, blood culture no growth - avoid dilaudid ,ativan , d/c tele -low dose zyprexa started on 1/12, girlfriend states it made him more agitated.  Avoid Zyprexa -patient was very sleepy on my initial evaluation. CBG was normal. Vitals stable. Subsequently he wake up.  -alert   H/o internal hemorrhoids -Seen by Dr. Thana Farr in April 2019, s/p rubber band ligation  -Blood per rectum this morning, denies abdominal pain -Defer to general surgery  Hyponatremia: Continue with IV fluids.   Hypokalemia:   resolved/.   Hypomagnesemia: repeat labs in am.   COPD/heavy smoker -Not on home O2 -Likely will benefit O2 at discharge ( per RN prior to surgery patient o2 dropped to the 70's on room air  while Ambulating with one person  Assist to the bathroom) -need outpatient follow up for COPD  Left upper lobe lung  cancer  Lung nodules x2 left upper lobe and 1 left lower lobe s/p XRT -Case discussed with radiation oncology Dr. Tammi Klippel who stated patient has stage Ia status post curative intent treatment XRT finished in July 2019 -Per Dr. Tammi Klippel patient missed  his restaging scan, Dr. Tammi Klippel recommended CT chest with IV contrast prior to discharge which is done on 1/13 -pa and follow with him every 13-month.   PAD s/p stent -PAD s/pSuccessful self expanding Eluvia 7.0 X 60 mm stent placement Rt external iliac arteryon 03/30/2018 by cardiology Dr Virgina Jock,  -Has an appointment with vascular surgery Dr Donnetta Hutching on 2/11 -Girl friend reports patient has an appointment with Dr Donnetta Hutching on 1/6 however patient was admitted to the hospital Girl friend reports patient has increase pain on left leg while walking, she request vascular being consulted while patient is in the hospital, will contact vascular surgery. -vascular surgery Dr Scot Dock input appreciated, patient underwent CT angiogram, per Dr Scot Dock think patient likely will need bypass surgery, but do not think he could tolerate major vascular surgery given his respiratory status, severely debilitated state, and severe protein calorie malnutrition., recommend outpatient follow up with him.  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 Nutrition consult on TPN/       Nutrition Problem: Severe Malnutrition Etiology: chronic illness(COPD)    Signs/Symptoms: energy intake < or equal to 75% for > or equal to 1 month, severe fat depletion, severe muscle depletion, percent weight loss    Interventions: MVI, [employer] Instant Breakfast  Estimated body mass index is 13.88 kg/m as calculated from the following:   Height as of this encounter: 5\' 2"  (1.575 m).   Weight as of this encounter: 34.4 kg.   DVT prophylaxis: Lovenox Code Status: Full code Family Communication; care discussed with  patient Disposition Plan: Patient with persistent anemia, requiring IV TPN.  Consultants:   General surgery as primary  hospitalist as consultant  Cardiology as consultant  Pulmonary/critical care   Procedures:  Ng placement and removal  on 1/10 s/p OPEN RECURRENT LEFT INGUINAL HERNIA REPAIR WITH MESH (Left), INSERTION OF MESH (Left) TPN    Antimicrobials:   Perioperative  Rocephin/zithro from 1/14 , plan for 5 days   Subjective: Sitting in recliner, denies dyspnea, tolerating clears.  BM yesterday  Objective: Vitals:   06/26/18 0549 06/26/18 0751 06/26/18 1100 06/26/18 1220  BP: 125/73  125/73   Pulse: 88  88   Resp: 18     Temp: 98.4 F (36.9 C)     TempSrc: Oral     SpO2: 98% 96%  93%  Weight:      Height:        Intake/Output Summary (Last 24 hours) at 06/26/2018 1325 Last data filed at 06/26/2018 1000 Gross per 24 hour  Intake 2424.12 ml  Output 425 ml  Net 1999.12 ml   Filed Weights   06/18/18 1437 06/22/18 0845 06/25/18 1418  Weight: 40.5 kg 38 kg 34.4 kg    Examination:  General exam: Cachetic, NAD Respiratory system: Less ronchus Cardiovascular system: S 1, S 2 RRR Gastrointestinal system: BS present, soft, mild tender. Incision inguinal area healing Central nervous system; alert non focal.  Extremities: Symmetric power.  Skin: No rashes,  lesions or ulcers    Data Reviewed: I have personally reviewed following labs and imaging studies  CBC: Recent Labs  Lab 06/21/18 0427 06/22/18 0520 06/23/18 0501 06/25/18 0315 06/26/18 0828  WBC 14.7* 15.5* 9.1 9.3 8.9  NEUTROABS  --   --  8.1*  --   --   HGB 11.5* 11.5* 10.0* 10.5* 9.7*  HCT 34.3* 35.1* 30.3* 32.5* 30.0*  MCV 96.3 94.9 96.5 97.6 98.7  PLT 300 349 338 360 275   Basic Metabolic Panel: Recent Labs  Lab 06/22/18 0520 06/23/18 0501 06/24/18 0436 06/25/18 0315 06/25/18 1429 06/26/18 0422 06/26/18 0828  NA 136 136 133* 135 134*  --  133*  K 3.3* 3.9 3.7 4.2 4.4   --  4.5  CL 92* 93* 95* 99 102  --  103  CO2 35* 34* 32 27 24  --  22  GLUCOSE 164* 182* 193* 69* 176*  --  173*  BUN 10 16 25* 28* 30*  --  30*  CREATININE 0.62 0.56* 0.57* 0.50* 0.50*  --  0.56*  CALCIUM 8.6* 8.5* 8.6* 8.7* 8.3*  --  8.7*  MG 1.6* 2.0 1.6* 2.2  --  2.0  --   PHOS 2.2* 2.5 2.8 3.1  --   --   --    GFR: Estimated Creatinine Clearance: 44.2 mL/min (A) (by C-G formula based on SCr of 0.56 mg/dL (L)). Liver Function Tests: Recent Labs  Lab 06/22/18 0520 06/23/18 0501 06/24/18 0436 06/25/18 1429  AST 32 29 46* 44*  ALT 20 21 31  42  ALKPHOS 57 56 54 63  BILITOT 0.8 0.5 0.6 0.1*  PROT 6.1* 5.8* 5.8* 6.5  ALBUMIN 3.0* 2.7* 2.9* 3.0*   No results for input(s): LIPASE, AMYLASE in the last 168 hours. No results for input(s): AMMONIA in the last 168 hours. Coagulation Profile: No results for input(s): INR, PROTIME in the last 168 hours. Cardiac Enzymes: No results for input(s): CKTOTAL, CKMB, CKMBINDEX, TROPONINI in the last 168 hours. BNP (last 3 results) No results for input(s): PROBNP in the last 8760 hours. HbA1C: No results for input(s): HGBA1C in the last 72 hours. CBG: Recent Labs  Lab 06/25/18 1209 06/25/18 1606 06/25/18 2019 06/26/18 0011 06/26/18 0430  GLUCAP 109* 141* 150* 154* 143*   Lipid Profile: No results for input(s): CHOL, HDL, LDLCALC, TRIG, CHOLHDL, LDLDIRECT in the last 72 hours. Thyroid Function Tests: No results for input(s): TSH, T4TOTAL, FREET4, T3FREE, THYROIDAB in the last 72 hours. Anemia Panel: No results for input(s): VITAMINB12, FOLATE, FERRITIN, TIBC, IRON, RETICCTPCT in the last 72 hours. Sepsis Labs: Recent Labs  Lab 06/19/18 1954  LATICACIDVEN 1.5    Recent Results (from the past 240 hour(s))  Surgical PCR screen     Status: None   Collection Time: 06/18/18  1:47 AM  Result Value Ref Range Status   MRSA, PCR NEGATIVE NEGATIVE Final   Staphylococcus aureus NEGATIVE NEGATIVE Final    Comment: (NOTE) The Xpert SA  Assay (FDA approved for NASAL specimens in patients 66 years of age and older), is one component of a comprehensive surveillance program. It is not intended to diagnose infection nor to guide or monitor treatment. Performed at [facility], [place] [address] [place], [place] [address]   Culture, blood (routine x 2)     Status: None   Collection Time: 06/19/18  7:54 PM  Result Value Ref Range Status   Specimen Description BLOOD LEFT ANTECUBITAL  Final   Special Requests  Final    BOTTLES DRAWN AEROBIC ONLY Blood Culture adequate volume Performed at [place] [address] [place], [place] [address]    Culture   Final    NO GROWTH 5 DAYS Performed at [facility], [place] [address] [place], [place] [address]    Report Status 06/24/2018 FINAL  Final  Culture, blood (routine x 2)     Status: None   Collection Time: 06/19/18  7:54 PM  Result Value Ref Range Status   Specimen Description   Final    BLOOD LEFT ARM Performed at [facility] [address], [place], [place] [address]    Special Requests   Final    BOTTLES DRAWN AEROBIC ONLY Blood Culture adequate volume Performed at [facility] [address], [place], [place] [address]    Culture   Final    NO GROWTH 5 DAYS Performed at [facility], [place] [address], [place], [place] [address]    Report Status 06/24/2018 FINAL  Final  Expectorated sputum assessment w rflx to resp cult     Status: None   Collection Time: 06/22/18  4:50 PM  Result Value Ref Range Status   Specimen Description SPU EXPECTORATED  Final   Special Requests NONE  Final   Sputum evaluation   Final    Sputum specimen not acceptable for testing.  Please recollect.   RESULTS CALLED TO A. PEREZ,RN [id number] @ [place] Performed at [facility], [place] [address] [place],  [address]    Report Status 06/22/2018 FINAL  Final          Radiology Studies: Dg Abd Portable 1v  Result Date: 06/25/2018 CLINICAL DATA:  Follow up small bowel obstruction EXAM: PORTABLE ABDOMEN - 1 VIEW COMPARISON:  06/23/2018 FINDINGS: Scattered large and small bowel gas is noted. Multiple dilated loops of small bowel are again identified and stable. No free air is seen. Changes of chronic pancreatitis are noted. Vascular calcifications with right iliac stenting noted. IMPRESSION: Stable small bowel dilatation Electronically Signed   By: Inez Catalina M.D.   On: 06/25/2018 07:17        Scheduled Meds: . budesonide (PULMICORT) nebulizer solution  0.5 mg Nebulization BID  . docusate sodium  100 mg Oral BID  . enoxaparin (LOVENOX) injection  30 mg Subcutaneous Q24H  . feeding supplement  1 Container Oral TID BM  . folic acid  1 mg Oral Daily  . hydrocortisone-pramoxine   Rectal QID  . insulin aspart  0-9 Units Subcutaneous Q8H  . ipratropium-albuterol  3 mL Nebulization QID  . mouth rinse  15 mL Mouth Rinse BID  . metoprolol tartrate  2.5 mg Intravenous Q8H  . thiamine  100 mg Oral Daily   Continuous Infusions: . azithromycin Stopped (06/25/18 1554)  . cefTRIAXone (ROCEPHIN)  IV 1 g (06/26/18 1304)  . methocarbamol (ROBAXIN) IV 500 mg (06/16/18 2304)  . TPN ADULT (ION) 65 mL/hr at 06/26/18 1000  . TPN ADULT (ION)       LOS: 12 days    Time spent: 35 minutes.     Elmarie Shiley, MD [facility]  If 7PM-7AM, please contact night-coverage [email or web address] Password [email or web address] 06/26/2018, 1:25 PM

## 2018-06-26 NOTE — Progress Notes (Signed)
Patient ID: [name], male   DOB: [birth year], 68 y.o.   MRN: [id number]    8 Days Post-Op  Subjective: Awake, alert and pleasant this morning.  Hungry/ wants to eat.  Denies abdominal pain.  No nausea or vomiting.  Reports passing flatus. Continues to have bowel movements.  Working with PT   Objective: Vital signs in last 24 hours: Temp:  [97.9 F (36.6 C)-98.6 F (37 C)] 98.4 F (36.9 C) (01/18 0549) Pulse Rate:  [79-90] 88 (01/18 0549) Resp:  [16-18] 18 (01/18 0549) BP: (125-181)/(73-99) 125/73 (01/18 0549) SpO2:  [96 %-100 %] 96 % (01/18 0751) Weight:  [34.4 kg] 34.4 kg (01/17 1418) Last BM Date: 06/25/18  Intake/Output from previous day: 01/17 0701 - 01/18 0700 In: 2297 [P.O.:410; I.V.:1537; IV Piggyback:350] Out: 500 [Urine:500] Intake/Output this shift: Total I/O In: 240 [P.O.:240] Out: -   PE: Gen: Awake, alert, pleasant. Frail appearing.  Heart: RRR, no obvious murmurs Lungs: CTA b/l. Normal effort. On O2.  Abd: Soft, mildly distended. Non-tender on palpation. +BS GU: Incision c/d/i. Mild edema. Right inguinal hernia reducible.   Lab Results:  Recent Labs    06/25/18 0315  WBC 9.3  HGB 10.5*  HCT 32.5*  PLT 360   BMET Recent Labs    06/25/18 0315 06/25/18 1429  NA 135 134*  K 4.2 4.4  CL 99 102  CO2 27 24  GLUCOSE 69* 176*  BUN 28* 30*  CREATININE 0.50* 0.50*  CALCIUM 8.7* 8.3*   PT/INR No results for input(s): LABPROT, INR in the last 72 hours. CMP     Component Value Date/Time   NA 134 (L) 06/25/2018 1429   NA 137 05/11/2017 1437   NA 133 (L) 10/06/2016 1220   K 4.4 06/25/2018 1429   K 4.7 10/06/2016 1220   CL 102 06/25/2018 1429   CO2 24 06/25/2018 1429   CO2 26 10/06/2016 1220   GLUCOSE 176 (H) 06/25/2018 1429   GLUCOSE 99 10/06/2016 1220   BUN 30 (H) 06/25/2018 1429   BUN 8 05/11/2017 1437   BUN 12.0 10/06/2016 1220   CREATININE 0.50 (L) 06/25/2018 1429   CREATININE 0.8 10/06/2016 1220   CALCIUM 8.3 (L) 06/25/2018 1429     CALCIUM 10.1 10/06/2016 1220   PROT 6.5 06/25/2018 1429   PROT 7.3 11/20/2014 1352   ALBUMIN 3.0 (L) 06/25/2018 1429   ALBUMIN 3.6 11/20/2014 1352   AST 44 (H) 06/25/2018 1429   AST 39 (H) 11/20/2014 1352   ALT 42 06/25/2018 1429   ALT 20 11/20/2014 1352   ALKPHOS 63 06/25/2018 1429   ALKPHOS 131 11/20/2014 1352   BILITOT 0.1 (L) 06/25/2018 1429   BILITOT 0.29 11/20/2014 1352   GFRNONAA >60 06/25/2018 1429   GFRAA >60 06/25/2018 1429   Lipase     Component Value Date/Time   LIPASE 53 (H) 06/13/2018 1924       Studies/Results: Dg Abd Portable 1v  Result Date: 06/25/2018 CLINICAL DATA:  Follow up small bowel obstruction EXAM: PORTABLE ABDOMEN - 1 VIEW COMPARISON:  06/23/2018 FINDINGS: Scattered large and small bowel gas is noted. Multiple dilated loops of small bowel are again identified and stable. No free air is seen. Changes of chronic pancreatitis are noted. Vascular calcifications with right iliac stenting noted. IMPRESSION: Stable small bowel dilatation Electronically Signed   By: [name] M.D.   On: 06/25/2018 07:17    Anti-infectives: Anti-infectives (From admission, onward)   Start     Dose/Rate Route Frequency  Ordered Stop   06/22/18 1300  cefTRIAXone (ROCEPHIN) 1 g in sodium chloride 0.9 % 100 mL IVPB     1 g 200 mL/hr over 30 Minutes Intravenous Every 24 hours 06/22/18 1202 06/27/18 1259   06/22/18 1300  azithromycin (ZITHROMAX) 500 mg in sodium chloride 0.9 % 250 mL IVPB     500 mg 250 mL/hr over 60 Minutes Intravenous Every 24 hours 06/22/18 1202 06/27/18 1259   06/18/18 0600  ceFAZolin (ANCEF) IVPB 2g/100 mL premix  Status:  Discontinued     2 g 200 mL/hr over 30 Minutes Intravenous On call to O.R. 06/18/18 0531 06/18/18 1211       Assessment/Plan Hx Non-small cell lung CA Tobacco abuse - up until admission.  COPD with acute respiratory illness - post-op - followed by Pulm Acute metabolic encephalopathy/postop delirium -fall/ongoing confusion  11/12. Neg Ct head -IV Lasix 1/10&1/11 -Medicine following Hyponatremia- resolved Hypokalemia/hypomagnesmia - resolved Leukocytosis - resolved  History of alcohol abuse Hypertension PAD/critical left limb ischemia- non healing leftleft first metatarsal head   -Vascular has seen the patient SevereMalnutrition/BMI 16.33 -prealbumin11.3  POD 7, open recurrent left inguinal hernia repair with mesh, Dr. Harlow King, 06/18/18 -Admitted 1/5 for incarcerated recurrent left inguinal hernia with small bowel obstructionw transition LLQ -Ng tube removed on 1/11 -CTA 1/13 with persistent high grade SBO. Thought to be related to edema in the incarcerated knuckle of bowel he had - was seen intraoperatively and appeared clearly viable.  -Plain film 1/15: Ongoing severe abdominal SB distension consistent with SBO -Plan film 1/17 - Continued small bowel dilatation.  -Clinically doing well. No abdominal pain, N/V, passing flatus and loose BM -Try soft diet today -Continue to mobilize  FEN: Trial of soft diet; TPN from pharmacy/nurition ID: Preop Ancef; Azithromycin & Rocephin (COPD)1/14 >> DVT: Lovenox Follow-up with Dr.Gerkin  Plan: Try to advance.   LOS: 12 days    [name] , [facility] 06/26/2018, 8:40 AM

## 2018-06-26 NOTE — Plan of Care (Signed)
Patient resting in bed this morning. States he did not rest much last night but feels good this morning. A&O and appropriate; speech clear; oxygen in place. Discussed getting out of bed and sitting in chair this morning and ambulating in hall today as well. Patient verbalizes understanding and agreement.

## 2018-06-26 NOTE — Progress Notes (Signed)
PHARMACY - ADULT TOTAL PARENTERAL NUTRITION CONSULT NOTE   Pharmacy Consult for TPN Indication: NPO for incarcerated bowel; too malnourished for surgery  Patient Measurements: Height: 5\' 2"  (157.5 cm) Weight: 75 lb 14.4 oz (34.4 kg) IBW/kg (Calculated) : 54.6 TPN AdjBW (KG): 40.5 Body mass index is 13.88 kg/m. 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 Presents this admission with SBO due to hernia, now s/p open inguinal hernia repair but CT still shows persistent SBO separate from hernia repair site - likely d/t edema/congestion from prior incarceration.  Central access:  PICC placed 1/14 TPN start date: 1/14   Significant events:  1/15: made NPO, chg MV, lytes to IV or in TPN 1/17: slight return of bowel function, advancing to clears 1/18 2 Boost/Resource Breeze supplements taken yesterday  Insulin Requirements: 8 units yesterday  Current Nutrition: TPN, trial of soft diet  IVF: none  Today, 06/26/2018:  Glucose (CBG goal 100-150) - range 109 - 150 moderate scale SSI (note low TBW). Solumedrol x1 on 1/14  Electrolytes - NA 133, K 4.5; Mag WNL at 2.  Renal - SCr stable WNL; BUN had decreased to WNL, now rising again  LFTs -  AST 44 1/17  TGs - WNL (1/15)  Prealbumin - moderately low 11.3 (1/14)  NUTRITIONAL GOALS                                                                                             RD recs: (1/13) Kcal:  1450-1650 kcal Protein:  70-85 grams Fluid:  >/= 1.4 L/day  PLAN                                                                                                                          At 1800 today:  Continue TPN at 65 ml/hr; maxing out protein to allow for reduced  dextrose  Adjusted TPN provides 84 g of protein and 1464 kcals still meeting 100% of patient needs  TPN to contain MVI and trace elements; on IV thiamine and IV FA -  changed to PO,   Electrolytes in TPN: maintain increased Na, K, Mg, others standard concentration; Cl:Ac = 2:1  Sensitive SSI, decrease from q4 to q8h CBGs  TPN lab panels on Mondays & Thursdays;  Follow for tolerability and advancement of diet- soft diet ordered today   Eudelia Bunch, Pharm.D [phone] 06/26/2018 7:38 AM

## 2018-06-27 ENCOUNTER — Inpatient Hospital Stay (HOSPITAL_COMMUNITY): Payer: Medicare Other

## 2018-06-27 LAB — CBC
HCT: 29.5 % — ABNORMAL LOW (ref 39.0–52.0)
Hemoglobin: 9.5 g/dL — ABNORMAL LOW (ref 13.0–17.0)
MCH: 31.7 pg (ref 26.0–34.0)
MCHC: 32.2 g/dL (ref 30.0–36.0)
MCV: 98.3 fL (ref 80.0–100.0)
Platelets: 346 10*3/uL (ref 150–400)
RBC: 3 MIL/uL — ABNORMAL LOW (ref 4.22–5.81)
RDW: 15 % (ref 11.5–15.5)
WBC: 11.3 10*3/uL — ABNORMAL HIGH (ref 4.0–10.5)
nRBC: 0 % (ref 0.0–0.2)

## 2018-06-27 LAB — GLUCOSE, CAPILLARY
Glucose-Capillary: 154 mg/dL — ABNORMAL HIGH (ref 70–99)
Glucose-Capillary: 161 mg/dL — ABNORMAL HIGH (ref 70–99)
Glucose-Capillary: 190 mg/dL — ABNORMAL HIGH (ref 70–99)

## 2018-06-27 LAB — BASIC METABOLIC PANEL
Anion gap: 7 (ref 5–15)
BUN: 31 mg/dL — ABNORMAL HIGH (ref 8–23)
CO2: 23 mmol/L (ref 22–32)
Calcium: 8.8 mg/dL — ABNORMAL LOW (ref 8.9–10.3)
Chloride: 105 mmol/L (ref 98–111)
Creatinine, Ser: 0.55 mg/dL — ABNORMAL LOW (ref 0.61–1.24)
GFR calc Af Amer: >60 mL/min (ref >60)
GFR calc non Af Amer: >60 mL/min (ref >60)
Glucose, Bld: 185 mg/dL — ABNORMAL HIGH (ref 70–99)
Potassium: 4.2 mmol/L (ref 3.5–5.1)
Sodium: 135 mmol/L (ref 135–145)

## 2018-06-27 MED ORDER — FUROSEMIDE 10 MG/ML IJ SOLN
40.0000 mg | Freq: Once | INTRAMUSCULAR | Status: AC
  Administered 2018-06-27: 40 mg via INTRAVENOUS
  Filled 2018-06-27: qty 4

## 2018-06-27 MED ORDER — ADULT MULTIVITAMIN W/MINERALS CH
1.0000 | ORAL_TABLET | Freq: Every day | ORAL | Status: DC
  Administered 2018-06-27 – 2018-06-28 (×2): 1 via ORAL
  Filled 2018-06-27 (×2): qty 1

## 2018-06-27 MED ORDER — TRAVASOL 10 % IV SOLN
INTRAVENOUS | Status: AC
  Administered 2018-06-27: 18:00:00 via INTRAVENOUS
  Filled 2018-06-27: qty 842.4

## 2018-06-27 MED ORDER — METOPROLOL SUCCINATE ER 50 MG PO TB24
50.0000 mg | ORAL_TABLET | Freq: Every day | ORAL | Status: DC
  Administered 2018-06-27 – 2018-06-29 (×3): 50 mg via ORAL
  Filled 2018-06-27 (×3): qty 1

## 2018-06-27 NOTE — Plan of Care (Signed)
Patient in bed this morning. Ready to get up and ambulate. Wants to sit in chair for a while after walking. No complaints of pain at this time. States he has lower abdominal cramps but no pain; feels gassy. Has several small bowel movements yesterday, but nothing overnight. Will continue to monitor.

## 2018-06-27 NOTE — Progress Notes (Signed)
Respiratory care called for treatment during the night.  Patient has on going congestion and cough with SOB.  Pt assessed and treated  With neb tx..  VSS.  Received Sitz bath this morning and peri care with gown change and linen change.  No BM during the night but had small BM 06/26/18.

## 2018-06-27 NOTE — Progress Notes (Signed)
Assessment & Plan: POD#9 status post open repair incarcerated LIH with mesh, release of small bowel obstruction  Family and hospitalist at bedside  Only one small BM since diet resumed, denies nausea or emesis  Mild abdominal distension  Will repeat AXR this AM and review  TNA for nutritional support Appreciate medical service management of multiple medical issues         Armandina Gemma, MD       [facility]       Office: [phone]   Chief Complaint: Incarcerated LIH with SBO  Subjective: Patient in bed, family at bedside.  Dr. Tyrell Antonio in room.  Passing flatus, no BM's.  Objective: Vital signs in last 24 hours: Temp:  [97.5 F (36.4 C)-97.8 F (36.6 C)] 97.8 F (36.6 C) (01/18 2217) Pulse Rate:  [78-90] 90 (01/18 2217) Resp:  [16] 16 (01/18 2217) BP: (125-182)/(73-82) 161/79 (01/18 2217) SpO2:  [93 %-100 %] 99 % (01/19 0736) Last BM Date: 06/25/18  Intake/Output from previous day: 01/18 0701 - 01/19 0700 In: 3718.2 [P.O.:1790; I.V.:1578.2; IV Piggyback:350] Out: 1775 [Urine:1775] Intake/Output this shift: Total I/O In: 180 [P.O.:180] Out: 150 [Urine:150]  Physical Exam: HEENT - sclerae clear, mucous membranes moist Neck - soft Abdomen - protuberant, soft, non-tender; few BS present; left inguinal wound healing well, no recurrence Neuro - alert & oriented, no focal deficits  Lab Results:  Recent Labs    06/25/18 0315 06/26/18 0828  WBC 9.3 8.9  HGB 10.5* 9.7*  HCT 32.5* 30.0*  PLT 360 326   BMET Recent Labs    06/25/18 1429 06/26/18 0828  NA 134* 133*  K 4.4 4.5  CL 102 103  CO2 24 22  GLUCOSE 176* 173*  BUN 30* 30*  CREATININE 0.50* 0.56*  CALCIUM 8.3* 8.7*   PT/INR No results for input(s): LABPROT, INR in the last 72 hours. Comprehensive Metabolic Panel:    Component Value Date/Time   NA 133 (L) 06/26/2018 0828   NA 134 (L) 06/25/2018 1429   NA 137 05/11/2017 1437   NA 133 (L) 10/06/2016 1220   NA 139  09/01/2016 1516   NA 135 (L) 11/20/2014 1352   K 4.5 06/26/2018 0828   K 4.4 06/25/2018 1429   K 4.7 10/06/2016 1220   K 4.5 11/20/2014 1352   CL 103 06/26/2018 0828   CL 102 06/25/2018 1429   CO2 22 06/26/2018 0828   CO2 24 06/25/2018 1429   CO2 26 10/06/2016 1220   CO2 22 11/20/2014 1352   BUN 30 (H) 06/26/2018 0828   BUN 30 (H) 06/25/2018 1429   BUN 8 05/11/2017 1437   BUN 12.0 10/06/2016 1220   BUN 17 09/01/2016 1516   BUN 10.9 11/20/2014 1352   CREATININE 0.56 (L) 06/26/2018 0828   CREATININE 0.50 (L) 06/25/2018 1429   CREATININE 0.8 10/06/2016 1220   CREATININE 0.8 11/20/2014 1352   GLUCOSE 173 (H) 06/26/2018 0828   GLUCOSE 176 (H) 06/25/2018 1429   GLUCOSE 99 10/06/2016 1220   GLUCOSE 100 11/20/2014 1352   CALCIUM 8.7 (L) 06/26/2018 0828   CALCIUM 8.3 (L) 06/25/2018 1429   CALCIUM 10.1 10/06/2016 1220   CALCIUM 9.3 11/20/2014 1352   AST 44 (H) 06/25/2018 1429   AST 46 (H) 06/24/2018 0436   AST 39 (H) 11/20/2014 1352   ALT 42 06/25/2018 1429   ALT 31 06/24/2018 0436   ALT 20 11/20/2014 1352   ALKPHOS 63 06/25/2018 1429   ALKPHOS 54  06/24/2018 0436   ALKPHOS 131 11/20/2014 1352   BILITOT 0.1 (L) 06/25/2018 1429   BILITOT 0.6 06/24/2018 0436   BILITOT 0.29 11/20/2014 1352   PROT 6.5 06/25/2018 1429   PROT 5.8 (L) 06/24/2018 0436   PROT 7.3 11/20/2014 1352   ALBUMIN 3.0 (L) 06/25/2018 1429   ALBUMIN 2.9 (L) 06/24/2018 0436   ALBUMIN 3.6 11/20/2014 1352    Studies/Results: No results found.    Armandina Gemma 06/27/2018  Patient ID: [name], male   DOB: [birth year], 67 y.o.   MRN: [id number]

## 2018-06-27 NOTE — Progress Notes (Signed)
PHARMACY - ADULT TOTAL PARENTERAL NUTRITION CONSULT NOTE   Pharmacy Consult for TPN Indication: NPO for incarcerated bowel; too malnourished for surgery  Patient Measurements: Height: 5\' 2"  (157.5 cm) Weight: 75 lb 14.4 oz (34.4 kg) IBW/kg (Calculated) : 54.6 TPN AdjBW (KG): 40.5 Body mass index is 13.88 kg/m. 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 Presents this admission with SBO due to hernia, now s/p open inguinal hernia repair but CT still shows persistent SBO separate from hernia repair site - likely d/t edema/congestion from prior incarceration.  Central access:  PICC placed 1/14 TPN start date: 1/14   Significant events:  1/15: made NPO, chg MV, lytes to IV or in TPN 1/17: slight return of bowel function, advancing to clears 1/18 2 Boost/Resource Breeze supplements taken yesterday 1/19 100% 2 meals , 75 % 1 meal and 0% 2 meals recorded. 2 boost/resource breeze supplements charted, KUB ordered by CCS  Insulin Requirements: 6 units SSI yesterday  Current Nutrition: TPN, soft diet  IVF: none  Today, 06/27/2018:  Glucose (CBG goal 100-150) - range 150-175 moderate scale SSI (note low TBW). Solumedrol x1 on 1/14  Electrolytes - no labs today  Renal - SCr stable WNL; BUN had decreased to WNL, now rising again to 30 1/17 and 1/18  LFTs -  AST 44 1/17  TGs - WNL (1/15)  Prealbumin - moderately low 11.3 (1/14)  NUTRITIONAL GOALS                                                                                             RD recs: (1/13) Kcal:  1450-1650 kcal Protein:  70-85 grams Fluid:  >/= 1.4 L/day  PLAN  At 1800 today:  Continue TPN at 65 ml/hr; maxing out protein to allow for reduced dextrose  Adjusted TPN provides 84 g of protein and 1464 kcals still meeting 100% of patient needs  Change MVI to PO,   Electrolytes in TPN: maintain increased Na, K, Mg, others standard concentration; Cl:Ac = 2:1  Sensitive SSI, decrease from q4 to q8h CBGs  TPN lab panels on Mondays & Thursdays  F/u KUB, ability to wean TPN and advance of diet   Eudelia Bunch, Pharm.D [phone] 06/27/2018 7:15 AM

## 2018-06-27 NOTE — Progress Notes (Signed)
PROGRESS NOTE    [name]  ENI:[id number] DOB: [birth year] DOA: 06/13/2018 PCP: [name], MD    Brief Narrative: 68 year old with past medical history significant for COPD not on home oxygen, peripheral vascular disease a status post DES in 03/2018, history of non-small cell lung cancer status post radiation who presents with nausea vomiting found to have SBO from inguinal hernia. Patient was cleared by cardiology for surgery.  Patient underwent left inguinal hernia repair with mesh on June 18, 2018 Dr. Harle Stanford  Assessment & Plan:   Principal Problem:   Incarcerated left inguinal hernia s/p repair 06/18/2018 Active Problems:   Smoker   COPD (chronic obstructive pulmonary disease) ([place])   Essential hypertension   Protein-calorie malnutrition, severe (HCC)   Severe claudication (HCC)   PAD (peripheral artery disease) ([place])   Acute hypoxemic respiratory failure (HCC)   Acute metabolic encephalopathy   SBO (small bowel obstruction) ([place])   Inguinal hernia of left side with obstruction and without gangrene   1-SBO 2/2 left inguinal hernia --On general surgery service, Plavix held for 5 days, he is brought to the OR  On 1/10 and underwent OPEN RECURRENT LEFT INGUINAL HERNIA REPAIR WITH MESH (Left), INSERTION OF MESH (Left) -general surgery oked to Resume plavix  -he is started on TPN on 1/14 due to persistent ileus, hold all oral meds --plan per general surgery Per CT angios on January 13 there is persistent high-grade small bowel obstruction KUB 1-15 persistent dilation SBO.  On soft diet, passing gas. Abdomen more distended,  no BM. Plan to repeat KUB   Postop hypoxia  -Patient has poor pulmonary reserve due to advanced COPD, continued smoker,  status post abdomen surgery, with increased confusion, at risk of decompensation, he is transferred to stepdown unit on 1/10 -stat chest x-ray obtained on 1/10 showed "Small new bilateral pleural effusions with probable atelectasis  at the left base" he received lasix 20mg  iv x1 on 1/10 and 20mg  iv x1 on 1/11 with good diruesis, he received another dose of lasix 40mg  on 1/14. Will likely need lasix prn while on TPN. -critical care consulted, input appreciated --persistent hypoxia, girlfriends reports this is new,  CTA chest no PE, does show mucus plugging and infiltrates, he start to have diffuse wheezing on 1/14, will treat as COPD exacerbation, start steroids, increase nebs, chest PT, start rocephin/zithro , plan for 5 days abx treatment. Discussed with CCM , steroid stop.  Respiratory  toilet. stable.  Congested overnight. IV lasix one time dose.   Acute metabolic encephalopathy/delirium on 1/10: Likely multifactorial , alcohol withdrawal? Hyponatremia, surgery with anesthesia, inhospital delirium -per family patient has h/o confusion in the past during hospitalization -ct head no acute findings, lactic acid wnl, blood culture no growth - avoid dilaudid ,ativan , d/c tele -low dose zyprexa started on 1/12, girlfriend states it made him more agitated.  Avoid Zyprexa -patient was very sleepy on my initial evaluation. CBG was normal. Vitals stable. Subsequently he wake up.  -alert   H/o internal hemorrhoids -Seen by Dr. Thana Farr in April 2019, s/p rubber band ligation  -Blood per rectum this morning, denies abdominal pain -Defer to general surgery  Hyponatremia: Continue with IV fluids.   Hypokalemia:   resolved/.   Hypomagnesemia:mg normal.   COPD/heavy smoker -Not on home O2 -Likely will benefit O2 at discharge ( per RN prior to surgery patient o2 dropped to the 70's on room air  while Ambulating with one person  Assist to the bathroom) -need outpatient follow  up for COPD  Left upper lobe lung cancer  Lung nodules x2 left upper lobe and 1 left lower lobe s/p XRT -Case discussed with radiation oncology Dr. Tammi Klippel who stated patient has stage Ia status post curative intent treatment XRT finished in July  2019 -Per Dr. Tammi Klippel patient missed his restaging scan, Dr. Tammi Klippel recommended CT chest with IV contrast prior to discharge which is done on 1/13 -pa and follow with him every 32-month.   PAD s/p stent -PAD s/pSuccessful self expanding Eluvia 7.0 X 60 mm stent placement Rt external iliac arteryon 03/30/2018 by cardiology Dr Virgina Jock,  -Has an appointment with vascular surgery Dr Donnetta Hutching on 2/11 -Girl friend reports patient has an appointment with Dr Donnetta Hutching on 1/6 however patient was admitted to the hospital Girl friend reports patient has increase pain on left leg while walking, she request vascular being consulted while patient is in the hospital, will contact vascular surgery. -vascular surgery Dr Scot Dock input appreciated, patient underwent CT angiogram, per Dr Scot Dock think patient likely will need bypass surgery, but do not think he could tolerate major vascular surgery given his respiratory status, severely debilitated state, and severe protein calorie malnutrition., recommend outpatient follow up with him.  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 Nutrition consult on TPN/       Nutrition Problem: Severe Malnutrition Etiology: chronic illness(COPD)    Signs/Symptoms: energy intake < or equal to 75% for > or equal to 1 month, severe fat depletion, severe muscle depletion, percent weight loss    Interventions: MVI, Carn[employer] Instant Breakfast  Estimated body mass index is 13.88 kg/m as calculated from the following:   Height as of this encounter: 5\' 2"  (1.575 m).   Weight as of this encounter: 34.4 kg.   DVT prophylaxis: Lovenox Code Status: Full code Family  Communication; care discussed with patient Disposition Plan: Patient with persistent anemia, requiring IV TPN.  Consultants:   General surgery as primary  hospitalist as consultant  Cardiology as consultant  Pulmonary/critical care   Procedures:  Ng placement and removal  on 1/10 s/p OPEN RECURRENT LEFT INGUINAL HERNIA REPAIR WITH MESH (Left), INSERTION OF MESH (Left) TPN    Antimicrobials:   Perioperative  Rocephin/zithro from 1/14 , plan for 5 days   Subjective: breathing better.  Walk in the hall today.   Objective: Vitals:   06/26/18 2217 06/27/18 0128 06/27/18 0728 06/27/18 0736  BP: (!) 161/79     Pulse: 90     Resp: 16     Temp: 97.8 F (36.6 C)     TempSrc: Oral     SpO2: 100% 95% 99% 99%  Weight:      Height:        Intake/Output Summary (Last 24 hours) at 06/27/2018 0930 Last data filed at 06/27/2018 0900 Gross per 24 hour  Intake 3658.22 ml  Output 1700 ml  Net 1958.22 ml   Filed Weights   06/18/18 1437 06/22/18 0845 06/25/18 1418  Weight: 40.5 kg 38 kg 34.4 kg    Examination:  General exam: NAD Respiratory system: CTA Cardiovascular system: S 1, S 2 RRR Gastrointestinal system: BS present, soft, nt. Incision inguinal area healing Central nervous system; alert Extremities: Symmetric power.  Skin:  no rashes    Data Reviewed: I have personally reviewed following labs and imaging studies  CBC: Recent Labs  Lab 06/21/18 0427 06/22/18 0520 06/23/18 0501 06/25/18 0315 06/26/18 0828  WBC 14.7* 15.5* 9.1 9.3 8.9  NEUTROABS  --   --  8.1*  --   --   HGB 11.5* 11.5* 10.0* 10.5* 9.7*  HCT 34.3* 35.1* 30.3* 32.5* 30.0*  MCV 96.3 94.9 96.5 97.6 98.7  PLT 300 349 338 360 637   Basic Metabolic Panel: Recent Labs  Lab 06/22/18 0520 06/23/18 0501 06/24/18 0436 06/25/18 0315 06/25/18 1429 06/26/18 0422 06/26/18 0828  NA 136 136 133* 135 134*  --  133*  K 3.3* 3.9 3.7 4.2 4.4  --  4.5  CL 92* 93* 95* 99 102  --  103  CO2  35* 34* 32 27 24  --  22  GLUCOSE 164* 182* 193* 69* 176*  --  173*  BUN 10 16 25* 28* 30*  --  30*  CREATININE 0.62 0.56* 0.57* 0.50* 0.50*  --  0.56*  CALCIUM 8.6* 8.5* 8.6* 8.7* 8.3*  --  8.7*  MG 1.6* 2.0 1.6* 2.2  --  2.0  --   PHOS 2.2* 2.5 2.8 3.1  --   --   --    GFR: Estimated Creatinine Clearance: 44.2 mL/min (A) (by C-G formula based on SCr of 0.56 mg/dL (L)). Liver Function Tests: Recent Labs  Lab 06/22/18 0520 06/23/18 0501 06/24/18 0436 06/25/18 1429  AST 32 29 46* 44*  ALT 20 21 31  42  ALKPHOS 57 56 54 63  BILITOT 0.8 0.5 0.6 0.1*  PROT 6.1* 5.8* 5.8* 6.5  ALBUMIN 3.0* 2.7* 2.9* 3.0*   No results for input(s): LIPASE, AMYLASE in the last 168 hours. No results for input(s): AMMONIA in the last 168 hours. Coagulation Profile: No results for input(s): INR, PROTIME in the last 168 hours. Cardiac Enzymes: No results for input(s): CKTOTAL, CKMB, CKMBINDEX, TROPONINI in the last 168 hours. BNP (last 3 results) No results for input(s): PROBNP in the last 8760 hours. HbA1C: No results for input(s): HGBA1C in the last 72 hours. CBG: Recent Labs  Lab 06/26/18 0011 06/26/18 0430 06/26/18 1402 06/26/18 2212 06/27/18 0604  GLUCAP 154* 143* 150* 175* 154*   Lipid Profile: No results for input(s): CHOL, HDL, LDLCALC, TRIG, CHOLHDL, LDLDIRECT in the last 72 hours. Thyroid Function Tests: No results for input(s): TSH, T4TOTAL, FREET4, T3FREE, THYROIDAB in the last 72 hours. Anemia Panel: No results for input(s): VITAMINB12, FOLATE, FERRITIN, TIBC, IRON, RETICCTPCT in the last 72 hours. Sepsis Labs: No results for input(s): PROCALCITON, LATICACIDVEN in the last 168 hours.  Recent Results (from the past 240 hour(s))  Surgical PCR screen     Status: None   Collection Time: 06/18/18  1:47 AM  Result Value Ref Range Status   MRSA, PCR NEGATIVE NEGATIVE Final   Staphylococcus aureus NEGATIVE NEGATIVE Final    Comment: (NOTE) The Xpert SA Assay (FDA approved for  NASAL specimens in patients 91 years of age and older), is one component of a comprehensive surveillance program. It is not intended to diagnose infection nor to guide or monitor treatment. Performed at [facility], [place] [address], [place], [place] [address]   Culture, blood (routine x 2)     Status: None   Collection Time: 06/19/18  7:54 PM  Result Value Ref Range Status   Specimen Description BLOOD LEFT ANTECUBITAL  Final   Special Requests  Final    BOTTLES DRAWN AEROBIC ONLY Blood Culture adequate volume Performed at [facility] [address] [place], [place] [address]    Culture   Final    NO GROWTH 5 DAYS Performed at [facility], [place] [address], [place], [place] [address]    Report Status 06/24/2018 FINAL  Final  Culture, blood (routine x 2)     Status: None   Collection Time: 06/19/18  7:54 PM  Result Value Ref Range Status   Specimen Description   Final    BLOOD LEFT ARM Performed at [facility] [address], [place], [place] [address]    Special Requests   Final    BOTTLES DRAWN AEROBIC ONLY Blood Culture adequate volume Performed at [facility] [address] [place], [place] [address]    Culture   Final    NO GROWTH 5 DAYS Performed at [facility], [place] [address], [place], [place] [address]    Report Status 06/24/2018 FINAL  Final  Expectorated sputum assessment w rflx to resp cult     Status: None   Collection Time: 06/22/18  4:50 PM  Result Value Ref Range Status   Specimen Description SPU EXPECTORATED  Final   Special Requests NONE  Final   Sputum evaluation   Final    Sputum specimen not acceptable for testing.  Please recollect.   RESULTS CALLED TO A. PEREZ,RN [id number] @ [place] Performed at [facility], [place] [address] [place], [place] [address]    Report Status 06/22/2018 FINAL  Final         Radiology Studies: No  results found.      Scheduled Meds: . budesonide (PULMICORT) nebulizer solution  0.5 mg Nebulization BID  . docusate sodium  100 mg Oral BID  . enoxaparin (LOVENOX) injection  30 mg Subcutaneous Q24H  . feeding supplement  1 Container Oral TID BM  . folic acid  1 mg Oral Daily  . hydrocortisone-pramoxine   Rectal QID  . insulin aspart  0-9 Units Subcutaneous Q8H  . ipratropium-albuterol  3 mL Nebulization QID  . mouth rinse  15 mL Mouth Rinse BID  . metoprolol succinate  50 mg Oral Daily  . thiamine  100 mg Oral Daily   Continuous Infusions: . methocarbamol (ROBAXIN) IV 500 mg (06/16/18 2304)  . TPN ADULT (ION) 65 mL/hr at 06/26/18 1800     LOS: 13 days    Time spent: 35 minutes.     Elmarie Shiley, MD [facility]  If 7PM-7AM, please contact night-coverage [email or web address] Password [email or web address] 06/27/2018, 9:30 AM

## 2018-06-28 LAB — DIFFERENTIAL
ABS IMMATURE GRANULOCYTES: 0.08 10*3/uL — AB (ref 0.00–0.07)
Basophils Absolute: 0 10*3/uL (ref 0.0–0.1)
Basophils Relative: 0 %
Eosinophils Absolute: 0.1 10*3/uL (ref 0.0–0.5)
Eosinophils Relative: 2 %
Immature Granulocytes: 1 %
LYMPHS PCT: 9 %
Lymphs Abs: 0.7 10*3/uL (ref 0.7–4.0)
Monocytes Absolute: 0.6 10*3/uL (ref 0.1–1.0)
Monocytes Relative: 7 %
NEUTROS ABS: 6.5 10*3/uL (ref 1.7–7.7)
Neutrophils Relative %: 81 %

## 2018-06-28 LAB — TRIGLYCERIDES: Triglycerides: 57 mg/dL (ref <150)

## 2018-06-28 LAB — GLUCOSE, CAPILLARY
GLUCOSE-CAPILLARY: 166 mg/dL — AB (ref 70–99)
Glucose-Capillary: 116 mg/dL — ABNORMAL HIGH (ref 70–99)
Glucose-Capillary: 199 mg/dL — ABNORMAL HIGH (ref 70–99)
Glucose-Capillary: 180 mg/dL — ABNORMAL HIGH (ref 70–99)
Glucose-Capillary: 200 mg/dL — ABNORMAL HIGH (ref 70–99)

## 2018-06-28 LAB — COMPREHENSIVE METABOLIC PANEL
ALT: 58 U/L — AB (ref 0–44)
AST: 58 U/L — ABNORMAL HIGH (ref 15–41)
Albumin: 3 g/dL — ABNORMAL LOW (ref 3.5–5.0)
Alkaline Phosphatase: 78 U/L (ref 38–126)
Anion gap: 7 (ref 5–15)
BUN: 34 mg/dL — ABNORMAL HIGH (ref 8–23)
CO2: 22 mmol/L (ref 22–32)
Calcium: 8.9 mg/dL (ref 8.9–10.3)
Chloride: 106 mmol/L (ref 98–111)
Creatinine, Ser: 0.55 mg/dL — ABNORMAL LOW (ref 0.61–1.24)
GFR calc Af Amer: >60 mL/min (ref >60)
GFR calc non Af Amer: >60 mL/min (ref >60)
Glucose, Bld: 182 mg/dL — ABNORMAL HIGH (ref 70–99)
Potassium: 4.8 mmol/L (ref 3.5–5.1)
Sodium: 135 mmol/L (ref 135–145)
Total Bilirubin: 0.2 mg/dL — ABNORMAL LOW (ref 0.3–1.2)
Total Protein: 6.2 g/dL — ABNORMAL LOW (ref 6.5–8.1)

## 2018-06-28 LAB — CBC
HCT: 28.4 % — ABNORMAL LOW (ref 39.0–52.0)
Hemoglobin: 9.2 g/dL — ABNORMAL LOW (ref 13.0–17.0)
MCH: 31.5 pg (ref 26.0–34.0)
MCHC: 32.4 g/dL (ref 30.0–36.0)
MCV: 97.3 fL (ref 80.0–100.0)
Platelets: 363 10*3/uL (ref 150–400)
RBC: 2.92 MIL/uL — AB (ref 4.22–5.81)
RDW: 15.3 % (ref 11.5–15.5)
WBC: 8 10*3/uL (ref 4.0–10.5)
nRBC: 0 % (ref 0.0–0.2)

## 2018-06-28 LAB — PHOSPHORUS: Phosphorus: 4.5 mg/dL (ref 2.5–4.6)

## 2018-06-28 LAB — MAGNESIUM: Magnesium: 2.2 mg/dL (ref 1.7–2.4)

## 2018-06-28 LAB — PREALBUMIN: Prealbumin: 22.1 mg/dL (ref 18–38)

## 2018-06-28 MED ORDER — IPRATROPIUM-ALBUTEROL 0.5-2.5 (3) MG/3ML IN SOLN
3.0000 mL | Freq: Three times a day (TID) | RESPIRATORY_TRACT | Status: DC
  Administered 2018-06-28 – 2018-06-30 (×5): 3 mL via RESPIRATORY_TRACT
  Filled 2018-06-28 (×5): qty 3

## 2018-06-28 MED ORDER — TRAVASOL 10 % IV SOLN
INTRAVENOUS | Status: AC
  Administered 2018-06-28: 18:00:00 via INTRAVENOUS
  Filled 2018-06-28: qty 842.4

## 2018-06-28 NOTE — Progress Notes (Signed)
Nutrition Follow-up  DOCUMENTATION CODES:   Underweight, Severe malnutrition in context of chronic illness  INTERVENTION:   TPN per Pharmacy  48 hour Calorie Count 1/20-1/22  -Continue Magic cup TID with meals, each supplement provides 290 kcal and 9 grams of protein -Continue Boost Breeze po TID, each supplement provides 250 kcal and 9 grams of protein  NUTRITION DIAGNOSIS:   Severe Malnutrition related to chronic illness(COPD) as evidenced by energy intake < or equal to 75% for > or equal to 1 month, severe fat depletion, severe muscle depletion, percent weight loss.  Ongoing.  GOAL:   Patient will meet greater than or equal to 90% of their needs  Progressing.  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 Presents this admission with SBO due to hernia.   1/10- open inguinal hernia repair  1/12- clear liquid diet 1/13- full liquid diet 1/14 -made NPO, started on TPN, PICC place 1/15: TPN advanced to goal rate of 65 ml/hr. 1/18: diet advanced to soft   48 hour Calorie Count initiated today. RD hung envelope on door.   Patient attempting to eat lunch today during visit. Pt eating small bites of toast with butter and jelly. He is sipping on whole milk as well. Pt states he is feeling overwhelmed by the pressure to eat and how much food he is being told to eat. Encouraged pt to take his time, eat small, frequent meals if needed.   TPN continues to infuse at 65 ml/hr providing 1464 kcal and 84g protein.  Patient's weight has continued to drop, today his weight is recorded as 75 lb. Pt has lost 14 lb since admissin 1/6. Will increase his estimated  needs.  Medications: Colace capsule BID, Folic acid tablet daily, Multivitamin with minerals daily, thiamine tablet daily Labs reviewed: CBGs: 116-199  Diet Order:   Diet Order            DIET SOFT Room service appropriate? Yes; Fluid consistency: Thin  Diet effective now              EDUCATION NEEDS:   Education needs have been addressed  Skin:  Skin Assessment: Reviewed RN Assessment  Last BM:  1/20  Height:   Ht Readings from Last 1 Encounters:  06/18/18 5\' 2"  (1.575 m)    Weight:   Wt Readings from Last 1 Encounters:  06/25/18 34.4 kg    Ideal Body Weight:  53.6 kg  BMI:  Body mass index is 13.88 kg/m.  Estimated Nutritional Needs:   Kcal:  1500-1700  Protein:  75-90g  Fluid:  1.5L/day  [name], MS, RD, LDN [facility] Dietitian Pager: [phone] After Hours Pager: [phone]

## 2018-06-28 NOTE — Progress Notes (Signed)
PHARMACY - ADULT TOTAL PARENTERAL NUTRITION CONSULT NOTE   Pharmacy Consult for TPN Indication: NPO for incarcerated bowel; too malnourished for surgery  Patient Measurements: Height: 5\' 2"  (157.5 cm) Weight: 75 lb 14.4 oz (34.4 kg) IBW/kg (Calculated) : 54.6 TPN AdjBW (KG): 40.5 Body mass index is 13.88 kg/m. 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 Presents this admission with SBO due to hernia, now s/p open inguinal hernia repair but CT still shows persistent SBO separate from hernia repair site - likely d/t edema/congestion from prior incarceration.  Central access:  PICC placed 1/14 TPN start date: 1/14   Significant events:  1/15: made NPO, chg MV, lytes to IV or in TPN 1/17: slight return of bowel function, advancing to clears 1/18 2 Boost/Resource Breeze supplements taken yesterday 1/19 100% 2 meals , 75 % 1 meal and 0% 2 meals recorded. 2 boost/resource breeze supplements charted, KUB ordered by CCS 1/20: multiple BMs reported, still with poor PO intake but some improvement, GF reports poor intake at baseline  Insulin Requirements: 6 units SSI yesterday  Current Nutrition: TPN, soft diet  IVF: none  Today, 06/28/2018:  Glucose (CBG goal 100-150) - range 116-190 moderate scale SSI (note low TBW). Solumedrol x1 on 1/14  Electrolytes - WNL, phos at upper limit of normal  Renal - SCr stable WNL; BUN had decreased to WNL, now rising again  LFTs -  AST 58  TGs - WNL   Prealbumin - WNL  NUTRITIONAL GOALS                                                                                             RD recs: (1/13) Kcal:  1450-1650 kcal Protein:  70-85 grams Fluid:  >/= 1.4 L/day  PLAN  At 1800 today:  Continue TPN at 65 ml/hr; maxing out protein to allow for reduced dextrose  Adjusted TPN provides 84 g of protein and 1464 kcals still meeting 100% of patient needs  Continue PO MVI,   Electrolytes in TPN: maintain increased Na, K (decreased 1/18), Mg, others standard concentration but will decrease phos to 10 mmol/L; Cl:Ac = 2:1  Sensitive SSI, q8h CBGs  Recheck BMET, Mg, and phos tomorrow   TPN lab panels on Mondays & Thursdays  F/u ability to wean TPN and advance of diet  Ulice Dash, PharmD Clinical Pharmacist Pager # [phone]  06/28/2018 9:45 AM

## 2018-06-28 NOTE — Progress Notes (Signed)
PROGRESS NOTE    [name]  XFG:[id number] DOB: 11-17-1950 DOA: 06/13/2018 PCP: [name], MD    Brief Narrative: 68 year old with past medical history significant for COPD not on home oxygen, peripheral vascular disease a status post DES in 03/2018, history of non-small cell lung cancer status post radiation who presents with nausea vomiting found to have SBO from inguinal hernia. Patient was cleared by cardiology for surgery.  Patient underwent left inguinal hernia repair with mesh on June 18, 2018 Dr. Harle Stanford  Assessment & Plan:   Principal Problem:   Incarcerated left inguinal hernia s/p repair 06/18/2018 Active Problems:   Smoker   COPD (chronic obstructive pulmonary disease) ([place])   Essential hypertension   Protein-calorie malnutrition, severe (HCC)   Severe claudication (HCC)   PAD (peripheral artery disease) ([place])   Acute hypoxemic respiratory failure (HCC)   Acute metabolic encephalopathy   SBO (small bowel obstruction) ([place])   Inguinal hernia of left side with obstruction and without gangrene   1-SBO 2/2 left inguinal hernia --On general surgery service, Plavix held for 5 days, he is brought to the OR  On 1/10 and underwent OPEN RECURRENT LEFT INGUINAL HERNIA REPAIR WITH MESH (Left), INSERTION OF MESH (Left) -general surgery oked to Resume plavix  -he is started on TPN on 1/14 due to persistent ileus, hold all oral meds --plan per general surgery Per CT angios on January 13 there is persistent high-grade small bowel obstruction KUB 1-15 persistent dilation SBO.  KUB stable, gas colon, partial obstruction. He is improving clinically. He had multiple BM>  Continue with soft diet. Nutrition consulted. Will defer to sx weaning TPN    Postop hypoxia  -Patient has poor pulmonary reserve due to advanced COPD, continued smoker,  status post abdomen surgery, with increased confusion, at risk of decompensation, he is transferred to stepdown unit on 1/10 -stat chest  x-ray obtained on 1/10 showed "Small new bilateral pleural effusions with probable atelectasis at the left base" he received lasix 20mg  iv x1 on 1/10 and 20mg  iv x1 on 1/11 with good diruesis, he received another dose of lasix 40mg  on 1/14. Will likely need lasix prn while on TPN. -critical care consulted, input appreciated --persistent hypoxia, girlfriends reports this is new,  CTA chest no PE, does show mucus plugging and infiltrates, he start to have diffuse wheezing on 1/14, will treat as COPD exacerbation, start steroids, increase nebs, chest PT, start rocephin/zithro , plan for 5 days abx treatment. Discussed with CCM , steroid stop.  Stable. Received IV lasix 1-19.  Acute metabolic encephalopathy/delirium on 1/10: Likely multifactorial , alcohol withdrawal? Hyponatremia, surgery with anesthesia, inhospital delirium -per family patient has h/o confusion in the past during hospitalization -ct head no acute findings, lactic acid wnl, blood culture no growth - avoid dilaudid ,ativan , d/c tele -low dose zyprexa started on 1/12, girlfriend states it made him more agitated.  Avoid Zyprexa -alert. Answering questions.   H/o internal hemorrhoids -Seen by Dr. Thana Farr in April 2019, s/p rubber band ligation  -Blood per rectum this morning, denies abdominal pain -Defer to general surgery  Hyponatremia: Continue with IV fluids.   Hypokalemia:   resolved/.   Hypomagnesemia:mg normal.   COPD/heavy smoker -Not on home O2 -Likely will benefit O2 at discharge ( per RN prior to surgery patient o2 dropped to the 70's on room air  while Ambulating with one person  Assist to the bathroom) -need outpatient follow up for COPD  Left upper lobe lung cancer  Lung  nodules x2 left upper lobe and 1 left lower lobe s/p XRT -Case discussed with radiation oncology Dr. Tammi Klippel who stated patient has stage Ia status post curative intent treatment XRT finished in July 2019 -Per Dr. Tammi Klippel patient missed  his restaging scan, Dr. Tammi Klippel recommended CT chest with IV contrast prior to discharge which is done on 1/13 -pa and follow with him every 50-month.   PAD s/p stent -PAD s/pSuccessful self expanding Eluvia 7.0 X 60 mm stent placement Rt external iliac arteryon 03/30/2018 by cardiology Dr Virgina Jock,  -Has an appointment with vascular surgery Dr Donnetta Hutching on 2/11 -Girl friend reports patient has an appointment with Dr Donnetta Hutching on 1/6 however patient was admitted to the hospital Girl friend reports patient has increase pain on left leg while walking, she request vascular being consulted while patient is in the hospital, will contact vascular surgery. -vascular surgery Dr Scot Dock input appreciated, patient underwent CT angiogram, per Dr Scot Dock think patient likely will need bypass surgery, but do not think he could tolerate major vascular surgery given his respiratory status, severely debilitated state, and severe protein calorie malnutrition., recommend outpatient follow up with him.  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 Nutrition consulted.  on TPN/       Nutrition Problem: Severe Malnutrition Etiology: chronic illness(COPD)    Signs/Symptoms: energy intake < or equal to 75% for > or equal to 1 month, severe fat depletion, severe muscle depletion, percent weight loss    Interventions: TPN  Estimated body mass index is 13.88 kg/m as calculated from the following:   Height as of this encounter: 5\' 2"  (1.575 m).   Weight as of this encounter: 34.4 kg.   DVT prophylaxis: Lovenox Code Status: Full code Family Communication; care discussed with patient Disposition Plan: Patient  with persistent anemia, requiring IV TPN.  Consultants:   General surgery as primary  hospitalist as consultant  Cardiology as consultant  Pulmonary/critical care   Procedures:  Ng placement and removal  on 1/10 s/p OPEN RECURRENT LEFT INGUINAL HERNIA REPAIR WITH MESH (Left), INSERTION OF MESH (Left) TPN    Antimicrobials:   Perioperative  Rocephin/zithro from 1/14 , plan for 5 days   Subjective: He had multiples BM.  Couldn't sleep last night.  Denies abdominal pain.  Had multiples BM   Objective: Vitals:   06/28/18 0704 06/28/18 0929 06/28/18 1121 06/28/18 1339  BP:  (!) 149/82  (!) 144/82  Pulse: 84 86 84 78  Resp: 17  18 17   Temp:      TempSrc:      SpO2: 96%  94% 97%  Weight:      Height:        Intake/Output Summary (Last 24 hours) at 06/28/2018 1415 Last data filed at 06/28/2018 1000 Gross per 24 hour  Intake 1505.27 ml  Output 625 ml  Net 880.27 ml   Filed Weights   06/18/18 1437 06/22/18 0845 06/25/18 1418  Weight: 40.5 kg 38 kg 34.4 kg    Examination:  General exam: NAD Respiratory system: CTA Cardiovascular system: S 1, S 2 RRR Gastrointestinal system: BS present, soft,  Incision inguinal area healing Central nervous system; alert, answer questions.  Extremities: Symmetric power.  Skin: no rashes.  Data Reviewed: I have personally reviewed following labs and imaging studies  CBC: Recent Labs  Lab 06/23/18 0501 06/25/18 0315 06/26/18 0828 06/27/18 1001 06/28/18 0447  WBC 9.1 9.3 8.9 11.3* 8.0  NEUTROABS 8.1*  --   --   --  6.5  HGB 10.0* 10.5* 9.7* 9.5* 9.2*  HCT 30.3* 32.5* 30.0* 29.5* 28.4*  MCV 96.5 97.6 98.7 98.3 97.3  PLT 338 360 326 346 952   Basic Metabolic Panel: Recent Labs  Lab 06/22/18 0520 06/23/18 0501 06/24/18 0436 06/25/18 0315 06/25/18 1429 06/26/18 0422 06/26/18 0828 06/27/18 1001 06/28/18 0447  NA 136 136 133* 135 134*  --  133* 135 135  K 3.3* 3.9 3.7 4.2 4.4  --  4.5 4.2 4.8  CL 92*  93* 95* 99 102  --  103 105 106  CO2 35* 34* 32 27 24  --  22 23 22   GLUCOSE 164* 182* 193* 69* 176*  --  173* 185* 182*  BUN 10 16 25* 28* 30*  --  30* 31* 34*  CREATININE 0.62 0.56* 0.57* 0.50* 0.50*  --  0.56* 0.55* 0.55*  CALCIUM 8.6* 8.5* 8.6* 8.7* 8.3*  --  8.7* 8.8* 8.9  MG 1.6* 2.0 1.6* 2.2  --  2.0  --   --  2.2  PHOS 2.2* 2.5 2.8 3.1  --   --   --   --  4.5   GFR: Estimated Creatinine Clearance: 44.2 mL/min (A) (by C-G formula based on SCr of 0.55 mg/dL (L)). Liver Function Tests: Recent Labs  Lab 06/22/18 0520 06/23/18 0501 06/24/18 0436 06/25/18 1429 06/28/18 0447  AST 32 29 46* 44* 58*  ALT 20 21 31  42 58*  ALKPHOS 57 56 54 63 78  BILITOT 0.8 0.5 0.6 0.1* 0.2*  PROT 6.1* 5.8* 5.8* 6.5 6.2*  ALBUMIN 3.0* 2.7* 2.9* 3.0* 3.0*   No results for input(s): LIPASE, AMYLASE in the last 168 hours. No results for input(s): AMMONIA in the last 168 hours. Coagulation Profile: No results for input(s): INR, PROTIME in the last 168 hours. Cardiac Enzymes: No results for input(s): CKTOTAL, CKMB, CKMBINDEX, TROPONINI in the last 168 hours. BNP (last 3 results) No results for input(s): PROBNP in the last 8760 hours. HbA1C: No results for input(s): HGBA1C in the last 72 hours. CBG: Recent Labs  Lab 06/27/18 2147 06/28/18 0617 06/28/18 0755 06/28/18 1127 06/28/18 1407  GLUCAP 190* 166* 116* 199* 180*   Lipid Profile: Recent Labs    06/28/18 0447  TRIG 57   Thyroid Function Tests: No results for input(s): TSH, T4TOTAL, FREET4, T3FREE, THYROIDAB in the last 72 hours. Anemia Panel: No results for input(s): VITAMINB12, FOLATE, FERRITIN, TIBC, IRON, RETICCTPCT in the last 72 hours. Sepsis Labs: No results for input(s): PROCALCITON, LATICACIDVEN in the last 168 hours.  Recent Results (from the past 240 hour(s))  Culture, blood (routine x 2)     Status: None   Collection Time: 06/19/18  7:54 PM  Result Value Ref Range Status   Specimen Description BLOOD LEFT  ANTECUBITAL  Final   Special Requests   Final    BOTTLES DRAWN AEROBIC ONLY Blood Culture adequate volume Performed at [facility] [address], [place], [place] [address]    Culture   Final    NO GROWTH 5 DAYS Performed at [facility], [place] [address], [place], [place] [address]    Report Status 06/24/2018 FINAL  Final  Culture, blood (routine x 2)  Status: None   Collection Time: 06/19/18  7:54 PM  Result Value Ref Range Status   Specimen Description   Final    BLOOD LEFT ARM Performed at [facility] [address] [place], [place] [address]    Special Requests   Final    BOTTLES DRAWN AEROBIC ONLY Blood Culture adequate volume Performed at [facility] [address] [place], [place] [address]    Culture   Final    NO GROWTH 5 DAYS Performed at [facility], [place] [address], [place], [place] [address]    Report Status 06/24/2018 FINAL  Final  Expectorated sputum assessment w rflx to resp cult     Status: None   Collection Time: 06/22/18  4:50 PM  Result Value Ref Range Status   Specimen Description SPU EXPECTORATED  Final   Special Requests NONE  Final   Sputum evaluation   Final    Sputum specimen not acceptable for testing.  Please recollect.   RESULTS CALLED TO A. PEREZ,RN [id number] @ [facility] Performed at [facility], [place] [address] [place], [place] [address]    Report Status 06/22/2018 FINAL  Final         Radiology Studies: Dg Abd 2 Views  Result Date: 06/27/2018 CLINICAL DATA:  Small bowel obstruction. EXAM: ABDOMEN - 2 VIEW COMPARISON:  June 25, 2018 FINDINGS: Small pleural effusions. Lung bases otherwise unchanged. No free air, portal venous gas or pneumatosis. Continued small bowel dilatation. There is some air in the colon. No other change. IMPRESSION: Continued small bowel obstruction. There is some air in the colon suggesting the obstruction is  either early or partial. Electronically Signed   By: Dorise Bullion III M.D   On: 06/27/2018 14:32        Scheduled Meds: . budesonide (PULMICORT) nebulizer solution  0.5 mg Nebulization BID  . docusate sodium  100 mg Oral BID  . enoxaparin (LOVENOX) injection  30 mg Subcutaneous Q24H  . feeding supplement  1 Container Oral TID BM  . folic acid  1 mg Oral Daily  . hydrocortisone-pramoxine   Rectal QID  . insulin aspart  0-9 Units Subcutaneous Q8H  . ipratropium-albuterol  3 mL Nebulization QID  . mouth rinse  15 mL Mouth Rinse BID  . metoprolol succinate  50 mg Oral Daily  . multivitamin with minerals  1 tablet Oral Daily  . thiamine  100 mg Oral Daily   Continuous Infusions: . methocarbamol (ROBAXIN) IV 500 mg (06/16/18 2304)  . TPN ADULT (ION) 65 mL/hr at 06/27/18 1757  . TPN ADULT (ION)       LOS: 14 days    Time spent: 35 minutes.     Elmarie Shiley, MD [facility]  If 7PM-7AM, please contact night-coverage [email or web address] Password [email or web address] 06/28/2018, 2:15 PM

## 2018-06-28 NOTE — Progress Notes (Signed)
Patient ID: [name], male   DOB: [birth year], 68 y.o.   MRN: [id number]    10 Days Post-Op  Subjective: CC: Incarcerated hernia Reports he is doing better this AM. No abdominal pain, nausea or emesis. Continues to pass flatus. Several BM overnight. Less watery now and more formed. Only eating a very small amount of solid food. Had 2 sugar cookies yesterday and a few sips of boost. Mobilizing more and using IS. His girlfriend, Charlynn Grimes is at bedside.   Objective: Vital signs in last 24 hours: Temp:  [97.7 F (36.5 C)-98.3 F (36.8 C)] 98.3 F (36.8 C) (01/20 0553) Pulse Rate:  [72-96] 86 (01/20 0929) Resp:  [17-20] 17 (01/20 0704) BP: (112-159)/(79-91) 149/82 (01/20 0929) SpO2:  [94 %-98 %] 96 % (01/20 0704) Last BM Date: 06/28/18  Intake/Output from previous day: 01/19 0701 - 01/20 0700 In: 2046.2 [P.O.:780; I.V.:1266.2] Out: 1625 [Urine:1625] Intake/Output this shift: Total I/O In: 120 [P.O.:120] Out: 0   PE: Gen: Awake, alert, pleasant. Frail appearing.  Heart: RRR, no obvious murmurs Lungs: CTA b/l. Normal effort. Now off O2.  Abd: Soft, distension improved and minimal. Non-tender on palpation. +BS GU: Incision c/d/i. Mild edema. Right inguinal hernia reducible.   Lab Results:  Recent Labs    06/27/18 1001 06/28/18 0447  WBC 11.3* 8.0  HGB 9.5* 9.2*  HCT 29.5* 28.4*  PLT 346 363   BMET Recent Labs    06/27/18 1001 06/28/18 0447  NA 135 135  K 4.2 4.8  CL 105 106  CO2 23 22  GLUCOSE 185* 182*  BUN 31* 34*  CREATININE 0.55* 0.55*  CALCIUM 8.8* 8.9   PT/INR No results for input(s): LABPROT, INR in the last 72 hours. CMP     Component Value Date/Time   NA 135 06/28/2018 0447   NA 137 05/11/2017 1437   NA 133 (L) 10/06/2016 1220   K 4.8 06/28/2018 0447   K 4.7 10/06/2016 1220   CL 106 06/28/2018 0447   CO2 22 06/28/2018 0447   CO2 26 10/06/2016 1220   GLUCOSE 182 (H) 06/28/2018 0447   GLUCOSE 99 10/06/2016 1220   BUN 34 (H) 06/28/2018 0447     BUN 8 05/11/2017 1437   BUN 12.0 10/06/2016 1220   CREATININE 0.55 (L) 06/28/2018 0447   CREATININE 0.8 10/06/2016 1220   CALCIUM 8.9 06/28/2018 0447   CALCIUM 10.1 10/06/2016 1220   PROT 6.2 (L) 06/28/2018 0447   PROT 7.3 11/20/2014 1352   ALBUMIN 3.0 (L) 06/28/2018 0447   ALBUMIN 3.6 11/20/2014 1352   AST 58 (H) 06/28/2018 0447   AST 39 (H) 11/20/2014 1352   ALT 58 (H) 06/28/2018 0447   ALT 20 11/20/2014 1352   ALKPHOS 78 06/28/2018 0447   ALKPHOS 131 11/20/2014 1352   BILITOT 0.2 (L) 06/28/2018 0447   BILITOT 0.29 11/20/2014 1352   GFRNONAA >60 06/28/2018 0447   GFRAA >60 06/28/2018 0447   Lipase     Component Value Date/Time   LIPASE 53 (H) 06/13/2018 1924       Studies/Results: Dg Abd 2 Views  Result Date: 06/27/2018 CLINICAL DATA:  Small bowel obstruction. EXAM: ABDOMEN - 2 VIEW COMPARISON:  June 25, 2018 FINDINGS: Small pleural effusions. Lung bases otherwise unchanged. No free air, portal venous gas or pneumatosis. Continued small bowel dilatation. There is some air in the colon. No other change. IMPRESSION: Continued small bowel obstruction. There is some air in the colon suggesting the obstruction is either early or partial.  Electronically Signed   By: Dorise Bullion III M.D   On: 06/27/2018 14:32    Anti-infectives: Anti-infectives (From admission, onward)   Start     Dose/Rate Route Frequency Ordered Stop   06/22/18 1300  cefTRIAXone (ROCEPHIN) 1 g in sodium chloride 0.9 % 100 mL IVPB     1 g 200 mL/hr over 30 Minutes Intravenous Every 24 hours 06/22/18 1202 06/26/18 1334   06/22/18 1300  azithromycin (ZITHROMAX) 500 mg in sodium chloride 0.9 % 250 mL IVPB     500 mg 250 mL/hr over 60 Minutes Intravenous Every 24 hours 06/22/18 1202 06/26/18 1513   06/18/18 0600  ceFAZolin (ANCEF) IVPB 2g/100 mL premix  Status:  Discontinued     2 g 200 mL/hr over 30 Minutes Intravenous On call to O.R. 06/18/18 4270 06/18/18 1211     . methocarbamol (ROBAXIN) IV  500 mg (06/16/18 2304)  . TPN ADULT (ION) 65 mL/hr at 06/27/18 1757     Assessment/Plan Hx Non-small cell lung CA Tobacco abuse- up until admission.  COPD with acute respiratory illness - post-op - followed by Pulm  -Off o2 Acute metabolic encephalopathy/postop delirium -fall/ongoing confusion 11/12. Neg Ct head -IV Lasix 1/10&1/11 -Medicine following Hyponatremia- resolved Hypokalemia/hypomagnesmia - resolved Leukocytosis - resolved  History of alcohol abuse Hypertension PAD/critical left limb ischemia- non healing leftleft first metatarsal head   -Vascular has seen the patient SevereMalnutrition (BMI 16.33)   - prealbuminimproved from 11.3 -> 22.1 today  POD 10, open recurrent left inguinal hernia repair with mesh, Dr. Harlow Asa, 06/18/18 -Admitted 1/5 for incarcerated recurrent left inguinal hernia with small bowel obstructionw transition LLQ -Ng tube removed on 1/11 -CTA 1/13 with persistent high grade SBO.Thought to berelated to edema in the incarcerated knuckle of bowel he had - wasseen intraoperatively and appeared clearly viable.  -Plain film 1/15:Ongoing severe abdominal SB distension consistent with SBO  - Started on TPN -Plan film 1/17 - Continued small bowel dilatation. Clinically improving  -Diet advanced   - Plain film 1/19 with sbo pattern with some air in colon  - 1/20 - No abdominal pain, N/V; passing flatus and having BM. Tolerating small amount of solids but not eating much.   FEN: Soft (calorie count); TPN from pharmacy/nurition ID: Preop Ancef; Azithromycin & Rocephin (COPD)1/14 - 1/18 DVT: Lovenox Follow-up with Dr.Gerkin   LOS: 14 days   Plan: Clinically does not appear to be fully obstructed given he is without N/V, tolerating Po's and having BM's. He is only eating a small amount of solids. Will continue TPN and order a calorie count and re-consulted nutrition. Continue to mobilize and use IS. PT ordered (recommended 3in1 walker on  1/17).  Jillyn Ledger , [facility] 06/28/2018, 11:08 AM Pager: [phone]

## 2018-06-29 ENCOUNTER — Inpatient Hospital Stay (HOSPITAL_COMMUNITY): Payer: Medicare Other

## 2018-06-29 ENCOUNTER — Encounter (HOSPITAL_COMMUNITY): Payer: Self-pay | Admitting: Radiology

## 2018-06-29 LAB — GLUCOSE, CAPILLARY
GLUCOSE-CAPILLARY: 163 mg/dL — AB (ref 70–99)
Glucose-Capillary: 163 mg/dL — ABNORMAL HIGH (ref 70–99)
Glucose-Capillary: 111 mg/dL — ABNORMAL HIGH (ref 70–99)
Glucose-Capillary: 209 mg/dL — ABNORMAL HIGH (ref 70–99)
Glucose-Capillary: 168 mg/dL — ABNORMAL HIGH (ref 70–99)
Glucose-Capillary: 207 mg/dL — ABNORMAL HIGH (ref 70–99)

## 2018-06-29 LAB — BASIC METABOLIC PANEL
ANION GAP: 8 (ref 5–15)
BUN: 38 mg/dL — ABNORMAL HIGH (ref 8–23)
CO2: 20 mmol/L — ABNORMAL LOW (ref 22–32)
Calcium: 9.1 mg/dL (ref 8.9–10.3)
Chloride: 104 mmol/L (ref 98–111)
Creatinine, Ser: 0.6 mg/dL — ABNORMAL LOW (ref 0.61–1.24)
GFR calc Af Amer: >60 mL/min (ref >60)
Glucose, Bld: 203 mg/dL — ABNORMAL HIGH (ref 70–99)
Potassium: 5 mmol/L (ref 3.5–5.1)
Sodium: 132 mmol/L — ABNORMAL LOW (ref 135–145)

## 2018-06-29 LAB — PHOSPHORUS: Phosphorus: 4.2 mg/dL (ref 2.5–4.6)

## 2018-06-29 LAB — MAGNESIUM: Magnesium: 2.2 mg/dL (ref 1.7–2.4)

## 2018-06-29 MED ORDER — FAMOTIDINE 20 MG PO TABS
20.0000 mg | ORAL_TABLET | Freq: Two times a day (BID) | ORAL | Status: DC
  Administered 2018-06-29 (×2): 20 mg via ORAL
  Filled 2018-06-29 (×2): qty 1

## 2018-06-29 MED ORDER — TRAVASOL 10 % IV SOLN
INTRAVENOUS | Status: AC
  Administered 2018-06-29: 18:00:00 via INTRAVENOUS
  Filled 2018-06-29: qty 842.4

## 2018-06-29 MED ORDER — ALUM & MAG HYDROXIDE-SIMETH 200-200-20 MG/5ML PO SUSP
30.0000 mL | Freq: Four times a day (QID) | ORAL | Status: DC | PRN
  Administered 2018-06-29: 30 mL via ORAL
  Filled 2018-06-29: qty 30

## 2018-06-29 MED ORDER — TRAVASOL 10 % IV SOLN
INTRAVENOUS | Status: DC
  Filled 2018-06-29: qty 842.4

## 2018-06-29 MED ORDER — IOHEXOL 300 MG/ML  SOLN: 15.0000 mL | Freq: Once | INTRAMUSCULAR | Status: DC | PRN

## 2018-06-29 MED ORDER — SODIUM CHLORIDE 0.9 % IV SOLN
INTRAVENOUS | Status: DC
  Administered 2018-06-29 – 2018-07-05 (×5): via INTRAVENOUS

## 2018-06-29 MED ORDER — SIMETHICONE 80 MG PO CHEW: 80.0000 mg | CHEWABLE_TABLET | Freq: Four times a day (QID) | ORAL | Status: DC | PRN

## 2018-06-29 NOTE — Care Management (Signed)
[facility] rep alerted of orders for 3in1 and RW. Marney Doctor RN,BSN [phone]

## 2018-06-29 NOTE — Progress Notes (Signed)
Patient in pain. Holding flutter at this time.

## 2018-06-29 NOTE — Progress Notes (Signed)
Calorie Count Note  48 hour calorie count ordered.  Diet: Soft Diet Supplements: Boost Breeze TID  DAY 1 RESULTS Pt had 50% of his 2% milk (60 kcal and 4 grams protein) and once Boost Breeze  (250 kcal and 9 grams protein). **Note- meal completion charted as 100% on 1/20 at 10 am, was a guest tray that his wife consumed.   Pt reports his intake continues to decline due to feelings of early satiety. Pt lifted his gown to show his abdomen which looks to be distended. Pt having regular BMs. He is to go for a scan today.   Will add carnation instant breakfast to meal trays, as this is the pt's favorite supplement.   Total intake: 310 kcal (21% of minimum estimated needs)  13 protein (17% of minimum estimated needs)  Nutrition Dx: Severe Malnutrition related to chronic illness(COPD) as evidenced by energy intake < or equal to 75% for > or equal to 1 month, severe fat depletion, severe muscle depletion, percent weight loss- ongoing  Goal: Patient will meet greater than or equal to 90% of their needs- met TPN  Intervention:   1. TPN per pharmacy 2. Boost Breeze po TID, each supplement provides 250 kcal and 9 grams of protein 3. Carnation Instant Breakfast PO TID, each supplement provides 220 kcal and 13 grams of protein.    Mariana Single RD, LDN Clinical Nutrition Pager # [phone]

## 2018-06-29 NOTE — Progress Notes (Signed)
Pt vomited a large immeasurable amount of brown emesis post NGT placement 30 minutes ago. Awaiting placement verification from Xray. Hob above 30 degrees.

## 2018-06-29 NOTE — Progress Notes (Addendum)
Spoke with radiological tech to expedite recent NGT placement verification Xray readings. Next shift RN aware. Pt calm now with no further vomiting noted. Denies nausea.

## 2018-06-29 NOTE — Progress Notes (Signed)
Patient ID: [name], male   DOB: 10/25/1950, 68 y.o.   MRN: [id number] CT shows severely dilated small bowel. Will make npo and place ng tube for decompression. Continue tpn for nutrition support. Will recheck abd xrays in the am. May need exploration if this does not improve soon

## 2018-06-29 NOTE — Progress Notes (Signed)
Physical Therapy Treatment Patient Details Name: [name] MRN: [id number] DOB: 11/03/1950 Today's Date: 06/29/2018    History of Present Illness Patient has poor pulmonary reserve due to advanced COPD, continued smoker, now s/p inguinal hernia repair, had increased confusion post op, at risk of decompensation, was transferred to stepdown unit on 1/10;     PT Comments    The patient in bed on RA with O2 saturation 99%. Patient ambulated x 125 on RA with sats > 90%. Patient may benefit from a 4 wheeled RW for decreased endurance. Continue PT, monitor ambulation saturation.  Follow Up Recommendations  Home health PT     Equipment Recommendations  (rollator  may be beneficial for  endurance) continue to check saturation   Recommendations for Other Services       Precautions / Restrictions Precautions Precautions: Fall Precaution Comments: O2,monitor sats, did nott use O2    Mobility  Bed Mobility   Bed Mobility: Supine to Sit;Sit to Supine     Supine to sit: Supervision Sit to supine: Supervision   General bed mobility comments: pt able to self perform.  safety with IV line only   Transfers Overall transfer level: Needs assistance Equipment used: Rolling walker (2 wheeled) Transfers: Sit to/from Stand Sit to Stand: Supervision            Ambulation/Gait Ambulation/Gait assistance: Supervision Gait Distance (Feet): 125 Feet Assistive device: Rolling walker (2 wheeled) Gait Pattern/deviations: Step-through pattern Gait velocity: decreased    General Gait Details: patietn on Ra, sats 90%, Noted dyspnea 3/4.   Stairs             Wheelchair Mobility    Modified Rankin (Stroke Patients Only)       Balance                                            Cognition Arousal/Alertness: Awake/alert Behavior During Therapy: WFL for tasks assessed/performed;Flat affect                                   General  Comments: pleasant and conversive      Exercises      General Comments        Pertinent Vitals/Pain Pain Score: 7  Pain Location: ABD recent surgery Pain Descriptors / Indicators: Aching;Tightness Pain Intervention(s): Monitored during session;Premedicated before session    Home Living                      Prior Function            PT Goals (current goals can now be found in the care plan section) Progress towards PT goals: Progressing toward goals    Frequency    Min 3X/week      PT Plan Current plan remains appropriate    Co-evaluation              AM-PAC PT "6 Clicks" Mobility   Outcome Measure  Help needed turning from your back to your side while in a flat bed without using bedrails?: A Little Help needed moving from lying on your back to sitting on the side of a flat bed without using bedrails?: A Little Help needed moving to and from a bed to a chair (including a wheelchair)?: A Little Help needed  standing up from a chair using your arms (e.g., wheelchair or bedside chair)?: A Little Help needed to walk in hospital room?: A Little Help needed climbing 3-5 steps with a railing? : A Little 6 Click Score: 18    End of Session   Activity Tolerance: Patient tolerated treatment well Patient left: in bed;with call bell/phone within reach Nurse Communication: Mobility status PT Visit Diagnosis: Unsteadiness on feet (R26.81);History of falling (Z91.81)     Time: 5366-4403 PT Time Calculation (min) (ACUTE ONLY): 24 min  Charges:  $Gait Training: 23-37 mins                     Tresa Endo PT Acute Rehabilitation Services Pager [phone] Office [phone]    Claretha Cooper 06/29/2018, 1:11 PM

## 2018-06-29 NOTE — Progress Notes (Addendum)
Occupational Therapy Treatment Patient Details Name: Geroge Gilliam MRN: [id number] DOB: 11/19/1950 Today's Date: 06/29/2018    History of present illness Patient has poor pulmonary reserve due to advanced COPD, continued smoker, now s/p inguinal hernia repair, had increased confusion post op, at risk of decompensation, was transferred to stepdown unit on 1/10;    OT comments  Limited treatment session as transport arriving to take pt for CT. Assisted with transfer from recliner to transport chair during session. Pt completing room level mobility using RW with close minguard assist overall; completing LB ADL with setup assist prior to mobility. Unable to obtain accurate O2 reading during session however pt with no SOB noted during or after mobility completion. Will continue per POC at this time.    Follow Up Recommendations  SNF;Home health OT;Supervision/Assistance - 24 hour    Equipment Recommendations  None recommended by OT          Precautions / Restrictions Precautions Precautions: Fall Precaution Comments: O2,monitor sats, pt now on RA       Mobility Bed Mobility   Bed Mobility: Supine to Sit;Sit to Supine     Supine to sit: Supervision Sit to supine: Supervision   General bed mobility comments: pt OOB in recliner upon arrival  Transfers Overall transfer level: Needs assistance Equipment used: Rolling walker (2 wheeled) Transfers: Sit to/from Stand Sit to Stand: Supervision         General transfer comment: cues for safety and hand placement using walker     Balance Overall balance assessment: Needs assistance;History of Falls Sitting-balance support: No upper extremity supported;Feet supported Sitting balance-Leahy Scale: Fair     Standing balance support: Bilateral upper extremity supported Standing balance-Leahy Scale: Poor Standing balance comment: reliant on UEs                           ADL either performed or assessed with clinical  judgement   ADL Overall ADL's : Needs assistance/impaired                     Lower Body Dressing: Minimal assistance;Sit to/from stand Lower Body Dressing Details (indicate cue type and reason): pt setup to don slippers prior to mobility             Functional mobility during ADLs: Min guard;Minimal assistance;Rolling walker General ADL Comments: session limited as transport arriving to take pt for CT; assisted with transfer to wheelchair from recliner; pt on RA during session, unable to obtain accurate O2 reading with pulse ox however pt with no apparent SOB with room level mobility, noted with sats maintaining >90% on RA throughout the day today      Vision       Perception     Praxis      Cognition Arousal/Alertness: Awake/alert Behavior During Therapy: WFL for tasks assessed/performed;Flat affect                                   General Comments: pleasant and conversive, did not formally assess        Exercises     Shoulder Instructions       General Comments      Pertinent Vitals/ Pain       Pain Assessment: Faces Pain Score: 7  Faces Pain Scale: Hurts little more Pain Location: ABD recent surgery Pain Descriptors / Indicators: Aching;Tightness Pain Intervention(s):  Monitored during session;Limited activity within patient's tolerance  Home Living                                          Prior Functioning/Environment              Frequency  Min 2X/week        Progress Toward Goals  OT Goals(current goals can now be found in the care plan section)  Progress towards OT goals: Progressing toward goals  Acute Rehab OT Goals Patient Stated Goal: to go home soon OT Goal Formulation: With patient Time For Goal Achievement: 07/06/18 Potential to Achieve Goals: Good  Plan Discharge plan remains appropriate    Co-evaluation                 AM-PAC OT "6 Clicks" Daily Activity     Outcome  Measure   Help from another person eating meals?: A Little Help from another person taking care of personal grooming?: A Little Help from another person toileting, which includes using toliet, bedpan, or urinal?: A Little Help from another person bathing (including washing, rinsing, drying)?: A Little Help from another person to put on and taking off regular upper body clothing?: A Little Help from another person to put on and taking off regular lower body clothing?: A Little 6 Click Score: 18    End of Session Equipment Utilized During Treatment: Rolling walker  OT Visit Diagnosis: Unsteadiness on feet (R26.81);Other abnormalities of gait and mobility (R26.89);Repeated falls (R29.6);Muscle weakness (generalized) (M62.81)   Activity Tolerance Patient tolerated treatment well   Patient Left with family/visitor present;Other (comment)(being taken to CT via transport)   Nurse Communication Mobility status        Time: 7473-4037 OT Time Calculation (min): 11 min  Charges: OT General Charges $OT Visit: 1 Visit OT Treatments $Therapeutic Activity: 8-22 mins  Lou Cal, OT Supplemental Rehabilitation Services Pager [phone] Office [phone]    Raymondo Band 06/29/2018, 2:55 PM

## 2018-06-29 NOTE — Progress Notes (Addendum)
Patient ID: [name], male   DOB: 1950/10/18, 68 y.o.   MRN: [id number]    11 Days Post-Op  Subjective: CC: Abdominal distension Complains of abdominal distension and generalized pain worse in the lower abdomen this AM. Feels he is eating to frequently and is having difficulty tolerating this. Reports burning in his epigastrium with some burping/belching after eating. Some nausea as well. No emesis. Continues to pass flatus and BMs x 2 yesterday more solid. Using IS & fluttervalve. Not mobilizing well, PT was ordered yesterday.  Objective: Vital signs in last 24 hours: Temp:  [97.6 F (36.4 C)-98.6 F (37 C)] 97.6 F (36.4 C) (01/21 0615) Pulse Rate:  [78-95] 94 (01/21 0935) Resp:  [16-18] 18 (01/21 0935) BP: (138-157)/(79-96) 138/79 (01/21 0615) SpO2:  [94 %-97 %] 94 % (01/21 0935) Last BM Date: 06/28/18  Intake/Output from previous day: 01/20 0701 - 01/21 0700 In: 2140.5 [P.O.:600; I.V.:1540.5] Out: 1425 [Urine:1425] BM x 2 Intake/Output this shift: Total I/O In: 195 [I.V.:195] Out: -   PE: Gen: Awake, alert, pleasant. Frail appearing.  Heart: RRR, no obvious murmurs Lungs: Receiving breathing treatment. Diminished b/l. Normal effort. No wheezing, rhonchi or rales. Off O2.  Abd: Soft but distended. Generalized tenderness without rigidity or guarding. +BS GU: Incision c/d/i. Mild edema. Right inguinal hernia reducible. MSK: No edema.   Lab Results:  Recent Labs    06/27/18 1001 06/28/18 0447  WBC 11.3* 8.0  HGB 9.5* 9.2*  HCT 29.5* 28.4*  PLT 346 363   BMET Recent Labs    06/28/18 0447 06/29/18 0546  NA 135 132*  K 4.8 5.0  CL 106 104  CO2 22 20*  GLUCOSE 182* 203*  BUN 34* 38*  CREATININE 0.55* 0.60*  CALCIUM 8.9 9.1   PT/INR No results for input(s): LABPROT, INR in the last 72 hours. CMP     Component Value Date/Time   NA 132 (L) 06/29/2018 0546   NA 137 05/11/2017 1437   NA 133 (L) 10/06/2016 1220   K 5.0 06/29/2018 0546   K 4.7  10/06/2016 1220   CL 104 06/29/2018 0546   CO2 20 (L) 06/29/2018 0546   CO2 26 10/06/2016 1220   GLUCOSE 203 (H) 06/29/2018 0546   GLUCOSE 99 10/06/2016 1220   BUN 38 (H) 06/29/2018 0546   BUN 8 05/11/2017 1437   BUN 12.0 10/06/2016 1220   CREATININE 0.60 (L) 06/29/2018 0546   CREATININE 0.8 10/06/2016 1220   CALCIUM 9.1 06/29/2018 0546   CALCIUM 10.1 10/06/2016 1220   PROT 6.2 (L) 06/28/2018 0447   PROT 7.3 11/20/2014 1352   ALBUMIN 3.0 (L) 06/28/2018 0447   ALBUMIN 3.6 11/20/2014 1352   AST 58 (H) 06/28/2018 0447   AST 39 (H) 11/20/2014 1352   ALT 58 (H) 06/28/2018 0447   ALT 20 11/20/2014 1352   ALKPHOS 78 06/28/2018 0447   ALKPHOS 131 11/20/2014 1352   BILITOT 0.2 (L) 06/28/2018 0447   BILITOT 0.29 11/20/2014 1352   GFRNONAA >60 06/29/2018 0546   GFRAA >60 06/29/2018 0546   Lipase     Component Value Date/Time   LIPASE 53 (H) 06/13/2018 1924       Studies/Results: Dg Abd 2 Views  Result Date: 06/27/2018 CLINICAL DATA:  Small bowel obstruction. EXAM: ABDOMEN - 2 VIEW COMPARISON:  June 25, 2018 FINDINGS: Small pleural effusions. Lung bases otherwise unchanged. No free air, portal venous gas or pneumatosis. Continued small bowel dilatation. There is some air in the colon. No other  change. IMPRESSION: Continued small bowel obstruction. There is some air in the colon suggesting the obstruction is either early or partial. Electronically Signed   By: Dorise Bullion III M.D   On: 06/27/2018 14:32    Anti-infectives: Anti-infectives (From admission, onward)   Start     Dose/Rate Route Frequency Ordered Stop   06/22/18 1300  cefTRIAXone (ROCEPHIN) 1 g in sodium chloride 0.9 % 100 mL IVPB     1 g 200 mL/hr over 30 Minutes Intravenous Every 24 hours 06/22/18 1202 06/26/18 1334   06/22/18 1300  azithromycin (ZITHROMAX) 500 mg in sodium chloride 0.9 % 250 mL IVPB     500 mg 250 mL/hr over 60 Minutes Intravenous Every 24 hours 06/22/18 1202 06/26/18 1513   06/18/18 0600   ceFAZolin (ANCEF) IVPB 2g/100 mL premix  Status:  Discontinued     2 g 200 mL/hr over 30 Minutes Intravenous On call to O.R. 06/18/18 0086 06/18/18 1211     . methocarbamol (ROBAXIN) IV 500 mg (06/16/18 2304)  . TPN ADULT (ION) 65 mL/hr at 06/29/18 0900      Assessment/Plan Hx Non-small cell lung CA Tobacco abuse- up until admission.  COPD with acute respiratory illness - post-op- followed by Pulm  -Off o2 Acute metabolic encephalopathy/postop delirium -fall/ongoing confusion 11/12. Neg Ct head -IV Lasix 1/10&1/11 -Medicine following Hyponatremia- resolved Hypokalemia/hypomagnesmia- resolved Leukocytosis- resolved History of alcohol abuse Hypertension PAD/critical left limb ischemia- non healing leftleft first metatarsal head -Vascular has seen the patient SevereMalnutrition (BMI 16.33)   - prealbuminimproved from 11.3 -> 22.1  Internal hemorrhoids   -Seen by Dr. Thana Farr in April 2019, s/p rubber band ligation   -Denies BRB per rectume  -Analapram prn -Outpt follow up  POD11, open recurrent left inguinal hernia repair with mesh, Dr. Harlow Asa, 06/18/18    -Admitted 1/5 for incarcerated recurrent left inguinal hernia with small bowel obstructionw transition LLQ   -Ng tube removed on 1/11   -CTA 1/13 with persistent high grade SBO.Thought to berelated to edema in the incarcerated knuckle of bowel he had - wasseen intraoperatively and appeared clearly viable.    -Started on TPN 1/14   -Plain film 1/15:Ongoing severe abdominal SB distension consistent with SBO   -Plan film 1/17 - Continued small bowel dilatation. Clinically improving   -Diet advanced to soft on 1/18  - Plain film 1/19 with sbo pattern with some air in colon  - 1/20 -Improving clinically. Calorie count ordered  - 1/21 - Pt with abdominal pain, distension and nausea after increased frequency of diet. Not mobilizing well.    -Obtain CT abd & pel w/ oral contrast   -Simethicone and famotidine  added   -PT recommended 3in1 walker on 1/17. Ordered   -Mobilize (PT), IS    PYP:PJKD (calorie count); TPN from pharmacy/nurition ID: Preop Ancef; Azithromycin & Rocephin (COPD)1/14 - 1/18 DVT: Lovenox Follow-up with Dr.Gerkin   Plan: Clinically patient does not appear to be fully obstructed given he is without emesis, and is continuing to pass bowel movements.  However has increased abdominal distention, pain, nausea, and indigestion this a.m. since increasing diet yesterday. Abdominal exam without any peritoneal signs. Will add simethicone and famotidine.  Will obtain CT abd/pel w/ oral contrast.  Will hold off on weaning TPN given patients level of malnourishement.  Will continue calorie count.  Continue to mobilize (PT to see) and use IS/fluttervalve.    LOS: 15 days    Jillyn Ledger , [facility] Surgery 06/29/2018, 9:50  AM Pager: [phone]

## 2018-06-29 NOTE — Progress Notes (Signed)
PHARMACY - ADULT TOTAL PARENTERAL NUTRITION CONSULT NOTE   Pharmacy Consult for TPN Indication: NPO for incarcerated bowel; too malnourished for surgery  Patient Measurements: Height: 5\' 2"  (157.5 cm) Weight: 75 lb 14.4 oz (34.4 kg) IBW/kg (Calculated) : 54.6 TPN AdjBW (KG): 40.5 Body mass index is 13.88 kg/m. 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 Presents this admission with SBO due to hernia, now s/p open inguinal hernia repair but CT still shows persistent SBO separate from hernia repair site - likely d/t edema/congestion from prior incarceration.  Central access:  PICC placed 1/14 TPN start date: 1/14   Significant events:  1/15: made NPO, chg MV, lytes to IV or in TPN 1/17: slight return of bowel function, advancing to clears 1/18 2 Boost/Resource Breeze supplements taken yesterday 1/19 100% 2 meals , 75 % 1 meal and 0% 2 meals recorded. 2 boost/resource breeze supplements charted, KUB ordered by CCS 1/20: multiple BMs reported, still with poor PO intake but some improvement, GF reports poor intake at baseline 1/21: PO intake increasing but still minimal and not meeting requirements- patient having a great deal of difficulty tolerating diet  Insulin Requirements: 6 units SSI yesterday  Current Nutrition: TPN, soft diet  IVF: none  Today, 06/29/2018:  Glucose (CBG goal 100-150) - range 111-203 sensitive scale SSI (note low TBW). Solumedrol x1 on 1/14  Electrolytes - Na 132, K approaching upper end of normal range  Renal - SCr stable WNL; BUN had decreased to WNL, now rising again  LFTs -  AST 58 1/20  TGs - WNL 1/20  Prealbumin - WNL 1/20  NUTRITIONAL GOALS                                                                                              RD recs: (1/13) Kcal:  1450-1650 kcal Protein:  70-85 grams Fluid:  >/= 1.4 L/day  PLAN  At 1800 today:  Discussed with CCS who will continue TPN for now due to level of malnourishment though diet increasing (poor tolerance) and having BMs  Continue TPN at 65 ml/hr; maxing out protein to allow for reduced dextrose  Adjusted TPN provides 84 g of protein and 1464 kcals still meeting 100% of patient needs  Continue PO MVI  Electrolytes in TPN: furthrer increased Na to 100 meq/L, will further decrease K to 30 meq/L,continue with decreased phos at 10 mmol/L, Mg, others standard concentration ; Cl:Ac change to 1:1  Sensitive SSI, q8h CBGs  Recheck BMET, Mg, and phos tomorrow   TPN lab panels on Mondays & Thursdays  F/u ability to wean TPN and advance of diet  Ulice Dash, PharmD Clinical Pharmacist Pager # [phone]  06/29/2018 10:16 AM

## 2018-06-29 NOTE — Progress Notes (Signed)
PROGRESS NOTE    [name]  PJA:[id number] DOB: 04/17/1951 DOA: 06/13/2018 PCP: [name], MD    Brief Narrative: 68 year old with past medical history significant for COPD not on home oxygen, peripheral vascular disease a status post DES in 03/2018, history of non-small cell lung cancer status post radiation who presents with nausea vomiting found to have SBO from inguinal hernia. Patient was cleared by cardiology for surgery.  Patient underwent left inguinal hernia repair with mesh on June 18, 2018 Dr. Harlow Asa. Patient has develops post operative SBO. He also develops Post Operative Hypoxemia and respiratory failure which has improved.   He was started on Diet but 1-21 he develops worsening abdominal distension and plann is to reat CT abdomen.    Assessment & Plan:   Principal Problem:   Incarcerated left inguinal hernia s/p repair 06/18/2018 Active Problems:   Smoker   COPD (chronic obstructive pulmonary disease) (HCC)   Essential hypertension   Protein-calorie malnutrition, severe (HCC)   Severe claudication (HCC)   PAD (peripheral artery disease) (HCC)   Acute hypoxemic respiratory failure (HCC)   Acute metabolic encephalopathy   SBO (small bowel obstruction) (HCC)   Inguinal hernia of left side with obstruction and without gangrene   1-SBO 2/2 left inguinal hernia --On general surgery service, Plavix held for 5 days, he is brought to the OR  On 1/10 and underwent OPEN RECURRENT LEFT INGUINAL HERNIA REPAIR WITH MESH (Left), INSERTION OF MESH (Left) -general surgery oked to Resume plavix  -he is started on TPN on 1/14 due to persistent ileus, hold all oral meds --plan per general surgery Per CT angios on January 13 there is persistent high-grade small bowel obstruction KUB 1-15 persistent dilation SBO.  KUB stable, gas colon, partial obstruction. He is improving clinically. He had multiple BM>  Worsening distension today.  Surgery order CT abdomen.    Postop  hypoxia  -Patient has poor pulmonary reserve due to advanced COPD, continued smoker,  status post abdomen surgery, with increased confusion, at risk of decompensation, he is transferred to stepdown unit on 1/10 -stat chest x-ray obtained on 1/10 showed "Small new bilateral pleural effusions with probable atelectasis at the left base" he received lasix 20mg  iv x1 on 1/10 and 20mg  iv x1 on 1/11 with good diruesis, he received another dose of lasix 40mg  on 1/14. Will likely need lasix prn while on TPN. -critical care consulted, input appreciated --persistent hypoxia, girlfriends reports this is new,  CTA chest no PE, does show mucus plugging and infiltrates, he start to have diffuse wheezing on 1/14, will treat as COPD exacerbation, start steroids, increase nebs, chest PT, start rocephin/zithro , plan for 5 days abx treatment. Discussed with CCM , steroid stop.  Received IV lasix 1-19. Stable.   Acute metabolic encephalopathy/delirium on 1/10: Likely multifactorial , alcohol withdrawal? Hyponatremia, surgery with anesthesia, inhospital delirium -per family patient has h/o confusion in the past during hospitalization -ct head no acute findings, lactic acid wnl, blood culture no growth - avoid dilaudid ,ativan , d/c tele -low dose zyprexa started on 1/12, girlfriend states it made him more agitated.  Avoid Zyprexa -Alert, conversant.   H/o internal hemorrhoids -Seen by Dr. Thana Farr in April 2019, s/p rubber band ligation  -Blood per rectum this morning, denies abdominal pain -Defer to general surgery  Hyponatremia: Continue with IV fluids.   Hypokalemia:   resolved/.   Hypomagnesemia:mg normal.   COPD/heavy smoker -Not on home O2 -Likely will benefit O2 at discharge ( per RN  prior to surgery patient o2 dropped to the 70's on room air  while Ambulating with one person  Assist to the bathroom) -need outpatient follow up for COPD  Left upper lobe lung cancer  Lung nodules x2 left upper  lobe and 1 left lower lobe s/p XRT -Case discussed with radiation oncology Dr. Tammi Klippel who stated patient has stage Ia status post curative intent treatment XRT finished in July 2019 -Per Dr. Tammi Klippel patient missed his restaging scan, Dr. Tammi Klippel recommended CT chest with IV contrast prior to discharge which is done on 1/13 -pa and follow with him every 47-month.   PAD s/p stent -PAD s/pSuccessful self expanding Eluvia 7.0 X 60 mm stent placement Rt external iliac arteryon 03/30/2018 by cardiology Dr Virgina Jock,  -Has an appointment with vascular surgery Dr Donnetta Hutching on 2/11 -Girl friend reports patient has an appointment with Dr Donnetta Hutching on 1/6 however patient was admitted to the hospital Girl friend reports patient has increase pain on left leg while walking, she request vascular being consulted while patient is in the hospital, will contact vascular surgery. -vascular surgery Dr Scot Dock input appreciated, patient underwent CT angiogram, per Dr Scot Dock think patient likely will need bypass surgery, but do not think he could tolerate major vascular surgery given his respiratory status, severely debilitated state, and severe protein calorie malnutrition., recommend outpatient follow up with him.  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 Nutrition consulted.  on TPN/       Nutrition Problem: Severe Malnutrition Etiology: chronic illness(COPD)    Signs/Symptoms: energy intake < or equal to 75% for > or equal to 1 month, severe fat depletion, severe muscle depletion, percent weight loss    Interventions: TPN  Estimated body mass index is 13.88 kg/m as calculated from the following:   Height  as of this encounter: 5\' 2"  (1.575 m).   Weight as of this encounter: 34.4 kg.   DVT prophylaxis: Lovenox Code Status: Full code Family Communication; care discussed with patient Disposition Plan: Patient with persistent anemia, requiring IV TPN.  Consultants:   General surgery as primary  hospitalist as consultant  Cardiology as consultant  Pulmonary/critical care   Procedures:  Ng placement and removal  on 1/10 s/p OPEN RECURRENT LEFT INGUINAL HERNIA REPAIR WITH MESH (Left), INSERTION OF MESH (Left) TPN    Antimicrobials:   Perioperative  Rocephin/zithro from 1/14 , plan for 5 days   Subjective: He report breathing well.  Abdomen more distended, pain.   Objective: Vitals:   06/28/18 1957 06/28/18 2228 06/29/18 0615 06/29/18 0935  BP:  (!) 157/96 138/79   Pulse:  95 85 94  Resp:  16 17 18   Temp:  98.4 F (36.9 C) 97.6 F (36.4 C)   TempSrc:  Oral Oral   SpO2: 96% 94% 94% 94%  Weight:      Height:        Intake/Output Summary (Last 24 hours) at 06/29/2018 1340 Last data filed at 06/29/2018 0900 Gross per 24 hour  Intake 1961.14 ml  Output 1425 ml  Net 536.14 ml   Filed Weights   06/18/18 1437 06/22/18 0845 06/25/18 1418  Weight: 40.5 kg 38 kg 34.4 kg    Examination:  General exam: NAD Respiratory  system: CTA Cardiovascular system: S 1, S 2 RRR Gastrointestinal system: Abdomen distended, tender,  Incision inguinal area healing Central nervous system; Alert, answer questions.  Extremities: symmetric power.  Skin: no rashes.     Data Reviewed: I have personally reviewed following labs and imaging studies  CBC: Recent Labs  Lab 06/23/18 0501 06/25/18 0315 06/26/18 0828 06/27/18 1001 06/28/18 0447  WBC 9.1 9.3 8.9 11.3* 8.0  NEUTROABS 8.1*  --   --   --  6.5  HGB 10.0* 10.5* 9.7* 9.5* 9.2*  HCT 30.3* 32.5* 30.0* 29.5* 28.4*  MCV 96.5 97.6 98.7 98.3 97.3  PLT 338 360 326 346 093   Basic Metabolic Panel: Recent Labs  Lab  06/23/18 0501 06/24/18 0436 06/25/18 0315 06/25/18 1429 06/26/18 0422 06/26/18 0828 06/27/18 1001 06/28/18 0447 06/29/18 0546  NA 136 133* 135 134*  --  133* 135 135 132*  K 3.9 3.7 4.2 4.4  --  4.5 4.2 4.8 5.0  CL 93* 95* 99 102  --  103 105 106 104  CO2 34* 32 27 24  --  22 23 22  20*  GLUCOSE 182* 193* 69* 176*  --  173* 185* 182* 203*  BUN 16 25* 28* 30*  --  30* 31* 34* 38*  CREATININE 0.56* 0.57* 0.50* 0.50*  --  0.56* 0.55* 0.55* 0.60*  CALCIUM 8.5* 8.6* 8.7* 8.3*  --  8.7* 8.8* 8.9 9.1  MG 2.0 1.6* 2.2  --  2.0  --   --  2.2 2.2  PHOS 2.5 2.8 3.1  --   --   --   --  4.5 4.2   GFR: Estimated Creatinine Clearance: 44.2 mL/min (A) (by C-G formula based on SCr of 0.6 mg/dL (L)). Liver Function Tests: Recent Labs  Lab 06/23/18 0501 06/24/18 0436 06/25/18 1429 06/28/18 0447  AST 29 46* 44* 58*  ALT 21 31 42 58*  ALKPHOS 56 54 63 78  BILITOT 0.5 0.6 0.1* 0.2*  PROT 5.8* 5.8* 6.5 6.2*  ALBUMIN 2.7* 2.9* 3.0* 3.0*   No results for input(s): LIPASE, AMYLASE in the last 168 hours. No results for input(s): AMMONIA in the last 168 hours. Coagulation Profile: No results for input(s): INR, PROTIME in the last 168 hours. Cardiac Enzymes: No results for input(s): CKTOTAL, CKMB, CKMBINDEX, TROPONINI in the last 168 hours. BNP (last 3 results) No results for input(s): PROBNP in the last 8760 hours. HbA1C: No results for input(s): HGBA1C in the last 72 hours. CBG: Recent Labs  Lab 06/28/18 1407 06/28/18 2231 06/29/18 0620 06/29/18 0737 06/29/18 1217  GLUCAP 180* 200* 163* 111* 209*   Lipid Profile: Recent Labs    06/28/18 0447  TRIG 57   Thyroid Function Tests: No results for input(s): TSH, T4TOTAL, FREET4, T3FREE, THYROIDAB in the last 72 hours. Anemia Panel: No results for input(s): VITAMINB12, FOLATE, FERRITIN, TIBC, IRON, RETICCTPCT in the last 72 hours. Sepsis Labs: No results for input(s): PROCALCITON, LATICACIDVEN in the last 168 hours.  Recent Results  (from the past 240 hour(s))  Culture, blood (routine x 2)     Status: None   Collection Time: 06/19/18  7:54 PM  Result Value Ref Range Status   Specimen Description BLOOD LEFT ANTECUBITAL  Final   Special Requests   Final    BOTTLES DRAWN AEROBIC ONLY Blood Culture adequate volume Performed at [facility] [address] [place], [place] [address]    Culture   Final    NO GROWTH 5 DAYS  Performed at [facility], [place] [address] [place], [place] [address]    Report Status 06/24/2018 FINAL  Final  Culture, blood (routine x 2)     Status: None   Collection Time: 06/19/18  7:54 PM  Result Value Ref Range Status   Specimen Description   Final    BLOOD LEFT ARM Performed at [facility] [address] [place], [place] [address]    Special Requests   Final    BOTTLES DRAWN AEROBIC ONLY Blood Culture adequate volume Performed at [facility] [address], [place], [place] [address]    Culture   Final    NO GROWTH 5 DAYS Performed at [facility], [place] [address] [place], [place] [address]    Report Status 06/24/2018 FINAL  Final  Expectorated sputum assessment w rflx to resp cult     Status: None   Collection Time: 06/22/18  4:50 PM  Result Value Ref Range Status   Specimen Description SPU EXPECTORATED  Final   Special Requests NONE  Final   Sputum evaluation   Final    Sputum specimen not acceptable for testing.  Please recollect.   RESULTS CALLED TO A. PEREZ,RN [id number] @ [place] Performed at [facility], [place] [address], [place], [place] [address]    Report Status 06/22/2018 FINAL  Final         Radiology Studies: No results found.      Scheduled Meds: . budesonide (PULMICORT) nebulizer solution  0.5 mg Nebulization BID  . docusate sodium  100 mg Oral BID  . enoxaparin (LOVENOX) injection  30 mg Subcutaneous Q24H  . famotidine  20 mg Oral BID  . feeding  supplement  1 Container Oral TID BM  . folic acid  1 mg Oral Daily  . hydrocortisone-pramoxine   Rectal QID  . insulin aspart  0-9 Units Subcutaneous Q8H  . ipratropium-albuterol  3 mL Nebulization TID  . mouth rinse  15 mL Mouth Rinse BID  . metoprolol succinate  50 mg Oral Daily  . multivitamin with minerals  1 tablet Oral Daily  . thiamine  100 mg Oral Daily   Continuous Infusions: . methocarbamol (ROBAXIN) IV 500 mg (06/16/18 2304)  . TPN ADULT (ION) 65 mL/hr at 06/29/18 0900  . TPN ADULT (ION)       LOS: 15 days    Time spent: 35 minutes.     Elmarie Shiley, MD [facility]  If 7PM-7AM, please contact night-coverage [email or web address] Password [email or web address] 06/29/2018, 1:40 PM

## 2018-06-29 NOTE — Progress Notes (Signed)
Pt is having pain and does not want to do flutter/cpt at this time.  RN notified.

## 2018-06-30 ENCOUNTER — Encounter (HOSPITAL_COMMUNITY): Admission: EM | Disposition: A | Discharge status: Home Health | Payer: Self-pay | Source: Home / Self Care

## 2018-06-30 ENCOUNTER — Inpatient Hospital Stay (HOSPITAL_COMMUNITY): Payer: Medicare Other | Admitting: Certified Registered"

## 2018-06-30 ENCOUNTER — Inpatient Hospital Stay (HOSPITAL_COMMUNITY): Payer: Medicare Other

## 2018-06-30 ENCOUNTER — Inpatient Hospital Stay: Payer: Self-pay

## 2018-06-30 ENCOUNTER — Encounter (HOSPITAL_COMMUNITY): Payer: Self-pay | Admitting: Certified Registered"

## 2018-06-30 HISTORY — PX: LAPAROTOMY: SHX154

## 2018-06-30 LAB — BASIC METABOLIC PANEL
Anion gap: 8 (ref 5–15)
BUN: 45 mg/dL — ABNORMAL HIGH (ref 8–23)
CO2: 24 mmol/L (ref 22–32)
Calcium: 9 mg/dL (ref 8.9–10.3)
Chloride: 105 mmol/L (ref 98–111)
Creatinine, Ser: 0.6 mg/dL — ABNORMAL LOW (ref 0.61–1.24)
GFR calc non Af Amer: >60 mL/min (ref >60)
Glucose, Bld: 122 mg/dL — ABNORMAL HIGH (ref 70–99)
Potassium: 4.6 mmol/L (ref 3.5–5.1)
Sodium: 137 mmol/L (ref 135–145)

## 2018-06-30 LAB — BLOOD GAS, ARTERIAL
Acid-base deficit: 10 mmol/L — ABNORMAL HIGH (ref 0.0–2.0)
Bicarbonate: 19.4 mmol/L — ABNORMAL LOW (ref 20.0–28.0)
Drawn by: 257701
FIO2: 100
MECHVT: 430 mL
O2 Saturation: 69.9 %
PEEP: 5 cmH2O
Patient temperature: 98.6
RATE: 12 resp/min
pCO2 arterial: 69.4 mmHg (ref 32.0–48.0)
pH, Arterial: 7.075 — CL (ref 7.350–7.450)
pO2, Arterial: 55.4 mmHg — ABNORMAL LOW (ref 83.0–108.0)

## 2018-06-30 LAB — GLUCOSE, CAPILLARY
GLUCOSE-CAPILLARY: 119 mg/dL — AB (ref 70–99)
Glucose-Capillary: 104 mg/dL — ABNORMAL HIGH (ref 70–99)
Glucose-Capillary: 149 mg/dL — ABNORMAL HIGH (ref 70–99)

## 2018-06-30 LAB — TRIGLYCERIDES: Triglycerides: 68 mg/dL (ref <150)

## 2018-06-30 LAB — ABO/RH: ABO/RH(D): A NEG

## 2018-06-30 LAB — SURGICAL PCR SCREEN
MRSA, PCR: NEGATIVE
Staphylococcus aureus: NEGATIVE

## 2018-06-30 LAB — PHOSPHORUS: Phosphorus: 4.3 mg/dL (ref 2.5–4.6)

## 2018-06-30 LAB — MAGNESIUM: MAGNESIUM: 2.2 mg/dL (ref 1.7–2.4)

## 2018-06-30 SURGERY — LAPAROTOMY, EXPLORATORY
Anesthesia: General | Site: Abdomen

## 2018-06-30 MED ORDER — INSULIN ASPART 100 UNIT/ML ~~LOC~~ SOLN
0.0000 [IU] | SUBCUTANEOUS | Status: DC
  Administered 2018-06-30: 1 [IU] via SUBCUTANEOUS
  Administered 2018-07-01 (×3): 2 [IU] via SUBCUTANEOUS
  Administered 2018-07-02: 1 [IU] via SUBCUTANEOUS
  Administered 2018-07-02: 2 [IU] via SUBCUTANEOUS

## 2018-06-30 MED ORDER — LIDOCAINE 2% (20 MG/ML) 5 ML SYRINGE
INTRAMUSCULAR | Status: DC | PRN
  Administered 2018-06-30: 50 mg via INTRAVENOUS

## 2018-06-30 MED ORDER — SUCCINYLCHOLINE CHLORIDE 200 MG/10ML IV SOSY
PREFILLED_SYRINGE | INTRAVENOUS | Status: DC | PRN
  Administered 2018-06-30: 60 mg via INTRAVENOUS

## 2018-06-30 MED ORDER — LIDOCAINE 2% (20 MG/ML) 5 ML SYRINGE
INTRAMUSCULAR | Status: AC
  Filled 2018-06-30: qty 5

## 2018-06-30 MED ORDER — PROPOFOL 10 MG/ML IV BOLUS
INTRAVENOUS | Status: DC | PRN
  Administered 2018-06-30: 40 mg via INTRAVENOUS

## 2018-06-30 MED ORDER — FENTANYL CITRATE (PF) 100 MCG/2ML IJ SOLN
50.0000 ug | Freq: Once | INTRAMUSCULAR | Status: AC
  Administered 2018-06-30: 50 ug via INTRAVENOUS
  Filled 2018-06-30: qty 2

## 2018-06-30 MED ORDER — PROPOFOL 1000 MG/100ML IV EMUL
0.0000 ug/kg/min | INTRAVENOUS | Status: DC
  Administered 2018-06-30: 25 ug/kg/min via INTRAVENOUS
  Filled 2018-06-30: qty 100

## 2018-06-30 MED ORDER — FENTANYL CITRATE (PF) 100 MCG/2ML IJ SOLN
INTRAMUSCULAR | Status: DC | PRN
  Administered 2018-06-30 (×4): 50 ug via INTRAVENOUS

## 2018-06-30 MED ORDER — SODIUM CHLORIDE 3 % IN NEBU
4.0000 mL | INHALATION_SOLUTION | Freq: Two times a day (BID) | RESPIRATORY_TRACT | Status: AC
  Administered 2018-06-30 – 2018-07-02 (×4): 4 mL via RESPIRATORY_TRACT
  Filled 2018-06-30 (×8): qty 4

## 2018-06-30 MED ORDER — SODIUM CHLORIDE 0.9 % IV SOLN
INTRAVENOUS | Status: DC | PRN
  Administered 2018-06-30: 50 ug/min via INTRAVENOUS

## 2018-06-30 MED ORDER — LACTATED RINGERS IV SOLN
INTRAVENOUS | Status: DC
  Administered 2018-06-30 (×2): via INTRAVENOUS

## 2018-06-30 MED ORDER — PROPOFOL 10 MG/ML IV BOLUS
INTRAVENOUS | Status: AC
  Filled 2018-06-30: qty 20

## 2018-06-30 MED ORDER — ROCURONIUM BROMIDE 100 MG/10ML IV SOLN
INTRAVENOUS | Status: AC
  Filled 2018-06-30: qty 1

## 2018-06-30 MED ORDER — PIPERACILLIN-TAZOBACTAM 3.375 G IVPB
3.3750 g | Freq: Three times a day (TID) | INTRAVENOUS | Status: DC
  Administered 2018-06-30 – 2018-07-07 (×21): 3.375 g via INTRAVENOUS
  Filled 2018-06-30 (×21): qty 50

## 2018-06-30 MED ORDER — CHLORHEXIDINE GLUCONATE 0.12% ORAL RINSE (MEDLINE KIT)
15.0000 mL | Freq: Two times a day (BID) | OROMUCOSAL | Status: DC
  Administered 2018-06-30 – 2018-07-04 (×4): 15 mL via OROMUCOSAL

## 2018-06-30 MED ORDER — MIDAZOLAM HCL 5 MG/5ML IJ SOLN
INTRAMUSCULAR | Status: DC | PRN
  Administered 2018-06-30 (×2): 1 mg via INTRAVENOUS

## 2018-06-30 MED ORDER — ROCURONIUM BROMIDE 10 MG/ML (PF) SYRINGE
PREFILLED_SYRINGE | INTRAVENOUS | Status: DC | PRN
  Administered 2018-06-30: 40 mg via INTRAVENOUS

## 2018-06-30 MED ORDER — ALBUMIN HUMAN 5 % IV SOLN
INTRAVENOUS | Status: AC
  Filled 2018-06-30: qty 250

## 2018-06-30 MED ORDER — ORAL CARE MOUTH RINSE: 15.0000 mL | OROMUCOSAL | Status: DC

## 2018-06-30 MED ORDER — SODIUM CHLORIDE 0.9 % IV SOLN
INTRAVENOUS | Status: DC | PRN
  Administered 2018-06-30 – 2018-07-12 (×3): 500 mL via INTRAVENOUS

## 2018-06-30 MED ORDER — ALBUMIN HUMAN 5 % IV SOLN
INTRAVENOUS | Status: DC | PRN
  Administered 2018-06-30 (×2): via INTRAVENOUS

## 2018-06-30 MED ORDER — SUCCINYLCHOLINE CHLORIDE 200 MG/10ML IV SOSY
PREFILLED_SYRINGE | INTRAVENOUS | Status: AC
  Filled 2018-06-30: qty 10

## 2018-06-30 MED ORDER — CHLORHEXIDINE GLUCONATE 0.12% ORAL RINSE (MEDLINE KIT): 15.0000 mL | Freq: Two times a day (BID) | OROMUCOSAL | Status: DC

## 2018-06-30 MED ORDER — FAMOTIDINE IN NACL 20-0.9 MG/50ML-% IV SOLN: 20.0000 mg | INTRAVENOUS | Status: DC

## 2018-06-30 MED ORDER — SODIUM CHLORIDE 0.9 % IR SOLN
Status: DC | PRN
  Administered 2018-06-30: 2000 mL

## 2018-06-30 MED ORDER — ORAL CARE MOUTH RINSE
15.0000 mL | OROMUCOSAL | Status: DC
  Administered 2018-06-30 (×3): 15 mL via OROMUCOSAL

## 2018-06-30 MED ORDER — FENTANYL CITRATE (PF) 250 MCG/5ML IJ SOLN
INTRAMUSCULAR | Status: AC
  Filled 2018-06-30: qty 5

## 2018-06-30 MED ORDER — MIDAZOLAM HCL 2 MG/2ML IJ SOLN
INTRAMUSCULAR | Status: AC
  Filled 2018-06-30: qty 2

## 2018-06-30 MED ORDER — PHENYLEPHRINE 40 MCG/ML (10ML) SYRINGE FOR IV PUSH (FOR BLOOD PRESSURE SUPPORT)
PREFILLED_SYRINGE | INTRAVENOUS | Status: DC | PRN
  Administered 2018-06-30 (×4): 80 ug via INTRAVENOUS
  Administered 2018-06-30: 40 ug via INTRAVENOUS

## 2018-06-30 MED ORDER — KCL IN DEXTROSE-NACL 20-5-0.9 MEQ/L-%-% IV SOLN
INTRAVENOUS | Status: DC
  Administered 2018-06-30: 13:00:00 via INTRAVENOUS
  Filled 2018-06-30 (×2): qty 1000

## 2018-06-30 MED ORDER — ORAL CARE MOUTH RINSE
15.0000 mL | OROMUCOSAL | Status: DC
  Administered 2018-07-01 – 2018-07-05 (×43): 15 mL via OROMUCOSAL

## 2018-06-30 MED ORDER — TRAVASOL 10 % IV SOLN
INTRAVENOUS | Status: AC
  Filled 2018-06-30: qty 842.4

## 2018-06-30 MED ORDER — CHLORHEXIDINE GLUCONATE 0.12% ORAL RINSE (MEDLINE KIT)
15.0000 mL | Freq: Two times a day (BID) | OROMUCOSAL | Status: DC
  Administered 2018-06-30 – 2018-07-05 (×8): 15 mL via OROMUCOSAL

## 2018-06-30 MED ORDER — FENTANYL BOLUS VIA INFUSION
25.0000 ug | INTRAVENOUS | Status: DC | PRN
  Filled 2018-06-30: qty 25

## 2018-06-30 MED ORDER — PROPOFOL 500 MG/50ML IV EMUL
0.0000 ug/kg/min | INTRAVENOUS | Status: DC
  Administered 2018-06-30 (×2): 5 ug/kg/min via INTRAVENOUS
  Filled 2018-06-30: qty 50

## 2018-06-30 MED ORDER — FENTANYL 2500MCG IN NS 250ML (10MCG/ML) PREMIX INFUSION
25.0000 ug/h | INTRAVENOUS | Status: DC
  Administered 2018-06-30: 50 ug/h via INTRAVENOUS
  Administered 2018-07-01: 125 ug/h via INTRAVENOUS
  Filled 2018-06-30 (×2): qty 250

## 2018-06-30 MED ORDER — IPRATROPIUM-ALBUTEROL 0.5-2.5 (3) MG/3ML IN SOLN
3.0000 mL | RESPIRATORY_TRACT | Status: DC
  Administered 2018-06-30 – 2018-07-05 (×28): 3 mL via RESPIRATORY_TRACT
  Filled 2018-06-30 (×27): qty 3

## 2018-06-30 SURGICAL SUPPLY — 37 items
APPLICATOR COTTON TIP 6 STRL (MISCELLANEOUS) IMPLANT
APPLICATOR COTTON TIP 6IN STRL (MISCELLANEOUS)
BLADE EXTENDED COATED 6.5IN (ELECTRODE) IMPLANT
BLADE HEX COATED 2.75 (ELECTRODE) ×3 IMPLANT
COVER MAYO STAND STRL (DRAPES) ×3 IMPLANT
COVER WAND RF STERILE (DRAPES) IMPLANT
DRAPE LAPAROSCOPIC ABDOMINAL (DRAPES) ×3 IMPLANT
DRAPE WARM FLUID 44X44 (DRAPE) ×3 IMPLANT
DRSG OPSITE POSTOP 4X12 (GAUZE/BANDAGES/DRESSINGS) ×3 IMPLANT
ELECT REM PT RETURN 15FT ADLT (MISCELLANEOUS) ×3 IMPLANT
GAUZE SPONGE 4X4 12PLY STRL (GAUZE/BANDAGES/DRESSINGS) ×3 IMPLANT
GLOVE BIO SURGEON STRL SZ7.5 (GLOVE) ×6 IMPLANT
GLOVE BIOGEL PI IND STRL 7.0 (GLOVE) ×1 IMPLANT
GLOVE BIOGEL PI INDICATOR 7.0 (GLOVE) ×2
GOWN STRL REUS W/ TWL XL LVL3 (GOWN DISPOSABLE) ×1 IMPLANT
GOWN STRL REUS W/TWL LRG LVL3 (GOWN DISPOSABLE) ×3 IMPLANT
GOWN STRL REUS W/TWL XL LVL3 (GOWN DISPOSABLE) ×5 IMPLANT
HANDLE SUCTION POOLE (INSTRUMENTS) IMPLANT
KIT BASIN OR (CUSTOM PROCEDURE TRAY) ×3 IMPLANT
NS IRRIG 1000ML POUR BTL (IV SOLUTION) ×3 IMPLANT
PACK GENERAL/GYN (CUSTOM PROCEDURE TRAY) ×3 IMPLANT
SPONGE LAP 18X18 RF (DISPOSABLE) IMPLANT
STAPLER VISISTAT 35W (STAPLE) ×3 IMPLANT
SUCTION POOLE HANDLE (INSTRUMENTS)
SUT NOVA NAB DX-16 0-1 5-0 T12 (SUTURE) ×3 IMPLANT
SUT PDS AB 1 CTX 36 (SUTURE) IMPLANT
SUT PDS AB 1 TP1 96 (SUTURE) ×3 IMPLANT
SUT SILK 2 0 (SUTURE) ×2
SUT SILK 2 0 SH CR/8 (SUTURE) ×3 IMPLANT
SUT SILK 2-0 18XBRD TIE 12 (SUTURE) ×1 IMPLANT
SUT SILK 3 0 (SUTURE) ×2
SUT SILK 3 0 SH CR/8 (SUTURE) IMPLANT
SUT SILK 3-0 18XBRD TIE 12 (SUTURE) ×1 IMPLANT
TOWEL OR 17X26 10 PK STRL BLUE (TOWEL DISPOSABLE) ×6 IMPLANT
TRAY FOLEY MTR SLVR 16FR STAT (SET/KITS/TRAYS/PACK) ×3 IMPLANT
WATER STERILE IRR 1000ML POUR (IV SOLUTION) ×3 IMPLANT
YANKAUER SUCT BULB TIP NO VENT (SUCTIONS) ×3 IMPLANT

## 2018-06-30 NOTE — Progress Notes (Addendum)
Pt does not want to keep NG tube that was placed earlier in day during day shift, pt pulled NG tube out and stated he was going home. Pt is still being monitored and currently has tele-sitter. Doctor notified

## 2018-06-30 NOTE — Progress Notes (Signed)
Nutrition Follow-up  DOCUMENTATION CODES:   Underweight, Severe malnutrition in context of chronic illness  INTERVENTION:   -Resume TPN per Pharmacy -D/c Calorie Count -Will monitor for plan of care  NUTRITION DIAGNOSIS:   Severe Malnutrition related to chronic illness(COPD) as evidenced by energy intake < or equal to 75% for > or equal to 1 month, severe fat depletion, severe muscle depletion, percent weight loss.  Ongoing.  GOAL:   Patient will meet greater than or equal to 90% of their needs  Not meeting needs currently.  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 Presents this admission with SBO due to hernia.   Patient currently in [place] for exploration. Per surgery note, pt has continued to have abdominal distention with persistent SBO. Calorie Count discontinued as pt will be transferred to ICU stepdown following surgery and will be intubated. No PO intakes documented for 1/21.   TPN has been ordered to resume today at 1800 at 65 ml/hr (providing 1464 kcal and 84g protein).  Last weight recorded 1/17.  Medications: Lactated Ringers infusion Labs reviewed:  CBGs: 163-207  Diet Order:   Diet Order            Diet NPO time specified Except for: Ice Chips, Sips with Meds, Other (See Comments)  Diet effective now              EDUCATION NEEDS:   Education needs have been addressed  Skin:  Skin Assessment: Reviewed RN Assessment  Last BM:  1/20  Height:   Ht Readings from Last 1 Encounters:  06/18/18 5\' 2"  (1.575 m)    Weight:   Wt Readings from Last 1 Encounters:  06/25/18 34.4 kg    Ideal Body Weight:  53.6 kg  BMI:  Body mass index is 13.88 kg/m.  Estimated Nutritional Needs:   Kcal:  1500-1700  Protein:   75-90g  Fluid:  1.5L/day  [name], MS, RD, LDN [facility] Dietitian Pager: [phone] After Hours Pager: [phone]

## 2018-06-30 NOTE — Progress Notes (Signed)
Patient ID: [name], male   DOB: 1950/11/19, 67 y.o.   MRN: [id number]    12 Days Post-Op  Subjective: CC: Distension Patient more confused this morning. He pulled out his PICC line and NG tube. He doesn't remember doing this. Denies N/V. No BM overnight. Unsure of flatus. Feels distended this morning.   Objective: Vital signs in last 24 hours: Temp:  [97.9 F (36.6 C)-99 F (37.2 C)] 99 F (37.2 C) (01/22 0527) Pulse Rate:  [73-102] 102 (01/22 0527) Resp:  [16-20] 20 (01/22 0527) BP: (143-160)/(87-91) 143/88 (01/22 0600) SpO2:  [94 %-96 %] 94 % (01/22 0527) Last BM Date: 06/28/18  Intake/Output from previous day: 01/21 0701 - 01/22 0700 In: 1924.5 [P.O.:420; I.V.:1504.5] Out: 650 [Urine:650] Intake/Output this shift: No intake/output data recorded.  PE: Gen: Awake, confused. Frail appearing.  Heart: RRR Lungs: Diminished b/l, R>L. Normal effort.No wheezing, rhonchi or rales. Off O2. Abd: Soft but distended. Generalized tenderness. Hypoactive bowel sounds.  GU: Incision c/d/i. Mild edema. Right inguinal hernia reducible. MSK: No edema.   Lab Results:  Recent Labs    06/27/18 1001 06/28/18 0447  WBC 11.3* 8.0  HGB 9.5* 9.2*  HCT 29.5* 28.4*  PLT 346 363   BMET Recent Labs    06/29/18 0546 06/30/18 0434  NA 132* 137  K 5.0 4.6  CL 104 105  CO2 20* 24  GLUCOSE 203* 122*  BUN 38* 45*  CREATININE 0.60* 0.60*  CALCIUM 9.1 9.0   PT/INR No results for input(s): LABPROT, INR in the last 72 hours. CMP     Component Value Date/Time   NA 137 06/30/2018 0434   NA 137 05/11/2017 1437   NA 133 (L) 10/06/2016 1220   K 4.6 06/30/2018 0434   K 4.7 10/06/2016 1220   CL 105 06/30/2018 0434   CO2 24 06/30/2018 0434   CO2 26 10/06/2016 1220   GLUCOSE 122 (H) 06/30/2018 0434   GLUCOSE 99 10/06/2016 1220   BUN 45 (H) 06/30/2018 0434   BUN 8 05/11/2017 1437   BUN 12.0 10/06/2016 1220   CREATININE 0.60 (L) 06/30/2018 0434   CREATININE 0.8 10/06/2016 1220     CALCIUM 9.0 06/30/2018 0434   CALCIUM 10.1 10/06/2016 1220   PROT 6.2 (L) 06/28/2018 0447   PROT 7.3 11/20/2014 1352   ALBUMIN 3.0 (L) 06/28/2018 0447   ALBUMIN 3.6 11/20/2014 1352   AST 58 (H) 06/28/2018 0447   AST 39 (H) 11/20/2014 1352   ALT 58 (H) 06/28/2018 0447   ALT 20 11/20/2014 1352   ALKPHOS 78 06/28/2018 0447   ALKPHOS 131 11/20/2014 1352   BILITOT 0.2 (L) 06/28/2018 0447   BILITOT 0.29 11/20/2014 1352   GFRNONAA >60 06/30/2018 0434   GFRAA >60 06/30/2018 0434   Lipase     Component Value Date/Time   LIPASE 53 (H) 06/13/2018 1924       Studies/Results: Ct Abdomen Pelvis Wo Contrast  Result Date: 06/29/2018 CLINICAL DATA:  Abdominal distention. Status post open recurrent left inguinal hernia repair on 06/18/2018 for incarcerated small bowel and release of small bowel obstruction. EXAM: CT ABDOMEN AND PELVIS WITHOUT CONTRAST TECHNIQUE: Multidetector CT imaging of the abdomen and pelvis was performed following the standard protocol without IV contrast. COMPARISON:  06/21/2018.  06/13/2018. FINDINGS: Lower chest: Emphysema noted in the lung bases with calcified granulomata in the lower lungs bilaterally. Persistent right middle lobe collapse. Small right pleural effusion evident. There is a hiatal hernia with circumferential wall thickening noted in the  distal esophagus. Hepatobiliary: No focal abnormality in the liver on this study without intravenous contrast. Gallbladder unremarkable. No intrahepatic or extrahepatic biliary dilation. Pancreas: Sequelae of chronic pancreatitis again noted. Spleen: No splenomegaly. No focal mass lesion. Adrenals/Urinary Tract: No adrenal nodule or mass. No hydronephrosis or gross mass lesion evident in either kidney. No evidence for hydroureter. The urinary bladder appears normal for the degree of distention. Stomach/Bowel: Contrast material is visualized in the nondistended stomach. Duodenum is patulous. Small bowel loops in the abdomen or  fluid-filled markedly dilated measuring up to 5.5 cm diameter in the left lower quadrant. This is similar to mildly progressive compared to 06/21/2018. Air and stool is seen scattered along the length of the nondilated colon. No evidence for bowel wall thickening or pneumatosis. No intraperitoneal free air. Fluid is visible in the right groin hernia, but sagittal imaging suggests that this fluid is not within bowel and that bowel has not extended into the right groin hernia (see image 39/series 5). Vascular/Lymphatic: There is abdominal aortic atherosclerosis without aneurysm. There is no gastrohepatic or hepatoduodenal ligament lymphadenopathy. No intraperitoneal or retroperitoneal lymphadenopathy. No pelvic sidewall lymphadenopathy. Reproductive: Stable 1.7 cm low-density lesion along the posterior prostate gland. This appears 2 posterior to be a TURP defect. Prostate cyst or prostatic abscess could have this appearance. Other: No substantial intraperitoneal free fluid. Musculoskeletal: No worrisome lytic or sclerotic osseous abnormality. IMPRESSION: 1. Marked relatively diffuse fluid-filled dilated small bowel measuring up to 5.5 cm diameter and compatible with small bowel obstruction. This is stable to mildly progressed comparing to 06/21/2018. A discrete transition zone is not identified on today's study. There is no small bowel wall thickening or small bowel pneumatosis. No intraperitoneal free air and no substantial intraperitoneal free fluid. 2. Right groin hernia contains fluid, but this does not appear to be fluid-filled small bowel on sagittal imaging but instead suggests a small fluid collection in the hernia sac with adjacent small bowel loop bulging in the direction of the fascial defect. Given the lack of intravenous and oral contrast, slight herniation of small bowel into the hernia sac is not entirely excluded, but in either case this does not appear to be the site of obstruction. 3. Stable 1.7 cm  low-density collection posterior prostate gland comparing back to 04/08/2016. 4. Circumferential wall thickening distal esophagus with associated hiatal hernia. Esophagitis would be a consideration. 5. Chronic right middle lobe collapse with small right pleural effusion, decreased in the interval. 6. Sequelae of chronic pancreatitis. 7.  Aortic Atherosclerois (ICD10-170.0) 8.  Emphysema. (TMH96-Q22.9) Electronically Signed   By: Misty Stanley M.D.   On: 06/29/2018 15:55   Dg Abd 1 View  Result Date: 06/30/2018 CLINICAL DATA:  Small bowel obstruction EXAM: ABDOMEN - 1 VIEW COMPARISON:  Yesterday FINDINGS: The orogastric tube has been removed. Marked distension of small bowel, measuring up to 7 cm. Relative paucity of colonic gas and right colonic stool. No concerning mass effect. No gross pneumatosis. Atherosclerosis and right iliac stenting. Chronic calcified pancreatitis. Small right pleural effusion at the posterior costophrenic sulcus. IMPRESSION: 1. Continued small bowel obstruction with marked small bowel distension. 2. The enteric tube has been removed. Electronically Signed   By: Monte Fantasia M.D.   On: 06/30/2018 07:37   Dg Abd Portable 1v  Result Date: 06/29/2018 CLINICAL DATA:  NG tube placement. EXAM: PORTABLE ABDOMEN - 1 VIEW COMPARISON:  Body CT 06/29/2018 FINDINGS: Enteric catheter with side hole at the expected level of the GE junction. Diffuse gaseous distension of  the abdomen. PICC line in stable position. IMPRESSION: Enteric catheter with side hole at the expected location of the GE junction. Electronically Signed   By: Fidela Salisbury M.D.   On: 06/29/2018 19:20    Anti-infectives: Anti-infectives (From admission, onward)   Start     Dose/Rate Route Frequency Ordered Stop   06/22/18 1300  cefTRIAXone (ROCEPHIN) 1 g in sodium chloride 0.9 % 100 mL IVPB     1 g 200 mL/hr over 30 Minutes Intravenous Every 24 hours 06/22/18 1202 06/26/18 1334   06/22/18 1300  azithromycin  (ZITHROMAX) 500 mg in sodium chloride 0.9 % 250 mL IVPB     500 mg 250 mL/hr over 60 Minutes Intravenous Every 24 hours 06/22/18 1202 06/26/18 1513   06/18/18 0600  ceFAZolin (ANCEF) IVPB 2g/100 mL premix  Status:  Discontinued     2 g 200 mL/hr over 30 Minutes Intravenous On call to O.R. 06/18/18 0531 06/18/18 1211       Assessment/Plan Hx Non-small cell lung CA HX Tobacco abuse  Acute metabolic encephalopathy/postop delirium -fall/ongoing confusion 11/12. Neg Ct head -Medicine following Hyponatremia/Hypokalemia/Hypomagnesmia History of alcohol abuse Hypertension PAD/critical left limb ischemia- non healing leftleft first metatarsal head -Vascular has seen the patient SevereMalnutrition(BMI 16.33) - prealbuminimproved from 11.3 -> 22.1  Internal hemorrhoids   -Seen by Dr. Thana Farr in April 2019, s/p rubber band ligation   -Analapram prn. Outpt follow up  POD11, open recurrent left inguinal hernia repair with mesh, Dr. Harlow Asa, 06/18/18 -Admitted 1/5 for incarcerated recurrent left inguinal hernia with small bowel obstructionw transition LLQ -CTA 1/13 with persistent high grade SBO.Thought to berelated to edema in the incarcerated knuckle of bowel he had - wasseen intraoperatively and appeared clearly viable.  -Started on TPN 1/14 -Diet advanced to soft on 1/18 -1/20 -Improving clinically. Calorie count ordered -1/21 - Inc abdominal pain, distension and nausea -Ct yesterday with SBO pattern with no discrete transition zone. NG tube was placed.  -Patient confused this morning. Removed NG tube and PICC line overnight -Continued abdominal distension, w/ hypoactive BS, small bowel distension up to 7cm on film this AM. Will discuss with Dr. Marlou Starks. Possible Ex Lap given ongoing, unresolved SBO  COPD  -Acute respiratory illness post-op- followed by Pulm -CT 1/21 with right middle lobe collapse and small right pleural effusion.  -Pulm re consulted and will follow.  Appreciate this.   FEN:NPO;TPN on hold (picc line removed) ID: Preop Ancef; Azithromycin & Rocephin (COPD)1/14- 1/18 DVT: Lovenox Follow-up with Dr.Gerkin  Plan:  Unfortunately patient is with continued, unresolved small bowel obstruction. He appears more confused this morning.  He removed his NG tube and PICC line last night and does not remember this.  I will discuss with Dr. Marlou Starks for possible exploratory laparotomy.     LOS: 16 days    Jillyn Ledger , [facility] Surgery 06/30/2018, 8:07 AM Pager: [phone]

## 2018-06-30 NOTE — Anesthesia Preprocedure Evaluation (Addendum)
Anesthesia Evaluation  Patient identified by MRN, date of birth, ID band Patient awake and Patient confused    Reviewed: Allergy & Precautions, NPO status , Patient's Chart, lab work & pertinent test results  Airway Mallampati: II  TM Distance: >3 FB Neck ROM: Full    Dental  (+) Dental Advisory Given   Pulmonary pneumonia (aspiration PNA with bilateral plueral effusions and bad COPD), unresolved, COPD, Current Smoker,    breath sounds clear to auscultation       Cardiovascular hypertension, + Peripheral Vascular Disease   Rhythm:Regular Rate:Normal     Neuro/Psych encephalopathy    GI/Hepatic Neg liver ROS, S/p hernia repair on 06/18/18 now with bowel obstruction.   Endo/Other  negative endocrine ROS  Renal/GU negative Renal ROS     Musculoskeletal   Abdominal   Peds  Hematology   Anesthesia Other Findings   Reproductive/Obstetrics                            Lab Results  Component Value Date   WBC 8.0 06/28/2018   HGB 9.2 (L) 06/28/2018   HCT 28.4 (L) 06/28/2018   MCV 97.3 06/28/2018   PLT 363 06/28/2018   Lab Results  Component Value Date   CREATININE 0.60 (L) 06/30/2018   BUN 45 (H) 06/30/2018   NA 137 06/30/2018   K 4.6 06/30/2018   CL 105 06/30/2018   CO2 24 06/30/2018    Anesthesia Physical  Anesthesia Plan  ASA: IV and emergent  Anesthesia Plan: General   Post-op Pain Management:    Induction: Intravenous, Rapid sequence and Cricoid pressure planned  PONV Risk Score and Plan: 2 and Ondansetron, Dexamethasone and Treatment may vary due to age or medical condition  Airway Management Planned: Oral ETT  Additional Equipment:   Intra-op Plan:   Post-operative Plan: Post-operative intubation/ventilation  Informed Consent: I have reviewed the patients History and Physical, chart, labs and discussed the procedure including the risks, benefits and alternatives for  the proposed anesthesia with the patient or authorized representative who has indicated his/her understanding and acceptance.     Dental advisory given  Plan Discussed with: CRNA and Anesthesiologist  Anesthesia Plan Comments:        Anesthesia Quick Evaluation

## 2018-06-30 NOTE — Progress Notes (Signed)
PHARMACY - ADULT TOTAL PARENTERAL NUTRITION CONSULT NOTE   Pharmacy Consult for TPN Indication: NPO for incarcerated bowel; too malnourished for surgery  Patient Measurements: Height: 5\' 2"  (157.5 cm) Weight: 75 lb 14.4 oz (34.4 kg) IBW/kg (Calculated) : 54.6 TPN AdjBW (KG): 40.5 Body mass index is 13.88 kg/m. 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 Presents this admission with SBO due to hernia, now s/p open inguinal hernia repair but CT still shows persistent SBO separate from hernia repair site - likely d/t edema/congestion from prior incarceration.   1/21: Discussed with CCS who will continue TPN for now due to level of malnourishment though diet increasing (poor tolerance) and having BMs  Central access:  PICC placed 1/14 TPN start date: 1/14   Significant events:  1/15: made NPO, chg MV, lytes to IV or in TPN 1/17: slight return of bowel function, advancing to clears 1/18 2 Boost/Resource Breeze supplements taken yesterday 1/19 100% 2 meals , 75 % 1 meal and 0% 2 meals recorded. 2 boost/resource breeze supplements charted, KUB ordered by CCS 1/20: multiple BMs reported, still with poor PO intake but some improvement, GF reports poor intake at baseline 1/21: PO intake increasing but still minimal and not meeting requirements- patient having a great deal of difficulty tolerating diet 1/22: patient with confusion overnight, pulled NG and PICC overnight, returning to OR for exlap due to persistent sbo then to ICU for prolonged mechanical ventilation  Insulin Requirements: 2 units SSI yesterday  Current Nutrition: TPN  IVF: none  Today, 06/30/2018:  Glucose (CBG goal 100-150) - range 122-209 sensitive  scale SSI (note low TBW). Solumedrol x1 on 1/14  Electrolytes - WNL  Renal - SCr stable WNL; BUN had decreased to WNL, now rising again  LFTs -  AST 58 1/20  TGs - WNL 1/20  Prealbumin - WNL 1/20  NUTRITIONAL GOALS                                                                                             RD recs: (1/13) Kcal:  1450-1650 kcal Protein:  70-85 grams Fluid:  >/= 1.4 L/day  PLAN  At 1800 today:  Continue TPN at 65 ml/hr; maxing out protein to allow for reduced dextrose  Adjusted TPN provides 84 g of protein and 1464 kcals still meeting 100% of patient needs  MVI, trace elements back in TPN  Famotidine in TPN  Electrolytes in TPN: Continue with increased Na, decreased K, phos; Cl:Ac continue 1:1  Sensitive SSI, q4h CBGs initially then consider back to q8h  TPN lab panels on Mondays & Thursdays  F/u ability to wean TPN and advance of diet  Ulice Dash, PharmD Clinical Pharmacist Pager # [phone]  06/30/2018 10:44 AM

## 2018-06-30 NOTE — Transfer of Care (Signed)
Immediate Anesthesia Transfer of Care Note  Patient: [name]  Procedure(s) Performed: EXPLORATORY LAPAROTOMY LYSISIS OF ADHESIONS (N/A Abdomen)  Patient Location: ICU  Anesthesia Type:General  Level of Consciousness: Patient remains intubated per anesthesia plan  Airway & Oxygen Therapy: Patient placed on Ventilator (see vital sign flow sheet for setting)  Post-op Assessment: Report given to RN and Post -op Vital signs reviewed and stable  Post vital signs: Reviewed and stable  Last Vitals:  Vitals Value Taken Time  BP    Temp    Pulse    Resp    SpO2      Last Pain:  Vitals:   06/30/18 0953  TempSrc: Oral  PainSc:       Patients Stated Pain Goal: 2 (54/00/86 7619)  Complications: No apparent anesthesia complications

## 2018-06-30 NOTE — Progress Notes (Signed)
Report given to [place] in [place] Stay. Pt to transport to sx soon. OR tech at bedside.

## 2018-06-30 NOTE — Care Management Important Message (Signed)
Important Message  Patient Details  Name: [name] MRN: [id number] Date of Birth: September 18, 1950   Medicare Important Message Given:  Yes    Kerin Salen 06/30/2018, 9:37 AMImportant Message  Patient Details  Name: [name] MRN: [id number] Date of Birth: [birth year]   Medicare Important Message Given:  Yes    Kerin Salen 06/30/2018, 9:36 AM

## 2018-06-30 NOTE — Progress Notes (Addendum)
Pt pulled PICC line out and stated he wanted to go home, pt currently being monitored and currently has tele-sitter. Pt has attempted to get out of bed multiple times during shift. Doctor notifed

## 2018-06-30 NOTE — Progress Notes (Addendum)
NAME:  [name], MRN:  [id number], DOB:  15-Apr-1951, LOS: 21 ADMISSION DATE:  06/13/2018, CONSULTATION DATE:  06/18/18 REFERRING MD:  Ernst Spell CHIEF COMPLAINT:  Hypoxia, agitation   Brief History   68 yo M quit smoking sev weeks pta with COPD GOLD III criteria 2016  s/p inguinal hernia repair 1/10, after presenting 1/5 with abdominal pain, bilateral inguinal hernias with small bowel obstruction. Underwent hernia repair 1/10. Post operatively patient is agitated and had SpO2 high 80s to low 90s; PCCM asked to consult for hypoxia.  Past Medical History  COPD - PFT's  11/17/14 FEV1 1/25 (49 % ) ratio 49  with DLCO  35 % corrects to 43 % for alv volume and classic copd curvature to f/v  , HTN, PVD, NSCLC s/p SBRT to left lung  EtOH abuse, Recurrent inguinal hernia  Significant Hospital Events   1/5 Admission 1/10 L inguinal hernia repair. Transfer to SDU for hypoxia post-operative 1/11 mild sedation from ativan, on 5L  1/15 Confusion overnight > started on steroids 1/14, started on TNA 1/22: Ongoing abdominal ileus versus obstruction.  Surgical service is planning bringing back for exploration.  Pulmonary asked to see in regards to abnormal CT findings specifically right middle lobe collapse/atelectasis and persistent bilateral effusions and basilar atelectasis  Consults:  Surgery PCCM  Procedures:  1/10 L inguinal hernia repair  Significant Diagnostic Tests:  1/5 CT a/p >> bilatera hiatal hernia, distal small bowel obstruction. Transition point located within L inguinal hernia. R hiatal hernia containing ascites  1/10 CXR >> RLL opacity  Micro Data:  1/10 MRSA >> neg  Antimicrobials:  Zosyn 1/22 (for possible aspiration).   Interim history/subjective:  No distress.  Cough mechanics are extremely poor, very weak cough nonproductive.  He did pull his NG tube out last night.  He is pleasantly confused.  Denies significant pain.  Respiratory therapy notes asymptomatic hypoxia with  exertion, saturations on room air down to 78% during ambulation Objective   Blood pressure (Abnormal) 143/88, pulse (Abnormal) 102, temperature 99 F (37.2 C), temperature source Oral, resp. rate 20, height 5\' 2"  (1.575 m), weight 34.4 kg, SpO2 94 %. on 2.5 lpm         Intake/Output Summary (Last 24 hours) at 06/30/2018 0818 Last data filed at 06/30/2018 0600 Gross per 24 hour  Intake 1924.45 ml  Output 650 ml  Net 1274.45 ml   Filed Weights   06/18/18 1437 06/22/18 0845 06/25/18 1418  Weight: 40.5 kg 38 kg 34.4 kg   Examination:  General: This is a 68 year old male patient who looks much older than stated age he is currently pleasantly confused and in no acute distress HEENT temporal wasting is appreciated.  Mucous membranes are moist.  No JVD. Pulmonary: Diffuse scattered rhonchi with tactile fremitus.  No accessory use currently. Cardiac: Regular rate and rhythm without murmur rub or gallop Abdomen: Soft, slightly distended.  He has a notable right inguinal hernia, and left inguinal hernia surgical site is unremarkable. GU: Voids Neuro: Awake, oriented to person place and time currently but remains confused intermittently  Extremities: Warm dry brisk capillary refill  Resolved Hospital Problem list   SBO   Assessment & Plan:   Chronic versus acute on chronic hypoxic respiratory failure.  This is multifactorial: Persistent right middle lobe atelectasis/collapse with air bronchograms, bibasilar atelectasis, underlying COPD, and bilateral right greater than left effusions -His cough mechanics are very poor.  Evaluation of CT imaging is fairly similar to evaluation on  the 13th of this month.  There is no significant airway involvement, would be more inclined to think right middle lobe is secondary to intermittent aspiration in the setting of his bowel obstruction Plan Continuing supplemental oxygen as needed aiming for pulse oximetry greater than 90% Aggressive pulmonary hygiene  measures: Incentive spirometry and flutter valve status post extubation Continuing bronchodilators Adding hypertonic saline neb Day #1 Zosyn for possible aspiration  anticipating possible need for prolonged mechanical ventilation following surgery given poor pulmonary mechanics, severe deconditioning, and malnutrition For now would defer bronchoscopy, would only consider this if we were unable to extubate In regards to the pleural effusions we will follow these radiographically, once hemodynamically stable from surgery we will again try diuresis as this worked before I suspect they are sympathetic from his abdominal process, however if they persist we may need to consider thoracentesis   Acute metabolic encephalopathy, superimposed on 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 Plan Supportive care  Careful with narcotics and benzodiazepines, will try to avoid benzos altogether  Frequent reorientation  Safety sitter at bedside   fluid and electrolyte imbalance: intermittent  Plan Monitor and replace as needed  H/o lung cancer Plan Will need repeat imaging about 3 to 4 weeks after discharge Currently from a cancer standpoint not a candidate for bronchoscopy for diagnostic says nutritional status and functional status at this point is too poor for possible cancer treatment if we were to find any  H/o PAD s/p stent to Right external iliac on 10/22 Plan Resume Plavix when okay with surgical services  Malnutrition Plan Need to resume TPA, patient pulled his PICC line out this morning  Best practice    Diet: N.p.o., to resume TNA after his central access placed on 1/22 Activity: Bedrest, advance as tolerated DVT: SCDs PUD: H2B Glycemic control: Currently on sliding scale insulin  Disposition: He is awaiting return to operating room, will plan on recovering him in the intensive care.  He may indeed need prolonged mechanical ventilation given his poor ventilatory mechanics at baseline  and poor cough mechanics.  I do think he needs to go to surgery, certainly his pulmonary disease should not prevent this, with ongoing obstruction his risk for aspiration rises.  Erick Colace ACNP-BC [place] Pager # [phone] OR # [phone] if no answer   Erick Colace ACNP-BC [place] Pager # [phone] OR # [phone] if no answer

## 2018-06-30 NOTE — Progress Notes (Signed)
Updated [place], RN, in Short Stay.

## 2018-06-30 NOTE — Op Note (Signed)
06/13/2018 - 06/30/2018  12:19 PM  PATIENT:  [name]  68 y.o. male  PRE-OPERATIVE DIAGNOSIS:  bowel obstruction  POST-OPERATIVE DIAGNOSIS:  bowel obstruction  PROCEDURE:  Procedure(s): EXPLORATORY LAPAROTOMY LYSISIS OF ADHESIONS (N/A)  SURGEON:  Surgeon(s) and Role:    * Jovita Kussmaul, MD - Primary    * Leighton Ruff, MD - Assisting  PHYSICIAN ASSISTANT:   ASSISTANTS: Dr. Marcello Moores   ANESTHESIA:   general  EBL:  100 mL   BLOOD ADMINISTERED:none  DRAINS: none   LOCAL MEDICATIONS USED:  NONE  SPECIMEN:  No Specimen  DISPOSITION OF SPECIMEN:  N/A  COUNTS:  YES  TOURNIQUET:  * No tourniquets in log *  DICTATION: .Dragon Dictation   After informed consent was obtained the patient was brought to the operating room and placed in the supine position on the operating table.  After adequate induction of general anesthesia the patient's abdomen was prepped with ChloraPrep, allowed to dry, and draped in usual sterile manner.  A midline incision was made with a 10 blade knife.  The incision was carried through the skin and subcutaneous tissue sharply with electrocautery until the linea alba was identified.  The linea alba was also incised with the electrocautery.  The preperitoneal space was probed bluntly with a hemostat until the peritoneum was opened and access was gained to the abdominal cavity.  The rest of the incision was then opened under direct vision.  There was some filmy adhesions of omentum and bowel to the abdominal wall.  This was all taken down by either sharp dissection with the Metzenbaum scissors or blunt finger dissection.  Once the abdominal wall was clear of any adhesions we were then able to run the small bowel from the ligament of Treitz to the ileocecal valve.  Initially the proximal jejunum was decompressed.  We then identified an area of adhesion to the base of the mesentery.  This gave the appearance almost of a volvulus of the small bowel.  These adhesions  were lysed sharply with Metzenbaum scissors and the bowel was unrotated.  This seemed to resolve the obstruction at this location.  The rest of the small bowel was then run and another adhesive band causing an obstruction was identified and lysed sharply with electrocautery.  The rest of the small intestine was then run all the way to the right colon and no other sites of obstruction were identified.  Some of the small bowel contents were able to be milked back towards the stomach and removed with the NG tube which was in good position.  Once this was accomplished then the obstructions were relieved.  The abdomen was irrigated with copious amounts of saline.  The small bowel was placed back into the abdominal cavity.  The fascia of the anterior abdominal wall was closed with 2 running #1 double-stranded looped PDS sutures as well as interrupted #1 Novafil's.  The subcutaneous tissue was then irrigated with saline.  The skin was closed with staples.  Sterile dressings were applied.  The patient tolerated the procedure well.  At the end of the case all needle sponge and instrument counts were correct.  The patient was then taken to the ICU intubated but in stable condition.  PLAN OF CARE: Admit to inpatient   PATIENT DISPOSITION:  ICU - intubated and hemodynamically stable.   Delay start of Pharmacological VTE agent (>24hrs) due to surgical blood loss or risk of bleeding: no

## 2018-06-30 NOTE — Anesthesia Postprocedure Evaluation (Signed)
Anesthesia Post Note  Patient: [name]  Procedure(s) Performed: EXPLORATORY LAPAROTOMY LYSISIS OF ADHESIONS (N/A Abdomen)     Patient location during evaluation: SICU Anesthesia Type: General Level of consciousness: sedated Pain management: pain level controlled Vital Signs Assessment: post-procedure vital signs reviewed and stable Respiratory status: patient remains intubated per anesthesia plan Cardiovascular status: stable Postop Assessment: no apparent nausea or vomiting Anesthetic complications: no    Last Vitals:  Vitals:   06/30/18 0953 06/30/18 1244  BP:    Pulse:    Resp: (!) 24   Temp: 36.6 C   SpO2:  100%    Last Pain:  Vitals:   06/30/18 0953  TempSrc: Oral  PainSc:                  Tiajuana Amass

## 2018-06-30 NOTE — Anesthesia Procedure Notes (Signed)
Procedure Name: Intubation Date/Time: 06/30/2018 10:52 AM Performed by: Debborah Alonge D, CRNA Pre-anesthesia Checklist: Patient identified, Emergency Drugs available, Suction available and Patient being monitored Patient Re-evaluated:Patient Re-evaluated prior to induction Oxygen Delivery Method: Circle system utilized Preoxygenation: Pre-oxygenation with 100% oxygen Induction Type: IV induction, Rapid sequence and Cricoid Pressure applied Laryngoscope Size: Mac and 4 Tube type: Subglottic suction tube Tube size: 7.5 mm Number of attempts: 1 Airway Equipment and Method: Stylet Placement Confirmation: ETT inserted through vocal cords under direct vision,  positive ETCO2 and breath sounds checked- equal and bilateral Secured at: 22 cm Tube secured with: Tape Dental Injury: Teeth and Oropharynx as per pre-operative assessment

## 2018-06-30 NOTE — Progress Notes (Signed)
IV team RN called this RN in regards to PICC placement. IV team RN stated that IV team would attempt to place PICC line 07/01/2018. Per IV team RN, TPN will be restarted then.  Will continue to monitor.

## 2018-07-01 ENCOUNTER — Encounter (HOSPITAL_COMMUNITY): Payer: Self-pay | Admitting: General Surgery

## 2018-07-01 LAB — CBC
HCT: 24 % — ABNORMAL LOW (ref 39.0–52.0)
HEMOGLOBIN: 7.4 g/dL — AB (ref 13.0–17.0)
MCH: 31.8 pg (ref 26.0–34.0)
MCHC: 30.8 g/dL (ref 30.0–36.0)
MCV: 103 fL — ABNORMAL HIGH (ref 80.0–100.0)
Platelets: 316 10*3/uL (ref 150–400)
RBC: 2.33 MIL/uL — AB (ref 4.22–5.81)
RDW: 15.8 % — ABNORMAL HIGH (ref 11.5–15.5)
WBC: 7.5 10*3/uL (ref 4.0–10.5)
nRBC: 0 % (ref 0.0–0.2)

## 2018-07-01 LAB — COMPREHENSIVE METABOLIC PANEL
ALT: 55 U/L — ABNORMAL HIGH (ref 0–44)
AST: 38 U/L (ref 15–41)
Albumin: 2.6 g/dL — ABNORMAL LOW (ref 3.5–5.0)
Alkaline Phosphatase: 61 U/L (ref 38–126)
Anion gap: 6 (ref 5–15)
BUN: 42 mg/dL — ABNORMAL HIGH (ref 8–23)
CO2: 20 mmol/L — ABNORMAL LOW (ref 22–32)
Calcium: 7.9 mg/dL — ABNORMAL LOW (ref 8.9–10.3)
Chloride: 110 mmol/L (ref 98–111)
Creatinine, Ser: 1.03 mg/dL (ref 0.61–1.24)
GFR calc non Af Amer: >60 mL/min (ref >60)
Glucose, Bld: 133 mg/dL — ABNORMAL HIGH (ref 70–99)
Potassium: 5 mmol/L (ref 3.5–5.1)
Sodium: 136 mmol/L (ref 135–145)
Total Bilirubin: 0.4 mg/dL (ref 0.3–1.2)
Total Protein: 5 g/dL — ABNORMAL LOW (ref 6.5–8.1)

## 2018-07-01 LAB — HEMOGLOBIN AND HEMATOCRIT, BLOOD
HCT: 30.1 % — ABNORMAL LOW (ref 39.0–52.0)
HEMOGLOBIN: 9.6 g/dL — AB (ref 13.0–17.0)

## 2018-07-01 LAB — GLUCOSE, CAPILLARY
GLUCOSE-CAPILLARY: 90 mg/dL (ref 70–99)
Glucose-Capillary: 103 mg/dL — ABNORMAL HIGH (ref 70–99)
Glucose-Capillary: 118 mg/dL — ABNORMAL HIGH (ref 70–99)
Glucose-Capillary: 176 mg/dL — ABNORMAL HIGH (ref 70–99)
Glucose-Capillary: 176 mg/dL — ABNORMAL HIGH (ref 70–99)
Glucose-Capillary: 177 mg/dL — ABNORMAL HIGH (ref 70–99)
Glucose-Capillary: 82 mg/dL (ref 70–99)

## 2018-07-01 LAB — PREPARE RBC (CROSSMATCH)

## 2018-07-01 LAB — PHOSPHORUS: Phosphorus: 4.8 mg/dL — ABNORMAL HIGH (ref 2.5–4.6)

## 2018-07-01 LAB — MAGNESIUM: Magnesium: 1.8 mg/dL (ref 1.7–2.4)

## 2018-07-01 MED ORDER — SODIUM CHLORIDE 0.9% FLUSH
10.0000 mL | INTRAVENOUS | Status: DC | PRN
  Administered 2018-07-05: 10 mL
  Filled 2018-07-01: qty 40

## 2018-07-01 MED ORDER — CHLORHEXIDINE GLUCONATE CLOTH 2 % EX PADS
6.0000 | MEDICATED_PAD | Freq: Every day | CUTANEOUS | Status: DC
  Administered 2018-07-02 – 2018-07-07 (×6): 6 via TOPICAL

## 2018-07-01 MED ORDER — TRAVASOL 10 % IV SOLN
INTRAVENOUS | Status: AC
  Administered 2018-07-01: 18:00:00 via INTRAVENOUS
  Filled 2018-07-01: qty 842.4

## 2018-07-01 MED ORDER — SODIUM CHLORIDE 0.9% IV SOLUTION: Freq: Once | INTRAVENOUS | Status: DC

## 2018-07-01 MED ORDER — SODIUM CHLORIDE 0.9% FLUSH
10.0000 mL | Freq: Two times a day (BID) | INTRAVENOUS | Status: DC
  Administered 2018-07-01 – 2018-07-05 (×9): 10 mL

## 2018-07-01 MED ORDER — DEXTROSE-NACL 5-0.9 % IV SOLN
INTRAVENOUS | Status: AC
  Administered 2018-07-01: 09:00:00 via INTRAVENOUS

## 2018-07-01 NOTE — Progress Notes (Signed)
CPT on hold due to pt in pain. RN at bedside.

## 2018-07-01 NOTE — Progress Notes (Signed)
Nutrition Follow-up  DOCUMENTATION CODES:   Underweight, Severe malnutrition in context of chronic illness  INTERVENTION:  - TPN re-start tonight. - Will continue to monitor medical course.    NUTRITION DIAGNOSIS:   Severe Malnutrition related to chronic illness(COPD) as evidenced by energy intake < or equal to 75% for > or equal to 1 month, severe fat depletion, severe muscle depletion, percent weight loss. 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 Presents this admission with SBO due to hernia.   Patient is intubated with OGT to LIS with 50 ml output this shift. PICC replaced this AM after he pulled his first PICC; he has double lumen in L brachial. No family/visitors present at bedside. Spoke with RN who reports plan to re-start TPN tonight.   Patient is POD #1 ex lap with LOA--2 sites of closed loop obstructions.    Patient is currently intubated on ventilator support MV: 7.6 L/min Temp (24hrs), Avg:97.7 F (36.5 C), Min:97.2 F (36.2 C), Max:97.9 F (36.6 C) Propofol: now off BP: 156/83 and MAP: 106   Medications reviewed; sliding scale novolog,  Labs reviewed; CBGs: 176, 118, and 176 mg/dl today, BUN: 42 mmol/l, Ca: 7.9 mg/dl, Phos: 4.8 mg/dl, AST elevated. IVF; D5-NS @ 100 ml/hr (408 kcal). Drip; fentanyl @ 75 mcg/hr.      Diet Order:   Diet Order            Diet NPO time specified  Diet effective now              EDUCATION NEEDS:   Education needs have been addressed  Skin:  Skin Assessment: Skin Integrity Issues: Skin Integrity Issues::  Incisions Incisions: abdominal (1/22)  Last BM:  1/20  Height:   Ht Readings from Last 1 Encounters:  06/30/18 5\' 2"  (1.575 m)    Weight:   Wt Readings from Last 1 Encounters:  06/30/18 36.9 kg    Ideal Body Weight:  53.6 kg  BMI:  Body mass index is 14.88 kg/m.  Estimated Nutritional Needs:   Kcal:  1128 kcal  Protein:  74-92 grams (2-2.5 grams/kg)  Fluid:  1.5L/day     Jarome Matin, MS, RD, LDN, CNSC Inpatient Clinical Dietitian Pager # [phone] After hours/weekend pager # [phone]

## 2018-07-01 NOTE — Progress Notes (Signed)
Peripherally Inserted Central Catheter/Midline Placement  The IV Nurse has discussed with the patient and/or persons authorized to consent for the patient, the purpose of this procedure and the potential benefits and risks involved with this procedure.  The benefits include less needle sticks, lab draws from the catheter, and the patient may be discharged home with the catheter. Risks include, but not limited to, infection, bleeding, blood clot (thrombus formation), and puncture of an artery; nerve damage and irregular heartbeat and possibility to perform a PICC exchange if needed/ordered by physician.  Alternatives to this procedure were also discussed.  Bard Power PICC patient education guide, fact sheet on infection prevention and patient information card has been provided to patient /or left at bedside.    PICC/Midline Placement Documentation  PICC Double Lumen 07/01/18 PICC Left Brachial 37 cm 0 cm (Active)  Indication for Insertion or Continuance of Line Administration of hyperosmolar/irritating solutions (i.e. TPN, Vancomycin, etc.) 07/01/2018 10:00 AM  Exposed Catheter (cm) 0 cm 07/01/2018 10:00 AM  Site Assessment Clean;Dry;Intact 07/01/2018 10:00 AM  Lumen #1 Status Flushed;Saline locked;Blood return noted 07/01/2018 10:00 AM  Lumen #2 Status Flushed;Saline locked;Blood return noted 07/01/2018 10:00 AM  Dressing Type Transparent;Securing device 07/01/2018 10:00 AM  Dressing Status Clean;Dry;Intact;Antimicrobial disc in place 07/01/2018 10:00 AM  Line Care Connections checked and tightened 07/01/2018 10:00 AM  Dressing Intervention New dressing 07/01/2018 10:00 AM  Dressing Change Due 07/08/18 07/01/2018 10:00 AM       Virgilio Belling 07/01/2018, 10:10 AM

## 2018-07-01 NOTE — Progress Notes (Signed)
PHARMACY - ADULT TOTAL PARENTERAL NUTRITION CONSULT NOTE   Pharmacy Consult for TPN Indication: NPO for incarcerated bowel; too malnourished for surgery  Patient Measurements: Height: 5\' 2"  (157.5 cm) Weight: 81 lb 5.6 oz (36.9 kg) IBW/kg (Calculated) : 54.6 TPN AdjBW (KG): 36.9 Body mass index is 14.88 kg/m. 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 Presents this admission with SBO due to hernia, now s/p open inguinal hernia repair but CT still shows persistent SBO separate from hernia repair site - likely d/t edema/congestion from prior incarceration.  1/21: Pharmacy discussed with CCS who will continue TPN for now due to level of malnourishment though diet increasing (poor tolerance) and having BMs  Central access:  PICC placed 1/14 - was removed 1/21 overnight by pt. PICC placed again on 1/23 TPN start date: 1/14   Significant events:  1/15: made NPO, chg MV, lytes to IV or in TPN 1/17: slight return of bowel function, advancing to clears 1/18 2 Boost/Resource Breeze supplements taken yesterday 1/19 100% 2 meals , 75 % 1 meal and 0% 2 meals recorded. 2 boost/resource breeze supplements charted, KUB ordered by CCS 1/20: multiple BMs reported, still with poor PO intake but some improvement, GF reports poor intake at baseline 1/21: PO intake increasing but still minimal and not meeting requirements- patient having a great deal of difficulty tolerating diet 1/22: patient with confusion overnight, pulled NG and PICC overnight, returned to OR for exlap with lysis of adhesions due to persistent sbo then to ICU for mechanical ventilation 1/23: PICC line placed. Propofol was started on 1/22 but has been weaned off. Currently not  infusing.   Insulin Requirements: 5 units SSI in past 24 hours  Current Nutrition: TPN  IVF: D5 NS w/KCl 20 mEq/L @ 100 mL/hr per surgery  Today, 07/01/2018:  Glucose (CBG goal 100-150) - range 122-176 sensitive scale SSI (note low TBW). Solumedrol x1 on 1/14  Electrolytes:  Na (136), Cl (110), corrected calcium (9), Mg (1.8) - WNL  Phos (4.8) elevated. K (5) is upper limit of normal range  Renal - SCr WNL but increased; BUN had decreased to WNL, now elevated but stable  LFTs -  AST WNL, ALT 55 on  1/23  TGs - WNL 1/22  Prealbumin - WNL 1/20  NUTRITIONAL GOALS                                                                                 RD recs: (1/13) Kcal:  1450-1650 kcal  Protein:  70-85 grams Fluid:  >/= 1.4 L/day  PLAN                                                                                                               Now:  Given K = 5, will remove KCl from maintenance fluids but continue rate ordered by MD. Ordered D5 NS @ 100 mL/hr  At 1800 today:  Resume TPN at 65 ml/hr; maxing out protein to allow for reduced dextrose  TPN provides 84 g of protein and 1465 kcals still meeting 100% of patient needs  Discontinue maintenance fluids  MVI, trace elements in TPN  Famotidine in TPN  Electrolytes in TPN: Continue with increased Na, remove K and phos; Cl:Ac changed to 1:2  Sensitive SSI, q4h CBGs initially then consider back to q6h  TPN lab panels on Mondays & Thursdays  Recheck electrolytes with AM labs tomorrow  F/u ability to wean TPN and advance of diet  Lenis Noon, PharmD 07/01/18 11:11 AM

## 2018-07-01 NOTE — Progress Notes (Signed)
OT Cancellation Note  Patient Details Name: [name] MRN: [id number] DOB: [birth year]   Cancelled Treatment:    Reason Eval/Treat Not Completed: Other (comment);Medical issues which prohibited therapy    HgB 7.4 scheduled to receive transfusion as well as on mech vent.  Will continue to follow.   Giddings, Mickel Baas, OT Acute Rehabilitation Services [phone] [phone]    07/01/2018, 2:38 PM

## 2018-07-01 NOTE — Progress Notes (Signed)
PT Cancellation Note  Patient Details Name: [name] MRN: [id number] DOB: 21-Aug-1950   Cancelled Treatment:     HgB 7.4 scheduled to receive transfusion as well as on mech vent.  Will continue to follow.    Rica Koyanagi  PTA Acute  Rehabilitation Services Pager      [phone] Office      [phone]

## 2018-07-01 NOTE — Progress Notes (Signed)
NAME:  [name], MRN:  [id number], DOB:  11-24-1950, LOS: 58 ADMISSION DATE:  06/13/2018, CONSULTATION DATE:  06/18/18 REFERRING MD:  Ernst Spell CHIEF COMPLAINT:  Hypoxia, agitation   Brief History   68 yo M quit smoking sev weeks pta with COPD GOLD III criteria 2016  s/p inguinal hernia repair 1/10, after presenting 1/5 with abdominal pain, bilateral inguinal hernias with small bowel obstruction. Underwent hernia repair 1/10. Post operatively patient is agitated and had SpO2 high 80s to low 90s; PCCM asked to consult for hypoxia. Status post exploratory laparotomy on 06/30/2018, currently on ventilator  Past Medical History  COPD - PFT's  11/17/14 FEV1 1/25 (49 % ) ratio 49  with DLCO  35 % corrects to 43 % for alv volume and classic copd curvature to f/v  , HTN, PVD, NSCLC s/p SBRT to left lung  EtOH abuse, Recurrent inguinal hernia  Significant Hospital Events   1/5 Admission 1/10 L inguinal hernia repair. Transfer to SDU for hypoxia post-operative 1/11 mild sedation from ativan, on 5L  1/15 Confusion overnight > started on steroids 1/14, started on TNA 1/22: Ongoing abdominal ileus versus obstruction.  Surgical service is planning bringing back for exploration.  Pulmonary asked to see in regards to abnormal CT findings specifically right middle lobe collapse/atelectasis and persistent bilateral effusions and basilar atelectasis 1/23: Awake, interactive, will be receiving transfusion today  Consults:  Surgery PCCM  Procedures:  1/10 L inguinal hernia repair 1/22/exploratory laparotomy and adhesiolysis Significant Diagnostic Tests:  1/5 CT a/p >> bilatera hiatal hernia, distal small bowel obstruction. Transition point located within L inguinal hernia. R hiatal hernia containing ascites  1/10 CXR >> RLL opacity  Micro Data:  1/10 MRSA >> neg  Antimicrobials:  Zosyn 1/22 (for possible aspiration).   Interim history/subjective:  No distress. Post exploratory laparotomy On  vent, awake, not in pain  Objective   Blood pressure (!) 144/82, pulse (!) 114, temperature 98 F (36.7 C), temperature source Oral, resp. rate 16, height 5\' 2"  (1.575 m), weight 36.9 kg, SpO2 100 %. on 2.5 lpm     Vent Mode: PRVC FiO2 (%):  [30 %-100 %] 30 % Set Rate:  [12 bmp-16 bmp] 16 bmp Vt Set:  [430 mL-450 mL] 450 mL PEEP:  [5 cmH20-8 cmH20] 8 cmH20 Plateau Pressure:  [16 cmH20-20 cmH20] 19 cmH20   Intake/Output Summary (Last 24 hours) at 07/01/2018 1225 Last data filed at 07/01/2018 0900 Gross per 24 hour  Intake 2871.19 ml  Output 985 ml  Net 1886.19 ml   Filed Weights   06/22/18 0845 06/25/18 1418 06/30/18 1300  Weight: 38 kg 34.4 kg 36.9 kg   Examination:  General: This is a 68 year old male patient who looks much older than stated age, comfortable on the vent HEENT temporal wasting.  Mucous membranes are moist.  No JVD. Pulmonary: Diffuse scattered rhonchi with tactile fremitus.  No accessory use currently. Cardiac: S1-S2, no murmur Abdomen: Soft, nontender, postsurgical GU: Voids, fair output Neuro: Awake, moves all extremities  extremities: Warm dry brisk capillary refill  Resolved Hospital Problem list   SBO -post exploratory laparotomy  Assessment & Plan:   Chronic versus acute on chronic hypoxic respiratory failure.  This is multifactorial: Persistent right middle lobe atelectasis/collapse with air bronchograms, bibasilar atelectasis, underlying COPD, and bilateral right greater than left effusions -His cough mechanics are very poor.  Evaluation of CT imaging is fairly similar to evaluation on the 13th of this month.  There is no significant  airway involvement, would be more inclined to think right middle lobe is secondary to intermittent aspiration in the setting of his bowel obstruction -Now bowel obstruction has been relieved Plan Continuing ventilator support We will attempt to wean and extubate in a.m. Aggressive pulmonary hygiene measures: Incentive  spirometry and flutter valve status post extubation Continuing bronchodilators Adding hypertonic saline neb Day #2 Zosyn for possible aspiration  anticipating possible need for prolonged mechanical ventilation following surgery given poor pulmonary mechanics, severe deconditioning, and malnutrition  Defer bronchoscopy, would only consider this if we were unable to extubate In regards to the pleural effusions we will follow these radiographically, once hemodynamically stable from surgery we will again try diuresis as this worked before I suspect they are sympathetic from his abdominal process, however if they persist we may need to consider thoracentesis  Acute metabolic encephalopathy, superimposed on 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 Plan Supportive care  Careful with narcotics and benzodiazepines, will try to avoid benzos altogether  Frequent reorientation  Safety sitter at bedside   fluid and electrolyte imbalance: intermittent  Plan Monitor and replace as needed  H/o lung cancer Plan Will need repeat imaging about 3 to 4 weeks after discharge Currently from a cancer standpoint not a candidate for bronchoscopy for diagnostic says nutritional status and functional status at this point is too poor for possible cancer treatment if we were to find any  H/o PAD s/p stent to Right external iliac on 10/22 Plan Resume Plavix when okay with surgical services  Malnutrition Plan Need to resume TPA, PICC line was replaced today  Best practice    Diet: We will start TPN today  Activity: Bedrest, advance as tolerated DVT: SCDs PUD: H2B Glycemic control: Currently on sliding scale insulin  Disposition: We will try him on CPAP today He may indeed need prolonged mechanical ventilation given his poor ventilatory mechanics at baseline and poor cough mechanics.  I do think he needs to go to surgery, certainly his pulmonary disease should not prevent this, with ongoing obstruction his  risk for aspiration rises.  Sherrilyn Rist, MD  PCCM

## 2018-07-01 NOTE — Progress Notes (Addendum)
1 Day Post-Op    CC: abdominal pain  Subjective: Sedated on the vent but doing well.  Sedation just decreased some and is is nodding his head and squeezed some with his hand.  Labs are pending,  Vent at 40% Rate 16, TV 450 and 8 PEEP.   Midline incision is OK. Not distended as in the past, no BS.   Objective: Vital signs in last 24 hours: Temp:  [97.2 F (36.2 C)-97.9 F (36.6 C)] 97.9 F (36.6 C) (01/23 0400) Pulse Rate:  [91-193] 114 (01/22 2000) Resp:  [12-24] 21 (01/22 2000) BP: (104-251)/(72-127) 143/75 (01/22 2000) SpO2:  [64 %-100 %] 100 % (01/23 0332) FiO2 (%):  [40 %-100 %] 40 % (01/23 0332) Weight:  [36.9 kg] 36.9 kg (01/22 1300) Last BM Date: 06/28/18 NPO 3583 IV Urine 835 NG 150 recorded - brown thick contents irrigated early post op Other 1100 from NG in OR. Afebrile hypotensive ABG yesterday 1420 hrs post op PH 7.07 PCO2 69 PO2 55 HCO3 19 Labs for today pending Intake/Output from previous day: 01/22 0701 - 01/23 0700 In: 3583.9 [I.V.:3098.8; NG/GT:50; IV Piggyback:385.1] Out: 2185 [Urine:835; Emesis/NG output:150; Blood:100] Intake/Output this shift: No intake/output data recorded.  General appearance: sedated on the Vent but nodding, eyes closed. Resp: clear to auscultation bilaterally and anterior exam on Vent GI: distension is better, NG in place not draining much right now.  Midline incision is OK.  No BS  Lab Results:  No results for input(s): WBC, HGB, HCT, PLT in the last 72 hours.  BMET Recent Labs    06/29/18 0546 06/30/18 0434  NA 132* 137  K 5.0 4.6  CL 104 105  CO2 20* 24  GLUCOSE 203* 122*  BUN 38* 45*  CREATININE 0.60* 0.60*  CALCIUM 9.1 9.0   PT/INR No results for input(s): LABPROT, INR in the last 72 hours.  Recent Labs  Lab 06/25/18 1429 06/28/18 0447  AST 44* 58*  ALT 42 58*  ALKPHOS 63 78  BILITOT 0.1* 0.2*  PROT 6.5 6.2*  ALBUMIN 3.0* 3.0*     Lipase     Component Value Date/Time   LIPASE 53 (H)  06/13/2018 1924     Medications: . budesonide (PULMICORT) nebulizer solution  0.5 mg Nebulization BID  . chlorhexidine gluconate (MEDLINE KIT)  15 mL Mouth Rinse BID  . chlorhexidine gluconate (MEDLINE KIT)  15 mL Mouth Rinse BID  . hydrocortisone-pramoxine   Rectal QID  . insulin aspart  0-9 Units Subcutaneous Q4H  . ipratropium-albuterol  3 mL Nebulization Q4H  . mouth rinse  15 mL Mouth Rinse 10 times per day  . sodium chloride HYPERTONIC  4 mL Nebulization Q12H   Anti-infectives (From admission, onward)   Start     Dose/Rate Route Frequency Ordered Stop   06/30/18 1000  piperacillin-tazobactam (ZOSYN) IVPB 3.375 g     3.375 g 12.5 mL/hr over 240 Minutes Intravenous Every 8 hours 06/30/18 0920     06/22/18 1300  cefTRIAXone (ROCEPHIN) 1 g in sodium chloride 0.9 % 100 mL IVPB     1 g 200 mL/hr over 30 Minutes Intravenous Every 24 hours 06/22/18 1202 06/26/18 1334   06/22/18 1300  azithromycin (ZITHROMAX) 500 mg in sodium chloride 0.9 % 250 mL IVPB     500 mg 250 mL/hr over 60 Minutes Intravenous Every 24 hours 06/22/18 1202 06/26/18 1513   06/18/18 0600  ceFAZolin (ANCEF) IVPB 2g/100 mL premix  Status:  Discontinued     2  g 200 mL/hr over 30 Minutes Intravenous On call to O.R. 06/18/18 0531 06/18/18 1211     . sodium chloride 35 mL/hr at 06/29/18 2125  . sodium chloride 10 mL/hr at 07/01/18 0500  . dextrose 5 % and 0.9 % NaCl with KCl 20 mEq/L 100 mL/hr at 07/01/18 0500  . fentaNYL infusion INTRAVENOUS 100 mcg/hr (07/01/18 0500)  . piperacillin-tazobactam (ZOSYN)  IV Stopped (07/01/18 0558)  . propofol (DIPRIVAN) infusion 5 mcg/kg/min (07/01/18 0703)  . TPN ADULT (ION)     Assessment/Plan  Hx Non-small cell lung CA HX Tobacco abuse  Acute metabolic encephalopathy/postop delirium -fall/ongoing confusion 11/12. Neg Ct head -Medicine following - sedated and on Vent today Hyponatremia/Hypokalemia/Hypomagnesmia History of alcohol abuse Hypertension PAD/critical left  limb ischemia- non healing leftleft first metatarsal head -Vascular has seen the patient SevereMalnutrition(BMI 16.33) >> 14.88 today - prealbuminimproved from 11.3 -> 22.1 Internal hemorrhoids -Seen by Dr. Thana Farr in April 2019, s/p rubber band ligation -Analapram prn. Outpt follow up Anemia - chronic illness  11.5>> 9.2>>7.4 today  Incarcerated recurrent left inguinal hernia, small bowel obstruction open recurrent left inguinal hernia repair with mesh, Dr. Harlow Asa, 06/18/18, POD# 13 Exploratory laparotomy lysis of adhesions, with 2 sites of closed loop obstructions 06/30/18, Dr. Fredrik Cove III  -Admitted 1/5 for incarcerated recurrent left inguinal hernia with small bowel obstructionw transition LLQ -CTA 1/13 with persistent high grade SBO.Thought to berelated to edema in the incarcerated knuckle of bowel he had - wasseen intraoperatively and appeared clearly viable. -Started on TPN1/14 -Diet advancedto soft on 1/18 -1/20 -Improving clinically. Calorie count ordered -1/21 - Inc abdominal pain, distension and nausea -Ct yesterday with SBO pattern with no discrete transition zone. NG tube was placed.  -Patient confused this morning. Removed NG tube and PICC line overnight -Continued abdominal distension, w/ hypoactive BS, small bowel distension up to 7cm on film this AM. Will discuss with Dr. Marlou Starks. Possible Ex Lap given ongoing, unresolved SBO 06/30/18  COPD  -Acute respiratory illness post-op- followed by Pulm -CT 1/21 with right middle lobe collapse and small right pleural effusion.  -Pulm re consulted and will follow. Appreciate this.   - Post op ARF - on Vent  FEN:NPO;TPN on hold (picc line removed) ID: Preop Ancef; Azithromycin & Rocephin (COPD)1/14- 1/18;  Zosyn 1/22 >>day 2 DVT: Lovenox will restart this Afternoon - labs pending Follow-up with Dr.Gerkin  PLan:  BP is better right now, will defer to CCM, check labs, If H/H is OK restart Lovenox  Later  today.  PICC line ordered, awaiting IV team, Restart TNA when PICC is replaced. H/H is down I will type and screen, transfuse 1 unit today.   LOS: 17 days   Seen and agree. abd distended, incision ok. On vent. Appreciate CCM assistance [name],[name] 07/01/2018 [phone]

## 2018-07-01 NOTE — Progress Notes (Signed)
Patient with PICC replaced (patient pulled out 1st PICC) and per Pharmacy, the TPN bag that was formulated for yesterday will be temporarily put on hold due to potassium.  TPN to be restarted 1/23 @ 1800.  Patient RN made aware.

## 2018-07-02 LAB — GLUCOSE, CAPILLARY
GLUCOSE-CAPILLARY: 158 mg/dL — AB (ref 70–99)
Glucose-Capillary: 163 mg/dL — ABNORMAL HIGH (ref 70–99)
Glucose-Capillary: 135 mg/dL — ABNORMAL HIGH (ref 70–99)
Glucose-Capillary: 171 mg/dL — ABNORMAL HIGH (ref 70–99)

## 2018-07-02 LAB — BPAM RBC
Blood Product Expiration Date: 202002162359
Blood Product Expiration Date: 202002192359
ISSUE DATE / TIME: 202001231153
ISSUE DATE / TIME: 202001231550
Unit Type and Rh: 600
Unit Type and Rh: 600

## 2018-07-02 LAB — MAGNESIUM: Magnesium: 1.8 mg/dL (ref 1.7–2.4)

## 2018-07-02 LAB — BASIC METABOLIC PANEL WITH GFR
Anion gap: 6 (ref 5–15)
BUN: 34 mg/dL — ABNORMAL HIGH (ref 8–23)
CO2: 23 mmol/L (ref 22–32)
Calcium: 8.3 mg/dL — ABNORMAL LOW (ref 8.9–10.3)
Chloride: 112 mmol/L — ABNORMAL HIGH (ref 98–111)
Creatinine, Ser: 0.73 mg/dL (ref 0.61–1.24)
GFR calc Af Amer: >60 mL/min (ref >60)
GFR calc non Af Amer: >60 mL/min (ref >60)
Glucose, Bld: 182 mg/dL — ABNORMAL HIGH (ref 70–99)
Potassium: 3.5 mmol/L (ref 3.5–5.1)
Sodium: 141 mmol/L (ref 135–145)

## 2018-07-02 LAB — CBC
HCT: 31 % — ABNORMAL LOW (ref 39.0–52.0)
Hemoglobin: 9.8 g/dL — ABNORMAL LOW (ref 13.0–17.0)
MCH: 31 pg (ref 26.0–34.0)
MCHC: 31.6 g/dL (ref 30.0–36.0)
MCV: 98.1 fL (ref 80.0–100.0)
Platelets: 231 K/uL (ref 150–400)
RBC: 3.16 MIL/uL — ABNORMAL LOW (ref 4.22–5.81)
RDW: 16.7 % — ABNORMAL HIGH (ref 11.5–15.5)
WBC: 8.5 K/uL (ref 4.0–10.5)
nRBC: 0 % (ref 0.0–0.2)

## 2018-07-02 LAB — TYPE AND SCREEN
ABO/RH(D): A NEG
Antibody Screen: NEGATIVE
Unit division: 0
Unit division: 0

## 2018-07-02 LAB — PHOSPHORUS: Phosphorus: 4 mg/dL (ref 2.5–4.6)

## 2018-07-02 MED ORDER — FENTANYL CITRATE (PF) 100 MCG/2ML IJ SOLN
50.0000 ug | Freq: Once | INTRAMUSCULAR | Status: AC
  Administered 2018-07-02: 50 ug via INTRAVENOUS
  Filled 2018-07-02: qty 2

## 2018-07-02 MED ORDER — POTASSIUM CHLORIDE 10 MEQ/100ML IV SOLN
10.0000 meq | INTRAVENOUS | Status: AC
  Administered 2018-07-02: 10 meq via INTRAVENOUS
  Filled 2018-07-02: qty 100

## 2018-07-02 MED ORDER — DEXMEDETOMIDINE HCL IN NACL 200 MCG/50ML IV SOLN
0.0000 ug/kg/h | INTRAVENOUS | Status: DC
  Administered 2018-07-02 (×2): 1.2 ug/kg/h via INTRAVENOUS
  Administered 2018-07-02: 0.4 ug/kg/h via INTRAVENOUS
  Administered 2018-07-03 – 2018-07-04 (×7): 1.2 ug/kg/h via INTRAVENOUS
  Administered 2018-07-04: 1 ug/kg/h via INTRAVENOUS
  Administered 2018-07-04: 1.2 ug/kg/h via INTRAVENOUS
  Filled 2018-07-02 (×12): qty 50

## 2018-07-02 MED ORDER — TRAVASOL 10 % IV SOLN
INTRAVENOUS | Status: AC
  Administered 2018-07-02: 17:00:00 via INTRAVENOUS
  Filled 2018-07-02: qty 920.4

## 2018-07-02 MED ORDER — FENTANYL CITRATE (PF) 100 MCG/2ML IJ SOLN
50.0000 ug | INTRAMUSCULAR | Status: DC | PRN
  Administered 2018-07-02: 50 ug via INTRAVENOUS
  Filled 2018-07-02: qty 2

## 2018-07-02 MED ORDER — FENTANYL CITRATE (PF) 100 MCG/2ML IJ SOLN
50.0000 ug | INTRAMUSCULAR | Status: AC | PRN
  Administered 2018-07-02 (×3): 50 ug via INTRAVENOUS
  Filled 2018-07-02 (×3): qty 2

## 2018-07-02 MED ORDER — FENTANYL 2500MCG IN NS 250ML (10MCG/ML) PREMIX INFUSION
25.0000 ug/h | INTRAVENOUS | Status: DC
  Administered 2018-07-02: 50 ug/h via INTRAVENOUS
  Administered 2018-07-03: 175 ug/h via INTRAVENOUS
  Administered 2018-07-04: 200 ug/h via INTRAVENOUS
  Filled 2018-07-02 (×6): qty 250

## 2018-07-02 MED ORDER — INSULIN ASPART 100 UNIT/ML ~~LOC~~ SOLN
0.0000 [IU] | Freq: Four times a day (QID) | SUBCUTANEOUS | Status: DC
  Administered 2018-07-02 (×2): 2 [IU] via SUBCUTANEOUS
  Administered 2018-07-03: 1 [IU] via SUBCUTANEOUS
  Administered 2018-07-03 – 2018-07-04 (×2): 2 [IU] via SUBCUTANEOUS
  Administered 2018-07-04 – 2018-07-05 (×4): 1 [IU] via SUBCUTANEOUS
  Administered 2018-07-05: 2 [IU] via SUBCUTANEOUS
  Administered 2018-07-05 (×2): 1 [IU] via SUBCUTANEOUS
  Administered 2018-07-06: 2 [IU] via SUBCUTANEOUS
  Administered 2018-07-07: 3 [IU] via SUBCUTANEOUS
  Administered 2018-07-07 (×2): 2 [IU] via SUBCUTANEOUS
  Administered 2018-07-07: 3 [IU] via SUBCUTANEOUS
  Administered 2018-07-08: 1 [IU] via SUBCUTANEOUS

## 2018-07-02 MED ORDER — FENTANYL BOLUS VIA INFUSION
25.0000 ug | INTRAVENOUS | Status: DC | PRN
  Administered 2018-07-02 – 2018-07-05 (×6): 25 ug via INTRAVENOUS
  Filled 2018-07-02 (×2): qty 25

## 2018-07-02 MED ORDER — LORAZEPAM 2 MG/ML IJ SOLN
2.0000 mg | Freq: Once | INTRAMUSCULAR | Status: AC
  Administered 2018-07-02: 2 mg via INTRAVENOUS
  Filled 2018-07-02: qty 1

## 2018-07-02 NOTE — Progress Notes (Signed)
Nutrition Follow-up  DOCUMENTATION CODES:   Underweight, Severe malnutrition in context of chronic illness  INTERVENTION:  - Continue TPN per Pharmacy. - Continue to monitor for ability to trial trickle feeds via NGT.   NUTRITION DIAGNOSIS:   Severe Malnutrition related to chronic illness(COPD) as evidenced by energy intake < or equal to 75% for > or equal to 1 month, severe fat depletion, severe muscle depletion, percent weight loss. 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 Presents this admission with SBO due to hernia.   No new weight since 1/22. Patient remains intubated with OGT to LIS. No output in canister, yellow liquid noted in tubing. Per review of flowsheet, 200 ml output overnight. No family/visitors present at bedside.   Patient double lumen PICC and is receiving custom TPN @ 65 ml/hr which is providing 1465 kcal (120% re-estimated kcal need), 84 grams of protein.    Patient is currently intubated on ventilator support MV: 7.2 L/min Temp (24hrs), Avg:98.2 F (36.8 C), Min:97.5 F (36.4 C), Max:98.9 F (37.2 C) BP: 170/90 and MAP: 109   Medications reviewed; sliding scale novolog. Labs reviewed; CBGs: 163 and 135 mg/dl, Cl: 112 mmol/l, BUN: 34 mg/dl, Ca: 8.3 mg/dl.  IVF; NS @ 35 ml/hr. Drip; fentanyl @ 100 mcg/hr.    Diet Order:   Diet Order            Diet NPO time specified  Diet effective now              EDUCATION NEEDS:   Education needs have been addressed  Skin:  Skin Assessment: Skin Integrity Issues: Skin Integrity Issues:: Incisions Incisions: abdominal (1/22)  Last BM:  1/20  Height:   Ht Readings from Last  1 Encounters:  06/30/18 '5\' 2"'  (1.575 m)    Weight:   Wt Readings from Last 1 Encounters:  06/30/18 36.9 kg    Ideal Body Weight:  53.6 kg  BMI:  Body mass index is 14.88 kg/m.  Estimated Nutritional Needs:   Kcal:  1216 kcal  Protein:  74-92 grams (2-2.5 grams/kg)  Fluid:  1.5L/day     Jarome Matin, MS, RD, LDN, CNSC Inpatient Clinical Dietitian Pager # [phone] After hours/weekend pager # [phone]

## 2018-07-02 NOTE — Progress Notes (Signed)
NAME:  [name], MRN:  [id number], DOB:  [birth year], LOS: 25 ADMISSION DATE:  06/13/2018, CONSULTATION DATE:  06/18/18 REFERRING MD:  Ernst Spell CHIEF COMPLAINT:  Hypoxia, agitation   Brief History   68 yo M quit smoking sev weeks pta with COPD GOLD III criteria 2016  s/p inguinal hernia repair 1/10, after presenting 1/5 with abdominal pain, bilateral inguinal hernias with small bowel obstruction. Underwent hernia repair 1/10. Post operatively patient is agitated and had SpO2 high 80s to low 90s; PCCM asked to consult for hypoxia. Status post exploratory laparotomy on 06/30/2018, currently on ventilator  Past Medical History  COPD - PFT's  11/17/14 FEV1 1/25 (49 % ) ratio 49  with DLCO  35 % corrects to 43 % for alv volume and classic copd curvature to f/v  , HTN, PVD, NSCLC s/p SBRT to left lung  EtOH abuse, Recurrent inguinal hernia  Significant Hospital Events   1/5 Admission 1/10 L inguinal hernia repair. Transfer to SDU for hypoxia post-operative 1/11 mild sedation from ativan, on 5L  1/15 Confusion overnight > started on steroids 1/14, started on TNA 1/22: Ongoing abdominal ileus versus obstruction.  Surgical service is planning bringing back for exploration.  Pulmonary asked to see in regards to abnormal CT findings specifically right middle lobe collapse/atelectasis and persistent bilateral effusions and basilar atelectasis 1/23: Awake, interactive, will be receiving transfusion today  Consults:  Surgery PCCM  Procedures:  1/10 L inguinal hernia repair 1/22/exploratory laparotomy and adhesiolysis Significant Diagnostic Tests:  1/5 CT a/p >> bilatera hiatal hernia, distal small bowel obstruction. Transition point located within L inguinal hernia. R hiatal hernia containing ascites  1/10 CXR >> RLL opacity  Micro Data:  1/10 MRSA >> neg  Antimicrobials:  Zosyn 1/22 (for possible aspiration).   Interim history/subjective:  No distress. Post exploratory laparotomy On  vent, awake, not in pain  Objective   Blood pressure (Abnormal) 170/88, pulse (Abnormal) 118, temperature 98.3 F (36.8 C), temperature source Axillary, resp. rate 16, height 5\' 2"  (1.575 m), weight 36.9 kg, SpO2 98 %. on 2.5 lpm     Vent Mode: PRVC FiO2 (%):  [30 %] 30 % Set Rate:  [16 bmp] 16 bmp Vt Set:  [450 mL] 450 mL PEEP:  [8 cmH20] 8 cmH20 Pressure Support:  [5 cmH20] 5 cmH20 Plateau Pressure:  [12 cmH20-20 cmH20] 12 cmH20   Intake/Output Summary (Last 24 hours) at 07/02/2018 1003 Last data filed at 07/02/2018 0900 Gross per 24 hour  Intake 3233.82 ml  Output 1405 ml  Net 1828.82 ml   Filed Weights   06/22/18 0845 06/25/18 1418 06/30/18 1300  Weight: 38 kg 34.4 kg 36.9 kg   Examination: General: This is a frail 68 year old white male he is currently resting in bed.  He is interactive, wanting ice denies pain HEENT temporal wasting is appreciated, no JVD mucous membranes moist orally intubated. Pulmonary: Some scattered rhonchi, equal chest rise, triggers apnea alarm when placed on pressure support Cardiac: Regular rate and rhythm without murmur rub or gallop Abdomen: Soft, denies tenderness.  Mid abdominal dressing intact, hypoactive bowel sounds Extremities: Warm, dry, scattered areas of ecchymosis, some dependent edema Neuro: Interactive, moves all extremities, no focal deficits. GU: Clear yellow  Resolved Hospital Problem list   SBO -post exploratory laparotomy  Assessment & Plan:   Chronic versus acute on chronic hypoxic respiratory failure.  This is multifactorial: Persistent right middle lobe atelectasis/collapse with air bronchograms, bibasilar atelectasis, underlying COPD, and bilateral right greater than  left effusions -Return from operating room on ventilatory support on 1/22 -Failed spontaneous breathing trial secondary to triggering apnea alarm, also low volumes Plan Now day #3 of Zosyn, Will plan on 7-day course Continue PAD protocol, changing rascal  to -1 We will see if changing to Precedex with PRN fentanyl helps [place] be more successful with weaning efforts Daily assessment for spontaneous breathing trial Continue bronchodilators VAP bundle Cont HT saline nebs  Acute metabolic encephalopathy, superimposed on 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  H/o lung cancer Plan Repeat imaging 3 to 4 weeks after discharge from a cancer standpoint is not a candidate for further diagnostics given poor functional status nutritional status and acute illness   H/o PAD s/p stent to Right external iliac on 10/22 Plan Resume Plavix when okay by surgical services  Malnutrition Plan TPA via PICC line  Mild anemia without evidence of bleeding Plan Trend CBC  Best practice    Diet: We will start TPN today  Activity: Bedrest, advance as tolerated DVT: SCDs PUD: H2B Glycemic control: Currently on sliding scale insulin  Disposition: He is now postop day #2.  Seems to be improving but he is very weak.  He triggers apnea alarm during attempts at ventilation, hopefully transitioning to Precedex will help this.  I am hopeful we will get him off the ventilator but he is very debilitated and malnourished, he may require prolonged ventilation  Erick Colace ACNP-BC [place] Pager # [phone] OR # [phone] if no answer

## 2018-07-02 NOTE — Progress Notes (Signed)
2 Days Post-Op   Subjective/Chief Complaint: Very awake and alert today. On vent   Objective: Vital signs in last 24 hours: Temp:  [97.5 F (36.4 C)-98.9 F (37.2 C)] 98 F (36.7 C) (01/24 0400) Pulse Rate:  [101-122] 103 (01/23 2100) Resp:  [13-18] 16 (01/23 2100) BP: (127-160)/(71-88) 137/71 (01/23 2100) SpO2:  [81 %-100 %] 97 % (01/23 2359) FiO2 (%):  [30 %-40 %] 30 % (01/24 0647) Last BM Date: 06/28/18  Intake/Output from previous day: 01/23 0701 - 01/24 0700 In: 3570.9 [I.V.:2555.5; Blood:630; NG/GT:250; IV Piggyback:135.4] Out: 1505 [Urine:1105; Emesis/NG output:400] Intake/Output this shift: No intake/output data recorded.  General appearance: alert and cooperative Resp: clear to auscultation bilaterally and on vent Cardio: regular rate and rhythm GI: distended and appropriately tender. quiet. incision ok  Lab Results:  Recent Labs    07/01/18 0751 07/01/18 2124 07/02/18 0442  WBC 7.5  --  8.5  HGB 7.4* 9.6* 9.8*  HCT 24.0* 30.1* 31.0*  PLT 316  --  231   BMET Recent Labs    07/01/18 0751 07/02/18 0442  NA 136 141  K 5.0 3.5  CL 110 112*  CO2 20* 23  GLUCOSE 133* 182*  BUN 42* 34*  CREATININE 1.03 0.73  CALCIUM 7.9* 8.3*   PT/INR No results for input(s): LABPROT, INR in the last 72 hours. ABG Recent Labs    06/30/18 1420  PHART 7.075*  HCO3 19.4*    Studies/Results: Portable Chest X-ray  Result Date: 06/30/2018 CLINICAL DATA:  Status post ET tube placement EXAM: PORTABLE CHEST 1 VIEW COMPARISON:  06/20/2018 FINDINGS: Endotracheal tube tip is situated above the carina. There is a enteric 2 with side port below the level of the GE junction. Lungs appear hyperinflated. Interval improvement in bilateral pleural effusions. Persistent airspace opacity overlying the medial right lower lobe. IMPRESSION: 1. ETT tip is above the carina. 2. Persistent rounded opacity overlying the medial right lower lobe. 3. Improvement in bilateral pleural effusions.  Electronically Signed   By: Kerby Moors M.D.   On: 06/30/2018 14:18   [place] Ekg Site Rite  Result Date: 06/30/2018 If Site Rite image not attached, placement could not be confirmed due to current cardiac rhythm.   Anti-infectives: Anti-infectives (From admission, onward)   Start     Dose/Rate Route Frequency Ordered Stop   06/30/18 1000  piperacillin-tazobactam (ZOSYN) IVPB 3.375 g     3.375 g 12.5 mL/hr over 240 Minutes Intravenous Every 8 hours 06/30/18 0920     06/22/18 1300  cefTRIAXone (ROCEPHIN) 1 g in sodium chloride 0.9 % 100 mL IVPB     1 g 200 mL/hr over 30 Minutes Intravenous Every 24 hours 06/22/18 1202 06/26/18 1334   06/22/18 1300  azithromycin (ZITHROMAX) 500 mg in sodium chloride 0.9 % 250 mL IVPB     500 mg 250 mL/hr over 60 Minutes Intravenous Every 24 hours 06/22/18 1202 06/26/18 1513   06/18/18 0600  ceFAZolin (ANCEF) IVPB 2g/100 mL premix  Status:  Discontinued     2 g 200 mL/hr over 30 Minutes Intravenous On call to O.R. 06/18/18 0531 06/18/18 1211      Assessment/Plan: s/p Procedure(s): EXPLORATORY LAPAROTOMY LYSISIS OF ADHESIONS (N/A) continue ng and bowel rest for sbo  Continue IV zosyn for PNA Wean vent as tolerated. Continue tpn for nutrition support POD 2 Critical care per CCM  LOS: 18 days    [name] 07/02/2018

## 2018-07-02 NOTE — Progress Notes (Signed)
RT / RN collectively decided to not perform CPT at this time. PT has been extremely restless and somewhat difficult to manage. CPT is ordered Q4 and will be attempted again later this shift. Per third shift RT CPT was completed just prior to 0700 today.

## 2018-07-02 NOTE — Progress Notes (Signed)
PHARMACY - ADULT TOTAL PARENTERAL NUTRITION CONSULT NOTE   Pharmacy Consult for TPN Indication: SBO  Patient Measurements: Height: 5\' 2"  (157.5 cm) Weight: 81 lb 5.6 oz (36.9 kg) IBW/kg (Calculated) : 54.6 TPN AdjBW (KG): 36.9 Body mass index is 14.88 kg/m. 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 Presents this admission with SBO due to hernia, now s/p open inguinal hernia repair but CT still shows persistent SBO separate from hernia repair site - likely d/t edema/congestion from prior incarceration.  Central access:  PICC placed 1/14 - was removed 1/21 overnight by pt. PICC placed again on 1/23 TPN start date: 1/14   Significant events:  1/15: made NPO, chg MV, lytes to IV or in TPN 1/17: slight return of bowel function, advancing to clears 1/19 100% 2 meals , 75 % 1 meal and 0% 2 meals recorded. 2 boost/resource breeze supplements charted, KUB ordered by CCS 1/20: multiple BMs reported, still with poor PO intake.  GF reports poor intake at baseline 1/21: PO intake increasing but still minimal and not meeting requirements, great difficulty tolerating PO diet.  Pharmacy discussed with CCS who will continue TPN for now. 1/22: patient with confusion overnight, pulled NG and PICC line out.  Returned to OR for exp lap, LOA, and repair of 2 additional closed loop SBO sites.  TPN off overnight 1/21-1/22. 1/23: PICC line replaced - continue to hold TPN until new bag at 1800 d/t borderline K. Propofol weaned off (1/22-1/23).   Insulin Requirements: 6 units SSI in past 24 hours  Current Nutrition: TPN  IVF: NS @ 35 mL/hr per surgery  Today, 07/02/2018:  Glucose (CBG goal 100-150) - range 82-177 with minimal SSI (note low  TBW).  Electrolytes:  WNL:  Na (up to 141), K (3.5, goal >4), Corr Ca (9.4), Mg (1.8, goal >2), Phos (4)  Cl (112) elevated  Renal - SCr increased on 1/23, but improved to 0.73 today.  BUN elevated but improved.  LFTs -  AST/ALT improved (1/23)  TGs - WNL 1/22  Prealbumin - 11.3 (1/14), improved to 22.1 (1/20)  Bowel function:  RN reports no BS on 1/24   NUTRITIONAL GOALS                                                                                             RD recs: (updated 1/23) Kcal:  1128 kcal Protein:  74-92 grams Fluid:  >/= 1.5 L/day  Custom TPN at 65 ml/hr provides: Protein 59 g/l, 92 g/day Dextrose 8.5%, 133 g/day Lipids 20 g/l, 31.2 g/day Total 1131 Kcal/day  PLAN                                                                                                                         Now:  KCl 10 mEq IV x1 run  At 1800 today:  Change formula in Custom TPN at goal rate 65 ml/hr.  Provides 100% of protein and Kcal goals  TPN to contain standard multivitamins and trace element daily.  Electrolytes:  Reduce Na to 80 mEq/L, increase K to 25 mEq/L, cont Ca 5 mEq/L, increase to Mag 15 mEq/L, increase to Phos 5 mmol/L  Max CO2, minimal Cl   Famotidine 20 mg in TPN  Continue IVF, NS at 35 ml/hr per surgery.  Continue sensitive SSI, decrease to q6h.   TPN lab panels on Mondays & Thursdays.  BMET, Mag, Phos on Sat/Sun.  F/u ability to advance of diet once pt is having BS and flatus, then wean TPN when tolerating 60% of enteral diet.   Gretta Arab PharmD, BCPS Pager [phone] 07/02/2018 8:39 AM

## 2018-07-03 ENCOUNTER — Inpatient Hospital Stay (HOSPITAL_COMMUNITY): Payer: Medicare Other

## 2018-07-03 LAB — BLOOD GAS, ARTERIAL
Acid-Base Excess: 3.5 mmol/L — ABNORMAL HIGH (ref 0.0–2.0)
Bicarbonate: 27.6 mmol/L (ref 20.0–28.0)
Drawn by: 235321
FIO2: 28
O2 Saturation: 92.9 %
PEEP: 5 cmH2O
Patient temperature: 98.6
RATE: 16 resp/min
pCO2 arterial: 42.1 mmHg (ref 32.0–48.0)
pH, Arterial: 7.433 (ref 7.350–7.450)
pO2, Arterial: 63.4 mmHg — ABNORMAL LOW (ref 83.0–108.0)

## 2018-07-03 LAB — GLUCOSE, CAPILLARY
Glucose-Capillary: 102 mg/dL — ABNORMAL HIGH (ref 70–99)
Glucose-Capillary: 133 mg/dL — ABNORMAL HIGH (ref 70–99)
Glucose-Capillary: 158 mg/dL — ABNORMAL HIGH (ref 70–99)
Glucose-Capillary: 85 mg/dL (ref 70–99)
Glucose-Capillary: 97 mg/dL (ref 70–99)

## 2018-07-03 LAB — BASIC METABOLIC PANEL
Anion gap: 6 (ref 5–15)
BUN: 32 mg/dL — ABNORMAL HIGH (ref 8–23)
CO2: 27 mmol/L (ref 22–32)
Calcium: 8.5 mg/dL — ABNORMAL LOW (ref 8.9–10.3)
Chloride: 109 mmol/L (ref 98–111)
Creatinine, Ser: 0.68 mg/dL (ref 0.61–1.24)
GFR calc Af Amer: >60 mL/min (ref >60)
GFR calc non Af Amer: >60 mL/min (ref >60)
Glucose, Bld: 122 mg/dL — ABNORMAL HIGH (ref 70–99)
Potassium: 2.9 mmol/L — ABNORMAL LOW (ref 3.5–5.1)
Sodium: 142 mmol/L (ref 135–145)

## 2018-07-03 LAB — PHOSPHORUS: Phosphorus: 3.1 mg/dL (ref 2.5–4.6)

## 2018-07-03 LAB — TRIGLYCERIDES: Triglycerides: 35 mg/dL (ref <150)

## 2018-07-03 LAB — MAGNESIUM: Magnesium: 1.9 mg/dL (ref 1.7–2.4)

## 2018-07-03 MED ORDER — ENALAPRILAT 1.25 MG/ML IV SOLN
1.2500 mg | Freq: Four times a day (QID) | INTRAVENOUS | Status: DC
  Administered 2018-07-03 – 2018-07-06 (×12): 1.25 mg via INTRAVENOUS
  Filled 2018-07-03 (×12): qty 1

## 2018-07-03 MED ORDER — HYDRALAZINE HCL 20 MG/ML IJ SOLN
10.0000 mg | INTRAMUSCULAR | Status: DC | PRN
  Administered 2018-07-03: 10 mg via INTRAVENOUS
  Filled 2018-07-03: qty 1

## 2018-07-03 MED ORDER — POTASSIUM CHLORIDE 10 MEQ/100ML IV SOLN
10.0000 meq | INTRAVENOUS | Status: AC
  Administered 2018-07-03 (×3): 10 meq via INTRAVENOUS
  Filled 2018-07-03: qty 100

## 2018-07-03 MED ORDER — TRAVASOL 10 % IV SOLN
INTRAVENOUS | Status: AC
  Administered 2018-07-03: 18:00:00 via INTRAVENOUS
  Filled 2018-07-03: qty 920.4

## 2018-07-03 MED ORDER — METOPROLOL TARTRATE 5 MG/5ML IV SOLN
2.5000 mg | Freq: Four times a day (QID) | INTRAVENOUS | Status: DC
  Administered 2018-07-03 – 2018-07-06 (×12): 2.5 mg via INTRAVENOUS
  Filled 2018-07-03 (×12): qty 5

## 2018-07-03 MED ORDER — ACETAMINOPHEN 650 MG RE SUPP
650.0000 mg | RECTAL | Status: DC | PRN
  Filled 2018-07-03: qty 1

## 2018-07-03 NOTE — Progress Notes (Signed)
3 Days Post-Op   Subjective/Chief Complaint: On vent Sleeping this morning   Objective: Vital signs in last 24 hours: Temp:  [98 F (36.7 C)-100.9 F (38.3 C)] 100.9 F (38.3 C) (01/25 0734) Pulse Rate:  [86-118] 90 (01/25 0800) Resp:  [16-24] 16 (01/25 0800) BP: (148-189)/(82-91) 188/91 (01/25 0800) SpO2:  [95 %-100 %] 97 % (01/25 0800) FiO2 (%):  [28 %-30 %] 28 % (01/25 0404) Last BM Date: 06/28/18  Intake/Output from previous day: 01/24 0701 - 01/25 0700 In: 2475.6 [I.V.:2050.2; NG/GT:180; IV Piggyback:245.5] Out: 2640 [Urine:2390; Emesis/NG output:250] Intake/Output this shift: No intake/output data recorded.  Exam: On vent Abdomen soft, non-distended, dressing dry, NG output still bilous  Lab Results:  Recent Labs    07/01/18 0751 07/01/18 2124 07/02/18 0442  WBC 7.5  --  8.5  HGB 7.4* 9.6* 9.8*  HCT 24.0* 30.1* 31.0*  PLT 316  --  231   BMET Recent Labs    07/02/18 0442 07/03/18 0500  NA 141 142  K 3.5 2.9*  CL 112* 109  CO2 23 27  GLUCOSE 182* 122*  BUN 34* 32*  CREATININE 0.73 0.68  CALCIUM 8.3* 8.5*   PT/INR No results for input(s): LABPROT, INR in the last 72 hours. ABG Recent Labs    06/30/18 1420 07/03/18 0425  PHART 7.075* 7.433  HCO3 19.4* 27.6    Studies/Results: Dg Chest Port 1 View  Result Date: 07/03/2018 CLINICAL DATA:  Pneumonia. Endotracheal tube. EXAM: PORTABLE CHEST 1 VIEW COMPARISON:  Chest x-ray dated 06/30/2018. FINDINGS: Endotracheal tube is stable in position with tip approximately 4 cm above the level of the carina. LEFT-sided PICC line is well positioned with tip at the level of the lower SVC/cavoatrial junction. Enteric tube appears adequately positioned in the stomach. Heart size and mediastinal contours are within normal limits. Atherosclerotic changes noted at the aortic arch. Persistent RIGHT middle lobe opacity. Mild scarring/atelectasis within the bilateral mid lung regions. Lungs otherwise clear. No pleural  effusion or pneumothorax seen. IMPRESSION: 1. Endotracheal tube is well positioned with tip approximately 4 cm above the level of the carina. 2. Otherwise stable chest x-ray. Persistent RIGHT middle lobe opacity corresponds to a chronic appearing RIGHT middle lobe atelectasis demonstrated on chest CT of 06/21/2018. No evidence of active pneumonia or pulmonary edema on today's exam. Electronically Signed   By: Franki Cabot M.D.   On: 07/03/2018 05:30    Anti-infectives: Anti-infectives (From admission, onward)   Start     Dose/Rate Route Frequency Ordered Stop   06/30/18 1000  piperacillin-tazobactam (ZOSYN) IVPB 3.375 g     3.375 g 12.5 mL/hr over 240 Minutes Intravenous Every 8 hours 06/30/18 0920     06/22/18 1300  cefTRIAXone (ROCEPHIN) 1 g in sodium chloride 0.9 % 100 mL IVPB     1 g 200 mL/hr over 30 Minutes Intravenous Every 24 hours 06/22/18 1202 06/26/18 1334   06/22/18 1300  azithromycin (ZITHROMAX) 500 mg in sodium chloride 0.9 % 250 mL IVPB     500 mg 250 mL/hr over 60 Minutes Intravenous Every 24 hours 06/22/18 1202 06/26/18 1513   06/18/18 0600  ceFAZolin (ANCEF) IVPB 2g/100 mL premix  Status:  Discontinued     2 g 200 mL/hr over 30 Minutes Intravenous On call to O.R. 06/18/18 0531 06/18/18 1211      Assessment/Plan: s/p Procedure(s): EXPLORATORY LAPAROTOMY LYSISIS OF ADHESIONS (N/A)  Continuing NG until extubated Post op ileus Continue TPN Vent per CCM   LOS: 19 days  Coralie Keens 07/03/2018

## 2018-07-03 NOTE — Progress Notes (Signed)
NAME:  [name], MRN:  [id number], DOB:  1951/02/11, LOS: 19 ADMISSION DATE:  06/13/2018, CONSULTATION DATE:  06/18/18 REFERRING MD:  Ernst Spell CHIEF COMPLAINT:  Hypoxia, agitation   Brief History   68 yo M quit smoking sev weeks pta with COPD GOLD III criteria 2016  s/p inguinal hernia repair 1/10, after presenting 1/5 with abdominal pain, bilateral inguinal hernias with small bowel obstruction. Underwent hernia repair 1/10. Post operatively patient is agitated and had SpO2 high 80s to low 90s; PCCM asked to consult for hypoxia. Status post exploratory laparotomy on 06/30/2018, currently on ventilator 07/03/2018-uneventful night, blood pressure trending higher  Past Medical History  COPD - PFT's  11/17/14 FEV1 1/25 (49 % ) ratio 49  with DLCO  35 % corrects to 43 % for alv volume and classic copd curvature to f/v  , HTN, PVD, NSCLC s/p SBRT to left lung  EtOH abuse, Recurrent inguinal hernia  Significant Hospital Events   1/5 Admission 1/10 L inguinal hernia repair. Transfer to SDU for hypoxia post-operative 1/11 mild sedation from ativan, on 5L  1/15 Confusion overnight > started on steroids 1/14, started on TNA 1/22: Ongoing abdominal ileus versus obstruction.  Surgical service is planning bringing back for exploration.  Pulmonary asked to see in regards to abnormal CT findings specifically right middle lobe collapse/atelectasis and persistent bilateral effusions and basilar atelectasis 1/23: Awake, interactive, will be receiving transfusion today 1/25: Blood pressure trending higher, low-grade temps  Consults:  Surgery PCCM  Procedures:  1/10 L inguinal hernia repair 1/22/exploratory laparotomy and adhesiolysis  Significant Diagnostic Tests:  1/5 CT a/p >> bilatera hiatal hernia, distal small bowel obstruction. Transition point located within L inguinal hernia. R hiatal hernia containing ascites  1/10 CXR >> RLL opacity 1/25 chest x-ray-reviewed by myself, chronic right lower  lobe infiltrate, tube appropriately positioned Micro Data:  1/10 MRSA >> neg  Antimicrobials:  Zosyn 1/22 (for possible aspiration).   Interim history/subjective:  No distress. Post exploratory laparotomy On vent, sedated Low-grade temp, elevated BP  Objective   Blood pressure (!) 187/91, pulse (!) 102, temperature 98.9 F (37.2 C), temperature source Axillary, resp. rate 16, height 5\' 2"  (1.575 m), weight 36.9 kg, SpO2 97 %. on 2.5 lpm     Vent Mode: PRVC FiO2 (%):  [28 %-30 %] 28 % Set Rate:  [16 bmp] 16 bmp Vt Set:  [450 mL] 450 mL PEEP:  [5 cmH20] 5 cmH20 Plateau Pressure:  [14 cmH20-19 cmH20] 14 cmH20   Intake/Output Summary (Last 24 hours) at 07/03/2018 1105 Last data filed at 07/03/2018 1026 Gross per 24 hour  Intake 2548.08 ml  Output 3190 ml  Net -641.92 ml   Filed Weights   06/22/18 0845 06/25/18 1418 06/30/18 1300  Weight: 38 kg 34.4 kg 36.9 kg   Examination: General: This is a frail 68 year old white male he is currently resting in bed.  Sedated HEENT temporal wasting is appreciated, external mucosa  Pulmonary: Some scattered rhonchi, Cardiac: Regular rate and rhythm without murmur rub or gallop Abdomen: Soft, denies tenderness.  Mid abdominal dressing intact, hypoactive bowel sounds Extremities: Warm, dry, scattered areas of ecchymosis, some dependent edema Neuro: Moving all extremities, no focality GU: Clear yellow  Resolved Hospital Problem list   SBO -post exploratory laparotomy  Assessment & Plan:   Chronic versus acute on chronic hypoxic respiratory failure.  This is multifactorial: Persistent right middle lobe atelectasis/collapse with air bronchograms, bibasilar atelectasis, underlying COPD, and bilateral right greater than left effusions -  Return from operating room on ventilatory support on 1/22 -Failed spontaneous breathing trial secondary to triggering apnea alarm, also low volumes Plan Now day #4 of Zosyn, Will plan on 7-day course Continue  PAD protocol,  rass scale  -1 Daily assessment for spontaneous breathing trial Continue bronchodilators VAP bundle Cont HT saline nebs  Ileus -Postoperative state -Hypoactive bowel sounds still with bilious drainage -We will continue to monitor  Uncontrolled hypertension -We will resume Lopressor, give IV -Start on enalapril IV  Acute metabolic encephalopathy, superimposed on 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  H/o lung cancer Plan Repeat imaging 3 to 4 weeks after discharge from a cancer standpoint is not a candidate for further diagnostics given poor functional status nutritional status and acute illness   H/o PAD s/p stent to Right external iliac on 10/22 Plan Resume Plavix when okay by surgical services  Malnutrition Plan TPA via PICC line  Mild anemia without evidence of bleeding Plan Trend CBC  Best practice    Diet: We will start TPN today  Activity: Bedrest, advance as tolerated DVT: SCDs PUD: H2B Glycemic control: Currently on sliding scale insulin  Disposition: He is now postop day #3.   Very weak Apneic episodes on vent support  He triggers apnea alarm during attempts at ventilation, He is very debilitated and likelihood of weaning in the short-term is poor  Will be appropriate to wait until his abdomen settles under bit more before attempting to wean and extubate Very poor reserves at baseline   Critical care time spent evaluating, reviewing records, formulating plan of care 30 minutes

## 2018-07-03 NOTE — Progress Notes (Signed)
Girlfriend of patient was able to give daughter's phone numbers. Patient does not have a designated POA.   [place] [phone]  Threasa Beards [phone]

## 2018-07-03 NOTE — Progress Notes (Signed)
PHARMACY - ADULT TOTAL PARENTERAL NUTRITION CONSULT NOTE   Pharmacy Consult for TPN Indication: SBO  Patient Measurements: Height: 5\' 2"  (157.5 cm) Weight: 81 lb 5.6 oz (36.9 kg) IBW/kg (Calculated) : 54.6 TPN AdjBW (KG): 36.9 Body mass index is 14.88 kg/m. 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 Presents this admission with SBO due to hernia, now s/p open inguinal hernia repair but CT still shows persistent SBO separate from hernia repair site - likely d/t edema/congestion from prior incarceration.  Central access:  PICC placed 1/14 - was removed 1/21 overnight by pt. PICC placed again on 1/23 TPN start date: 1/14   Significant events:  1/15: made NPO, chg MV, lytes to IV or in TPN 1/17: slight return of bowel function, advancing to clears 1/19 100% 2 meals , 75 % 1 meal and 0% 2 meals recorded. 2 boost/resource breeze supplements charted, KUB ordered by CCS 1/20: multiple BMs reported, still with poor PO intake.  GF reports poor intake at baseline 1/21: PO intake increasing but still minimal and not meeting requirements, great difficulty tolerating PO diet.  Pharmacy discussed with CCS who will continue TPN for now. 1/22: patient with confusion overnight, pulled NG and PICC line out.  Returned to OR for exp lap, LOA, and repair of 2 additional closed loop SBO sites.  TPN off overnight 1/21-1/22. 1/23: PICC line replaced - continue to hold TPN until new bag at 1800 d/t borderline K. Propofol weaned off (1/22-1/23).   Insulin Requirements: 6 units SSI in past 24 hours  Current Nutrition: TPN  IVF: NS @ 35 mL/hr per surgery  Today, 07/03/2018:  Glucose (CBG goal 100-150) - range 97-171 with minimal SSI (note low  TBW).  Electrolytes:  Na (up to 142), K (down to 2.9, goal >4), Corr Ca (9.6), Mg (1.9, goal >2), Phos (3.1), Cl (109)  Renal - SCr and BUN improving  LFTs -  AST/ALT improved  TGs - WNL (1/22)  Prealbumin - 11.3 (1/14), improved to 22.1 (1/20)  Bowel function:  MD reports ongoing ileus, bilious NG output.   NUTRITIONAL GOALS                                                                                             RD recs: (updated 1/23) Kcal:  1128 kcal Protein:  74-92 grams Fluid:  >/= 1.5 L/day  Custom TPN at 65 ml/hr provides: Protein 59 g/l, 92 g/day  Dextrose 8.5%, 133 g/day Lipids 20 g/l, 31.2 g/day Total 1131 Kcal/day  PLAN                                                                                                                         Now:  KCl 10 mEq IV x3 run  At 1800 today:  Continue Custom TPN at goal rate 65 ml/hr.  Provides 100% of protein and Kcal goals  TPN to contain standard multivitamins and trace element daily.  Electrolytes:  Reduce Na to 70 mEq/L, increase K to 50 mEq/L, cont Ca 5 mEq/L, increase to Mag 20 mEq/L, cont Phos 5 mmol/L.  Cl 1: Ac 2 ratio.  Famotidine 20 mg in TPN  Continue IVF, NS at 35 ml/hr per surgery.  Continue sensitive SSI, CBGs q6h.   TPN lab panels on Mondays & Thursdays.  BMET, Mag, Phos on Sat/Sun.  F/u ability to advance of diet once pt is having BS and flatus, then wean TPN when tolerating 60% of enteral diet.   Gretta Arab PharmD, BCPS Pager [phone] 07/03/2018 8:43 AM

## 2018-07-04 LAB — BASIC METABOLIC PANEL
Anion gap: 6 (ref 5–15)
BUN: 34 mg/dL — ABNORMAL HIGH (ref 8–23)
CALCIUM: 8.2 mg/dL — AB (ref 8.9–10.3)
CO2: 30 mmol/L (ref 22–32)
Chloride: 102 mmol/L (ref 98–111)
Creatinine, Ser: 0.67 mg/dL (ref 0.61–1.24)
GFR calc Af Amer: >60 mL/min (ref >60)
GFR calc non Af Amer: >60 mL/min (ref >60)
Glucose, Bld: 117 mg/dL — ABNORMAL HIGH (ref 70–99)
Potassium: 3.2 mmol/L — ABNORMAL LOW (ref 3.5–5.1)
Sodium: 138 mmol/L (ref 135–145)

## 2018-07-04 LAB — GLUCOSE, CAPILLARY
Glucose-Capillary: 123 mg/dL — ABNORMAL HIGH (ref 70–99)
Glucose-Capillary: 126 mg/dL — ABNORMAL HIGH (ref 70–99)
Glucose-Capillary: 141 mg/dL — ABNORMAL HIGH (ref 70–99)
Glucose-Capillary: 143 mg/dL — ABNORMAL HIGH (ref 70–99)
Glucose-Capillary: 153 mg/dL — ABNORMAL HIGH (ref 70–99)

## 2018-07-04 LAB — PHOSPHORUS: Phosphorus: 3 mg/dL (ref 2.5–4.6)

## 2018-07-04 LAB — MAGNESIUM: Magnesium: 2.1 mg/dL (ref 1.7–2.4)

## 2018-07-04 MED ORDER — HYDRALAZINE HCL 20 MG/ML IJ SOLN
20.0000 mg | INTRAMUSCULAR | Status: DC | PRN
  Administered 2018-07-04 – 2018-07-05 (×3): 20 mg via INTRAVENOUS
  Filled 2018-07-04 (×3): qty 1

## 2018-07-04 MED ORDER — TRAVASOL 10 % IV SOLN
INTRAVENOUS | Status: AC
  Administered 2018-07-04: 18:00:00 via INTRAVENOUS
  Filled 2018-07-04: qty 920.4

## 2018-07-04 MED ORDER — POTASSIUM CHLORIDE 10 MEQ/50ML IV SOLN
10.0000 meq | INTRAVENOUS | Status: DC | PRN
  Filled 2018-07-04: qty 50

## 2018-07-04 MED ORDER — POTASSIUM CHLORIDE 10 MEQ/100ML IV SOLN
10.0000 meq | INTRAVENOUS | Status: AC
  Administered 2018-07-04 (×3): 10 meq via INTRAVENOUS
  Filled 2018-07-04: qty 100

## 2018-07-04 NOTE — Progress Notes (Signed)
RN responded to patients ventilator alarming RN found patient had ET tube in gloved hands upon entering the room.  Patients airway suctions and oxygen applied.  MD paged, updated on patients change in condition.  Pt. Currently on 6L [place] sustaining the 90's.  RN will continue to monitor.

## 2018-07-04 NOTE — Progress Notes (Signed)
Fentanyl 259ml WIS per protocol with Maretta Bees RN as witness.

## 2018-07-04 NOTE — Progress Notes (Addendum)
NAME:  [name], MRN:  [id number], DOB:  1950-08-29, LOS: 20 ADMISSION DATE:  06/13/2018, CONSULTATION DATE:  06/18/18 REFERRING MD:  Ernst Spell CHIEF COMPLAINT:  Hypoxia, agitation   Brief History   68 yo M quit smoking sev weeks pta with COPD GOLD III criteria 2016  s/p inguinal hernia repair 1/10, after presenting 1/5 with abdominal pain, bilateral inguinal hernias with small bowel obstruction. Underwent hernia repair 1/10. Post operatively patient is agitated and had SpO2 high 80s to low 90s; PCCM asked to consult for hypoxia. Status post exploratory laparotomy on 06/30/2018, currently on ventilator 07/03/2018-uneventful night, blood pressure trending higher 1/26 awake and interactive, tolerating weaning today on pressure support  Past Medical History  COPD - PFT's  11/17/14 FEV1 1/25 (49 % ) ratio 49  with DLCO  35 % corrects to 43 % for alv volume and classic copd curvature to f/v  , HTN, PVD, NSCLC s/p SBRT to left lung  EtOH abuse, Recurrent inguinal hernia  Significant Hospital Events   1/5 Admission 1/10 L inguinal hernia repair. Transfer to SDU for hypoxia post-operative 1/11 mild sedation from ativan, on 5L  1/15 Confusion overnight > started on steroids 1/14, started on TNA 1/22: Ongoing abdominal ileus versus obstruction.  Surgical service is planning bringing back for exploration.  Pulmonary asked to see in regards to abnormal CT findings specifically right middle lobe collapse/atelectasis and persistent bilateral effusions and basilar atelectasis 1/23: Awake, interactive, will be receiving transfusion today 1/25: Blood pressure trending higher, low-grade temps 1/26-weaning sedation, tolerating pressure support  Consults:  Surgery PCCM  Procedures:  1/10 L inguinal hernia repair 1/22/exploratory laparotomy and adhesiolysis  Significant Diagnostic Tests:  1/5 CT a/p >> bilatera hiatal hernia, distal small bowel obstruction. Transition point located within L inguinal  hernia. R hiatal hernia containing ascites  1/10 CXR >> RLL opacity 1/25 chest x-ray-reviewed by myself, chronic right lower lobe infiltrate, tube appropriately positioned  Micro Data:  1/10 MRSA >> neg  Antimicrobials:  Zosyn 1/22 (for possible aspiration).   Interim history/subjective:  No distress. Post exploratory laparotomy On vent, sedated Tolerating weaning better today  Objective   Blood pressure (!) 164/87, pulse 97, temperature 98.7 F (37.1 C), temperature source Axillary, resp. rate 12, height 5\' 2"  (1.575 m), weight 36.9 kg, SpO2 93 %. on 2.5 lpm     Vent Mode: PSV;CPAP FiO2 (%):  [28 %] 28 % Set Rate:  [16 bmp] 16 bmp Vt Set:  [450 mL] 450 mL PEEP:  [5 cmH20] 5 cmH20 Pressure Support:  [10 cmH20] 10 cmH20 Plateau Pressure:  [16 cmH20-18 cmH20] 18 cmH20   Intake/Output Summary (Last 24 hours) at 07/04/2018 1123 Last data filed at 07/04/2018 1122 Gross per 24 hour  Intake 2758.19 ml  Output 3550 ml  Net -791.81 ml   Filed Weights   06/22/18 0845 06/25/18 1418 06/30/18 1300  Weight: 38 kg 34.4 kg 36.9 kg   Examination: General: This is a frail 68 year old white male he is currently resting in bed.  Sedated HEENT temporal wasting is appreciated, dry oral mucosa Pulmonary: Some scattered rhonchi, improved effort Cardiac: S1-S2 appreciated Abdomen: Soft, Mid abdominal dressing intact, hypoactive bowel sounds, no tenderness Extremities: Warm, dry, scattered areas of ecchymosis, some dependent edema Neuro: Moving all extremities, no focality GU: Clear yellow  Resolved Hospital Problem list   SBO -post exploratory laparotomy  Assessment & Plan:   Chronic versus acute on chronic hypoxic respiratory failure.  This is multifactorial: Persistent right middle lobe  atelectasis/collapse with air bronchograms, bibasilar atelectasis, underlying COPD, and bilateral right greater than left effusions -Return from operating room on ventilatory support on 1/22-tolerating  ventilator support -Failed spontaneous breathing trial secondary to triggering apnea alarm, also low volumes Plan Now day #5 of Zosyn, Will plan on 7-day course Continue PAD protocol,  rass scale  0 Daily assessment for spontaneous breathing trial Continue bronchodilators VAP bundle Cont HT saline nebs  Ileus -Postoperative state -Hypoactive bowel sounds still with bilious drainage-appears to be improving -We will continue to monitor  Uncontrolled hypertension -We will resume Lopressor -enalapril IV -Hydralazine increased to 20 to be given every 4 as needed  Acute metabolic encephalopathy, superimposed on 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 Replace potassium  H/o lung cancer Plan Repeat imaging 3 to 4 weeks after discharge from a cancer standpoint is not a candidate for further diagnostics given poor functional status nutritional status and acute illness   H/o PAD s/p stent to Right external iliac on 10/22 Plan Resume Plavix when okay by surgical services  Malnutrition Plan TPA via PICC line  Mild anemia without evidence of bleeding Plan Trend CBC  Best practice    Diet: Continue TPN. Activity: Bedrest DVT: SCDs PUD: H2B Glycemic control: Currently on sliding scale insulin  Disposition: He is now postop day #4.   Very weak, appears stronger today, weaning sedation, tolerating pressure support He is very debilitated and likelihood of weaning in the short-term is poor Discussed with patient and girlfriend at bedside  Will be appropriate to wait until his abdomen settles under bit more before attempting to wean and extubate Very poor reserves at baseline   Critical care time spent evaluating, reviewing records, formulating plan of care 35 minutes

## 2018-07-04 NOTE — Progress Notes (Signed)
  Patient self extubated this afternoon 1822  Remains hemodynamically stable Remains stable respiratory wise with no shortness of breath, continues on oxygen supplementation Denies any neck/throat pain or discomfort

## 2018-07-04 NOTE — Progress Notes (Signed)
4 Days Post-Op   Subjective/Chief Complaint: Had some issues with hypertension yesterday that PCCM is treating.  Also TNA started.    Objective: Vital signs in last 24 hours: Temp:  [97.6 F (36.4 C)-100.9 F (38.3 C)] 99.3 F (37.4 C) (01/26 0400) Pulse Rate:  [77-113] 89 (01/26 0700) Resp:  [14-20] 17 (01/26 0700) BP: (131-199)/(67-96) 158/78 (01/26 0700) SpO2:  [73 %-100 %] 97 % (01/26 0700) FiO2 (%):  [28 %] 28 % (01/26 0344) Last BM Date: 06/28/18  Intake/Output from previous day: 01/25 0701 - 01/26 0700 In: 2890.2 [I.V.:2187.5; NG/GT:300; IV Piggyback:402.7] Out: 4175 [Urine:3775; Emesis/NG output:400] Intake/Output this shift: No intake/output data recorded.  Exam: On vent Opens eyes.  Moves all extremities.   Does not follow commands.   Abdomen soft, non-distended, dressing dry, NG output still bilous  Lab Results:  Recent Labs    07/01/18 0751 07/01/18 2124 07/02/18 0442  WBC 7.5  --  8.5  HGB 7.4* 9.6* 9.8*  HCT 24.0* 30.1* 31.0*  PLT 316  --  231   BMET Recent Labs    07/02/18 0442 07/03/18 0500  NA 141 142  K 3.5 2.9*  CL 112* 109  CO2 23 27  GLUCOSE 182* 122*  BUN 34* 32*  CREATININE 0.73 0.68  CALCIUM 8.3* 8.5*   PT/INR No results for input(s): LABPROT, INR in the last 72 hours. ABG Recent Labs    07/03/18 0425  PHART 7.433  HCO3 27.6    Studies/Results: Dg Chest Port 1 View  Result Date: 07/03/2018 CLINICAL DATA:  Pneumonia. Endotracheal tube. EXAM: PORTABLE CHEST 1 VIEW COMPARISON:  Chest x-ray dated 06/30/2018. FINDINGS: Endotracheal tube is stable in position with tip approximately 4 cm above the level of the carina. LEFT-sided PICC line is well positioned with tip at the level of the lower SVC/cavoatrial junction. Enteric tube appears adequately positioned in the stomach. Heart size and mediastinal contours are within normal limits. Atherosclerotic changes noted at the aortic arch. Persistent RIGHT middle lobe opacity. Mild  scarring/atelectasis within the bilateral mid lung regions. Lungs otherwise clear. No pleural effusion or pneumothorax seen. IMPRESSION: 1. Endotracheal tube is well positioned with tip approximately 4 cm above the level of the carina. 2. Otherwise stable chest x-ray. Persistent RIGHT middle lobe opacity corresponds to a chronic appearing RIGHT middle lobe atelectasis demonstrated on chest CT of 06/21/2018. No evidence of active pneumonia or pulmonary edema on today's exam. Electronically Signed   By: Franki Cabot M.D.   On: 07/03/2018 05:30    Anti-infectives: Anti-infectives (From admission, onward)   Start     Dose/Rate Route Frequency Ordered Stop   06/30/18 1000  piperacillin-tazobactam (ZOSYN) IVPB 3.375 g     3.375 g 12.5 mL/hr over 240 Minutes Intravenous Every 8 hours 06/30/18 0920     06/22/18 1300  cefTRIAXone (ROCEPHIN) 1 g in sodium chloride 0.9 % 100 mL IVPB     1 g 200 mL/hr over 30 Minutes Intravenous Every 24 hours 06/22/18 1202 06/26/18 1334   06/22/18 1300  azithromycin (ZITHROMAX) 500 mg in sodium chloride 0.9 % 250 mL IVPB     500 mg 250 mL/hr over 60 Minutes Intravenous Every 24 hours 06/22/18 1202 06/26/18 1513   06/18/18 0600  ceFAZolin (ANCEF) IVPB 2g/100 mL premix  Status:  Discontinued     2 g 200 mL/hr over 30 Minutes Intravenous On call to O.R. 06/18/18 0531 06/18/18 1211      Assessment/Plan: s/p Procedure(s): EXPLORATORY LAPAROTOMY LYSISIS OF ADHESIONS (  N/A) 06/30/2018 Marlou Starks for SBO.    Continuing NG until extubated Post op ileus Continue TPN Vent per CCM    LOS: 20 days    Stark Klein 07/04/2018

## 2018-07-04 NOTE — Procedures (Signed)
Extubation Procedure Note  Patient Details:   Name: [name] DOB: 24-Jun-1950 MRN: [id number]   Airway Documentation:    Vent end date: 07/04/18 Vent end time: 1743   Evaluation  O2 sats: stable throughout Complications: No apparent complications Patient did tolerate procedure well. Bilateral Breath Sounds: Clear, Diminished   Yes   Pt self extubated  Johnette Abraham 07/04/2018, 5:44 PM

## 2018-07-04 NOTE — Progress Notes (Signed)
PHARMACY - ADULT TOTAL PARENTERAL NUTRITION CONSULT NOTE   Pharmacy Consult for TPN Indication: SBO  Patient Measurements: Height: 5\' 2"  (157.5 cm) Weight: 81 lb 5.6 oz (36.9 kg) IBW/kg (Calculated) : 54.6 TPN AdjBW (KG): 36.9 Body mass index is 14.88 kg/m. 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 Presents this admission with SBO due to hernia, now s/p open inguinal hernia repair but CT still shows persistent SBO separate from hernia repair site - likely d/t edema/congestion from prior incarceration.  Central access:  PICC placed 1/14 - was removed 1/21 overnight by pt. PICC placed again on 1/23 TPN start date: 1/14   Significant events:  1/15: made NPO, chg MV, lytes to IV or in TPN 1/17: slight return of bowel function, advancing to clears 1/19 100% 2 meals , 75 % 1 meal and 0% 2 meals recorded. 2 boost/resource breeze supplements charted, KUB ordered by CCS 1/20: multiple BMs reported, still with poor PO intake.  GF reports poor intake at baseline 1/21: PO intake increasing but still minimal and not meeting requirements, great difficulty tolerating PO diet.  Pharmacy discussed with CCS who will continue TPN for now. 1/22: patient with confusion overnight, pulled NG and PICC line out.  Returned to OR for exp lap, LOA, and repair of 2 additional closed loop SBO sites.  TPN off overnight 1/21-1/22. 1/23: PICC line replaced - continue to hold TPN until new bag at 1800 d/t borderline K. Propofol weaned off (1/22-1/23).   Insulin Requirements: 4 units SSI in past 24 hours  Current Nutrition: TPN  IVF: NS @ 35 mL/hr per surgery  Today, 07/04/2018:  Glucose (CBG goal 100-150) - range 97-158 with minimal SSI (note low  TBW).  Electrolytes:  Na (138), K (3.2, goal >4), Cl (102), Corr Ca (9.3), Mg (2.1, goal >2), Phos (3)  Renal - SCr and BUN improved  LFTs -  AST/ALT improved  TGs - WNL (1/22)  Prealbumin - 11.3 (1/14), improved to 22.1 (1/20)  Bowel function:  Rn reports only very faint BS, no flatus, but abdomen is slightly softer and less distended.  Bilious NG output is lessening.   NUTRITIONAL GOALS                                                                                             RD recs: (updated 1/23) Kcal:  1128 kcal Protein:  74-92 grams Fluid:  >/= 1.5 L/day  Custom TPN at 65 ml/hr provides: Protein 59 g/l, 92 g/day Dextrose 8.5%, 133 g/day Lipids 20 g/l, 31.2 g/day Total 1131 Kcal/day  PLAN                                                                                                                         Now:  KCl 10 mEq IV x3 run  At 1800 today:  Continue Custom TPN at goal rate 65 ml/hr.  Provides 100% of protein and Kcal goals  TPN to contain standard multivitamins and trace element daily.  Electrolytes:  Cont Na 70 mEq/L, increase K to 60 mEq/L, cont Ca 5 mEq/L, cont Mag 20 mEq/L, increase to Phos 10 mmol/L.  Change to 1:1 Cl: Ac ratio.  Famotidine 20 mg in TPN  Continue IVF, NS at 35 ml/hr per surgery.  Continue sensitive SSI, CBGs q6h.   TPN lab panels on Mondays & Thursdays.   F/u ability to advance of diet once pt is having BS and flatus, then wean TPN when tolerating 60% of enteral diet.   Gretta Arab PharmD, BCPS Pager [phone] 07/04/2018 9:26 AM

## 2018-07-05 ENCOUNTER — Inpatient Hospital Stay (HOSPITAL_COMMUNITY): Payer: Medicare Other

## 2018-07-05 DIAGNOSIS — F17200 Nicotine dependence, unspecified, uncomplicated: Secondary | ICD-10-CM

## 2018-07-05 LAB — GLUCOSE, CAPILLARY
Glucose-Capillary: 123 mg/dL — ABNORMAL HIGH (ref 70–99)
Glucose-Capillary: 130 mg/dL — ABNORMAL HIGH (ref 70–99)
Glucose-Capillary: 140 mg/dL — ABNORMAL HIGH (ref 70–99)
Glucose-Capillary: 165 mg/dL — ABNORMAL HIGH (ref 70–99)

## 2018-07-05 LAB — COMPREHENSIVE METABOLIC PANEL
ALK PHOS: 130 U/L — AB (ref 38–126)
ALT: 34 U/L (ref 0–44)
AST: 38 U/L (ref 15–41)
Albumin: 2.5 g/dL — ABNORMAL LOW (ref 3.5–5.0)
Anion gap: 8 (ref 5–15)
BUN: 33 mg/dL — ABNORMAL HIGH (ref 8–23)
CO2: 32 mmol/L (ref 22–32)
Calcium: 8.7 mg/dL — ABNORMAL LOW (ref 8.9–10.3)
Chloride: 101 mmol/L (ref 98–111)
Creatinine, Ser: 0.68 mg/dL (ref 0.61–1.24)
GFR calc Af Amer: >60 mL/min (ref >60)
Glucose, Bld: 163 mg/dL — ABNORMAL HIGH (ref 70–99)
Potassium: 3.6 mmol/L (ref 3.5–5.1)
Sodium: 141 mmol/L (ref 135–145)
Total Bilirubin: 1.2 mg/dL (ref 0.3–1.2)
Total Protein: 5.8 g/dL — ABNORMAL LOW (ref 6.5–8.1)

## 2018-07-05 LAB — CBC
HCT: 32.5 % — ABNORMAL LOW (ref 39.0–52.0)
Hemoglobin: 10.6 g/dL — ABNORMAL LOW (ref 13.0–17.0)
MCH: 31.1 pg (ref 26.0–34.0)
MCHC: 32.6 g/dL (ref 30.0–36.0)
MCV: 95.3 fL (ref 80.0–100.0)
Platelets: 221 10*3/uL (ref 150–400)
RBC: 3.41 MIL/uL — ABNORMAL LOW (ref 4.22–5.81)
RDW: 15.2 % (ref 11.5–15.5)
WBC: 8.9 10*3/uL (ref 4.0–10.5)
nRBC: 0 % (ref 0.0–0.2)

## 2018-07-05 LAB — DIFFERENTIAL
Abs Immature Granulocytes: 0.06 10*3/uL (ref 0.00–0.07)
Basophils Absolute: 0 10*3/uL (ref 0.0–0.1)
Basophils Relative: 0 %
Eosinophils Absolute: 0 10*3/uL (ref 0.0–0.5)
Eosinophils Relative: 0 %
Immature Granulocytes: 1 %
LYMPHS ABS: 0.5 10*3/uL — AB (ref 0.7–4.0)
Lymphocytes Relative: 6 %
Monocytes Absolute: 0.7 10*3/uL (ref 0.1–1.0)
Monocytes Relative: 7 %
Neutro Abs: 7.6 10*3/uL (ref 1.7–7.7)
Neutrophils Relative %: 86 %

## 2018-07-05 LAB — TRIGLYCERIDES: Triglycerides: 47 mg/dL (ref <150)

## 2018-07-05 LAB — PHOSPHORUS: Phosphorus: 3.8 mg/dL (ref 2.5–4.6)

## 2018-07-05 LAB — PREALBUMIN: Prealbumin: 10.1 mg/dL — ABNORMAL LOW (ref 18–38)

## 2018-07-05 LAB — MAGNESIUM: MAGNESIUM: 2.2 mg/dL (ref 1.7–2.4)

## 2018-07-05 MED ORDER — LORAZEPAM 2 MG/ML IJ SOLN: 1.0000 mg | Freq: Four times a day (QID) | INTRAMUSCULAR | Status: DC | PRN

## 2018-07-05 MED ORDER — ORAL CARE MOUTH RINSE
15.0000 mL | Freq: Two times a day (BID) | OROMUCOSAL | Status: DC
  Administered 2018-07-05 – 2018-07-10 (×10): 15 mL via OROMUCOSAL

## 2018-07-05 MED ORDER — FENTANYL CITRATE (PF) 100 MCG/2ML IJ SOLN
12.5000 ug | INTRAMUSCULAR | Status: DC | PRN
  Administered 2018-07-05: 12.5 ug via INTRAVENOUS
  Filled 2018-07-05: qty 2

## 2018-07-05 MED ORDER — METHOCARBAMOL 1000 MG/10ML IJ SOLN
500.0000 mg | Freq: Three times a day (TID) | INTRAVENOUS | Status: DC | PRN
  Filled 2018-07-05: qty 5

## 2018-07-05 MED ORDER — TRAVASOL 10 % IV SOLN
INTRAVENOUS | Status: AC
  Administered 2018-07-05: 17:00:00 via INTRAVENOUS
  Filled 2018-07-05: qty 920.4

## 2018-07-05 MED ORDER — HYDRALAZINE HCL 20 MG/ML IJ SOLN: 20.0000 mg | INTRAMUSCULAR | Status: DC | PRN

## 2018-07-05 MED ORDER — IPRATROPIUM-ALBUTEROL 0.5-2.5 (3) MG/3ML IN SOLN
3.0000 mL | Freq: Four times a day (QID) | RESPIRATORY_TRACT | Status: DC
  Administered 2018-07-05 – 2018-07-10 (×19): 3 mL via RESPIRATORY_TRACT
  Filled 2018-07-05 (×19): qty 3

## 2018-07-05 MED ORDER — ENOXAPARIN SODIUM 30 MG/0.3ML ~~LOC~~ SOLN
30.0000 mg | SUBCUTANEOUS | Status: DC
  Administered 2018-07-05 – 2018-07-12 (×8): 30 mg via SUBCUTANEOUS
  Filled 2018-07-05 (×8): qty 0.3

## 2018-07-05 NOTE — Progress Notes (Signed)
Physical Therapy Treatment Patient Details Name: [name] MRN: [id number] DOB: [birth year] Today's Date: 07/05/2018    History of Present Illness   68 yo M  with COPD  presenting 1/5 with abdominal pain, bilateral inguinal hernias with small bowel obstruction. Underwent hernia repair 1/10. Return to surgery 1/22 for EXPLORATORY LAPAROTOMY LYSISIS OF ADHESIONS , Persistent right middle lobe atelectasis/collapse with air bronchograms, bibasilar atelectasis, underlying COPD, and bilateral right greater than left effusions,extubated 1/26.    PT Comments    The patient has had a change in functional mobility, MS and strength, requiring supplemental oxygen since last PT visit. Patient is  Much weaker. HR 137 , O2 sats 87% on 4 liters [place] for  Mobility to recliner.  Patient may require post acute rehab prior to Dc to home. Continue PT .  Follow Up Recommendations  SNF     Equipment Recommendations  Rolling walker with 5" wheels    Recommendations for Other Services       Precautions / Restrictions Precautions Precautions: Fall Precaution Comments: O2,monitor sats and HR Restrictions Weight Bearing Restrictions: No    Mobility  Bed Mobility Overal bed mobility: Needs Assistance Bed Mobility: Supine to Sit     Supine to sit: Min assist     General bed mobility comments: assist trunk  Transfers Overall transfer level: Needs assistance Equipment used: 1 person hand held assist Transfers: Sit to/from Stand;Stand Pivot Transfers Sit to Stand: Mod assist Stand pivot transfers: Mod assist       General transfer comment: steady assist to stand , pivot to [facility], stood for pericare. Then assisted to  recliner.  Ambulation/Gait         Gait velocity: NT, HR 130's       Stairs             Wheelchair Mobility    Modified Rankin (Stroke Patients Only)       Balance Overall balance assessment: Needs assistance;History of Falls Sitting-balance support: No  upper extremity supported;Feet supported Sitting balance-Leahy Scale: Fair     Standing balance support: Bilateral upper extremity supported Standing balance-Leahy Scale: Poor Standing balance comment: stood with UE support                            Cognition Arousal/Alertness: Awake/alert Behavior During Therapy: Restless Overall Cognitive Status: No family/caregiver present to determine baseline cognitive functioning Area of Impairment: Orientation;Following commands;Problem solving                 Orientation Level: Time;Place;Situation           Problem Solving: Slow processing;Decreased initiation;Difficulty sequencing;Requires verbal cues;Requires tactile cues        Exercises      General Comments        Pertinent Vitals/Pain Faces Pain Scale: Hurts little more Pain Location: ABD recent surgery Pain Descriptors / Indicators: Aching;Tightness Pain Intervention(s): Monitored during session    Home Living                      Prior Function            PT Goals (current goals can now be found in the care plan section) Acute Rehab PT Goals Patient Stated Goal: not stated PT Goal Formulation: With patient Time For Goal Achievement: 07/19/18 Potential to Achieve Goals: Fair Progress towards PT goals: Goals downgraded-see care plan    Frequency    Min 2X/week  PT Plan Current plan remains appropriate;Frequency needs to be updated;Discharge plan needs to be updated    Co-evaluation              AM-PAC PT "6 Clicks" Mobility   Outcome Measure  Help needed turning from your back to your side while in a flat bed without using bedrails?: A Lot Help needed moving from lying on your back to sitting on the side of a flat bed without using bedrails?: A Lot Help needed moving to and from a bed to a chair (including a wheelchair)?: A Lot Help needed standing up from a chair using your arms (e.g., wheelchair or bedside  chair)?: A Lot Help needed to walk in hospital room?: Total Help needed climbing 3-5 steps with a railing? : Total 6 Click Score: 10    End of Session   Activity Tolerance: Patient limited by fatigue Patient left: in chair;with chair alarm set Nurse Communication: Mobility status PT Visit Diagnosis: Unsteadiness on feet (R26.81)     Time: 9326-7124 PT Time Calculation (min) (ACUTE ONLY): 31 min  Charges:  $Therapeutic Activity: 8-22 mins               Re-eval         Claretha Cooper 07/05/2018, 2:09 PM

## 2018-07-05 NOTE — Progress Notes (Signed)
PHARMACY - ADULT TOTAL PARENTERAL NUTRITION CONSULT NOTE   Pharmacy Consult for TPN Indication: SBO  Patient Measurements: Height: 5\' 2"  (157.5 cm) Weight: 81 lb 5.6 oz (36.9 kg) IBW/kg (Calculated) : 54.6 TPN AdjBW (KG): 36.9 Body mass index is 14.88 kg/m. 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 Presents this admission with SBO due to hernia, now s/p open inguinal hernia repair but CT still shows persistent SBO separate from hernia repair site - likely d/t edema/congestion from prior incarceration.  Central access:  PICC placed 1/14 - was removed 1/21 overnight by pt. PICC placed again on 1/23 TPN start date: 1/14   Significant events:  1/15: made NPO, chg MV, lytes to IV or in TPN 1/17: slight return of bowel function, advancing to clears 1/19 100% 2 meals , 75 % 1 meal and 0% 2 meals recorded. 2 boost/resource breeze supplements charted, KUB ordered by CCS 1/20: multiple BMs reported, still with poor PO intake.  GF reports poor intake at baseline 1/21: PO intake increasing but still minimal and not meeting requirements, great difficulty tolerating PO diet.  Pharmacy discussed with CCS who will continue TPN for now. 1/22: patient with confusion overnight, pulled NG and PICC line out.  Returned to OR for exp lap, LOA, and repair of 2 additional closed loop SBO sites.  TPN off overnight 1/21-1/22. 1/23: PICC line replaced - continue to hold TPN until new bag at 1800 d/t borderline K. Propofol weaned off (1/22-1/23).  1/27: self-extubated 1/26, pulled out IV's, NG today. TPN resumed. Lovenox resumed, reduced to 30mg /day  Insulin Requirements: 6 units SSI in past 24 hours  Current Nutrition: TPN  IVF: NS @ 35 mL/hr per  surgery  Today, 07/05/2018:  Glucose (CBG goal 100-150) - range 123-165 (increased) with minimal SSI (note low TBW).  Electrolytes:  Na (141), K (3.6, goal >4), Cl (101), Corr Ca (9.9), Mg (2.2, goal >2), Phos (3.8)  Renal - SCr and BUN improved  LFTs -  AST/ALT improved  TGs - WNL (1/22), 42 (1/27)  Prealbumin - 11.3 (1/14), improved to 22.1 (1/20), 10.1 (1/27)  Bowel function: NG output 350 ml/24 hr   NUTRITIONAL GOALS                                                                                             RD recs: (updated 1/23) Kcal:  1128 kcal Protein:  74-92 grams Fluid:  >/= 1.5  L/day  Custom TPN at 65 ml/hr provides: Protein 59 g/l, 92 g/day Dextrose 8.5%, 133 g/day Lipids 20 g/l, 31.2 g/day Total 1131 Kcal/day  PLAN                                                                                                                          At 1800 today:  Continue Custom TPN at goal rate 65 ml/hr.  Provides 100% of protein and Kcal goals  TPN to contain standard multivitamins and trace element daily.  Electrolytes: Decr Na to 65 mEq/L, increase K to 65 mEq/L, cont Ca 5 mEq/L, cont Mag 20 mEq/L, cont Phos 10 mmol/L.  Continue 1:1 Cl: Ac ratio.  Famotidine 20 mg in TPN  Continue IVF, NS at 35 ml/hr per surgery.  Continue sensitive SSI, CBGs q6h.   TPN lab panels on Mondays & Thursdays.   F/u ability to advance of diet once pt is having BS and flatus, then wean TPN when tolerating 60% of enteral diet.  Minda Ditto PharmD Pager [phone] 07/05/2018, 8:55 AM

## 2018-07-05 NOTE — Evaluation (Addendum)
Clinical/Bedside Swallow Evaluation Patient Details  Name: [name] MRN: [id number] Date of Birth: 1950/10/05  Today's Date: 07/05/2018 Time: SLP Start Time (ACUTE ONLY): 1422 SLP Stop Time (ACUTE ONLY): 1505 SLP Time Calculation (min) (ACUTE ONLY): 43 min  Past Medical History:  Past Medical History:  Diagnosis Date  . COPD (chronic obstructive pulmonary disease) ([place])   . Hypertension   . Iliac artery stenosis, right ([facility])    80% Stenosis  . 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[place])   . Lower limb ischemia 03/26/2018  . Lung cancer dx'd 12/2014  . Pancreatitis    x2 stents placed to make patent duct to pancreas  . Pulmonary hypertension ([place])   . Severe claudication ([place])   . Status post left foot surgery   . Tobacco abuse    Past Surgical History:  Past Surgical History:  Procedure Laterality Date  . ABDOMINAL AORTOGRAM W/LOWER EXTREMITY N/A 03/30/2018   Procedure: ABDOMINAL AORTOGRAM W/LOWER EXTREMITY;  Surgeon: Nigel Mormon, MD;  Location: [place] [facility];  Service: Cardiovascular;  Laterality: N/A;  . COLECTOMY    . COLOSTOMY    . COLOSTOMY REVERSAL    . EUS N/A 12/14/2014   Procedure: UPPER ENDOSCOPIC ULTRASOUND (EUS) LINEAR;  Surgeon: Milus Banister, MD;  Location: [facility];  Service: Endoscopy;  Laterality: N/A;  . FOOT SURGERY Left 2006  . INGUINAL HERNIA REPAIR Left 06/18/2018   Procedure: OPEN RECURRENT LEFT INGUINAL HERNIA REPAIR WITH MESH;  Surgeon: Armandina Gemma, MD;  Location: [facility];  Service: General;  Laterality: Left;  . INSERTION OF MESH Left 06/18/2018   Procedure: INSERTION OF MESH;  Surgeon: Armandina Gemma, MD;  Location: [facility];  Service: General;  Laterality: Left;  . LAPAROTOMY N/A 06/30/2018   Procedure: EXPLORATORY LAPAROTOMY LYSISIS OF ADHESIONS;  Surgeon: Jovita Kussmaul, MD;  Location: [facility];  Service: General;  Laterality: N/A;  . PANCREAS SURGERY  2007  . PERIPHERAL VASCULAR INTERVENTION  03/30/2018   Procedure: PERIPHERAL VASCULAR  INTERVENTION;  Surgeon: Nigel Mormon, MD;  Location: [place] [facility];  Service: Cardiovascular;;  Rt Iliac  . UPPER GI ENDOSCOPY    . VENTRAL HERNIA REPAIR  1967   HPI:  [name] is a 68 yo male adm to [facility] with abdomen pain Has h/o COPD, lung cancer 2016 and 2019 - s/p radiation tx in 2019 (single tx per wife).  Pt also with h/o pancreatitis.  Pt required surgery (bowel obstruction) and was intubated 1/22-1/26/20 when he self extubated.  In addition, he also had NG which he self removed also.  Swallow eval ordered.  Pt denies dysphagia from XRT or prior to admit. Has h/o COPD, lung cancer 2016 and 2019 - s/p radiation tx in 2019 (single tx per wife).  Pt also with h/o pancreatitis.  Pt required surgery (bowel obstruction) and was intubated 1/22-1/26/20 when he self extubated.  In addition, he also had NG which he self removed also.  Swallow eval ordered.  Pt denies dysphagia from XRT or prior to admit.   Assessment / Plan / Recommendation Clinical Impression  Patient presents with clinical indications concerning for aspiration with po observed *water via cup* c/b post-swallow cough.  He does have h/o dysphagia as he admits to h/o coughing with liquids prior to admit.  Given pt's COPD, intubation x5 days with self extubation and NG placement also with self removal- suspect acute exacerbation of baseline dysphagia.  Possible pharyngeal edema and sensory impairment may lend to silent aspiration.  No overt  indication of aspiration with tsps water and single ice chips.  Spoke to [place] with surgery  SLP Visit Diagnosis: Dysphagia, unspecified (R13.10)    Aspiration Risk  Moderate aspiration risk    Diet Recommendation NPO;Ice chips PRN after oral care   Liquid Administration via: Spoon Medication Administration: Via alternative means Supervision: Full supervision/cueing for compensatory strategies Compensations: Slow rate;Small sips/bites Postural Changes: Seated upright at 90 degrees;Remain  upright for at least 30 minutes after po intake    Other  Recommendations Oral Care Recommendations: Other (Comment)(oral care TID) Other Recommendations: Have oral suction available   Follow up Recommendations (tbd)      Frequency and Duration   tbd         Prognosis   tbd     Swallow Study   General Date of Onset: 07/05/18 HPI: [name] is a 68 yo male adm to [facility] with abdomen pain Has h/o COPD, lung cancer 2016 and 2019 - s/p radiation tx in 2019 (single tx per wife).  Pt also with h/o pancreatitis.  Pt required surgery (bowel obstruction) and was intubated 1/22-1/26/20 when he self extubated.  In addition, he also had NG which he self removed also.  Swallow eval ordered.  Pt denies dysphagia from XRT or prior to admit. Type of Study: Bedside Swallow Evaluation Previous Swallow Assessment: none in system Diet Prior to this Study: NPO Temperature Spikes Noted: No Respiratory Status: Nasal cannula History of Recent Intubation: Yes Length of Intubations (days): 5 days Date extubated: 07/04/18 Behavior/Cognition: Alert;Cooperative Oral Cavity Assessment: Dry;Dried secretions Oral Care Completed by SLP: Yes Oral Cavity - Dentition: Other (Comment);Edentulous Vision: Functional for self-feeding Self-Feeding Abilities: Able to feed self Patient Positioning: Upright in bed Baseline Vocal Quality: Low vocal intensity Volitional Cough: Strong Volitional Swallow: Unable to elicit    Oral/Motor/Sensory Function Overall Oral Motor/Sensory Function: Generalized oral weakness   Ice Chips Ice chips: Within functional limits Presentation: Spoon;Self Fed   Thin Liquid Thin Liquid: Impaired Presentation: Cup;Self Fed;Spoon Pharyngeal  Phase Impairments: Cough - Immediate;Throat Clearing - Immediate    Nectar Thick Nectar Thick Liquid: Not tested   Honey Thick Honey Thick Liquid: Not tested   Puree Puree: Not tested   Solid     Solid: Not tested      Macario Golds 07/05/2018,3:29 PM  Luanna Salk, MS [facility] SLP [place] Pager [phone] Office [phone]

## 2018-07-05 NOTE — Progress Notes (Signed)
NAME:  [name], MRN:  [id number], DOB:  07-14-1950, LOS: 21 ADMISSION DATE:  06/13/2018, CONSULTATION DATE:  06/18/18 REFERRING MD:  Ernst Spell CHIEF COMPLAINT:  Hypoxia, agitation   Brief History   68 yo M quit smoking sev weeks pta with COPD GOLD III criteria 2016  s/p inguinal hernia repair 1/10, after presenting 1/5 with abdominal pain, bilateral inguinal hernias with small bowel obstruction. Underwent hernia repair 1/10. Post operatively patient is agitated and had SpO2 high 80s to low 90s; PCCM asked to consult for hypoxia. Status post exploratory laparotomy on 06/30/2018, currently on ventilator 07/03/2018-uneventful night, blood pressure trending higher 1/26 awake and interactive, tolerating weaning today on pressure support  Past Medical History  COPD - PFT's  11/17/14 FEV1 1/25 (49 % ) ratio 49  with DLCO  35 % corrects to 43 % for alv volume and classic copd curvature to f/v  , HTN, PVD, NSCLC s/p SBRT to left lung  EtOH abuse, Recurrent inguinal hernia  Significant Hospital Events   1/5 Admission 1/10 L inguinal hernia repair. Transfer to SDU for hypoxia post-operative 1/11 mild sedation from ativan, on 5L  1/15 Confusion overnight > started on steroids 1/14, started on TNA 1/22: Ongoing abdominal ileus versus obstruction.  Surgical service is planning bringing back for exploration.  Pulmonary asked to see in regards to abnormal CT findings specifically right middle lobe collapse/atelectasis and persistent bilateral effusions and basilar atelectasis 1/23: Awake, interactive, will be receiving transfusion today 1/25: Blood pressure trending higher, low-grade temps 1/26-weaning sedation, tolerating pressure support 1/26-self extubated, tolerated  Consults:  Surgery PCCM  Procedures:  1/10 L inguinal hernia repair 1/22/exploratory laparotomy and adhesiolysis  Significant Diagnostic Tests:  1/5 CT a/p >> bilatera hiatal hernia, distal small bowel obstruction. Transition  point located within L inguinal hernia. R hiatal hernia containing ascites  1/10 CXR >> RLL opacity 1/25 chest x-ray-reviewed by myself, chronic right lower lobe infiltrate, tube appropriately positioned  Micro Data:  1/10 MRSA >> neg  Antimicrobials:  Zosyn 1/22 >>   Interim history/subjective:  Self extubated 1/26, tolerated well Currently comfortable respiratory pattern on 5 to 6 L/min [place] Perseverating but redirectable, wants something to drink  Objective   Blood pressure (!) 174/87, pulse (!) 108, temperature 99 F (37.2 C), temperature source Oral, resp. rate 19, height 5\' 2"  (1.575 m), weight 36.9 kg, SpO2 94 %. on 2.5 lpm     Vent Mode: PRVC FiO2 (%):  [28 %] 28 % Set Rate:  [16 bmp] 16 bmp Vt Set:  [450 mL] 450 mL PEEP:  [5 cmH20] 5 cmH20 Pressure Support:  [10 cmH20] 10 cmH20 Plateau Pressure:  [18 cmH20] 18 cmH20   Intake/Output Summary (Last 24 hours) at 07/05/2018 0949 Last data filed at 07/05/2018 0400 Gross per 24 hour  Intake 2416.76 ml  Output 2050 ml  Net 366.76 ml   Filed Weights   06/22/18 0845 06/25/18 1418 06/30/18 1300  Weight: 38 kg 34.4 kg 36.9 kg   Examination: General: Thin man, lying comfortably on nasal cannula oxygen in no distress HEENT temporal wasting, oropharynx dry, pupils equal Pulmonary: Very distant, decreased at both bases, no wheezing Cardiac: Regular, no murmur Abdomen: Soft, midline staples are clean and dry with no surrounding erythema, hypoactive bowel sounds Extremities: No deformities, no lower extremity edema Neuro: Wake, alert, occasionally asking repeat questions consistent with poor short-term memory or encephalopathy, follows commands and is easily redirectable, moves all extremities  Resolved Hospital Problem list   SBO -  post exploratory laparotomy  Assessment & Plan:   Chronic versus acute on chronic hypoxic respiratory failure.  This is multifactorial: Persistent right middle lobe atelectasis/collapse with air  bronchograms, bibasilar atelectasis, underlying COPD, and bilateral right greater than left effusions  Plan Now day #6 of Zosyn, Will plan on 7-day course Continue to push pulmonary hygiene, tolerated extubation well Agree with formal swallowing eval before we let him take p.o. Continue his bronchodilators, change DuoNeb to 4 times daily.  Continue Pulmicort.  He was on DuoNeb as an outpatient and can likely continue this 4 times daily at discharge.  Would be reasonable to continue Pulmicort nebs twice daily as well Pulmonary hygiene, hypertonic saline nebs Wean FiO2 as able.  I suspect that he will need home oxygen (he may have needed it even before this hospitalization) I discussed smoking cessation with him today.  He has not had a cigarette since the admission and appears to be willing to consider stopping altogether  Ileus Following, okay for diet per surgery when he passes a swallow evaluation  Uncontrolled hypertension Transition back to his home regimen when he can take p.o. 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  He will need to follow-up with them.  Unclear that he could tolerate any other intervention if he did have a recurrence   H/o PAD s/p stent to Right external iliac on 10/22 Plan Resume Plavix when cleared to do so postop by surgery  Malnutrition Plan Has received TPN, goal start enteral feeding when he passes swallow evaluation  Mild anemia without evidence of bleeding Plan CBC  Best practice    Diet: Continue TPN. Activity: Bedrest DVT: SCDs PUD: H2B Glycemic control: Currently on sliding scale insulin   Baltazar Apo, MD, PhD 07/05/2018, 10:00 AM [facility] [phone] or if no answer [phone]

## 2018-07-05 NOTE — Progress Notes (Signed)
Nutrition Follow-up  DOCUMENTATION CODES:   Underweight, Severe malnutrition in context of chronic illness  INTERVENTION:    Monitor for diet advancement/toleration  Pharmacy to adjust TPN to meet needs    Daily weights  NUTRITION DIAGNOSIS:   Severe Malnutrition related to chronic illness(COPD) as evidenced by energy intake < or equal to 75% for > or equal to 1 month, severe fat depletion, severe muscle depletion, percent weight loss.  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 Presents this admission with SBO due to hernia.    1/10- open inguinal hernia repair  1/13- full liquid diet 1/14- TPN started 1/18- diet advanced to soft  1/22- OR for ex lap, LOA, repair closed loop SBO sites x2- PICC pulled  1/23- PICC replaced 1/26- self extubated  1/27- NGT out   Pt awaiting swallow evaluation. Per surgery pt can advance to clears once cleared by SLP. Pt eager to eat. Will provide supplementation as warranted. Denies any nausea/vomiting. Nursing reports pt is having ongoing diarrhea.   Pt receiving TPN @ 65 ml/hr to provide 92 grams protein and 1131 kcal. Needs to be adjusted now that pt is extubated.   A weight has not been obtained since last RD visit on 1/22. Recommend checking daily weights to monitor trends. RD to order.  Medications reviewed and include: SSI, 10 mEq KCl  Labs reviewed: CBG 123-165   Diet Order:   Diet Order            Diet NPO time specified  Diet effective now              EDUCATION NEEDS:   Education needs have been  addressed  Skin:  Skin Assessment: Skin Integrity Issues: Skin Integrity Issues:: Incisions Incisions: abdominal (1/22)  Last BM:  1/27- diarrhea  Height:   Ht Readings from Last 1 Encounters:  07/03/18 5\' 2"  (1.575 m)    Weight:   Wt Readings from Last 1 Encounters:  06/30/18 36.9 kg    Ideal Body Weight:  53.6 kg  BMI:  Body mass index is 14.88 kg/m.  Estimated Nutritional Needs:   Kcal:  1500-1700 kcal  Protein:  75-95 grams  Fluid:  >/= 1.5 L/day   Mariana Single RD, LDN Clinical Nutrition Pager # - [phone]

## 2018-07-05 NOTE — Progress Notes (Signed)
Brooke from Surgery paged and notified that patient has pulled his NG tube out, disconnected his IV fluids and TPN, removed SCD's, asking for ice and will not leave mitts on, restless. She will be up to see him in a few minutes. Explained to patient importance of not removing tubes or equipment and that someone would be up to see him in a few minutes.

## 2018-07-05 NOTE — Progress Notes (Signed)
[facility] Progress Note  5 Days Post-Op  Subjective: CC-  Patient self extubated yesterday around 1800. Has been stable on 6L [place] since.  This morning he pulled out his NG tube and unhooked TPN. States that his abdomen is sore but denies any pain. Denies any n/v. RN confirmed that he did have a small/mucousy BM yesterday. States that he passed some gas last night, none so far this morning. Feels hungry.  Objective: Vital signs in last 24 hours: Temp:  [98 F (36.7 C)-99 F (37.2 C)] 99 F (37.2 C) (01/27 0000) Pulse Rate:  [93-119] 119 (01/27 0600) Resp:  [12-23] 17 (01/27 0600) BP: (109-197)/(59-144) 197/144 (01/27 0820) SpO2:  [72 %-100 %] 100 % (01/27 0600) FiO2 (%):  [28 %] 28 % (01/26 1625) Last BM Date: 06/28/18  Intake/Output from previous day: 01/26 0701 - 01/27 0700 In: 2416.8 [I.V.:1721.9; NG/GT:250; IV Piggyback:444.9] Out: 2050 [Urine:1850; Emesis/NG output:200] Intake/Output this shift: No intake/output data recorded.  PE: Gen:  Alert, NAD, cooperative HEENT: EOM's intact, pupils equal and round Card:  RRR Pulm:  Few scattered rhonchi, no wheezing, effort normal on 6L [place] Abd: Soft, nondistended, nontender, hypoactive BS, midline incision cdi with staples intact and no erythema or drainage Ext:  Calves soft and nontender, no edema Skin: warm and dry  Lab Results:  Recent Labs    07/05/18 0426  WBC 8.9  HGB 10.6*  HCT 32.5*  PLT 221   BMET Recent Labs    07/04/18 0738 07/05/18 0426  NA 138 141  K 3.2* 3.6  CL 102 101  CO2 30 32  GLUCOSE 117* 163*  BUN 34* 33*  CREATININE 0.67 0.68  CALCIUM 8.2* 8.7*   PT/INR No results for input(s): LABPROT, INR in the last 72 hours. CMP     Component Value Date/Time   NA 141 07/05/2018 0426   NA 137 05/11/2017 1437   NA 133 (L) 10/06/2016 1220   K 3.6 07/05/2018 0426   K 4.7 10/06/2016 1220   CL 101 07/05/2018 0426   CO2 32 07/05/2018 0426   CO2 26 10/06/2016 1220   GLUCOSE 163 (H)  07/05/2018 0426   GLUCOSE 99 10/06/2016 1220   BUN 33 (H) 07/05/2018 0426   BUN 8 05/11/2017 1437   BUN 12.0 10/06/2016 1220   CREATININE 0.68 07/05/2018 0426   CREATININE 0.8 10/06/2016 1220   CALCIUM 8.7 (L) 07/05/2018 0426   CALCIUM 10.1 10/06/2016 1220   PROT 5.8 (L) 07/05/2018 0426   PROT 7.3 11/20/2014 1352   ALBUMIN 2.5 (L) 07/05/2018 0426   ALBUMIN 3.6 11/20/2014 1352   AST 38 07/05/2018 0426   AST 39 (H) 11/20/2014 1352   ALT 34 07/05/2018 0426   ALT 20 11/20/2014 1352   ALKPHOS 130 (H) 07/05/2018 0426   ALKPHOS 131 11/20/2014 1352   BILITOT 1.2 07/05/2018 0426   BILITOT 0.29 11/20/2014 1352   GFRNONAA >60 07/05/2018 0426   GFRAA >60 07/05/2018 0426   Lipase     Component Value Date/Time   LIPASE 53 (H) 06/13/2018 1924       Studies/Results: Dg Chest Port 1 View  Result Date: 07/05/2018 CLINICAL DATA:  68 year old male respiratory failure. EXAM: PORTABLE CHEST 1 VIEW COMPARISON:  07/03/2018 chest x-ray.  06/21/2018 chest CT. FINDINGS: Endotracheal tube has been removed. Left PICC line tip distal superior vena cava level unchanged. Nasogastric tube side hole just beyond the gastroesophageal junction. Tip not imaged on the current exam. Bilateral pleural effusions possibly with  basilar atelectasis and/or infiltrate. Consolidation right middle lobe and lingula unchanged (as noted on prior CT). Heart size within normal limits.  Calcified aorta. IMPRESSION: 1. Endotracheal tube has been removed. 2. Otherwise no significant change including bilateral pleural effusions possibly with basilar atelectasis and/or infiltrate. Consolidation right middle lobe and lingula unchanged (as noted on prior CT). 3.  Aortic Atherosclerosis (ICD10-I70.0). Electronically Signed   By: Genia Del M.D.   On: 07/05/2018 07:20    Anti-infectives: Anti-infectives (From admission, onward)   Start     Dose/Rate Route Frequency Ordered Stop   06/30/18 1000  piperacillin-tazobactam (ZOSYN) IVPB  3.375 g     3.375 g 12.5 mL/hr over 240 Minutes Intravenous Every 8 hours 06/30/18 0920     06/22/18 1300  cefTRIAXone (ROCEPHIN) 1 g in sodium chloride 0.9 % 100 mL IVPB     1 g 200 mL/hr over 30 Minutes Intravenous Every 24 hours 06/22/18 1202 06/26/18 1334   06/22/18 1300  azithromycin (ZITHROMAX) 500 mg in sodium chloride 0.9 % 250 mL IVPB     500 mg 250 mL/hr over 60 Minutes Intravenous Every 24 hours 06/22/18 1202 06/26/18 1513   06/18/18 0600  ceFAZolin (ANCEF) IVPB 2g/100 mL premix  Status:  Discontinued     2 g 200 mL/hr over 30 Minutes Intravenous On call to O.R. 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 Neg Ct head Hypertension PAD/critical left limb ischemia- non healing leftleft first metatarsal head -Vascular has seen the patient SevereMalnutrition(BMI 16.33) - prealbumin 10.1 (1/26), continue TPN Internal hemorrhoids -Seen by Dr. Thana Farr in April 2019, s/p rubber band ligation -Analapram prn. Outpt follow up  SBO S/p Open recurrent left inguinal hernia repair with mesh, Dr. Harlow Asa, 06/18/18 S/p EXPLORATORY LAPAROTOMY LYSISIS OF ADHESIONS, Dr. Marlou Starks, 06/30/18 - POD 5 from most recent surgery, honeycomb dressing removed - Transfer out of ICU today. D/c foley. Ok to keep NG tube out. Patient is having bowel function. Will ask speech therapy to see for swallow evaluation, ok to advance to clear liquid diet if he passes swallow eval. Continue TPN for now. Encourage OOB/to chair, ambulate.   FEN:NPO;TPN  ID: Preop Ancef; Azithromycin & Rocephin (COPD)1/14- 1/18, zosyn 1/22>> day#6/7 (possible aspiration) DVT: Lovenox Follow-up with Dr.Gerkin   LOS: 21 days    Wellington Hampshire , [facility] 07/05/2018, 8:41 AM Pager: [phone] Mon-Thurs 7:00 am-4:30 pm Fri 7:00 am -11:30  AM Sat-Sun 7:00 am-11:30 am

## 2018-07-05 NOTE — Progress Notes (Signed)
OT Cancellation Note  Patient Details Name: [name] MRN: [id number] DOB: [birth year]   Cancelled Treatment:    Reason Eval/Treat Not Completed: Patient at procedure or test/ unavailable  Pt working with SLP when OT checked on pt- will check back next day Kari Baars, [place] [phone] Office- [phone], Edwena Felty D 07/05/2018, 4:52 PM

## 2018-07-05 NOTE — Care Management Note (Signed)
Case Management Note  Patient Details  Name: [name] MRN: [id number] Date of Birth: April 14, 1951  Subjective/Objective:                  Discharge Readiness Return to top of Chronic Obstructive Pulmonary Disease RRG - [place]  Discharge readiness is indicated by patient meeting Recovery Milestones, including ALL of the following: ? Hemodynamic stability yes ? Mental status at baseline on ventilator and sedated ? No evidence of infection, or outpatient treatment planned not at this time ? Eating and sleeping without frequent dyspnea not at this time ? Breathing comfortably at rest n/a ? Ambulatory n/a ? Oral hydration, medications, and diet ? Npo/IV NS AT KVO, IV KCL,IV TPN ? LEVEL OF CARE=ICU   Action/Plan: Following for progression of care and condition. Following for cm needs none present at this time.  Expected Discharge Date:                  Expected Discharge Plan:     In-House Referral:     Discharge planning Services     Post Acute Care Choice:    Choice offered to:     DME Arranged:    DME Agency:     HH Arranged:    HH Agency:     Status of Service:     If discussed at [employer], dates discussed:    Additional Comments:  Leeroy Cha, RN 07/05/2018, 10:13 AM

## 2018-07-06 ENCOUNTER — Inpatient Hospital Stay (HOSPITAL_COMMUNITY): Payer: Medicare Other

## 2018-07-06 LAB — CBC
HEMATOCRIT: 31.8 % — AB (ref 39.0–52.0)
Hemoglobin: 10.3 g/dL — ABNORMAL LOW (ref 13.0–17.0)
MCH: 30.7 pg (ref 26.0–34.0)
MCHC: 32.4 g/dL (ref 30.0–36.0)
MCV: 94.9 fL (ref 80.0–100.0)
Platelets: 216 10*3/uL (ref 150–400)
RBC: 3.35 MIL/uL — ABNORMAL LOW (ref 4.22–5.81)
RDW: 15.2 % (ref 11.5–15.5)
WBC: 8.2 10*3/uL (ref 4.0–10.5)
nRBC: 0 % (ref 0.0–0.2)

## 2018-07-06 LAB — GLUCOSE, CAPILLARY
GLUCOSE-CAPILLARY: 109 mg/dL — AB (ref 70–99)
GLUCOSE-CAPILLARY: 211 mg/dL — AB (ref 70–99)
Glucose-Capillary: 112 mg/dL — ABNORMAL HIGH (ref 70–99)
Glucose-Capillary: 162 mg/dL — ABNORMAL HIGH (ref 70–99)
Glucose-Capillary: 97 mg/dL (ref 70–99)

## 2018-07-06 MED ORDER — TRAMADOL HCL 50 MG PO TABS
50.0000 mg | ORAL_TABLET | Freq: Four times a day (QID) | ORAL | Status: DC | PRN
  Administered 2018-07-06 – 2018-07-12 (×11): 50 mg via ORAL
  Filled 2018-07-06 (×11): qty 1

## 2018-07-06 MED ORDER — BOOST / RESOURCE BREEZE PO LIQD CUSTOM
1.0000 | Freq: Three times a day (TID) | ORAL | Status: DC
  Administered 2018-07-06 – 2018-07-08 (×5): 1 via ORAL

## 2018-07-06 MED ORDER — HYDRALAZINE HCL 20 MG/ML IJ SOLN
10.0000 mg | INTRAMUSCULAR | Status: DC | PRN
  Administered 2018-07-08: 10 mg via INTRAVENOUS
  Filled 2018-07-06: qty 1

## 2018-07-06 MED ORDER — BUPROPION HCL ER (XL) 150 MG PO TB24
150.0000 mg | ORAL_TABLET | Freq: Every morning | ORAL | Status: DC
  Administered 2018-07-06 – 2018-07-12 (×7): 150 mg via ORAL
  Filled 2018-07-06 (×7): qty 1

## 2018-07-06 MED ORDER — ACETAMINOPHEN 325 MG PO TABS
650.0000 mg | ORAL_TABLET | Freq: Four times a day (QID) | ORAL | Status: DC
  Administered 2018-07-06 – 2018-07-12 (×24): 650 mg via ORAL
  Filled 2018-07-06 (×25): qty 2

## 2018-07-06 MED ORDER — METOPROLOL SUCCINATE ER 50 MG PO TB24
50.0000 mg | ORAL_TABLET | Freq: Every morning | ORAL | Status: DC
  Administered 2018-07-06 – 2018-07-12 (×7): 50 mg via ORAL
  Filled 2018-07-06 (×7): qty 1

## 2018-07-06 MED ORDER — FENTANYL CITRATE (PF) 100 MCG/2ML IJ SOLN: 12.5000 ug | INTRAMUSCULAR | Status: DC | PRN

## 2018-07-06 MED ORDER — METHOCARBAMOL 500 MG PO TABS
500.0000 mg | ORAL_TABLET | Freq: Four times a day (QID) | ORAL | Status: DC | PRN
  Administered 2018-07-08 – 2018-07-09 (×2): 500 mg via ORAL
  Filled 2018-07-06 (×2): qty 1

## 2018-07-06 MED ORDER — TRAVASOL 10 % IV SOLN
INTRAVENOUS | Status: AC
  Administered 2018-07-06: 18:00:00 via INTRAVENOUS
  Filled 2018-07-06: qty 936

## 2018-07-06 MED ORDER — AMLODIPINE BESYLATE 10 MG PO TABS
10.0000 mg | ORAL_TABLET | Freq: Every morning | ORAL | Status: DC
  Administered 2018-07-06 – 2018-07-12 (×7): 10 mg via ORAL
  Filled 2018-07-06 (×7): qty 1

## 2018-07-06 NOTE — Progress Notes (Signed)
RT came to administer scheduled breathing treatments. Prior to administration, O2 was turned off; patient had been on O2. At this time, O2 sats on RA 71-73%. Per sitter, patient had returned from procedure. Breathing treatments administered and O2 reapplied to patient. O2 sats currently 99% on 4 L.

## 2018-07-06 NOTE — Progress Notes (Signed)
MBS complete, full report to follow.  Pt presents with mild oropharyngeal dysphagia characterized by decreased oral coordination/bolus cohesion and delay transiting.  Pharyngeal swallow is strong - but pt did have minimal aspiration although silent.  Chin tuck posture did not prevent aspiration, however small single sips protective.  Defer to CCS for diet - ? Clears initiate?  Medicine with puree *applesauce* whole- start and follow with liquids.    Thanks for this consult.    Luanna Salk, [facility] SLP Acute Rehab Services Pager [phone] Office [phone]

## 2018-07-06 NOTE — Progress Notes (Signed)
Patient has been trying to get out of bed several times without calling and he has been messing with his IV lines. MD Lucia Gaskins was notified that RN was going to order Air cabin crew.

## 2018-07-06 NOTE — Progress Notes (Signed)
6 Days Post-Op    CC:  Abdominal pain    Subjective: Confused this a.m.  He has a Actuary with him.  He was reported chewing on his call bell cord last evening.  Having a lot of loose stools.  Also complaining of pain.  He called his significant other while I was there for [place] to talk with her.  We were going to add some Haldol but she said the last time he got antipsychotic drugs he got worse. His last PT consult on 127 they were recommending skilled nursing facility and rolling walker.  Donita his significant other thinks that with home health they could not care for him at home.  Objective: Vital signs in last 24 hours: Temp:  [98.1 F (36.7 C)-98.5 F (36.9 C)] 98.4 F (36.9 C) (01/28 0604) Pulse Rate:  [103-122] 105 (01/28 0604) Resp:  [12-26] 18 (01/27 1343) BP: (152-183)/(72-101) 167/96 (01/28 0604) SpO2:  [93 %-100 %] 99 % (01/28 0604) Weight:  [37.8 kg] 37.8 kg (01/28 0604) Last BM Date: 07/05/18 100 PO 5986 IV 1050 urine Stool x 9 Afebrile, Tachycardic and BP up with IV lopressor/IV Vasotec/IV apresoline Prealbumin is 10, WBC OK this AM WBC 8.2, hemoglobin 10.3/hematocrit 31.8, platelets 216,000. Intake/Output from previous day: 01/27 0701 - 01/28 0700 In: 4986.9 [P.O.:100; I.V.:4582; IV Piggyback:304.9] Out: 1050 [Urine:1050] Intake/Output this shift: No intake/output data recorded.  General appearance: alert, cooperative, no distress and He was not sure where he was but he had the foresight to call his girlfriend while we are there. Resp: clear to auscultation bilaterally GI: Soft no longer distended.  Very sore and tender.  Incision with staples in tact it looks good.  Lab Results:  Recent Labs    07/05/18 0426 07/06/18 0420  WBC 8.9 8.2  HGB 10.6* 10.3*  HCT 32.5* 31.8*  PLT 221 216    BMET Recent Labs    07/04/18 0738 07/05/18 0426  NA 138 141  K 3.2* 3.6  CL 102 101  CO2 30 32  GLUCOSE 117* 163*  BUN 34* 33*  CREATININE 0.67 0.68  CALCIUM  8.2* 8.7*   PT/INR No results for input(s): LABPROT, INR in the last 72 hours.  Recent Labs  Lab 07/01/18 0751 07/05/18 0426  AST 38 38  ALT 55* 34  ALKPHOS 61 130*  BILITOT 0.4 1.2  PROT 5.0* 5.8*  ALBUMIN 2.6* 2.5*     Lipase     Component Value Date/Time   LIPASE 53 (H) 06/13/2018 1924     Medications: . budesonide (PULMICORT) nebulizer solution  0.5 mg Nebulization BID  . Chlorhexidine Gluconate Cloth  6 each Topical Daily  . enalaprilat  1.25 mg Intravenous Q6H  . enoxaparin (LOVENOX) injection  30 mg Subcutaneous Q24H  . hydrocortisone-pramoxine   Rectal QID  . insulin aspart  0-9 Units Subcutaneous Q6H  . ipratropium-albuterol  3 mL Nebulization QID  . mouth rinse  15 mL Mouth Rinse BID  . metoprolol tartrate  2.5 mg Intravenous Q6H  . sodium chloride flush  10-40 mL Intracatheter Q12H    Assessment/Plan Hx Non-small cell lung CA HXTobacco abuse  Acute metabolic encephalopathy/postop delirium -fall/ongoing confusion 11/12. Neg Ct head -Medicine following - sedated and on Vent today Hyponatremia/Hypokalemia/Hypomagnesmia History of alcohol abuse Hypertension  - Lopressor, Norvasc scheduled, Apresoline prn PAD/critical left limb ischemia- non healing leftleft first metatarsal head -Vascular has seen the patient SevereMalnutrition(BMI 16.33) >> 14.88 today - prealbuminimproved from 11.3 ->22.1>> 10.1 Internal hemorrhoids -Seen by  Dr. Thana Farr in April 2019, s/p rubber band ligation -Analapram prn.Outpt follow up Anemia - chronic illness  11.5>> 9.2>>7.4 today  Incarcerated recurrent left inguinal hernia, small bowel obstruction open recurrent left inguinal hernia repair with mesh, Dr. Harlow Asa, 06/18/18, POD# 13 Exploratory laparotomy lysis of adhesions, with 2 sites of closed loop obstructions 06/30/18, Dr. Fredrik Cove III  -Admitted 1/5 for incarcerated recurrent left inguinal hernia with small bowel obstructionw transition LLQ -CTA 1/13  with persistent high grade SBO.Thought to berelated to edema in the incarcerated knuckle of bowel he had - wasseen intraoperatively and appeared clearly viable. -Started on TPN1/14 -Diet advancedto soft on 1/18 -1/20 -Improving clinically. Calorie count ordered -1/21 -Incabdominal pain, distension and nausea -Ct yesterday with SBO pattern with no discrete transition zone. NG tube was placed.  -Patient confused this morning. Removed NG tube and PICC line overnight -Continued abdominal distension, w/ hypoactive BS, small bowel distension up to 7cm on film this AM. Will discuss with Dr. Marlou Starks. Possible Ex Lap given ongoing, unresolved SBO 06/30/18 - pt pulled NG  Out 1/27  -  Modified BA swallow 1/28:  Clears started today with moderate aspiration risk  COPD -Acute respiratory illness post-op- followed by Pulm -CT 1/21 with right middle lobe collapse and small right pleural effusion.  -Pulm re consulted and will follow. Appreciate this.  - Post op ARF - on Vent  - self extubated 1/26 ~1800 hrs on Nasal O2  FEN:NPO;TPNon hold (picc line removed) ID: Preop Ancef; Azithromycin & Rocephin (COPD)1/14- 1/18;  Zosyn 1/22 >>day 2 DVT: Lovenox will restart this Afternoon - labs pending Follow-up with Dr.Gerkin  Plan: Advance his diet, continue TNA, start him on oral medications for pain, resume his preadmission blood pressure medicines and Wellbutrin.  Place him on scheduled pain medications.  Keep you up and awake today.  When to has a son coming in he seems fairly alert right now.  We will resume OT and PT tomorrow.  See how he does with advancing his diet, then get case management involved and starting to plan discharge and post discharge care plans.  LOS: 22 days    Shakyla Nolley 07/06/2018 [phone]

## 2018-07-06 NOTE — Progress Notes (Signed)
SLP phoned and spoke to [facility] regarding MBS results.   She reported undersanding to information provided.  Donita did express concern for pt not being fully upright for meal.  Advised her would reinforce this need.  Will follow up. Luanna Salk, [facility] SLP Acute Rehab Services Pager [phone] Office [phone]

## 2018-07-06 NOTE — Progress Notes (Signed)
Consult PROGRESS NOTE    [name]  PTW:[id number] DOB: 26-Sep-1950 DOA: 06/13/2018 PCP: [name], MD    Brief Narrative:  Mr. [name] is a 68 year old man with PMH of COPD Gold stage 3, pHTN, HTN, history of lung cancer who presented initially on 06/13/18 with nausea, vomiting and abdominal pain due to an SBO from a hernia.  Initially not incarcerated.  He underwent surgery on 06/18/18 for inguinal hernia repair with mesh.  He underwent an exploratory laparotomy on 06/30/18 for worsening abdominal distention and pain found to have SBO with no discrete transition zone.  He had lysis of adhesions at that time.  He was intubated in the ICU since that time.  He self extubated successfully on 07/04/18 and has been maintaining oxygenation on [place], most recently 2L.  [facility] has asked [facility] hospitalist to take over medical consultation for this patient going forward.     Assessment & Plan:   Incarcerated left inguinal hernia s/p repair 06/18/2018 POD 28 Explatory laparotomy 06/30/18 with Dr. Marlou Starks III POD 6 - He continues to be on TPN for ileus - Swallowing evaluation yesterday, advanced to clears by surgery - TPN management by surgery and nutrition - No distention, mild pain with palpation today - Surgical site is clean/dry - [facility] following, reviewed their note - Pain control with tylenol, tramadol, nausea control with Zofran - Continue Zosyn - this will be Day 7/7.  Would d/c after today.  Per Dr. Agustina Caroli note, planned for a 7 day course  Post Op Hypoxia, COPD, acute hypoxemic respiratory failure - Doing well on oxygen, not previously on oxygen prior to admission - He was treated as a COPD exacerbation and completed therapy with steroids, rocephin, azithromycin on 06/26/18. - Duonebs q 4 - Pulmicort nebs - Reviewed pulmonary noted from yesterday - Advise tobacco cessation - he is allergic to Chantix and on Wellbutrin already - Oxygen evaluation with ambulation when he is able  LUL lung cancer -  Reviewed chart - he finished XRT in July of 2019, missed re-staging CT scan.  This was done on 1/13 and showed patchy opacities and pleural effusions.  - Will need oncology follow up on discharge - Dr. Tammi Klippel  Essential hypertension - On home blood pressure medication, amlodipine; metoprolol - BP today is 160s/90s - Hydralazine PRN if he cannot tolerate PO    Protein-calorie malnutrition, severe, h/o ETOH use - Underweight - Nutrition consult - TPN - Prealbumin remains low at 10 - Swallowing evaluation yesterday, he has some dis-coordination of swallow given weakness.  Suggested trial of clears, surgery ordered.     Severe claudication with PAD (peripheral artery disease) s/p stent - Stent on 03/30/18 with Dr. Virgina Jock - Restart plavix based on surgery recommendations - post op anemia noted, this is stable. Given he is post op day 6 and improving, he can likely restart plavix today or tomorrow, pending surgery recommendations - Appointment with Dr. Ma Rings on 2/11 - No pain or change to lower extremities, extremities are warm - He was evaluated by vascular surgery during this hospital stay and he will likely need a bypass, but he should follow up outpatient once he is stronger, as he likely would not tolerate surgery at this time.    Acute metabolic encephalopathy/delirum after first surgery 1/10 - Stable, alert, oriented to person and place today - Monitor - Avoid IV narcotics if possible - He has ativan for anxiety/agitation - monitor closely for sedation if he gets this medication - Zyprexa  has made him agitated, would avoid - 1:1 sitter for safety at this time.  - Thiamine/folate  Hyperglycemia - On SSI  PT/OT when he is able - likely in next 1-2 days   DVT prophylaxis: Lovenox  Code Status:  Full  Disposition Plan: Monitor, restart home meds as able, physical therapy, transition diet and restart PO medications   Consultants:   PCCM  Procedures:   Hernia Repair  1/10  Ex Lap with adhesion lysis 1/22  Antimicrobials:   Zosyn 06/30/18 --> stop after dosing on 07/06/18   Subjective: Lying in bed, he answered some questions.  Reported no pain and that he knew he was in the hospital.  His sitter reports that he will "pick and choose" who he talks to and that he has been conversant with other providers today and up to the bathroom without issue.    Objective: Vitals:   07/05/18 2153 07/06/18 0157 07/06/18 0604 07/06/18 0939  BP: (!) 183/95  (!) 167/96   Pulse: (!) 108  (!) 105 (!) 113  Resp:    20  Temp: 98.5 F (36.9 C)  98.4 F (36.9 C)   TempSrc: Oral  Oral   SpO2: 93% 95% 99% (!) 73%  Weight:   37.8 kg   Height:        Intake/Output Summary (Last 24 hours) at 07/06/2018 1010 Last data filed at 07/06/2018 0924 Gross per 24 hour  Intake 4469.26 ml  Output 1100 ml  Net 3369.26 ml   Filed Weights   06/25/18 1418 06/30/18 1300 07/06/18 0604  Weight: 34.4 kg 36.9 kg 37.8 kg    Examination:  General exam: Appears calm and comfortable, lying in bed, very thin Respiratory system: Respiratory effort normal, no wheezing Cardiovascular system: S1 & S2 heard, RR, mildly tachycardic. No murmurs, rubs, gallops or clicks. No pedal edema.  LE are warm and well perfused Gastrointestinal system: Abdomen is nondistended, soft and nontender. Decreased bowel sounds, midline incision closed with staples Central nervous system: Alert to voice, but falls back asleep. No focal neurological deficits. Skin: No rashes, lesions or ulcers on exposed skin.  Midline abdominal incision Psychiatry: Fatigued, but appropriate when awake.     Data Reviewed:   CBC: Recent Labs  Lab 07/01/18 0751 07/01/18 2124 07/02/18 0442 07/05/18 0426 07/06/18 0420  WBC 7.5  --  8.5 8.9 8.2  NEUTROABS  --   --   --  7.6  --   HGB 7.4* 9.6* 9.8* 10.6* 10.3*  HCT 24.0* 30.1* 31.0* 32.5* 31.8*  MCV 103.0*  --  98.1 95.3 94.9  PLT 316  --  231 221 809   Basic Metabolic  Panel: Recent Labs  Lab 07/01/18 0751 07/02/18 0442 07/03/18 0500 07/04/18 0738 07/05/18 0426  NA 136 141 142 138 141  K 5.0 3.5 2.9* 3.2* 3.6  CL 110 112* 109 102 101  CO2 20* 23 27 30  32  GLUCOSE 133* 182* 122* 117* 163*  BUN 42* 34* 32* 34* 33*  CREATININE 1.03 0.73 0.68 0.67 0.68  CALCIUM 7.9* 8.3* 8.5* 8.2* 8.7*  MG 1.8 1.8 1.9 2.1 2.2  PHOS 4.8* 4.0 3.1 3.0 3.8   GFR: Estimated Creatinine Clearance: 48.6 mL/min (by C-G formula based on SCr of 0.68 mg/dL). Liver Function Tests: Recent Labs  Lab 07/01/18 0751 07/05/18 0426  AST 38 38  ALT 55* 34  ALKPHOS 61 130*  BILITOT 0.4 1.2  PROT 5.0* 5.8*  ALBUMIN 2.6* 2.5*   No results for  input(s): LIPASE, AMYLASE in the last 168 hours. No results for input(s): AMMONIA in the last 168 hours. Coagulation Profile: No results for input(s): INR, PROTIME in the last 168 hours. Cardiac Enzymes: No results for input(s): CKTOTAL, CKMB, CKMBINDEX, TROPONINI in the last 168 hours. BNP (last 3 results) No results for input(s): PROBNP in the last 8760 hours. HbA1C: No results for input(s): HGBA1C in the last 72 hours. CBG: Recent Labs  Lab 07/05/18 0647 07/05/18 1252 07/05/18 1756 07/06/18 0034 07/06/18 0508  GLUCAP 165* 140* 130* 162* 109*   Lipid Profile: Recent Labs    07/03/18 1617 07/05/18 0426  TRIG 35 47   Thyroid Function Tests: No results for input(s): TSH, T4TOTAL, FREET4, T3FREE, THYROIDAB in the last 72 hours. Anemia Panel: No results for input(s): VITAMINB12, FOLATE, FERRITIN, TIBC, IRON, RETICCTPCT in the last 72 hours. Sepsis Labs: No results for input(s): PROCALCITON, LATICACIDVEN in the last 168 hours.  Recent Results (from the past 240 hour(s))  Surgical pcr screen     Status: None   Collection Time: 06/30/18  9:35 AM  Result Value Ref Range Status   MRSA, PCR NEGATIVE NEGATIVE Final   Staphylococcus aureus NEGATIVE NEGATIVE Final    Comment: (NOTE) The Xpert SA Assay (FDA approved for NASAL  specimens in patients 20 years of age and older), is one component of a comprehensive surveillance program. It is not intended to diagnose infection nor to guide or monitor treatment. Performed at [facility], [place] [address] [place], [place] [address]          Radiology Studies: Dg Chest Port 1 View  Result Date: 07/05/2018 CLINICAL DATA:  67 year old male respiratory failure. EXAM: PORTABLE CHEST 1 VIEW COMPARISON:  07/03/2018 chest x-ray.  06/21/2018 chest CT. FINDINGS: Endotracheal tube has been removed. Left PICC line tip distal superior vena cava level unchanged. Nasogastric tube side hole just beyond the gastroesophageal junction. Tip not imaged on the current exam. Bilateral pleural effusions possibly with basilar atelectasis and/or infiltrate. Consolidation right middle lobe and lingula unchanged (as noted on prior CT). Heart size within normal limits.  Calcified aorta. IMPRESSION: 1. Endotracheal tube has been removed. 2. Otherwise no significant change including bilateral pleural effusions possibly with basilar atelectasis and/or infiltrate. Consolidation right middle lobe and lingula unchanged (as noted on prior CT). 3.  Aortic Atherosclerosis (ICD10-I70.0). Electronically Signed   By: Genia Del M.D.   On: 07/05/2018 07:20   Dg Swallowing Func-speech Pathology  Result Date: 07/06/2018 Objective Swallowing Evaluation: Type of Study: MBS-Modified Barium Swallow Study  Patient Details Name: [name] MRN: [id number] Date of Birth: 1950-11-26 Today's Date: 07/06/2018 Time: SLP Start Time (ACUTE ONLY): 0810 -SLP Stop Time (ACUTE ONLY): 0830 SLP Time Calculation (min) (ACUTE ONLY): 20 min Past Medical History: Past Medical History: Diagnosis Date . COPD (chronic obstructive pulmonary disease) ([place])  . Hypertension  . Iliac artery stenosis, right (HCC)   80% Stenosis . 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[place])  . Lower limb ischemia 03/26/2018 . Lung cancer dx'd 12/2014 .  Pancreatitis   x2 stents placed to make patent duct to pancreas . Pulmonary hypertension ([place])  . Severe claudication ([place])  . Status post left foot surgery  . Tobacco abuse  Past Surgical History: Past Surgical History: Procedure Laterality Date . ABDOMINAL AORTOGRAM W/LOWER EXTREMITY N/A 03/30/2018  Procedure: ABDOMINAL AORTOGRAM W/LOWER EXTREMITY;  Surgeon: Nigel Mormon, MD;  Location: [facility];  Service: Cardiovascular;  Laterality: N/A; . COLECTOMY   . COLOSTOMY   .  COLOSTOMY REVERSAL   . EUS N/A 12/14/2014  Procedure: UPPER ENDOSCOPIC ULTRASOUND (EUS) LINEAR;  Surgeon: Milus Banister, MD;  Location: [facility];  Service: Endoscopy;  Laterality: N/A; . FOOT SURGERY Left 2006 . INGUINAL HERNIA REPAIR Left 06/18/2018  Procedure: OPEN RECURRENT LEFT INGUINAL HERNIA REPAIR WITH MESH;  Surgeon: Armandina Gemma, MD;  Location: [facility];  Service: General;  Laterality: Left; . INSERTION OF MESH Left 06/18/2018  Procedure: INSERTION OF MESH;  Surgeon: Armandina Gemma, MD;  Location: [facility];  Service: General;  Laterality: Left; . LAPAROTOMY N/A 06/30/2018  Procedure: EXPLORATORY LAPAROTOMY LYSISIS OF ADHESIONS;  Surgeon: Jovita Kussmaul, MD;  Location: [facility];  Service: General;  Laterality: N/A; . PANCREAS SURGERY  2007 . PERIPHERAL VASCULAR INTERVENTION  03/30/2018  Procedure: PERIPHERAL VASCULAR INTERVENTION;  Surgeon: Nigel Mormon, MD;  Location: [facility];  Service: Cardiovascular;;  Rt Iliac . UPPER GI ENDOSCOPY   . VENTRAL HERNIA REPAIR  1967 HPI: [name] is a 68 yo male adm to [facility] with abdomen pain Has h/o COPD, lung cancer 2016 and 2019 - s/p radiation tx in 2019 (single tx per wife).  Pt also with h/o pancreatitis.  Pt required surgery (bowel obstruction) and was intubated 1/22-1/26/20 when he self extubated.  In addition, he also had NG which he self removed also.  Swallow eval ordered.  Pt denies dysphagia from XRT or prior to admit.  Subjective: pt awake in bed, wants to drink  Assessment / Plan / Recommendation CHL IP CLINICAL IMPRESSIONS 07/06/2018 Clinical Impression Pt presents with mild oropharyngeal dysphagia characterized by decreased oral coordination/bolus cohesion due to weakness and delay transiting, premature spillage.  Pharyngeal swallow is strong but timing slightly impaired resulting in mild silent laryngeal penetration/trace aspiration of thin mixed with secretions.  Chin tuck posture did not prevent aspiration, however small single sips protective.  Defer to CCS for diet - ? Clears initiate?  Medicine with puree *applesauce* whole- start and follow with liquids.  Will follow up briefly for dysphagia management and pt/family education.   SLP Visit Diagnosis Dysphagia, oropharyngeal phase (R13.12) Attention and concentration deficit following -- Frontal lobe and executive function deficit following -- Impact on safety and function Mild aspiration risk   CHL IP TREATMENT RECOMMENDATION 07/06/2018 Treatment Recommendations Therapy as outlined in treatment plan below   Prognosis 07/06/2018 Prognosis for Safe Diet Advancement Good Barriers to Reach Goals Other (Comment) Barriers/Prognosis Comment -- CHL IP DIET RECOMMENDATION 07/06/2018 SLP Diet Recommendations Dysphagia 3 (Mech soft) solids;Thin liquid Liquid Administration via Cup Medication Administration Whole meds with puree Compensations Minimize environmental distractions;Slow rate;Small sips/bites Postural Changes Seated upright at 90 degrees;Remain semi-upright after after feeds/meals (Comment)   CHL IP OTHER RECOMMENDATIONS 07/06/2018 Recommended Consults -- Oral Care Recommendations Oral care BID Other Recommendations Clarify dietary restrictions   CHL IP FOLLOW UP RECOMMENDATIONS 07/06/2018 Follow up Recommendations None   CHL IP FREQUENCY AND DURATION 07/06/2018 Speech Therapy Frequency (ACUTE ONLY) min 1 x/week Treatment Duration 1 week      CHL IP ORAL PHASE 07/06/2018 Oral Phase Impaired Oral - Pudding Teaspoon -- Oral -  Pudding Cup -- Oral - Honey Teaspoon -- Oral - Honey Cup -- Oral - Nectar Teaspoon -- Oral - Nectar Cup Premature spillage Oral - Nectar Straw -- Oral - Thin Teaspoon Premature spillage Oral - Thin Cup Premature spillage Oral - Thin Straw Premature spillage Oral - Puree Reduced posterior propulsion;Weak lingual manipulation;Lingual pumping Oral - Mech Soft -- Oral - Regular Impaired mastication Oral -  Multi-Consistency -- Oral - Pill -- Oral Phase - Comment pharyngeal swallow strong  CHL IP PHARYNGEAL PHASE 07/06/2018 Pharyngeal Phase Impaired Pharyngeal- Pudding Teaspoon -- Pharyngeal -- Pharyngeal- Pudding Cup -- Pharyngeal -- Pharyngeal- Honey Teaspoon -- Pharyngeal -- Pharyngeal- Honey Cup -- Pharyngeal -- Pharyngeal- Nectar Teaspoon -- Pharyngeal -- Pharyngeal- Nectar Cup WFL Pharyngeal -- Pharyngeal- Nectar Straw -- Pharyngeal -- Pharyngeal- Thin Teaspoon WFL Pharyngeal -- Pharyngeal- Thin Cup Penetration/Aspiration during swallow Pharyngeal Material enters airway, passes BELOW cords without attempt by patient to eject out (silent aspiration);Material enters airway, remains ABOVE vocal cords then ejected out Pharyngeal- Thin Straw Delayed swallow initiation-pyriform sinuses;Delayed swallow initiation-vallecula;Penetration/Aspiration during swallow Pharyngeal Material enters airway, passes BELOW cords without attempt by patient to eject out (silent aspiration) Pharyngeal- Puree WFL Pharyngeal -- Pharyngeal- Mechanical Soft WFL Pharyngeal -- Pharyngeal- Regular -- Pharyngeal -- Pharyngeal- Multi-consistency -- Pharyngeal -- Pharyngeal- Pill -- Pharyngeal -- Pharyngeal Comment --  CHL IP CERVICAL ESOPHAGEAL PHASE 07/06/2018 Cervical Esophageal Phase Impaired Pudding Teaspoon -- Pudding Cup -- Honey Teaspoon -- Honey Cup -- Nectar Teaspoon -- Nectar Cup -- Nectar Straw -- Thin Teaspoon -- Thin Cup -- Thin Straw -- Puree -- Mechanical Soft -- Regular -- Multi-consistency -- Pill -- Cervical Esophageal Comment  appearance of prominent cricopharyngeus Macario Golds 07/06/2018, 8:57 AM    Luanna Salk, MS [facility] SLP Acute Rehab Services Pager [phone] Office [phone]                Scheduled Meds: . acetaminophen  650 mg Oral Q6H  . amLODipine  10 mg Oral q morning - 10a  . budesonide (PULMICORT) nebulizer solution  0.5 mg Nebulization BID  . buPROPion  150 mg Oral q morning - 10a  . Chlorhexidine Gluconate Cloth  6 each Topical Daily  . enoxaparin (LOVENOX) injection  30 mg Subcutaneous Q24H  . hydrocortisone-pramoxine   Rectal QID  . insulin aspart  0-9 Units Subcutaneous Q6H  . ipratropium-albuterol  3 mL Nebulization QID  . mouth rinse  15 mL Mouth Rinse BID  . metoprolol succinate  50 mg Oral q morning - 10a  . sodium chloride flush  10-40 mL Intracatheter Q12H   Continuous Infusions: . sodium chloride Stopped (07/02/18 1105)  . piperacillin-tazobactam (ZOSYN)  IV 3.375 g (07/06/18 0932)  . TPN ADULT (ION) 65 mL/hr at 07/05/18 1706  . TPN ADULT (ION)       LOS: 22 days    Time spent: 35 minutes    Gilles Chiquito, MD [facility] If 7PM-7AM, please contact night-coverage [email or web address] 07/06/2018, 10:10 AM

## 2018-07-06 NOTE — Progress Notes (Signed)
PHARMACY - ADULT TOTAL PARENTERAL NUTRITION CONSULT NOTE   Pharmacy Consult for TPN Indication: SBO  Patient Measurements: Height: 5\' 2"  (157.5 cm) Weight: 83 lb 6.4 oz (37.8 kg) IBW/kg (Calculated) : 54.6 TPN AdjBW (KG): 36.9 Body mass index is 15.25 kg/m. Usual Weight: 85-95 kg   ASSESSMENT                                                                                               HPI: 72 yoM with advanced COPD, ischemic bowel disease s/p prior abdominal surgery with colostomy and subsequent reversal, lung cancer s/p radiation, HTN, and tobacco abuse. Presents this admission with SBO due to hernia, now s/p open inguinal hernia repair but CT still shows persistent SBO separate from hernia repair site - likely d/t edema/congestion from prior incarceration.  Central access:  PICC placed 1/14 - was removed 1/21 overnight by pt. PICC placed again on 1/23 TPN start date: 1/14   Significant events:  1/15: made NPO, chg MV, lytes to IV or in TPN 1/17: slight return of bowel function, advancing to clears 1/19 100% 2 meals , 75 % 1 meal and 0% 2 meals recorded. 2 boost/resource breeze supplements charted, KUB ordered by CCS 1/20: multiple BMs reported, still with poor PO intake.  GF reports poor intake at baseline 1/21: PO intake increasing but still minimal and not meeting requirements, great difficulty tolerating PO diet.  Pharmacy discussed with CCS who will continue TPN for now. 1/22: patient with confusion overnight, pulled NG and PICC line out.  Returned to OR for exp lap, LOA, and repair of 2 additional closed loop SBO sites.  TPN off overnight 1/21-1/22. 1/23: PICC line replaced - continue to hold TPN until new bag at 1800 d/t borderline K. Propofol weaned off (1/22-1/23).  1/27: self-extubated 1/26, pulled out IV's, NG today. TPN resumed. Lovenox resumed, reduced to 30mg /day; failed swallow eval> NPO per SLP  Insulin Requirements: 4 units SSI in past 24 hours  Current Nutrition:  TPN  IVF: NS @ 35 mL/hr per surgery stopped 1/27 AM  Today, 07/06/2018:  Glucose (CBG goal 100-150) - range 109 - 163  note low TBW).  Electrolytes: no labs today  Renal - SCr and BUN improved  LFTs -  AST/ALT improved  TGs - WNL (1/22), 47 (1/27)  Prealbumin - 11.3 (1/14), improved to 22.1 (1/20), 10.1 (1/27)  Bowel function: NG pulled out by pt 1/27 AM   NUTRITIONAL GOALS                                                                                             RD recs: (updated 1/27) Kcal:  1500-1700 Protein:  75-95 grams Fluid:  >/= 1.5 L/day  Custom TPN at 75  ml/hr provides: Protein 52 g/l, 94 g/day Dextrose 15%, 270 g/day Lipids 20 g/l, 36 g/day Total  Kcal/day 1652   PLAN                                                                                                                          At 1800 today:   Change TPN formula and increase rate to 75 ml/hr.  Provides 100% of protein and Kcal goals  TPN to contain standard multivitamins and trace element daily.  Electrolytes: standard.  Continue 1:1 Cl: Ac ratio.  Famotidine 20 mg in TPN  IVF stopped 1/27 AM by MD  Continue sensitive SSI, CBGs q6h.   TPN lab panels on Mondays & Thursdays.   F/u ability to advance of diet once pt cleared to take [place], Pharm.D [phone] 07/06/2018 8:13 AM

## 2018-07-06 NOTE — Progress Notes (Signed)
Occupational Therapy Treatment Patient Details Name: [name] MRN: [id number] DOB: 1951/02/13 Today's Date: 07/06/2018    History of present illness   68 yo M  with COPD  presenting 1/5 with abdominal pain, bilateral inguinal hernias with small bowel obstruction. Underwent hernia repair 1/10. Return to surgery 1/22 for EXPLORATORY LAPAROTOMY LYSISIS OF ADHESIONS , Persistent right middle lobe atelectasis/collapse with air bronchograms, bibasilar atelectasis, underlying COPD, and bilateral right greater than left effusions,extubated 1/26.   OT comments  Diarrhea limiting pt this day.    Follow Up Recommendations  SNF;Home health OT;Supervision/Assistance - 24 hour    Equipment Recommendations  None recommended by OT    Recommendations for Other Services      Precautions / Restrictions Precautions Precautions: Fall Precaution Comments: O2,monitor sats and HR       Mobility Bed Mobility Overal bed mobility: Needs Assistance Bed Mobility: Supine to Sit     Supine to sit: Min assist     General bed mobility comments: assist trunk  Transfers Overall transfer level: Needs assistance Equipment used: 1 person hand held assist Transfers: Sit to/from Stand;Stand Pivot Transfers Sit to Stand: Min assist Stand pivot transfers: Min assist            Balance Overall balance assessment: Needs assistance;History of Falls Sitting-balance support: No upper extremity supported;Feet supported Sitting balance-Leahy Scale: Fair     Standing balance support: Bilateral upper extremity supported Standing balance-Leahy Scale: Poor Standing balance comment: stood with UE support                           ADL either performed or assessed with clinical judgement   ADL Overall ADL's : Needs assistance/impaired                         Toilet Transfer: Minimal assistance;BSC;RW;Stand-pivot   Toileting- Clothing Manipulation and Hygiene: Maximal  assistance;Sit to/from stand;Cueing for sequencing;Cueing for safety         General ADL Comments: diarrea limiting pt this day. Upon standing each time pt with diarrhea. Pt left on BSC with sitter.               Cognition Arousal/Alertness: Awake/alert Behavior During Therapy: Flat affect Overall Cognitive Status: No family/caregiver present to determine baseline cognitive functioning                                                     Pertinent Vitals/ Pain       Pain Assessment: No/denies pain     Prior Functioning/Environment              Frequency  Min 2X/week        Progress Toward Goals  OT Goals(current goals can now be found in the care plan section)  Progress towards OT goals: Not progressing toward goals - comment(diarrhea limting pt this day)     Plan Discharge plan remains appropriate       AM-PAC OT "6 Clicks" Daily Activity     Outcome Measure   Help from another person eating meals?: A Little Help from another person taking care of personal grooming?: A Little Help from another person toileting, which includes using toliet, bedpan, or urinal?: A Little Help from another person bathing (including washing, rinsing, drying)?:  A Little Help from another person to put on and taking off regular upper body clothing?: A Little Help from another person to put on and taking off regular lower body clothing?: A Little 6 Click Score: 18    End of Session Equipment Utilized During Treatment: Rolling walker  OT Visit Diagnosis: Unsteadiness on feet (R26.81);Other abnormalities of gait and mobility (R26.89);Repeated falls (R29.6);Muscle weakness (generalized) (M62.81)   Activity Tolerance Patient tolerated treatment well   Patient Left with family/visitor present;Other (comment)(on BSC with sitter)   Nurse Communication Mobility status        Time: [phone] OT Time Calculation (min): 16 min  Charges: OT General  Charges $OT Visit: 1 Visit OT Treatments $Self Care/Home Management : 8-22 mins  [name], [place] [phone] Office- [phone]      [name], Edwena Felty D 07/06/2018, 3:50 PM

## 2018-07-06 NOTE — Progress Notes (Addendum)
Modified Barium Swallow Progress Note  Patient Details  Name: [name] MRN: [id number] Date of Birth: June 29, 1950  Today's Date: 07/06/2018  Modified Barium Swallow completed.  Full report located under Chart Review in the Imaging Section.  Brief recommendations include the following:  Clinical Impression  Pt presents with mild oropharyngeal dysphagia characterized by decreased oral coordination/bolus cohesion due to weakness and delay transiting, premature spillage.  Pharyngeal swallow is strong but timing slightly impaired resulting in mild silent laryngeal penetration/trace aspiration of thin mixed with secretions.  Chin tuck posture did not prevent aspiration, however small single sips protective.  Defer to CCS for diet - ? Clears initiate?  Medicine with puree *applesauce* whole- start and follow with liquids.  Will follow up briefly for dysphagia management and pt/family education.    OF NOTE, PT DID COUGH DURING MBS BUT IT WAS NOT COORELATED TO ASPIRATION AND ALSO WITH SILENT ASPIRATION - THUS MONITORING VITALS, LUNGS SOUNDS, ETC IMPORTANT.     Swallow Evaluation Recommendations       SLP Diet Recommendations: Dysphagia 3 (Mech soft) solids;Thin liquid   Liquid Administration via: Cup   Medication Administration: Whole meds with puree   Supervision: Patient able to self feed;Comment(full supervision initially to assure tolerance)   Compensations: Minimize environmental distractions;Slow rate;Small sips/bites   Postural Changes: Seated upright at 90 degrees;Remain semi-upright after after feeds/meals (Comment)   Oral Care Recommendations: Oral care BID   Other Recommendations: Clarify dietary restrictions  Luanna Salk, [facility] SLP Acute Rehab Services Pager [phone] Office [phone]  Macario Golds 07/06/2018,8:57 AM

## 2018-07-07 DIAGNOSIS — Z789 Other specified health status: Secondary | ICD-10-CM

## 2018-07-07 LAB — CBC
HCT: 29.8 % — ABNORMAL LOW (ref 39.0–52.0)
Hemoglobin: 9.6 g/dL — ABNORMAL LOW (ref 13.0–17.0)
MCH: 31.6 pg (ref 26.0–34.0)
MCHC: 32.2 g/dL (ref 30.0–36.0)
MCV: 98 fL (ref 80.0–100.0)
Platelets: 187 10*3/uL (ref 150–400)
RBC: 3.04 MIL/uL — ABNORMAL LOW (ref 4.22–5.81)
RDW: 15.2 % (ref 11.5–15.5)
WBC: 4.7 10*3/uL (ref 4.0–10.5)
nRBC: 0 % (ref 0.0–0.2)

## 2018-07-07 LAB — HEMOGLOBIN A1C
Hgb A1c MFr Bld: 5.8 % — ABNORMAL HIGH (ref 4.8–5.6)
Mean Plasma Glucose: 119.76 mg/dL

## 2018-07-07 LAB — GLUCOSE, CAPILLARY
GLUCOSE-CAPILLARY: 206 mg/dL — AB (ref 70–99)
Glucose-Capillary: 165 mg/dL — ABNORMAL HIGH (ref 70–99)
Glucose-Capillary: 198 mg/dL — ABNORMAL HIGH (ref 70–99)

## 2018-07-07 LAB — BASIC METABOLIC PANEL
Anion gap: 6 (ref 5–15)
BUN: 26 mg/dL — ABNORMAL HIGH (ref 8–23)
CO2: 31 mmol/L (ref 22–32)
Calcium: 8.3 mg/dL — ABNORMAL LOW (ref 8.9–10.3)
Chloride: 97 mmol/L — ABNORMAL LOW (ref 98–111)
Creatinine, Ser: 0.55 mg/dL — ABNORMAL LOW (ref 0.61–1.24)
GFR calc Af Amer: >60 mL/min (ref >60)
GFR calc non Af Amer: >60 mL/min (ref >60)
Glucose, Bld: 176 mg/dL — ABNORMAL HIGH (ref 70–99)
Potassium: 3.6 mmol/L (ref 3.5–5.1)
Sodium: 134 mmol/L — ABNORMAL LOW (ref 135–145)

## 2018-07-07 LAB — MAGNESIUM: Magnesium: 1.8 mg/dL (ref 1.7–2.4)

## 2018-07-07 LAB — PHOSPHORUS: Phosphorus: 3.5 mg/dL (ref 2.5–4.6)

## 2018-07-07 MED ORDER — CLOPIDOGREL BISULFATE 75 MG PO TABS
75.0000 mg | ORAL_TABLET | Freq: Every day | ORAL | Status: DC
  Administered 2018-07-08 – 2018-07-12 (×5): 75 mg via ORAL
  Filled 2018-07-07 (×5): qty 1

## 2018-07-07 MED ORDER — TRAVASOL 10 % IV SOLN
INTRAVENOUS | Status: AC
  Administered 2018-07-07: 18:00:00 via INTRAVENOUS
  Filled 2018-07-07: qty 499.2

## 2018-07-07 MED ORDER — ADULT MULTIVITAMIN W/MINERALS CH
1.0000 | ORAL_TABLET | Freq: Every day | ORAL | Status: DC
  Administered 2018-07-07 – 2018-07-12 (×6): 1 via ORAL
  Filled 2018-07-07 (×6): qty 1

## 2018-07-07 MED ORDER — FAMOTIDINE 20 MG PO TABS
20.0000 mg | ORAL_TABLET | Freq: Every day | ORAL | Status: DC
  Administered 2018-07-07 – 2018-07-11 (×5): 20 mg via ORAL
  Filled 2018-07-07 (×5): qty 1

## 2018-07-07 MED ORDER — SACCHAROMYCES BOULARDII 250 MG PO CAPS
250.0000 mg | ORAL_CAPSULE | Freq: Two times a day (BID) | ORAL | Status: DC
  Administered 2018-07-07 – 2018-07-12 (×11): 250 mg via ORAL
  Filled 2018-07-07 (×11): qty 1

## 2018-07-07 NOTE — Progress Notes (Signed)
Physical Therapy Treatment Patient Details Name: [name] MRN: [id number] DOB: 12/29/1950 Today's Date: 07/07/2018    History of Present Illness   68 yo M  with COPD  presenting 1/5 with abdominal pain, bilateral inguinal hernias with small bowel obstruction. Underwent hernia repair 1/10. Return to surgery 1/22 for EXPLORATORY LAPAROTOMY LYSISIS OF ADHESIONS , Persistent right middle lobe atelectasis/collapse with air bronchograms, bibasilar atelectasis, underlying COPD, and bilateral right greater than left effusions,extubated 1/26.    PT Comments    Patient seen for mobility progression - good motivation to participate. Min A to min guard throughout session for safety and stability with use of HHA for stability. Patient without LOB or overt instability.  Patient up in chair to promote good lung hygiene at end of session.    Follow Up Recommendations  SNF     Equipment Recommendations  Rolling walker with 5" wheels    Recommendations for Other Services       Precautions / Restrictions Precautions Precautions: Fall Precaution Comments: O2,monitor sats and HR Restrictions Weight Bearing Restrictions: No    Mobility  Bed Mobility Overal bed mobility: Needs Assistance Bed Mobility: Supine to Sit     Supine to sit: Min assist     General bed mobility comments: light Min A to come to EOB  Transfers Overall transfer level: Needs assistance Equipment used: 1 person hand held assist Transfers: Sit to/from [facility] Sit to Stand: Min guard Stand pivot transfers: Min guard       General transfer comment: pateint reaching out for hand for stability; able to power up from bedside and recliner without any physical assist  Ambulation/Gait Ambulation/Gait assistance: Min guard Gait Distance (Feet): (6 feet, then 14 feet) Assistive device: 2 person hand held assist Gait Pattern/deviations: Step-through pattern;Decreased stride length;Trunk flexed Gait  velocity: decreased   General Gait Details: slow steady gait pattern - no LOB, cueing for safety   Stairs             Wheelchair Mobility    Modified Rankin (Stroke Patients Only)       Balance Overall balance assessment: Needs assistance;History of Falls Sitting-balance support: No upper extremity supported;Feet supported Sitting balance-Leahy Scale: Fair     Standing balance support: Bilateral upper extremity supported;During functional activity Standing balance-Leahy Scale: Poor Standing balance comment: stood with UE support                            Cognition Arousal/Alertness: Awake/alert Behavior During Therapy: Flat affect Overall Cognitive Status: No family/caregiver present to determine baseline cognitive functioning Area of Impairment: Following commands;Safety/judgement;Problem solving                       Following Commands: Follows one step commands consistently Safety/Judgement: Decreased awareness of safety   Problem Solving: Slow processing;Decreased initiation;Difficulty sequencing;Requires verbal cues;Requires tactile cues General Comments: very pleasant      Exercises      General Comments        Pertinent Vitals/Pain Pain Assessment: No/denies pain    Home Living                      Prior Function            PT Goals (current goals can now be found in the care plan section) Acute Rehab PT Goals Patient Stated Goal: not stated PT Goal Formulation: With patient Time For Goal  Achievement: 07/19/18 Potential to Achieve Goals: Fair Progress towards PT goals: Progressing toward goals    Frequency    Min 2X/week      PT Plan Current plan remains appropriate    Co-evaluation              AM-PAC PT "6 Clicks" Mobility   Outcome Measure  Help needed turning from your back to your side while in a flat bed without using bedrails?: A Little Help needed moving from lying on your back to  sitting on the side of a flat bed without using bedrails?: A Little Help needed moving to and from a bed to a chair (including a wheelchair)?: A Little Help needed standing up from a chair using your arms (e.g., wheelchair or bedside chair)?: A Little Help needed to walk in hospital room?: A Little Help needed climbing 3-5 steps with a railing? : A Lot 6 Click Score: 17    End of Session Equipment Utilized During Treatment: Gait belt Activity Tolerance: Patient tolerated treatment well Patient left: in chair;with call bell/phone within reach;with chair alarm set;with nursing/sitter in room Nurse Communication: Mobility status PT Visit Diagnosis: Unsteadiness on feet (R26.81)     Time: 9728-2060 PT Time Calculation (min) (ACUTE ONLY): 14 min  Charges:  $Gait Training: 8-22 mins                      Lanney Gins, PT, DPT Supplemental Physical Therapist 07/07/18 1:02 PM Pager: [phone] Office: [phone]

## 2018-07-07 NOTE — Progress Notes (Signed)
PHARMACY - ADULT TOTAL PARENTERAL NUTRITION CONSULT NOTE   Pharmacy Consult for TPN Indication: SBO  Patient Measurements: Height: 5\' 2"  (157.5 cm) Weight: 89 lb 1.6 oz (40.4 kg) IBW/kg (Calculated) : 54.6 TPN AdjBW (KG): 36.9 Body mass index is 16.3 kg/m. 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 Presents this admission with SBO due to hernia, now s/p open inguinal hernia repair but CT still shows persistent SBO separate from hernia repair site - likely d/t edema/congestion from prior incarceration.  Central access:  PICC placed 1/14 - was removed 1/21 overnight by pt. PICC placed again on 1/23 TPN start date: 1/14   Significant events:  1/15: made NPO, chg MV, lytes to IV or in TPN 1/17: slight return of bowel function, advancing to clears 1/19 100% 2 meals , 75 % 1 meal and 0% 2 meals recorded. 2 boost/resource breeze supplements charted, KUB ordered by CCS 1/20: multiple BMs reported, still with poor PO intake.  GF reports poor intake at baseline 1/21: PO intake increasing but still minimal and not meeting requirements, great difficulty tolerating PO diet.  Pharmacy discussed with CCS who will continue TPN for now. 1/22: patient with confusion overnight, pulled NG and PICC line out.  Returned to OR for exp lap, LOA, and repair of 2 additional closed loop SBO sites.  TPN off overnight 1/21-1/22. 1/23: PICC line replaced - continue to hold TPN until new bag at 1800 d/t borderline K. Propofol weaned off (1/22-1/23).  1/27: self-extubated 1/26, pulled out IV's, NG today. TPN resumed. Lovenox resumed, reduced to 30mg /day; failed swallow eval> NPO per SLP 1/28 MBS> clears started w/ moderate aspiration risk 1/29 FLD  Insulin  Requirements: 4 units SSI in past 24 hours  Current Nutrition: TPN  IVF: NS @ 35 mL/hr per surgery stopped 1/27 AM  Today, 07/07/2018:  Glucose (CBG goal 100-150) -CBGs 211 and 165 after new TPN hung - above goal ( note low TBW).  Electrolytes: Na 134, K 3.6, Ca 8.3 CoCa 9.5, phos 3.5 mag 1.8  Renal - SCr and BUN improved  LFTs -  AST/ALT improved  TGs - WNL (1/22), 47 (1/27)  Prealbumin - 11.3 (1/14), improved to 22.1 (1/20), 10.1 (1/27)  Bowel function: NG pulled out by pt 1/27 AM   NUTRITIONAL GOALS  RD recs: (updated 1/27) Kcal:  1500-1700 Protein:  75-95 grams Fluid:  >/= 1.5 L/day  Custom TPN at 75 ml/hr provides: Protein 52 g/l, 94 g/day Dextrose 15%, 270 g/day Lipids 20 g/l, 36 g/day Total  Kcal/day 1652   PLAN                                                                                                                          At 1800 today:  decrease TPN to 40 ml/hr  Electrolytes: standard.  Continue 1:1 Cl: Ac ratio.  MVI to PO 1/29  pepcid to PO 1/29  IVF stopped 1/27 AM by MD  Continue sensitive SSI, CBGs q6h.   TPN lab panels on Mondays & Thursdays.   F/u tolerance of PO, wean TPN  Eudelia Bunch, Pharm.D [phone] 07/07/2018 8:08 AM

## 2018-07-07 NOTE — Progress Notes (Signed)
  Speech Language Pathology Treatment: Dysphagia  Patient Details Name: [name] MRN: [id number] DOB: [birth year] Today's Date: 07/07/2018 Time: 5956-3875 SLP Time Calculation (min) (ACUTE ONLY): 18 min  Assessment / Plan / Recommendation Clinical Impression  Pt intake is poor as he is stating he is not hungry.  He admits to poor po intake if he "didn't do anything" prior to admit.  SLP encouraged pt to eat to help healing.  Congested coughing noted today - which RN states is consistent with yesterday.  Reviewed MBS video loops with patient and sitter to improve comprehension of reasoning for strategies. Pt able to teach back 3 major points re: swallowing precautions initially but uncertain if will recall beyond session. Of note, pt does not recall getting up with PT today - when SLP inquired if he has memory deficits prior to admit, he advised he "just does what he needs to and doesn't think about it".    Pt with congested cough with and without intake and is a SILENT aspirator thus will need to monitor vitals *temperatures, WBC, lung sounds, imaging studies, etc* to determine tolerance.  His aspiration risk is chronic due to his level of COPD - pt reports having asthma since he was a 68 years old.     He did consume graham crackers and Boost Breeze via tsp with this SLP.     No overt indication of aspiration during session - congested cough did not appear to worsen with intake.    Will follow up for consideration for respiratory muscle strength training to help improve laryngeal closure/submental swallow musculature and swallowing.      HPI HPI: [name] is a 68 yo male adm to [facility] with abdomen pain Has h/o COPD, lung cancer 2016 and 2019 - s/p radiation tx in 2019 (single tx per wife).  Pt also with h/o pancreatitis.  Pt required surgery (bowel obstruction) and was intubated 1/22-1/26/20 when he self extubated.  In addition, he also had NG which he self removed also.  Swallow eval ordered.  Pt  denies dysphagia from XRT or prior to admit.  Pt undewent MBS yesterday, today seen to assure po tolerance and review clinical reasoning for compensation strategies.        SLP Plan  Continue with current plan of care       Recommendations  Diet recommendations: Dysphagia 3 (mechanical soft);Thin liquid Liquids provided via: Cup Medication Administration: Whole meds with puree Supervision: Patient able to self feed Compensations: Slow rate;Small sips/bites;Clear throat intermittently Postural Changes and/or Swallow Maneuvers: Seated upright 90 degrees;Upright 30-60 min after meal                Oral Care Recommendations: Oral care BID Follow up Recommendations: Skilled Nursing facility SLP Visit Diagnosis: Dysphagia, oropharyngeal phase (R13.12) Plan: Continue with current plan of care       GO                [name] 07/07/2018, 2:28 PM [name], [name] SLP [place] Pager [phone] Office [phone]

## 2018-07-07 NOTE — Progress Notes (Addendum)
Consult PROGRESS NOTE    [name]  ZSW:[id number] DOB: 30-Aug-1950 DOA: 06/13/2018 PCP: [name], MD    Brief Narrative:  Mr. [name] is a 68 year old man with PMH of COPD Gold stage 3, pHTN, HTN, history of lung cancer who presented initially on 06/13/18 with nausea, vomiting and abdominal pain due to an SBO from a hernia.  Initially not incarcerated.  He underwent surgery on 06/18/18 for inguinal hernia repair with mesh.  He underwent an exploratory laparotomy on 06/30/18 for worsening abdominal distention and pain found to have SBO with no discrete transition zone.  He had lysis of adhesions at that time.  He was intubated in the ICU since that time.  He self extubated successfully on 07/04/18 and has been maintaining oxygenation on [place], most recently 2L.  [facility] has asked [facility] to take over medical consultation for this patient going forward.     Assessment & Plan:   Incarcerated left inguinal hernia s/p repair 06/18/2018 POD 28 Explatory laparotomy 06/30/18 with Dr. Marlou Starks III POD 6 - He continues to be on TPN for ileus which is supplementing his po intake -  advanced to D3 diet by surgery/ Speech - TPN management by surgery and pharm - Surgical site is clean/dry, +BM"s - Pain control with tylenol, tramadol, nausea control with Zofran - zosyn IV dc'd today after 7 days Rx  Post Op Hypoxia, COPD, acute hypoxemic respiratory failure - Doing well on oxygen, not previously on oxygen prior to admission - He was treated as a COPD exacerbation and completed therapy with steroids, rocephin, azithromycin on 06/26/18. - Duonebs q 4 - Pulmicort nebs - Reviewed pulmonary noted from yesterday - Advise tobacco cessation - he is allergic to Chantix and on Wellbutrin already - Oxygen evaluation with ambulation when he is able  LUL lung cancer - Reviewed chart - he finished XRT in July of 2019, missed re-staging CT scan.  This was done on 1/13 and showed patchy opacities and pleural  effusions.  - Will need oncology follow up on discharge - Dr. Tammi Klippel  Essential hypertension - On home blood pressure medication, amlodipine; metoprolol - BP today is 160s/90s - Hydralazine PRN if he cannot tolerate PO    Protein-calorie malnutrition, severe, h/o ETOH use - Underweight - Nutrition consult - TPN - Prealbumin remains low at 10 - D3 diet now, will reconsult      Severe claudication with PAD (peripheral artery disease) s/p stent - Stent on 03/30/18 with Dr. Virgina Jock - Restart plavix tomorrow which will be 7 days postop from 1/22 procedure - post op anemia noted, this is stable - Appointment with Dr. Ma Rings on 2/11 - He was evaluated by vascular surgery during this hospital stay and he will likely need a LE bypass, but he should follow up outpatient once he is stronger, as he likely would not tolerate surgery at this time.    Acute metabolic encephalopathy/delirum after first surgery 1/10 - Stable, alert, oriented to person and place today - Monitor - Avoid IV narcotics if possible - He has ativan for anxiety/agitation - monitor closely for sedation if he gets this medication - Zyprexa has made him agitated, would avoid - 1:1 sitter for safety at this time.  - Thiamine/folate  Hyperglycemia - On SSI  PT/OT when he is able - likely in next 1-2 day  DVT prophylaxis: Lovenox  Code Status:  Full  Disposition Plan: Monitor, restart home meds as able, physical therapy, transition diet and restart PO  medications   Kelly Splinter MD [facility] pgr [phone] 07/07/2018, 4:39 PM   Consultants:   PCCM  Procedures:   Hernia Repair 1/10  Ex Lap with adhesion lysis 1/22  Antimicrobials:   Zosyn 06/30/18 --> stop after dosing on 07/06/18   Subjective: "Significant other " with him today, pt has no complaints.  Approved for D3 diet, having bowel movements now w/o difficutly.    Objective: Vitals:   07/07/18 0756 07/07/18 1000 07/07/18 1141  07/07/18 1333  BP:  (!) 146/88  (!) 139/91  Pulse:  87  88  Resp:    18  Temp:    98.3 F (36.8 C)  TempSrc:    Oral  SpO2: 95%  98% 98%  Weight:      Height:        Intake/Output Summary (Last 24 hours) at 07/07/2018 1630 Last data filed at 07/07/2018 1400 Gross per 24 hour  Intake 3820.73 ml  Output 1000 ml  Net 2820.73 ml   Filed Weights   06/30/18 1300 07/06/18 0604 07/07/18 0500  Weight: 36.9 kg 37.8 kg 40.4 kg    Examination:  General exam: Appears calm and comfortable, lying in bed, very thin Respiratory system: Respiratory effort normal, no wheezing Cardiovascular system: S1 & S2 heard, RR, mildly tachycardic. No murmurs, rubs, gallops or clicks. No pedal edema.  LE are warm and well perfused Gastrointestinal system: Abdomen is nondistended, soft and nontender. Decreased bowel sounds, midline incision closed with staples Central nervous system: Alert to voice, but falls back asleep. No focal neurological deficits. Skin: No rashes, lesions or ulcers on exposed skin.  Midline abdominal incision Psychiatry: Fatigued, but appropriate when awake.     Data Reviewed:   CBC: Recent Labs  Lab 07/01/18 0751 07/01/18 2124 07/02/18 0442 07/05/18 0426 07/06/18 0420 07/07/18 0525  WBC 7.5  --  8.5 8.9 8.2 4.7  NEUTROABS  --   --   --  7.6  --   --   HGB 7.4* 9.6* 9.8* 10.6* 10.3* 9.6*  HCT 24.0* 30.1* 31.0* 32.5* 31.8* 29.8*  MCV 103.0*  --  98.1 95.3 94.9 98.0  PLT 316  --  231 221 216 778   Basic Metabolic Panel: Recent Labs  Lab 07/02/18 0442 07/03/18 0500 07/04/18 0738 07/05/18 0426 07/07/18 0525  NA 141 142 138 141 134*  K 3.5 2.9* 3.2* 3.6 3.6  CL 112* 109 102 101 97*  CO2 23 27 30  32 31  GLUCOSE 182* 122* 117* 163* 176*  BUN 34* 32* 34* 33* 26*  CREATININE 0.73 0.68 0.67 0.68 0.55*  CALCIUM 8.3* 8.5* 8.2* 8.7* 8.3*  MG 1.8 1.9 2.1 2.2 1.8  PHOS 4.0 3.1 3.0 3.8 3.5   GFR: Estimated Creatinine Clearance: 51.9 mL/min (A) (by C-G formula based on  SCr of 0.55 mg/dL (L)). Liver Function Tests: Recent Labs  Lab 07/01/18 0751 07/05/18 0426  AST 38 38  ALT 55* 34  ALKPHOS 61 130*  BILITOT 0.4 1.2  PROT 5.0* 5.8*  ALBUMIN 2.6* 2.5*   No results for input(s): LIPASE, AMYLASE in the last 168 hours. No results for input(s): AMMONIA in the last 168 hours. Coagulation Profile: No results for input(s): INR, PROTIME in the last 168 hours. Cardiac Enzymes: No results for input(s): CKTOTAL, CKMB, CKMBINDEX, TROPONINI in the last 168 hours. BNP (last 3 results) No results for input(s): PROBNP in the last 8760 hours. HbA1C: Recent Labs    07/07/18 0525  HGBA1C 5.8*  CBG: Recent Labs  Lab 07/06/18 1201 07/06/18 1801 07/06/18 2349 07/07/18 0601 07/07/18 1155  GLUCAP 97 112* 211* 165* 206*   Lipid Profile: Recent Labs    07/05/18 0426  TRIG 47   Thyroid Function Tests: No results for input(s): TSH, T4TOTAL, FREET4, T3FREE, THYROIDAB in the last 72 hours. Anemia Panel: No results for input(s): VITAMINB12, FOLATE, FERRITIN, TIBC, IRON, RETICCTPCT in the last 72 hours. Sepsis Labs: No results for input(s): PROCALCITON, LATICACIDVEN in the last 168 hours.  Recent Results (from the past 240 hour(s))  Surgical pcr screen     Status: None   Collection Time: 06/30/18  9:35 AM  Result Value Ref Range Status   MRSA, PCR NEGATIVE NEGATIVE Final   Staphylococcus aureus NEGATIVE NEGATIVE Final    Comment: (NOTE) The Xpert SA Assay (FDA approved for NASAL specimens in patients 2 years of age and older), is one component of a comprehensive surveillance program. It is not intended to diagnose infection nor to guide or monitor treatment. Performed at [facility], [place] [address], [place],  [address]          Radiology Studies: Dg Swallowing Func-speech Pathology  Result Date: 07/06/2018 Objective Swallowing Evaluation: Type of Study: MBS-Modified Barium Swallow Study  Patient Details Name: [name] MRN: [id number] Date of Birth: [birth year] Today's Date: 07/06/2018 Time: SLP Start Time (ACUTE ONLY): 0810 -SLP Stop Time (ACUTE ONLY): 0830 SLP Time Calculation (min) (ACUTE ONLY): 20 min Past Medical History: Past Medical History: Diagnosis Date . COPD (chronic obstructive pulmonary disease) ([place])  . Hypertension  . Iliac artery stenosis, right (HCC)   80% Stenosis . 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[place])  . Lower limb ischemia 03/26/2018 . Lung cancer dx'd 12/2014 . Pancreatitis   x2 stents placed to make patent duct to pancreas . Pulmonary hypertension ([place])  . Severe claudication ([place])  . Status post left foot surgery  . Tobacco abuse  Past Surgical History: Past Surgical History: Procedure Laterality Date . ABDOMINAL AORTOGRAM W/LOWER EXTREMITY N/A 03/30/2018  Procedure: ABDOMINAL AORTOGRAM W/LOWER EXTREMITY;  Surgeon: Nigel Mormon, MD;  Location: [place] [facility];  Service: Cardiovascular;  Laterality: N/A; . COLECTOMY   . COLOSTOMY   . COLOSTOMY REVERSAL   . EUS N/A 12/14/2014  Procedure: UPPER ENDOSCOPIC ULTRASOUND (EUS) LINEAR;  Surgeon: Milus Banister, MD;  Location: [facility];  Service: Endoscopy;  Laterality: N/A; . FOOT SURGERY Left 2006 . INGUINAL HERNIA REPAIR Left 06/18/2018  Procedure: OPEN RECURRENT LEFT INGUINAL HERNIA REPAIR WITH MESH;  Surgeon: Armandina Gemma, MD;  Location: [facility];  Service: General;  Laterality: Left; . INSERTION OF MESH Left 06/18/2018  Procedure: INSERTION OF MESH;  Surgeon: Armandina Gemma, MD;  Location: [facility];  Service: General;  Laterality: Left; . LAPAROTOMY N/A 06/30/2018  Procedure: EXPLORATORY LAPAROTOMY LYSISIS OF ADHESIONS;  Surgeon: Jovita Kussmaul, MD;  Location: [facility];  Service: General;  Laterality: N/A; . PANCREAS SURGERY  2007 . PERIPHERAL VASCULAR INTERVENTION  03/30/2018  Procedure: PERIPHERAL VASCULAR INTERVENTION;  Surgeon: Nigel Mormon, MD;  Location: [place] [facility];  Service: Cardiovascular;;  Rt Iliac . UPPER GI ENDOSCOPY   . VENTRAL  HERNIA REPAIR  1967 HPI: [name] is a 68 yo male adm to [facility] with abdomen pain Has h/o COPD, lung cancer 2016 and 2019 - s/p radiation tx in 2019 (single tx per wife).  Pt also with h/o pancreatitis.  Pt required surgery (bowel obstruction) and was intubated 1/22-1/26/20 when he self extubated.  In addition, he also had NG which he self removed also.  Swallow eval ordered.  Pt denies dysphagia from XRT or prior to admit.  Subjective: pt awake in bed, wants to drink Assessment / Plan / Recommendation CHL IP CLINICAL IMPRESSIONS 07/06/2018 Clinical Impression Pt presents with mild oropharyngeal dysphagia characterized by decreased oral coordination/bolus cohesion due to weakness and delay transiting, premature spillage.  Pharyngeal swallow is strong but timing slightly impaired resulting in mild silent laryngeal penetration/trace aspiration of thin mixed with secretions.  Chin tuck posture did not prevent aspiration, however small single sips protective.  Defer to CCS for diet - ? Clears initiate?  Medicine with puree *applesauce* whole- start and follow with liquids.  Will follow up briefly for dysphagia management and pt/family education.   SLP Visit Diagnosis Dysphagia, oropharyngeal phase (R13.12) Attention and concentration deficit following -- Frontal lobe and executive function deficit following -- Impact on safety and function Mild aspiration risk   CHL IP TREATMENT RECOMMENDATION 07/06/2018 Treatment Recommendations Therapy as outlined in treatment plan below   Prognosis 07/06/2018 Prognosis for Safe Diet Advancement Good Barriers to Reach Goals Other (Comment) Barriers/Prognosis Comment -- CHL IP DIET RECOMMENDATION 07/06/2018 SLP Diet Recommendations Dysphagia 3 (Mech soft) solids;Thin liquid Liquid Administration via Cup Medication Administration Whole meds with puree Compensations Minimize environmental distractions;Slow rate;Small sips/bites Postural Changes Seated upright at 90 degrees;Remain semi-upright after  after feeds/meals (Comment)   CHL IP OTHER RECOMMENDATIONS 07/06/2018 Recommended Consults -- Oral Care Recommendations Oral care BID Other Recommendations Clarify dietary restrictions   CHL IP FOLLOW UP RECOMMENDATIONS 07/06/2018 Follow up Recommendations None   CHL IP FREQUENCY AND DURATION 07/06/2018 Speech Therapy Frequency (ACUTE ONLY) min 1 x/week Treatment Duration 1 week      CHL IP ORAL PHASE 07/06/2018 Oral Phase Impaired Oral - Pudding Teaspoon -- Oral - Pudding Cup -- Oral - Honey Teaspoon -- Oral - Honey Cup -- Oral - Nectar Teaspoon -- Oral - Nectar Cup Premature spillage Oral - Nectar Straw -- Oral - Thin Teaspoon Premature spillage Oral - Thin Cup Premature spillage Oral - Thin Straw Premature spillage Oral - Puree Reduced posterior propulsion;Weak lingual manipulation;Lingual pumping Oral - Mech Soft -- Oral - Regular Impaired mastication Oral - Multi-Consistency -- Oral - Pill -- Oral Phase - Comment pharyngeal swallow strong  CHL IP PHARYNGEAL PHASE 07/06/2018 Pharyngeal Phase Impaired Pharyngeal- Pudding Teaspoon -- Pharyngeal -- Pharyngeal- Pudding Cup -- Pharyngeal -- Pharyngeal- Honey Teaspoon -- Pharyngeal -- Pharyngeal- Honey Cup -- Pharyngeal -- Pharyngeal- Nectar Teaspoon -- Pharyngeal -- Pharyngeal- Nectar Cup WFL Pharyngeal -- Pharyngeal- Nectar Straw -- Pharyngeal -- Pharyngeal- Thin Teaspoon WFL Pharyngeal -- Pharyngeal- Thin Cup Penetration/Aspiration during swallow Pharyngeal Material enters airway, passes BELOW cords without attempt by patient to eject out (silent aspiration);Material enters airway, remains ABOVE vocal cords then ejected out Pharyngeal- Thin Straw Delayed swallow initiation-pyriform sinuses;Delayed swallow initiation-vallecula;Penetration/Aspiration during swallow Pharyngeal Material enters airway, passes BELOW cords without attempt by patient to eject out (silent aspiration) Pharyngeal- Puree WFL Pharyngeal -- Pharyngeal- Mechanical Soft WFL Pharyngeal -- Pharyngeal-  Regular -- Pharyngeal -- Pharyngeal- Multi-consistency -- Pharyngeal -- Pharyngeal- Pill -- Pharyngeal -- Pharyngeal Comment --  CHL IP CERVICAL ESOPHAGEAL PHASE 07/06/2018 Cervical Esophageal Phase Impaired Pudding Teaspoon -- Pudding Cup -- Honey Teaspoon -- Honey Cup -- Nectar Teaspoon -- Nectar Cup -- Nectar Straw -- Thin Teaspoon -- Thin Cup -- Thin Straw -- Puree -- Mechanical Soft -- Regular -- Multi-consistency -- Pill -- Cervical Esophageal Comment appearance of prominent cricopharyngeus Ileana Roup,  Marguerite Olea 07/06/2018, 8:57 AM    Luanna Salk, MS [facility] SLP Acute Rehab Services Pager [phone] Office [phone]                Scheduled Meds: . acetaminophen  650 mg Oral Q6H  . amLODipine  10 mg Oral q morning - 10a  . budesonide (PULMICORT) nebulizer solution  0.5 mg Nebulization BID  . buPROPion  150 mg Oral q morning - 10a  . Chlorhexidine Gluconate Cloth  6 each Topical Daily  . enoxaparin (LOVENOX) injection  30 mg Subcutaneous Q24H  . famotidine  20 mg Oral QHS  . feeding supplement  1 Container Oral TID BM  . hydrocortisone-pramoxine   Rectal QID  . insulin aspart  0-9 Units Subcutaneous Q6H  . ipratropium-albuterol  3 mL Nebulization QID  . mouth rinse  15 mL Mouth Rinse BID  . metoprolol succinate  50 mg Oral q morning - 10a  . multivitamin with minerals  1 tablet Oral Daily  . saccharomyces boulardii  250 mg Oral BID  . sodium chloride flush  10-40 mL Intracatheter Q12H   Continuous Infusions: . sodium chloride Stopped (07/02/18 1105)  . TPN ADULT (ION) 75 mL/hr at 07/06/18 1758  . TPN ADULT (ION)       LOS: 23 days    Time spent: 35 minutes    Sol Blazing, MD [facility] If 7PM-7AM, please contact night-coverage [email or web address] 07/07/2018, 4:30 PM

## 2018-07-07 NOTE — Progress Notes (Signed)
7 Days Post-Op    CC:  SBO incarcerated inguinal hernia  Subjective: [name] seems a little less confused this a.m. [name] still has a Actuary.  Multiple episodes of loose stools.  [name] is tolerating full liquids well.  Abdomen still little tight/distended but [name] has bowel sounds.  Midline incision looks good.  Inguinal hernia site looks good.  Objective: Vital signs in last 24 hours: Temp:  [97.9 F (36.6 C)-98.6 F (37 C)] 98.3 F (36.8 C) (01/29 7782) Pulse Rate:  [83-92] 87 (01/29 1000) Resp:  [16-24] 16 (01/29 0608) BP: (146-172)/(88-90) 146/88 (01/29 1000) SpO2:  [90 %-100 %] 95 % (01/29 0756) Weight:  [40.4 kg] 40.4 kg (01/29 0500) Last BM Date: 07/07/18 1980 PO 1924 IV 1750 urine Stool  X 8 Afebrile, VSS; BP155/88  Intake/Output from previous day: 01/28 0701 - 01/29 0700 In: 4004.1 [P.O.:1980; I.V.:1924; IV Piggyback:100.1] Out: 1750 [Urine:1750] Intake/Output this shift: No intake/output data recorded.  General appearance: alert, cooperative and no distress Resp: clear to auscultation bilaterally GI: Still little tight and distended.  Positive bowel sounds, multiple bowel movements, midline incision and left inguinal hernia incision healing nicely.  Lab Results:  Recent Labs    07/06/18 0420 07/07/18 0525  WBC 8.2 4.7  HGB 10.3* 9.6*  HCT 31.8* 29.8*  PLT 216 187    BMET Recent Labs    07/05/18 0426 07/07/18 0525  NA 141 134*  K 3.6 3.6  CL 101 97*  CO2 32 31  GLUCOSE 163* 176*  BUN 33* 26*  CREATININE 0.68 0.55*  CALCIUM 8.7* 8.3*   PT/INR No results for input(s): LABPROT, INR in the last 72 hours.  Recent Labs  Lab 07/01/18 0751 07/05/18 0426  AST 38 38  ALT 55* 34  ALKPHOS 61 130*  BILITOT 0.4 1.2  PROT 5.0* 5.8*  ALBUMIN 2.6* 2.5*     Lipase     Component Value Date/Time   LIPASE 53 (H) 06/13/2018 1924     Medications: . acetaminophen  650 mg Oral Q6H  . amLODipine  10 mg Oral q morning - 10a  . budesonide (PULMICORT) nebulizer  solution  0.5 mg Nebulization BID  . buPROPion  150 mg Oral q morning - 10a  . Chlorhexidine Gluconate Cloth  6 each Topical Daily  . enoxaparin (LOVENOX) injection  30 mg Subcutaneous Q24H  . famotidine  20 mg Oral QHS  . feeding supplement  1 Container Oral TID BM  . hydrocortisone-pramoxine   Rectal QID  . insulin aspart  0-9 Units Subcutaneous Q6H  . ipratropium-albuterol  3 mL Nebulization QID  . mouth rinse  15 mL Mouth Rinse BID  . metoprolol succinate  50 mg Oral q morning - 10a  . multivitamin with minerals  1 tablet Oral Daily  . saccharomyces boulardii  250 mg Oral BID  . sodium chloride flush  10-40 mL Intracatheter Q12H    Assessment/Plan Hx Non-small cell lung CA HXTobacco abuse  Acute metabolic encephalopathy/postop delirium -fall/ongoing confusion 11/12. Neg Ct head -Medicine following - sedated and on Vent today Hyponatremia/Hypokalemia/Hypomagnesmia History of alcohol abuse Hypertension  - Lopressor, Norvasc scheduled, Apresoline prn PAD/critical left limb ischemia- non healing leftleft first metatarsal head -Vascular has seen the patient SevereMalnutrition(BMI 16.33)>> 14.88 today - prealbuminimproved from 11.3 ->22.1>> 10.1 Internal hemorrhoids -Seen by Dr. Thana Farr in April 2019, s/p rubber band ligation -Analapram prn.Outpt follow up Anemia - chronic illness 11.5>>9.2>>7.4 today  Incarcerated recurrent left inguinal hernia, small bowel obstruction open recurrent left inguinal  hernia repair with mesh, Dr. Harlow Asa, 06/18/18, POD# 19 Exploratory laparotomy lysis of adhesions, with 2 sites of closed loop obstructions 06/30/18, Dr. Fredrik Cove III  POD# 7  -Admitted 1/5 for incarcerated recurrent left inguinal hernia with small bowel obstructionw transition LLQ -CTA 1/13 with persistent high grade SBO.Thought to berelated to edema in the incarcerated knuckle of bowel [name] had - wasseen intraoperatively and appeared clearly viable. -Started  on TPN1/14 -Diet advancedto soft on 1/18 -1/20 -Improving clinically. Calorie count ordered -1/21 -Incabdominal pain, distension and nausea -Ct yesterday with SBO pattern with no discrete transition zone. NG tube was placed.  -Patient confused this morning. Removed NG tube and PICC line overnight -Continued abdominal distension, w/ hypoactive BS, small bowel distension up to 7cm on film this AM. Will discuss with Dr. Marlou Starks. Possible Ex Lap given ongoing, unresolved SBO1/22/20 - pt pulled NG  Out 1/27  -  Modified BA swallow 1/28:  Clears started today with moderate aspiration risk  COPD -Acute respiratory illness post-op- followed by Pulm -CT 1/21 with right middle lobe collapse and small right pleural effusion.  -Pulm re consulted and will follow. Appreciate this. - Post op ARF - on Vent  - self extubated 1/26 ~1800 hrs on Nasal O2  FEN:TNA/full liquids >> advance to a soft diet -D3 ID: Preop Ancef; Azithromycin & Rocephin (COPD)1/14- 1/18; Zosyn 1/22 -1/27 DVT: Lovenoxwill restart this Afternoon - labs pending Follow-up with Dr.Gerkin   Maxie Better: Advance to a D3 diet.  Discontinue IV antibiotics.  Wean TNA, PT and OT.  Begin planning on discharge.  Done needed his living other would like to take him home.  We will need recommendations from medicine on medical issues.  We also need to know from OT and PT whether [name] is stable and safe to go home.  I will ask case management to start looking into home health arrangements and to start discussing with the family.  LOS: 23 days    [name] 07/07/2018 [phone]

## 2018-07-08 DIAGNOSIS — G9341 Metabolic encephalopathy: Secondary | ICD-10-CM

## 2018-07-08 LAB — GLUCOSE, CAPILLARY
GLUCOSE-CAPILLARY: 140 mg/dL — AB (ref 70–99)
Glucose-Capillary: 123 mg/dL — ABNORMAL HIGH (ref 70–99)
Glucose-Capillary: 214 mg/dL — ABNORMAL HIGH (ref 70–99)
Glucose-Capillary: 223 mg/dL — ABNORMAL HIGH (ref 70–99)

## 2018-07-08 LAB — COMPREHENSIVE METABOLIC PANEL
ALT: 33 U/L (ref 0–44)
AST: 39 U/L (ref 15–41)
Albumin: 2.4 g/dL — ABNORMAL LOW (ref 3.5–5.0)
Alkaline Phosphatase: 116 U/L (ref 38–126)
Anion gap: 7 (ref 5–15)
BUN: 24 mg/dL — ABNORMAL HIGH (ref 8–23)
CO2: 30 mmol/L (ref 22–32)
Calcium: 8.4 mg/dL — ABNORMAL LOW (ref 8.9–10.3)
Chloride: 94 mmol/L — ABNORMAL LOW (ref 98–111)
Creatinine, Ser: 0.52 mg/dL — ABNORMAL LOW (ref 0.61–1.24)
GFR calc Af Amer: >60 mL/min (ref >60)
GFR calc non Af Amer: >60 mL/min (ref >60)
Glucose, Bld: 136 mg/dL — ABNORMAL HIGH (ref 70–99)
POTASSIUM: 3.6 mmol/L (ref 3.5–5.1)
Sodium: 131 mmol/L — ABNORMAL LOW (ref 135–145)
Total Bilirubin: 0.6 mg/dL (ref 0.3–1.2)
Total Protein: 5.6 g/dL — ABNORMAL LOW (ref 6.5–8.1)

## 2018-07-08 LAB — MAGNESIUM: Magnesium: 1.5 mg/dL — ABNORMAL LOW (ref 1.7–2.4)

## 2018-07-08 LAB — PHOSPHORUS: Phosphorus: 3.5 mg/dL (ref 2.5–4.6)

## 2018-07-08 MED ORDER — MAGNESIUM SULFATE 4 GM/100ML IV SOLN
4.0000 g | Freq: Once | INTRAVENOUS | Status: AC
  Administered 2018-07-08: 4 g via INTRAVENOUS
  Filled 2018-07-08: qty 100

## 2018-07-08 MED ORDER — ENSURE ENLIVE PO LIQD
237.0000 mL | Freq: Three times a day (TID) | ORAL | Status: DC
  Administered 2018-07-08 – 2018-07-11 (×4): 237 mL via ORAL

## 2018-07-08 MED ORDER — IPRATROPIUM-ALBUTEROL 0.5-2.5 (3) MG/3ML IN SOLN
RESPIRATORY_TRACT | Status: AC
  Administered 2018-07-08: 3 mL via RESPIRATORY_TRACT
  Filled 2018-07-08: qty 3

## 2018-07-08 MED ORDER — TRAVASOL 10 % IV SOLN
INTRAVENOUS | Status: AC
  Administered 2018-07-08: 18:00:00 via INTRAVENOUS
  Filled 2018-07-08: qty 499.2

## 2018-07-08 MED ORDER — ENSURE ENLIVE PO LIQD: 237.0000 mL | Freq: Two times a day (BID) | ORAL | Status: DC

## 2018-07-08 NOTE — Care Management (Signed)
This CM spoke with pt significant other Denita for continued DC planning. Denita continues to want pt to come home with her instead of SNF. [facility] alerted that Speech Therapy to be added to home care and 3in1, and RW should delivered to pt room per [facility] request. Will need to see if pt can be weaned off 02 prior to DC. Marney Doctor RN,BSN [phone]

## 2018-07-08 NOTE — Progress Notes (Signed)
Occupational Therapy Treatment Patient Details Name: [name] MRN: [id number] DOB: 06/03/1951 Today's Date: 07/08/2018    History of present illness   68 yo M  with COPD  presenting 1/5 with abdominal pain, bilateral inguinal hernias with small bowel obstruction. Underwent hernia repair 1/10. Return to surgery 1/22 for EXPLORATORY LAPAROTOMY LYSISIS OF ADHESIONS , Persistent right middle lobe atelectasis/collapse with air bronchograms, bibasilar atelectasis, underlying COPD, and bilateral right greater than left effusions,extubated 1/26.   OT comments  Pt presents supine in bed pleasant and willing to participate in therapy session. Pt performing toileting and LB dressing task during session. Pt requiring overall modA for completing ADL task, use of minA (HHA) for transfers to [facility] and to recliner end of session. Pt on RA during session with SpO2 noted 86% with activity completion; requires seated rest and cues for deep breathing to return to 90%. Feel SNF recommendation remains appropriate at this time. Will continue to follow acutely.   Follow Up Recommendations  SNF;Supervision/Assistance - 24 hour    Equipment Recommendations  None recommended by OT          Precautions / Restrictions Precautions Precautions: Fall Precaution Comments: O2,monitor sats and HR Restrictions Weight Bearing Restrictions: No       Mobility Bed Mobility Overal bed mobility: Needs Assistance Bed Mobility: Supine to Sit     Supine to sit: Min guard;HOB elevated     General bed mobility comments: minguard and cues for safety  Transfers Overall transfer level: Needs assistance Equipment used: 1 person hand held assist Transfers: Sit to/from [facility] Sit to Stand: Min assist Stand pivot transfers: Min assist       General transfer comment: use of HHA for stability; performed x2 sit<>stand from EOB and once from [facility]; stand pivot EOB>BSC and pt then taking few steps from  [facility] to recliner, steadying assist provided throughout    Balance Overall balance assessment: Needs assistance;History of Falls Sitting-balance support: No upper extremity supported;Feet supported Sitting balance-Leahy Scale: Fair     Standing balance support: Bilateral upper extremity supported;During functional activity Standing balance-Leahy Scale: Poor Standing balance comment: stood with UE support                           ADL either performed or assessed with clinical judgement   ADL Overall ADL's : Needs assistance/impaired                 Upper Body Dressing : Minimal assistance;Sitting Upper Body Dressing Details (indicate cue type and reason): donning new gown Lower Body Dressing: Moderate assistance;Sit to/from stand Lower Body Dressing Details (indicate cue type and reason): pt able to doff briefs; assist to thread new briefs over L foot, some assist to advance over hips Toilet Transfer: Minimal assistance;BSC;Stand-pivot Toilet Transfer Details (indicate cue type and reason): use of BSC in room due to incontinent BM upon standing at EOB Toileting- Clothing Manipulation and Hygiene: Sit to/from stand;Moderate assistance;Maximal assistance Toileting - Clothing Manipulation Details (indicate cue type and reason): assist for peri-care after BM; safety sitter present and assisting with task as well     Functional mobility during ADLs: Minimal assistance General ADL Comments: pt on RA this session with SpO2 down to 86% with activity, requires seated rest and cues for deep breathing to return to 90%     Vision       Perception     Praxis      Cognition Arousal/Alertness:  Awake/alert Behavior During Therapy: Flat affect Overall Cognitive Status: No family/caregiver present to determine baseline cognitive functioning Area of Impairment: Following commands;Safety/judgement;Problem solving                       Following Commands: Follows one  step commands consistently Safety/Judgement: Decreased awareness of safety   Problem Solving: Slow processing;Decreased initiation;Difficulty sequencing;Requires verbal cues;Requires tactile cues General Comments: very pleasant; pt with incontinent BM upon standing from EOB and decreased awareness of doing so        Exercises     Shoulder Instructions       General Comments      Pertinent Vitals/ Pain       Pain Assessment: No/denies pain  Home Living                                          Prior Functioning/Environment              Frequency  Min 2X/week        Progress Toward Goals  OT Goals(current goals can now be found in the care plan section)  Progress towards OT goals: Progressing toward goals  Acute Rehab OT Goals Patient Stated Goal: not stated OT Goal Formulation: With patient Time For Goal Achievement: 07/20/18 Potential to Achieve Goals: Good  Plan Discharge plan remains appropriate    Co-evaluation                 AM-PAC OT "6 Clicks" Daily Activity     Outcome Measure   Help from another person eating meals?: A Little Help from another person taking care of personal grooming?: A Little Help from another person toileting, which includes using toliet, bedpan, or urinal?: A Lot Help from another person bathing (including washing, rinsing, drying)?: A Little Help from another person to put on and taking off regular upper body clothing?: A Little Help from another person to put on and taking off regular lower body clothing?: A Lot 6 Click Score: 16    End of Session    OT Visit Diagnosis: Unsteadiness on feet (R26.81);Other abnormalities of gait and mobility (R26.89);Repeated falls (R29.6);Muscle weakness (generalized) (M62.81)   Activity Tolerance Patient tolerated treatment well   Patient Left in chair;with call bell/phone within reach;with nursing/sitter in room(safety sitter present)   Nurse Communication  Mobility status        Time: 7619-5093 OT Time Calculation (min): 31 min  Charges: OT General Charges $OT Visit: 1 Visit OT Treatments $Self Care/Home Management : 23-37 mins  Lou Cal, OT Supplemental Rehabilitation Services Pager [phone] Office [place] 07/08/2018, 11:23 AM

## 2018-07-08 NOTE — Progress Notes (Signed)
Consult PROGRESS NOTE    [name]  DGL:[id number] DOB: 11-29-1950 DOA: 06/13/2018 PCP: [name], MD    Brief Narrative:  [name] is a 68 year old man with PMH of COPD Gold stage 3, pHTN, HTN, history of lung cancer who presented initially on 06/13/18 with nausea, vomiting and abdominal pain due to an SBO from a hernia.  Initially not incarcerated.  He underwent surgery on 06/18/18 for inguinal hernia repair with mesh.  He underwent an exploratory laparotomy on 06/30/18 for worsening abdominal distention and pain found to have SBO with no discrete transition zone.  He had lysis of adhesions at that time.  He was intubated in the ICU since that time.  He self extubated successfully on 07/04/18 and has been maintaining oxygenation on [place], most recently 2L.  [facility] has asked [facility] hospital[facility] to take over medical consultation for this patient going forward.     Assessment & Plan:   Incarcerated left inguinal hernia s/p repair 06/18/2018 POD 28 Explatory laparotomy 06/30/18 with Dr. Marlou Starks III POD 6 - He continues to be on TPN for ileus which is supplementing his po intake -  advanced to D3 diet by surgery/ Speech - TPN management by surgery and pharm - zosyn IV dc'd yesterday - no further suggestions, will sign off  Post Op Hypoxia, COPD, acute hypoxemic respiratory failure - Doing well on oxygen, not previously on oxygen prior to admission - He was treated as a COPD exacerbation and completed therapy with steroids, rocephin, azithromycin on 06/26/18. - Duonebs q 4 - Pulmicort nebs - Reviewed pulmonary noted from yesterday - Advise tobacco cessation - he is allergic to Chantix and on Wellbutrin already - Oxygen evaluation with ambulation when he is able  LUL lung cancer - Reviewed chart - he finished XRT in July of 2019, missed re-staging CT scan.  This was done on 1/13 and showed patchy opacities and pleural effusions.  - Will need oncology follow up on discharge - Dr.  Tammi Klippel  Essential hypertension - On home blood pressure medication, amlodipine; metoprolol - BP today is 160s/90s - Hydralazine PRN if he cannot tolerate PO    Protein-calorie malnutrition, severe, h/o ETOH use - Underweight - Nutrition consult - TPN - Prealbumin remains low at 10 - D3 diet now, will reconsult      Severe claudication with PAD (peripheral artery disease) s/p stent - Stent on 03/30/18 with Dr. Virgina Jock - Restart plavix tomorrow which will be 7 days postop from 1/22 procedure - post op anemia noted, this is stable - Appointment with Dr. Ma Rings on 2/11 - he will likely need a LE bypass, but he should follow up outpatient once he is stronger, as he likely would not tolerate surgery at this time.    Acute metabolic encephalopathy/delirum after first surgery 1/10 - resolved - Stable, alert, oriented to person and place today  Hyperglycemia - On SSI    Kelly Splinter MD [facility] pgr [phone] 07/08/2018, 4:06 PM   Consultants:   PCCM  Procedures:   Hernia Repair 1/10  Ex Lap with adhesion lysis 1/22  Antimicrobials:   Zosyn 06/30/18 --> stop after dosing on 07/06/18   Subjective: No c/o today  Objective: Vitals:   07/08/18 0904 07/08/18 0937 07/08/18 1216 07/08/18 1346  BP: 136/77   120/79  Pulse: 88   82  Resp:      Temp: 97.7 F (36.5 C)   97.6 F (36.4 C)  TempSrc: Oral   Oral  SpO2: 98% 96% (!) 85% 94%  Weight:      Height:        Intake/Output Summary (Last 24 hours) at 07/08/2018 1606 Last data filed at 07/08/2018 1400 Gross per 24 hour  Intake 1934.71 ml  Output 1050 ml  Net 884.71 ml   Filed Weights   07/06/18 0604 07/07/18 0500 07/08/18 0641  Weight: 37.8 kg 40.4 kg 36.3 kg    Examination:  General exam: Appears calm and comfortable, lying in bed, very thin Respiratory system: Respiratory effort normal, no wheezing Cardiovascular system: S1 & S2 heard, RR, mildly tachycardic. No murmurs, rubs,  gallops or clicks. No pedal edema.  LE are warm and well perfused Gastrointestinal system: Abdomen is nondistended, soft and nontender. Decreased bowel sounds, midline incision closed with staples Central nervous system: Alert to voice, but falls back asleep. No focal neurological deficits. Skin: No rashes, lesions or ulcers on exposed skin.  Midline abdominal incision Psychiatry: Fatigued, but appropriate when awake.     Data Reviewed:   CBC: Recent Labs  Lab 07/01/18 2124 07/02/18 0442 07/05/18 0426 07/06/18 0420 07/07/18 0525  WBC  --  8.5 8.9 8.2 4.7  NEUTROABS  --   --  7.6  --   --   HGB 9.6* 9.8* 10.6* 10.3* 9.6*  HCT 30.1* 31.0* 32.5* 31.8* 29.8*  MCV  --  98.1 95.3 94.9 98.0  PLT  --  231 221 216 562   Basic Metabolic Panel: Recent Labs  Lab 07/03/18 0500 07/04/18 0738 07/05/18 0426 07/07/18 0525 07/08/18 0431  NA 142 138 141 134* 131*  K 2.9* 3.2* 3.6 3.6 3.6  CL 109 102 101 97* 94*  CO2 27 30 32 31 30  GLUCOSE 122* 117* 163* 176* 136*  BUN 32* 34* 33* 26* 24*  CREATININE 0.68 0.67 0.68 0.55* 0.52*  CALCIUM 8.5* 8.2* 8.7* 8.3* 8.4*  MG 1.9 2.1 2.2 1.8 1.5*  PHOS 3.1 3.0 3.8 3.5 3.5   GFR: Estimated Creatinine Clearance: 46.6 mL/min (A) (by C-G formula based on SCr of 0.52 mg/dL (L)). Liver Function Tests: Recent Labs  Lab 07/05/18 0426 07/08/18 0431  AST 38 39  ALT 34 33  ALKPHOS 130* 116  BILITOT 1.2 0.6  PROT 5.8* 5.6*  ALBUMIN 2.5* 2.4*   No results for input(s): LIPASE, AMYLASE in the last 168 hours. No results for input(s): AMMONIA in the last 168 hours. Coagulation Profile: No results for input(s): INR, PROTIME in the last 168 hours. Cardiac Enzymes: No results for input(s): CKTOTAL, CKMB, CKMBINDEX, TROPONINI in the last 168 hours. BNP (last 3 results) No results for input(s): PROBNP in the last 8760 hours. HbA1C: Recent Labs    07/07/18 0525  HGBA1C 5.8*   CBG: Recent Labs  Lab 07/07/18 1155 07/07/18 1713 07/08/18 0003  07/08/18 0643 07/08/18 1229  GLUCAP 206* 198* 123* 140* 223*   Lipid Profile: No results for input(s): CHOL, HDL, LDLCALC, TRIG, CHOLHDL, LDLDIRECT in the last 72 hours. Thyroid Function Tests: No results for input(s): TSH, T4TOTAL, FREET4, T3FREE, THYROIDAB in the last 72 hours. Anemia Panel: No results for input(s): VITAMINB12, FOLATE, FERRITIN, TIBC, IRON, RETICCTPCT in the last 72 hours. Sepsis Labs: No results for input(s): PROCALCITON, LATICACIDVEN in the last 168 hours.  Recent Results (from the past 240 hour(s))  Surgical pcr screen     Status: None   Collection Time: 06/30/18  9:35 AM  Result Value Ref Range Status   MRSA, PCR NEGATIVE NEGATIVE Final   Staphylococcus  aureus NEGATIVE NEGATIVE Final    Comment: (NOTE) The Xpert SA Assay (FDA approved for NASAL specimens in patients 31 years of age and older), is one component of a comprehensive surveillance program. It is not intended to diagnose infection nor to guide or monitor treatment. Performed at [facility], [place] [address], [place], [place] [address]          Radiology Studies: No results found.      Scheduled Meds: . acetaminophen  650 mg Oral Q6H  . amLODipine  10 mg Oral q morning - 10a  . budesonide (PULMICORT) nebulizer solution  0.5 mg Nebulization BID  . buPROPion  150 mg Oral q morning - 10a  . Chlorhexidine Gluconate Cloth  6 each Topical Daily  . clopidogrel  75 mg Oral Daily  . enoxaparin (LOVENOX) injection  30 mg Subcutaneous Q24H  . famotidine  20 mg Oral QHS  . feeding supplement (ENSURE ENLIVE)  237 mL Oral TID BM  . hydrocortisone-pramoxine   Rectal QID  . ipratropium-albuterol  3 mL Nebulization QID  . mouth rinse  15 mL Mouth Rinse BID  . metoprolol succinate  50 mg Oral q morning - 10a  . multivitamin with minerals  1 tablet Oral Daily  . saccharomyces boulardii  250 mg Oral BID  . sodium chloride flush  10-40 mL Intracatheter Q12H   Continuous  Infusions: . sodium chloride Stopped (07/02/18 1105)  . TPN ADULT (ION) 40 mL/hr at 07/08/18 1000  . TPN ADULT (ION)       LOS: 24 days    Time spent: 35 minutes    Sol Blazing, MD [facility] If 7PM-7AM, please contact night-coverage [email or web address] 07/08/2018, 4:06 PM

## 2018-07-08 NOTE — Progress Notes (Addendum)
Nutrition Follow-up  DOCUMENTATION CODES:   Underweight, Severe malnutrition in context of chronic illness  INTERVENTION:    Carnation Instant Breakfast PO TID, each supplement provides 220 kcal and 13 grams of protein.   Ensure Enlive po TID, each supplement provides 350 kcal and 20 grams of protein  Daily weights   MVI daily  Discussed possibility of adding appetite stimulant with surgery as appetite continues to be poor.   NUTRITION DIAGNOSIS:   Severe Malnutrition related to chronic illness(COPD) as evidenced by energy intake < or equal to 75% for > or equal to 1 month, severe fat depletion, severe muscle depletion, percent weight loss.  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 Presents this admission with SBO due to hernia.    1/10- open inguinal hernia repair  1/13- full liquid diet 1/14- TPN started 1/18- diet advanced to soft  1/22- OR for ex lap, LOA, repair closed loop SBO sites x2- PICC pulled  1/23- PICC replaced 1/26- self extubated  1/27- NGT out  1/29- SLP advanced pt to DYS 3 diet with thin liquids  TPN decreased to 40 ml/hr. Calorie count ordered.   Pt continues to to take very little PO. Meal completions charted as 0% for his last eight meals. [place] reports pt drank 75% of his carnation instant breakfast with whole milk this am. RD observed Boost Breeze at bedside 25% completed. Attempted to wake pt to encourage PO intake. Pt unable to stay awake for conversation. Discussed plan with RN. An envelope placed on pt's door.  Will provide CIB and Ensure Enlive to maximize calories and protein. Pt does not like [employer].   Weight noted to decrease from 83 lb on 1/28 to 80 lb today. Will continue to monitor.   Medications reviewed and include: MVI with minerals Labs reviewed: Na 131 (L) Mg 1.5 (L) CBG 97-223  Diet Order:   Diet Order            DIET DYS 3 Room service appropriate? Yes; Fluid consistency: Thin  Diet effective now              EDUCATION NEEDS:   Education needs have been addressed  Skin:  Skin Assessment: Skin Integrity Issues: Skin Integrity Issues:: Incisions Incisions: abdominal (1/22)  Last BM:  1/29  Height:   Ht Readings from Last 1 Encounters:  07/03/18 '5\' 2"'  (1.575 m)    Weight:   Wt Readings from Last 1 Encounters:  07/08/18 36.3 kg    Ideal Body Weight:  53.6 kg  BMI:  Body mass index is 14.64 kg/m.  Estimated Nutritional Needs:   Kcal:  1500-1700 kcal  Protein:  75-95 grams  Fluid:  >/= 1.5 L/day   Mariana Single RD, LDN Clinical Nutrition Pager # - [phone]

## 2018-07-08 NOTE — Progress Notes (Signed)
8 Days Post-Op    CC: abdominal pain  Subjective: Still has a sitter, still has full liquids on the tray.  PO intake less yesterday than day prior.  He says he is pretty unsteady walking.  I took off his O2 and checked it at bedside and he sats ~ 98%.  He was mid breathing treatment, he can move just under 700 on IS at this same time.  He was not on O2, and smoking at home before this admission.  Abdomen is OK, his recorded stools are less,but he cannot remember how many BM's he really had yesterday.  He may be a little less confused this AM.    Objective: Vital signs in last 24 hours: Temp:  [97.7 F (36.5 C)-98.3 F (36.8 C)] 97.7 F (36.5 C) (01/30 0904) Pulse Rate:  [74-88] 88 (01/30 0904) Resp:  [18-20] 20 (01/30 0641) BP: (136-166)/(77-91) 136/77 (01/30 0904) SpO2:  [94 %-98 %] 98 % (01/30 0904) Weight:  [36.3 kg] 36.3 kg (01/30 0641) Last BM Date: 07/07/18 540 PO recorded yesterday 1800 IV 800 urine recorded BM x 4 recorded yesterday down from 8 day before. Afebril,e BP still up some CMP - glucose up 135, Na 131 BUN 24, Mag 1.5, K+ 3.6 Intake/Output from previous day: 01/29 0701 - 01/30 0700 In: 2351.2 [P.O.:540; I.V.:1811.2] Out: 800 [Urine:800] Intake/Output this shift: No intake/output data recorded.  General appearance: alert, cooperative and no distress.  Frail, deconditioned and malnourished Resp: clear to auscultation bilaterally and mid breathing RX, < 700 on IS.  Sats OK on room air for a few min.   GI: softer, not distended tolerating fulls, fewer BM's recorded.  Lab Results:  Recent Labs    07/06/18 0420 07/07/18 0525  WBC 8.2 4.7  HGB 10.3* 9.6*  HCT 31.8* 29.8*  PLT 216 187    BMET Recent Labs    07/07/18 0525 07/08/18 0431  NA 134* 131*  K 3.6 3.6  CL 97* 94*  CO2 31 30  GLUCOSE 176* 136*  BUN 26* 24*  CREATININE 0.55* 0.52*  CALCIUM 8.3* 8.4*   PT/INR No results for input(s): LABPROT, INR in the last 72 hours.  Recent Labs  Lab  07/05/18 0426 07/08/18 0431  AST 38 39  ALT 34 33  ALKPHOS 130* 116  BILITOT 1.2 0.6  PROT 5.8* 5.6*  ALBUMIN 2.5* 2.4*     Lipase     Component Value Date/Time   LIPASE 53 (H) 06/13/2018 1924     Medications: . acetaminophen  650 mg Oral Q6H  . amLODipine  10 mg Oral q morning - 10a  . budesonide (PULMICORT) nebulizer solution  0.5 mg Nebulization BID  . buPROPion  150 mg Oral q morning - 10a  . Chlorhexidine Gluconate Cloth  6 each Topical Daily  . clopidogrel  75 mg Oral Daily  . enoxaparin (LOVENOX) injection  30 mg Subcutaneous Q24H  . famotidine  20 mg Oral QHS  . feeding supplement  1 Container Oral TID BM  . hydrocortisone-pramoxine   Rectal QID  . ipratropium-albuterol  3 mL Nebulization QID  . mouth rinse  15 mL Mouth Rinse BID  . metoprolol succinate  50 mg Oral q morning - 10a  . multivitamin with minerals  1 tablet Oral Daily  . saccharomyces boulardii  250 mg Oral BID  . sodium chloride flush  10-40 mL Intracatheter Q12H   Anti-infectives (From admission, onward)   Start     Dose/Rate Route Frequency Ordered  Stop   06/30/18 1000  piperacillin-tazobactam (ZOSYN) IVPB 3.375 g  Status:  Discontinued     3.375 g 12.5 mL/hr over 240 Minutes Intravenous Every 8 hours 06/30/18 0920 07/07/18 0725   06/22/18 1300  cefTRIAXone (ROCEPHIN) 1 g in sodium chloride 0.9 % 100 mL IVPB     1 g 200 mL/hr over 30 Minutes Intravenous Every 24 hours 06/22/18 1202 06/26/18 1334   06/22/18 1300  azithromycin (ZITHROMAX) 500 mg in sodium chloride 0.9 % 250 mL IVPB     500 mg 250 mL/hr over 60 Minutes Intravenous Every 24 hours 06/22/18 1202 06/26/18 1513   06/18/18 0600  ceFAZolin (ANCEF) IVPB 2g/100 mL premix  Status:  Discontinued     2 g 200 mL/hr over 30 Minutes Intravenous On call to O.R. 06/18/18 0531 06/18/18 1211      Assessment/Plan Hx Non-small cell lung CA HXTobacco abuse  Acute metabolic encephalopathy/postop delirium -fall/ongoing confusion 11/12. Neg Ct  head -Medicine following - sedated and on Vent today Hyponatremia/Hypokalemia/Hypomagnesmia History of alcohol abuse Hypertension- Lopressor, Norvasc scheduled, Apresoline prn PAD/critical left limb ischemia- non healing leftleft first metatarsal head -Vascular has seen the patient SevereMalnutrition(BMI 16.33)>> 14.88 today - prealbuminimproved from 11.3 ->22.1>> 10.1 Internal hemorrhoids -Seen by Dr. Thana Farr in April 2019, s/p rubber band ligation -Analapram prn.Outpt follow up Anemia - chronic illness 11.5>>9.2>>7.4   Incarcerated recurrent left inguinal hernia, small bowel obstruction open recurrent left inguinal hernia repair with mesh, Dr. Harlow Asa, 06/18/18, POD# 19 Exploratory laparotomy lysis of adhesions, with 2 sites of closed loop obstructions 06/30/18, Dr. Fredrik Cove III  POD# 7  -Admitted 1/5 for incarcerated recurrent left inguinal hernia with small bowel obstructionw transition LLQ -CTA 1/13 with persistent high grade SBO.Thought to berelated to edema in the incarcerated knuckle of bowel he had - wasseen intraoperatively and appeared clearly viable. -Started on TPN1/14 -Diet advancedto soft on 1/18 -1/20 -Improving clinically. Calorie count ordered -1/21 -Incabdominal pain, distension and nausea -Ct yesterday with SBO pattern with no discrete transition zone. NG tube was placed.  -Patient confused this morning. Removed NG tube and PICC line overnight -Continued abdominal distension, w/ hypoactive BS, small bowel distension up to 7cm on film this AM. Will discuss with Dr. Marlou Starks. Possible Ex Lap given ongoing, unresolved SBO1/22/20 - pt pulled NG Out 1/27 - Modified BA swallow 1/28: Clears started today with moderate aspiration risk  COPD -Acute respiratory illness post-op- followed by Pulm -CT 1/21 with right middle lobe collapse and small right pleural effusion.  -Pulm re consulted and will follow. Appreciate this. - Post op ARF -  on Vent - self extubated 1/26 ~1800 hrs on Nasal O2  - IS 680 - 07/09/18 -  I told him to aim for 1000  FEN:TNA/soft diet -D3 - calorie count/nurtition to assist ID: Preop Ancef; Azithromycin &Rocephin (COPD)1/14- 1/18; Zosyn 1/22 -1/29 DVT: Lovenox Follow-up with Dr.Gerkin    Plan:  Leave TNA at half strength.  Calorie count and get Nutrition involved in helping with PO intake.  Continue to work on pulmonary status.  I told him when he got the IS up to 1000 I would let him go home.  He is still a huge aspiration risk.  Speech therapy is following closely.  We need to teach family his aspiration risk and strategies to prevent aspiration.  PT notes ongoing recommendation for SNF. Will give him some trials without O2 and record so we know how he does off O2. Recheck labs in AM.  I have  ask them to document behavior so we can decide on stopping Sitters.  LOS: 24 days    Megan Presti 07/08/2018 [phone]

## 2018-07-08 NOTE — Progress Notes (Signed)
PHARMACY - ADULT TOTAL PARENTERAL NUTRITION CONSULT NOTE   Pharmacy Consult for TPN Indication: SBO  Patient Measurements: Height: 5\' 2"  (157.5 cm) Weight: 80 lb 0.4 oz (36.3 kg) IBW/kg (Calculated) : 54.6 TPN AdjBW (KG): 36.9 Body mass index is 14.64 kg/m. 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 Presents this admission with SBO due to hernia, now s/p open inguinal hernia repair but CT still shows persistent SBO separate from hernia repair site - likely d/t edema/congestion from prior incarceration.  Central access:  PICC placed 1/14 - was removed 1/21 overnight by pt. PICC placed again on 1/23 TPN start date: 1/14   Significant events:  1/15: made NPO, chg MV, lytes to IV or in TPN 1/17: slight return of bowel function, advancing to clears 1/19 100% 2 meals , 75 % 1 meal and 0% 2 meals recorded. 2 boost/resource breeze supplements charted, KUB ordered by CCS 1/20: multiple BMs reported, still with poor PO intake.  GF reports poor intake at baseline 1/21: PO intake increasing but still minimal and not meeting requirements, great difficulty tolerating PO diet.  Pharmacy discussed with CCS who will continue TPN for now. 1/22: patient with confusion overnight, pulled NG and PICC line out.  Returned to OR for exp lap, LOA, and repair of 2 additional closed loop SBO sites.  TPN off overnight 1/21-1/22. 1/23: PICC line replaced - continue to hold TPN until new bag at 1800 d/t borderline K. Propofol weaned off (1/22-1/23).  1/27: self-extubated 1/26, pulled out IV's, NG today. TPN resumed. Lovenox resumed, reduced to 30mg /day; failed swallow eval> NPO per SLP 1/28 MBS> clears started w/ moderate aspiration risk 1/29 FLD, cut TPN to  1/2 rate = 40 ml/hr 1/30 pureed diet  Insulin Requirements: 6 units SSI in past 24 hours  Current Nutrition: TPN @ 40 ml/hr; boost/resource breeze TID, pureed diet  IVF: NS @ 35 mL/hr per surgery stopped 1/27 AM  Today, 07/08/2018:  Glucose (CBG goal 100-150) -CBGs < 150 after rate cut in 1/2 last night> DC SSI/CBGs  Electrolytes: Na 131, K 3.6, Ca 8.4 CoCa 9.68, phos 3.5 mag 1.5  Renal - SCr and BUN improved  LFTs -  WNL now  TGs - WNL (1/22), 47 (1/27)  Prealbumin - 11.3 (1/14), 22.1 (1/20), 10.1 (1/27)  Bowel function: NG pulled out by pt 1/27 AM   NUTRITIONAL GOALS  RD recs: (updated 1/27) Kcal:  1500-1700 Protein:  75-95 grams Fluid:  >/= 1.5 L/day  Custom TPN at 75 ml/hr provides: Protein 52 g/l, 94 g/day Dextrose 15%, 270 g/day Lipids 20 g/l, 36 g/day Total  Kcal/day 1652   PLAN                                                                                                                         Now: mag 4 gm IVPB  At 1800 today:  Continue TPN at 40 ml/hr per CCS with calorie count and nutrition consult to improve PO intake  Electrolytes: standard.  Continue 1:1 Cl: Ac ratio.  MVI to PO 1/29  pepcid to PO 1/29  IVF stopped 1/27 AM by MD  DC SSI & CBGs 1/30  TPN lab panels on Mondays & Thursdays.   F/u tolerance of PO, wean TPN  Eudelia Bunch, Pharm.D [phone] 07/08/2018 10:08 AM

## 2018-07-08 NOTE — Progress Notes (Signed)
Physical Therapy Treatment Patient Details Name: [name] MRN: [id number] DOB: 07-Dec-1950 Today's Date: 07/08/2018    History of Present Illness   68 yo M  with COPD  presenting 1/5 with abdominal pain, bilateral inguinal hernias with small bowel obstruction. Underwent hernia repair 1/10. Return to surgery 1/22 for EXPLORATORY LAPAROTOMY LYSISIS OF ADHESIONS , Persistent right middle lobe atelectasis/collapse with air bronchograms, bibasilar atelectasis, underlying COPD, and bilateral right greater than left effusions,extubated 1/26.    PT Comments    Patient making progress towards goals. Patient requiring MinA to min guard throughout session for safety and stability. Patient on 1L O2 via [place] at rest with O2 sats at 90-93%, increased to 2L for hallway ambulation with patient SpO2 dropping to 86% with mobility - quickly returns to >90% with rest and PLB.  Patient to continue to benefit from short term rehab at discharge due to current functional status. PT to continue to follow.    Follow Up Recommendations  SNF     Equipment Recommendations  Rolling walker with 5" wheels    Recommendations for Other Services       Precautions / Restrictions Precautions Precautions: Fall Precaution Comments: O2,monitor sats and HR Restrictions Weight Bearing Restrictions: No    Mobility  Bed Mobility Overal bed mobility: Needs Assistance Bed Mobility: Supine to Sit     Supine to sit: Min guard;HOB elevated     General bed mobility comments: min guard with cueing for safety and sequencing; easily tangles in IV lines  Transfers Overall transfer level: Needs assistance Equipment used: Rolling walker (2 wheeled) Transfers: Sit to/from [facility] Sit to Stand: Min assist;Min guard Stand pivot transfers: Min assist;Min guard       General transfer comment: light min A for transfers primarily for steadying with cueing for safety and  sequencing  Ambulation/Gait Ambulation/Gait assistance: Min guard Gait Distance (Feet): 55 Feet(2 resp with seated rest break to increase SpO2) Assistive device: Rolling walker (2 wheeled) Gait Pattern/deviations: Step-through pattern;Decreased stride length;Trunk flexed Gait velocity: decreased   General Gait Details: slow steady gait pattern; 2L via [place] utilized with patient dropping to 86% - with seated rest break and cueing for PLB able to improve to 90% to walk back to room; HR mid 80's throughout mobility   Stairs             Wheelchair Mobility    Modified Rankin (Stroke Patients Only)       Balance Overall balance assessment: Needs assistance;History of Falls Sitting-balance support: No upper extremity supported;Feet supported Sitting balance-Leahy Scale: Fair     Standing balance support: Bilateral upper extremity supported;During functional activity Standing balance-Leahy Scale: Poor                              Cognition Arousal/Alertness: Awake/alert Behavior During Therapy: Flat affect Overall Cognitive Status: No family/caregiver present to determine baseline cognitive functioning Area of Impairment: Following commands;Safety/judgement;Problem solving                       Following Commands: Follows one step commands consistently Safety/Judgement: Decreased awareness of safety   Problem Solving: Slow processing;Decreased initiation;Difficulty sequencing;Requires verbal cues;Requires tactile cues General Comments: very pleasant and motivated to participate      Exercises      General Comments        Pertinent Vitals/Pain Pain Assessment: No/denies pain    Home Living  Prior Function            PT Goals (current goals can now be found in the care plan section) Acute Rehab PT Goals Patient Stated Goal: not stated PT Goal Formulation: With patient Time For Goal Achievement:  07/19/18 Potential to Achieve Goals: Fair Progress towards PT goals: Progressing toward goals    Frequency    Min 2X/week      PT Plan Current plan remains appropriate    Co-evaluation              AM-PAC PT "6 Clicks" Mobility   Outcome Measure  Help needed turning from your back to your side while in a flat bed without using bedrails?: A Little Help needed moving from lying on your back to sitting on the side of a flat bed without using bedrails?: A Little Help needed moving to and from a bed to a chair (including a wheelchair)?: A Little Help needed standing up from a chair using your arms (e.g., wheelchair or bedside chair)?: A Little Help needed to walk in hospital room?: A Little Help needed climbing 3-5 steps with a railing? : A Lot 6 Click Score: 17    End of Session Equipment Utilized During Treatment: Gait belt;Oxygen Activity Tolerance: Patient tolerated treatment well Patient left: in bed;with call bell/phone within reach;with nursing/sitter in room;with family/visitor present Nurse Communication: Mobility status PT Visit Diagnosis: Unsteadiness on feet (R26.81)     Time: 3552-1747 PT Time Calculation (min) (ACUTE ONLY): 28 min  Charges:  $Gait Training: 8-22 mins $Therapeutic Activity: 8-22 mins                     Lanney Gins, PT, DPT Supplemental Physical Therapist 07/08/18 3:24 PM Pager: [phone] Office: [phone]

## 2018-07-08 NOTE — Plan of Care (Signed)
Plan of care reviewed. 

## 2018-07-09 LAB — GLUCOSE, CAPILLARY: Glucose-Capillary: 138 mg/dL — ABNORMAL HIGH (ref 70–99)

## 2018-07-09 MED ORDER — INSULIN ASPART 100 UNIT/ML ~~LOC~~ SOLN
0.0000 [IU] | Freq: Three times a day (TID) | SUBCUTANEOUS | Status: DC
  Administered 2018-07-09 – 2018-07-11 (×5): 2 [IU] via SUBCUTANEOUS
  Administered 2018-07-12: 1 [IU] via SUBCUTANEOUS

## 2018-07-09 MED ORDER — TRAVASOL 10 % IV SOLN
INTRAVENOUS | Status: DC
  Filled 2018-07-09: qty 499.2

## 2018-07-09 MED ORDER — INSULIN ASPART 100 UNIT/ML ~~LOC~~ SOLN: 0.0000 [IU] | Freq: Every day | SUBCUTANEOUS | Status: DC

## 2018-07-09 NOTE — Plan of Care (Signed)
Plan of care reviewed and discussed with the patient. 

## 2018-07-09 NOTE — Progress Notes (Signed)
Physical Therapy Treatment Patient Details Name: Emmerich Cryer MRN: [id number] DOB: 1950/12/20 Today's Date: 07/09/2018    History of Present Illness   68 yo M  with COPD  presenting 1/5 with abdominal pain, bilateral inguinal hernias with small bowel obstruction. Underwent hernia repair 1/10. Return to surgery 1/22 for EXPLORATORY LAPAROTOMY LYSISIS OF ADHESIONS , Persistent right middle lobe atelectasis/collapse with air bronchograms, bibasilar atelectasis, underlying COPD, and bilateral right greater than left effusions,extubated 1/26.    PT Comments    Patient continues to progress well with mobility - ambulating today with 1L O2 via [place] for greater distances. Does easily fatigue at end of mobility requiring seated rest break. Making good progress towards goals, however does seemingly demonstrate intermittent confusion with lines and tubes. Will follow.     Follow Up Recommendations  SNF     Equipment Recommendations  Rolling walker with 5" wheels    Recommendations for Other Services       Precautions / Restrictions Precautions Precautions: Fall Precaution Comments: O2,monitor sats and HR Restrictions Weight Bearing Restrictions: No    Mobility  Bed Mobility Overal bed mobility: Needs Assistance Bed Mobility: Supine to Sit     Supine to sit: Min guard;HOB elevated     General bed mobility comments: very preoccupied with lines - at times seemingly confused that he is not tangled   Transfers Overall transfer level: Needs assistance Equipment used: Rolling walker (2 wheeled) Transfers: Sit to/from [facility] Sit to Stand: Min guard Stand pivot transfers: Min guard       General transfer comment: min guard throughout for safety  Ambulation/Gait Ambulation/Gait assistance: Min guard Gait Distance (Feet): 150 Feet Assistive device: Rolling walker (2 wheeled) Gait Pattern/deviations: Step-through pattern;Decreased stride length;Trunk  flexed Gait velocity: decreased   General Gait Details: 1L O2 via [place] with SpO2 at 90% throughout; requires seated rest break following mobility   Stairs             Wheelchair Mobility    Modified Rankin (Stroke Patients Only)       Balance Overall balance assessment: Needs assistance;History of Falls Sitting-balance support: No upper extremity supported;Feet supported Sitting balance-Leahy Scale: Fair     Standing balance support: Bilateral upper extremity supported;During functional activity Standing balance-Leahy Scale: Poor                              Cognition Arousal/Alertness: Awake/alert Behavior During Therapy: Flat affect Overall Cognitive Status: No family/caregiver present to determine baseline cognitive functioning Area of Impairment: Following commands;Safety/judgement;Problem solving                       Following Commands: Follows one step commands consistently Safety/Judgement: Decreased awareness of safety   Problem Solving: Slow processing;Decreased initiation;Difficulty sequencing;Requires verbal cues;Requires tactile cues General Comments: very pleasant and motivated to participate      Exercises      General Comments        Pertinent Vitals/Pain Pain Assessment: No/denies pain    Home Living                      Prior Function            PT Goals (current goals can now be found in the care plan section) Acute Rehab PT Goals Patient Stated Goal: not stated PT Goal Formulation: With patient Time For Goal Achievement: 07/19/18 Potential to Achieve Goals:  Fair Progress towards PT goals: Progressing toward goals    Frequency    Min 2X/week      PT Plan Current plan remains appropriate    Co-evaluation              AM-PAC PT "6 Clicks" Mobility   Outcome Measure  Help needed turning from your back to your side while in a flat bed without using bedrails?: A Little Help needed  moving from lying on your back to sitting on the side of a flat bed without using bedrails?: A Little Help needed moving to and from a bed to a chair (including a wheelchair)?: A Little Help needed standing up from a chair using your arms (e.g., wheelchair or bedside chair)?: A Little Help needed to walk in hospital room?: A Little Help needed climbing 3-5 steps with a railing? : A Lot 6 Click Score: 17    End of Session Equipment Utilized During Treatment: Gait belt;Oxygen Activity Tolerance: Patient tolerated treatment well Patient left: in bed;with call bell/phone within reach;with nursing/sitter in room Nurse Communication: Mobility status PT Visit Diagnosis: Unsteadiness on feet (R26.81)     Time: 1450-1510 PT Time Calculation (min) (ACUTE ONLY): 20 min  Charges:  $Gait Training: 8-22 mins                      Lanney Gins, PT, DPT Supplemental Physical Therapist 07/09/18 3:34 PM Pager: [phone] Office: [phone]

## 2018-07-09 NOTE — Progress Notes (Addendum)
9 Days Post-Op    CC: Abdominal pain  Subjective: Patient is mentations a little bit better today.  He still a little bit confused.  He walked about 50 feet yesterday with PT.  O2 sats dropped down into the 80s on room air yesterday.  They are still recommending skilled nursing facility.  His p.o. intake is still fairly poor he is on a calorie count.  He has a D3 diet ordered, but what is on his tray looks like full liquids. I talked with Donita his significant other.  She is a Emergency planning/management officer.  She does not want him to go to a skilled nursing facility.  At this point were going to plan to wean his TNA off.  Continue OT, PT, continue calorie count, continue oxygen, continue neb treatments.  Work on progressing further and aim for discharge home Monday, 07/12/2018.  Objective: Vital signs in last 24 hours: Temp:  [97.6 F (36.4 C)-98.4 F (36.9 C)] 98.4 F (36.9 C) (01/31 0649) Pulse Rate:  [82-88] 83 (01/31 0649) Resp:  [15-16] 15 (01/31 0649) BP: (120-160)/(77-88) 160/88 (01/31 0649) SpO2:  [85 %-98 %] 96 % (01/31 0649) Last BM Date: 07/07/18 183 PO recorded 893 IV 920 urine Stool x 2 Afebrile, VSS No labs today   Intake/Output from previous day: 01/30 0701 - 01/31 0700 In: 893.1 [P.O.:183; I.V.:710.1] Out: 920 [Urine:920] Intake/Output this shift: No intake/output data recorded.  General appearance: alert, cooperative and no distress Resp: clear to auscultation bilaterally GI: Soft, sore, midline incision looks good.  Tolerating diet, bowel movements are down to 2 recorded yesterday.  Lab Results:  Recent Labs    07/07/18 0525  WBC 4.7  HGB 9.6*  HCT 29.8*  PLT 187    BMET Recent Labs    07/07/18 0525 07/08/18 0431  NA 134* 131*  K 3.6 3.6  CL 97* 94*  CO2 31 30  GLUCOSE 176* 136*  BUN 26* 24*  CREATININE 0.55* 0.52*  CALCIUM 8.3* 8.4*   PT/INR No results for input(s): LABPROT, INR in the last 72 hours.  Recent Labs  Lab 07/05/18 0426  07/08/18 0431  AST 38 39  ALT 34 33  ALKPHOS 130* 116  BILITOT 1.2 0.6  PROT 5.8* 5.6*  ALBUMIN 2.5* 2.4*     Lipase     Component Value Date/Time   LIPASE 53 (H) 06/13/2018 1924     Medications: . acetaminophen  650 mg Oral Q6H  . amLODipine  10 mg Oral q morning - 10a  . budesonide (PULMICORT) nebulizer solution  0.5 mg Nebulization BID  . buPROPion  150 mg Oral q morning - 10a  . Chlorhexidine Gluconate Cloth  6 each Topical Daily  . clopidogrel  75 mg Oral Daily  . enoxaparin (LOVENOX) injection  30 mg Subcutaneous Q24H  . famotidine  20 mg Oral QHS  . feeding supplement (ENSURE ENLIVE)  237 mL Oral TID BM  . hydrocortisone-pramoxine   Rectal QID  . ipratropium-albuterol  3 mL Nebulization QID  . mouth rinse  15 mL Mouth Rinse BID  . metoprolol succinate  50 mg Oral q morning - 10a  . multivitamin with minerals  1 tablet Oral Daily  . saccharomyces boulardii  250 mg Oral BID  . sodium chloride flush  10-40 mL Intracatheter Q12H    Assessment/Plan  Hx Non-small cell lung CA HXTobacco abuse  Acute metabolic encephalopathy/postop delirium -fall/ongoing confusion 11/12. Neg Ct head -Medicine following - sedated and on Vent today  Hyponatremia/Hypokalemia/Hypomagnesmia History of alcohol abuse Hypertension- Lopressor, Norvasc scheduled, Apresoline prn PAD/critical left limb ischemia- non healing leftleft first metatarsal head -Vascular has seen the patient SevereMalnutrition(BMI 16.33)>> 14.88 today - prealbuminimproved from 11.3 ->22.1>>10.1 Internal hemorrhoids -Seen by Dr. Thana Farr in April 2019, s/p rubber band ligation -Analapram prn.Outpt follow up Anemia - chronic illness 11.5>>9.2>>7.4   Incarcerated recurrent left inguinal hernia, small bowel obstruction open recurrent left inguinal hernia repair with mesh, Dr. Harlow Asa, 06/18/18, POD# 19 Exploratory laparotomy lysis of adhesions, with 2 sites of closed loop obstructions 06/30/18,  Dr. Fredrik Cove IIIPOD# 7  -Admitted 1/5 for incarcerated recurrent left inguinal hernia with small bowel obstructionw transition LLQ -CTA 1/13 with persistent high grade SBO.Thought to berelated to edema in the incarcerated knuckle of bowel he had - wasseen intraoperatively and appeared clearly viable. -Started on TPN1/14 -Diet advancedto soft on 1/18 -1/20 -Improving clinically. Calorie count ordered -1/21 -Incabdominal pain, distension and nausea -Ct yesterday with SBO pattern with no discrete transition zone. NG tube was placed.  -Patient confused this morning. Removed NG tube and PICC line overnight -Continued abdominal distension, w/ hypoactive BS, small bowel distension up to 7cm on film this AM. Will discuss with Dr. Marlou Starks. Possible Ex Lap given ongoing, unresolved SBO1/22/20 - pt pulled NG Out 1/27 - Modified BA swallow 1/28: Clears started today with moderate aspiration risk  COPD -Acute respiratory illness post-op- followed by Pulm -CT 1/21 with right middle lobe collapse and small right pleural effusion.  -Pulm re consulted and will follow. Appreciate this. - Post op ARF - on Vent - self extubated 1/26 ~1800 hrs on Nasal O2  - IS 680 - 07/09/18 -  I told him to aim for 1000 on IS  FEN:TNA/soft diet-D3 - calorie count/nurtition to assist ID: Preop Ancef; Azithromycin &Rocephin (COPD)1/14- 1/18; Zosyn 1/22 -1/29 DVT: Lovenox Follow-up with Dr.Gerkin    Plan: As noted above, I plan to discontinue his TNA today, monitor PO intake  over the weekend, we could send him home on TNA, but we are going to try him without TNA over the weekend.  Continue therapies over the weekend.  He supposed to have supplies delivered to his room.  We will also set up home O2,, and nebulizer treatments at home.  Labs ordered for Monday, 07/12/2018.    LOS: 25 days    Angely Dietz 07/09/2018 [phone]

## 2018-07-09 NOTE — Progress Notes (Signed)
SATURATION QUALIFICATIONS: (This note is used to comply with regulatory documentation for home oxygen)  Patient Saturations on Room Air at Rest = 88%  Patient Saturations on Room Air while Ambulating = 92%  Patient Saturations on 1 Liters of oxygen while Ambulating = 90%  Please briefly explain why patient needs home oxygen: pt's oxygen saturation seems to go up and down, it's not consistent.

## 2018-07-09 NOTE — Care Management (Signed)
Spoke with PA about pt need for home 02 and nebulizer. Orders received. [facility] made aware of orders. Need desaturation screen done by nursing before 02 can be completed. Marney Doctor RN,BSN [phone]

## 2018-07-09 NOTE — Progress Notes (Signed)
Nutrition Follow-up  DOCUMENTATION CODES:   Underweight, Severe malnutrition in context of chronic illness  INTERVENTION:    Carnation Instant Breakfast PO TID, each supplement provides 220 kcal and 13 grams of protein.   Ensure Enlive po TID, each supplement provides 350 kcal and 20 grams of protein  Daily weights   MVI daily Discussed possibility of adding appetite stimulant with surgery as appetite continues to be poor.   NUTRITION DIAGNOSIS:   Severe Malnutrition related to chronic illness(COPD) as evidenced by energy intake < or equal to 75% for > or equal to 1 month, severe fat depletion, severe muscle depletion, percent weight loss.  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 Presents this admission with SBO due to hernia.    1/10- open inguinal hernia repair  1/13- full liquid diet 1/14- TPN started 1/18- diet advanced to soft 1/22- OR for ex lap, LOA, repair closed loop SBO sites x2- PICC pulled 1/23- PICC replaced 1/26- self extubated 1/27-NGT out 1/29- SLP advanced pt to DYS 3 diet with thin liquids 130-/TPN decreased to 40 ml/hr. Calorie count ordered.   Pt continues to to take very little PO. Meal completions charted as 0% for his last eight meals. [place] reports pt drank 75% of his carnation instant breakfast with whole milk and 25% of Boost Breeze on1/30. Will provide CIB and Ensure Enlive to maximize calories and protein. Pt does not like [employer].   Pt reports that dinner was not delivered to him 1/30 and 0% of breakfast and lunch today.  Sitter in room and reports pt did not order any lunch and has 0% of ONS.   SLP entered room for swallow evaluation at visit and reports hearing pt liked [employer], pt agreed. RD encouraged patient to report any outside food/drink to obtain accurate calorie count. Pt recalls [place] farm and enjoying all vegetables, and stated his favorite foods being ribs, chicken, and steak. Patient informed RD that he ate well at home as long as he was hungry; reporting that he has experienced intermittent appetite for the past couple of years.   Weight noted to decrease from 83 lb on 1/28 to 80 lb today. Will continue to monitor.    Diet Order:   Diet Order            Diet regular Room service appropriate? Yes; Fluid consistency: Thin  Diet effective now              EDUCATION NEEDS:   Education needs have been addressed  Skin:  Skin Assessment: Skin Integrity Issues: Skin Integrity Issues:: Incisions Incisions: abdominal (1/22)  Last BM:  1/31: Type 6 (Brown; Small)  Height:   Ht Readings from Last 1 Encounters:  07/03/18 _0  (1.575 m)    Weight:   Wt Readings from Last 1 Encounters:  07/08/18 36.3 kg    Ideal Body Weight:  53.6 kg  BMI:  Body mass index is 14.64 kg/m.  Estimated Nutritional Needs:   Kcal:  1500-1700 kcal  Protein:  75-95 grams  Fluid:  >/= 1.5 L/day  Lajuan Lines, RD, LDN  After Hours/Weekend Pager: [phone]

## 2018-07-09 NOTE — Progress Notes (Addendum)
PHARMACY - ADULT TOTAL PARENTERAL NUTRITION CONSULT NOTE   Pharmacy Consult for TPN Indication: SBO  Patient Measurements: Height: 5\' 2"  (157.5 cm) Weight: 80 lb 0.4 oz (36.3 kg) IBW/kg (Calculated) : 54.6 TPN AdjBW (KG): 36.9 Body mass index is 14.64 kg/m. 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 Presents this admission with SBO due to hernia, now s/p open inguinal hernia repair but CT still shows persistent SBO separate from hernia repair site - likely d/t edema/congestion from prior incarceration.  Central access:  PICC placed 1/14 - was removed 1/21 overnight by pt. PICC placed again on 1/23 TPN start date: 1/14   Significant events:  1/15: made NPO, chg MV, lytes to IV or in TPN 1/17: slight return of bowel function, advancing to clears 1/19 100% 2 meals , 75 % 1 meal and 0% 2 meals recorded. 2 boost/resource breeze supplements charted, KUB ordered by CCS 1/20: multiple BMs reported, still with poor PO intake.  GF reports poor intake at baseline 1/21: PO intake increasing but still minimal and not meeting requirements, great difficulty tolerating PO diet.  Pharmacy discussed with CCS who will continue TPN for now. 1/22: patient with confusion overnight, pulled NG and PICC line out.  Returned to OR for exp lap, LOA, and repair of 2 additional closed loop SBO sites.  TPN off overnight 1/21-1/22. 1/23: PICC line replaced - continue to hold TPN until new bag at 1800 d/t borderline K. Propofol weaned off (1/22-1/23).  1/27: self-extubated 1/26, pulled out IV's, NG today. TPN resumed. Lovenox resumed, reduced to 30mg /day; failed swallow eval> NPO per SLP 1/28 MBS> clears started w/ moderate aspiration risk 1/29 FLD, cut TPN to  1/2 rate = 40 ml/hr 1/30 pureed diet, keep TPN at 40 ml/hr per CCS, 48 hr calorie count ordered 1/31 taper TPN to off  Insulin Requirements:   Current Nutrition: TPN @ 40 ml/hr; pureed diet, ensure alive TID, carnation instant breakfast TID  IVF: NS @ 35 mL/hr per surgery stopped 1/27 AM  Today, 07/09/2018:  Glucose (CBG goal 100-150) -CBGs < 150 after rate cut in 1/2 on 1/29, SSI/CBGs dc'd 1/30 but CBGs 223 and 214 on pureed diet, oral supplements  Electrolytes: no labs today, mag 4 gm yesterday  Renal - SCr and BUN improved  LFTs -  WNL now  TGs - WNL (1/22), 47 (1/27)  Prealbumin - 11.3 (1/14), 22.1 (1/20), 10.1 (1/27)  Bowel function: NG pulled out by pt 1/27 AM   NUTRITIONAL GOALS  RD recs: (updated 1/27) Kcal:  1500-1700 Protein:  75-95 grams Fluid:  >/= 1.5 L/day  Custom TPN at 75 ml/hr provides: Protein 52 g/l, 94 g/day Dextrose 15%, 270 g/day Lipids 20 g/l, 36 g/day Total  Kcal/day 1652   PLAN                                                                                                                         Wean TPN to off - no insulin in TPN so no need to taper rate DC TPN after today's bag is infused  Add back SSI 1/31 ACHS  DC TPN labs  Pharmacy to sign off  Eudelia Bunch, Pharm.D [phone] 07/09/2018 8:03 AM

## 2018-07-09 NOTE — Progress Notes (Signed)
  Speech Language Pathology Treatment: Dysphagia  Patient Details Name: [name] MRN: [id number] DOB: 1951-03-27 Today's Date: 07/09/2018 Time: 9935-7017 SLP Time Calculation (min) (ACUTE ONLY): 25 min  Assessment / Plan / Recommendation Clinical Impression  Pt reported premorbid was limited, as he did not eat when not feeling well. However, to honor patient's wishes with his ability to modulate food based on dentition, will advance to regular. Pt tolerated PO trial of tangerine and orange juice with straw with no overt s/sx of aspiration.  His vocal quality remained unchanged throughout the session.  Pt coughed x4 but this appeared unrelated to swallow trials. Pt reports not eating because he doesn't like the food and per nurse, requested [employer].   Pt started the [employer] (PRIT) to help improve swallow abilities and cough clearance. He did 11 trials with clinician at 9 cm H20 with moderate to maximum verbal cues to exhale all breath before inhaling with the device and to take a two second break after each.  He was instructed to do this 5x (inhaling) three times per day.     Advanced diet to regular to maximize nutritional intake. He was encouraged to not order specific food (e.g., salad, chicken) due to lack of teeth/dentures.    HPI HPI: [name] is a 68 yo male adm to [facility] with abdomen pain Has h/o COPD, lung cancer 2016 and 2019 - s/p radiation tx in 2019 (single tx per wife).  Pt also with h/o pancreatitis.  Pt required surgery (bowel obstruction) and was intubated 1/22-1/26/20 when he self extubated.  In addition, he also had NG which he self removed also.  Swallow eval ordered.  Pt denies dysphagia from XRT or prior to admit.  Pt undewent MBS 07/06/18 and was seen today seen to determine diet modifications and begin [employer].        SLP Plan  Continue with current plan of care       Recommendations  Diet recommendations:  Regular;Thin liquid Medication Administration: (Followed by puree) Supervision: Patient able to self feed Compensations: Slow rate;Small sips/bites;Clear throat intermittently Postural Changes and/or Swallow Maneuvers: Seated upright 90 degrees;Upright 30-60 min after meal                Oral Care Recommendations: Oral care BID Follow up Recommendations: Skilled Nursing facility SLP Visit Diagnosis: Dysphagia, oropharyngeal phase (R13.12) Plan: Continue with current plan of care       [place] 07/09/2018, 2:41 PM  Jerlyn Ly, B.A.  Graduate Student Clinician

## 2018-07-10 LAB — GLUCOSE, CAPILLARY
GLUCOSE-CAPILLARY: 99 mg/dL (ref 70–99)
GLUCOSE-CAPILLARY: 155 mg/dL — AB (ref 70–99)
Glucose-Capillary: 156 mg/dL — ABNORMAL HIGH (ref 70–99)
Glucose-Capillary: 153 mg/dL — ABNORMAL HIGH (ref 70–99)
Glucose-Capillary: 173 mg/dL — ABNORMAL HIGH (ref 70–99)
Glucose-Capillary: 124 mg/dL — ABNORMAL HIGH (ref 70–99)

## 2018-07-10 MED ORDER — IPRATROPIUM-ALBUTEROL 0.5-2.5 (3) MG/3ML IN SOLN
3.0000 mL | Freq: Three times a day (TID) | RESPIRATORY_TRACT | Status: DC
  Administered 2018-07-10 – 2018-07-12 (×6): 3 mL via RESPIRATORY_TRACT
  Filled 2018-07-10 (×6): qty 3

## 2018-07-10 NOTE — Progress Notes (Signed)
Contacted on call doctor via weblink in regards to expired order for sitter needed for patient safety. Doctor wrote new order for sitter for next 12 hours. Lavonna Monarch, RN

## 2018-07-10 NOTE — Progress Notes (Signed)
10 Days Post-Op   Subjective/Chief Complaint: Complains of soreness but seems to be happy. Tolerating food   Objective: Vital signs in last 24 hours: Temp:  [97.5 F (36.4 C)-98.5 F (36.9 C)] 98.5 F (36.9 C) (02/01 0532) Pulse Rate:  [82-87] 82 (02/01 0532) Resp:  [16-18] 18 (02/01 0532) BP: (129-165)/(70-91) 162/91 (02/01 0532) SpO2:  [87 %-99 %] 94 % (02/01 0532) Weight:  [36.9 kg] 36.9 kg (02/01 0532) Last BM Date: 07/09/18  Intake/Output from previous day: 01/31 0701 - 02/01 0700 In: 1359.9 [P.O.:680; I.V.:679.9] Out: 1452 [Urine:1451; Stool:1] Intake/Output this shift: Total I/O In: 200 [P.O.:120; I.V.:80] Out: 900 [Urine:900]  General appearance: alert and cooperative Resp: clear to auscultation bilaterally Cardio: regular rate and rhythm GI: soft, mild tenderness. incision looks good  Lab Results:  No results for input(s): WBC, HGB, HCT, PLT in the last 72 hours. BMET Recent Labs    07/08/18 0431  NA 131*  K 3.6  CL 94*  CO2 30  GLUCOSE 136*  BUN 24*  CREATININE 0.52*  CALCIUM 8.4*   PT/INR No results for input(s): LABPROT, INR in the last 72 hours. ABG No results for input(s): PHART, HCO3 in the last 72 hours.  Invalid input(s): PCO2, PO2  Studies/Results: No results found.  Anti-infectives: Anti-infectives (From admission, onward)   Start     Dose/Rate Route Frequency Ordered Stop   06/30/18 1000  piperacillin-tazobactam (ZOSYN) IVPB 3.375 g  Status:  Discontinued     3.375 g 12.5 mL/hr over 240 Minutes Intravenous Every 8 hours 06/30/18 0920 07/07/18 0725   06/22/18 1300  cefTRIAXone (ROCEPHIN) 1 g in sodium chloride 0.9 % 100 mL IVPB     1 g 200 mL/hr over 30 Minutes Intravenous Every 24 hours 06/22/18 1202 06/26/18 1334   06/22/18 1300  azithromycin (ZITHROMAX) 500 mg in sodium chloride 0.9 % 250 mL IVPB     500 mg 250 mL/hr over 60 Minutes Intravenous Every 24 hours 06/22/18 1202 06/26/18 1513   06/18/18 0600  ceFAZolin (ANCEF) IVPB  2g/100 mL premix  Status:  Discontinued     2 g 200 mL/hr over 30 Minutes Intravenous On call to O.R. 06/18/18 0531 06/18/18 1211      Assessment/Plan: s/p Procedure(s): EXPLORATORY LAPAROTOMY LYSISIS OF ADHESIONS (N/A) Advance diet  Hx Non-small cell lung CA HXTobacco abuse  Acute metabolic encephalopathy/postop delirium -fall/ongoing confusion 11/12. Neg Ct head -Medicine following - sedated and on Vent today Hyponatremia/Hypokalemia/Hypomagnesmia History of alcohol abuse Hypertension- Lopressor, Norvasc scheduled, Apresoline prn PAD/critical left limb ischemia- non healing leftleft first metatarsal head -Vascular has seen the patient SevereMalnutrition(BMI 16.33)>> 14.88 today - prealbuminimproved from 11.3 ->22.1>>10.1 Internal hemorrhoids -Seen by Dr. Thana Farr in April 2019, s/p rubber band ligation -Analapram prn.Outpt follow up Anemia - chronic illness 11.5>>9.2>>7.4   Incarcerated recurrent left inguinal hernia, small bowel obstruction open recurrent left inguinal hernia repair with mesh, Dr. Harlow Asa, 06/18/18, POD# 19 Exploratory laparotomy lysis of adhesions, with 2 sites of closed loop obstructions 06/30/18, Dr. Fredrik Cove IIIPOD# 7  -Admitted 1/5 for incarcerated recurrent left inguinal hernia with small bowel obstructionw transition LLQ -CTA 1/13 with persistent high grade SBO.Thought to berelated to edema in the incarcerated knuckle of bowel he had - wasseen intraoperatively and appeared clearly viable. -Started on TPN1/14 -Diet advancedto soft on 1/18 -1/20 -Improving clinically. Calorie count ordered -1/21 -Incabdominal pain, distension and nausea -Ct yesterday with SBO pattern with no discrete transition zone. NG tube was placed.  -Patient confused this morning. Removed NG  tube and PICC line overnight -Continued abdominal distension, w/ hypoactive BS, small bowel distension up to 7cm on film this AM. Will discuss with Dr. Marlou Starks.  Possible Ex Lap given ongoing, unresolved SBO1/22/20 - pt pulled NG Out 1/27 - Modified BA swallow 1/28: Clears started today with moderate aspiration risk  COPD -Acute respiratory illness post-op- followed by Pulm -CT 1/21 with right middle lobe collapse and small right pleural effusion.  -Pulm re consulted and will follow. Appreciate this. - Post op ARF - on Vent - self extubated 1/26 ~1800 hrs on Nasal O2 - IS 680 - 07/09/18 - I told him to aim for 1000 on IS  FEN:TNA/soft diet-D3- calorie count/nurtition to assist ID: Preop Ancef; Azithromycin &Rocephin (COPD)1/14- 1/18; Zosyn 1/22 -1/29 DVT: Lovenox Follow-up with Dr.Gerkin   Plan: As noted above, I plan to discontinue his TNA today, monitor PO intake  over the weekend, we could send him home on TNA, but we are going to try him without TNA over the weekend.  Continue therapies over the weekend.  He supposed to have supplies delivered to his room.  We will also set up home O2,, and nebulizer treatments at home.  Labs ordered for Monday, 07/12/2018.  LOS: 26 days    [name] 07/10/2018

## 2018-07-11 LAB — GLUCOSE, CAPILLARY
Glucose-Capillary: 110 mg/dL — ABNORMAL HIGH (ref 70–99)
Glucose-Capillary: 164 mg/dL — ABNORMAL HIGH (ref 70–99)

## 2018-07-11 NOTE — Progress Notes (Signed)
Nutrition Follow-up  DOCUMENTATION CODES:   Underweight, Severe malnutrition in context of chronic illness  INTERVENTION:   -Continue Ensure Enlive po TID, each supplement provides 350 kcal and 20 grams of protein -Encourage PO intakes  NUTRITION DIAGNOSIS:   Severe Malnutrition related to chronic illness(COPD) as evidenced by energy intake < or equal to 75% for > or equal to 1 month, severe fat depletion, severe muscle depletion, percent weight loss.  Ongoing.  GOAL:   Patient will meet greater than or equal to 90% of their needs  Progressing.  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 Presents this admission with SBO due to hernia.   1/10- open inguinal hernia repair  1/13- full liquid diet 1/14- TPN started 1/18- diet advanced to soft 1/22- OR for ex lap, LOA, repair closed loop SBO sites x2- PICC pulled 1/23- PICC replaced 1/26- self extubated 1/27-NGT out 1/29- SLP advanced pt to DYS 3 diet with thin liquids 130-/TPN decreased to 40 ml/hr. Calorie count ordered.  2/1- TPN d/c  PO intakes on 1/31: Breakfast: 234 kcal, 9g protein Lunch: unable to calculate as recorded Dinner: 660 kcal, 20g protein Supplements: 125 kcal, 4.5g protein (Boost Breeze) Total: 1019 kcal, 33.5g protein  PO intakes on 2/1: Breakfast: 120 kcal, 5g protein Lunch: 0% Dinner: 445 kcal, 17g protein  Supplements: 350 kcal, 20g protein Total: 915 kcal, 42g protein  Today pt had McDonald's for breakfast, appetite has improved but pt is not meeting nutritional needs. Declined supplement as had a lot to eat for breakfast. If patient consumed more nutritional supplements he would be closer to meeting needs but reports trouble with a lot of volume at one time given  prolonged poor PO intake.  Patient has not been receiving [employer]. These were removed as they caused pt to miss meals in [facility] system (uncertain as to why this is occurring).   Medications: Multivitamin with minerals daily, Florastor capsule BID Labs reviewed: CBGs: 110-164  Diet Order:   Diet Order            Diet regular Room service appropriate? Yes; Fluid consistency: Thin  Diet effective now              EDUCATION NEEDS:   Education needs have been addressed  Skin:  Skin Assessment: Skin Integrity Issues: Skin Integrity Issues:: Incisions Incisions: abdominal (1/22)  Last BM:  2/1  Height:   Ht Readings from Last 1 Encounters:  07/03/18 5\' 2"  (1.575 m)    Weight:   Wt Readings from Last 1 Encounters:  07/10/18 36.9 kg    Ideal Body Weight:  53.6 kg  BMI:  Body mass index is 14.88 kg/m.  Estimated Nutritional Needs:   Kcal:  1500-1700 kcal  Protein:  75-95 grams  Fluid:  >/= 1.5 L/day  [name], MS, RD, LDN [place] Dietitian Pager: [phone] After Hours Pager: [phone]

## 2018-07-11 NOTE — Progress Notes (Signed)
11 Days Post-Op   Subjective/Chief Complaint: No complaints. Eating [employer]   Objective: Vital signs in last 24 hours: Temp:  [97.5 F (36.4 C)-98.4 F (36.9 C)] 97.5 F (36.4 C) (02/02 0548) Pulse Rate:  [78-85] 80 (02/02 0743) Resp:  [15-18] 18 (02/02 0743) BP: (135-140)/(79-89) 139/89 (02/02 0548) SpO2:  [88 %-94 %] 94 % (02/02 0743) Last BM Date: 07/09/18  Intake/Output from previous day: 02/01 0701 - 02/02 0700 In: 719.3 [P.O.:480; I.V.:239.3] Out: 500 [Urine:500] Intake/Output this shift: Total I/O In: 591 [P.O.:591] Out: 0   General appearance: alert and cooperative Resp: clear to auscultation bilaterally Cardio: regular rate and rhythm GI: soft, nontender. incision looks good  Lab Results:  No results for input(s): WBC, HGB, HCT, PLT in the last 72 hours. BMET No results for input(s): NA, K, CL, CO2, GLUCOSE, BUN, CREATININE, CALCIUM in the last 72 hours. PT/INR No results for input(s): LABPROT, INR in the last 72 hours. ABG No results for input(s): PHART, HCO3 in the last 72 hours.  Invalid input(s): PCO2, PO2  Studies/Results: No results found.  Anti-infectives: Anti-infectives (From admission, onward)   Start     Dose/Rate Route Frequency Ordered Stop   06/30/18 1000  piperacillin-tazobactam (ZOSYN) IVPB 3.375 g  Status:  Discontinued     3.375 g 12.5 mL/hr over 240 Minutes Intravenous Every 8 hours 06/30/18 0920 07/07/18 0725   06/22/18 1300  cefTRIAXone (ROCEPHIN) 1 g in sodium chloride 0.9 % 100 mL IVPB     1 g 200 mL/hr over 30 Minutes Intravenous Every 24 hours 06/22/18 1202 06/26/18 1334   06/22/18 1300  azithromycin (ZITHROMAX) 500 mg in sodium chloride 0.9 % 250 mL IVPB     500 mg 250 mL/hr over 60 Minutes Intravenous Every 24 hours 06/22/18 1202 06/26/18 1513   06/18/18 0600  ceFAZolin (ANCEF) IVPB 2g/100 mL premix  Status:  Discontinued     2 g 200 mL/hr over 30 Minutes Intravenous On call to O.R. 06/18/18 0531 06/18/18 1211       Assessment/Plan: s/p Procedure(s): EXPLORATORY LAPAROTOMY LYSISIS OF ADHESIONS (N/A) Advance diet  Advance diet  Hx Non-small cell lung CA HXTobacco abuse  Acute metabolic encephalopathy/postop delirium -fall/ongoing confusion 11/12. Neg Ct head -Medicine following - sedated and on Vent today Hyponatremia/Hypokalemia/Hypomagnesmia History of alcohol abuse Hypertension- Lopressor, Norvasc scheduled, Apresoline prn PAD/critical left limb ischemia- non healing leftleft first metatarsal head -Vascular has seen the patient SevereMalnutrition(BMI 16.33)>> 14.88 today - prealbuminimproved from 11.3 ->22.1>>10.1 Internal hemorrhoids -Seen by Dr. Thana Farr in April 2019, s/p rubber band ligation -Analapram prn.Outpt follow up Anemia - chronic illness 11.5>>9.2>>7.4   Incarcerated recurrent left inguinal hernia, small bowel obstruction open recurrent left inguinal hernia repair with mesh, Dr. Harlow Asa, 06/18/18, POD# 19 Exploratory laparotomy lysis of adhesions, with 2 sites of closed loop obstructions 06/30/18, Dr. Fredrik Cove IIIPOD# 7  -Admitted 1/5 for incarcerated recurrent left inguinal hernia with small bowel obstructionw transition LLQ -CTA 1/13 with persistent high grade SBO.Thought to berelated to edema in the incarcerated knuckle of bowel he had - wasseen intraoperatively and appeared clearly viable. -Started on TPN1/14 -Diet advancedto soft on 1/18 -1/20 -Improving clinically. Calorie count ordered -1/21 -Incabdominal pain, distension and nausea -Ct yesterday with SBO pattern with no discrete transition zone. NG tube was placed.  -Patient confused this morning. Removed NG tube and PICC line overnight -Continued abdominal distension, w/ hypoactive BS, small bowel distension up to 7cm on film this AM. Will discuss with Dr. Marlou Starks. Possible Ex Lap given  ongoing, unresolved SBO1/22/20 - pt pulled NG Out 1/27 - Modified BA swallow 1/28: Clears  started today with moderate aspiration risk  COPD -Acute respiratory illness post-op- followed by Pulm -CT 1/21 with right middle lobe collapse and small right pleural effusion.  -Pulm re consulted and will follow. Appreciate this. - Post op ARF - on Vent - self extubated 1/26 ~1800 hrs on Nasal O2 - IS 680 - 07/09/18 - I told him to aim for 1000on IS  FEN:TNA/soft diet-D3- calorie count/nurtition to assist ID: Preop Ancef; Azithromycin &Rocephin (COPD)1/14- 1/18; Zosyn 1/22 -1/29 DVT: Lovenox Follow-up with [facility] for discharge tomorrow  LOS: 27 days    [name] 07/11/2018

## 2018-07-12 ENCOUNTER — Ambulatory Visit: Payer: Self-pay | Admitting: Cardiology

## 2018-07-12 LAB — GLUCOSE, CAPILLARY
GLUCOSE-CAPILLARY: 172 mg/dL — AB (ref 70–99)
Glucose-Capillary: 115 mg/dL — ABNORMAL HIGH (ref 70–99)
Glucose-Capillary: 122 mg/dL — ABNORMAL HIGH (ref 70–99)

## 2018-07-12 MED ORDER — SACCHAROMYCES BOULARDII 250 MG PO CAPS: 250.0000 mg | ORAL_CAPSULE | Freq: Two times a day (BID) | ORAL | Status: DC

## 2018-07-12 MED ORDER — IPRATROPIUM-ALBUTEROL 0.5-2.5 (3) MG/3ML IN SOLN: 3.0000 mL | Freq: Three times a day (TID) | RESPIRATORY_TRACT | 0 refills | Status: DC

## 2018-07-12 MED ORDER — ALBUTEROL SULFATE (2.5 MG/3ML) 0.083% IN NEBU: 2.5000 mg | INHALATION_SOLUTION | RESPIRATORY_TRACT | 1 refills | Status: AC | PRN

## 2018-07-12 MED ORDER — ACETAMINOPHEN 325 MG PO TABS: 650.0000 mg | ORAL_TABLET | Freq: Four times a day (QID) | ORAL | Status: DC | PRN

## 2018-07-12 MED ORDER — TRAMADOL HCL 50 MG PO TABS: 50.0000 mg | ORAL_TABLET | Freq: Four times a day (QID) | ORAL | 0 refills | Status: DC | PRN

## 2018-07-12 MED ORDER — HYDROCORTISONE ACE-PRAMOXINE 2.5-1 % RE CREA: TOPICAL_CREAM | Freq: Four times a day (QID) | RECTAL | 0 refills | Status: DC

## 2018-07-12 MED ORDER — BUDESONIDE 0.5 MG/2ML IN SUSP: 0.5000 mg | Freq: Two times a day (BID) | RESPIRATORY_TRACT | 1 refills | Status: DC

## 2018-07-12 NOTE — Care Management Important Message (Signed)
Important Message  Patient Details  Name: [name] MRN: [id number] Date of Birth: Nov 03, 1950   Medicare Important Message Given:  Yes    Kerin Salen 07/12/2018, 10:56 AMImportant Message  Patient Details  Name: [name] MRN: [id number] Date of Birth: 10-19-1950   Medicare Important Message Given:  Yes    Kerin Salen 07/12/2018, 10:56 AM

## 2018-07-12 NOTE — Progress Notes (Signed)
SATURATION QUALIFICATIONS: (This note is used to comply with regulatory documentation for home oxygen)  Patient Saturations on Room Air at Rest = 96%  Patient Saturations on Room Air while Ambulating = 95%   [place], [place] present during test. O2 discontinued due to patient stable saturations with activity and rest.

## 2018-07-12 NOTE — Discharge Summary (Signed)
[facility] Discharge Summary   Patient ID: [name] MRN: [id number] DOB/AGE: 02/27/1951 68 y.o.  Admit date: 06/13/2018 Discharge date: 07/12/2018  Admitting Diagnosis: SBO due to left incarcerated inguinal hernia   Discharge Diagnosis Patient Active Problem List   Diagnosis Date Noted  . On total parenteral nutrition (TPN)   . Inguinal hernia of left side with obstruction and without gangrene   . Acute hypoxemic respiratory failure ([name])   . Acute metabolic encephalopathy   . SBO (small bowel obstruction) ([name])   . PAD (peripheral artery disease) ([name])   . Incarcerated left inguinal hernia s/p repair 06/18/2018 06/14/2018  . Severe claudication ([name]) 03/28/2018  . Primary cancer of right upper lobe of lung ([name]) 11/30/2017  . Hyponatremia 01/05/2015  . Hypokalemia 01/05/2015  . COPD (chronic obstructive pulmonary disease) ([name]) 01/05/2015  . Essential hypertension 01/05/2015  . Protein-calorie malnutrition, severe ([name]) 01/05/2015  . Pneumothorax of left lung after biopsy 01/04/2015  . Smoker 01/04/2015  . Pneumothorax, traumatic   . Cancer of upper lobe of left lung ([name]) 12/21/2014  . Pancreatic mass 11/23/2014    Consultants Cardiology Internal medicine Critical care Vascular surgery  Imaging: No results found.  Procedures Dr. Marlou Starks (06/30/18) - EXPLORATORY LAPAROTOMY LYSISIS OF ADHESIONS (N/A) Dr. Harlow Asa (06/18/18) - Open left inguinal hernia repair  Hospital Course:  [name] is a 68yo male PMH COPD, lung cancer, and PAD on plavix, who presented to [facility]13[facility]2018 with acute onset nausea, vomiting, and PO intolerance.  Workup showed SBO secondary to left inguinal hernia but no signs of bowel strangulation. NG tube was placed for small bowel decompression and he was admitted to the surgery team.  Plavix was held in anticipation for surgery. On 06/18/18 the patient underwent open left inguinal hernia repair. Noted to have increased confusion  and hypoxia postoperatively therefore he was transferred to the ICU for close monitoring; pulmonology/CCM consulted for assistance with management. He was started on TNA due to severe malnutrition. Patient did start to have some bowel function but he also had persistent abdominal distension, pain, and nausea therefore a repeat CT scan abdomen/pelvis was ordered 1/21; this showed severely dilated small bowel consistent with SBO. NG tube was replaced and patient returned to the OR for exploration on 06/30/18. Intraoperatively he was found to have adhesions at the base of the mesentery causing almost a volvulus of the small bowel; after lysis of adhesions and unrotating the bowel the obstruction appeared resolved. He was taken to the ICU, intubated. Patient self extubated on 07/04/18 and remained stable on supplemental oxygen. He also pulled out his NG tube on 07/05/18. He was evaluated by speech therapy and deemed a mild aspiration risk, therefore he was started on clear liquids and diet slowly advanced as tolerated. Once he was taking in enough calories by mouth he was weaned off the TNA. He completed a 7 day course of zosyn for pneumonia on 1/29.   Patient worked with therapies during this admission who recommended SNF when medically stable for discharge. Patient and his girlfriend declined their recommendation for SNF admission and decided to go home with home health. On 07/12/2018, the patient was voiding well, tolerating diet, ambulating well, pain well controlled, vital signs stable, incisions c/d/i and felt stable for discharge home.  Patient will follow up as below and knows to call with questions or concerns.    I have personally reviewed the patients medication history on the [place] controlled substance database.    Allergies as of  07/12/2018      Reactions   Chantix [varenicline] Other (See Comments)   Physically ill   Prevacid [lansoprazole] Itching   "swell up with gas"      Medication List    STOP  taking these medications   PROAIR HFA 108 (90 Base) MCG/ACT inhaler Generic drug:  albuterol Replaced by:  albuterol (2.5 MG/3ML) 0.083% nebulizer solution     TAKE these medications   acetaminophen 325 MG tablet Commonly known as:  TYLENOL Take 2 tablets (650 mg total) by mouth every 6 (six) hours as needed.   albuterol (2.5 MG/3ML) 0.083% nebulizer solution Commonly known as:  PROVENTIL Take 3 mLs (2.5 mg total) by nebulization every 2 (two) hours as needed for wheezing or shortness of breath. Replaces:  PROAIR HFA 108 (90 Base) MCG/ACT inhaler   amLODipine 10 MG tablet Commonly known as:  NORVASC Take 10 mg by mouth every morning.   budesonide 0.5 MG/2ML nebulizer solution Commonly known as:  PULMICORT Take 2 mLs (0.5 mg total) by nebulization 2 (two) times daily.   buPROPion 150 MG 24 hr tablet Commonly known as:  WELLBUTRIN XL Take 150 mg by mouth every morning.   CARNATION BREAKFAST ESSENTIALS PO Take 1 Container by mouth 2 (two) times daily.   clopidogrel 75 MG tablet Commonly known as:  PLAVIX Take 1 tablet (75 mg total) by mouth daily.   dextromethorphan-guaiFENesin 30-600 MG 12hr tablet Commonly known as:  MUCINEX DM Take 1-2 tablets by mouth 2 (two) times daily as needed for cough.   hydrocortisone-pramoxine 2.5-1 % rectal cream Commonly known as:  ANALPRAM-HC Place rectally 4 (four) times daily.   ipratropium-albuterol 0.5-2.5 (3) MG/3ML Soln Commonly known as:  DUONEB Take 3 mLs by nebulization 3 (three) times daily. What changed:    when to take this  reasons to take this   metoprolol succinate 50 MG 24 hr tablet Commonly known as:  TOPROL-XL Take 1 tablet (50 mg total) by mouth every morning.   multivitamin with minerals Tabs tablet Take 1 tablet by mouth every morning.   saccharomyces boulardii 250 MG capsule Commonly known as:  FLORASTOR Take 1 capsule (250 mg total) by mouth 2 (two) times daily. You can get a probiotic over the counter.    traMADol 50 MG tablet Commonly known as:  ULTRAM Take 1 tablet (50 mg total) by mouth every 6 (six) hours as needed for severe pain.   triamcinolone 0.025 % cream Commonly known as:  KENALOG Apply 1 application topically 2 (two) times daily. Apply to face   triamcinolone ointment 0.1 % Commonly known as:  KENALOG Apply 1 application topically 2 (two) times daily. Apply to legs and ears            Durable Medical Equipment  (From admission, onward)         Start     Ordered   07/09/18 1034  For home use only DME Nebulizer machine  Once    Question:  Patient needs a nebulizer to treat with the following condition  Answer:  COPD (chronic obstructive pulmonary disease) ([place])   07/09/18 1034   06/28/18 1056  For home use only DME Walker rolling  Once    Question:  Patient needs a walker to treat with the following condition  Answer:  Balance disorder   06/28/18 1055   06/28/18 1056  For home use only DME 3 n 1  Once     06/28/18 1055  Follow-up Information    Tyler Pita, MD Follow up in 3 week(s).   Specialty:  Radiation Oncology Why:  for lung cancer follow up. Contact information: [place] [id number] [phone]        [name], MD Follow up.   Specialty:  Internal Medicine Why:  call and get a follow up appointment for 1-2 weeks so he can follow up on Medical issuse and medications. Contact information: [place] [address] [phone]        Armandina Gemma, MD Follow up on 07/14/2018.   Specialty:  General Surgery Why:  Your appointment is at: 11:15AM.  Be at the office 30 minutes early for check-in.  Bring photo ID and insurance information. Contact information: [address] [place] [address] [phone]        Angelia Mould, MD Follow up in 1 month(s).   Specialties:  Vascular Surgery, Cardiology Why:  PAD  Contact information: [place] [address] [phone]        Lauraine Rinne, NP Follow up on 07/26/2018.   Specialty:  Pulmonary Disease Why:  Appt at 3PM for pulmonary follow up.  Please arrive at 2:45 for check in.  Contact information: [place] [address] [phone]           Signed: Wellington Hampshire, [facility] 07/12/2018, 11:32 AM Pager: [phone] Mon-Thurs 7:00 am-4:30 pm Fri 7:00 am -11:30 AM Sat-Sun 7:00 am-11:30 am

## 2018-07-12 NOTE — Care Management (Signed)
[facility] rep to bring nebulizer, 3in1 and RW to room. Home 02 is not needed per desat screen done by nursing staff. Marney Doctor RN,BSN [phone]

## 2018-07-12 NOTE — Progress Notes (Signed)
12 Days Post-Op    CC:  Subjective: Sitting up in bed, states that he's ready to go home. Appetite improving. He ate [employer] and [employer] yesterday. He had BM x2 yesterday, and one formed stool this morning.  Has not been on any supplemental O2 this weekend.  Objective: Vital signs in last 24 hours: Temp:  [97.5 F (36.4 C)-98.5 F (36.9 C)] 97.5 F (36.4 C) (02/03 0427) Pulse Rate:  [74-86] 74 (02/03 0427) Resp:  [16-18] 16 (02/03 0427) BP: (149-152)/(87-99) 152/88 (02/03 0427) SpO2:  [92 %-96 %] 93 % (02/03 0732) Last BM Date: 07/10/18 1551 PO 238 IV 1050 urine BM x 2 Afebrile, satx down to 88% this AM; 93-96% on Room air yesterday 07/09/18 Patient Saturations on Room Air at Rest = 88% Patient Saturations on Room Air while Ambulating = 92% Patient Saturations on 1 Liters of oxygen while Ambulating = 90%   Intake/Output from previous day: 02/02 0701 - 02/03 0700 In: 1789 [P.O.:1551; I.V.:238] Out: 1050 [Urine:1050] Intake/Output this shift: Total I/O In: 240 [P.O.:240] Out: 0   Physical exam: Gen:  Alert, NAD, cooperative HEENT: EOM's intact, pupils equal and round Card:  RRR Pulm:  Few scattered rhonchi, no wheezing, effort normal on room air Abd: Soft, nondistended, nontender, + BS, midline incision cdi with staples intact and no erythema or drainage Ext:  Calves soft and nontender, no edema Skin: warm and dry  Lab Results:  No results for input(s): WBC, HGB, HCT, PLT in the last 72 hours.  BMET No results for input(s): NA, K, CL, CO2, GLUCOSE, BUN, CREATININE, CALCIUM in the last 72 hours. PT/INR No results for input(s): LABPROT, INR in the last 72 hours.  Recent Labs  Lab 07/08/18 0431  AST 39  ALT 33  ALKPHOS 116  BILITOT 0.6  PROT 5.6*  ALBUMIN 2.4*     Lipase     Component Value Date/Time   LIPASE 53 (H) 06/13/2018 1924     Medications: . acetaminophen  650 mg Oral Q6H  . amLODipine  10 mg Oral q morning - 10a  . budesonide  (PULMICORT) nebulizer solution  0.5 mg Nebulization BID  . buPROPion  150 mg Oral q morning - 10a  . Chlorhexidine Gluconate Cloth  6 each Topical Daily  . clopidogrel  75 mg Oral Daily  . enoxaparin (LOVENOX) injection  30 mg Subcutaneous Q24H  . famotidine  20 mg Oral QHS  . feeding supplement (ENSURE ENLIVE)  237 mL Oral TID BM  . hydrocortisone-pramoxine   Rectal QID  . insulin aspart  0-5 Units Subcutaneous QHS  . insulin aspart  0-9 Units Subcutaneous TID WC  . ipratropium-albuterol  3 mL Nebulization TID  . mouth rinse  15 mL Mouth Rinse BID  . metoprolol succinate  50 mg Oral q morning - 10a  . multivitamin with minerals  1 tablet Oral Daily  . saccharomyces boulardii  250 mg Oral BID  . sodium chloride flush  10-40 mL Intracatheter Q12H    Assessment/Plan Hx Non-small cell lung CA HXTobacco abuse  Acute metabolic encephalopathy/postop delirium -fall/ongoing confusion 11/12. Neg Ct head -Medicine following - sedated and on Vent today Hyponatremia/Hypokalemia/Hypomagnesmia History of alcohol abuse Hypertension- Lopressor, Norvasc scheduled, Apresoline prn PAD/critical left limb ischemia- non healing leftleft first metatarsal head -Vascular has seen the patient SevereMalnutrition(BMI 16.33) Internal hemorrhoids -Seen by Dr. Thana Farr in April 2019, s/p rubber band ligation -Analapram prn.Outpt follow up Anemia - chronic illness  Incarcerated recurrent left inguinal hernia, small bowel  obstruction open recurrent left inguinal hernia repair with mesh, Dr. Harlow Asa, 06/18/18, POD# 24 Exploratory laparotomy lysis of adhesions, with 2 sites of closed loop obstructions 06/30/18, Dr. Fredrik Cove IIIPOD# 12  -Admitted 1/5 for incarcerated recurrent left inguinal hernia with small bowel obstructionw transition LLQ -CTA 1/13 with persistent high grade SBO.Thought to berelated to edema in the incarcerated knuckle of bowel he had - wasseen intraoperatively and  appeared clearly viable. -Started on TPN1/14 -Diet advancedto soft on 1/18 -1/20 -Improving clinically. Calorie count ordered -1/21 -Incabdominal pain, distension and nausea -Ct yesterday with SBO pattern with no discrete transition zone. NG tube was placed.  -Patient confused this morning. Removed NG tube and PICC line overnight -Continued abdominal distension, w/ hypoactive BS, small bowel distension up to 7cm on film this AM. Will discuss with Dr. Marlou Starks. Possible Ex Lap given ongoing, unresolved SBO1/22/20 - pt pulled NG Out 1/27 - Modified BA swallow 1/28: Clears started today with moderate aspiration risk  COPD -Acute respiratory illness post-op- followed by Pulm -CT 1/21 with right middle lobe collapse and small right pleural effusion.  -Pulm re consulted and will follow. Appreciate this. - Post op ARF - on Vent - self extubated 1/26 ~1800 hrs on Nasal O2 - IS 680 - 07/09/18 - I told him to aim for 1000 on IS  FEN:off TNA, regular diet ID: Preop Ancef; Azithromycin &Rocephin (COPD)1/14- 1/18; Zosyn 1/22 -1/29 DVT: Lovenox Follow-up with Dr.Gerkin  Plan: Will discharge patient today. D/c PICC line. Recheck O2 sats to confirm whether or not he needs home O2.    LOS: 28 days    Wellington Hampshire, [facility] 07/12/2018, 11:00 AM Pager: [phone] Mon 7:00 am -11:30 AM Tues-Fri 7:00 am-4:30 pm Sat-Sun 7:00 am-11:30 am

## 2018-07-13 DIAGNOSIS — Z9889 Other specified postprocedural states: Secondary | ICD-10-CM | POA: Diagnosis not present

## 2018-07-13 DIAGNOSIS — D638 Anemia in other chronic diseases classified elsewhere: Secondary | ICD-10-CM | POA: Diagnosis not present

## 2018-07-13 DIAGNOSIS — Z95820 Peripheral vascular angioplasty status with implants and grafts: Secondary | ICD-10-CM | POA: Diagnosis not present

## 2018-07-13 DIAGNOSIS — Z8719 Personal history of other diseases of the digestive system: Secondary | ICD-10-CM | POA: Diagnosis not present

## 2018-07-13 DIAGNOSIS — I272 Pulmonary hypertension, unspecified: Secondary | ICD-10-CM | POA: Diagnosis not present

## 2018-07-13 DIAGNOSIS — M810 Age-related osteoporosis without current pathological fracture: Secondary | ICD-10-CM | POA: Diagnosis not present

## 2018-07-13 DIAGNOSIS — I700 Atherosclerosis of aorta: Secondary | ICD-10-CM | POA: Diagnosis not present

## 2018-07-13 DIAGNOSIS — E43 Unspecified severe protein-calorie malnutrition: Secondary | ICD-10-CM | POA: Diagnosis not present

## 2018-07-13 DIAGNOSIS — I739 Peripheral vascular disease, unspecified: Secondary | ICD-10-CM | POA: Diagnosis not present

## 2018-07-13 DIAGNOSIS — C3412 Malignant neoplasm of upper lobe, left bronchus or lung: Secondary | ICD-10-CM | POA: Diagnosis not present

## 2018-07-13 DIAGNOSIS — Z48815 Encounter for surgical aftercare following surgery on the digestive system: Secondary | ICD-10-CM | POA: Diagnosis not present

## 2018-07-13 DIAGNOSIS — Z7951 Long term (current) use of inhaled steroids: Secondary | ICD-10-CM | POA: Diagnosis not present

## 2018-07-13 DIAGNOSIS — J439 Emphysema, unspecified: Secondary | ICD-10-CM | POA: Diagnosis not present

## 2018-07-13 DIAGNOSIS — I743 Embolism and thrombosis of arteries of the lower extremities: Secondary | ICD-10-CM | POA: Diagnosis not present

## 2018-07-13 DIAGNOSIS — I10 Essential (primary) hypertension: Secondary | ICD-10-CM | POA: Diagnosis not present

## 2018-07-13 DIAGNOSIS — R918 Other nonspecific abnormal finding of lung field: Secondary | ICD-10-CM | POA: Diagnosis not present

## 2018-07-13 DIAGNOSIS — Z7902 Long term (current) use of antithrombotics/antiplatelets: Secondary | ICD-10-CM | POA: Diagnosis not present

## 2018-07-13 DIAGNOSIS — K861 Other chronic pancreatitis: Secondary | ICD-10-CM | POA: Diagnosis not present

## 2018-07-14 DIAGNOSIS — R918 Other nonspecific abnormal finding of lung field: Secondary | ICD-10-CM | POA: Diagnosis not present

## 2018-07-14 DIAGNOSIS — I10 Essential (primary) hypertension: Secondary | ICD-10-CM | POA: Diagnosis not present

## 2018-07-14 DIAGNOSIS — D638 Anemia in other chronic diseases classified elsewhere: Secondary | ICD-10-CM | POA: Diagnosis not present

## 2018-07-14 DIAGNOSIS — M810 Age-related osteoporosis without current pathological fracture: Secondary | ICD-10-CM | POA: Diagnosis not present

## 2018-07-14 DIAGNOSIS — Z95820 Peripheral vascular angioplasty status with implants and grafts: Secondary | ICD-10-CM | POA: Diagnosis not present

## 2018-07-14 DIAGNOSIS — I739 Peripheral vascular disease, unspecified: Secondary | ICD-10-CM | POA: Diagnosis not present

## 2018-07-14 DIAGNOSIS — I743 Embolism and thrombosis of arteries of the lower extremities: Secondary | ICD-10-CM | POA: Diagnosis not present

## 2018-07-14 DIAGNOSIS — E43 Unspecified severe protein-calorie malnutrition: Secondary | ICD-10-CM | POA: Diagnosis not present

## 2018-07-14 DIAGNOSIS — Z48815 Encounter for surgical aftercare following surgery on the digestive system: Secondary | ICD-10-CM | POA: Diagnosis not present

## 2018-07-14 DIAGNOSIS — J439 Emphysema, unspecified: Secondary | ICD-10-CM | POA: Diagnosis not present

## 2018-07-14 DIAGNOSIS — I700 Atherosclerosis of aorta: Secondary | ICD-10-CM | POA: Diagnosis not present

## 2018-07-14 DIAGNOSIS — Z9889 Other specified postprocedural states: Secondary | ICD-10-CM | POA: Diagnosis not present

## 2018-07-14 DIAGNOSIS — Z8719 Personal history of other diseases of the digestive system: Secondary | ICD-10-CM | POA: Diagnosis not present

## 2018-07-14 DIAGNOSIS — Z7902 Long term (current) use of antithrombotics/antiplatelets: Secondary | ICD-10-CM | POA: Diagnosis not present

## 2018-07-14 DIAGNOSIS — I272 Pulmonary hypertension, unspecified: Secondary | ICD-10-CM | POA: Diagnosis not present

## 2018-07-14 DIAGNOSIS — Z7951 Long term (current) use of inhaled steroids: Secondary | ICD-10-CM | POA: Diagnosis not present

## 2018-07-14 DIAGNOSIS — C3412 Malignant neoplasm of upper lobe, left bronchus or lung: Secondary | ICD-10-CM | POA: Diagnosis not present

## 2018-07-14 DIAGNOSIS — K861 Other chronic pancreatitis: Secondary | ICD-10-CM | POA: Diagnosis not present

## 2018-07-15 ENCOUNTER — Telehealth: Payer: Self-pay | Admitting: Vascular Surgery

## 2018-07-15 DIAGNOSIS — Z8719 Personal history of other diseases of the digestive system: Secondary | ICD-10-CM | POA: Diagnosis not present

## 2018-07-15 DIAGNOSIS — K861 Other chronic pancreatitis: Secondary | ICD-10-CM | POA: Diagnosis not present

## 2018-07-15 DIAGNOSIS — I743 Embolism and thrombosis of arteries of the lower extremities: Secondary | ICD-10-CM | POA: Diagnosis not present

## 2018-07-15 DIAGNOSIS — Z7902 Long term (current) use of antithrombotics/antiplatelets: Secondary | ICD-10-CM | POA: Diagnosis not present

## 2018-07-15 DIAGNOSIS — Z95820 Peripheral vascular angioplasty status with implants and grafts: Secondary | ICD-10-CM | POA: Diagnosis not present

## 2018-07-15 DIAGNOSIS — I739 Peripheral vascular disease, unspecified: Secondary | ICD-10-CM | POA: Diagnosis not present

## 2018-07-15 DIAGNOSIS — Z48815 Encounter for surgical aftercare following surgery on the digestive system: Secondary | ICD-10-CM | POA: Diagnosis not present

## 2018-07-15 DIAGNOSIS — I700 Atherosclerosis of aorta: Secondary | ICD-10-CM | POA: Diagnosis not present

## 2018-07-15 DIAGNOSIS — I272 Pulmonary hypertension, unspecified: Secondary | ICD-10-CM | POA: Diagnosis not present

## 2018-07-15 DIAGNOSIS — M810 Age-related osteoporosis without current pathological fracture: Secondary | ICD-10-CM | POA: Diagnosis not present

## 2018-07-15 DIAGNOSIS — I10 Essential (primary) hypertension: Secondary | ICD-10-CM | POA: Diagnosis not present

## 2018-07-15 DIAGNOSIS — C3412 Malignant neoplasm of upper lobe, left bronchus or lung: Secondary | ICD-10-CM | POA: Diagnosis not present

## 2018-07-15 DIAGNOSIS — J439 Emphysema, unspecified: Secondary | ICD-10-CM | POA: Diagnosis not present

## 2018-07-15 DIAGNOSIS — Z9889 Other specified postprocedural states: Secondary | ICD-10-CM | POA: Diagnosis not present

## 2018-07-15 DIAGNOSIS — E43 Unspecified severe protein-calorie malnutrition: Secondary | ICD-10-CM | POA: Diagnosis not present

## 2018-07-15 DIAGNOSIS — R918 Other nonspecific abnormal finding of lung field: Secondary | ICD-10-CM | POA: Diagnosis not present

## 2018-07-15 DIAGNOSIS — D638 Anemia in other chronic diseases classified elsewhere: Secondary | ICD-10-CM | POA: Diagnosis not present

## 2018-07-15 DIAGNOSIS — Z7951 Long term (current) use of inhaled steroids: Secondary | ICD-10-CM | POA: Diagnosis not present

## 2018-07-15 NOTE — Telephone Encounter (Signed)
sch appt spk to pt friend mld ltr 09/08/2018 115am wound check

## 2018-07-18 DIAGNOSIS — M810 Age-related osteoporosis without current pathological fracture: Secondary | ICD-10-CM | POA: Diagnosis not present

## 2018-07-18 DIAGNOSIS — Z7951 Long term (current) use of inhaled steroids: Secondary | ICD-10-CM | POA: Diagnosis not present

## 2018-07-18 DIAGNOSIS — I743 Embolism and thrombosis of arteries of the lower extremities: Secondary | ICD-10-CM | POA: Diagnosis not present

## 2018-07-18 DIAGNOSIS — I700 Atherosclerosis of aorta: Secondary | ICD-10-CM | POA: Diagnosis not present

## 2018-07-18 DIAGNOSIS — K861 Other chronic pancreatitis: Secondary | ICD-10-CM | POA: Diagnosis not present

## 2018-07-18 DIAGNOSIS — I739 Peripheral vascular disease, unspecified: Secondary | ICD-10-CM | POA: Diagnosis not present

## 2018-07-18 DIAGNOSIS — R918 Other nonspecific abnormal finding of lung field: Secondary | ICD-10-CM | POA: Diagnosis not present

## 2018-07-18 DIAGNOSIS — I272 Pulmonary hypertension, unspecified: Secondary | ICD-10-CM | POA: Diagnosis not present

## 2018-07-18 DIAGNOSIS — I10 Essential (primary) hypertension: Secondary | ICD-10-CM | POA: Diagnosis not present

## 2018-07-18 DIAGNOSIS — E43 Unspecified severe protein-calorie malnutrition: Secondary | ICD-10-CM | POA: Diagnosis not present

## 2018-07-18 DIAGNOSIS — C3412 Malignant neoplasm of upper lobe, left bronchus or lung: Secondary | ICD-10-CM | POA: Diagnosis not present

## 2018-07-18 DIAGNOSIS — J439 Emphysema, unspecified: Secondary | ICD-10-CM | POA: Diagnosis not present

## 2018-07-18 DIAGNOSIS — Z48815 Encounter for surgical aftercare following surgery on the digestive system: Secondary | ICD-10-CM | POA: Diagnosis not present

## 2018-07-18 DIAGNOSIS — Z7902 Long term (current) use of antithrombotics/antiplatelets: Secondary | ICD-10-CM | POA: Diagnosis not present

## 2018-07-18 DIAGNOSIS — D638 Anemia in other chronic diseases classified elsewhere: Secondary | ICD-10-CM | POA: Diagnosis not present

## 2018-07-18 DIAGNOSIS — Z9889 Other specified postprocedural states: Secondary | ICD-10-CM | POA: Diagnosis not present

## 2018-07-18 DIAGNOSIS — Z8719 Personal history of other diseases of the digestive system: Secondary | ICD-10-CM | POA: Diagnosis not present

## 2018-07-18 DIAGNOSIS — Z95820 Peripheral vascular angioplasty status with implants and grafts: Secondary | ICD-10-CM | POA: Diagnosis not present

## 2018-07-19 DIAGNOSIS — I272 Pulmonary hypertension, unspecified: Secondary | ICD-10-CM | POA: Diagnosis not present

## 2018-07-19 DIAGNOSIS — C3412 Malignant neoplasm of upper lobe, left bronchus or lung: Secondary | ICD-10-CM | POA: Diagnosis not present

## 2018-07-19 DIAGNOSIS — Z48815 Encounter for surgical aftercare following surgery on the digestive system: Secondary | ICD-10-CM | POA: Diagnosis not present

## 2018-07-19 DIAGNOSIS — I739 Peripheral vascular disease, unspecified: Secondary | ICD-10-CM | POA: Diagnosis not present

## 2018-07-19 DIAGNOSIS — Z9889 Other specified postprocedural states: Secondary | ICD-10-CM | POA: Diagnosis not present

## 2018-07-19 DIAGNOSIS — R918 Other nonspecific abnormal finding of lung field: Secondary | ICD-10-CM | POA: Diagnosis not present

## 2018-07-19 DIAGNOSIS — Z8719 Personal history of other diseases of the digestive system: Secondary | ICD-10-CM | POA: Diagnosis not present

## 2018-07-19 DIAGNOSIS — I700 Atherosclerosis of aorta: Secondary | ICD-10-CM | POA: Diagnosis not present

## 2018-07-19 DIAGNOSIS — Z7951 Long term (current) use of inhaled steroids: Secondary | ICD-10-CM | POA: Diagnosis not present

## 2018-07-19 DIAGNOSIS — I10 Essential (primary) hypertension: Secondary | ICD-10-CM | POA: Diagnosis not present

## 2018-07-19 DIAGNOSIS — J439 Emphysema, unspecified: Secondary | ICD-10-CM | POA: Diagnosis not present

## 2018-07-19 DIAGNOSIS — D638 Anemia in other chronic diseases classified elsewhere: Secondary | ICD-10-CM | POA: Diagnosis not present

## 2018-07-19 DIAGNOSIS — I743 Embolism and thrombosis of arteries of the lower extremities: Secondary | ICD-10-CM | POA: Diagnosis not present

## 2018-07-19 DIAGNOSIS — Z7902 Long term (current) use of antithrombotics/antiplatelets: Secondary | ICD-10-CM | POA: Diagnosis not present

## 2018-07-19 DIAGNOSIS — Z95820 Peripheral vascular angioplasty status with implants and grafts: Secondary | ICD-10-CM | POA: Diagnosis not present

## 2018-07-19 DIAGNOSIS — M810 Age-related osteoporosis without current pathological fracture: Secondary | ICD-10-CM | POA: Diagnosis not present

## 2018-07-19 DIAGNOSIS — E43 Unspecified severe protein-calorie malnutrition: Secondary | ICD-10-CM | POA: Diagnosis not present

## 2018-07-19 DIAGNOSIS — K861 Other chronic pancreatitis: Secondary | ICD-10-CM | POA: Diagnosis not present

## 2018-07-20 ENCOUNTER — Encounter: Payer: Medicare Other | Admitting: Vascular Surgery

## 2018-07-20 DIAGNOSIS — Z95820 Peripheral vascular angioplasty status with implants and grafts: Secondary | ICD-10-CM | POA: Diagnosis not present

## 2018-07-20 DIAGNOSIS — M810 Age-related osteoporosis without current pathological fracture: Secondary | ICD-10-CM | POA: Diagnosis not present

## 2018-07-20 DIAGNOSIS — Z7902 Long term (current) use of antithrombotics/antiplatelets: Secondary | ICD-10-CM | POA: Diagnosis not present

## 2018-07-20 DIAGNOSIS — Z8719 Personal history of other diseases of the digestive system: Secondary | ICD-10-CM | POA: Diagnosis not present

## 2018-07-20 DIAGNOSIS — I743 Embolism and thrombosis of arteries of the lower extremities: Secondary | ICD-10-CM | POA: Diagnosis not present

## 2018-07-20 DIAGNOSIS — C3412 Malignant neoplasm of upper lobe, left bronchus or lung: Secondary | ICD-10-CM | POA: Diagnosis not present

## 2018-07-20 DIAGNOSIS — I10 Essential (primary) hypertension: Secondary | ICD-10-CM | POA: Diagnosis not present

## 2018-07-20 DIAGNOSIS — I272 Pulmonary hypertension, unspecified: Secondary | ICD-10-CM | POA: Diagnosis not present

## 2018-07-20 DIAGNOSIS — K861 Other chronic pancreatitis: Secondary | ICD-10-CM | POA: Diagnosis not present

## 2018-07-20 DIAGNOSIS — Z9889 Other specified postprocedural states: Secondary | ICD-10-CM | POA: Diagnosis not present

## 2018-07-20 DIAGNOSIS — R918 Other nonspecific abnormal finding of lung field: Secondary | ICD-10-CM | POA: Diagnosis not present

## 2018-07-20 DIAGNOSIS — I700 Atherosclerosis of aorta: Secondary | ICD-10-CM | POA: Diagnosis not present

## 2018-07-20 DIAGNOSIS — J439 Emphysema, unspecified: Secondary | ICD-10-CM | POA: Diagnosis not present

## 2018-07-20 DIAGNOSIS — Z7951 Long term (current) use of inhaled steroids: Secondary | ICD-10-CM | POA: Diagnosis not present

## 2018-07-20 DIAGNOSIS — E43 Unspecified severe protein-calorie malnutrition: Secondary | ICD-10-CM | POA: Diagnosis not present

## 2018-07-20 DIAGNOSIS — I739 Peripheral vascular disease, unspecified: Secondary | ICD-10-CM | POA: Diagnosis not present

## 2018-07-20 DIAGNOSIS — Z48815 Encounter for surgical aftercare following surgery on the digestive system: Secondary | ICD-10-CM | POA: Diagnosis not present

## 2018-07-20 DIAGNOSIS — D638 Anemia in other chronic diseases classified elsewhere: Secondary | ICD-10-CM | POA: Diagnosis not present

## 2018-07-21 DIAGNOSIS — I10 Essential (primary) hypertension: Secondary | ICD-10-CM | POA: Diagnosis not present

## 2018-07-21 DIAGNOSIS — Z95820 Peripheral vascular angioplasty status with implants and grafts: Secondary | ICD-10-CM | POA: Diagnosis not present

## 2018-07-21 DIAGNOSIS — K861 Other chronic pancreatitis: Secondary | ICD-10-CM | POA: Diagnosis not present

## 2018-07-21 DIAGNOSIS — Z8719 Personal history of other diseases of the digestive system: Secondary | ICD-10-CM | POA: Diagnosis not present

## 2018-07-21 DIAGNOSIS — I700 Atherosclerosis of aorta: Secondary | ICD-10-CM | POA: Diagnosis not present

## 2018-07-21 DIAGNOSIS — M810 Age-related osteoporosis without current pathological fracture: Secondary | ICD-10-CM | POA: Diagnosis not present

## 2018-07-21 DIAGNOSIS — R918 Other nonspecific abnormal finding of lung field: Secondary | ICD-10-CM | POA: Diagnosis not present

## 2018-07-21 DIAGNOSIS — Z9889 Other specified postprocedural states: Secondary | ICD-10-CM | POA: Diagnosis not present

## 2018-07-21 DIAGNOSIS — D638 Anemia in other chronic diseases classified elsewhere: Secondary | ICD-10-CM | POA: Diagnosis not present

## 2018-07-21 DIAGNOSIS — Z7951 Long term (current) use of inhaled steroids: Secondary | ICD-10-CM | POA: Diagnosis not present

## 2018-07-21 DIAGNOSIS — E43 Unspecified severe protein-calorie malnutrition: Secondary | ICD-10-CM | POA: Diagnosis not present

## 2018-07-21 DIAGNOSIS — I739 Peripheral vascular disease, unspecified: Secondary | ICD-10-CM | POA: Diagnosis not present

## 2018-07-21 DIAGNOSIS — Z48815 Encounter for surgical aftercare following surgery on the digestive system: Secondary | ICD-10-CM | POA: Diagnosis not present

## 2018-07-21 DIAGNOSIS — I272 Pulmonary hypertension, unspecified: Secondary | ICD-10-CM | POA: Diagnosis not present

## 2018-07-21 DIAGNOSIS — I743 Embolism and thrombosis of arteries of the lower extremities: Secondary | ICD-10-CM | POA: Diagnosis not present

## 2018-07-21 DIAGNOSIS — Z7902 Long term (current) use of antithrombotics/antiplatelets: Secondary | ICD-10-CM | POA: Diagnosis not present

## 2018-07-21 DIAGNOSIS — C3412 Malignant neoplasm of upper lobe, left bronchus or lung: Secondary | ICD-10-CM | POA: Diagnosis not present

## 2018-07-21 DIAGNOSIS — J439 Emphysema, unspecified: Secondary | ICD-10-CM | POA: Diagnosis not present

## 2018-07-22 DIAGNOSIS — M810 Age-related osteoporosis without current pathological fracture: Secondary | ICD-10-CM | POA: Diagnosis not present

## 2018-07-22 DIAGNOSIS — Z7951 Long term (current) use of inhaled steroids: Secondary | ICD-10-CM | POA: Diagnosis not present

## 2018-07-22 DIAGNOSIS — R918 Other nonspecific abnormal finding of lung field: Secondary | ICD-10-CM | POA: Diagnosis not present

## 2018-07-22 DIAGNOSIS — I272 Pulmonary hypertension, unspecified: Secondary | ICD-10-CM | POA: Diagnosis not present

## 2018-07-22 DIAGNOSIS — I739 Peripheral vascular disease, unspecified: Secondary | ICD-10-CM | POA: Diagnosis not present

## 2018-07-22 DIAGNOSIS — Z9889 Other specified postprocedural states: Secondary | ICD-10-CM | POA: Diagnosis not present

## 2018-07-22 DIAGNOSIS — D638 Anemia in other chronic diseases classified elsewhere: Secondary | ICD-10-CM | POA: Diagnosis not present

## 2018-07-22 DIAGNOSIS — I743 Embolism and thrombosis of arteries of the lower extremities: Secondary | ICD-10-CM | POA: Diagnosis not present

## 2018-07-22 DIAGNOSIS — J439 Emphysema, unspecified: Secondary | ICD-10-CM | POA: Diagnosis not present

## 2018-07-22 DIAGNOSIS — K861 Other chronic pancreatitis: Secondary | ICD-10-CM | POA: Diagnosis not present

## 2018-07-22 DIAGNOSIS — Z8719 Personal history of other diseases of the digestive system: Secondary | ICD-10-CM | POA: Diagnosis not present

## 2018-07-22 DIAGNOSIS — Z95820 Peripheral vascular angioplasty status with implants and grafts: Secondary | ICD-10-CM | POA: Diagnosis not present

## 2018-07-22 DIAGNOSIS — Z7902 Long term (current) use of antithrombotics/antiplatelets: Secondary | ICD-10-CM | POA: Diagnosis not present

## 2018-07-22 DIAGNOSIS — C3412 Malignant neoplasm of upper lobe, left bronchus or lung: Secondary | ICD-10-CM | POA: Diagnosis not present

## 2018-07-22 DIAGNOSIS — E43 Unspecified severe protein-calorie malnutrition: Secondary | ICD-10-CM | POA: Diagnosis not present

## 2018-07-22 DIAGNOSIS — Z48815 Encounter for surgical aftercare following surgery on the digestive system: Secondary | ICD-10-CM | POA: Diagnosis not present

## 2018-07-22 DIAGNOSIS — I700 Atherosclerosis of aorta: Secondary | ICD-10-CM | POA: Diagnosis not present

## 2018-07-22 DIAGNOSIS — I10 Essential (primary) hypertension: Secondary | ICD-10-CM | POA: Diagnosis not present

## 2018-07-23 DIAGNOSIS — K861 Other chronic pancreatitis: Secondary | ICD-10-CM | POA: Diagnosis not present

## 2018-07-23 DIAGNOSIS — J439 Emphysema, unspecified: Secondary | ICD-10-CM | POA: Diagnosis not present

## 2018-07-23 DIAGNOSIS — Z8719 Personal history of other diseases of the digestive system: Secondary | ICD-10-CM | POA: Diagnosis not present

## 2018-07-23 DIAGNOSIS — R918 Other nonspecific abnormal finding of lung field: Secondary | ICD-10-CM | POA: Diagnosis not present

## 2018-07-23 DIAGNOSIS — I272 Pulmonary hypertension, unspecified: Secondary | ICD-10-CM | POA: Diagnosis not present

## 2018-07-23 DIAGNOSIS — Z9889 Other specified postprocedural states: Secondary | ICD-10-CM | POA: Diagnosis not present

## 2018-07-23 DIAGNOSIS — I739 Peripheral vascular disease, unspecified: Secondary | ICD-10-CM | POA: Diagnosis not present

## 2018-07-23 DIAGNOSIS — I10 Essential (primary) hypertension: Secondary | ICD-10-CM | POA: Diagnosis not present

## 2018-07-23 DIAGNOSIS — Z7951 Long term (current) use of inhaled steroids: Secondary | ICD-10-CM | POA: Diagnosis not present

## 2018-07-23 DIAGNOSIS — Z48815 Encounter for surgical aftercare following surgery on the digestive system: Secondary | ICD-10-CM | POA: Diagnosis not present

## 2018-07-23 DIAGNOSIS — M810 Age-related osteoporosis without current pathological fracture: Secondary | ICD-10-CM | POA: Diagnosis not present

## 2018-07-23 DIAGNOSIS — D638 Anemia in other chronic diseases classified elsewhere: Secondary | ICD-10-CM | POA: Diagnosis not present

## 2018-07-23 DIAGNOSIS — C3412 Malignant neoplasm of upper lobe, left bronchus or lung: Secondary | ICD-10-CM | POA: Diagnosis not present

## 2018-07-23 DIAGNOSIS — I743 Embolism and thrombosis of arteries of the lower extremities: Secondary | ICD-10-CM | POA: Diagnosis not present

## 2018-07-23 DIAGNOSIS — E43 Unspecified severe protein-calorie malnutrition: Secondary | ICD-10-CM | POA: Diagnosis not present

## 2018-07-23 DIAGNOSIS — I700 Atherosclerosis of aorta: Secondary | ICD-10-CM | POA: Diagnosis not present

## 2018-07-23 DIAGNOSIS — Z7902 Long term (current) use of antithrombotics/antiplatelets: Secondary | ICD-10-CM | POA: Diagnosis not present

## 2018-07-23 DIAGNOSIS — Z95820 Peripheral vascular angioplasty status with implants and grafts: Secondary | ICD-10-CM | POA: Diagnosis not present

## 2018-07-25 DIAGNOSIS — R918 Other nonspecific abnormal finding of lung field: Secondary | ICD-10-CM | POA: Diagnosis not present

## 2018-07-25 DIAGNOSIS — Z9889 Other specified postprocedural states: Secondary | ICD-10-CM | POA: Diagnosis not present

## 2018-07-25 DIAGNOSIS — I700 Atherosclerosis of aorta: Secondary | ICD-10-CM | POA: Diagnosis not present

## 2018-07-25 DIAGNOSIS — Z7902 Long term (current) use of antithrombotics/antiplatelets: Secondary | ICD-10-CM | POA: Diagnosis not present

## 2018-07-25 DIAGNOSIS — K861 Other chronic pancreatitis: Secondary | ICD-10-CM | POA: Diagnosis not present

## 2018-07-25 DIAGNOSIS — I743 Embolism and thrombosis of arteries of the lower extremities: Secondary | ICD-10-CM | POA: Diagnosis not present

## 2018-07-25 DIAGNOSIS — Z7951 Long term (current) use of inhaled steroids: Secondary | ICD-10-CM | POA: Diagnosis not present

## 2018-07-25 DIAGNOSIS — Z8719 Personal history of other diseases of the digestive system: Secondary | ICD-10-CM | POA: Diagnosis not present

## 2018-07-25 DIAGNOSIS — J439 Emphysema, unspecified: Secondary | ICD-10-CM | POA: Diagnosis not present

## 2018-07-25 DIAGNOSIS — E43 Unspecified severe protein-calorie malnutrition: Secondary | ICD-10-CM | POA: Diagnosis not present

## 2018-07-25 DIAGNOSIS — C3412 Malignant neoplasm of upper lobe, left bronchus or lung: Secondary | ICD-10-CM | POA: Diagnosis not present

## 2018-07-25 DIAGNOSIS — Z48815 Encounter for surgical aftercare following surgery on the digestive system: Secondary | ICD-10-CM | POA: Diagnosis not present

## 2018-07-25 DIAGNOSIS — D638 Anemia in other chronic diseases classified elsewhere: Secondary | ICD-10-CM | POA: Diagnosis not present

## 2018-07-25 DIAGNOSIS — I272 Pulmonary hypertension, unspecified: Secondary | ICD-10-CM | POA: Diagnosis not present

## 2018-07-25 DIAGNOSIS — I10 Essential (primary) hypertension: Secondary | ICD-10-CM | POA: Diagnosis not present

## 2018-07-25 DIAGNOSIS — I739 Peripheral vascular disease, unspecified: Secondary | ICD-10-CM | POA: Diagnosis not present

## 2018-07-25 DIAGNOSIS — Z95820 Peripheral vascular angioplasty status with implants and grafts: Secondary | ICD-10-CM | POA: Diagnosis not present

## 2018-07-25 DIAGNOSIS — M810 Age-related osteoporosis without current pathological fracture: Secondary | ICD-10-CM | POA: Diagnosis not present

## 2018-07-25 NOTE — Progress Notes (Signed)
@Patient  ID: [name], male    DOB: 09-25-1950, 68 y.o.   MRN: [id number]  Chief Complaint  Patient presents with  . Hospitalization Follow-up    Hospital follow-up, COPD    Referring provider: [name], MD  HPI:  68 year old male former smoker initially consulted with our practice on 06/18/2018 when inpatient following a left inguinal hernia repair where patient became hypoxic postoperative with difficulties with sedation.  Requiring PCCM consult.  Patient with known severe COPD.  PMH: HTN, PVD, NSCLC s/p SBRT to left lung, EtOH abuse, Recurrent inguinal hernia Smoker/ Smoking History: Former Smoker. Stopped Jan 2020.  Maintenance:  Pulmicort, Duonebs q4hr Pt of: Dr. Vaughan Browner  Recent [facility] Encounters:   06/18/2018-consult note- Bowser/Mannam >>>Severe COPD, needs pulmonary function testing, EtOH abuse, hypertension management needed Recent hospitalization timeline of events: 1/5 Admission 1/10 L inguinal hernia repair. Transfer to SDU for hypoxia post-operative >>>Dr. Harlow Asa (06/18/18) - Open left inguinal hernia repair 1/11 mild sedation from ativan, on 5L  1/15 Confusion overnight > started on steroids 1/14, started on TNA 1/22: Ongoing abdominal ileus versus obstruction.  Surgical service is planning bringing back for exploration.  Pulmonary asked to see in regards to abnormal CT findings specifically right middle lobe collapse/atelectasis and persistent bilateral effusions and basilar atelectasis >>>Dr. Marlou Starks (06/30/18) - EXPLORATORY LAPAROTOMY LYSISIS OF ADHESIONS (N/A) 1/23: Awake, interactive, will be receiving transfusion today 1/25: Blood pressure trending higher, low-grade temps 1/26-weaning sedation, tolerating pressure support 1/26-self extubated, tolerated 07/12/2018-discharged Follow-up in 3 weeks with Dr. Tammi Klippel with radiation oncology Follow-up with Dr. [name] primary care in 1 to 2 weeks Follow-up with Dr. Harlow Asa on 07/14/2018 Follow-up with Dr.  Scot Dock in 1 month Follow up with Pulmonary outpatient       07/26/2018  - Visit   68 year old male former smoker (quit in January/2020 during recent hospitalization) presenting to our office today for a hospital follow-up.  Patient was consulted with our practice while inpatient following surgery.  Since discharge patient has not resumed smoking.  Patient also has not been drinking.  Patient reports that he has been adherent to his budesonide nebulized medication.  Unfortunately the patient was not been taking his DuoNeb nebulized medication as he was confused on how to take these.  Unfortunately the patient has not been rinsing his mouth out after using his budesonide nebulized last medications as well.  Patient reports clinical improvement since hospital discharge.   Patient reports he is completed follow-up with surgical team.  He has a scheduled follow-up with primary care later on this week.  He also has scheduled follow-ups upcoming with vascular and radiation oncology.  MMRC - Breathlessness Score 2 - on level ground, I walk slower than people of the same age because of breathlessness, or have to stop for breathe when walking to my own pace       Tests:  COPD - PFT's  11/17/14 FEV1 1/25 (49 % ) ratio 49  with DLCO  35 % corrects to 43 % for alv volume and classic copd curvature to f/v   1/5 CT a/p >> bilatera hiatal hernia, distal small bowel obstruction. Transition point located within L inguinal hernia. R hiatal hernia containing ascites  1/10 CXR >> RLL opacity 1/25 chronic right lower lobe infiltrate, tube appropriately positioned  07/06/2018-swallow study- mild aspiration risk Pharyngeal swallow strong but timing slightly impaired resulting in silent laryngeal penetration and trace aspiration of thin mixed with secretions, chin tuck posture did not prevent aspiration Dysphasia 3 mechanical soft  Sit upright at 90 degrees, remain semiupright after feeds and meals Home meds  with pure     FENO:  No results found for: NITRICOXIDE  PFT: PFT Results Latest Ref Rng & Units 10/30/2014  FVC-Pre L 2.54  FVC-Predicted Pre % 74  Pre FEV1/FVC % % 49  FEV1-Pre L 1.25  FEV1-Predicted Pre % 49  DLCO UNC% % 35  DLCO COR %Predicted % 43  TLC L 6.76  TLC % Predicted % 125  RV % Predicted % 217    Imaging: Ct Abdomen Pelvis Wo Contrast  Result Date: 06/29/2018 CLINICAL DATA:  Abdominal distention. Status post open recurrent left inguinal hernia repair on 06/18/2018 for incarcerated small bowel and release of small bowel obstruction. EXAM: CT ABDOMEN AND PELVIS WITHOUT CONTRAST TECHNIQUE: Multidetector CT imaging of the abdomen and pelvis was performed following the standard protocol without IV contrast. COMPARISON:  06/21/2018.  06/13/2018. FINDINGS: Lower chest: Emphysema noted in the lung bases with calcified granulomata in the lower lungs bilaterally. Persistent right middle lobe collapse. Small right pleural effusion evident. There is a hiatal hernia with circumferential wall thickening noted in the distal esophagus. Hepatobiliary: No focal abnormality in the liver on this study without intravenous contrast. Gallbladder unremarkable. No intrahepatic or extrahepatic biliary dilation. Pancreas: Sequelae of chronic pancreatitis again noted. Spleen: No splenomegaly. No focal mass lesion. Adrenals/Urinary Tract: No adrenal nodule or mass. No hydronephrosis or gross mass lesion evident in either kidney. No evidence for hydroureter. The urinary bladder appears normal for the degree of distention. Stomach/Bowel: Contrast material is visualized in the nondistended stomach. Duodenum is patulous. Small bowel loops in the abdomen or fluid-filled markedly dilated measuring up to 5.5 cm diameter in the left lower quadrant. This is similar to mildly progressive compared to 06/21/2018. Air and stool is seen scattered along the length of the nondilated colon. No evidence for bowel wall  thickening or pneumatosis. No intraperitoneal free air. Fluid is visible in the right groin hernia, but sagittal imaging suggests that this fluid is not within bowel and that bowel has not extended into the right groin hernia (see image 39/series 5). Vascular/Lymphatic: There is abdominal aortic atherosclerosis without aneurysm. There is no gastrohepatic or hepatoduodenal ligament lymphadenopathy. No intraperitoneal or retroperitoneal lymphadenopathy. No pelvic sidewall lymphadenopathy. Reproductive: Stable 1.7 cm low-density lesion along the posterior prostate gland. This appears 2 posterior to be a TURP defect. Prostate cyst or prostatic abscess could have this appearance. Other: No substantial intraperitoneal free fluid. Musculoskeletal: No worrisome lytic or sclerotic osseous abnormality. IMPRESSION: 1. Marked relatively diffuse fluid-filled dilated small bowel measuring up to 5.5 cm diameter and compatible with small bowel obstruction. This is stable to mildly progressed comparing to 06/21/2018. A discrete transition zone is not identified on today's study. There is no small bowel wall thickening or small bowel pneumatosis. No intraperitoneal free air and no substantial intraperitoneal free fluid. 2. Right groin hernia contains fluid, but this does not appear to be fluid-filled small bowel on sagittal imaging but instead suggests a small fluid collection in the hernia sac with adjacent small bowel loop bulging in the direction of the fascial defect. Given the lack of intravenous and oral contrast, slight herniation of small bowel into the hernia sac is not entirely excluded, but in either case this does not appear to be the site of obstruction. 3. Stable 1.7 cm low-density collection posterior prostate gland comparing back to 04/08/2016. 4. Circumferential wall thickening distal esophagus with associated hiatal hernia. Esophagitis would be a consideration.  5. Chronic right middle lobe collapse with small right  pleural effusion, decreased in the interval. 6. Sequelae of chronic pancreatitis. 7.  Aortic Atherosclerois (ICD10-170.0) 8.  Emphysema. ([id number]) Electronically Signed   By: Misty Stanley M.D.   On: 06/29/2018 15:55   Dg Chest 2 View  Result Date: 07/26/2018 CLINICAL DATA:  COPD.  Hypertension. EXAM: CHEST - 2 VIEW COMPARISON:  July 05, 2018 chest radiograph and chest CT June 21, 2018 FINDINGS: The lungs are hyperexpanded. There are apparent nipple shadows bilaterally. There is scarring in the left upper lobe region. There is apical pleural thickening on the left. There is no edema or consolidation. Heart size and pulmonary vascularity are within normal limits. No adenopathy. There is aortic atherosclerosis. No bone lesions. IMPRESSION: Lungs hyperexpanded with left upper lobe scarring. Apparent nipple shadows bilaterally. No edema or consolidation. Heart size within normal limits. Aortic Atherosclerosis (ICD10-I70.0). Electronically Signed   By: Lowella Grip III M.D.   On: 07/26/2018 16:15   Dg Abd 1 View  Result Date: 06/30/2018 CLINICAL DATA:  Small bowel obstruction EXAM: ABDOMEN - 1 VIEW COMPARISON:  Yesterday FINDINGS: The orogastric tube has been removed. Marked distension of small bowel, measuring up to 7 cm. Relative paucity of colonic gas and right colonic stool. No concerning mass effect. No gross pneumatosis. Atherosclerosis and right iliac stenting. Chronic calcified pancreatitis. Small right pleural effusion at the posterior costophrenic sulcus. IMPRESSION: 1. Continued small bowel obstruction with marked small bowel distension. 2. The enteric tube has been removed. Electronically Signed   By: Monte Fantasia M.D.   On: 06/30/2018 07:37   Dg Chest Port 1 View  Result Date: 07/05/2018 CLINICAL DATA:  67 year old male respiratory failure. EXAM: PORTABLE CHEST 1 VIEW COMPARISON:  07/03/2018 chest x-ray.  06/21/2018 chest CT. FINDINGS: Endotracheal tube has been removed. Left  PICC line tip distal superior vena cava level unchanged. Nasogastric tube side hole just beyond the gastroesophageal junction. Tip not imaged on the current exam. Bilateral pleural effusions possibly with basilar atelectasis and/or infiltrate. Consolidation right middle lobe and lingula unchanged (as noted on prior CT). Heart size within normal limits.  Calcified aorta. IMPRESSION: 1. Endotracheal tube has been removed. 2. Otherwise no significant change including bilateral pleural effusions possibly with basilar atelectasis and/or infiltrate. Consolidation right middle lobe and lingula unchanged (as noted on prior CT). 3.  Aortic Atherosclerosis (ICD10-I70.0). Electronically Signed   By: Genia Del M.D.   On: 07/05/2018 07:20   Dg Chest Port 1 View  Result Date: 07/03/2018 CLINICAL DATA:  Pneumonia. Endotracheal tube. EXAM: PORTABLE CHEST 1 VIEW COMPARISON:  Chest x-ray dated 06/30/2018. FINDINGS: Endotracheal tube is stable in position with tip approximately 4 cm above the level of the carina. LEFT-sided PICC line is well positioned with tip at the level of the lower SVC/cavoatrial junction. Enteric tube appears adequately positioned in the stomach. Heart size and mediastinal contours are within normal limits. Atherosclerotic changes noted at the aortic arch. Persistent RIGHT middle lobe opacity. Mild scarring/atelectasis within the bilateral mid lung regions. Lungs otherwise clear. No pleural effusion or pneumothorax seen. IMPRESSION: 1. Endotracheal tube is well positioned with tip approximately 4 cm above the level of the carina. 2. Otherwise stable chest x-ray. Persistent RIGHT middle lobe opacity corresponds to a chronic appearing RIGHT middle lobe atelectasis demonstrated on chest CT of 06/21/2018. No evidence of active pneumonia or pulmonary edema on today's exam. Electronically Signed   By: Franki Cabot M.D.   On: 07/03/2018 05:30  Portable Chest X-ray  Result Date: 06/30/2018 CLINICAL DATA:   Status post ET tube placement EXAM: PORTABLE CHEST 1 VIEW COMPARISON:  06/20/2018 FINDINGS: Endotracheal tube tip is situated above the carina. There is a enteric 2 with side port below the level of the GE junction. Lungs appear hyperinflated. Interval improvement in bilateral pleural effusions. Persistent airspace opacity overlying the medial right lower lobe. IMPRESSION: 1. ETT tip is above the carina. 2. Persistent rounded opacity overlying the medial right lower lobe. 3. Improvement in bilateral pleural effusions. Electronically Signed   By: Kerby Moors M.D.   On: 06/30/2018 14:18   Dg Abd 2 Views  Result Date: 06/27/2018 CLINICAL DATA:  Small bowel obstruction. EXAM: ABDOMEN - 2 VIEW COMPARISON:  June 25, 2018 FINDINGS: Small pleural effusions. Lung bases otherwise unchanged. No free air, portal venous gas or pneumatosis. Continued small bowel dilatation. There is some air in the colon. No other change. IMPRESSION: Continued small bowel obstruction. There is some air in the colon suggesting the obstruction is either early or partial. Electronically Signed   By: Dorise Bullion III M.D   On: 06/27/2018 14:32   Dg Abd Portable 1v  Result Date: 06/29/2018 CLINICAL DATA:  NG tube placement. EXAM: PORTABLE ABDOMEN - 1 VIEW COMPARISON:  Body CT 06/29/2018 FINDINGS: Enteric catheter with side hole at the expected level of the GE junction. Diffuse gaseous distension of the abdomen. PICC line in stable position. IMPRESSION: Enteric catheter with side hole at the expected location of the GE junction. Electronically Signed   By: Fidela Salisbury M.D.   On: 06/29/2018 19:20   Dg Swallowing Func-speech Pathology  Result Date: 07/06/2018 Objective Swallowing Evaluation: Type of Study: MBS-Modified Barium Swallow Study  Patient Details Name: Alexiz Sustaita MRN: [id number] Date of Birth: [birth year] Today's Date: 07/06/2018 Time: SLP Start Time (ACUTE ONLY): 0810 -SLP Stop Time (ACUTE ONLY): 0830 SLP Time  Calculation (min) (ACUTE ONLY): 20 min Past Medical History: Past Medical History: Diagnosis Date . COPD (chronic obstructive pulmonary disease) ([place])  . Hypertension  . Iliac artery stenosis, right (HCC)   80% Stenosis . 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[place])  . Lower limb ischemia 03/26/2018 . Lung cancer dx'd 12/2014 . Pancreatitis   x2 stents placed to make patent duct to pancreas . Pulmonary hypertension ([place])  . Severe claudication ([place])  . Status post left foot surgery  . Tobacco abuse  Past Surgical History: Past Surgical History: Procedure Laterality Date . ABDOMINAL AORTOGRAM W/LOWER EXTREMITY N/A 03/30/2018  Procedure: ABDOMINAL AORTOGRAM W/LOWER EXTREMITY;  Surgeon: Nigel Mormon, MD;  Location: [place] [facility];  Service: Cardiovascular;  Laterality: N/A; . COLECTOMY   . COLOSTOMY   . COLOSTOMY REVERSAL   . EUS N/A 12/14/2014  Procedure: UPPER ENDOSCOPIC ULTRASOUND (EUS) LINEAR;  Surgeon: Milus Banister, MD;  Location: [facility];  Service: Endoscopy;  Laterality: N/A; . FOOT SURGERY Left 2006 . INGUINAL HERNIA REPAIR Left 06/18/2018  Procedure: OPEN RECURRENT LEFT INGUINAL HERNIA REPAIR WITH MESH;  Surgeon: Armandina Gemma, MD;  Location: [facility];  Service: General;  Laterality: Left; . INSERTION OF MESH Left 06/18/2018  Procedure: INSERTION OF MESH;  Surgeon: Armandina Gemma, MD;  Location: [facility];  Service: General;  Laterality: Left; . LAPAROTOMY N/A 06/30/2018  Procedure: EXPLORATORY LAPAROTOMY LYSISIS OF ADHESIONS;  Surgeon: Jovita Kussmaul, MD;  Location: [facility];  Service: General;  Laterality: N/A; . PANCREAS SURGERY  2007 . PERIPHERAL VASCULAR INTERVENTION  03/30/2018  Procedure: PERIPHERAL VASCULAR INTERVENTION;  Surgeon: Vernell Leep  J, MD;  Location: [facility] [facility];  Service: Cardiovascular;;  Rt Iliac . UPPER GI ENDOSCOPY   . VENTRAL HERNIA REPAIR  1967 HPI: Lecil is a 68 yo male adm to [facility] with abdomen pain Has h/o COPD, lung cancer 2016 and 2019 - s/p radiation tx in 2019 (single tx per  wife).  Pt also with h/o pancreatitis.  Pt required surgery (bowel obstruction) and was intubated 1/22-1/26/20 when he self extubated.  In addition, he also had NG which he self removed also.  Swallow eval ordered.  Pt denies dysphagia from XRT or prior to admit.  Subjective: pt awake in bed, wants to drink Assessment / Plan / Recommendation CHL IP CLINICAL IMPRESSIONS 07/06/2018 Clinical Impression Pt presents with mild oropharyngeal dysphagia characterized by decreased oral coordination/bolus cohesion due to weakness and delay transiting, premature spillage.  Pharyngeal swallow is strong but timing slightly impaired resulting in mild silent laryngeal penetration/trace aspiration of thin mixed with secretions.  Chin tuck posture did not prevent aspiration, however small single sips protective.  Defer to CCS for diet - ? Clears initiate?  Medicine with puree *applesauce* whole- start and follow with liquids.  Will follow up briefly for dysphagia management and pt/family education.   SLP Visit Diagnosis Dysphagia, oropharyngeal phase (R13.12) Attention and concentration deficit following -- Frontal lobe and executive function deficit following -- Impact on safety and function Mild aspiration risk   CHL IP TREATMENT RECOMMENDATION 07/06/2018 Treatment Recommendations Therapy as outlined in treatment plan below   Prognosis 07/06/2018 Prognosis for Safe Diet Advancement Good Barriers to Reach Goals Other (Comment) Barriers/Prognosis Comment -- CHL IP DIET RECOMMENDATION 07/06/2018 SLP Diet Recommendations Dysphagia 3 (Mech soft) solids;Thin liquid Liquid Administration via Cup Medication Administration Whole meds with puree Compensations Minimize environmental distractions;Slow rate;Small sips/bites Postural Changes Seated upright at 90 degrees;Remain semi-upright after after feeds/meals (Comment)   CHL IP OTHER RECOMMENDATIONS 07/06/2018 Recommended Consults -- Oral Care Recommendations Oral care BID Other Recommendations  Clarify dietary restrictions   CHL IP FOLLOW UP RECOMMENDATIONS 07/06/2018 Follow up Recommendations None   CHL IP FREQUENCY AND DURATION 07/06/2018 Speech Therapy Frequency (ACUTE ONLY) min 1 x/week Treatment Duration 1 week      CHL IP ORAL PHASE 07/06/2018 Oral Phase Impaired Oral - Pudding Teaspoon -- Oral - Pudding Cup -- Oral - Honey Teaspoon -- Oral - Honey Cup -- Oral - Nectar Teaspoon -- Oral - Nectar Cup Premature spillage Oral - Nectar Straw -- Oral - Thin Teaspoon Premature spillage Oral - Thin Cup Premature spillage Oral - Thin Straw Premature spillage Oral - Puree Reduced posterior propulsion;Weak lingual manipulation;Lingual pumping Oral - Mech Soft -- Oral - Regular Impaired mastication Oral - Multi-Consistency -- Oral - Pill -- Oral Phase - Comment pharyngeal swallow strong  CHL IP PHARYNGEAL PHASE 07/06/2018 Pharyngeal Phase Impaired Pharyngeal- Pudding Teaspoon -- Pharyngeal -- Pharyngeal- Pudding Cup -- Pharyngeal -- Pharyngeal- Honey Teaspoon -- Pharyngeal -- Pharyngeal- Honey Cup -- Pharyngeal -- Pharyngeal- Nectar Teaspoon -- Pharyngeal -- Pharyngeal- Nectar Cup WFL Pharyngeal -- Pharyngeal- Nectar Straw -- Pharyngeal -- Pharyngeal- Thin Teaspoon WFL Pharyngeal -- Pharyngeal- Thin Cup Penetration/Aspiration during swallow Pharyngeal Material enters airway, passes BELOW cords without attempt by patient to eject out (silent aspiration);Material enters airway, remains ABOVE vocal cords then ejected out Pharyngeal- Thin Straw Delayed swallow initiation-pyriform sinuses;Delayed swallow initiation-vallecula;Penetration/Aspiration during swallow Pharyngeal Material enters airway, passes BELOW cords without attempt by patient to eject out (silent aspiration) Pharyngeal- Puree WFL Pharyngeal -- Pharyngeal- Mechanical Soft WFL Pharyngeal --  Pharyngeal- Regular -- Pharyngeal -- Pharyngeal- Multi-consistency -- Pharyngeal -- Pharyngeal- Pill -- Pharyngeal -- Pharyngeal Comment --  CHL IP CERVICAL ESOPHAGEAL  PHASE 07/06/2018 Cervical Esophageal Phase Impaired Pudding Teaspoon -- Pudding Cup -- Honey Teaspoon -- Honey Cup -- Nectar Teaspoon -- Nectar Cup -- Nectar Straw -- Thin Teaspoon -- Thin Cup -- Thin Straw -- Puree -- Mechanical Soft -- Regular -- Multi-consistency -- Pill -- Cervical Esophageal Comment appearance of prominent cricopharyngeus Macario Golds 07/06/2018, 8:57 AM    Luanna Salk, MS [facility] SLP Acute Rehab Services Pager [phone] Office [phone]           [place] Ekg Site Rite  Result Date: 06/30/2018 If Site Rite image not attached, placement could not be confirmed due to current cardiac rhythm.     Specialty Problems      Pulmonary Problems   Cancer of upper lobe of left lung (HCC)   Pneumothorax of left lung after biopsy   Pneumothorax, traumatic   COPD (chronic obstructive pulmonary disease) (HCC)    COPD - PFT's  11/17/14 FEV1 1/25 (49 % ) ratio 49  with DLCO  35 % corrects to 43 % for alv volume and classic copd curvature to f/v        Primary cancer of right upper lobe of lung (HCC)   Acute hypoxemic respiratory failure (HCC)      Allergies  Allergen Reactions  . Chantix [Varenicline] Other (See Comments)    Physically ill  . Prevacid [Lansoprazole] Itching    "swell up with gas"    Immunization History  Administered Date(s) Administered  . Influenza, High Dose Seasonal PF 04/29/2018    Flu shot in October /2019 - [facility]  Pneumonia vaccines - [facility] in [place]   Past Medical History:  Diagnosis Date  . COPD (chronic obstructive pulmonary disease) ([place])   . Hypertension   . Iliac artery stenosis, right (HCC)    80% Stenosis  . 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[place])   . Lower limb ischemia 03/26/2018  . Lung cancer dx'd 12/2014  . Pancreatitis    x2 stents placed to make patent duct to pancreas  . Pulmonary hypertension ([place])   . Severe claudication ([place])   . Status post left foot surgery   . Tobacco abuse     Tobacco History: Social History    Tobacco Use  Smoking Status Former Smoker  . Packs/day: 1.00  . Years: 50.00  . Pack years: 50.00  . Types: Cigarettes  Smokeless Tobacco Never Used  Tobacco Comment   Stopped when patient was hospitalized   Counseling given: Not Answered Comment: Stopped when patient was hospitalized  Continue to not smoke   Outpatient Encounter Medications as of 07/26/2018  Medication Sig  . acetaminophen (TYLENOL) 325 MG tablet Take 2 tablets (650 mg total) by mouth every 6 (six) hours as needed.  Marland Kitchen albuterol (PROVENTIL) (2.5 MG/3ML) 0.083% nebulizer solution Take 3 mLs (2.5 mg total) by nebulization every 2 (two) hours as needed for wheezing or shortness of breath.  Marland Kitchen amLODipine (NORVASC) 10 MG tablet Take 10 mg by mouth every morning.   . budesonide (PULMICORT) 0.5 MG/2ML nebulizer solution Take 2 mLs (0.5 mg total) by nebulization 2 (two) times daily.  Marland Kitchen buPROPion (WELLBUTRIN XL) 150 MG 24 hr tablet Take 150 mg by mouth every morning.   . clopidogrel (PLAVIX) 75 MG tablet Take 1 tablet (75 mg total) by mouth daily.  Marland Kitchen dextromethorphan-guaiFENesin (MUCINEX DM) 30-600 MG 12hr tablet Take 1-2 tablets  by mouth 2 (two) times daily as needed for cough.  . hydrocortisone-pramoxine (ANALPRAM-HC) 2.5-1 % rectal cream Place rectally 4 (four) times daily.  Marland Kitchen ipratropium-albuterol (DUONEB) 0.5-2.5 (3) MG/3ML SOLN Take 3 mLs by nebulization 3 (three) times daily.  . metoprolol succinate (TOPROL-XL) 50 MG 24 hr tablet Take 1 tablet (50 mg total) by mouth every morning.  . Multiple Vitamin (MULTIVITAMIN WITH MINERALS) TABS tablet Take 1 tablet by mouth every morning.  . Nutritional Supplements (CARNATION BREAKFAST ESSENTIALS PO) Take 1 Container by mouth 2 (two) times daily.   Marland Kitchen saccharomyces boulardii (FLORASTOR) 250 MG capsule Take 1 capsule (250 mg total) by mouth 2 (two) times daily. You can get a probiotic over the counter.  . [DISCONTINUED] budesonide (PULMICORT) 0.5 MG/2ML nebulizer solution Take 2 mLs  (0.5 mg total) by nebulization 2 (two) times daily.  . [DISCONTINUED] ipratropium-albuterol (DUONEB) 0.5-2.5 (3) MG/3ML SOLN Take 3 mLs by nebulization 3 (three) times daily.  . [DISCONTINUED] traMADol (ULTRAM) 50 MG tablet Take 1 tablet (50 mg total) by mouth every 6 (six) hours as needed for severe pain. (Patient not taking: Reported on 07/26/2018)  . [DISCONTINUED] triamcinolone (KENALOG) 0.025 % cream Apply 1 application topically 2 (two) times daily. Apply to face  . [DISCONTINUED] triamcinolone ointment (KENALOG) 0.1 % Apply 1 application topically 2 (two) times daily. Apply to legs and ears   No facility-administered encounter medications on file as of 07/26/2018.      Review of Systems  Review of Systems  Constitutional: Positive for fatigue. Negative for activity change, appetite change and fever.  HENT: Negative for congestion, sinus pressure and sinus pain.   Respiratory: Positive for cough and wheezing. Negative for chest tightness and shortness of breath.   Cardiovascular: Negative for chest pain and palpitations.  Gastrointestinal: Negative for diarrhea, nausea and vomiting.  Neurological: Negative for headaches.  Psychiatric/Behavioral: Negative for dysphoric mood. The patient is not nervous/anxious.      Physical Exam  BP 120/68 (BP Location: Left Arm, Patient Position: Sitting, Cuff Size: Small)   Pulse 72   Ht 5\' 2"  (1.575 m)   Wt 76 lb (34.5 kg)   SpO2 98%   BMI 13.90 kg/m   Wt Readings from Last 5 Encounters:  07/26/18 76 lb (34.5 kg)  07/10/18 81 lb 5.6 oz (36.9 kg)  04/07/18 87 lb (39.5 kg)  03/30/18 87 lb (39.5 kg)  11/23/17 98 lb (44.5 kg)   Discussed weight loss with patient today.  Emphasized the importance that he continue to use high-protein nutrient dense foods  Physical Exam  Constitutional: He is oriented to person, place, and time and well-developed, well-nourished, and in no distress. Vital signs are normal. He appears cachectic. No distress.    + Thin cachectic elderly male  HENT:  Head: Normocephalic and atraumatic.  Right Ear: Hearing and external ear normal.  Left Ear: Hearing and external ear normal.  Nose: Mucosal edema present. Right sinus exhibits no maxillary sinus tenderness and no frontal sinus tenderness. Left sinus exhibits no maxillary sinus tenderness and no frontal sinus tenderness.  Mouth/Throat: Uvula is midline and oropharynx is clear and moist. He has dentures. Abnormal dentition. No oropharyngeal exudate.  + Cerumen in both canals bilaterally, difficult to visualize TMs  Eyes: Pupils are equal, round, and reactive to light.  Neck: Normal range of motion. Neck supple. No JVD present.  Cardiovascular: Normal rate, regular rhythm and normal heart sounds.  Pulmonary/Chest: Effort normal and breath sounds normal. No accessory muscle usage. No  respiratory distress. He has no decreased breath sounds. He has no wheezes. He has no rhonchi.  Musculoskeletal: Normal range of motion.        General: No edema.  Lymphadenopathy:    He has no cervical adenopathy.  Neurological: He is alert and oriented to person, place, and time. Gait normal.  Skin: Skin is warm and dry. He is not diaphoretic. No erythema.  Psychiatric: Mood, memory, affect and judgment normal.  Nursing note and vitals reviewed.   SIX MIN WALK 07/26/2018  Tech Comments: Patient had a normal paced walk no sob noted he went 255ft but wanted to stop due to feeling unsteady.  ;  Lab Results:  CBC    Component Value Date/Time   WBC 4.7 07/07/2018 0525   RBC 3.04 (L) 07/07/2018 0525   HGB 9.6 (L) 07/07/2018 0525   HGB 14.0 11/20/2014 1352   HCT 29.8 (L) 07/07/2018 0525   HCT 42.2 11/20/2014 1352   PLT 187 07/07/2018 0525   PLT 206 11/20/2014 1352   MCV 98.0 07/07/2018 0525   MCV 93.2 11/20/2014 1352   MCH 31.6 07/07/2018 0525   MCHC 32.2 07/07/2018 0525   RDW 15.2 07/07/2018 0525   RDW 15.2 (H) 11/20/2014 1352   LYMPHSABS 0.5 (L) 07/05/2018 0426    LYMPHSABS 1.2 11/20/2014 1352   MONOABS 0.7 07/05/2018 0426   MONOABS 0.8 11/20/2014 1352   EOSABS 0.0 07/05/2018 0426   EOSABS 0.2 11/20/2014 1352   BASOSABS 0.0 07/05/2018 0426   BASOSABS 0.1 11/20/2014 1352    BMET    Component Value Date/Time   NA 131 (L) 07/08/2018 0431   NA 137 05/11/2017 1437   NA 133 (L) 10/06/2016 1220   K 3.6 07/08/2018 0431   K 4.7 10/06/2016 1220   CL 94 (L) 07/08/2018 0431   CO2 30 07/08/2018 0431   CO2 26 10/06/2016 1220   GLUCOSE 136 (H) 07/08/2018 0431   GLUCOSE 99 10/06/2016 1220   BUN 24 (H) 07/08/2018 0431   BUN 8 05/11/2017 1437   BUN 12.0 10/06/2016 1220   CREATININE 0.52 (L) 07/08/2018 0431   CREATININE 0.8 10/06/2016 1220   CALCIUM 8.4 (L) 07/08/2018 0431   CALCIUM 10.1 10/06/2016 1220   GFRNONAA >60 07/08/2018 0431   GFRAA >60 07/08/2018 0431    BNP No results found for: BNP  ProBNP No results found for: PROBNP    Assessment & Plan:     COPD (chronic obstructive pulmonary disease) ([place]) Assessment: COPD Gold 3 on 2016 pulmonary function test DLCO 35 and 2016 pulmonary function test mMRC 2 today Patient reports adherence to budesonide twice daily Patient has not been rinsing mouth out after use of budesonide Thin cachectic elderly male BMI 13.9 Lungs clear to auscultation today Walk in office today patient was able to complete 250 feet on room air without having oxygen desaturations  Plan: Chest x-ray today Schedule pulmonary function tests to be completed in 8 weeks Continue budesonide twice daily Rinse mouth out after use of budesonide DuoNeb nebulized medication every 4 hours as needed for shortness of breath and wheezing We will contact [place] to update immunization status of flu vaccine and pneumonia vaccine Walk in office today patient did not have any desaturations Referral to [facility] care management for COPD as well as severe protein malnutrition Continue physical therapy as ordered since  hospital discharge Could consider pulmonary rehab in the future Emphasized the importance of the patient drink Ensure or high-protein boost and other nutrient  dense foods as snacks in addition to regular meals   Protein-calorie malnutrition, severe (HCC) Assessment: BMI 13.9 Thin cachectic male on exam today Continued weight loss over the last 6 months  Plan: Referral to teach and care management for management of severe protein malnutrition Continue high-protein boost or Ensure shakes as snacks in between meals Continue to focus on nutrient dense foods to increase weight gain    Former smoker Assessment: Currently not smoking Stop smoking during inpatient hospitalization  Plan: Continue to not smoke  Alcoholism ([place]) Assessment: Patient reports that he is currently not drinking  Plan: Continue to not drink     Lauraine Rinne, NP 07/26/2018   This appointment was 35 min long with over 50% of the time in direct face-to-face patient care, assessment, plan of care, and follow-up.

## 2018-07-26 ENCOUNTER — Ambulatory Visit (INDEPENDENT_AMBULATORY_CARE_PROVIDER_SITE_OTHER): Payer: Medicare Other | Admitting: Pulmonary Disease

## 2018-07-26 ENCOUNTER — Encounter: Payer: Self-pay | Admitting: Pulmonary Disease

## 2018-07-26 ENCOUNTER — Ambulatory Visit (INDEPENDENT_AMBULATORY_CARE_PROVIDER_SITE_OTHER)
Admission: RE | Admit: 2018-07-26 | Discharge: 2018-07-26 | Disposition: A | Discharge status: Home / Self Care | Payer: Medicare Other | Source: Ambulatory Visit | Attending: Pulmonary Disease | Admitting: Pulmonary Disease

## 2018-07-26 VITALS — BP 120/68 | HR 72 | Ht 62.0 in | Wt 76.0 lb

## 2018-07-26 DIAGNOSIS — F1020 Alcohol dependence, uncomplicated: Secondary | ICD-10-CM

## 2018-07-26 DIAGNOSIS — Z87891 Personal history of nicotine dependence: Secondary | ICD-10-CM | POA: Diagnosis not present

## 2018-07-26 DIAGNOSIS — J431 Panlobular emphysema: Secondary | ICD-10-CM

## 2018-07-26 DIAGNOSIS — E43 Unspecified severe protein-calorie malnutrition: Secondary | ICD-10-CM | POA: Diagnosis not present

## 2018-07-26 DIAGNOSIS — I10 Essential (primary) hypertension: Secondary | ICD-10-CM | POA: Diagnosis not present

## 2018-07-26 MED ORDER — IPRATROPIUM-ALBUTEROL 0.5-2.5 (3) MG/3ML IN SOLN: 3.0000 mL | Freq: Three times a day (TID) | RESPIRATORY_TRACT | 0 refills | Status: DC

## 2018-07-26 MED ORDER — BUDESONIDE 0.5 MG/2ML IN SUSP: 0.5000 mg | Freq: Two times a day (BID) | RESPIRATORY_TRACT | 1 refills | Status: DC

## 2018-07-26 NOTE — Assessment & Plan Note (Signed)
Assessment: COPD Gold 3 on 2016 pulmonary function test DLCO 35 and 2016 pulmonary function test mMRC 2 today Patient reports adherence to budesonide twice daily Patient has not been rinsing mouth out after use of budesonide Thin cachectic elderly male BMI 13.9 Lungs clear to auscultation today Walk in office today patient was able to complete 250 feet on room air without having oxygen desaturations  Plan: Chest x-ray today Schedule pulmonary function tests to be completed in 8 weeks Continue budesonide twice daily Rinse mouth out after use of budesonide DuoNeb nebulized medication every 4 hours as needed for shortness of breath and wheezing We will contact [place] to update immunization status of flu vaccine and pneumonia vaccine Walk in office today patient did not have any desaturations Referral to [facility] care management for COPD as well as severe protein malnutrition Continue physical therapy as ordered since hospital discharge Could consider pulmonary rehab in the future Emphasized the importance of the patient drink Ensure or high-protein boost and other nutrient dense foods as snacks in addition to regular meals

## 2018-07-26 NOTE — Patient Instructions (Addendum)
Walk today in office to assess oxygen levels  Chest Xray today   Continue budesonide nebulized medications every 12 hours >>> You do this no matter what >>> Rinse mouth out after use  Continue DuoNeb nebulized medications every 6-8 hours and  Congratulations on stopping smoking  We will contact the [place] in [place] to update your immunization records  Note your daily symptoms > remember "red flags" for COPD:   >>>Increase in cough >>>increase in sputum production >>>increase in shortness of breath or activity  intolerance.   If you notice these symptoms, please call the office to be seen.    Keep scheduled follow-up with radiation oncology Dr. Tammi Klippel Keep scheduled follow-up with primary care Dr. [name] Keep regular follow-up with surgery team Keep regular scheduled follow up with vascular    I have ordered pulmonary function testing for you to complete prior to your next office visit with Dr. Vaughan Browner  Follow-up with Dr. Vaughan Browner in 8 weeks     It is flu season:   >>>Remember to be washing your hands regularly, using hand sanitizer, be careful to use around herself with has contact with people who are sick will increase her chances of getting sick yourself. >>> Best ways to protect herself from the flu: Receive the yearly flu vaccine, practice good hand hygiene washing with soap and also using hand sanitizer when available, eat a nutritious meals, get adequate rest, hydrate appropriately   Please contact the office if your symptoms worsen or you have concerns that you are not improving.   Thank you for choosing [facility] for your healthcare, and for allowing [place] to partner with you on your healthcare journey. I am thankful to be able to provide care to you today.   Wyn Quaker FNP-C

## 2018-07-26 NOTE — Assessment & Plan Note (Signed)
Assessment: Currently not smoking Stop smoking during inpatient hospitalization  Plan: Continue to not smoke

## 2018-07-26 NOTE — Assessment & Plan Note (Signed)
Assessment: BMI 13.9 Thin cachectic male on exam today Continued weight loss over the last 6 months  Plan: Referral to teach and care management for management of severe protein malnutrition Continue high-protein boost or Ensure shakes as snacks in between meals Continue to focus on nutrient dense foods to increase weight gain

## 2018-07-26 NOTE — Assessment & Plan Note (Signed)
Assessment: Patient reports that he is currently not drinking  Plan: Continue to not drink

## 2018-07-27 DIAGNOSIS — R5381 Other malaise: Secondary | ICD-10-CM | POA: Diagnosis not present

## 2018-07-27 DIAGNOSIS — R0602 Shortness of breath: Secondary | ICD-10-CM | POA: Diagnosis not present

## 2018-07-27 NOTE — Progress Notes (Signed)
Chest x-ray results are improved.  This is good news.  No additional recommendations.  Keep follow-up with our office as planned  Wyn Quaker, FNP

## 2018-07-28 DIAGNOSIS — Z7951 Long term (current) use of inhaled steroids: Secondary | ICD-10-CM | POA: Diagnosis not present

## 2018-07-28 DIAGNOSIS — E43 Unspecified severe protein-calorie malnutrition: Secondary | ICD-10-CM | POA: Diagnosis not present

## 2018-07-28 DIAGNOSIS — Z95820 Peripheral vascular angioplasty status with implants and grafts: Secondary | ICD-10-CM | POA: Diagnosis not present

## 2018-07-28 DIAGNOSIS — R918 Other nonspecific abnormal finding of lung field: Secondary | ICD-10-CM | POA: Diagnosis not present

## 2018-07-28 DIAGNOSIS — K861 Other chronic pancreatitis: Secondary | ICD-10-CM | POA: Diagnosis not present

## 2018-07-28 DIAGNOSIS — I272 Pulmonary hypertension, unspecified: Secondary | ICD-10-CM | POA: Diagnosis not present

## 2018-07-28 DIAGNOSIS — C3412 Malignant neoplasm of upper lobe, left bronchus or lung: Secondary | ICD-10-CM | POA: Diagnosis not present

## 2018-07-28 DIAGNOSIS — Z48815 Encounter for surgical aftercare following surgery on the digestive system: Secondary | ICD-10-CM | POA: Diagnosis not present

## 2018-07-28 DIAGNOSIS — I10 Essential (primary) hypertension: Secondary | ICD-10-CM | POA: Diagnosis not present

## 2018-07-28 DIAGNOSIS — Z8719 Personal history of other diseases of the digestive system: Secondary | ICD-10-CM | POA: Diagnosis not present

## 2018-07-28 DIAGNOSIS — I739 Peripheral vascular disease, unspecified: Secondary | ICD-10-CM | POA: Diagnosis not present

## 2018-07-28 DIAGNOSIS — I743 Embolism and thrombosis of arteries of the lower extremities: Secondary | ICD-10-CM | POA: Diagnosis not present

## 2018-07-28 DIAGNOSIS — Z7902 Long term (current) use of antithrombotics/antiplatelets: Secondary | ICD-10-CM | POA: Diagnosis not present

## 2018-07-28 DIAGNOSIS — Z9889 Other specified postprocedural states: Secondary | ICD-10-CM | POA: Diagnosis not present

## 2018-07-28 DIAGNOSIS — I700 Atherosclerosis of aorta: Secondary | ICD-10-CM | POA: Diagnosis not present

## 2018-07-28 DIAGNOSIS — J439 Emphysema, unspecified: Secondary | ICD-10-CM | POA: Diagnosis not present

## 2018-07-28 DIAGNOSIS — D638 Anemia in other chronic diseases classified elsewhere: Secondary | ICD-10-CM | POA: Diagnosis not present

## 2018-07-28 DIAGNOSIS — M810 Age-related osteoporosis without current pathological fracture: Secondary | ICD-10-CM | POA: Diagnosis not present

## 2018-07-29 ENCOUNTER — Other Ambulatory Visit: Payer: Self-pay | Admitting: *Deleted

## 2018-07-29 DIAGNOSIS — I10 Essential (primary) hypertension: Secondary | ICD-10-CM | POA: Diagnosis not present

## 2018-07-29 DIAGNOSIS — K861 Other chronic pancreatitis: Secondary | ICD-10-CM | POA: Diagnosis not present

## 2018-07-29 DIAGNOSIS — Z7902 Long term (current) use of antithrombotics/antiplatelets: Secondary | ICD-10-CM | POA: Diagnosis not present

## 2018-07-29 DIAGNOSIS — J439 Emphysema, unspecified: Secondary | ICD-10-CM | POA: Diagnosis not present

## 2018-07-29 DIAGNOSIS — Z7951 Long term (current) use of inhaled steroids: Secondary | ICD-10-CM | POA: Diagnosis not present

## 2018-07-29 DIAGNOSIS — D638 Anemia in other chronic diseases classified elsewhere: Secondary | ICD-10-CM | POA: Diagnosis not present

## 2018-07-29 DIAGNOSIS — Z95820 Peripheral vascular angioplasty status with implants and grafts: Secondary | ICD-10-CM | POA: Diagnosis not present

## 2018-07-29 DIAGNOSIS — E43 Unspecified severe protein-calorie malnutrition: Secondary | ICD-10-CM | POA: Diagnosis not present

## 2018-07-29 DIAGNOSIS — I739 Peripheral vascular disease, unspecified: Secondary | ICD-10-CM | POA: Diagnosis not present

## 2018-07-29 DIAGNOSIS — Z48815 Encounter for surgical aftercare following surgery on the digestive system: Secondary | ICD-10-CM | POA: Diagnosis not present

## 2018-07-29 DIAGNOSIS — Z9889 Other specified postprocedural states: Secondary | ICD-10-CM | POA: Diagnosis not present

## 2018-07-29 DIAGNOSIS — C3412 Malignant neoplasm of upper lobe, left bronchus or lung: Secondary | ICD-10-CM | POA: Diagnosis not present

## 2018-07-29 DIAGNOSIS — R918 Other nonspecific abnormal finding of lung field: Secondary | ICD-10-CM | POA: Diagnosis not present

## 2018-07-29 DIAGNOSIS — Z8719 Personal history of other diseases of the digestive system: Secondary | ICD-10-CM | POA: Diagnosis not present

## 2018-07-29 DIAGNOSIS — I743 Embolism and thrombosis of arteries of the lower extremities: Secondary | ICD-10-CM | POA: Diagnosis not present

## 2018-07-29 DIAGNOSIS — I700 Atherosclerosis of aorta: Secondary | ICD-10-CM | POA: Diagnosis not present

## 2018-07-29 DIAGNOSIS — M810 Age-related osteoporosis without current pathological fracture: Secondary | ICD-10-CM | POA: Diagnosis not present

## 2018-07-29 DIAGNOSIS — I272 Pulmonary hypertension, unspecified: Secondary | ICD-10-CM | POA: Diagnosis not present

## 2018-07-29 NOTE — Patient Outreach (Signed)
[facility]) Care Management  07/29/2018  [name] February 14, 1951 [id number]   Telephone Screen  Referral Date: 07/27/2018 Referral Source: MD referral from Wyn Quaker NP  Referral Reason:Chronic Disease Management   Diagnoses of Other COPD/ Pneumonia  Other Diagnosis: protein calorie malnutrition Insurance: united health care medicare     Outreach attempt # 1 spoke with a male who request that CM call his mobile number listed in [place] attempt # 1A to [phone] No answer. [facility] RN CM left HIPAA compliant voicemail message along with CM's contact info.   Plan: [facility] RN CM sent an unsuccessful outreach letter and scheduled this patient for another call attempt within 4 business days   Kimberly L. Lavina Hamman, RN, BSN, [place] Coordinator Office number [phone] Mobile number [phone]  Main [facility] number [phone] Fax number [phone]

## 2018-07-30 ENCOUNTER — Other Ambulatory Visit: Payer: Self-pay | Admitting: *Deleted

## 2018-07-30 ENCOUNTER — Telehealth: Payer: Self-pay | Admitting: Pulmonary Disease

## 2018-07-30 DIAGNOSIS — M810 Age-related osteoporosis without current pathological fracture: Secondary | ICD-10-CM | POA: Diagnosis not present

## 2018-07-30 DIAGNOSIS — Z95820 Peripheral vascular angioplasty status with implants and grafts: Secondary | ICD-10-CM | POA: Diagnosis not present

## 2018-07-30 DIAGNOSIS — I739 Peripheral vascular disease, unspecified: Secondary | ICD-10-CM | POA: Diagnosis not present

## 2018-07-30 DIAGNOSIS — C3412 Malignant neoplasm of upper lobe, left bronchus or lung: Secondary | ICD-10-CM | POA: Diagnosis not present

## 2018-07-30 DIAGNOSIS — I10 Essential (primary) hypertension: Secondary | ICD-10-CM | POA: Diagnosis not present

## 2018-07-30 DIAGNOSIS — I272 Pulmonary hypertension, unspecified: Secondary | ICD-10-CM | POA: Diagnosis not present

## 2018-07-30 DIAGNOSIS — Z7951 Long term (current) use of inhaled steroids: Secondary | ICD-10-CM | POA: Diagnosis not present

## 2018-07-30 DIAGNOSIS — J439 Emphysema, unspecified: Secondary | ICD-10-CM | POA: Diagnosis not present

## 2018-07-30 DIAGNOSIS — Z7902 Long term (current) use of antithrombotics/antiplatelets: Secondary | ICD-10-CM | POA: Diagnosis not present

## 2018-07-30 DIAGNOSIS — D638 Anemia in other chronic diseases classified elsewhere: Secondary | ICD-10-CM | POA: Diagnosis not present

## 2018-07-30 DIAGNOSIS — R918 Other nonspecific abnormal finding of lung field: Secondary | ICD-10-CM | POA: Diagnosis not present

## 2018-07-30 DIAGNOSIS — Z48815 Encounter for surgical aftercare following surgery on the digestive system: Secondary | ICD-10-CM | POA: Diagnosis not present

## 2018-07-30 DIAGNOSIS — Z8719 Personal history of other diseases of the digestive system: Secondary | ICD-10-CM | POA: Diagnosis not present

## 2018-07-30 DIAGNOSIS — Z9889 Other specified postprocedural states: Secondary | ICD-10-CM | POA: Diagnosis not present

## 2018-07-30 DIAGNOSIS — I700 Atherosclerosis of aorta: Secondary | ICD-10-CM | POA: Diagnosis not present

## 2018-07-30 DIAGNOSIS — K861 Other chronic pancreatitis: Secondary | ICD-10-CM | POA: Diagnosis not present

## 2018-07-30 DIAGNOSIS — I743 Embolism and thrombosis of arteries of the lower extremities: Secondary | ICD-10-CM | POA: Diagnosis not present

## 2018-07-30 DIAGNOSIS — E43 Unspecified severe protein-calorie malnutrition: Secondary | ICD-10-CM | POA: Diagnosis not present

## 2018-07-30 NOTE — Telephone Encounter (Signed)
Spoke with [name] at [facility], states that pt is declining [facility] services at this time. Notes that she explained the benefits several times in several ways but pt is refusing at this time.  Forwarding to [employer] as FYI.

## 2018-07-30 NOTE — Patient Outreach (Signed)
[facility]) Care Management  07/30/2018  Samaad Hashem 01/15/1951 [id number]   Referral Date: 07/27/2018 Referral Source: MD referral from Wyn Quaker NP  Referral Reason:Chronic Disease Management   Diagnoses of Other COPD/ Pneumonia  Other Diagnosis: protein calorie malnutrition Insurance: united health care medicare      Patient returned a call to [facility] RN CM from his listed mobile number  Patient is able to verify HIPAA Reviewed and addressed referral to [facility] with patient   Mr  Croghan states he is managing well at home with the assistance of his girlfriend, Charlynn Grimes, and home health staff. He states he is working on his malnutrition concerns by eating one meal in the evening and then continuing to take in food until he goes to bed. He reports he has lately not been able to eat 3 meals at day. He reports intake of carnation supplements bid and he is being encouraged to increase to tid but has not met that goal yet. He does agree that he has loss weight but says this is attributed to being hospitalized for 4 week. BMI 13.9   CM completed the [facility] intake assessment   He did inquire about assistance in finding the contact number for Dr Richmond Campbell (GI) office as he is needing a procedure for his hemorrhoids. He reports having a banding procedure in 2019  His girlfriend called to discuss his needs but was informed that he would have to call the office to give the MD office staff permission to speak with her since she is not on the HIPAA/DPR forms. Cm provided the number and discussed how to give permission for staff to speak with Donita plus how to get forms completed for the future He voiced understanding and appreciation of the assistance and information  He requests CM document that Donita his girlfriend be able to speak to [facility] staff if every needed He states he wanted to provide permission on today  He was noted with a very congested cough during the assessment. He  reports he had taken tylenol on 07/29/18 pm and it got stuck in his throat. He reports drinking warm fluids but still having a tickle in his throat. He reports having a nebulizer but was not in need of home oxygen at discharge on 07/12/2018. Denies desaturation  Social: Mr Thomure report he has very good support from [place] his girlfriend who is a Marine scientist and gets transportation assist to medical appointments from her and another friend prn He reports having 2 different options for rides. He reports services from [facility] from "three different people." He reports that is enough people visiting at this time. He reports needing assist with all his care that is provided by [facility] With review of PHQ -9 and offer of [facility] SW services, He informed CM he sees a counselor already   Conditions: recurrent left inguinal hernia repair, severe COPD, HTN, PVD, NSCLC s/p SBRT to left lung, etoh abuse, former smoker reports stopped January 2020 DME nebulizer, 3 n 1, Rolling walker   Medications: denies concerns with taking medications as prescribed, affording medications, side effects of medications and questions about medications   Appointments:  08/05/2018 oncology hospital f/u appointment He is attempting to schedule an appointment with Dr Richmond Campbell  Cm shared with him that the notes from the MD office reports Dr Earlean Shawl is out of the office until mid March 2020 He voiced understanding  Dr Kim/B Warner Mccreedy, NP Dr Vaughan Browner he was unable to recall  last visitto dr [name]   Advance Directives: He reports having advance directives and not needing to make changes  Consent: [facility] RN CM reviewed [facility] services with patient. Patient gave verbal consent for services. After reviewed of [facility] services x 2, He denies need of services from [facility]/Telephonic RN CM, pharmacy, health coach, NP or SW at this time  He confirmed he would not be interested right now in [place] even if B Warner Mccreedy thought it may be  beneficial for his COPD and protein calorie malnutrition   [facility] RN CM left a message for brian mack, spoke to [place] at the pulmonary office [phone] Caryl Pina from the office also called and CM discussed with her also that Mr [facility] had declined [facility] services She voiced understanding      Plans [facility] RN CM will close case at this time as patient has been assessed and no needs identified/needs resolved.   Pt encouraged to return a call to [place] CM prn She reminded him of an outreach letter sent with [facility] brochure enclosed for review  Routed note to pulmonary NP/MDs   Macon Sandiford L. Lavina Hamman, RN, BSN, [place] Coordinator Office number [phone] Mobile number [phone]  Main [facility] number [phone] Fax number [phone]

## 2018-08-01 NOTE — Telephone Encounter (Signed)
Ok. Can always re offer and educate at future ov.   Wyn Quaker FNP

## 2018-08-02 DIAGNOSIS — Z95820 Peripheral vascular angioplasty status with implants and grafts: Secondary | ICD-10-CM | POA: Diagnosis not present

## 2018-08-02 DIAGNOSIS — E43 Unspecified severe protein-calorie malnutrition: Secondary | ICD-10-CM | POA: Diagnosis not present

## 2018-08-02 DIAGNOSIS — K861 Other chronic pancreatitis: Secondary | ICD-10-CM | POA: Diagnosis not present

## 2018-08-02 DIAGNOSIS — Z7951 Long term (current) use of inhaled steroids: Secondary | ICD-10-CM | POA: Diagnosis not present

## 2018-08-02 DIAGNOSIS — I272 Pulmonary hypertension, unspecified: Secondary | ICD-10-CM | POA: Diagnosis not present

## 2018-08-02 DIAGNOSIS — Z48815 Encounter for surgical aftercare following surgery on the digestive system: Secondary | ICD-10-CM | POA: Diagnosis not present

## 2018-08-02 DIAGNOSIS — I10 Essential (primary) hypertension: Secondary | ICD-10-CM | POA: Diagnosis not present

## 2018-08-02 DIAGNOSIS — Z8719 Personal history of other diseases of the digestive system: Secondary | ICD-10-CM | POA: Diagnosis not present

## 2018-08-02 DIAGNOSIS — Z7902 Long term (current) use of antithrombotics/antiplatelets: Secondary | ICD-10-CM | POA: Diagnosis not present

## 2018-08-02 DIAGNOSIS — C3412 Malignant neoplasm of upper lobe, left bronchus or lung: Secondary | ICD-10-CM | POA: Diagnosis not present

## 2018-08-02 DIAGNOSIS — I743 Embolism and thrombosis of arteries of the lower extremities: Secondary | ICD-10-CM | POA: Diagnosis not present

## 2018-08-02 DIAGNOSIS — M810 Age-related osteoporosis without current pathological fracture: Secondary | ICD-10-CM | POA: Diagnosis not present

## 2018-08-02 DIAGNOSIS — I700 Atherosclerosis of aorta: Secondary | ICD-10-CM | POA: Diagnosis not present

## 2018-08-02 DIAGNOSIS — I739 Peripheral vascular disease, unspecified: Secondary | ICD-10-CM | POA: Diagnosis not present

## 2018-08-02 DIAGNOSIS — R918 Other nonspecific abnormal finding of lung field: Secondary | ICD-10-CM | POA: Diagnosis not present

## 2018-08-02 DIAGNOSIS — D638 Anemia in other chronic diseases classified elsewhere: Secondary | ICD-10-CM | POA: Diagnosis not present

## 2018-08-02 DIAGNOSIS — Z9889 Other specified postprocedural states: Secondary | ICD-10-CM | POA: Diagnosis not present

## 2018-08-02 DIAGNOSIS — J439 Emphysema, unspecified: Secondary | ICD-10-CM | POA: Diagnosis not present

## 2018-08-04 ENCOUNTER — Other Ambulatory Visit: Payer: Self-pay | Admitting: Urology

## 2018-08-04 DIAGNOSIS — C3411 Malignant neoplasm of upper lobe, right bronchus or lung: Secondary | ICD-10-CM

## 2018-08-04 DIAGNOSIS — C3412 Malignant neoplasm of upper lobe, left bronchus or lung: Secondary | ICD-10-CM

## 2018-08-05 ENCOUNTER — Ambulatory Visit: Payer: Medicare Other | Admitting: Urology

## 2018-08-05 ENCOUNTER — Telehealth: Payer: Self-pay | Admitting: *Deleted

## 2018-08-05 DIAGNOSIS — Z8719 Personal history of other diseases of the digestive system: Secondary | ICD-10-CM | POA: Diagnosis not present

## 2018-08-05 DIAGNOSIS — I10 Essential (primary) hypertension: Secondary | ICD-10-CM | POA: Diagnosis not present

## 2018-08-05 DIAGNOSIS — D638 Anemia in other chronic diseases classified elsewhere: Secondary | ICD-10-CM | POA: Diagnosis not present

## 2018-08-05 DIAGNOSIS — E43 Unspecified severe protein-calorie malnutrition: Secondary | ICD-10-CM | POA: Diagnosis not present

## 2018-08-05 DIAGNOSIS — Z95820 Peripheral vascular angioplasty status with implants and grafts: Secondary | ICD-10-CM | POA: Diagnosis not present

## 2018-08-05 DIAGNOSIS — I743 Embolism and thrombosis of arteries of the lower extremities: Secondary | ICD-10-CM | POA: Diagnosis not present

## 2018-08-05 DIAGNOSIS — I739 Peripheral vascular disease, unspecified: Secondary | ICD-10-CM | POA: Diagnosis not present

## 2018-08-05 DIAGNOSIS — I700 Atherosclerosis of aorta: Secondary | ICD-10-CM | POA: Diagnosis not present

## 2018-08-05 DIAGNOSIS — C3412 Malignant neoplasm of upper lobe, left bronchus or lung: Secondary | ICD-10-CM | POA: Diagnosis not present

## 2018-08-05 DIAGNOSIS — R918 Other nonspecific abnormal finding of lung field: Secondary | ICD-10-CM | POA: Diagnosis not present

## 2018-08-05 DIAGNOSIS — Z9889 Other specified postprocedural states: Secondary | ICD-10-CM | POA: Diagnosis not present

## 2018-08-05 DIAGNOSIS — J439 Emphysema, unspecified: Secondary | ICD-10-CM | POA: Diagnosis not present

## 2018-08-05 DIAGNOSIS — Z48815 Encounter for surgical aftercare following surgery on the digestive system: Secondary | ICD-10-CM | POA: Diagnosis not present

## 2018-08-05 DIAGNOSIS — Z7951 Long term (current) use of inhaled steroids: Secondary | ICD-10-CM | POA: Diagnosis not present

## 2018-08-05 DIAGNOSIS — Z7902 Long term (current) use of antithrombotics/antiplatelets: Secondary | ICD-10-CM | POA: Diagnosis not present

## 2018-08-05 DIAGNOSIS — M810 Age-related osteoporosis without current pathological fracture: Secondary | ICD-10-CM | POA: Diagnosis not present

## 2018-08-05 DIAGNOSIS — K861 Other chronic pancreatitis: Secondary | ICD-10-CM | POA: Diagnosis not present

## 2018-08-05 DIAGNOSIS — I272 Pulmonary hypertension, unspecified: Secondary | ICD-10-CM | POA: Diagnosis not present

## 2018-08-05 NOTE — Telephone Encounter (Signed)
CALLED PATIENT TO INFORM OF CT FOR 08-09-18- ARRIVAL TIME - 7:15 AM @ [facility], PT. TO HAVE WATER ONLY - 4 HRS. PRIOR TO TEST, SPOKE WITH SGO, [name] AND SHE IS AWARE OF THIS TEST

## 2018-08-06 DIAGNOSIS — I10 Essential (primary) hypertension: Secondary | ICD-10-CM | POA: Diagnosis not present

## 2018-08-06 DIAGNOSIS — Z7902 Long term (current) use of antithrombotics/antiplatelets: Secondary | ICD-10-CM | POA: Diagnosis not present

## 2018-08-06 DIAGNOSIS — Z48815 Encounter for surgical aftercare following surgery on the digestive system: Secondary | ICD-10-CM | POA: Diagnosis not present

## 2018-08-06 DIAGNOSIS — I700 Atherosclerosis of aorta: Secondary | ICD-10-CM | POA: Diagnosis not present

## 2018-08-06 DIAGNOSIS — J439 Emphysema, unspecified: Secondary | ICD-10-CM | POA: Diagnosis not present

## 2018-08-06 DIAGNOSIS — R918 Other nonspecific abnormal finding of lung field: Secondary | ICD-10-CM | POA: Diagnosis not present

## 2018-08-06 DIAGNOSIS — Z95820 Peripheral vascular angioplasty status with implants and grafts: Secondary | ICD-10-CM | POA: Diagnosis not present

## 2018-08-06 DIAGNOSIS — Z9889 Other specified postprocedural states: Secondary | ICD-10-CM | POA: Diagnosis not present

## 2018-08-06 DIAGNOSIS — I743 Embolism and thrombosis of arteries of the lower extremities: Secondary | ICD-10-CM | POA: Diagnosis not present

## 2018-08-06 DIAGNOSIS — Z8719 Personal history of other diseases of the digestive system: Secondary | ICD-10-CM | POA: Diagnosis not present

## 2018-08-06 DIAGNOSIS — I272 Pulmonary hypertension, unspecified: Secondary | ICD-10-CM | POA: Diagnosis not present

## 2018-08-06 DIAGNOSIS — E43 Unspecified severe protein-calorie malnutrition: Secondary | ICD-10-CM | POA: Diagnosis not present

## 2018-08-06 DIAGNOSIS — Z7951 Long term (current) use of inhaled steroids: Secondary | ICD-10-CM | POA: Diagnosis not present

## 2018-08-06 DIAGNOSIS — K861 Other chronic pancreatitis: Secondary | ICD-10-CM | POA: Diagnosis not present

## 2018-08-06 DIAGNOSIS — I739 Peripheral vascular disease, unspecified: Secondary | ICD-10-CM | POA: Diagnosis not present

## 2018-08-06 DIAGNOSIS — D638 Anemia in other chronic diseases classified elsewhere: Secondary | ICD-10-CM | POA: Diagnosis not present

## 2018-08-06 DIAGNOSIS — M810 Age-related osteoporosis without current pathological fracture: Secondary | ICD-10-CM | POA: Diagnosis not present

## 2018-08-06 DIAGNOSIS — C3412 Malignant neoplasm of upper lobe, left bronchus or lung: Secondary | ICD-10-CM | POA: Diagnosis not present

## 2018-08-09 ENCOUNTER — Telehealth: Payer: Self-pay | Admitting: *Deleted

## 2018-08-09 ENCOUNTER — Encounter (HOSPITAL_COMMUNITY): Payer: Self-pay

## 2018-08-09 ENCOUNTER — Ambulatory Visit (HOSPITAL_COMMUNITY)
Admission: RE | Admit: 2018-08-09 | Discharge: 2018-08-09 | Disposition: A | Discharge status: Home / Self Care | Payer: Medicare Other | Source: Ambulatory Visit | Attending: Urology | Admitting: Urology

## 2018-08-09 DIAGNOSIS — C3412 Malignant neoplasm of upper lobe, left bronchus or lung: Secondary | ICD-10-CM | POA: Diagnosis not present

## 2018-08-09 DIAGNOSIS — I10 Essential (primary) hypertension: Secondary | ICD-10-CM | POA: Diagnosis not present

## 2018-08-09 DIAGNOSIS — Z7902 Long term (current) use of antithrombotics/antiplatelets: Secondary | ICD-10-CM | POA: Diagnosis not present

## 2018-08-09 DIAGNOSIS — C3411 Malignant neoplasm of upper lobe, right bronchus or lung: Secondary | ICD-10-CM | POA: Insufficient documentation

## 2018-08-09 DIAGNOSIS — D638 Anemia in other chronic diseases classified elsewhere: Secondary | ICD-10-CM | POA: Diagnosis not present

## 2018-08-09 DIAGNOSIS — E43 Unspecified severe protein-calorie malnutrition: Secondary | ICD-10-CM | POA: Diagnosis not present

## 2018-08-09 DIAGNOSIS — R918 Other nonspecific abnormal finding of lung field: Secondary | ICD-10-CM | POA: Diagnosis not present

## 2018-08-09 DIAGNOSIS — M810 Age-related osteoporosis without current pathological fracture: Secondary | ICD-10-CM | POA: Diagnosis not present

## 2018-08-09 DIAGNOSIS — I743 Embolism and thrombosis of arteries of the lower extremities: Secondary | ICD-10-CM | POA: Diagnosis not present

## 2018-08-09 DIAGNOSIS — I700 Atherosclerosis of aorta: Secondary | ICD-10-CM | POA: Diagnosis not present

## 2018-08-09 DIAGNOSIS — C3490 Malignant neoplasm of unspecified part of unspecified bronchus or lung: Secondary | ICD-10-CM | POA: Diagnosis not present

## 2018-08-09 DIAGNOSIS — K861 Other chronic pancreatitis: Secondary | ICD-10-CM | POA: Diagnosis not present

## 2018-08-09 DIAGNOSIS — I739 Peripheral vascular disease, unspecified: Secondary | ICD-10-CM | POA: Diagnosis not present

## 2018-08-09 DIAGNOSIS — Z95820 Peripheral vascular angioplasty status with implants and grafts: Secondary | ICD-10-CM | POA: Diagnosis not present

## 2018-08-09 DIAGNOSIS — Z8719 Personal history of other diseases of the digestive system: Secondary | ICD-10-CM | POA: Diagnosis not present

## 2018-08-09 DIAGNOSIS — I272 Pulmonary hypertension, unspecified: Secondary | ICD-10-CM | POA: Diagnosis not present

## 2018-08-09 DIAGNOSIS — Z48815 Encounter for surgical aftercare following surgery on the digestive system: Secondary | ICD-10-CM | POA: Diagnosis not present

## 2018-08-09 DIAGNOSIS — Z7951 Long term (current) use of inhaled steroids: Secondary | ICD-10-CM | POA: Diagnosis not present

## 2018-08-09 DIAGNOSIS — Z9889 Other specified postprocedural states: Secondary | ICD-10-CM | POA: Diagnosis not present

## 2018-08-09 DIAGNOSIS — J439 Emphysema, unspecified: Secondary | ICD-10-CM | POA: Diagnosis not present

## 2018-08-09 MED ORDER — SODIUM CHLORIDE (PF) 0.9 % IJ SOLN
INTRAMUSCULAR | Status: AC
  Filled 2018-08-09: qty 50

## 2018-08-09 MED ORDER — IOHEXOL 300 MG/ML  SOLN
75.0000 mL | Freq: Once | INTRAMUSCULAR | Status: AC | PRN
  Administered 2018-08-09: 75 mL via INTRAVENOUS

## 2018-08-09 NOTE — Telephone Encounter (Signed)
CALLED PATIENT TO INFORM OF FU APPT. FOR 08-12-18, SPOKE WITH PATIENT AND HE IS AWARE OF THIS APPT.

## 2018-08-10 ENCOUNTER — Other Ambulatory Visit (HOSPITAL_COMMUNITY): Payer: Medicare Other

## 2018-08-10 DIAGNOSIS — K4030 Unilateral inguinal hernia, with obstruction, without gangrene, not specified as recurrent: Secondary | ICD-10-CM | POA: Diagnosis not present

## 2018-08-10 DIAGNOSIS — J9601 Acute respiratory failure with hypoxia: Secondary | ICD-10-CM | POA: Diagnosis not present

## 2018-08-10 DIAGNOSIS — G9341 Metabolic encephalopathy: Secondary | ICD-10-CM | POA: Diagnosis not present

## 2018-08-10 DIAGNOSIS — J449 Chronic obstructive pulmonary disease, unspecified: Secondary | ICD-10-CM | POA: Diagnosis not present

## 2018-08-11 DIAGNOSIS — K861 Other chronic pancreatitis: Secondary | ICD-10-CM | POA: Diagnosis not present

## 2018-08-11 DIAGNOSIS — I739 Peripheral vascular disease, unspecified: Secondary | ICD-10-CM | POA: Diagnosis not present

## 2018-08-11 DIAGNOSIS — Z7951 Long term (current) use of inhaled steroids: Secondary | ICD-10-CM | POA: Diagnosis not present

## 2018-08-11 DIAGNOSIS — Z7902 Long term (current) use of antithrombotics/antiplatelets: Secondary | ICD-10-CM | POA: Diagnosis not present

## 2018-08-11 DIAGNOSIS — E43 Unspecified severe protein-calorie malnutrition: Secondary | ICD-10-CM | POA: Diagnosis not present

## 2018-08-11 DIAGNOSIS — I743 Embolism and thrombosis of arteries of the lower extremities: Secondary | ICD-10-CM | POA: Diagnosis not present

## 2018-08-11 DIAGNOSIS — Z48815 Encounter for surgical aftercare following surgery on the digestive system: Secondary | ICD-10-CM | POA: Diagnosis not present

## 2018-08-11 DIAGNOSIS — I700 Atherosclerosis of aorta: Secondary | ICD-10-CM | POA: Diagnosis not present

## 2018-08-11 DIAGNOSIS — Z95820 Peripheral vascular angioplasty status with implants and grafts: Secondary | ICD-10-CM | POA: Diagnosis not present

## 2018-08-11 DIAGNOSIS — I272 Pulmonary hypertension, unspecified: Secondary | ICD-10-CM | POA: Diagnosis not present

## 2018-08-11 DIAGNOSIS — I10 Essential (primary) hypertension: Secondary | ICD-10-CM | POA: Diagnosis not present

## 2018-08-11 DIAGNOSIS — R918 Other nonspecific abnormal finding of lung field: Secondary | ICD-10-CM | POA: Diagnosis not present

## 2018-08-11 DIAGNOSIS — Z9889 Other specified postprocedural states: Secondary | ICD-10-CM | POA: Diagnosis not present

## 2018-08-11 DIAGNOSIS — C3412 Malignant neoplasm of upper lobe, left bronchus or lung: Secondary | ICD-10-CM | POA: Diagnosis not present

## 2018-08-11 DIAGNOSIS — M810 Age-related osteoporosis without current pathological fracture: Secondary | ICD-10-CM | POA: Diagnosis not present

## 2018-08-11 DIAGNOSIS — Z8719 Personal history of other diseases of the digestive system: Secondary | ICD-10-CM | POA: Diagnosis not present

## 2018-08-11 DIAGNOSIS — D638 Anemia in other chronic diseases classified elsewhere: Secondary | ICD-10-CM | POA: Diagnosis not present

## 2018-08-11 DIAGNOSIS — J439 Emphysema, unspecified: Secondary | ICD-10-CM | POA: Diagnosis not present

## 2018-08-12 ENCOUNTER — Telehealth: Payer: Self-pay | Admitting: Urology

## 2018-08-12 ENCOUNTER — Ambulatory Visit: Payer: Self-pay | Admitting: Urology

## 2018-08-12 DIAGNOSIS — C3411 Malignant neoplasm of upper lobe, right bronchus or lung: Secondary | ICD-10-CM

## 2018-08-12 NOTE — Telephone Encounter (Signed)
I called and spoke with the patient's significant other, [name], regarding results from his recent follow-up chest CT since they called and cancelled his scheduled follow up.  The patient missed his previous scheduled six-month follow-up but was recently hospitalized for an incarcerated inguinal hernia and at the time of discharge was advised to follow-up for repeat chest imaging for monitoring of his lung cancer.  His repeat CT chest was performed on 08/09/2018 and shows overall disease stability without any overtly pathologic appearing adenopathy and no new lung lesions.  He reports that he is feeling well in general and has had no recent changes in his breathing.  He specifically denies productive cough, hemoptysis, fever, chills or chest pain.  He has not noticed any recent increase in his shortness of breath with exertion or significant decrease in his energy level.  He denies any recent unintentional weight loss.  He reports a healthy appetite and is maintaining his weight.  We discussed the plan for a repeat CT chest in 6 months and a follow-up thereafter to review the results and recommendations.  They are in agreement with this plan.  They know to call at anytime in the interim with any questions or concerns.  Nicholos Johns, MMS, PA-C [place] [facility] [place]: [phone]  Fax: [phone]

## 2018-08-16 ENCOUNTER — Ambulatory Visit (HOSPITAL_COMMUNITY): Payer: Medicare Other

## 2018-08-18 ENCOUNTER — Encounter: Payer: Medicare Other | Admitting: Vascular Surgery

## 2018-08-24 ENCOUNTER — Other Ambulatory Visit: Payer: Self-pay

## 2018-08-24 DIAGNOSIS — I739 Peripheral vascular disease, unspecified: Secondary | ICD-10-CM

## 2018-08-24 MED ORDER — CLOPIDOGREL BISULFATE 75 MG PO TABS: 75.0000 mg | ORAL_TABLET | Freq: Every day | ORAL | 2 refills | Status: AC

## 2018-09-07 ENCOUNTER — Telehealth (HOSPITAL_COMMUNITY): Payer: Self-pay | Admitting: Rehabilitation

## 2018-09-07 NOTE — Telephone Encounter (Signed)
The above patient or their representative was contacted and gave the following answers to these questions:         Do you have any of the following symptoms? No  Fever                    Cough                   Shortness of breath  Do  you have any of the following other symptoms? No   muscle pain         vomiting,        diarrhea        rash         weakness        red eye        abdominal pain         bruising          bruising or bleeding              joint pain           severe headache    Have you been in contact with someone who was or has been sick in the past 2 weeks? No  Yes                 Unsure                         Unable to assess   Does the person that you were in contact with have any of the following symptoms? No  Cough         shortness of breath           muscle pain         vomiting,            diarrhea            rash            weakness           fever            red eye           abdominal pain           bruising  or  bleeding                joint pain                severe headache               Have you  or someone you have been in contact with traveled internationally in th last month?  No       If yes, which countries?   Have you  or someone you have been in contact with traveled outside [place] in th last month?  No       If yes, which state and city?   COMMENTS OR ACTION PLAN FOR THIS PATIENT:

## 2018-09-08 ENCOUNTER — Encounter: Payer: Self-pay | Admitting: Vascular Surgery

## 2018-09-08 ENCOUNTER — Ambulatory Visit (INDEPENDENT_AMBULATORY_CARE_PROVIDER_SITE_OTHER): Payer: Medicare Other | Admitting: Vascular Surgery

## 2018-09-08 ENCOUNTER — Other Ambulatory Visit: Payer: Self-pay

## 2018-09-08 VITALS — BP 113/72 | HR 76 | Temp 97.4°F | Resp 18 | Ht 62.0 in | Wt 81.3 lb

## 2018-09-08 DIAGNOSIS — L97909 Non-pressure chronic ulcer of unspecified part of unspecified lower leg with unspecified severity: Secondary | ICD-10-CM | POA: Diagnosis not present

## 2018-09-08 DIAGNOSIS — Z87891 Personal history of nicotine dependence: Secondary | ICD-10-CM

## 2018-09-08 DIAGNOSIS — I70299 Other atherosclerosis of native arteries of extremities, unspecified extremity: Secondary | ICD-10-CM | POA: Diagnosis not present

## 2018-09-08 NOTE — Progress Notes (Signed)
Patient name: [name] MRN: [id number] DOB: [birth year] Sex: male  REASON FOR VISIT:   Follow-up of peripheral vascular disease.  HPI:   [name] is a pleasant 68 y.o. male who I saw in consultation on 06/21/2018 at [facility] with left leg pain.  The patient had been admitted there with a left inguinal hernia and developed a bowel obstruction.  He required repair of an incarcerated inguinal hernia on 06/18/2018.  From a vascular standpoint, he had undergone angioplasty and stenting of the right external iliac artery byDr. Virgina Jock on 03/30/2018.  The arteriogram at that time reportedly showed no named vessels on the left except for a hint of the deep femoral artery.  He was scheduled to be seen by [place] in the office but missed that appointment.  When I was saw him in the office he was having rest pain bilaterally but more significantly on the left side.  He also had a small wound on the medial aspect of his left first metatarsal head.  His risk factors for peripheral vascular disease include heavy tobacco use and hypertension.  He denies any history of diabetes, family history of premature cardiovascular disease, or hypercholesterolemia.  Since I saw him last, he continues to have rest pain in the left foot.  This pain has been fairly stable.  He has cut back on smoking to half a pack per day.  He also experiences claudication in the left calf which is brought on by ambulation relieved with rest.  This is mostly in the calf.  He has some right calf claudication.  He weighs 80 pounds and is trying to eat as best he can.  Past Medical History:  Diagnosis Date   COPD (chronic obstructive pulmonary disease) ([place])    Hypertension    Iliac artery stenosis, right ([facility])    80% Stenosis   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[place])    Lower limb ischemia 03/26/2018   Lung cancer dx'd 12/2014   Pancreatitis    x2 stents placed to make patent duct to pancreas   Pulmonary  hypertension ([facility])    Severe claudication ([place])    Status post left foot surgery    Tobacco abuse     Family History  Problem Relation Age of Onset   COPD Mother    CAD Mother    Diabetes Father    Cancer Brother        metastatic, unknown primary   Cancer Paternal Grandmother     SOCIAL HISTORY: Social History   Tobacco Use   Smoking status: Former Smoker    Packs/day: 1.00    Years: 50.00    Pack years: 50.00    Types: Cigarettes   Smokeless tobacco: Never Used   Tobacco comment: Stopped when patient was hospitalized  Substance Use Topics   Alcohol use: Not Currently    Comment: Patient reports he has not had a drink since when he was hospitalized in January/2020    Allergies  Allergen Reactions   Chantix [Varenicline] Other (See Comments)    Physically ill   Prevacid [Lansoprazole] Itching    "swell up with gas"    Current Outpatient Medications  Medication Sig Dispense Refill   acetaminophen (TYLENOL) 325 MG tablet Take 2 tablets (650 mg total) by mouth every 6 (six) hours as needed.     albuterol (PROVENTIL) (2.5 MG/3ML) 0.083% nebulizer solution Take 3 mLs (2.5 mg total) by nebulization every 2 (two) hours as needed for wheezing or shortness of  breath. 75 mL 1   amLODipine (NORVASC) 10 MG tablet Take 10 mg by mouth every morning.      budesonide (PULMICORT) 0.5 MG/2ML nebulizer solution Take 2 mLs (0.5 mg total) by nebulization 2 (two) times daily. 56 mL 1   buPROPion (WELLBUTRIN XL) 150 MG 24 hr tablet Take 150 mg by mouth every morning.      clopidogrel (PLAVIX) 75 MG tablet Take 1 tablet (75 mg total) by mouth daily. 30 tablet 2   dextromethorphan-guaiFENesin (MUCINEX DM) 30-600 MG 12hr tablet Take 1-2 tablets by mouth 2 (two) times daily as needed for cough.     fluticasone (FLONASE) 50 MCG/ACT nasal spray      hydrocortisone-pramoxine (ANALPRAM-HC) 2.5-1 % rectal cream Place rectally 4 (four) times daily. 30 g 0   ipratropium  (ATROVENT) 0.02 % nebulizer solution      ipratropium-albuterol (DUONEB) 0.5-2.5 (3) MG/3ML SOLN Take 3 mLs by nebulization 3 (three) times daily. 360 mL 0   metoprolol succinate (TOPROL-XL) 50 MG 24 hr tablet Take 1 tablet (50 mg total) by mouth every morning. 30 tablet 3   Multiple Vitamin (MULTIVITAMIN WITH MINERALS) TABS tablet Take 1 tablet by mouth every morning.     Nutritional Supplements (CARNATION BREAKFAST ESSENTIALS PO) Take 1 Container by mouth 2 (two) times daily.      saccharomyces boulardii (FLORASTOR) 250 MG capsule Take 1 capsule (250 mg total) by mouth 2 (two) times daily. You can get a probiotic over the counter.     No current facility-administered medications for this visit.     REVIEW OF SYSTEMS:  [X]  denotes positive finding, [ ]  denotes negative finding Cardiac  Comments:  Chest pain or chest pressure:    Shortness of breath upon exertion:    Short of breath when lying flat:    Irregular heart rhythm:        Vascular    Pain in calf, thigh, or hip brought on by ambulation: x   Pain in feet at night that wakes you up from your sleep:  x   Blood clot in your veins:    Leg swelling:         Pulmonary    Oxygen at home:    Productive cough:     Wheezing:         Neurologic    Sudden weakness in arms or legs:     Sudden numbness in arms or legs:     Sudden onset of difficulty speaking or slurred speech:    Temporary loss of vision in one eye:     Problems with dizziness:         Gastrointestinal    Blood in stool:     Vomited blood:         Genitourinary    Burning when urinating:     Blood in urine:        Psychiatric    Major depression:         Hematologic    Bleeding problems:    Problems with blood clotting too easily:        Skin    Rashes or ulcers:        Constitutional    Fever or chills:     PHYSICAL EXAM:   Vitals:   09/08/18 0856  BP: 113/72  Pulse: 76  Resp: 18  Temp: (!) 97.4 F (36.3 C)  SpO2: 93%  Weight: 81  lb 4.8 oz (36.9 kg)  Height: 5\' 2"  (  1.575 m)    GENERAL: The patient is a well-nourished male, in no acute distress. The vital signs are documented above. CARDIAC: There is a regular rate and rhythm.  VASCULAR: I do not detect carotid bruits. On the right side he has a palpable femoral pulse.  I cannot palpate a popliteal or pedal pulses.  He has a monophasic dorsalis pedis and posterior tibial signal with the Doppler. On the left side, I cannot palpate a femoral, popliteal, or pedal pulse.  He has a dampened monophasic posterior tibial signal with Doppler.  I cannot obtain a dorsalis pedis signal. PULMONARY: There is good air exchange bilaterally without wheezing or rales. ABDOMEN: Soft and non-tender with normal pitched bowel sounds.  MUSCULOSKELETAL: There are no major deformities or cyanosis. NEUROLOGIC: No focal weakness or paresthesias are detected. SKIN: He has a very small ulcer on his left heel.  He has a very small wound on the medial aspect of his left great toe is only about 2 mm in diameter. PSYCHIATRIC: The patient has a normal affect.  DATA:    ARTERIOGRAM: I reviewed his arteriogram that was done on 03/30/2018.  He had undergone angioplasty and stenting of the right external iliac artery and the stent extended fairly low down towards the inguinal crease.  He had a superficial femoral artery occlusion below that.  There was very limited visualization on the left and the only thing I can really see was a hint of the deep femoral artery on the left.  He had diffuse calcific disease.  CT ABDOMEN PELVIS: I reviewed his CT scan of the abdomen and pelvis that was done on 06/13/2018.  He had extensive diffuse atherosclerosis noted.  CT ANGIOGRAM: His CT angiogram shows diffuse calcific disease in small arteries.  His only option for revascularization based on this would be a right to left femorofemoral bypass and a left femoral to below-knee pop bypass.  The stent in the right external  iliac arteries extends fairly low which would make clamping proximally difficult on the right.  MEDICAL ISSUES:   CRITICAL LIMB ISCHEMIA LEFT LEG: This patient has rest pain of the left leg.  Based on my review of his studies his only option for revascularization would be a right to left femorofemoral bypass graft with a left femoral to below-knee popliteal artery bypass.  I explained that there is significant risk associated with this for multiple reasons.  First, he has had a superficial femoral artery occlusion on the right and a right to left femorofemoral bypass could potentially compromise his circulation on the right.  #2, his arteries are very small and calcified and his vein on exam looks quite small.  Doing a prosthetic bypass into the small calcified arteries may not have a good long-term outcome with a high risk of graft thrombosis.  #3 if the grafts do occlude the situation could become worse and that it will require interruption of significant collaterals in order to proceed with bypass.  #4 we are in the middle of this coronavirus pandemic which certainly increases his risk of hospitalization.  #5, he has significant protein calorie malnutrition.  For all of these reasons I would favor trying aggressive conservative treatment.  We have had a long discussion about the importance of tobacco cessation (> 3 min) and how nicotine can cause vasospasm for up to 12 hours.  I discussed with him the importance of nutrition.  I tried to encourage him to stay as active as possible to develop collateral  flow.  I will plan on seeing him back in 6 weeks.  Certainly if his symptoms progress he can call and we will see him sooner.  Deitra Mayo [facility] [phone]

## 2018-09-10 DIAGNOSIS — J449 Chronic obstructive pulmonary disease, unspecified: Secondary | ICD-10-CM | POA: Diagnosis not present

## 2018-09-10 DIAGNOSIS — J9601 Acute respiratory failure with hypoxia: Secondary | ICD-10-CM | POA: Diagnosis not present

## 2018-09-10 DIAGNOSIS — K4030 Unilateral inguinal hernia, with obstruction, without gangrene, not specified as recurrent: Secondary | ICD-10-CM | POA: Diagnosis not present

## 2018-09-10 DIAGNOSIS — G9341 Metabolic encephalopathy: Secondary | ICD-10-CM | POA: Diagnosis not present

## 2018-09-20 ENCOUNTER — Ambulatory Visit: Payer: Medicare Other | Admitting: Pulmonary Disease

## 2018-10-04 DIAGNOSIS — R739 Hyperglycemia, unspecified: Secondary | ICD-10-CM | POA: Diagnosis not present

## 2018-10-04 DIAGNOSIS — Z0000 Encounter for general adult medical examination without abnormal findings: Secondary | ICD-10-CM | POA: Diagnosis not present

## 2018-10-04 DIAGNOSIS — I10 Essential (primary) hypertension: Secondary | ICD-10-CM | POA: Diagnosis not present

## 2018-10-05 ENCOUNTER — Telehealth: Payer: Self-pay | Admitting: Neurology

## 2018-10-05 NOTE — Telephone Encounter (Signed)
I called and spoke with the patient regarding changing his visit to a TELEPHONE VISIT due to COVID-19. He agreed and gave consent for insurance purposes. I also informed him the nurse would be reaching out before the apt.

## 2018-10-07 ENCOUNTER — Encounter: Payer: Self-pay | Admitting: Neurology

## 2018-10-07 NOTE — Telephone Encounter (Signed)
Spoke with patient. Confirmed pt using 2 identifiers. Per pt, no changes to PMH since 09/08/2018 when seen by Dr. Scot Dock. Pt reports he went back to drinking & smoking but not as much. Drinks a 6 pack of beer weekly and smokes 0.5 ppd of cigarettes. Pt provided updates to his med list. Pt understands he will receive a call to check-in at 09:00 Monday followed by Dr. Cathren Laine call at 09:30 AM.

## 2018-10-10 DIAGNOSIS — K4030 Unilateral inguinal hernia, with obstruction, without gangrene, not specified as recurrent: Secondary | ICD-10-CM | POA: Diagnosis not present

## 2018-10-10 DIAGNOSIS — G9341 Metabolic encephalopathy: Secondary | ICD-10-CM | POA: Diagnosis not present

## 2018-10-10 DIAGNOSIS — J9601 Acute respiratory failure with hypoxia: Secondary | ICD-10-CM | POA: Diagnosis not present

## 2018-10-10 DIAGNOSIS — J449 Chronic obstructive pulmonary disease, unspecified: Secondary | ICD-10-CM | POA: Diagnosis not present

## 2018-10-11 ENCOUNTER — Other Ambulatory Visit: Payer: Self-pay

## 2018-10-11 ENCOUNTER — Ambulatory Visit (INDEPENDENT_AMBULATORY_CARE_PROVIDER_SITE_OTHER): Payer: Medicare Other | Admitting: Neurology

## 2018-10-11 DIAGNOSIS — R413 Other amnesia: Secondary | ICD-10-CM | POA: Diagnosis not present

## 2018-10-11 DIAGNOSIS — C729 Malignant neoplasm of central nervous system, unspecified: Secondary | ICD-10-CM

## 2018-10-11 DIAGNOSIS — F1020 Alcohol dependence, uncomplicated: Secondary | ICD-10-CM

## 2018-10-11 DIAGNOSIS — J449 Chronic obstructive pulmonary disease, unspecified: Secondary | ICD-10-CM | POA: Diagnosis not present

## 2018-10-11 DIAGNOSIS — I10 Essential (primary) hypertension: Secondary | ICD-10-CM | POA: Diagnosis not present

## 2018-10-11 DIAGNOSIS — Z23 Encounter for immunization: Secondary | ICD-10-CM | POA: Diagnosis not present

## 2018-10-11 DIAGNOSIS — Z0000 Encounter for general adult medical examination without abnormal findings: Secondary | ICD-10-CM | POA: Diagnosis not present

## 2018-10-11 DIAGNOSIS — R9089 Other abnormal findings on diagnostic imaging of central nervous system: Secondary | ICD-10-CM | POA: Diagnosis not present

## 2018-10-11 DIAGNOSIS — R739 Hyperglycemia, unspecified: Secondary | ICD-10-CM | POA: Diagnosis not present

## 2018-10-11 NOTE — Progress Notes (Signed)
[facility]    Provider:  Dr Jaynee Eagles Referring Provider: [name], MD Primary Care Physician:  [name], MD  CC:  Brain lesion  Virtual Visit via Video Note  I connected with [name] on 10/11/18 at  9:30 AM EDT by a video enabled telemedicine application and verified that I am speaking with the correct person using two identifiers.  Location: Patient: Home Provider: office   I discussed the limitations of evaluation and management by telemedicine and the availability of in person appointments. The patient expressed understanding and agreed to proceed.  Follow Up Instructions:    I discussed the assessment and treatment plan with the patient. The patient was provided an opportunity to ask questions and all were answered. The patient agreed with the plan and demonstrated an understanding of the instructions.   The patient was advised to call back or seek an in-person evaluation if the symptoms worsen or if the condition fails to improve as anticipated.  I provided 22 minutes of non-face-to-face time during this encounter.   Melvenia Beam, MD    Interval history 10/11/2018: This is a 68 year old male here for follow-up of a corpus callosum lesion, past medical history of alcoholism, pancreatitis, lung cancer, ischemic bowel disease, hypertension, COPD. Marchiafava-Bignami disease a possibility. He continues to drink excessive amounts of alcohol daily.  Discussed the role of alcohol plays in lesions of the brain, dementia, cerebellar atrophy, strokes and recommended following with primary care for an alcohol cessation program. He feels his memory is worsening but not a whole lot he says he is pretty stable and doing well. If he forgets something he can remember in about 15 minutes. He continues to drink and explained that his memory will continue to decline. He says his mood is fine. His balance is worse. Discussed fall precautions and that alcohol can  damage the nerve in his feet as well and cause imbalance. He has tp write things down more. Continue to follow with Dr. [name]. Spoke to his wife, she feels his cognitive decline is more dramatic than patient thinks. He is still drinking alcohol, discussed again that this is likely highly contributing to his cognitive dysfunction and his balance. Discussed fall rpecaustions.  Interval history 05/11/2017: This is a 68 year old male here for follow-up of a corpus callosum lesion, past medical history of alcoholism, pancreatitis, lung cancer, ischemic bowel disease, hypertension, COPD. Marchiafava-Bignami disease a possibility. He continues to drink excessive amounts of alcohol daily.  Discussed the role of alcohol plays in lesions of the brain, dementia, cerebellar atrophy, strokes and recommended following with primary care for an alcohol cessation program.  Did discuss that he should not stop acutely or abruptly as this could cause delirium tremens and death. He still continues to smoke.   He denies  paranoia, psychosis, or dementia. Seizures are common, and hemiparesis, aphasia, abnormal movements, and ataxia, discussed Marchiafava-Bignami can progress to coma and death. Will repeat MRI brain w/wo contrast. He does have depression but not worsening. Wife says that he has been more suspicious of her. If she has been gone too long he gets upset. He says he has a past history of women cheating on him.  HPI:  [name] is a 68 y.o. male here as a referral from Dr. [name] for brain lesion. He is here with his partner who provides much information. In January he had a cough and started showing signs of confusion. MRI was ordered. He would forget conversations, lose things, no alteration  of awareness. His legs just gave out, in the setting of illness (cough, maybe bronchitis). He has an extensive alcohol history. He drinks every day stating at 330pm. He drinks beer, a 6-pack every day. Patient denies any difficulty  with anomia, dyspraxia, visual field problems or visual motor difficulties. MRI showed a lesion in the splenium, repeat MRI stable. No other focal neurologic deficits pe rpatient, associated symptoms, inciting events or modifiable factors. He is back to his baseline and he will continue to drink alcohol, he has been through rehabilitation already and not interested.  Reviewed notes, labs and imaging from outside physicians, which showed:  Personally reviewed MRI images and also reviewed them with patient and his partner:  IMPRESSION: 1. Stable MRI of the brain with no acute intracranial abnormality or abnormal enhancement. 2. 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 Given stable persistence seizure activity or acute infectious/inflammatory process is unlikely. Low grade glial neoplasm is not excluded and continued follow-up to ensure stability is recommended. 3. Mild chronic microvascular ischemic changes and mild parenchymal volume loss of the brain. 4. Stable paranasal sinus disease.  Review of Systems: Patient complains of symptoms per HPI as well as the following symptoms: alcoholism. Pertinent negatives and positives per HPI. All others negative.   Social History   Socioeconomic History  . Marital status: Significant Other    Spouse name: Not on file  . Number of children: 3  . Years of education: 36  . Highest education level: Not on file  Occupational History  . Occupation: Retired  Scientific laboratory technician  . Financial resource strain: Not on file  . Food insecurity:    Worry: Not on file    Inability: Not on file  . Transportation needs:    Medical: Not on file    Non-medical: Not on file  Tobacco Use  . Smoking status: Current Every Day Smoker    Packs/day: 1.00    Years: 50.00     Pack years: 50.00    Types: Cigarettes  . Smokeless tobacco: Never Used  . Tobacco comment: smoking 0.5 ppd as of 10/07/2018  Substance and Sexual Activity  . Alcohol use: Yes    Alcohol/week: 6.0 standard drinks    Types: 6 Cans of beer per week  . Drug use: No  . Sexual activity: Yes    Birth control/protection: Injection  Lifestyle  . Physical activity:    Days per week: Not on file    Minutes per session: Not on file  . Stress: Not on file  Relationships  . Social connections:    Talks on phone: Not on file    Gets together: Not on file    Attends religious service: Not on file    Active member of club or organization: Not on file    Attends meetings of clubs or organizations: Not on file    Relationship status: Not on file  . Intimate partner violence:    Fear of current or ex partner: Not on file    Emotionally abused: Not on file    Physically abused: Not on file    Forced sexual activity: Not on file  Other Topics Concern  . Not on file  Social History Narrative   Lives at home w/ a friend   Left-handed   Caffeine: 2 cups of coffee per day    Family History  Problem Relation Age of Onset  . COPD Mother   . CAD Mother   . Diabetes Father   . Cancer Brother        metastatic, unknown primary  . Cancer Paternal Grandmother     Past Medical History:  Diagnosis Date  . Allergies    being around corn & grain causes itchy eyes, nasal congestion. He can eat these with no problems.  Marland Kitchen COPD (chronic obstructive pulmonary disease) ([place])   . Hypertension   . Iliac artery stenosis, right (HCC)    80% Stenosis  . 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[place])   . Lower limb ischemia 03/26/2018  . Lung cancer dx'd 12/2014  . Pancreatitis    x2 stents placed to make patent duct to pancreas  . Pulmonary hypertension ([place])   . Severe claudication ([place])   . Status post left foot surgery   . Tobacco abuse     Past Surgical History:  Procedure Laterality Date  . ABDOMINAL AORTOGRAM  W/LOWER EXTREMITY N/A 03/30/2018   Procedure: ABDOMINAL AORTOGRAM W/LOWER EXTREMITY;  Surgeon: Nigel Mormon, MD;  Location: [facility];  Service: Cardiovascular;  Laterality: N/A;  . COLECTOMY    . COLOSTOMY    . COLOSTOMY REVERSAL    . EUS N/A 12/14/2014   Procedure: UPPER ENDOSCOPIC ULTRASOUND (EUS) LINEAR;  Surgeon: Milus Banister, MD;  Location: [facility];  Service: Endoscopy;  Laterality: N/A;  . FOOT SURGERY Left 2006  . INGUINAL HERNIA REPAIR Left 06/18/2018   Procedure: OPEN RECURRENT LEFT INGUINAL HERNIA REPAIR WITH MESH;  Surgeon: Armandina Gemma, MD;  Location: [facility];  Service: General;  Laterality: Left;  . INSERTION OF MESH Left 06/18/2018   Procedure: INSERTION OF MESH;  Surgeon: Armandina Gemma, MD;  Location: [facility];  Service: General;  Laterality: Left;  . LAPAROTOMY N/A 06/30/2018   Procedure: EXPLORATORY LAPAROTOMY LYSISIS OF ADHESIONS;  Surgeon: Jovita Kussmaul, MD;  Location: [facility];  Service: General;  Laterality: N/A;  . PANCREAS SURGERY  2007  . PERIPHERAL VASCULAR INTERVENTION  03/30/2018   Procedure: PERIPHERAL VASCULAR INTERVENTION;  Surgeon: Nigel Mormon, MD;  Location: [facility];  Service: Cardiovascular;;  Rt Iliac  . UPPER GI ENDOSCOPY    . VENTRAL HERNIA REPAIR  1967    Current Outpatient Medications  Medication Sig Dispense Refill  . acetaminophen (TYLENOL) 325 MG tablet Take 2 tablets (650 mg total) by mouth every 6 (six) hours as needed. (Patient not taking: Reported on 10/07/2018)    . albuterol (PROVENTIL) (2.5 MG/3ML) 0.083% nebulizer solution Take 3 mLs (2.5 mg total) by nebulization every 2 (two) hours as needed for wheezing or shortness of breath. 75 mL 1  . amLODipine (NORVASC) 10 MG tablet Take 10 mg by mouth every morning.     . budesonide (PULMICORT) 0.5 MG/2ML nebulizer solution Take 2 mLs (0.5 mg total) by nebulization 2 (two) times daily. (Patient not taking: Reported on 10/07/2018) 56 mL 1  . buPROPion (WELLBUTRIN XL)  150 MG 24 hr tablet Take 150 mg by mouth every morning.     . clopidogrel (PLAVIX) 75 MG tablet Take 1 tablet (75 mg total) by mouth daily. (Patient not taking: Reported on 10/07/2018) 30 tablet 2  . dextromethorphan-guaiFENesin (MUCINEX DM) 30-600 MG 12hr tablet Take 1-2 tablets by mouth 2 (two) times daily as needed for cough.    . fluticasone (FLONASE) 50 MCG/ACT nasal spray     . ipratropium (ATROVENT) 0.02 % nebulizer solution     .  ipratropium-albuterol (DUONEB) 0.5-2.5 (3) MG/3ML SOLN Take 3 mLs by nebulization 3 (three) times daily. (Patient not taking: Reported on 10/07/2018) 360 mL 0  . metoprolol succinate (TOPROL-XL) 50 MG 24 hr tablet Take 1 tablet (50 mg total) by mouth every morning. 30 tablet 3  . Multiple Vitamin (MULTIVITAMIN WITH MINERALS) TABS tablet Take 1 tablet by mouth every morning.    . Nutritional Supplements (CARNATION BREAKFAST ESSENTIALS PO) Take 1 Container by mouth 2 (two) times daily.     Marland Kitchen saccharomyces boulardii (FLORASTOR) 250 MG capsule Take 1 capsule (250 mg total) by mouth 2 (two) times daily. You can get a probiotic over the counter. (Patient not taking: Reported on 10/07/2018)     No current facility-administered medications for this visit.     Allergies as of 10/11/2018 - Review Complete 09/08/2018  Allergen Reaction Noted  . Chantix [varenicline] Other (See Comments) 12/26/2014  . Prevacid [lansoprazole] Itching 11/30/2014    Vitals: There were no vitals taken for this visit. Last Weight:  Wt Readings from Last 1 Encounters:  10/07/18 84 lb (38.1 kg)   Last Height:   Ht Readings from Last 1 Encounters:  10/07/18 5\' 3"  (1.6 m)    Physical exam: Exam: Gen: NAD, conversant, thin, Poorly groomed, smells heavily of smoke                     CV: RRR, no MRG. No Carotid Bruits. No peripheral edema, warm, nontender Eyes: Conjunctivae clear without exudates or hemorrhage  Neuro: Detailed Neurologic Exam  Speech:    Speech is normal; fluent and  spontaneous with normal comprehension.  Cognition:    The patient is oriented to person, place, and time;     recent and remote memory impaired;     language fluent;     impaired attention, concentration,     fund of knowledge Cranial Nerves:    The pupils are equal, round, and reactive to light. The fundi are normal and spontaneous venous pulsations are present. Visual fields are full to finger confrontation. Extraocular movements are intact. Trigeminal sensation is intact and the muscles of mastication are normal. The face is symmetric. The palate elevates in the midline. Hearing intact. Voice is normal. Shoulder shrug is normal. The tongue has normal motion without fasciculations.   Coordination:    No dysmetria  Gait:    Heel-toe and tandem gait are intact with mild imbalance  Motor Observation:    Mild tremor more postural , Thin tapering legs, generalized atrophy Tone:    Dec muscle tone.    Posture:    Posture is normal. normal erect    Strength:    Strength is V/V in the upper and lower limbs.      Sensation: intact to LT     Reflex Exam:  DTR's:    Deep tendon reflexes in the upper and lower extremities are brisk bilaterally.   Toes:    Right upgoing Clonus:    Clonus is absent.  Assessment/Plan:  This is a 68 year old male looks much older than stated age, poorly groomed, smells heavily of smoke. MRI of the brain showed a left paramedian splenium lesion with a broad differential cannot exclude low-grade glioma. 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 I did discuss this with patient and his partner and advised that this could worsen if he didn't get help for his alcohol intake, also advise stopping abruptly can cause withdrawal  and death. He is not interested in reducing his alcohol intake.  - he continues to drink alcohol, not interested in cessation.  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 Repeat MRI brain.  - Also may be caused by nutritional deficiencies, needs nutritional support, has gained some weight per wife but continues to have cognitive decline   Provided information from the [employer] about Marchiafava-Bignami, discussed in detail with patient and partner.  Orders Placed This Encounter  Procedures  . MR BRAIN W WO CONTRAST    Cc: [name], MD   Sarina Ill, MD  [facility] [address] [place], [place] [address]  Phone [phone] Fax [phone]

## 2018-10-12 ENCOUNTER — Telehealth: Payer: Self-pay | Admitting: Neurology

## 2018-10-12 NOTE — Telephone Encounter (Signed)
[employer] medicare order sent to [facility]. No auth they will reach out to the pt to schedule.  °

## 2018-10-19 ENCOUNTER — Telehealth (HOSPITAL_COMMUNITY): Payer: Self-pay | Admitting: Rehabilitation

## 2018-10-19 NOTE — Telephone Encounter (Signed)
The above patient or their representative was contacted and gave the following answers to these questions:         Do you have any of the following symptoms? No  Fever                    Cough                   Shortness of breath  Do  you have any of the following other symptoms? No   muscle pain         vomiting,        diarrhea        rash         weakness        red eye        abdominal pain         bruising          bruising or bleeding              joint pain           severe headache    Have you been in contact with someone who was or has been sick in the past 2 weeks? No  Yes                 Unsure                         Unable to assess   Does the person that you were in contact with have any of the following symptoms?   Cough         shortness of breath           muscle pain         vomiting,            diarrhea            rash            weakness           fever            red eye           abdominal pain           bruising  or  bleeding                joint pain                severe headache               Have you  or someone you have been in contact with traveled internationally in th last month? No        If yes, which countries?   Have you  or someone you have been in contact with traveled outside [place] in th last month? No         If yes, which state and city?   COMMENTS OR ACTION PLAN FOR THIS PATIENT:          

## 2018-10-20 ENCOUNTER — Other Ambulatory Visit: Payer: Self-pay

## 2018-10-20 ENCOUNTER — Ambulatory Visit (INDEPENDENT_AMBULATORY_CARE_PROVIDER_SITE_OTHER): Payer: Medicare Other | Admitting: Vascular Surgery

## 2018-10-20 ENCOUNTER — Encounter: Payer: Self-pay | Admitting: Vascular Surgery

## 2018-10-20 VITALS — BP 151/93 | HR 77 | Temp 97.2°F | Resp 20 | Ht 63.0 in | Wt 83.7 lb

## 2018-10-20 DIAGNOSIS — I70299 Other atherosclerosis of native arteries of extremities, unspecified extremity: Secondary | ICD-10-CM

## 2018-10-20 DIAGNOSIS — L97909 Non-pressure chronic ulcer of unspecified part of unspecified lower leg with unspecified severity: Secondary | ICD-10-CM | POA: Diagnosis not present

## 2018-10-20 NOTE — Progress Notes (Signed)
Patient name: [name] MRN: [id number] DOB: January 28, 1951 Sex: male  REASON FOR VISIT:   Follow-up of peripheral vascular disease and wound check  HPI:   [name] is a pleasant 68 y.o. male who I saw in consultation at [facility] on 06/21/2018 with left leg pain.  The patient had been admitted there with a left inguinal hernia and developed a bowel obstruction and required surgery.  He had a complicated hospitalization.  From a vascular standpoint he had undergone angioplasty and stenting of the right external iliac artery by Dr. Virgina Jock on 03/30/2018.  There was really no good images on the left except for a hint of a deep femoral artery.  He had a small wound on the medial aspect of his left first metatarsal head.  When I saw him last he was having some rest pain in the left foot.  This had been stable.  He had cut back on his smoking to half a pack per day.  He weighed 80 pounds and clearly has severe protein calorie malnutrition.  On exam, on the left side, I could not palpate a femoral, popliteal, or pedal pulses.  He had a dampened monophasic posterior tibial signal with the Doppler only.  He also had a very small ulcer in his left heel and a small wound on the medial aspect of his left great toe which was 2 mm in diameter.  I reviewed his arteriogram which showed that the stent in the right external iliac artery extended fairly low towards the inguinal crease.  He had a superficial femoral artery occlusion below that on the right.  He did have a CT angiogram which shows diffuse calcific disease and small arteries.  His only option for revascularization for the left lower extremity would be a right to left femorofemoral bypass and a left femoral to below-knee popliteal bypass.  The stent in the right external iliac artery extends low which would make right femoral anastomosis tricky.  I felt that surgery would be risky for multiple reasons.  First he had a superficial femoral  artery occlusion on the right and a right to left femorofemoral bypass could potentially compromise circulation on the right.  #2 his arteries were small and calcified and his vein on my exam look quite small.  Doing a prosthetic bypass into a small calcified artery would likely not have a good long-term outcome.  If the graft occluded the situation could actually become worse.  Finally he clearly has severe protein calorie malnutrition.  For this reason I favor a conservative approach.  We had a long discussion about the importance of getting off tobacco completely.  He comes in for 6 weeks follow-up visit.  Since I saw him last he continues to have some rest pain on the left.  His symptoms are stable.  He is walking more he has some left calf claudication.  He has no significant symptoms on the right.  He denies fever or chills.  He has had no drainage from his small wounds.  Current Outpatient Medications  Medication Sig Dispense Refill   albuterol (PROVENTIL) (2.5 MG/3ML) 0.083% nebulizer solution Take 3 mLs (2.5 mg total) by nebulization every 2 (two) hours as needed for wheezing or shortness of breath. 75 mL 1   amLODipine (NORVASC) 10 MG tablet Take 10 mg by mouth every morning.      buPROPion (WELLBUTRIN XL) 150 MG 24 hr tablet Take 150 mg by mouth every morning.  dextromethorphan-guaiFENesin (MUCINEX DM) 30-600 MG 12hr tablet Take 1-2 tablets by mouth 2 (two) times daily as needed for cough.     fluticasone (FLONASE) 50 MCG/ACT nasal spray      ipratropium (ATROVENT) 0.02 % nebulizer solution      metoprolol succinate (TOPROL-XL) 50 MG 24 hr tablet Take 1 tablet (50 mg total) by mouth every morning. 30 tablet 3   Multiple Vitamin (MULTIVITAMIN WITH MINERALS) TABS tablet Take 1 tablet by mouth every morning.     Nutritional Supplements (CARNATION BREAKFAST ESSENTIALS PO) Take 1 Container by mouth 2 (two) times daily.      acetaminophen (TYLENOL) 325 MG tablet Take 2 tablets (650  mg total) by mouth every 6 (six) hours as needed. (Patient not taking: Reported on 10/07/2018)     budesonide (PULMICORT) 0.5 MG/2ML nebulizer solution Take 2 mLs (0.5 mg total) by nebulization 2 (two) times daily. (Patient not taking: Reported on 10/07/2018) 56 mL 1   clopidogrel (PLAVIX) 75 MG tablet Take 1 tablet (75 mg total) by mouth daily. (Patient not taking: Reported on 10/07/2018) 30 tablet 2   ipratropium-albuterol (DUONEB) 0.5-2.5 (3) MG/3ML SOLN Take 3 mLs by nebulization 3 (three) times daily. (Patient not taking: Reported on 10/07/2018) 360 mL 0   saccharomyces boulardii (FLORASTOR) 250 MG capsule Take 1 capsule (250 mg total) by mouth 2 (two) times daily. You can get a probiotic over the counter. (Patient not taking: Reported on 10/07/2018)     No current facility-administered medications for this visit.     REVIEW OF SYSTEMS:  [X]  denotes positive finding, [ ]  denotes negative finding Vascular    Leg swelling    Cardiac    Chest pain or chest pressure:    Shortness of breath upon exertion:    Short of breath when lying flat:    Irregular heart rhythm:    Constitutional    Fever or chills:     PHYSICAL EXAM:   Vitals:   10/20/18 0856  BP: (!) 151/93  Pulse: 77  Resp: 20  Temp: (!) 97.2 F (36.2 C)  SpO2: 99%  Weight: 83 lb 11.2 oz (38 kg)  Height: 5\' 3"  (1.6 m)   Body mass index is 14.83 kg/m.   GENERAL: The patient is a well-nourished male, in no acute distress. The vital signs are documented above. CARDIOVASCULAR: There is a regular rate and rhythm. PULMONARY: There is good air exchange bilaterally without wheezing or rales. VASCULAR: He has a palpable right femoral pulse and a brisk posterior tibial and dorsalis pedis signal on the right. On the left side I cannot palpate a femoral, popliteal or pedal pulses.  He has a monophasic posterior tibial, dorsalis pedis, and peroneal signal with the Doppler.  This was an improvement compared to last time.  He only  had a posterior tibial signal last time. EXTREMITIES: He has a 2 mm wound on his heel which is dry with no drainage.  He has a 2 mm wound on his medial first metatarsal head which is dry with no drainage.  He has a 2 mm wound on his lateral malleolus which is dry with no drainage.  DATA:   No new data  MEDICAL ISSUES:   PERIPHERAL VASCULAR DISEASE: This patient has multilevel arterial occlusive disease on the left.  As discussed above his only option for revascularization would be a right to left femorofemoral bypass with a left femoropopliteal bypass.  This would be associated with increased risk for multiple medical reasons  as described above.  I would favor conservative treatment given that his symptoms are stable.  We have again discussed the importance of tobacco cessation.  He understands he needs to try to get off tobacco completely given the effect that nicotine has on the microcirculation.  In addition I encouraged him to walk as much as possible.  I have encouraged him to eat lots of vegetables.  I will plan on seeing him back in 3 months.  I ordered ABIs for that time.  He knows to call sooner if he has problems.  Deitra Mayo [facility] [phone]

## 2018-10-30 ENCOUNTER — Ambulatory Visit
Admission: RE | Admit: 2018-10-30 | Discharge: 2018-10-30 | Disposition: A | Discharge status: Home / Self Care | Payer: Medicare Other | Source: Ambulatory Visit | Attending: Neurology | Admitting: Neurology

## 2018-10-30 ENCOUNTER — Other Ambulatory Visit: Payer: Self-pay

## 2018-10-30 DIAGNOSIS — C729 Malignant neoplasm of central nervous system, unspecified: Secondary | ICD-10-CM

## 2018-10-30 MED ORDER — GADOBENATE DIMEGLUMINE 529 MG/ML IV SOLN
7.0000 mL | Freq: Once | INTRAVENOUS | Status: AC | PRN
  Administered 2018-10-30: 7 mL via INTRAVENOUS

## 2018-11-10 DIAGNOSIS — K4030 Unilateral inguinal hernia, with obstruction, without gangrene, not specified as recurrent: Secondary | ICD-10-CM | POA: Diagnosis not present

## 2018-11-10 DIAGNOSIS — G9341 Metabolic encephalopathy: Secondary | ICD-10-CM | POA: Diagnosis not present

## 2018-11-10 DIAGNOSIS — J9601 Acute respiratory failure with hypoxia: Secondary | ICD-10-CM | POA: Diagnosis not present

## 2018-11-10 DIAGNOSIS — J449 Chronic obstructive pulmonary disease, unspecified: Secondary | ICD-10-CM | POA: Diagnosis not present

## 2019-01-10 ENCOUNTER — Other Ambulatory Visit: Payer: Self-pay

## 2019-01-10 DIAGNOSIS — I70299 Other atherosclerosis of native arteries of extremities, unspecified extremity: Secondary | ICD-10-CM

## 2019-01-10 DIAGNOSIS — J449 Chronic obstructive pulmonary disease, unspecified: Secondary | ICD-10-CM | POA: Diagnosis not present

## 2019-01-10 DIAGNOSIS — G9341 Metabolic encephalopathy: Secondary | ICD-10-CM | POA: Diagnosis not present

## 2019-01-10 DIAGNOSIS — J9601 Acute respiratory failure with hypoxia: Secondary | ICD-10-CM | POA: Diagnosis not present

## 2019-01-10 DIAGNOSIS — L97909 Non-pressure chronic ulcer of unspecified part of unspecified lower leg with unspecified severity: Secondary | ICD-10-CM

## 2019-01-10 DIAGNOSIS — K4030 Unilateral inguinal hernia, with obstruction, without gangrene, not specified as recurrent: Secondary | ICD-10-CM | POA: Diagnosis not present

## 2019-01-12 ENCOUNTER — Ambulatory Visit (HOSPITAL_COMMUNITY): Payer: Medicare Other | Attending: Family

## 2019-01-12 ENCOUNTER — Ambulatory Visit: Payer: Medicare Other | Admitting: Vascular Surgery

## 2019-01-19 ENCOUNTER — Ambulatory Visit: Payer: Medicare Other | Admitting: Vascular Surgery

## 2019-01-19 ENCOUNTER — Encounter (HOSPITAL_COMMUNITY): Payer: Medicare Other

## 2019-01-19 DIAGNOSIS — K921 Melena: Secondary | ICD-10-CM | POA: Diagnosis not present

## 2019-01-19 DIAGNOSIS — K648 Other hemorrhoids: Secondary | ICD-10-CM | POA: Diagnosis not present

## 2019-01-31 DIAGNOSIS — K5730 Diverticulosis of large intestine without perforation or abscess without bleeding: Secondary | ICD-10-CM | POA: Diagnosis not present

## 2019-01-31 DIAGNOSIS — Z980 Intestinal bypass and anastomosis status: Secondary | ICD-10-CM | POA: Diagnosis not present

## 2019-01-31 DIAGNOSIS — Z9049 Acquired absence of other specified parts of digestive tract: Secondary | ICD-10-CM | POA: Diagnosis not present

## 2019-01-31 DIAGNOSIS — K5790 Diverticulosis of intestine, part unspecified, without perforation or abscess without bleeding: Secondary | ICD-10-CM | POA: Insufficient documentation

## 2019-01-31 DIAGNOSIS — K921 Melena: Secondary | ICD-10-CM | POA: Diagnosis not present

## 2019-02-01 ENCOUNTER — Telehealth: Payer: Self-pay | Admitting: *Deleted

## 2019-02-01 NOTE — Telephone Encounter (Signed)
CALLED PATIENT TO INFORM OF LAB ON 02-09-19 @ [place] @ 10:45 AM AND HIS CT ON 02-09-19 - ARRIVAL TIME - 11:45 AM @ [facility], PT. TO HAVE WATER ONLY - 4 HRS. PRIOR TO TEST, PT. TO RECEIVE RESULTS FROM ASHLYN BRUNING ON 02-10-19 VIA TELEPHONE, LVM FOR  A RETURN CALL

## 2019-02-08 ENCOUNTER — Telehealth: Payer: Self-pay | Admitting: *Deleted

## 2019-02-08 NOTE — Telephone Encounter (Signed)
RETURNED PATIENT'S PHONE CALL, SPOKE WITH [name]

## 2019-02-09 ENCOUNTER — Ambulatory Visit (HOSPITAL_COMMUNITY): Admission: RE | Admit: 2019-02-09 | Payer: Medicare Other | Source: Ambulatory Visit

## 2019-02-09 ENCOUNTER — Ambulatory Visit: Payer: Medicare Other

## 2019-02-10 ENCOUNTER — Ambulatory Visit: Payer: Medicare Other | Admitting: Urology

## 2019-02-10 DIAGNOSIS — J449 Chronic obstructive pulmonary disease, unspecified: Secondary | ICD-10-CM | POA: Diagnosis not present

## 2019-02-10 DIAGNOSIS — K4030 Unilateral inguinal hernia, with obstruction, without gangrene, not specified as recurrent: Secondary | ICD-10-CM | POA: Diagnosis not present

## 2019-02-10 DIAGNOSIS — J9601 Acute respiratory failure with hypoxia: Secondary | ICD-10-CM | POA: Diagnosis not present

## 2019-02-10 DIAGNOSIS — G9341 Metabolic encephalopathy: Secondary | ICD-10-CM | POA: Diagnosis not present

## 2019-02-17 ENCOUNTER — Telehealth: Payer: Self-pay

## 2019-02-17 NOTE — Telephone Encounter (Signed)
Telephone encounter:  Reason for call: Pt mention that he is bleeding from his rectal and he thiks it is due to his Plavix 75mg . Pt would like to stop it. Please advice  Usual provider: [name] MD  Last office visit: 09/20/2018  Next office visit: NA   Last hospitalization: 07/2018   Current Outpatient Medications on File Prior to Visit  Medication Sig Dispense Refill  . acetaminophen (TYLENOL) 325 MG tablet Take 2 tablets (650 mg total) by mouth every 6 (six) hours as needed. (Patient not taking: Reported on 10/07/2018)    . albuterol (PROVENTIL) (2.5 MG/3ML) 0.083% nebulizer solution Take 3 mLs (2.5 mg total) by nebulization every 2 (two) hours as needed for wheezing or shortness of breath. 75 mL 1  . amLODipine (NORVASC) 10 MG tablet Take 10 mg by mouth every morning.     . budesonide (PULMICORT) 0.5 MG/2ML nebulizer solution Take 2 mLs (0.5 mg total) by nebulization 2 (two) times daily. (Patient not taking: Reported on 10/07/2018) 56 mL 1  . buPROPion (WELLBUTRIN XL) 150 MG 24 hr tablet Take 150 mg by mouth every morning.     . clopidogrel (PLAVIX) 75 MG tablet Take 1 tablet (75 mg total) by mouth daily. (Patient not taking: Reported on 10/07/2018) 30 tablet 2  . dextromethorphan-guaiFENesin (MUCINEX DM) 30-600 MG 12hr tablet Take 1-2 tablets by mouth 2 (two) times daily as needed for cough.    . fluticasone (FLONASE) 50 MCG/ACT nasal spray     . ipratropium (ATROVENT) 0.02 % nebulizer solution     . ipratropium-albuterol (DUONEB) 0.5-2.5 (3) MG/3ML SOLN Take 3 mLs by nebulization 3 (three) times daily. (Patient not taking: Reported on 10/07/2018) 360 mL 0  . metoprolol succinate (TOPROL-XL) 50 MG 24 hr tablet Take 1 tablet (50 mg total) by mouth every morning. 30 tablet 3  . Multiple Vitamin (MULTIVITAMIN WITH MINERALS) TABS tablet Take 1 tablet by mouth every morning.    . Nutritional Supplements (CARNATION BREAKFAST ESSENTIALS PO) Take 1 Container by mouth 2 (two) times daily.     Marland Kitchen  saccharomyces boulardii (FLORASTOR) 250 MG capsule Take 1 capsule (250 mg total) by mouth 2 (two) times daily. You can get a probiotic over the counter. (Patient not taking: Reported on 10/07/2018)     No current facility-administered medications on file prior to visit.

## 2019-02-17 NOTE — Telephone Encounter (Signed)
Pt was informed to stop the Plavix. Pt understood

## 2019-02-17 NOTE — Telephone Encounter (Signed)
Please stop plavix for now. Contact GI provider if bleeding persists.  Thanks MJP

## 2019-02-21 ENCOUNTER — Ambulatory Visit
Admission: RE | Admit: 2019-02-21 | Discharge: 2019-02-21 | Disposition: A | Discharge status: Home / Self Care | Payer: Medicare Other | Source: Ambulatory Visit | Attending: Urology | Admitting: Urology

## 2019-02-21 ENCOUNTER — Ambulatory Visit (HOSPITAL_COMMUNITY)
Admission: RE | Admit: 2019-02-21 | Discharge: 2019-02-21 | Disposition: A | Discharge status: Home / Self Care | Payer: Medicare Other | Source: Ambulatory Visit | Attending: Urology | Admitting: Urology

## 2019-02-21 ENCOUNTER — Ambulatory Visit (HOSPITAL_COMMUNITY): Payer: Medicare Other

## 2019-02-21 ENCOUNTER — Other Ambulatory Visit: Payer: Self-pay | Admitting: Urology

## 2019-02-21 ENCOUNTER — Other Ambulatory Visit: Payer: Self-pay

## 2019-02-21 DIAGNOSIS — C3411 Malignant neoplasm of upper lobe, right bronchus or lung: Secondary | ICD-10-CM

## 2019-02-21 DIAGNOSIS — C3412 Malignant neoplasm of upper lobe, left bronchus or lung: Secondary | ICD-10-CM | POA: Diagnosis not present

## 2019-02-21 LAB — BUN & CREATININE (CHCC)
BUN: 10 mg/dL (ref 8–23)
Creatinine: 0.75 mg/dL (ref 0.61–1.24)
GFR, Est AFR Am: >60 mL/min (ref >60)
GFR, Estimated: >60 mL/min (ref >60)

## 2019-02-21 MED ORDER — IOHEXOL 300 MG/ML  SOLN: 75.0000 mL | Freq: Once | INTRAMUSCULAR | Status: DC | PRN

## 2019-02-21 MED ORDER — SODIUM CHLORIDE (PF) 0.9 % IJ SOLN
INTRAMUSCULAR | Status: AC
  Filled 2019-02-21: qty 50

## 2019-03-02 ENCOUNTER — Encounter: Payer: Self-pay | Admitting: Urology

## 2019-03-02 ENCOUNTER — Other Ambulatory Visit: Payer: Self-pay

## 2019-03-02 ENCOUNTER — Ambulatory Visit
Admission: RE | Admit: 2019-03-02 | Discharge: 2019-03-02 | Disposition: A | Discharge status: Home / Self Care | Payer: Medicare Other | Source: Ambulatory Visit | Attending: Urology | Admitting: Urology

## 2019-03-02 DIAGNOSIS — C3412 Malignant neoplasm of upper lobe, left bronchus or lung: Secondary | ICD-10-CM | POA: Diagnosis not present

## 2019-03-02 DIAGNOSIS — C3411 Malignant neoplasm of upper lobe, right bronchus or lung: Secondary | ICD-10-CM

## 2019-03-02 DIAGNOSIS — Z08 Encounter for follow-up examination after completed treatment for malignant neoplasm: Secondary | ICD-10-CM | POA: Diagnosis not present

## 2019-03-02 NOTE — Progress Notes (Addendum)
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    Interval Since Last Radiation:  1 year and 3 months  12/07/2017 - 12/11/2017// Definitive SBRT:  The RUL target was treated to 54 Gy in 3 fractions of 18 Gy  02/01/2015- 02/08/2015// Definitive SBRT: The LUL target(s) was treated to 54 Gy in 3 fractions of 18 Gy  Narrative:  I spoke with the patient and his significan other, [name], to conduct his routine scheduled 6 month follow up visit via telephone to spare the patient unnecessary potential exposure in the healthcare setting during the current COVID-19 pandemic.  The patient was notified in advance and gave permission to proceed with this visit format.  His follow up CT chest scans since the time of his last treatment have continued to show overall disease stability without any overtly pathologic appearing adenopathy and no new lung lesions.  Today, we reviewed his most recent scan from 02/21/19 which again, shows continued stability of disease with radiaton changes noted in the RUL, LUL and superior segment of the LLL.  There is a 6 mm right middle lobe nodule which is slightly more conspicuous than on the prior prior scan so we will continue to monitor this closely on follow up scans.                            On review of systems, the patient states that he is feeling well in general and has had no recent changes in his breathing.  He specifically denies productive cough, hemoptysis, fever, chills or chest pain.  He has not noticed any recent increase in his shortness of breath with  exertion or significant decrease in his energy level.  He denies any recent unintentional weight loss.  He reports a healthy appetite and is maintaining his weight.   ALLERGIES:  is allergic to chantix [varenicline] and lansoprazole.  Meds: Current Outpatient Medications  Medication Sig Dispense Refill  . acetaminophen (TYLENOL) 325 MG tablet Take 2 tablets (650 mg total) by mouth every 6 (six) hours as needed.    Marland Kitchen albuterol (PROVENTIL) (2.5 MG/3ML) 0.083% nebulizer solution Take 3 mLs (2.5 mg total) by nebulization every 2 (two) hours as needed for wheezing or shortness of breath. 75 mL 1  . albuterol (PROVENTIL) (2.5 MG/3ML) 0.083% nebulizer solution Inhale into the lungs.    Marland Kitchen amLODipine (NORVASC) 10 MG tablet Take 10 mg by mouth every morning.     Marland Kitchen azithromycin (ZITHROMAX) 250 MG tablet TAKE 2 TABLETS BY MOUTH ON DAY 1 AND THEN TAKE 1 TABLET BY MOUTH ONCE A DAY ON DAY 2 THROUGH DAY 5    . buPROPion (WELLBUTRIN XL) 150 MG 24 hr tablet Take 150 mg by mouth every morning.     Marland Kitchen dextromethorphan-guaiFENesin (MUCINEX DM) 30-600 MG 12hr tablet Take 1-2 tablets by mouth 2 (two) times daily as needed for cough.    . fluticasone (FLONASE) 50 MCG/ACT nasal spray     . ipratropium (ATROVENT) 0.02 % nebulizer solution     . ipratropium-albuterol (DUONEB) 0.5-2.5 (3) MG/3ML SOLN Take 3  mLs by nebulization 3 (three) times daily. 360 mL 0  . metoprolol succinate (TOPROL-XL) 50 MG 24 hr tablet Take 1 tablet (50 mg total) by mouth every morning. 30 tablet 3  . Multiple Vitamin (MULTIVITAMIN WITH MINERALS) TABS tablet Take 1 tablet by mouth every morning.    . saccharomyces boulardii (FLORASTOR) 250 MG capsule Take 1 capsule (250 mg total) by mouth 2 (two) times daily. You can get a probiotic over the counter.    . budesonide (PULMICORT) 0.5 MG/2ML nebulizer solution Take 2 mLs (0.5 mg total) by nebulization 2 (two) times daily. (Patient not taking: Reported on 10/07/2018) 56 mL 1  . clopidogrel (PLAVIX) 75  MG tablet Take 1 tablet (75 mg total) by mouth daily. (Patient not taking: Reported on 10/07/2018) 30 tablet 2  . Nutritional Supplements (CARNATION BREAKFAST ESSENTIALS PO) Take 1 Container by mouth 2 (two) times daily.      No current facility-administered medications for this encounter.     Physical Findings:  vitals were not taken for this visit.   /Unable to assess due to telephone follow up visit format.  Lab Findings: Lab Results  Component Value Date   WBC 4.7 07/07/2018   HGB 9.6 (L) 07/07/2018   HCT 29.8 (L) 07/07/2018   MCV 98.0 07/07/2018   PLT 187 07/07/2018     Radiographic Findings: Ct Chest Wo Contrast  Result Date: 02/21/2019 CLINICAL DATA:  Follow-up lung cancer, radiation complete EXAM: CT CHEST WITHOUT CONTRAST TECHNIQUE: Multidetector CT imaging of the chest was performed following the standard protocol without IV contrast. COMPARISON:  CT chest dated 08/09/2018 FINDINGS: Cardiovascular: Heart is normal in size.  No pericardial effusion. No evidence of thoracic aortic aneurysm. Atherosclerotic calcifications of the aortic arch. Three vessel coronary atherosclerosis. Mediastinum/Nodes: Small mediastinal lymph nodes, including AP window nodes measuring up to 6 mm. Visualized thyroid is unremarkable. Lungs/Pleura: Biapical pleural-parenchymal scarring. Radiation changes in the posterior left upper lobe and superior segment left lower lobe (series 5/image 55). Moderate centrilobular and paraseptal emphysematous changes, upper lung predominant. 6 mm irregular subpleural nodule in the lateral right lung apex (series 5/image 18), unchanged. Mild subpleural patchy opacity in the posterior right upper lobe (series 5/image 32), nonspecific, similar to the prior. Mild patchy opacity in the medial left lower lobe (series 5/image 86), similar to the prior. Stable mixed density nodule in the anterior aspect of the left lower lobe, including a dominant 9 mm solid nodule (series 5/image  103), unchanged. 6 mm nodule in the medial right middle lobe (series 5/image 109), more conspicuous than on the prior. Calcified granuloma in the lingula and anterior right lower lobe, benign. No focal consolidation. No pleural effusion or pneumothorax. Upper Abdomen: Visualized upper abdomen is notable for prior cholecystectomy, sequela of chronic calcific pancreatitis, and vascular calcifications. Musculoskeletal: Visualized osseous structures are within normal limits. IMPRESSION: Radiation changes in the left upper lobe and superior segment left lower lobe. 6 mm right middle lobe nodule, more conspicuous than on the prior. Additional bilateral pulmonary nodules/opacities are grossly unchanged. Continued attention on follow-up is suggested. Aortic Atherosclerosis (ICD10-I70.0) and Emphysema (ICD10-J43.9). Electronically Signed   By: Julian Hy M.D.   On: 02/21/2019 17:13    Impression/Plan: 68. 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   He continues to remain stable both clinically and radiographically- currently without complaints.  We discussed the plan for a repeat CT chest in 6 months and a follow-up thereafter to review the results and  recommendations.  They are in agreement with this plan and know to call at anytime in the interim with any questions or concerns.    Given current concerns for patient exposure during the COVID-19 pandemic, this encounter was conducted via telephone. The patient was notified in advance and was offered a WebEX/MyChart video enabled meeting to allow for face to face communication but unfortunately reported that he did not have the appropriate resources/technology to support such a visit and instead preferred to proceed with telephone visit. The patient has given verbal consent for this type of encounter. The time spent during this encounter was 20 minutes. The attendants for this  meeting include Pama Roskos PA-C, patient, [name] and his significant other, [name]. During the encounter, Keyshon Stein PA-C, was located at [facility] Radiation Oncology Department.  Patient, [name] and significant other,[name] were located at home.    Nicholos Johns, PA-C

## 2019-03-12 DIAGNOSIS — K4030 Unilateral inguinal hernia, with obstruction, without gangrene, not specified as recurrent: Secondary | ICD-10-CM | POA: Diagnosis not present

## 2019-03-12 DIAGNOSIS — J9601 Acute respiratory failure with hypoxia: Secondary | ICD-10-CM | POA: Diagnosis not present

## 2019-03-12 DIAGNOSIS — J449 Chronic obstructive pulmonary disease, unspecified: Secondary | ICD-10-CM | POA: Diagnosis not present

## 2019-03-12 DIAGNOSIS — G9341 Metabolic encephalopathy: Secondary | ICD-10-CM | POA: Diagnosis not present

## 2019-04-10 DIAGNOSIS — Z20828 Contact with and (suspected) exposure to other viral communicable diseases: Secondary | ICD-10-CM | POA: Diagnosis not present

## 2019-04-11 DIAGNOSIS — Z0000 Encounter for general adult medical examination without abnormal findings: Secondary | ICD-10-CM | POA: Diagnosis not present

## 2019-04-11 DIAGNOSIS — E039 Hypothyroidism, unspecified: Secondary | ICD-10-CM | POA: Diagnosis not present

## 2019-04-11 DIAGNOSIS — D509 Iron deficiency anemia, unspecified: Secondary | ICD-10-CM | POA: Diagnosis not present

## 2019-04-11 DIAGNOSIS — R739 Hyperglycemia, unspecified: Secondary | ICD-10-CM | POA: Diagnosis not present

## 2019-04-11 DIAGNOSIS — I10 Essential (primary) hypertension: Secondary | ICD-10-CM | POA: Diagnosis not present

## 2019-04-12 DIAGNOSIS — J9601 Acute respiratory failure with hypoxia: Secondary | ICD-10-CM | POA: Diagnosis not present

## 2019-04-12 DIAGNOSIS — K4030 Unilateral inguinal hernia, with obstruction, without gangrene, not specified as recurrent: Secondary | ICD-10-CM | POA: Diagnosis not present

## 2019-04-12 DIAGNOSIS — J449 Chronic obstructive pulmonary disease, unspecified: Secondary | ICD-10-CM | POA: Diagnosis not present

## 2019-04-12 DIAGNOSIS — G9341 Metabolic encephalopathy: Secondary | ICD-10-CM | POA: Diagnosis not present

## 2019-04-18 DIAGNOSIS — I10 Essential (primary) hypertension: Secondary | ICD-10-CM | POA: Diagnosis not present

## 2019-04-18 DIAGNOSIS — Z23 Encounter for immunization: Secondary | ICD-10-CM | POA: Diagnosis not present

## 2019-04-18 DIAGNOSIS — J449 Chronic obstructive pulmonary disease, unspecified: Secondary | ICD-10-CM | POA: Diagnosis not present

## 2019-04-18 DIAGNOSIS — R739 Hyperglycemia, unspecified: Secondary | ICD-10-CM | POA: Diagnosis not present

## 2019-04-18 DIAGNOSIS — E039 Hypothyroidism, unspecified: Secondary | ICD-10-CM | POA: Diagnosis not present

## 2019-05-12 DIAGNOSIS — J9601 Acute respiratory failure with hypoxia: Secondary | ICD-10-CM | POA: Diagnosis not present

## 2019-05-12 DIAGNOSIS — J449 Chronic obstructive pulmonary disease, unspecified: Secondary | ICD-10-CM | POA: Diagnosis not present

## 2019-05-12 DIAGNOSIS — G9341 Metabolic encephalopathy: Secondary | ICD-10-CM | POA: Diagnosis not present

## 2019-05-12 DIAGNOSIS — K4030 Unilateral inguinal hernia, with obstruction, without gangrene, not specified as recurrent: Secondary | ICD-10-CM | POA: Diagnosis not present

## 2019-06-12 DIAGNOSIS — G9341 Metabolic encephalopathy: Secondary | ICD-10-CM | POA: Diagnosis not present

## 2019-06-12 DIAGNOSIS — K4030 Unilateral inguinal hernia, with obstruction, without gangrene, not specified as recurrent: Secondary | ICD-10-CM | POA: Diagnosis not present

## 2019-06-12 DIAGNOSIS — J449 Chronic obstructive pulmonary disease, unspecified: Secondary | ICD-10-CM | POA: Diagnosis not present

## 2019-06-12 DIAGNOSIS — J9601 Acute respiratory failure with hypoxia: Secondary | ICD-10-CM | POA: Diagnosis not present

## 2019-07-13 DIAGNOSIS — G9341 Metabolic encephalopathy: Secondary | ICD-10-CM | POA: Diagnosis not present

## 2019-07-13 DIAGNOSIS — J9601 Acute respiratory failure with hypoxia: Secondary | ICD-10-CM | POA: Diagnosis not present

## 2019-07-13 DIAGNOSIS — J449 Chronic obstructive pulmonary disease, unspecified: Secondary | ICD-10-CM | POA: Diagnosis not present

## 2019-07-13 DIAGNOSIS — K4030 Unilateral inguinal hernia, with obstruction, without gangrene, not specified as recurrent: Secondary | ICD-10-CM | POA: Diagnosis not present

## 2019-08-10 DIAGNOSIS — G9341 Metabolic encephalopathy: Secondary | ICD-10-CM | POA: Diagnosis not present

## 2019-08-10 DIAGNOSIS — J9601 Acute respiratory failure with hypoxia: Secondary | ICD-10-CM | POA: Diagnosis not present

## 2019-08-10 DIAGNOSIS — J449 Chronic obstructive pulmonary disease, unspecified: Secondary | ICD-10-CM | POA: Diagnosis not present

## 2019-08-10 DIAGNOSIS — K4030 Unilateral inguinal hernia, with obstruction, without gangrene, not specified as recurrent: Secondary | ICD-10-CM | POA: Diagnosis not present

## 2019-08-22 ENCOUNTER — Telehealth: Payer: Self-pay | Admitting: *Deleted

## 2019-08-22 NOTE — Telephone Encounter (Signed)
RETURNED PATIENT'S SGO PHONE CALL, LVM FOR A RETURN CALL

## 2019-08-26 ENCOUNTER — Telehealth: Payer: Self-pay | Admitting: *Deleted

## 2019-08-26 NOTE — Telephone Encounter (Signed)
CALLED PATIENT TO INFORM THAT FU NEEDS TO BE MOVED DUE TO TEST BEING ON 08-31-19, FU MOVED TO 09-01-19 @ 3 PM, PATIENT'S SGO, (D.ODOM) AGREED TO NEW TIME AND DATE

## 2019-08-31 ENCOUNTER — Other Ambulatory Visit: Payer: Self-pay

## 2019-08-31 ENCOUNTER — Encounter (HOSPITAL_COMMUNITY): Payer: Self-pay

## 2019-08-31 ENCOUNTER — Ambulatory Visit: Payer: Medicare HMO | Admitting: Urology

## 2019-08-31 ENCOUNTER — Inpatient Hospital Stay: Payer: Medicare HMO | Attending: Urology

## 2019-08-31 ENCOUNTER — Ambulatory Visit (HOSPITAL_COMMUNITY)
Admission: RE | Admit: 2019-08-31 | Discharge: 2019-08-31 | Disposition: A | Discharge status: Home / Self Care | Payer: Medicare HMO | Source: Ambulatory Visit | Attending: Urology | Admitting: Urology

## 2019-08-31 DIAGNOSIS — C3411 Malignant neoplasm of upper lobe, right bronchus or lung: Secondary | ICD-10-CM | POA: Insufficient documentation

## 2019-08-31 DIAGNOSIS — C3412 Malignant neoplasm of upper lobe, left bronchus or lung: Secondary | ICD-10-CM

## 2019-08-31 DIAGNOSIS — C3490 Malignant neoplasm of unspecified part of unspecified bronchus or lung: Secondary | ICD-10-CM | POA: Diagnosis not present

## 2019-08-31 LAB — BUN & CREATININE (CHCC)
BUN: 25 mg/dL — ABNORMAL HIGH (ref 8–23)
Creatinine: 0.93 mg/dL (ref 0.61–1.24)
GFR, Est AFR Am: >60 mL/min (ref >60)
GFR, Estimated: >60 mL/min (ref >60)

## 2019-08-31 MED ORDER — SODIUM CHLORIDE (PF) 0.9 % IJ SOLN
INTRAMUSCULAR | Status: AC
  Filled 2019-08-31: qty 50

## 2019-08-31 MED ORDER — IOHEXOL 300 MG/ML  SOLN
75.0000 mL | Freq: Once | INTRAMUSCULAR | Status: AC | PRN
  Administered 2019-08-31: 16:00:00 75 mL via INTRAVENOUS

## 2019-09-01 ENCOUNTER — Ambulatory Visit
Admission: RE | Admit: 2019-09-01 | Discharge: 2019-09-01 | Disposition: A | Discharge status: Home / Self Care | Payer: Medicare HMO | Source: Ambulatory Visit | Attending: Urology | Admitting: Urology

## 2019-09-01 DIAGNOSIS — C3412 Malignant neoplasm of upper lobe, left bronchus or lung: Secondary | ICD-10-CM

## 2019-09-01 DIAGNOSIS — C3411 Malignant neoplasm of upper lobe, right bronchus or lung: Secondary | ICD-10-CM

## 2019-09-01 DIAGNOSIS — Z08 Encounter for follow-up examination after completed treatment for malignant neoplasm: Secondary | ICD-10-CM | POA: Diagnosis not present

## 2019-09-01 NOTE — Progress Notes (Signed)
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    Interval Since Last Radiation:  1 year and 9 months  12/07/2017 - 12/11/2017// Definitive SBRT:  The RUL target was treated to 54 Gy in 3 fractions of 18 Gy  02/01/2015- 02/08/2015// Definitive SBRT: The LUL target(s) was treated to 54 Gy in 3 fractions of 18 Gy  Narrative:  I spoke with the patient and his significan other, [name], to conduct his routine scheduled 6 month follow up visit via telephone to spare the patient unnecessary potential exposure in the healthcare setting during the current COVID-19 pandemic.  The patient was notified in advance and gave permission to proceed with this visit format.  His follow up CT chest scans since the time of his last treatment have continued to show overall disease stability without any overtly pathologic appearing adenopathy and no new lung lesions.  Today, we reviewed his most recent scan from 08/31/19 which again, shows continued stability of disease with radiaton changes noted in the RUL, LUL and superior segment of the LLL.  The 6 mm right middle lobe nodule that was noted slightly more conspicuous on his previous scan in 02/2019 appears stable.  However, a mixed attenuation nodule in the left lower lobe, seen previously, has now become more confluent in the interval although size is stable at about 9 mm. Recommendation is to follow closely with a repeat Chest CT in 3-4 months.                            On review of systems, the patient states that he is feeling well in general and has had no  recent changes in his breathing.  He specifically denies productive cough, hemoptysis, fever, chills or chest pain.  He has not noticed any recent increase in his shortness of breath with exertion or significant decrease in his energy level.  He denies any recent unintentional weight loss.  He reports a healthy appetite and is maintaining his weight.   ALLERGIES:  is allergic to chantix [varenicline] and lansoprazole.  Meds: Current Outpatient Medications  Medication Sig Dispense Refill  . acetaminophen (TYLENOL) 325 MG tablet Take 2 tablets (650 mg total) by mouth every 6 (six) hours as needed.    Marland Kitchen albuterol (PROVENTIL) (2.5 MG/3ML) 0.083% nebulizer solution Take 3 mLs (2.5 mg total) by nebulization every 2 (two) hours as needed for wheezing or shortness of breath. 75 mL 1  . albuterol (PROVENTIL) (2.5 MG/3ML) 0.083% nebulizer solution Inhale into the lungs.    Marland Kitchen amLODipine (NORVASC) 10 MG tablet Take 10 mg by mouth every morning.     Marland Kitchen azithromycin (ZITHROMAX) 250 MG tablet TAKE 2 TABLETS BY MOUTH ON DAY 1 AND THEN TAKE 1 TABLET BY MOUTH ONCE A DAY ON DAY 2 THROUGH DAY 5    . budesonide (PULMICORT) 0.5 MG/2ML nebulizer solution Take 2 mLs (0.5 mg total) by nebulization 2 (two) times daily. (Patient not taking: Reported on 10/07/2018) 56 mL 1  . buPROPion (WELLBUTRIN XL) 150 MG 24 hr tablet Take 150 mg by mouth every  morning.     Marland Kitchen dextromethorphan-guaiFENesin (MUCINEX DM) 30-600 MG 12hr tablet Take 1-2 tablets by mouth 2 (two) times daily as needed for cough.    . fluticasone (FLONASE) 50 MCG/ACT nasal spray     . ipratropium (ATROVENT) 0.02 % nebulizer solution     . ipratropium-albuterol (DUONEB) 0.5-2.5 (3) MG/3ML SOLN Take 3 mLs by nebulization 3 (three) times daily. 360 mL 0  . metoprolol succinate (TOPROL-XL) 50 MG 24 hr tablet Take 1 tablet (50 mg total) by mouth every morning. 30 tablet 3  . Multiple Vitamin (MULTIVITAMIN WITH MINERALS) TABS tablet Take 1 tablet by mouth every morning.     . Nutritional Supplements (CARNATION BREAKFAST ESSENTIALS PO) Take 1 Container by mouth 2 (two) times daily.     Marland Kitchen saccharomyces boulardii (FLORASTOR) 250 MG capsule Take 1 capsule (250 mg total) by mouth 2 (two) times daily. You can get a probiotic over the counter.     No current facility-administered medications for this encounter.    Physical Findings:  vitals were not taken for this visit.   /Unable to assess due to telephone follow up visit format.  Lab Findings: Lab Results  Component Value Date   WBC 4.7 07/07/2018   HGB 9.6 (L) 07/07/2018   HCT 29.8 (L) 07/07/2018   MCV 98.0 07/07/2018   PLT 187 07/07/2018     Radiographic Findings: CT Chest W Contrast  Result Date: 09/01/2019 CLINICAL DATA:  Non-small-cell lung cancer. Restaging. EXAM: CT CHEST WITH CONTRAST TECHNIQUE: Multidetector CT imaging of the chest was performed during intravenous contrast administration. CONTRAST:  48mL OMNIPAQUE IOHEXOL 300 MG/ML  SOLN COMPARISON:  02/21/2019 FINDINGS: Cardiovascular: The heart size is normal. No substantial pericardial effusion. Coronary artery calcification is evident. Atherosclerotic calcification is noted in the wall of the thoracic aorta. Mediastinum/Nodes: No mediastinal lymphadenopathy. There is no hilar lymphadenopathy. The esophagus has normal imaging features. There is no axillary lymphadenopathy. Lungs/Pleura: Centrilobular and paraseptal emphysema evident. Biapical pleuroparenchymal scarring again noted, stable bandlike region of architectural distortion/scarring noted posterior left upper lobe crossing the fissure into the superior segment the right lower lobe compatible with radiation fibrosis. Stable 6 mm nodular component of scarring in the right apex (24/5). Irregular subpleural architectural distortion in the right upper lobe posteriorly (35/5) and medial left lower lobe (88/5) is unchanged since prior. Mixed attenuation nodule left lower lobe identified on the previous  study is again identified on image 102/5. The nodular component to this lesion is become more confluent in the interval although size is stable at about 9 mm. 6 mm medial right middle lobe nodule (112/5) is stable. 4 mm retro hilar right lower lobe nodule (90/5) is stable. Additional tiny scattered bilateral pulmonary nodules are similar to prior. Calcified granuloma again noted both lower lobes. No focal airspace consolidation.  No pleural effusion. Upper Abdomen: Ill-defined areas of enhancement in the subcapsular right hepatic dome and lateral segment left liver are similar to prior CT of 08/09/2018, likely benign. Sequelae of chronic pancreatitis again noted with associated diffuse dilatation of the main pancreatic duct. Musculoskeletal: No worrisome lytic or sclerotic osseous abnormality. New compression deformity at the T8 vertebral body. IMPRESSION: 1. Nodular component of the mixed attenuation left lower lobe nodule is become more confluent in the interval although diameter has not progressed substantially. This nodule this nodule now has a more concerning appearance for neoplasm. Close follow-up recommended. Lesion is considered borderline in size for reliable resolution on PET imaging. 2. Remaining scattered bilateral pulmonary nodules  are similar to prior. 3. New mild T8 compression deformity. 4. Aortic Atherosclerosis (ICD10-I70.0) and Emphysema (ICD10-J43.9). Electronically Signed   By: Misty Stanley M.D.   On: 09/01/2019 09:28    Impression/Plan: 57. 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   He continues to remain stable both clinically and radiographically- currently without complaints.  We discussed the plan for a repeat CT chest in 3-4 months to monitor the nodule in the LLL and a follow-up thereafter to review the results and recommendations.  They are in agreement with this plan and know to call at anytime in the  interim with any questions or concerns.    Given current concerns for patient exposure during the COVID-19 pandemic, this encounter was conducted via telephone. The patient was notified in advance and was offered a WebEX/MyChart video enabled meeting to allow for face to face communication but unfortunately reported that he did not have the appropriate resources/technology to support such a visit and instead preferred to proceed with telephone visit. The patient has given verbal consent for this type of encounter. The time spent during this encounter was 20 minutes. The attendants for this meeting include Jaquese Irving PA-C, patient, [name] and his significant other, [name]. During the encounter, Santanna Whitford PA-C, was located at [facility] Radiation Oncology Department.  Patient, [name] and significant other,[name] were located at home.    Nicholos Johns, PA-C

## 2019-10-10 DIAGNOSIS — I10 Essential (primary) hypertension: Secondary | ICD-10-CM | POA: Diagnosis not present

## 2019-10-10 DIAGNOSIS — R739 Hyperglycemia, unspecified: Secondary | ICD-10-CM | POA: Diagnosis not present

## 2019-10-10 DIAGNOSIS — E039 Hypothyroidism, unspecified: Secondary | ICD-10-CM | POA: Diagnosis not present

## 2019-10-18 DIAGNOSIS — Z0000 Encounter for general adult medical examination without abnormal findings: Secondary | ICD-10-CM | POA: Diagnosis not present

## 2019-10-18 DIAGNOSIS — Z720 Tobacco use: Secondary | ICD-10-CM | POA: Diagnosis not present

## 2019-10-18 DIAGNOSIS — J449 Chronic obstructive pulmonary disease, unspecified: Secondary | ICD-10-CM | POA: Diagnosis not present

## 2019-10-18 DIAGNOSIS — I10 Essential (primary) hypertension: Secondary | ICD-10-CM | POA: Diagnosis not present

## 2019-10-24 ENCOUNTER — Encounter: Payer: Self-pay | Admitting: Podiatry

## 2019-10-24 ENCOUNTER — Ambulatory Visit (INDEPENDENT_AMBULATORY_CARE_PROVIDER_SITE_OTHER): Payer: Medicare Other | Admitting: Podiatry

## 2019-10-24 ENCOUNTER — Other Ambulatory Visit: Payer: Self-pay | Admitting: Podiatry

## 2019-10-24 ENCOUNTER — Other Ambulatory Visit: Payer: Self-pay

## 2019-10-24 ENCOUNTER — Ambulatory Visit (INDEPENDENT_AMBULATORY_CARE_PROVIDER_SITE_OTHER): Payer: Medicare Other

## 2019-10-24 VITALS — BP 154/81 | HR 67 | Temp 98.3°F | Resp 16

## 2019-10-24 DIAGNOSIS — M2011 Hallux valgus (acquired), right foot: Secondary | ICD-10-CM

## 2019-10-24 DIAGNOSIS — Z472 Encounter for removal of internal fixation device: Secondary | ICD-10-CM

## 2019-10-24 DIAGNOSIS — L84 Corns and callosities: Secondary | ICD-10-CM | POA: Diagnosis not present

## 2019-10-24 DIAGNOSIS — M2012 Hallux valgus (acquired), left foot: Secondary | ICD-10-CM

## 2019-10-24 NOTE — Progress Notes (Signed)
   Subjective:    Patient ID: [name], male    DOB: 19-Jun-1950, 69 y.o.   MRN: [id number]  HPI    Review of Systems  All other systems reviewed and are negative.      Objective:   Physical Exam        Assessment & Plan:

## 2019-10-26 NOTE — Progress Notes (Signed)
Subjective:   Patient ID: [name], male   DOB: 69 y.o.   MRN: [id number]   HPI Patient presents with painful fixation outside left ankle that had been done approximately 40 years ago.  States is becoming more aggravated and he wants the plate removed and he does also have some prominence around the bone structure.  Patient does smoke a pack per day and has 50 pack years of smoking and does like to be active if possible   Review of Systems  All other systems reviewed and are negative.       Objective:  Physical Exam Vitals and nursing note reviewed.  Constitutional:      Appearance: He is well-developed.  Pulmonary:     Effort: Pulmonary effort is normal.  Musculoskeletal:        General: Normal range of motion.  Skin:    General: Skin is warm.  Neurological:     Mental Status: He is alert.     Neurovascular status was found to be intact with muscle strength reduced range of motion reduced subtalar joint midtarsal joint with scars on the medial lateral side of the left ankle with a prominent plate on the left ankle.  There is prominence of the screw that is most likely most distal which goes over the lateral malleolus and there is keratotic tissue that is formed around it localized with no drainage erythema or edema noted.  Patient has good digital perfusion     Assessment:  Prominent plate position left with a prominent lesion secondary to pressure     Plan:  H&P conditions reviewed explained and I do think that plate removal be necessary.  Today went ahead I debrided the lesion I referred this patient out for this particular procedure there may be some challenge given the amount of years that this has been in.  Worse case scenario may be able to just take the 1 screw out that irritated  X-rays indicate that there is what appears to be prominence of the plate on the left lateral ankle

## 2019-11-10 DIAGNOSIS — K4030 Unilateral inguinal hernia, with obstruction, without gangrene, not specified as recurrent: Secondary | ICD-10-CM | POA: Diagnosis not present

## 2019-11-10 DIAGNOSIS — G9341 Metabolic encephalopathy: Secondary | ICD-10-CM | POA: Diagnosis not present

## 2019-11-10 DIAGNOSIS — J9601 Acute respiratory failure with hypoxia: Secondary | ICD-10-CM | POA: Diagnosis not present

## 2019-11-10 DIAGNOSIS — J449 Chronic obstructive pulmonary disease, unspecified: Secondary | ICD-10-CM | POA: Diagnosis not present

## 2019-12-10 DIAGNOSIS — J9601 Acute respiratory failure with hypoxia: Secondary | ICD-10-CM | POA: Diagnosis not present

## 2019-12-10 DIAGNOSIS — J449 Chronic obstructive pulmonary disease, unspecified: Secondary | ICD-10-CM | POA: Diagnosis not present

## 2019-12-10 DIAGNOSIS — K4030 Unilateral inguinal hernia, with obstruction, without gangrene, not specified as recurrent: Secondary | ICD-10-CM | POA: Diagnosis not present

## 2019-12-10 DIAGNOSIS — G9341 Metabolic encephalopathy: Secondary | ICD-10-CM | POA: Diagnosis not present

## 2019-12-27 ENCOUNTER — Telehealth: Payer: Self-pay | Admitting: *Deleted

## 2019-12-27 NOTE — Telephone Encounter (Signed)
CALLED PATIENT TO INFORM OF STAT LABS ON 01-03-20 @ 2:30 PM @ [place] CT ON 01/03/20 - ARRIVAL TIME- 3:15 PM @ [facility], PT. TO HAVE WATER ONLY - 4 HRS.  PRIOR TO TEST, PATIENT TO RECEIVE RESULTS FROM [facility]LY[facility] BRUNING ON 01-04-20 @ 1:30 PM VIA TELEPHONE, SPOKE WITH PATIENT'S SGO- [name] AND SHE VERIFIED UNDERSTANDING THESE APPTS.

## 2020-01-03 ENCOUNTER — Telehealth: Payer: Self-pay

## 2020-01-03 ENCOUNTER — Ambulatory Visit (HOSPITAL_COMMUNITY)
Admission: RE | Admit: 2020-01-03 | Discharge: 2020-01-03 | Disposition: A | Discharge status: Home / Self Care | Payer: Medicare Other | Source: Ambulatory Visit | Attending: Urology | Admitting: Urology

## 2020-01-03 ENCOUNTER — Other Ambulatory Visit: Payer: Self-pay

## 2020-01-03 ENCOUNTER — Ambulatory Visit
Admission: RE | Admit: 2020-01-03 | Discharge: 2020-01-03 | Disposition: A | Discharge status: Home / Self Care | Payer: Medicare Other | Source: Ambulatory Visit | Attending: Urology | Admitting: Urology

## 2020-01-03 DIAGNOSIS — I2510 Atherosclerotic heart disease of native coronary artery without angina pectoris: Secondary | ICD-10-CM | POA: Diagnosis not present

## 2020-01-03 DIAGNOSIS — C3412 Malignant neoplasm of upper lobe, left bronchus or lung: Secondary | ICD-10-CM

## 2020-01-03 DIAGNOSIS — I700 Atherosclerosis of aorta: Secondary | ICD-10-CM | POA: Diagnosis not present

## 2020-01-03 DIAGNOSIS — C3411 Malignant neoplasm of upper lobe, right bronchus or lung: Secondary | ICD-10-CM | POA: Diagnosis not present

## 2020-01-03 DIAGNOSIS — J8489 Other specified interstitial pulmonary diseases: Secondary | ICD-10-CM | POA: Diagnosis not present

## 2020-01-03 LAB — BUN & CREATININE (CHCC)
BUN: 20 mg/dL (ref 8–23)
Creatinine: 0.8 mg/dL (ref 0.61–1.24)
GFR, Est AFR Am: >60 mL/min (ref >60)
GFR, Estimated: >60 mL/min (ref >60)

## 2020-01-03 MED ORDER — SODIUM CHLORIDE (PF) 0.9 % IJ SOLN
INTRAMUSCULAR | Status: AC
  Filled 2020-01-03: qty 50

## 2020-01-03 MED ORDER — IOHEXOL 300 MG/ML  SOLN
75.0000 mL | Freq: Once | INTRAMUSCULAR | Status: AC | PRN
  Administered 2020-01-03: 60 mL via INTRAVENOUS

## 2020-01-03 NOTE — Telephone Encounter (Signed)
Left patient a voicemail message to call back in regards to telephone visit with Scott Caldron PA on 01/04/20 at 1:30pm. Called to review meaningful use questions prior to visit. TM

## 2020-01-04 ENCOUNTER — Ambulatory Visit
Admission: RE | Admit: 2020-01-04 | Discharge: 2020-01-04 | Disposition: A | Discharge status: Home / Self Care | Payer: Medicare Other | Source: Ambulatory Visit | Attending: Urology | Admitting: Urology

## 2020-01-04 DIAGNOSIS — C3411 Malignant neoplasm of upper lobe, right bronchus or lung: Secondary | ICD-10-CM | POA: Diagnosis not present

## 2020-01-04 DIAGNOSIS — C3412 Malignant neoplasm of upper lobe, left bronchus or lung: Secondary | ICD-10-CM

## 2020-01-04 DIAGNOSIS — Z08 Encounter for follow-up examination after completed treatment for malignant neoplasm: Secondary | ICD-10-CM | POA: Diagnosis not present

## 2020-01-04 NOTE — Progress Notes (Signed)
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  Interval Since Last Radiation:  2 years  12/07/2017 - 12/11/2017// Definitive SBRT:  The RUL target was treated to 54 Gy in 3 fractions of 18 Gy  02/01/2015- 02/08/2015// Definitive SBRT: The LUL target(s) was treated to 54 Gy in 3 fractions of 18 Gy  Narrative:  I spoke with the patient and his significan other, [name], to conduct his routine scheduled 3 month follow up visit via telephone to spare the patient unnecessary potential exposure in the healthcare setting during the current COVID-19 pandemic.  The patient was notified in advance and gave permission to proceed with this visit format.  His follow up CT chest scans since the time of his last treatment have continued to show overall disease stability without any overtly pathologic appearing adenopathy and no new lung lesions.  His prior scan from 08/31/19 continued to show stability of his previously treated disease with radiaton changes noted in the RUL, LUL and superior segment of the LLL.  A 6 mm right middle lobe nodule that was noted slightly more conspicuous on his previous scan in 02/2019 appeared stable, however, a mixed attenuation nodule in the left lower lobe, seen previously, had become more confluent in the interval although size was stable at about 9 mm. The recommendation was to follow this closely with a repeat Chest CT in 3-4 months which was recently performed on 01/03/20 and today's visit is to  review these results. This study shows the left lower lobe nodule that we were monitoring measures similar to slightly smaller today. There are scattered bilateral interstitial opacities, including new areas within the lingula and left lower lobe that the radiologist felt are most likely due to subacute on chronic infection and the patient confirmed today, that he has recently gotten over an URI that was treated by his PCP.  There was no thoracic adenopathy and only a small left mediastinal node that has increased in size minimally, felt most likely reactive.                           On review of systems, the patient states that he is feeling well in general and has had no recent changes in his breathing.  He has recently recovered from a URI that was treated by his PCP and currently denies increased shortness of breath, productive cough, hemoptysis, fever, chills or chest pain.  He denies any recent change in his energy level or unintentional weight loss.  He reports a healthy appetite and is maintaining his weight.   ALLERGIES:  is allergic to chantix [varenicline] and lansoprazole.  Meds: Current Outpatient Medications  Medication Sig Dispense Refill  . acetaminophen (TYLENOL) 325 MG tablet Take 2 tablets (650 mg total) by mouth every 6 (six) hours as needed.    Marland Kitchen albuterol (PROVENTIL) (2.5 MG/3ML) 0.083% nebulizer solution Take 3 mLs (2.5 mg total) by nebulization every 2 (two) hours as needed for wheezing or shortness of breath. 75 mL 1  .  albuterol (PROVENTIL) (2.5 MG/3ML) 0.083% nebulizer solution Inhale into the lungs.    Marland Kitchen amLODipine (NORVASC) 10 MG tablet Take 10 mg by mouth every morning.     . budesonide (PULMICORT) 0.5 MG/2ML nebulizer solution Take 2 mLs (0.5 mg total) by nebulization 2 (two) times daily. 56 mL 1  . buPROPion (WELLBUTRIN XL) 150 MG 24 hr tablet Take 150 mg by mouth every morning.     . fluticasone (FLONASE) 50 MCG/ACT nasal spray     . ipratropium (ATROVENT) 0.02 %  nebulizer solution     . ipratropium-albuterol (DUONEB) 0.5-2.5 (3) MG/3ML SOLN Take 3 mLs by nebulization 3 (three) times daily. 360 mL 0  . metoprolol succinate (TOPROL-XL) 50 MG 24 hr tablet Take 1 tablet (50 mg total) by mouth every morning. (Patient taking differently: Take 25 mg by mouth every morning. ) 30 tablet 3  . Multiple Vitamin (MULTIVITAMIN WITH MINERALS) TABS tablet Take 1 tablet by mouth every morning.    . Nutritional Supplements (CARNATION BREAKFAST ESSENTIALS PO) Take 1 Container by mouth 2 (two) times daily.      No current facility-administered medications for this encounter.    Physical Findings:  vitals were not taken for this visit.   /Unable to assess due to telephone follow up visit format.  Lab Findings: Lab Results  Component Value Date   WBC 4.7 07/07/2018   HGB 9.6 (L) 07/07/2018   HCT 29.8 (L) 07/07/2018   MCV 98.0 07/07/2018   PLT 187 07/07/2018     Radiographic Findings: No results found.  Impression/Plan: 16. 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   He continues to remain stable both clinically and radiographically- currently without complaints.  We reviewed the results from his most recent CT Chest from 01/03/20 which shows the left lower lobe nodule that we were monitoring measures similar to slightly smaller today. There are scattered bilateral interstitial opacities, including new areas within the lingula and left lower lobe that the radiologist felt are most likely due to subacute on chronic infection and the patient confirmed today, that he has recently recovered from an URI that was treated by his PCP.  There was no thoracic adenopathy and only a small left mediastinal node that has increased in size minimally, felt most likely reactive. In light of these findings, we discussed the plan to resume serial CT chest scans every 6 months for continued surveillance and a  telephone follow-up visit thereafter to review the results and recommendations.  They are in agreement with this plan and know to call at anytime in the interim with any questions or concerns.    Given current concerns for patient exposure during the COVID-19 pandemic, this encounter was conducted via telephone. The patient was notified in advance and was offered a WebEX/MyChart video enabled meeting to allow for face to face communication but unfortunately reported that he did not have the appropriate resources/technology to support such a visit and instead preferred to proceed with telephone visit. The patient has given verbal consent for this type of encounter. The time spent during this encounter was 20 minutes. The attendants for this meeting include Scott Obarr PA-C, patient, [name] and his significant other, [name]. During the encounter, Scott Lorenzetti PA-C, was located at [facility] Radiation Oncology Department.  Patient, [name] and significant other,[name] were located at home.    Scott Johns, PA-C

## 2020-01-10 DIAGNOSIS — J9601 Acute respiratory failure with hypoxia: Secondary | ICD-10-CM | POA: Diagnosis not present

## 2020-01-10 DIAGNOSIS — K4030 Unilateral inguinal hernia, with obstruction, without gangrene, not specified as recurrent: Secondary | ICD-10-CM | POA: Diagnosis not present

## 2020-01-10 DIAGNOSIS — J449 Chronic obstructive pulmonary disease, unspecified: Secondary | ICD-10-CM | POA: Diagnosis not present

## 2020-01-10 DIAGNOSIS — G9341 Metabolic encephalopathy: Secondary | ICD-10-CM | POA: Diagnosis not present

## 2020-02-10 DIAGNOSIS — K4030 Unilateral inguinal hernia, with obstruction, without gangrene, not specified as recurrent: Secondary | ICD-10-CM | POA: Diagnosis not present

## 2020-02-10 DIAGNOSIS — G9341 Metabolic encephalopathy: Secondary | ICD-10-CM | POA: Diagnosis not present

## 2020-02-10 DIAGNOSIS — J9601 Acute respiratory failure with hypoxia: Secondary | ICD-10-CM | POA: Diagnosis not present

## 2020-02-10 DIAGNOSIS — J449 Chronic obstructive pulmonary disease, unspecified: Secondary | ICD-10-CM | POA: Diagnosis not present

## 2020-04-09 DIAGNOSIS — G9341 Metabolic encephalopathy: Secondary | ICD-10-CM | POA: Diagnosis not present

## 2020-04-09 DIAGNOSIS — J9601 Acute respiratory failure with hypoxia: Secondary | ICD-10-CM | POA: Diagnosis not present

## 2020-04-09 DIAGNOSIS — J449 Chronic obstructive pulmonary disease, unspecified: Secondary | ICD-10-CM | POA: Diagnosis not present

## 2020-04-09 DIAGNOSIS — K4030 Unilateral inguinal hernia, with obstruction, without gangrene, not specified as recurrent: Secondary | ICD-10-CM | POA: Diagnosis not present

## 2020-04-17 DIAGNOSIS — Z0000 Encounter for general adult medical examination without abnormal findings: Secondary | ICD-10-CM | POA: Diagnosis not present

## 2020-04-17 DIAGNOSIS — E039 Hypothyroidism, unspecified: Secondary | ICD-10-CM | POA: Diagnosis not present

## 2020-04-17 DIAGNOSIS — I10 Essential (primary) hypertension: Secondary | ICD-10-CM | POA: Diagnosis not present

## 2020-04-17 DIAGNOSIS — R739 Hyperglycemia, unspecified: Secondary | ICD-10-CM | POA: Diagnosis not present

## 2020-05-11 DIAGNOSIS — K4030 Unilateral inguinal hernia, with obstruction, without gangrene, not specified as recurrent: Secondary | ICD-10-CM | POA: Diagnosis not present

## 2020-05-11 DIAGNOSIS — J9601 Acute respiratory failure with hypoxia: Secondary | ICD-10-CM | POA: Diagnosis not present

## 2020-05-11 DIAGNOSIS — G9341 Metabolic encephalopathy: Secondary | ICD-10-CM | POA: Diagnosis not present

## 2020-05-11 DIAGNOSIS — J449 Chronic obstructive pulmonary disease, unspecified: Secondary | ICD-10-CM | POA: Diagnosis not present

## 2020-06-04 IMAGING — CT CT ABD-PELV W/ CM
2 of 5 series · 15 of 46 positions shown, 17 images · IV contrast (ISOVUE)
Comparison: 04/08/2016

Correlation: PET-CT 10/19/2017

CLINICAL DATA: Nausea, vomiting, bowel obstruction, history COPD,
hypertension, vascular disease, pulmonary hypertension, smoker,
pancreatitis, lung cancer

EXAM:
CT ABDOMEN AND PELVIS WITH CONTRAST
TECHNIQUE: Multidetector CT imaging of the abdomen and pelvis was performed
using the standard protocol following bolus administration of
intravenous contrast. Sagittal and coronal MPR images reconstructed
from axial data set.
CONTRAST:  100mL [id number] IOPAMIDOL ([id number]) INJECTION 61% IV.
No oral contrast administered.

[Series 2: axial st · axial · 0.78mm/px · z∈[+1174,+1514]mm · 12 of 78 slices shown, 14 images]
[im 5/78  soft-tissue]
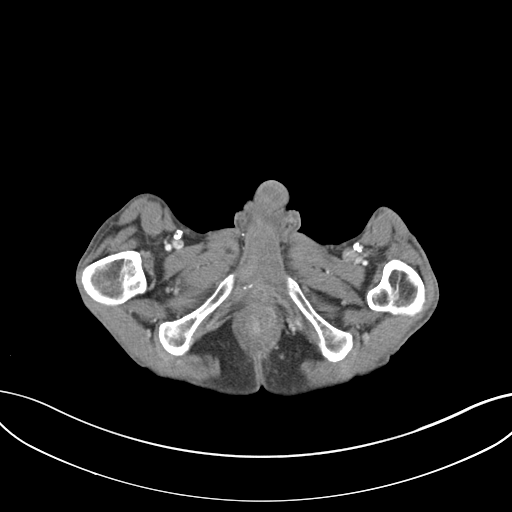
[im 5/78  bone]
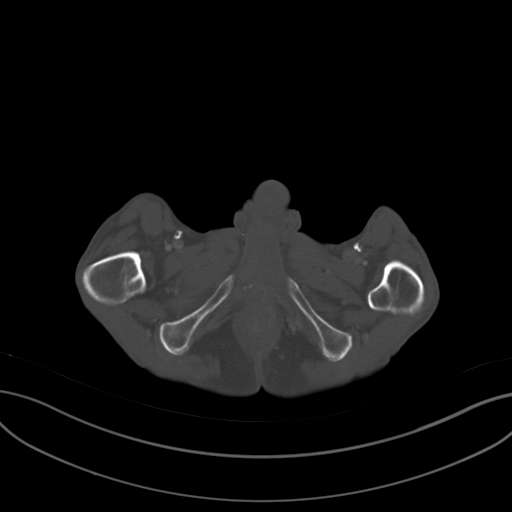
[im 10/78  soft-tissue]
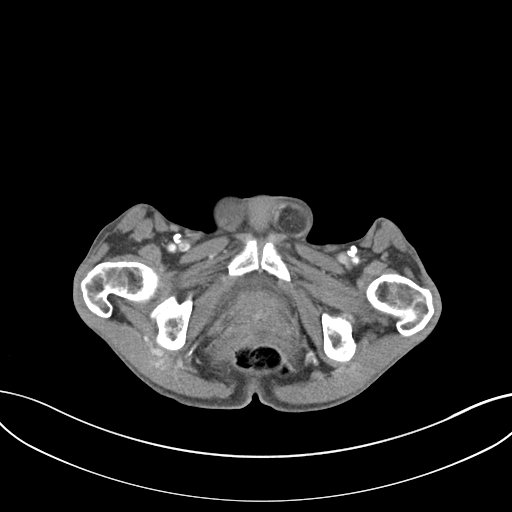
[im 20/78  soft-tissue]
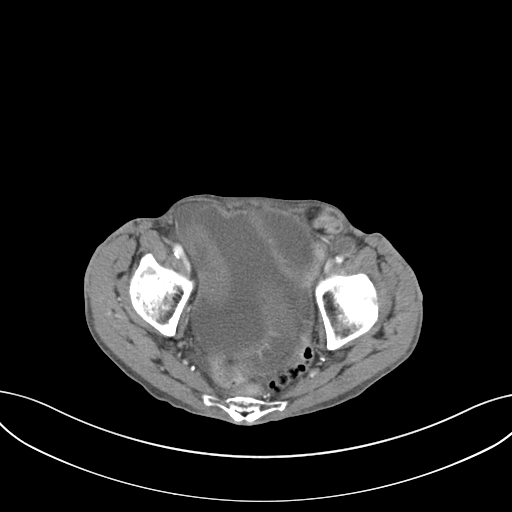
[im 25/78  soft-tissue]
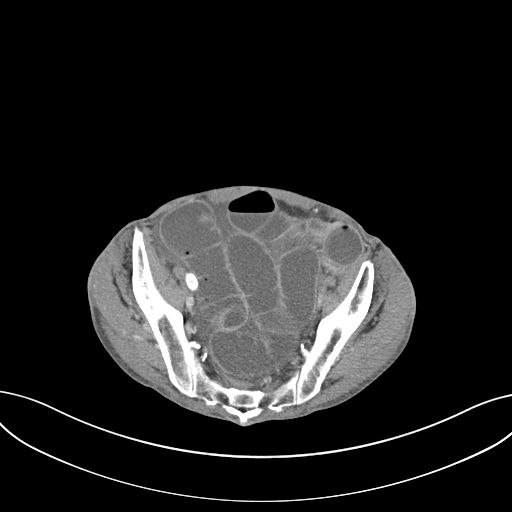
[im 29/78  soft-tissue]
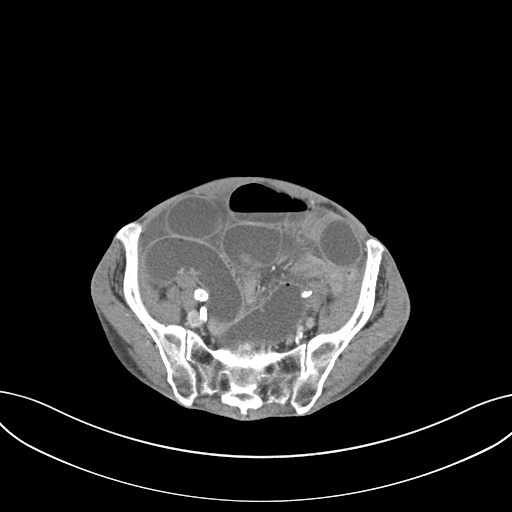
[im 34/78  soft-tissue]
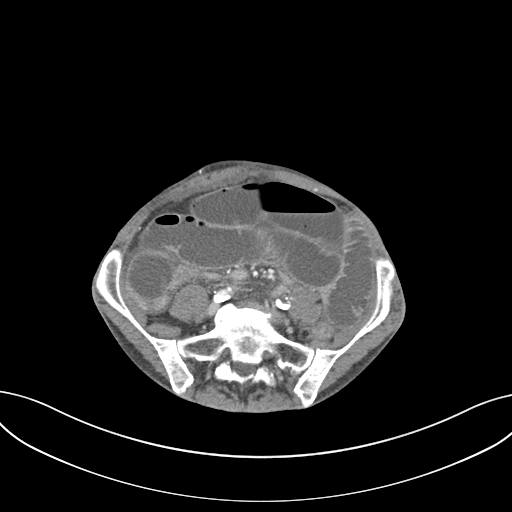
[im 44/78  soft-tissue]
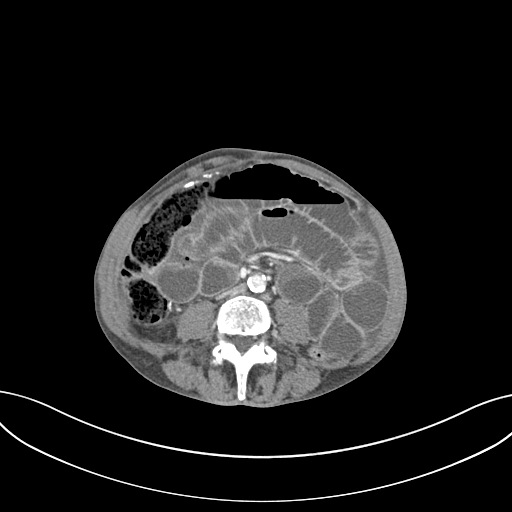
[im 49/78  soft-tissue]
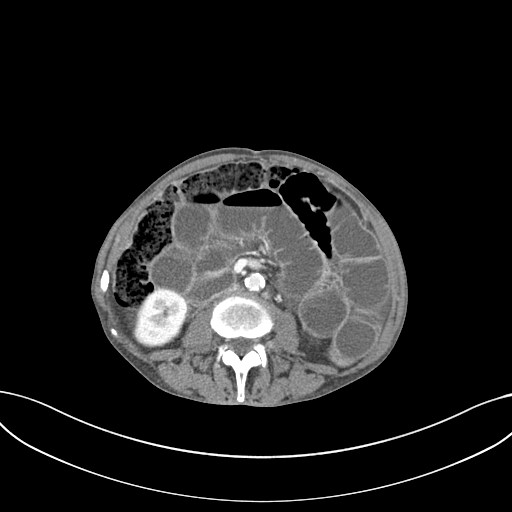
[im 53/78  soft-tissue]
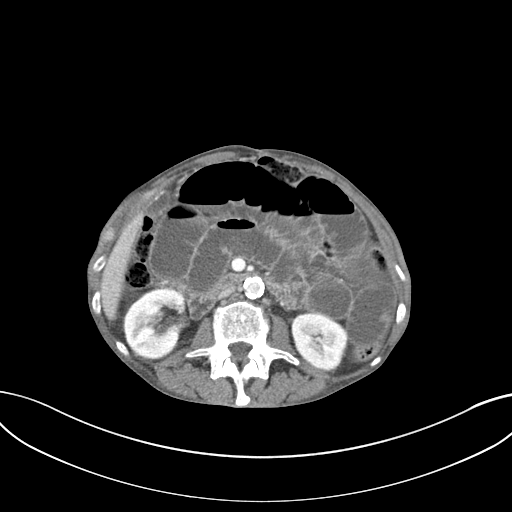
[im 53/78  bone]
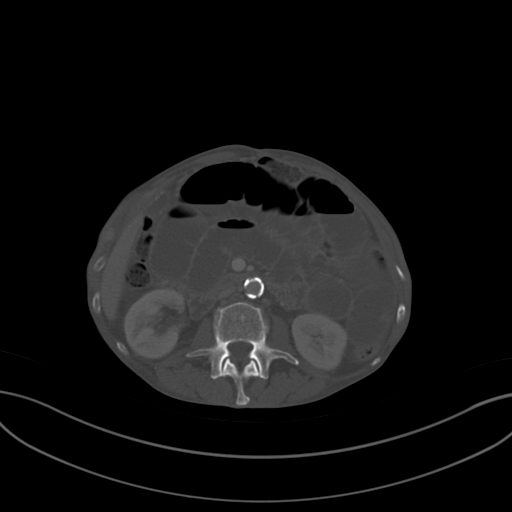
[im 58/78  soft-tissue]
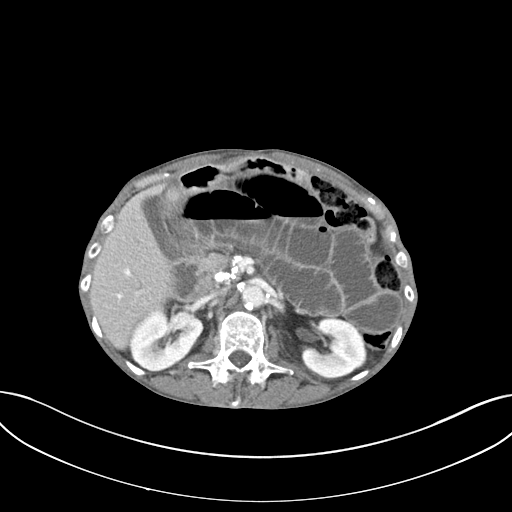
[im 68/78  soft-tissue]
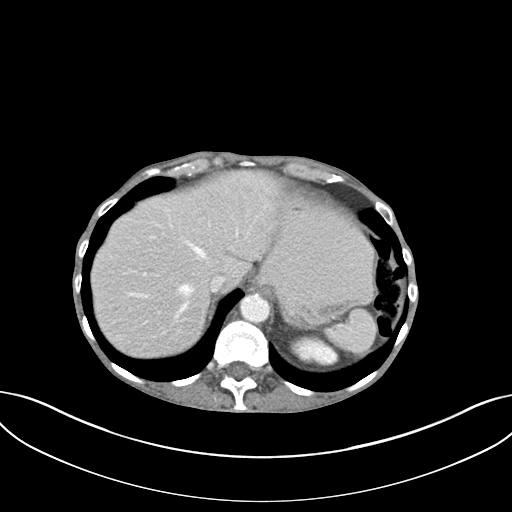
[im 73/78  soft-tissue]
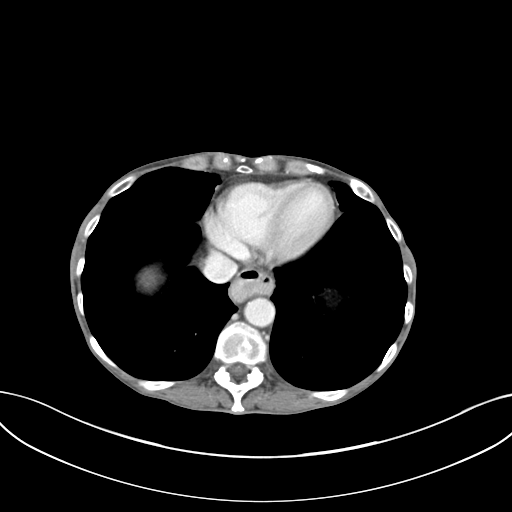

[Series 4: coronal st · coronal · 0.55mm/px · 3 of 79 slices shown]
[im 27/79  soft-tissue]
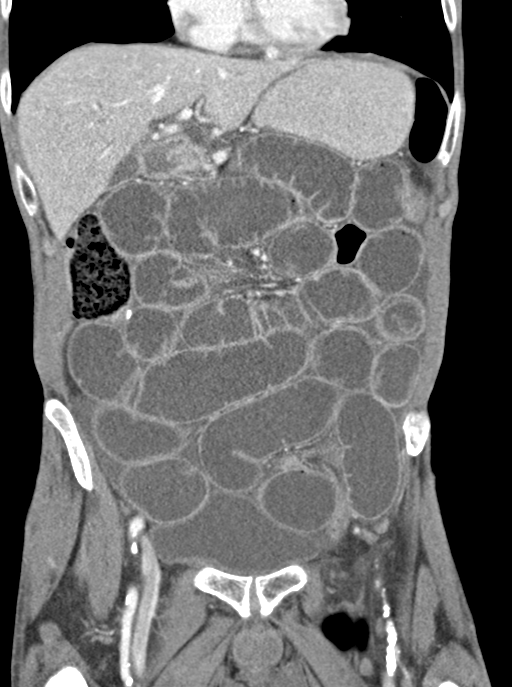
[im 35/79  soft-tissue]
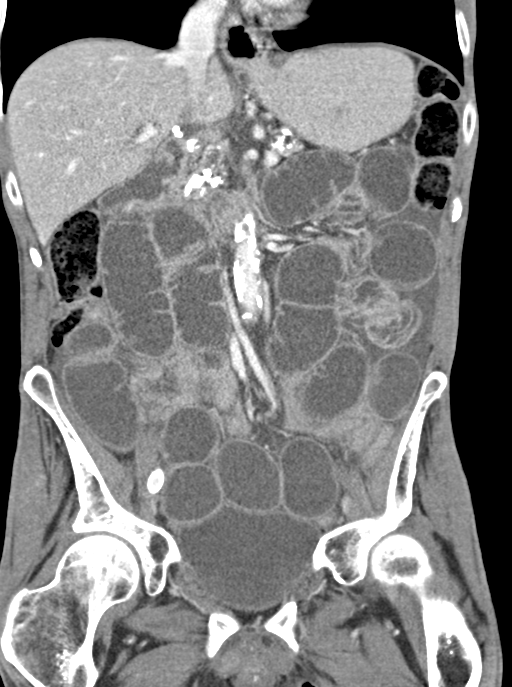
[im 44/79  soft-tissue]
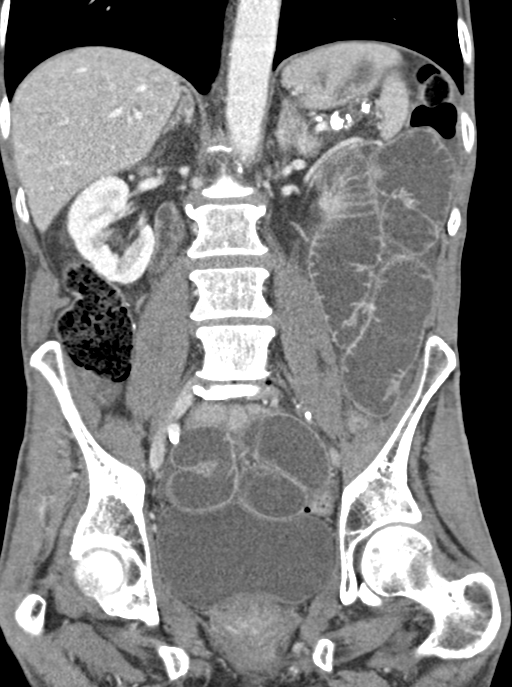

[15 of 46 positions shown; findings below may reference images not displayed]

FINDINGS: Lower chest: Lung bases appear emphysematous with a calcified
granuloma at RIGHT lung base.

Hepatobiliary: Gallbladder and liver normal appearance

Pancreas: He atrophic pancreas with numerous parenchymal
calcifications and minimal ductal dilatation consistent with chronic
calcific pancreatitis. No definite pancreatic mass.

Spleen: Small spleen without focal mass

Adrenals/Urinary Tract: Tiny RIGHT renal cyst. Adrenal glands,
kidneys, ureters and bladder otherwise unremarkable.

Stomach/Bowel: Colon decompressed. Appendix not visualized.
Distended stomach with small hiatal hernia. Numerous dilated
proximal and mid small bowel loops with decompressed distal small
bowel loops consistent with small bowel obstruction. Transition
point is at a small bowel loop located within a LEFT inguinal
hernia. Additional fat within LEFT inguinal hernia. RIGHT inguinal
hernia also present, containing ascites but no bowel. No definite
bowel wall thickening.

Vascular/Lymphatic: Extensive atherosclerotic calcifications of
aorta, visceral arteries, iliac arteries. Aorta normal caliber.
Stents identified at the RIGHT common and external iliac arteries.
No adenopathy.

Reproductive: Small cyst at posterior aspect of prostate gland
unchanged.

Other: Scattered ascites.  No free air.

Musculoskeletal: Bones demineralized.
IMPRESSION: Mid to distal small bowel obstruction secondary to an obstructed
small bowel loop within a LEFT inguinal hernia.

RIGHT inguinal hernia containing ascites but no bowel loops.

Chronic calcific pancreatitis.

Extensive atherosclerotic disease.

Small hiatal hernia.

## 2020-06-05 IMAGING — DX DG ABD PORTABLE 1V
1 series · 1 of 1 positions shown · non-contrast
Comparison: Radiograph earlier this day.

CLINICAL DATA: NG tube placement.

EXAM:
PORTABLE ABDOMEN - 1 VIEW

[abdomen kub]
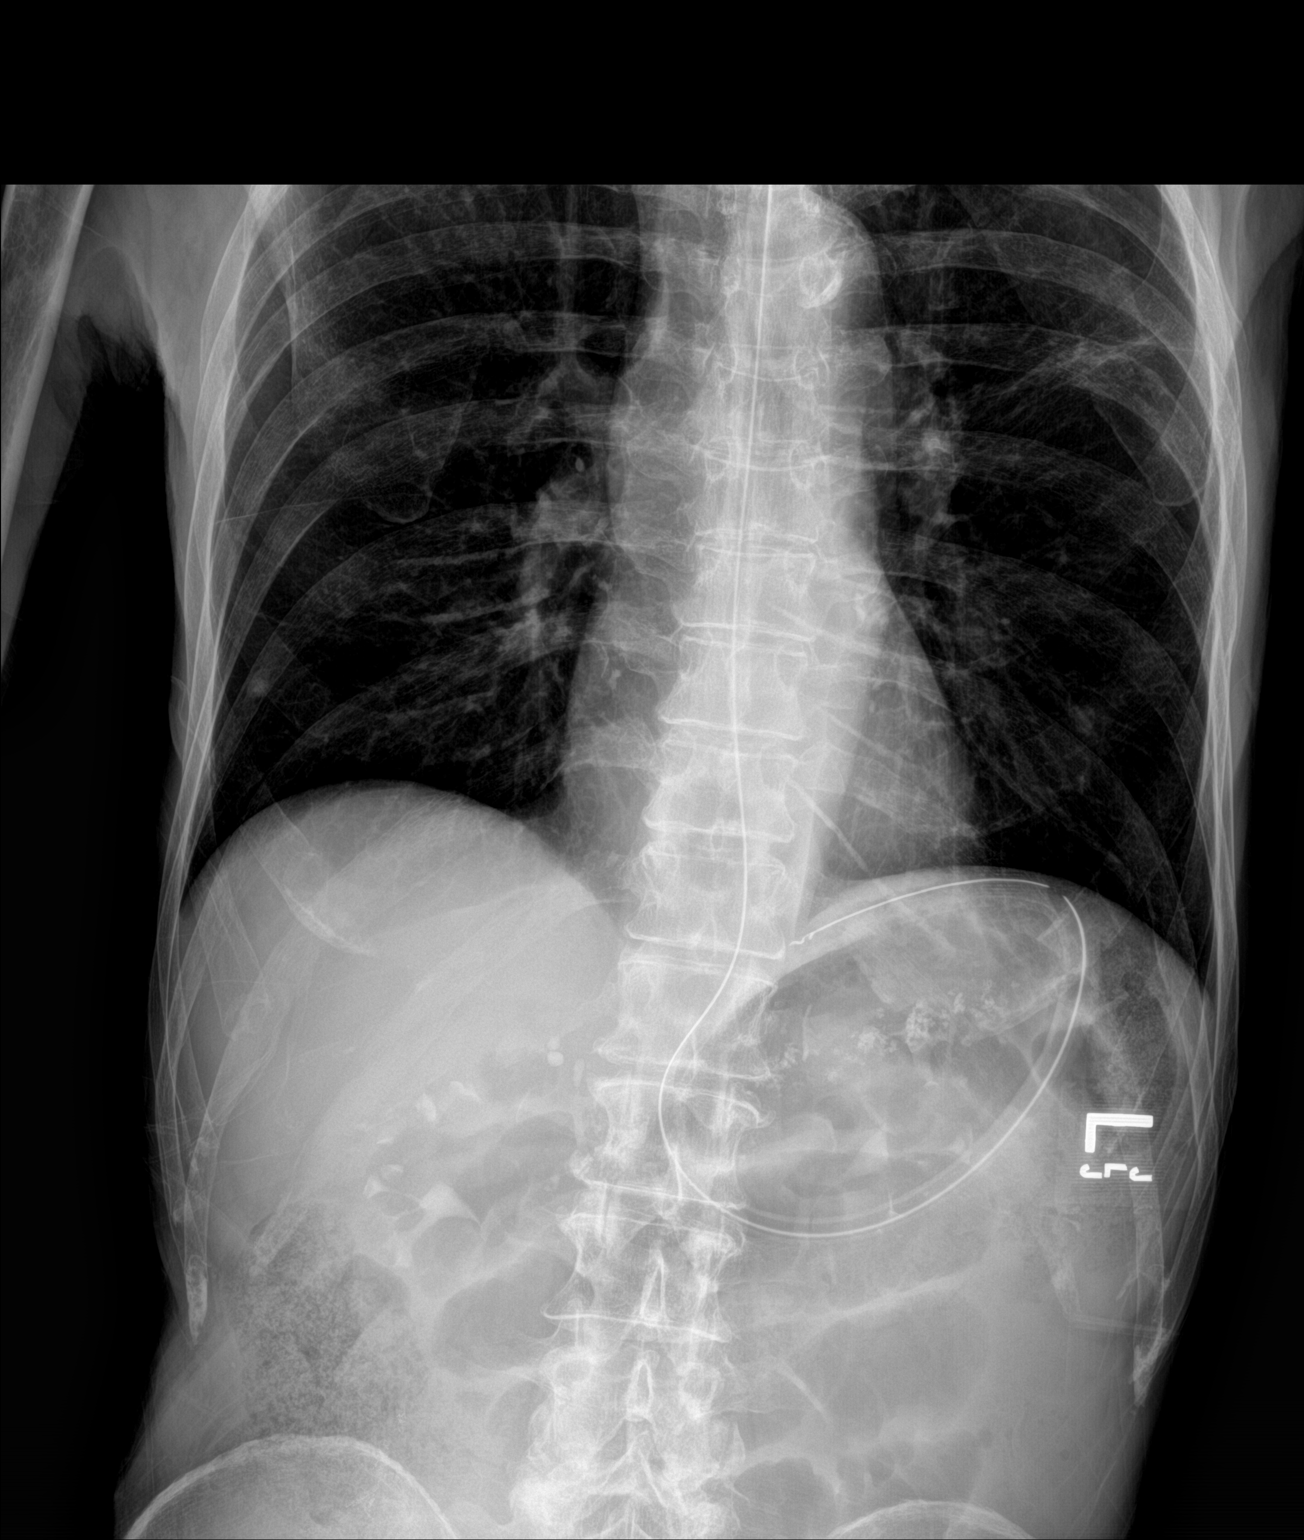

[1 of 1 positions shown; findings below may reference images not displayed]

FINDINGS: Tip and side port of the enteric tube below the diaphragm in the
stomach. Dilated small bowel in the upper abdomen. Excreted IV
contrast within the right renal collecting system from prior CT.
Pancreatic calcifications in the upper abdomen.
IMPRESSION: Tip and side port of the enteric tube below the diaphragm in the
stomach.

## 2020-06-11 DIAGNOSIS — J9601 Acute respiratory failure with hypoxia: Secondary | ICD-10-CM | POA: Diagnosis not present

## 2020-06-11 DIAGNOSIS — G9341 Metabolic encephalopathy: Secondary | ICD-10-CM | POA: Diagnosis not present

## 2020-06-11 DIAGNOSIS — J449 Chronic obstructive pulmonary disease, unspecified: Secondary | ICD-10-CM | POA: Diagnosis not present

## 2020-06-11 DIAGNOSIS — K4030 Unilateral inguinal hernia, with obstruction, without gangrene, not specified as recurrent: Secondary | ICD-10-CM | POA: Diagnosis not present

## 2020-06-26 DIAGNOSIS — Z85118 Personal history of other malignant neoplasm of bronchus and lung: Secondary | ICD-10-CM | POA: Diagnosis not present

## 2020-06-26 DIAGNOSIS — I10 Essential (primary) hypertension: Secondary | ICD-10-CM | POA: Diagnosis not present

## 2020-06-26 DIAGNOSIS — E871 Hypo-osmolality and hyponatremia: Secondary | ICD-10-CM | POA: Diagnosis not present

## 2020-06-26 DIAGNOSIS — E039 Hypothyroidism, unspecified: Secondary | ICD-10-CM | POA: Diagnosis not present

## 2020-06-26 DIAGNOSIS — F5101 Primary insomnia: Secondary | ICD-10-CM | POA: Diagnosis not present

## 2020-07-04 ENCOUNTER — Other Ambulatory Visit: Payer: Self-pay

## 2020-07-04 ENCOUNTER — Telehealth: Payer: Self-pay

## 2020-07-04 ENCOUNTER — Encounter: Payer: Self-pay | Admitting: Urology

## 2020-07-04 ENCOUNTER — Telehealth: Payer: Self-pay | Admitting: *Deleted

## 2020-07-04 NOTE — Telephone Encounter (Signed)
Spoke with patient in regards to telephone appointment with Freeman Caldron PA on 07/11/20 @ 10:30am. Patient verbalized understanding of appointment date and time. Reviewed meaningful use questions.   Patient stated that CT scan needs to be rescheduled to a different day. I will advise Hollie Salk. To reschedule both appointments. Telephone visit and CT scan. TM

## 2020-07-04 NOTE — Telephone Encounter (Signed)
RETURNED PATIENT'S PHONE CALL, RESCHEDULED SCAN AND FU PER PATIENT REQUEST, LAB AND SCAN FOR 07-16-20, AND TELEPHONE FU FOR 07-19-20, PATIENT AGREED TO DATES AND TIMES

## 2020-07-06 ENCOUNTER — Ambulatory Visit (HOSPITAL_COMMUNITY): Payer: Medicare Other

## 2020-07-06 ENCOUNTER — Inpatient Hospital Stay: Payer: Medicare Other

## 2020-07-11 ENCOUNTER — Inpatient Hospital Stay
Admission: RE | Admit: 2020-07-11 | Discharge: 2020-07-11 | Disposition: A | Discharge status: Home / Self Care | Payer: Self-pay | Source: Ambulatory Visit | Attending: Urology | Admitting: Urology

## 2020-07-11 DIAGNOSIS — C3412 Malignant neoplasm of upper lobe, left bronchus or lung: Secondary | ICD-10-CM

## 2020-07-16 ENCOUNTER — Ambulatory Visit
Admission: RE | Admit: 2020-07-16 | Discharge: 2020-07-16 | Disposition: A | Discharge status: Home / Self Care | Payer: Medicare Other | Source: Ambulatory Visit | Attending: Urology | Admitting: Urology

## 2020-07-16 ENCOUNTER — Ambulatory Visit (HOSPITAL_COMMUNITY)
Admission: RE | Admit: 2020-07-16 | Discharge: 2020-07-16 | Disposition: A | Discharge status: Home / Self Care | Payer: Medicare Other | Source: Ambulatory Visit | Attending: Urology | Admitting: Urology

## 2020-07-16 ENCOUNTER — Other Ambulatory Visit: Payer: Self-pay

## 2020-07-16 DIAGNOSIS — C3412 Malignant neoplasm of upper lobe, left bronchus or lung: Secondary | ICD-10-CM

## 2020-07-16 DIAGNOSIS — C3411 Malignant neoplasm of upper lobe, right bronchus or lung: Secondary | ICD-10-CM | POA: Diagnosis not present

## 2020-07-16 DIAGNOSIS — J432 Centrilobular emphysema: Secondary | ICD-10-CM | POA: Diagnosis not present

## 2020-07-16 DIAGNOSIS — J984 Other disorders of lung: Secondary | ICD-10-CM | POA: Diagnosis not present

## 2020-07-16 LAB — BUN & CREATININE (CHCC)
BUN: 16 mg/dL (ref 8–23)
Creatinine: 0.88 mg/dL (ref 0.61–1.24)
GFR, Estimated: >60 mL/min (ref >60)

## 2020-07-16 MED ORDER — IOHEXOL 300 MG/ML  SOLN
75.0000 mL | Freq: Once | INTRAMUSCULAR | Status: AC | PRN
  Administered 2020-07-16: 75 mL via INTRAVENOUS

## 2020-07-19 ENCOUNTER — Other Ambulatory Visit: Payer: Self-pay

## 2020-07-19 ENCOUNTER — Ambulatory Visit
Admission: RE | Admit: 2020-07-19 | Discharge: 2020-07-19 | Disposition: A | Discharge status: Home / Self Care | Payer: Medicare Other | Source: Ambulatory Visit | Attending: Urology | Admitting: Urology

## 2020-07-19 DIAGNOSIS — C3411 Malignant neoplasm of upper lobe, right bronchus or lung: Secondary | ICD-10-CM

## 2020-07-19 DIAGNOSIS — C3412 Malignant neoplasm of upper lobe, left bronchus or lung: Secondary | ICD-10-CM

## 2020-07-19 DIAGNOSIS — Z08 Encounter for follow-up examination after completed treatment for malignant neoplasm: Secondary | ICD-10-CM | POA: Diagnosis not present

## 2020-07-19 NOTE — Progress Notes (Signed)
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  Interval Since Last Radiation:  2.5 years  12/07/2017 - 12/11/2017// Definitive SBRT:  The RUL target was treated to 54 Gy in 3 fractions of 18 Gy  02/01/2015- 02/08/2015// Definitive SBRT: The LUL target(s) was treated to 54 Gy in 3 fractions of 18 Gy  Narrative:  I spoke with the patient to conduct his routine scheduled 6 month follow up visit via telephone to spare the patient unnecessary potential exposure in the healthcare setting during the current COVID-19 pandemic.  The patient was notified in advance and gave permission to proceed with this visit format.  His follow up CT chest scans since the time of his last treatment have continued to show overall disease stability without any overtly pathologic appearing adenopathy and no new lung lesions.  His follow up Chest CT scan from 08/31/19 continued to show stability of his previously treated disease with radiaton changes noted in the RUL, LUL and superior segment of the LLL.  A 6 mm right middle lobe nodule that was noted slightly more conspicuous on his previous scan in 02/2019 appeared stable, however, a mixed attenuation nodule in the left lower lobe, seen previously, had become more confluent in the interval although size was stable at about 9 mm. The recommendation was to follow this closely with a repeat Chest CT which was performed on 01/03/20 and showed the left lower lobe nodule that we were monitoring measured  similar to slightly smaller. There were scattered bilateral interstitial opacities, including new areas within the lingula and left lower lobe that the radiologist felt were most likely due to subacute on chronic infection and the patient confirmed that he had recently gotten over an URI that was treated by his PCP.  There was no thoracic adenopathy and only a small left mediastinal node that has increased in size minimally, felt most likely reactive.   His most recent follow up CT Chest from 07/16/20 continues to show disease stability with a stable 10 mm left lower lobe pulmonary nodule and no evidence of lymphadenopathy or other findings to suggest recurrent or metastatic disease.  We reviewed these results today by phone.                        On review of systems, the patient states that he is feeling well in general and has had no recent changes in his breathing.  He has recently recovered from a gastroenteritis that was treated by his PCP and has not quite regained his full appetite but is trying to maintain/gain weight.  During his illness, he lost approximately 27 pounds and was able to regain some of that but is not back to his normal weight.  He denies any increased shortness of breath, productive cough, hemoptysis, fever, chills or chest pain.  He denies any recent change in his energy level.  ALLERGIES:  is allergic to chantix [varenicline], lansoprazole, and ciprofloxacin.  Meds: Current Outpatient Medications  Medication Sig Dispense Refill  . acetaminophen (  TYLENOL) 325 MG tablet Take 2 tablets (650 mg total) by mouth every 6 (six) hours as needed.    Marland Kitchen albuterol (PROVENTIL) (2.5 MG/3ML) 0.083% nebulizer solution Take 3 mLs (2.5 mg total) by nebulization every 2 (two) hours as needed for wheezing or shortness of breath. 75 mL 1  . albuterol (PROVENTIL) (2.5 MG/3ML) 0.083% nebulizer solution Inhale into the lungs.    Marland Kitchen amLODipine (NORVASC) 10 MG tablet Take 10 mg by mouth every morning.      . budesonide (PULMICORT) 0.5 MG/2ML nebulizer solution Take 2 mLs (0.5 mg total) by nebulization 2 (two) times daily. 56 mL 1  . buPROPion (WELLBUTRIN XL) 150 MG 24 hr tablet Take 150 mg by mouth every morning.     . fluticasone (FLONASE) 50 MCG/ACT nasal spray     . ipratropium (ATROVENT) 0.02 % nebulizer solution     . ipratropium-albuterol (DUONEB) 0.5-2.5 (3) MG/3ML SOLN Take 3 mLs by nebulization 3 (three) times daily. 360 mL 0  . metoprolol succinate (TOPROL-XL) 50 MG 24 hr tablet Take 1 tablet (50 mg total) by mouth every morning. (Patient taking differently: Take 25 mg by mouth every morning.) 30 tablet 3  . Multiple Vitamin (MULTIVITAMIN WITH MINERALS) TABS tablet Take 1 tablet by mouth every morning.    . Nutritional Supplements (CARNATION BREAKFAST ESSENTIALS PO) Take 1 Container by mouth 2 (two) times daily.      No current facility-administered medications for this encounter.    Physical Findings:  vitals were not taken for this visit.   /Unable to assess due to telephone follow up visit format.  Lab Findings: Lab Results  Component Value Date   WBC 4.7 07/07/2018   HGB 9.6 (L) 07/07/2018   HCT 29.8 (L) 07/07/2018   MCV 98.0 07/07/2018   PLT 187 07/07/2018     Radiographic Findings: CT Chest W Contrast  Result Date: 07/17/2020 CLINICAL DATA:  Follow-up bilateral upper lobe non-small cell lung carcinomas. Previous SBRT. Follow-up left lower lobe pulmonary nodule. EXAM: CT CHEST WITH CONTRAST TECHNIQUE: Multidetector CT imaging of the chest was performed during intravenous contrast administration. CONTRAST:  14mL OMNIPAQUE IOHEXOL 300 MG/ML  SOLN COMPARISON:  01/03/2020 FINDINGS: Cardiovascular: No acute findings. Aortic and coronary atherosclerotic calcification noted. Mediastinum/Nodes: No masses or pathologically enlarged lymph nodes identified. Lungs/Pleura: Centrilobular emphysema again noted. Biapical pleural-parenchymal scarring is noted, as well as areas of  peripheral pleural-parenchymal scarring in both lungs, greatest in the upper lobes. 10 x 9 mm solid nodule with lobulated margins in the anterior left lower lobe on image 103/5 remains stable. A few tiny centrally calcified granulomas are again seen bilaterally. No new or enlarging pulmonary nodules or masses identified. No evidence of acute infiltrate or pleural effusion. Upper Abdomen: Diffuse pancreatic calcifications again seen, consistent chronic pancreatitis. Musculoskeletal:  No suspicious bone lesions. IMPRESSION: Stable 10 mm left lower lobe pulmonary nodule. Consider further evaluation with PET-CT, or continued chest CT follow-up. No evidence of lymphadenopathy or pleural effusion. Aortic Atherosclerosis (ICD10-I70.0) and Emphysema (ICD10-J43.9). Electronically Signed   By: Marlaine Hind M.D.   On: 07/17/2020 08:55    Impression/Plan: 25. 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   He continues to remain stable both clinically and radiographically- currently without complaints.  We reviewed the results from his most recent CT Chest from 07/16/20 which shows the left lower lobe nodule that we have continued monitoring remains stable. There is no thoracic adenopathy or other findings suggestive of recurrent or  metastatic disease.  Therefore, we will continue with serial CT chest scans every 6 months for continued surveillance and a telephone follow-up visit thereafter to review the results and recommendations.  He is agreement with this plan and knows to call at anytime in the interim with any questions or concerns.    Given current concerns for patient exposure during the COVID-19 pandemic, this encounter was conducted via telephone. The patient was notified in advance and was offered a WebEX/MyChart video enabled meeting to allow for face to face communication but unfortunately reported that he did not have the appropriate  resources/technology to support such a visit and instead preferred to proceed with telephone visit. The patient has given verbal consent for this type of encounter. The time spent during this encounter was 20 minutes. The attendants for this meeting include Nohelia Valenza PA-C, and patient, Antar Milks .Marland Kitchen During the encounter, Heath Badon PA-C, was located at [facility] Radiation Oncology Department.  Patient, Blong Busk  was located at home.    Nicholos Johns, PA-C

## 2020-07-19 NOTE — Progress Notes (Signed)
Patient states that he has some sob with activity. States that his appetite is good.Reports that he is coughing up clear phlegm. Reports mild fatigue. States that he is having some difficulty with swallowing.Meaningul use was done on this patient.

## 2020-07-23 DIAGNOSIS — M9902 Segmental and somatic dysfunction of thoracic region: Secondary | ICD-10-CM | POA: Diagnosis not present

## 2020-07-23 DIAGNOSIS — M546 Pain in thoracic spine: Secondary | ICD-10-CM | POA: Diagnosis not present

## 2020-07-23 DIAGNOSIS — M9901 Segmental and somatic dysfunction of cervical region: Secondary | ICD-10-CM | POA: Diagnosis not present

## 2020-07-23 DIAGNOSIS — M62830 Muscle spasm of back: Secondary | ICD-10-CM | POA: Diagnosis not present

## 2020-07-23 DIAGNOSIS — M62838 Other muscle spasm: Secondary | ICD-10-CM | POA: Diagnosis not present

## 2020-07-23 DIAGNOSIS — M542 Cervicalgia: Secondary | ICD-10-CM | POA: Diagnosis not present

## 2020-07-23 DIAGNOSIS — M9903 Segmental and somatic dysfunction of lumbar region: Secondary | ICD-10-CM | POA: Diagnosis not present

## 2020-07-23 DIAGNOSIS — M545 Low back pain, unspecified: Secondary | ICD-10-CM | POA: Diagnosis not present

## 2020-07-25 DIAGNOSIS — M9903 Segmental and somatic dysfunction of lumbar region: Secondary | ICD-10-CM | POA: Diagnosis not present

## 2020-07-25 DIAGNOSIS — M62830 Muscle spasm of back: Secondary | ICD-10-CM | POA: Diagnosis not present

## 2020-07-25 DIAGNOSIS — M542 Cervicalgia: Secondary | ICD-10-CM | POA: Diagnosis not present

## 2020-07-25 DIAGNOSIS — M545 Low back pain, unspecified: Secondary | ICD-10-CM | POA: Diagnosis not present

## 2020-07-25 DIAGNOSIS — M9901 Segmental and somatic dysfunction of cervical region: Secondary | ICD-10-CM | POA: Diagnosis not present

## 2020-07-25 DIAGNOSIS — M546 Pain in thoracic spine: Secondary | ICD-10-CM | POA: Diagnosis not present

## 2020-07-25 DIAGNOSIS — M62838 Other muscle spasm: Secondary | ICD-10-CM | POA: Diagnosis not present

## 2020-07-25 DIAGNOSIS — M9902 Segmental and somatic dysfunction of thoracic region: Secondary | ICD-10-CM | POA: Diagnosis not present

## 2020-08-01 DIAGNOSIS — M9902 Segmental and somatic dysfunction of thoracic region: Secondary | ICD-10-CM | POA: Diagnosis not present

## 2020-08-01 DIAGNOSIS — M546 Pain in thoracic spine: Secondary | ICD-10-CM | POA: Diagnosis not present

## 2020-08-01 DIAGNOSIS — M62838 Other muscle spasm: Secondary | ICD-10-CM | POA: Diagnosis not present

## 2020-08-01 DIAGNOSIS — M9901 Segmental and somatic dysfunction of cervical region: Secondary | ICD-10-CM | POA: Diagnosis not present

## 2020-08-01 DIAGNOSIS — M545 Low back pain, unspecified: Secondary | ICD-10-CM | POA: Diagnosis not present

## 2020-08-01 DIAGNOSIS — M62830 Muscle spasm of back: Secondary | ICD-10-CM | POA: Diagnosis not present

## 2020-08-01 DIAGNOSIS — M542 Cervicalgia: Secondary | ICD-10-CM | POA: Diagnosis not present

## 2020-08-01 DIAGNOSIS — M9903 Segmental and somatic dysfunction of lumbar region: Secondary | ICD-10-CM | POA: Diagnosis not present

## 2020-08-06 DIAGNOSIS — M9902 Segmental and somatic dysfunction of thoracic region: Secondary | ICD-10-CM | POA: Diagnosis not present

## 2020-08-06 DIAGNOSIS — M542 Cervicalgia: Secondary | ICD-10-CM | POA: Diagnosis not present

## 2020-08-06 DIAGNOSIS — M545 Low back pain, unspecified: Secondary | ICD-10-CM | POA: Diagnosis not present

## 2020-08-06 DIAGNOSIS — M62830 Muscle spasm of back: Secondary | ICD-10-CM | POA: Diagnosis not present

## 2020-08-06 DIAGNOSIS — M9901 Segmental and somatic dysfunction of cervical region: Secondary | ICD-10-CM | POA: Diagnosis not present

## 2020-08-06 DIAGNOSIS — M62838 Other muscle spasm: Secondary | ICD-10-CM | POA: Diagnosis not present

## 2020-08-06 DIAGNOSIS — M546 Pain in thoracic spine: Secondary | ICD-10-CM | POA: Diagnosis not present

## 2020-08-06 DIAGNOSIS — M9903 Segmental and somatic dysfunction of lumbar region: Secondary | ICD-10-CM | POA: Diagnosis not present

## 2020-08-08 DIAGNOSIS — M62830 Muscle spasm of back: Secondary | ICD-10-CM | POA: Diagnosis not present

## 2020-08-08 DIAGNOSIS — M9903 Segmental and somatic dysfunction of lumbar region: Secondary | ICD-10-CM | POA: Diagnosis not present

## 2020-08-08 DIAGNOSIS — M545 Low back pain, unspecified: Secondary | ICD-10-CM | POA: Diagnosis not present

## 2020-08-08 DIAGNOSIS — M62838 Other muscle spasm: Secondary | ICD-10-CM | POA: Diagnosis not present

## 2020-08-08 DIAGNOSIS — M9901 Segmental and somatic dysfunction of cervical region: Secondary | ICD-10-CM | POA: Diagnosis not present

## 2020-08-08 DIAGNOSIS — M546 Pain in thoracic spine: Secondary | ICD-10-CM | POA: Diagnosis not present

## 2020-08-08 DIAGNOSIS — M542 Cervicalgia: Secondary | ICD-10-CM | POA: Diagnosis not present

## 2020-08-08 DIAGNOSIS — M9902 Segmental and somatic dysfunction of thoracic region: Secondary | ICD-10-CM | POA: Diagnosis not present

## 2020-08-14 DIAGNOSIS — M9901 Segmental and somatic dysfunction of cervical region: Secondary | ICD-10-CM | POA: Diagnosis not present

## 2020-08-14 DIAGNOSIS — M9902 Segmental and somatic dysfunction of thoracic region: Secondary | ICD-10-CM | POA: Diagnosis not present

## 2020-08-14 DIAGNOSIS — M545 Low back pain, unspecified: Secondary | ICD-10-CM | POA: Diagnosis not present

## 2020-08-14 DIAGNOSIS — M62830 Muscle spasm of back: Secondary | ICD-10-CM | POA: Diagnosis not present

## 2020-08-14 DIAGNOSIS — M9903 Segmental and somatic dysfunction of lumbar region: Secondary | ICD-10-CM | POA: Diagnosis not present

## 2020-08-14 DIAGNOSIS — M542 Cervicalgia: Secondary | ICD-10-CM | POA: Diagnosis not present

## 2020-08-14 DIAGNOSIS — M62838 Other muscle spasm: Secondary | ICD-10-CM | POA: Diagnosis not present

## 2020-08-14 DIAGNOSIS — M546 Pain in thoracic spine: Secondary | ICD-10-CM | POA: Diagnosis not present

## 2020-08-15 DIAGNOSIS — M9901 Segmental and somatic dysfunction of cervical region: Secondary | ICD-10-CM | POA: Diagnosis not present

## 2020-08-15 DIAGNOSIS — M545 Low back pain, unspecified: Secondary | ICD-10-CM | POA: Diagnosis not present

## 2020-08-15 DIAGNOSIS — M62838 Other muscle spasm: Secondary | ICD-10-CM | POA: Diagnosis not present

## 2020-08-15 DIAGNOSIS — M9903 Segmental and somatic dysfunction of lumbar region: Secondary | ICD-10-CM | POA: Diagnosis not present

## 2020-08-15 DIAGNOSIS — M62830 Muscle spasm of back: Secondary | ICD-10-CM | POA: Diagnosis not present

## 2020-08-15 DIAGNOSIS — M546 Pain in thoracic spine: Secondary | ICD-10-CM | POA: Diagnosis not present

## 2020-08-15 DIAGNOSIS — M542 Cervicalgia: Secondary | ICD-10-CM | POA: Diagnosis not present

## 2020-08-15 DIAGNOSIS — M9902 Segmental and somatic dysfunction of thoracic region: Secondary | ICD-10-CM | POA: Diagnosis not present

## 2020-08-20 DIAGNOSIS — M62830 Muscle spasm of back: Secondary | ICD-10-CM | POA: Diagnosis not present

## 2020-08-20 DIAGNOSIS — M542 Cervicalgia: Secondary | ICD-10-CM | POA: Diagnosis not present

## 2020-08-20 DIAGNOSIS — M9901 Segmental and somatic dysfunction of cervical region: Secondary | ICD-10-CM | POA: Diagnosis not present

## 2020-08-20 DIAGNOSIS — M545 Low back pain, unspecified: Secondary | ICD-10-CM | POA: Diagnosis not present

## 2020-08-20 DIAGNOSIS — M9903 Segmental and somatic dysfunction of lumbar region: Secondary | ICD-10-CM | POA: Diagnosis not present

## 2020-08-20 DIAGNOSIS — M9902 Segmental and somatic dysfunction of thoracic region: Secondary | ICD-10-CM | POA: Diagnosis not present

## 2020-08-20 DIAGNOSIS — M62838 Other muscle spasm: Secondary | ICD-10-CM | POA: Diagnosis not present

## 2020-08-20 DIAGNOSIS — M546 Pain in thoracic spine: Secondary | ICD-10-CM | POA: Diagnosis not present

## 2020-08-22 DIAGNOSIS — M9902 Segmental and somatic dysfunction of thoracic region: Secondary | ICD-10-CM | POA: Diagnosis not present

## 2020-08-22 DIAGNOSIS — M545 Low back pain, unspecified: Secondary | ICD-10-CM | POA: Diagnosis not present

## 2020-08-22 DIAGNOSIS — M62830 Muscle spasm of back: Secondary | ICD-10-CM | POA: Diagnosis not present

## 2020-08-22 DIAGNOSIS — M9901 Segmental and somatic dysfunction of cervical region: Secondary | ICD-10-CM | POA: Diagnosis not present

## 2020-08-22 DIAGNOSIS — M542 Cervicalgia: Secondary | ICD-10-CM | POA: Diagnosis not present

## 2020-08-22 DIAGNOSIS — M9903 Segmental and somatic dysfunction of lumbar region: Secondary | ICD-10-CM | POA: Diagnosis not present

## 2020-08-22 DIAGNOSIS — M62838 Other muscle spasm: Secondary | ICD-10-CM | POA: Diagnosis not present

## 2020-08-22 DIAGNOSIS — M546 Pain in thoracic spine: Secondary | ICD-10-CM | POA: Diagnosis not present

## 2020-08-27 DIAGNOSIS — M62830 Muscle spasm of back: Secondary | ICD-10-CM | POA: Diagnosis not present

## 2020-08-27 DIAGNOSIS — M62838 Other muscle spasm: Secondary | ICD-10-CM | POA: Diagnosis not present

## 2020-08-27 DIAGNOSIS — M9902 Segmental and somatic dysfunction of thoracic region: Secondary | ICD-10-CM | POA: Diagnosis not present

## 2020-08-27 DIAGNOSIS — M546 Pain in thoracic spine: Secondary | ICD-10-CM | POA: Diagnosis not present

## 2020-08-27 DIAGNOSIS — M542 Cervicalgia: Secondary | ICD-10-CM | POA: Diagnosis not present

## 2020-08-27 DIAGNOSIS — M9901 Segmental and somatic dysfunction of cervical region: Secondary | ICD-10-CM | POA: Diagnosis not present

## 2020-08-27 DIAGNOSIS — M545 Low back pain, unspecified: Secondary | ICD-10-CM | POA: Diagnosis not present

## 2020-08-27 DIAGNOSIS — M9903 Segmental and somatic dysfunction of lumbar region: Secondary | ICD-10-CM | POA: Diagnosis not present

## 2020-08-29 DIAGNOSIS — M545 Low back pain, unspecified: Secondary | ICD-10-CM | POA: Diagnosis not present

## 2020-08-29 DIAGNOSIS — M9902 Segmental and somatic dysfunction of thoracic region: Secondary | ICD-10-CM | POA: Diagnosis not present

## 2020-08-29 DIAGNOSIS — M62830 Muscle spasm of back: Secondary | ICD-10-CM | POA: Diagnosis not present

## 2020-08-29 DIAGNOSIS — M9901 Segmental and somatic dysfunction of cervical region: Secondary | ICD-10-CM | POA: Diagnosis not present

## 2020-08-29 DIAGNOSIS — M9903 Segmental and somatic dysfunction of lumbar region: Secondary | ICD-10-CM | POA: Diagnosis not present

## 2020-08-29 DIAGNOSIS — M62838 Other muscle spasm: Secondary | ICD-10-CM | POA: Diagnosis not present

## 2020-08-29 DIAGNOSIS — M542 Cervicalgia: Secondary | ICD-10-CM | POA: Diagnosis not present

## 2020-08-29 DIAGNOSIS — M546 Pain in thoracic spine: Secondary | ICD-10-CM | POA: Diagnosis not present

## 2020-09-03 DIAGNOSIS — M9903 Segmental and somatic dysfunction of lumbar region: Secondary | ICD-10-CM | POA: Diagnosis not present

## 2020-09-03 DIAGNOSIS — M545 Low back pain, unspecified: Secondary | ICD-10-CM | POA: Diagnosis not present

## 2020-09-03 DIAGNOSIS — M9902 Segmental and somatic dysfunction of thoracic region: Secondary | ICD-10-CM | POA: Diagnosis not present

## 2020-09-03 DIAGNOSIS — M62838 Other muscle spasm: Secondary | ICD-10-CM | POA: Diagnosis not present

## 2020-09-03 DIAGNOSIS — M9901 Segmental and somatic dysfunction of cervical region: Secondary | ICD-10-CM | POA: Diagnosis not present

## 2020-09-03 DIAGNOSIS — M542 Cervicalgia: Secondary | ICD-10-CM | POA: Diagnosis not present

## 2020-09-03 DIAGNOSIS — M546 Pain in thoracic spine: Secondary | ICD-10-CM | POA: Diagnosis not present

## 2020-09-03 DIAGNOSIS — M62830 Muscle spasm of back: Secondary | ICD-10-CM | POA: Diagnosis not present

## 2020-09-07 ENCOUNTER — Other Ambulatory Visit: Payer: Self-pay | Admitting: *Deleted

## 2020-09-07 NOTE — Patient Outreach (Signed)
[place] [facility]) Care Management  09/07/2020  Xzavior Reinig Apr 29, 1951 [id number]   Referral Received 3/23 Initial Outreach 4/1  Telephone Screen-Successful-Declined  RN spoke with pt today and introduced [facility] services and the reason for today's call. Pt receptive and telephonic screen completed with no needs presented. Review all medications as supplies with ongoing adherence in managing his ongoing medical issues. Pt with COPD/HTN and verifies both of these conditions are managed very well with no needs or interventions when [facility] services offered.   RN offered to sent out [facility] information packet as pt hesitated but receptive to information mail-out only at this time. Will alert Dr. [name] of pt's disposition with [facility] services and close case.   Raina Mina, RN Care Management Coordinator [place] Office [phone]

## 2021-01-08 ENCOUNTER — Telehealth: Payer: Self-pay | Admitting: *Deleted

## 2021-01-08 NOTE — Telephone Encounter (Signed)
CALLED PATIENT TO INFORM OF STAT LABS ON 01-16-21 @ 2:30 PM @ [place] [facility] TO FOLLOW ON 01-16-21- ARRIVALTIME- 3:15 PM @ [facility] RADIOLOG[facility], PATIENT TO HAVE WATER ONLY - 4 HRS. PRIOR TO TEST, SPOKE WITH PATIENT'S SGO- [name] AND SHE IS AWARE OF THESE APPTS.

## 2021-01-09 ENCOUNTER — Telehealth: Payer: Self-pay | Admitting: *Deleted

## 2021-01-09 NOTE — Telephone Encounter (Signed)
CALLED PATIENT TO INFORM THAT [name] WILL CALL HIM WITH TEST RESULTS ON 01-22-21 @ 1 PM, PATIENT'S SGO [name], VERIFIED UNDERSTANDING THIS

## 2021-01-10 ENCOUNTER — Ambulatory Visit: Payer: 59 | Admitting: Urology

## 2021-01-16 ENCOUNTER — Ambulatory Visit (HOSPITAL_COMMUNITY)
Admission: RE | Admit: 2021-01-16 | Discharge: 2021-01-16 | Disposition: A | Discharge status: Home / Self Care | Payer: Medicare Other | Source: Ambulatory Visit | Attending: Urology | Admitting: Urology

## 2021-01-16 ENCOUNTER — Ambulatory Visit
Admission: RE | Admit: 2021-01-16 | Discharge: 2021-01-16 | Disposition: A | Discharge status: Home / Self Care | Payer: Medicare Other | Source: Ambulatory Visit | Attending: Urology | Admitting: Urology

## 2021-01-16 ENCOUNTER — Other Ambulatory Visit: Payer: Self-pay

## 2021-01-16 DIAGNOSIS — R918 Other nonspecific abnormal finding of lung field: Secondary | ICD-10-CM | POA: Insufficient documentation

## 2021-01-16 DIAGNOSIS — C3411 Malignant neoplasm of upper lobe, right bronchus or lung: Secondary | ICD-10-CM | POA: Insufficient documentation

## 2021-01-16 DIAGNOSIS — C3412 Malignant neoplasm of upper lobe, left bronchus or lung: Secondary | ICD-10-CM | POA: Diagnosis present

## 2021-01-16 LAB — BUN & CREATININE (CHCC)
BUN: 15 mg/dL (ref 8–23)
Creatinine: 0.87 mg/dL (ref 0.61–1.24)
GFR, Estimated: >60 mL/min (ref >60)

## 2021-01-16 MED ORDER — IOHEXOL 350 MG/ML SOLN
65.0000 mL | Freq: Once | INTRAVENOUS | Status: AC | PRN
  Administered 2021-01-16: 65 mL via INTRAVENOUS

## 2021-01-17 ENCOUNTER — Ambulatory Visit: Payer: Self-pay | Admitting: Urology

## 2021-01-21 ENCOUNTER — Encounter: Payer: Self-pay | Admitting: Urology

## 2021-01-21 NOTE — Progress Notes (Signed)
Patient reports pain 6/10 (lumbar to feet) and would like to know if there is possibly something that can be done for it because it is causing him to have poor energy levels. Patient states some mild coughing and shortness of breath (asthma). Patient expressed a moderate appetite, without any painful swallowing or choking and a non-recent weight loss of roughly 10lbs.  Meaningful use questions complete.  Patient notified of his 1:00pm telephone appointment on 01/22/21 and expressed an understanding of it.

## 2021-01-22 ENCOUNTER — Other Ambulatory Visit: Payer: Self-pay | Admitting: Urology

## 2021-01-22 ENCOUNTER — Ambulatory Visit
Admission: RE | Admit: 2021-01-22 | Discharge: 2021-01-22 | Disposition: A | Discharge status: Home / Self Care | Payer: Medicare Other | Source: Ambulatory Visit | Attending: Urology | Admitting: Urology

## 2021-01-22 DIAGNOSIS — C3411 Malignant neoplasm of upper lobe, right bronchus or lung: Secondary | ICD-10-CM

## 2021-01-22 DIAGNOSIS — C3412 Malignant neoplasm of upper lobe, left bronchus or lung: Secondary | ICD-10-CM

## 2021-01-22 DIAGNOSIS — R911 Solitary pulmonary nodule: Secondary | ICD-10-CM

## 2021-01-22 NOTE — Progress Notes (Signed)
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  Interval Since Last Radiation:  3 years  12/07/2017 - 12/11/2017// Definitive SBRT:  The RUL target was treated to 54 Gy in 3 fractions of 18 Gy  02/01/2015- 02/08/2015// Definitive SBRT: The LUL target(s) was treated to 54 Gy in 3 fractions of 18 Gy  Narrative:  I spoke with the patient to conduct his routine scheduled 6 month follow up visit via telephone to spare the patient unnecessary potential exposure in the healthcare setting during the current COVID-19 pandemic.  The patient was notified in advance and gave permission to proceed with this visit format.  His follow up CT chest scans since the time of his last treatment have continued to show overall disease stability without any overtly pathologic appearing adenopathy and no new lung lesions.  His follow up Chest CT scan from 08/31/19 continued to show stability of his previously treated disease with radiaton changes noted in the RUL, LUL and superior segment of the LLL.  A 6 mm right middle lobe nodule that was noted slightly more conspicuous on his previous scan in 02/2019 appeared stable, however, a mixed attenuation nodule in the left lower lobe, seen previously, had become more confluent in the interval although size was stable at about 9 mm. The recommendation was to follow this closely with a repeat Chest CT which was performed on 01/03/20 and showed the left lower lobe nodule that we were monitoring measured similar  to slightly smaller. There were scattered bilateral interstitial opacities, including new areas within the lingula and left lower lobe that the radiologist felt were most likely due to subacute on chronic infection and the patient confirmed that he had recently gotten over an URI that was treated by his PCP.  There was no thoracic adenopathy and only a small left mediastinal node that has increased in size minimally, felt most likely reactive.   His follow up CT Chest from 07/16/20 continued to show disease stability with a stable 10 mm left lower lobe pulmonary nodule and no evidence of lymphadenopathy or other findings to suggest recurrent or metastatic disease. Most recent CT Chest from 01/16/21 shows slight interval increase in size of the macrolobulated left lower lobe pulmonary nodule, now measuring 13 x 10 mm compared to 10 x 9 mm previously and recommendation is to further evaluate the area with PET. There was also a new 15 mm consolidative opacity in the posterior right costophrenic sulcus, likely infectious/inflammatory. Close attention on follow-up recommended. Otherwise, stable 8 mm sub solid nodule posterior right upper lobe with other similar areas of architectural distortion/scarring bilaterally. We discussed these findings and recommendations today.                        On review of systems, the patient states that he is feeling well in general and has had no recent changes in his breathing.  He is dealing with allergies and PND that cause him to cough but denies green/yellow  sputum, increased shortness of breath, productive cough, hemoptysis, fever, chills or chest pain.  He denies any recent change in his energy level and is maintaining his weight.  ALLERGIES:  is allergic to lansoprazole, varenicline, and ciprofloxacin.  Meds: Current Outpatient Medications  Medication Sig Dispense Refill   acetaminophen (TYLENOL) 325 MG tablet Take 2 tablets (650 mg total) by mouth every 6 (six) hours as  needed.     albuterol (PROVENTIL) (2.5 MG/3ML) 0.083% nebulizer solution Take 3 mLs (2.5 mg total) by nebulization every 2 (two) hours as needed for wheezing or shortness of breath. 75 mL 1   albuterol (PROVENTIL) (2.5 MG/3ML) 0.083% nebulizer solution Inhale into the lungs.     amLODipine (NORVASC) 10 MG tablet Take 10 mg by mouth every morning.      buPROPion (WELLBUTRIN XL) 150 MG 24 hr tablet Take 150 mg by mouth every morning.      fluticasone (FLONASE) 50 MCG/ACT nasal spray      ipratropium (ATROVENT) 0.02 % nebulizer solution      ipratropium-albuterol (DUONEB) 0.5-2.5 (3) MG/3ML SOLN Take 3 mLs by nebulization 3 (three) times daily. 360 mL 0   metoprolol succinate (TOPROL-XL) 50 MG 24 hr tablet Take 1 tablet (50 mg total) by mouth every morning. (Patient taking differently: Take 25 mg by mouth every morning.) 30 tablet 3   Multiple Vitamin (MULTIVITAMIN WITH MINERALS) TABS tablet Take 1 tablet by mouth every morning.     Nutritional Supplements (CARNATION BREAKFAST ESSENTIALS PO) Take 1 Container by mouth 2 (two) times daily.      budesonide (PULMICORT) 0.5 MG/2ML nebulizer solution Take 2 mLs (0.5 mg total) by nebulization 2 (two) times daily. (Patient not taking: No sig reported) 56 mL 1   No current facility-administered medications for this encounter.    Physical Findings:  vitals were not taken for this visit.  Pain Assessment Pain Score: 6  (lumbar-feet)/Unable to assess due to telephone follow up visit format.  Lab Findings: Lab Results  Component Value Date   WBC 4.7 07/07/2018   HGB 9.6 (L) 07/07/2018   HCT 29.8 (L) 07/07/2018   MCV 98.0 07/07/2018   PLT 187 07/07/2018     Radiographic Findings: CT Chest W Contrast  Result Date: 01/18/2021 CLINICAL DATA:  Non-small-cell lung cancer.  Restaging. EXAM: CT CHEST WITH CONTRAST TECHNIQUE: Multidetector CT imaging of the chest was performed during intravenous contrast administration. CONTRAST:  57mL OMNIPAQUE IOHEXOL  350 MG/ML SOLN COMPARISON:  To 12/26/2020 FINDINGS: Cardiovascular: The heart size is normal. No substantial pericardial effusion. Coronary artery calcification is evident. Mild atherosclerotic calcification is noted in the wall of the thoracic aorta. Mediastinum/Nodes: Clustered small lymph nodes in the AP window are stable. No mediastinal lymphadenopathy. There is no hilar lymphadenopathy. The esophagus has normal imaging features. There is no axillary lymphadenopathy. Lungs/Pleura: Centrilobular and paraseptal emphysema evident. Biapical pleuroparenchymal scarring evident. Scattered areas of architectural distortion and scarring are noted in both lungs. Stable appearance 8 mm sub solid nodule posterior right upper lobe on 37/5. Bandlike scarring in the peripheral left lung is unchanged. Retro hilar paraspinal left lower lobe scarring similar to prior. Slight interval increase in size of the macrolobulated left lower lobe pulmonary nodule measuring 13 x 10 mm today compared to 10 x 9 mm previously. Bronchial wall thickening with mild cylindrical bronchiectasis towards the bases is similar to prior. 15 mm consolidative opacity in the posterior right costophrenic sulcus (140/5) is new in the interval and may be infectious/inflammatory. Calcified granuloma  again noted right lower lobe. Upper Abdomen: Diffuse calcification noted in the pancreatic parenchyma with marked dilatation of the main pancreatic duct, stable in compatible with sequelae of chronic pancreatitis. Cortical scarring noted both kidneys. Musculoskeletal: No worrisome lytic or sclerotic osseous abnormality. IMPRESSION: 1. Slight interval increase in size of the macrolobulated left lower lobe pulmonary nodule, now measuring 13 x 10 mm compared to 10 x 9 mm previously. Primary bronchogenic neoplasm a distinct concern. PET-CT could be used to further evaluate. 2. New 15 mm consolidative opacity in the posterior right costophrenic sulcus, likely  infectious/inflammatory. Close attention on follow-up recommended. 3. Stable 8 mm sub solid nodule posterior right upper lobe with other similar areas of architectural distortion/scarring bilaterally. 4. Aortic Atherosclerosis (ICD10-I70.0) and Emphysema (ICD10-J43.9). Electronically Signed   By: Misty Stanley M.D.   On: 01/18/2021 07:30     Impression/Plan: 67. 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   He continues to remain stable clinically, currently without complaints.  We reviewed the results from his most recent CT Chest from 01/16/21 which shows slight interval increase in size of the macrolobulated left lower lobe pulmonary nodule, now measuring 13 x 10 mm compared to 10 x 9 mm previously and recommendation is to further evaluate the area with PET. There was also a new 15 mm consolidative opacity in the posterior right costophrenic sulcus, likely infectious/inflammatory. Close attention on follow-up recommended. Otherwise, stable 8 mm sub solid nodule posterior right upper lobe with other similar areas of architectural distortion/scarring bilaterally. Therefore, we will obtain a PET scan for further evaluation and pending this is without evidence of disease recurrence or progression, we will resume serial CT chest scans every 6 months for continued surveillance and a telephone follow-up visit thereafter to review the results and recommendations.  He is agreement with this plan and knows to call at anytime in the interim with any questions or concerns.    Given current concerns for patient exposure during the COVID-19 pandemic, this encounter was conducted via telephone. The patient was notified in advance and was offered a WebEX/MyChart video enabled meeting to allow for face to face communication but unfortunately reported that he did not have the appropriate resources/technology to support such a visit and instead preferred  to proceed with telephone visit. The patient has given verbal consent for this type of encounter. The time spent during this encounter was 20 minutes. The attendants for this meeting include Soffia Doshier PA-C, and patient, Pellegrino Kennard .Marland Kitchen During the encounter, Avalene Sealy PA-C, was located at [facility] Radiation Oncology Department.  Patient, Mahonri Seiden  was located at home.    Nicholos Johns, PA-C

## 2021-02-07 ENCOUNTER — Telehealth: Payer: Self-pay | Admitting: *Deleted

## 2021-02-07 NOTE — Telephone Encounter (Signed)
CALLED PATIENT'S SGO- [name] TO INFORM OF PET SCAN FOR 02-25-21 - ARRIVAL TIME- 10:30 AM @ [facility], PATIENT TO BE NPO- 6 HRS. PRIOR TO TEST, PATIENT TO RECEIVE TELEPHONE CALL ON 03-07-21 @ 11 AM FOR RESULTS, SPOKE WITH [name] AND SHE IS AWARE OF THESE APPTS.

## 2021-02-25 ENCOUNTER — Other Ambulatory Visit: Payer: Self-pay

## 2021-02-25 ENCOUNTER — Encounter (HOSPITAL_COMMUNITY)
Admission: RE | Admit: 2021-02-25 | Discharge: 2021-02-25 | Disposition: A | Discharge status: Home / Self Care | Payer: Medicare Other | Source: Ambulatory Visit | Attending: Urology | Admitting: Urology

## 2021-02-25 DIAGNOSIS — I2510 Atherosclerotic heart disease of native coronary artery without angina pectoris: Secondary | ICD-10-CM | POA: Insufficient documentation

## 2021-02-25 DIAGNOSIS — C3411 Malignant neoplasm of upper lobe, right bronchus or lung: Secondary | ICD-10-CM | POA: Diagnosis present

## 2021-02-25 DIAGNOSIS — K861 Other chronic pancreatitis: Secondary | ICD-10-CM | POA: Diagnosis not present

## 2021-02-25 DIAGNOSIS — C3412 Malignant neoplasm of upper lobe, left bronchus or lung: Secondary | ICD-10-CM | POA: Diagnosis present

## 2021-02-25 DIAGNOSIS — I700 Atherosclerosis of aorta: Secondary | ICD-10-CM | POA: Diagnosis not present

## 2021-02-25 LAB — GLUCOSE, CAPILLARY: Glucose-Capillary: 91 mg/dL (ref 70–99)

## 2021-02-25 MED ORDER — FLUDEOXYGLUCOSE F - 18 (FDG) INJECTION
5.4000 | Freq: Once | INTRAVENOUS | Status: AC | PRN
  Administered 2021-02-25: 5.4 via INTRAVENOUS

## 2021-03-05 ENCOUNTER — Telehealth: Payer: Self-pay

## 2021-03-05 ENCOUNTER — Encounter: Payer: Self-pay | Admitting: Urology

## 2021-03-05 ENCOUNTER — Encounter: Payer: Self-pay | Admitting: Radiation Oncology

## 2021-03-05 NOTE — Telephone Encounter (Signed)
Pt left a message for Dr. Julien Nordmann requesting he be prescribed pain medication. He states he has pain all over and usually rests at 6/10.  I reviewed pts records and found no historical appts with Dr. Julien Nordmann.  I have called the pt back and left a detailed message advising of this. Also, of note, pt has upcoming phone appt with rad-onc.

## 2021-03-05 NOTE — Progress Notes (Addendum)
Radiation Oncology         [phone] ________________________________  Name: [name] MRN: [id number]  Date: 03/05/2021  DOB: May 29, 1951  Chart Note:  I reviewed this patient's most recent findings and wanted to take a minute to document my impression.  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  Interval Since Last Radiation:  3 years  12/07/2017 - 12/11/2017// Definitive SBRT:  The RUL target was treated to 54 Gy in 3 fractions of 18 Gy  02/01/2015- 02/08/2015// Definitive SBRT: The LUL target(s) was treated to 54 Gy in 3 fractions of 18 Gy  NARRATIVE:  His follow-up Chest CTs have demonstrated an enlarging left lower lung nodule and now PET confirms hypermetabolism suspicious for a new primary left lower lung cancer.    Radiographic Findings: NM PET Image Restag (PS) Skull Base To Thigh  Result Date: 02/26/2021 CLINICAL DATA:  Subsequent treatment strategy for non-small cell lung cancer. EXAM: NUCLEAR MEDICINE PET SKULL BASE TO THIGH TECHNIQUE: 5.0 mCi F-18 FDG was injected intravenously. Full-ring PET imaging was performed from the skull base to thigh after the radiotracer. CT data was obtained and used for attenuation correction and anatomic localization. Fasting blood glucose: 91 mg/dl COMPARISON:  Multiple exams, including chest CT 01/16/2021 and prior PET-CT from 10/19/2017 FINDINGS: Mediastinal blood pool activity: SUV max 1.9 Liver activity: SUV max NA NECK: Physiologic muscular activity in the neck appear symmetric. Incidental CT findings: Chronic left ethmoid sinusitis. Right greater than left common carotid atherosclerotic vascular calcification. CHEST: 1.4 by 1.0 cm anterior left lower lobe nodule on image 52 of series 8, maximum SUV 3.4, high suspicion for malignancy. Progressive opacity in the vicinity of the right medial  costophrenic angle density currently measuring about 3.8 by 1.2 cm on image 71 of series 8 (previously about 1.5 by 1.0 cm), maximum SUV 3.4. Morphologically this favors an inflammatory or infectious process but the rapid enlargement in degree of underlying activity makes it difficult to completely exclude the possibility of malignancy. Multifocal regions architectural distortion and localized opacities and volume loss are otherwise similar to prior. The 2.7 by 1.9 cm left upper lobe opacity posteriorly on image 29 of series 8 has a maximum SUV of 1.2, and demonstrates slowly progressive increase in density/solidity over the last year which could be from radiation fibrosis (correlate with any history of radiation therapy in this region) versus a slow growing low-grade neoplastic lesion. Posterior right upper lobe horseshoe shaped opacity on image 29 series 8 has a maximum SUV of 1.1. This likewise demonstrates some slow progression of accentuated density over the last 2 years, and could be postinflammatory or due to low-grade neoplasm. The reticulonodular opacity inferiorly in the right lower lobe on image 36 of series 8 has maximum SUV of 0.5. Other small regions of nodularity or architectural distortion not hypermetabolic. AP window lymph node 0.8 cm in short axis on image 60 series 4, maximum SUV 2.2. Incidental CT findings: Muscular activity in the upper chest. Coronary, aortic arch, and branch vessel atherosclerotic vascular disease. ABDOMEN/PELVIS: No significant abnormal hypermetabolic activity in this region. Incidental CT findings: Chronic calcific pancreatitis. Dense atherosclerosis including the abdominal aorta. SKELETON: No significant abnormal hypermetabolic activity in this region. Incidental CT findings: none IMPRESSION: 1. The 1.4 by 1.0 cm anterior left lower lobe nodule of concern has a maximum SUV of  3.4, high suspicion for malignancy. 2. Increase in size of the irregular opacity along the right  medial costophrenic angle, currently about 3.8 by 1.2 cm, maximum SUV 3.4. Although morphologically this favors an inflammatory or infectious process, the persistence and enlargement makes it difficult to exclude underlying malignancy. 3. Multifocal regions of architectural distortion and opacity similar to recent chest CT. Posteriorly in both upper lobes there are indistinctly marginated regions which have increased in prominence over the last year or 2, this could be from radiation fibrosis of these have been previously treated lesions, otherwise despite the low-grade activity possibility of multifocal adenocarcinoma must be considered. 4. Other imaging findings of potential clinical significance: Chronic left ethmoid sinusitis. Dense atherosclerosis including the abdominal aorta. Aortic Atherosclerosis (ICD10-I70.0). Chronic calcific pancreatitis. Electronically Signed   By: Van Clines M.D.   On: 02/26/2021 06:55    Impression:  In light of this information, we'll add the patient to the thoracic conference list to discuss diagnostic and treatment options  Plan:  At this point, the patient is set up to proceed with [place] discussion.  He would be a good candidate for definitive SBRT pending multidisciplinary review.  ________________________________  Sheral Apley Tammi Klippel, M.D.

## 2021-03-05 NOTE — Progress Notes (Signed)
Patient reports general overall pain 6/10, moderate fatigue, coughing, some occasional shortness of breath, difficulty swallowing pills but food goes down fine, a reduced appetite and weight loss of roughly 6 lbs.  Meaningful use complete.  Patient notified of 11:00am-03/07/21 telephone appointment and understands.

## 2021-03-07 ENCOUNTER — Ambulatory Visit
Admission: RE | Admit: 2021-03-07 | Discharge: 2021-03-07 | Disposition: A | Discharge status: Home / Self Care | Payer: Medicare Other | Source: Ambulatory Visit | Attending: Urology | Admitting: Urology

## 2021-03-07 DIAGNOSIS — C3412 Malignant neoplasm of upper lobe, left bronchus or lung: Secondary | ICD-10-CM

## 2021-03-07 DIAGNOSIS — R911 Solitary pulmonary nodule: Secondary | ICD-10-CM

## 2021-03-07 DIAGNOSIS — C3411 Malignant neoplasm of upper lobe, right bronchus or lung: Secondary | ICD-10-CM

## 2021-03-07 NOTE — Progress Notes (Signed)
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  Interval Since Last Radiation:  3 years  12/07/2017 - 12/11/2017// Definitive SBRT:  The RUL target was treated to 54 Gy in 3 fractions of 18 Gy  02/01/2015- 02/08/2015// Definitive SBRT: The LUL target(s) was treated to 54 Gy in 3 fractions of 18 Gy  Narrative:  I spoke with the patient to conduct his routine scheduled 6 month follow up visit via telephone to spare the patient unnecessary potential exposure in the healthcare setting during the current COVID-19 pandemic.  The patient was notified in advance and gave permission to proceed with this visit format.  His follow up CT chest scans since the time of his last treatment had continued to show overall disease stability without any overtly pathologic appearing adenopathy and no new lung lesions.  His follow up Chest CT scan from 08/31/19 continued to show stability of his previously treated disease with radiaton changes noted in the RUL, LUL and superior segment of the LLL.  A 6 mm right middle lobe nodule that was noted slightly more conspicuous on his previous scan in 02/2019 appeared stable, however, a mixed attenuation nodule in the left lower lobe, seen previously, had become more confluent in the interval although size was stable at about 9 mm. The recommendation was to follow this closely with a repeat Chest CT which was  performed on 01/03/20 and showed the left lower lobe nodule that we were monitoring measured similar to slightly smaller. There were scattered bilateral interstitial opacities, including new areas within the lingula and left lower lobe that the radiologist felt were most likely due to subacute on chronic infection and the patient confirmed that he had recently gotten over an URI that was treated by his PCP.  There was no thoracic adenopathy and only a small left mediastinal node that has increased in size minimally, felt most likely reactive.   His follow up CT Chest from 07/16/20 continued to show disease stability with a stable 10 mm left lower lobe pulmonary nodule and no evidence of lymphadenopathy or other findings to suggest recurrent or metastatic disease. Most recent CT Chest from 01/16/21 showed slight interval increase in size of the macrolobulated left lower lobe pulmonary nodule, now measuring 13 x 10 mm compared to 10 x 9 mm previously and recommendation was for further evaluation of the area with PET. There was also a new 15 mm consolidative opacity in the posterior right costophrenic sulcus, likely infectious/inflammatory. Close attention on follow-up was recommended. Otherwise, stable 8 mm sub solid nodule posterior right upper lobe with other similar areas of architectural distortion/scarring bilaterally.  A PET was performed on 02/25/21 showed hypermetabolism of the 1.4 cm LLL nodule, highly suspicious for malignancy. There was also further increase in size of the irregular opacity along the right medial costophrenic angle, currently about 3.8 by 1.2 cm, as compared to 1.5 cm on recent  CT Chest from 01/16/21. Morphologically, this favors an inflammatory or infectious process, but the persistence and enlargement makes it difficult to exclude underlying malignancy. We have consulted with Dr. Valeta Harms in pulmonology regarding the biopsy for tissue confirmation and after reviewing the recent PET imaging, he  felt bronchoscopic biopsy was feasible and appropriate. He recommends attempting to sample both locations. We discussed these findings and recommendations today.                        On review of systems, the patient states that he is feeling well in general and has had no recent changes in his breathing.  He denies increased shortness of breath, productive cough, hemoptysis, fever, chills or chest pain.  He denies any recent change in his energy level and has actually gained 2-3 pounds over the past 3-4 weeks which he is quite pleased with.  ALLERGIES:  is allergic to lansoprazole, varenicline, and ciprofloxacin.  Meds: Current Outpatient Medications  Medication Sig Dispense Refill   acetaminophen (TYLENOL) 325 MG tablet Take 2 tablets (650 mg total) by mouth every 6 (six) hours as needed.     albuterol (PROVENTIL) (2.5 MG/3ML) 0.083% nebulizer solution Take 3 mLs (2.5 mg total) by nebulization every 2 (two) hours as needed for wheezing or shortness of breath. 75 mL 1   albuterol (PROVENTIL) (2.5 MG/3ML) 0.083% nebulizer solution Inhale into the lungs.     amLODipine (NORVASC) 10 MG tablet Take 10 mg by mouth every morning.      buPROPion (WELLBUTRIN XL) 150 MG 24 hr tablet Take 150 mg by mouth every morning.      fluticasone (FLONASE) 50 MCG/ACT nasal spray      ipratropium (ATROVENT) 0.02 % nebulizer solution      ipratropium-albuterol (DUONEB) 0.5-2.5 (3) MG/3ML SOLN Take 3 mLs by nebulization 3 (three) times daily. 360 mL 0   metoprolol succinate (TOPROL-XL) 50 MG 24 hr tablet Take 1 tablet (50 mg total) by mouth every morning. (Patient taking differently: Take 25 mg by mouth every morning.) 30 tablet 3   Multiple Vitamin (MULTIVITAMIN WITH MINERALS) TABS tablet Take 1 tablet by mouth every morning.     Nutritional Supplements (CARNATION BREAKFAST ESSENTIALS PO) Take 1 Container by mouth 2 (two) times daily.      budesonide (PULMICORT) 0.5 MG/2ML nebulizer solution Take 2 mLs (0.5 mg  total) by nebulization 2 (two) times daily. (Patient not taking: No sig reported) 56 mL 1   No current facility-administered medications for this encounter.    Physical Findings:  vitals were not taken for this visit.  Pain Assessment Pain Score: 6  (general overall)/Unable to assess due to telephone follow up visit format.  Lab Findings: Lab Results  Component Value Date   WBC 4.7 07/07/2018   HGB 9.6 (L) 07/07/2018   HCT 29.8 (L) 07/07/2018   MCV 98.0 07/07/2018   PLT 187 07/07/2018     Radiographic Findings: NM PET Image Restag (PS) Skull Base To Thigh  Result Date: 02/26/2021 CLINICAL DATA:  Subsequent treatment strategy for non-small cell lung cancer. EXAM: NUCLEAR MEDICINE PET SKULL BASE TO THIGH TECHNIQUE: 5.0 mCi F-18 FDG was injected intravenously. Full-ring PET imaging was performed from the skull base to thigh after the radiotracer. CT data was obtained and used for attenuation correction and anatomic localization. Fasting blood glucose: 91 mg/dl COMPARISON:  Multiple exams, including chest CT 01/16/2021 and prior PET-CT from 10/19/2017 FINDINGS: Mediastinal blood pool activity: SUV max 1.9  Liver activity: SUV max NA NECK: Physiologic muscular activity in the neck appear symmetric. Incidental CT findings: Chronic left ethmoid sinusitis. Right greater than left common carotid atherosclerotic vascular calcification. CHEST: 1.4 by 1.0 cm anterior left lower lobe nodule on image 52 of series 8, maximum SUV 3.4, high suspicion for malignancy. Progressive opacity in the vicinity of the right medial costophrenic angle density currently measuring about 3.8 by 1.2 cm on image 71 of series 8 (previously about 1.5 by 1.0 cm), maximum SUV 3.4. Morphologically this favors an inflammatory or infectious process but the rapid enlargement in degree of underlying activity makes it difficult to completely exclude the possibility of malignancy. Multifocal regions architectural distortion and localized  opacities and volume loss are otherwise similar to prior. The 2.7 by 1.9 cm left upper lobe opacity posteriorly on image 29 of series 8 has a maximum SUV of 1.2, and demonstrates slowly progressive increase in density/solidity over the last year which could be from radiation fibrosis (correlate with any history of radiation therapy in this region) versus a slow growing low-grade neoplastic lesion. Posterior right upper lobe horseshoe shaped opacity on image 29 series 8 has a maximum SUV of 1.1. This likewise demonstrates some slow progression of accentuated density over the last 2 years, and could be postinflammatory or due to low-grade neoplasm. The reticulonodular opacity inferiorly in the right lower lobe on image 36 of series 8 has maximum SUV of 0.5. Other small regions of nodularity or architectural distortion not hypermetabolic. AP window lymph node 0.8 cm in short axis on image 60 series 4, maximum SUV 2.2. Incidental CT findings: Muscular activity in the upper chest. Coronary, aortic arch, and branch vessel atherosclerotic vascular disease. ABDOMEN/PELVIS: No significant abnormal hypermetabolic activity in this region. Incidental CT findings: Chronic calcific pancreatitis. Dense atherosclerosis including the abdominal aorta. SKELETON: No significant abnormal hypermetabolic activity in this region. Incidental CT findings: none IMPRESSION: 1. The 1.4 by 1.0 cm anterior left lower lobe nodule of concern has a maximum SUV of 3.4, high suspicion for malignancy. 2. Increase in size of the irregular opacity along the right medial costophrenic angle, currently about 3.8 by 1.2 cm, maximum SUV 3.4. Although morphologically this favors an inflammatory or infectious process, the persistence and enlargement makes it difficult to exclude underlying malignancy. 3. Multifocal regions of architectural distortion and opacity similar to recent chest CT. Posteriorly in both upper lobes there are indistinctly marginated  regions which have increased in prominence over the last year or 2, this could be from radiation fibrosis of these have been previously treated lesions, otherwise despite the low-grade activity possibility of multifocal adenocarcinoma must be considered. 4. Other imaging findings of potential clinical significance: Chronic left ethmoid sinusitis. Dense atherosclerosis including the abdominal aorta. Aortic Atherosclerosis (ICD10-I70.0). Chronic calcific pancreatitis. Electronically Signed   By: Van Clines M.D.   On: 02/26/2021 06:55     Impression/Plan: 1. 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  He continues to remain stable clinically, currently without complaints.  We reviewed the results from his most recent PET which was performed on 02/25/21 showing hypermetabolism of the 1.4 cm LLL nodule, highly suspicious for malignancy. There was also further increase in size of the irregular opacity along the right medial costophrenic angle,  currently about 3.8 by 1.2 cm, as compared to 1.5 cm on recent CT Chest from 01/16/21. Morphologically, this favors an inflammatory or infectious process, but the persistence and enlargement makes it difficult to exclude underlying malignancy.  We discussed the option of either proceeding with bronchoscopic biopsy for tissue confirmation versus proceeding with definitive stereotactic body radiotherapy (SBRT) without tissue.after lengthy discussion regarding these options, the patient elects to proceed with biopsy and pending those results, would be interested in proceeding with SBRT.  We will make a referral to Dr. Valeta Harms and coordinate for tissue biopsy, first available.  I will plan to follow-up with him by phone once those results  are available to further discuss treatment recommendations at that time.  He is comfortable and in agreement with this plan and knows to call at anytime in the interim with any questions or concerns.    Given current concerns for patient exposure during the COVID-19 pandemic, this encounter was conducted via telephone. The patient was notified in advance and was offered a WebEX/MyChart video enabled meeting to allow for face to face communication but unfortunately reported that he did not have the appropriate resources/technology to support such a visit and instead preferred to proceed with telephone visit. The patient has given verbal consent for this type of encounter. The attendants for this meeting include Daziah Hesler PA-C, patient, Stanly Si and his significant other, Denita. During the encounter, Olson Lucarelli PA-C, was located at [facility] Radiation Oncology Department.  Patient, Suraj Ramdass and his significant other, Denita, were located at home.   I personally spent 40 minutes in this encounter including chart review, reviewing radiological studies, telephone discussion with the patient, entering orders, coordinating care with other providers and completing documentation.     Nicholos Johns, PA-C

## 2021-04-01 ENCOUNTER — Ambulatory Visit (INDEPENDENT_AMBULATORY_CARE_PROVIDER_SITE_OTHER): Payer: Medicare Other | Admitting: Pulmonary Disease

## 2021-04-01 ENCOUNTER — Telehealth: Payer: Self-pay | Admitting: Pulmonary Disease

## 2021-04-01 ENCOUNTER — Encounter: Payer: Self-pay | Admitting: Pulmonary Disease

## 2021-04-01 ENCOUNTER — Other Ambulatory Visit: Payer: Self-pay

## 2021-04-01 VITALS — BP 124/64 | HR 65 | Temp 97.6°F | Ht 62.0 in | Wt 80.0 lb

## 2021-04-01 DIAGNOSIS — J432 Centrilobular emphysema: Secondary | ICD-10-CM

## 2021-04-01 DIAGNOSIS — C3491 Malignant neoplasm of unspecified part of right bronchus or lung: Secondary | ICD-10-CM | POA: Diagnosis not present

## 2021-04-01 DIAGNOSIS — F17200 Nicotine dependence, unspecified, uncomplicated: Secondary | ICD-10-CM | POA: Diagnosis not present

## 2021-04-01 DIAGNOSIS — R918 Other nonspecific abnormal finding of lung field: Secondary | ICD-10-CM

## 2021-04-01 DIAGNOSIS — C3492 Malignant neoplasm of unspecified part of left bronchus or lung: Secondary | ICD-10-CM | POA: Diagnosis not present

## 2021-04-01 NOTE — Patient Instructions (Signed)
Thank you for visiting Dr. Valeta Harms at [facility]. Today we recommend the following:  Orders Placed This Encounter  Procedures   Procedural/ Surgical Case Request: VIDEO BRONCHOSCOPY WITH ENDOBRONCHIAL NAVIGATION   CT Super D Chest Wo Contrast   Ambulatory referral to Pulmonology   Bronchoscopy on 04/23/2021 Follow up after bronch on 04/29/2021 with Sarah.   Return in about 4 weeks (around 04/29/2021), or w/ Eric Form, NP.    Please do your part to reduce the spread of COVID-19.

## 2021-04-01 NOTE — H&P (View-Only) (Signed)
Synopsis: Referred in October 2022 for lung nodules by [name], MD  Subjective:   PATIENT ID: [name] GENDER: male DOB: 08-Jun-1951, MRN: [id number]  Chief Complaint  Patient presents with   Consult    Pt. Wants to talk about biopsy    This is a 70 year old gentleman, past medical history of COPD, current smoker, hypertension, tobacco abuse.  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  The first 1 in 2016 and the other in 2019 both treated with SBRT.  He has had subsequent follow-up imaging with radiation oncology.  Recent restaging PET scan with a slowly enlarging lesion within the right lower lobe that they could not rule out inflammatory versus malignancy as well as a more solid peripheral subpleural nodule within the left upper lobe concerning for malignancy.  Patient was referred here for consideration for tissue biopsy of both lesions.  Patient states that he had percutaneous biopsy in the past and suffered a pneumothorax.  This was after biopsy of his right upper lobe apical lesion in 2019.   Past Medical History:  Diagnosis Date   Allergies    being around corn & grain causes itchy eyes, nasal congestion. He can eat these with no problems.   COPD (chronic obstructive pulmonary disease) (HCC)    Hypertension    Iliac artery stenosis, right (HCC)    80% Stenosis   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[place])    Lower limb ischemia 03/26/2018   Lung cancer dx'd 12/2014   Pancreatitis    x2 stents placed to make patent duct to pancreas   Pulmonary hypertension (HCC)    Severe claudication ([place])    Status post left foot surgery    Tobacco abuse      Family History  Problem Relation Age of Onset   COPD Mother    CAD Mother    Diabetes Father    Cancer Brother        metastatic, unknown primary   Cancer Paternal Grandmother      Past Surgical History:  Procedure Laterality Date   ABDOMINAL AORTOGRAM W/LOWER EXTREMITY N/A 03/30/2018   Procedure:  ABDOMINAL AORTOGRAM W/LOWER EXTREMITY;  Surgeon: Nigel Mormon, MD;  Location: [facility];  Service: Cardiovascular;  Laterality: N/A;   COLECTOMY     COLOSTOMY     COLOSTOMY REVERSAL     EUS N/A 12/14/2014   Procedure: UPPER ENDOSCOPIC ULTRASOUND (EUS) LINEAR;  Surgeon: Milus Banister, MD;  Location: [facility];  Service: Endoscopy;  Laterality: N/A;   FOOT SURGERY Left 2006   INGUINAL HERNIA REPAIR Left 06/18/2018   Procedure: OPEN RECURRENT LEFT INGUINAL HERNIA REPAIR WITH MESH;  Surgeon: Armandina Gemma, MD;  Location: [facility];  Service: General;  Laterality: Left;   INSERTION OF MESH Left 06/18/2018   Procedure: INSERTION OF MESH;  Surgeon: Armandina Gemma, MD;  Location: [facility];  Service: General;  Laterality: Left;   LAPAROTOMY N/A 06/30/2018   Procedure: EXPLORATORY LAPAROTOMY LYSISIS OF ADHESIONS;  Surgeon: Jovita Kussmaul, MD;  Location: [facility];  Service: General;  Laterality: N/A;   PANCREAS SURGERY  2007   PERIPHERAL VASCULAR INTERVENTION  03/30/2018   Procedure: PERIPHERAL VASCULAR INTERVENTION;  Surgeon: Nigel Mormon, MD;  Location: [facility];  Service: Cardiovascular;;  Rt Iliac   UPPER GI ENDOSCOPY     VENTRAL HERNIA REPAIR  1967    Social History   Socioeconomic History   Marital status: Significant Other  Spouse name: Not on file   Number of children: 3   Years of education: 23   Highest education level: Not on file  Occupational History   Occupation: Retired  Tobacco Use   Smoking status: Every Day    Packs/day: 1.00    Years: 50.00    Pack years: 50.00    Types: Cigarettes   Smokeless tobacco: Never   Tobacco comments:    smoking 0.5 ppd as of 10/07/2018  Vaping Use   Vaping Use: Former  Substance and Sexual Activity   Alcohol use: Yes    Alcohol/week: 6.0 standard drinks    Types: 6 Cans of beer per week   Drug use: No   Sexual activity: Yes    Birth control/protection: Injection  Other Topics Concern   Not on file  Social  History Narrative   Lives at home w/ a friend   Left-handed   Caffeine: 2 cups of coffee per day   Social Determinants of Health   Financial Resource Strain: Not on file  Food Insecurity: Not on file  Transportation Needs: Not on file  Physical Activity: Not on file  Stress: Not on file  Social Connections: Not on file  Intimate Partner Violence: Not on file     Allergies  Allergen Reactions   Lansoprazole Itching and Other (See Comments)    "swell up with gas" "swell up with gas" "swell up with gas"   Varenicline Other (See Comments)    Physically ill Vivid dreams   Ciprofloxacin Other (See Comments)    GI Upset     Outpatient Medications Prior to Visit  Medication Sig Dispense Refill   acetaminophen (TYLENOL) 325 MG tablet Take 2 tablets (650 mg total) by mouth every 6 (six) hours as needed.     albuterol (PROVENTIL) (2.5 MG/3ML) 0.083% nebulizer solution Take 3 mLs (2.5 mg total) by nebulization every 2 (two) hours as needed for wheezing or shortness of breath. 75 mL 1   albuterol (PROVENTIL) (2.5 MG/3ML) 0.083% nebulizer solution Inhale into the lungs.     amLODipine (NORVASC) 10 MG tablet Take 10 mg by mouth every morning.      buPROPion (WELLBUTRIN XL) 150 MG 24 hr tablet Take 150 mg by mouth every morning.      fluticasone (FLONASE) 50 MCG/ACT nasal spray      ipratropium (ATROVENT) 0.02 % nebulizer solution      ipratropium-albuterol (DUONEB) 0.5-2.5 (3) MG/3ML SOLN Take 3 mLs by nebulization 3 (three) times daily. 360 mL 0   metoprolol succinate (TOPROL-XL) 50 MG 24 hr tablet Take 1 tablet (50 mg total) by mouth every morning. (Patient taking differently: Take 25 mg by mouth every morning.) 30 tablet 3   Multiple Vitamin (MULTIVITAMIN WITH MINERALS) TABS tablet Take 1 tablet by mouth every morning.     Nutritional Supplements (CARNATION BREAKFAST ESSENTIALS PO) Take 1 Container by mouth 2 (two) times daily.      budesonide (PULMICORT) 0.5 MG/2ML nebulizer solution  Take 2 mLs (0.5 mg total) by nebulization 2 (two) times daily. (Patient not taking: No sig reported) 56 mL 1   No facility-administered medications prior to visit.    Review of Systems  Constitutional:  Negative for chills, fever, malaise/fatigue and weight loss.  HENT:  Negative for hearing loss, sore throat and tinnitus.   Eyes:  Negative for blurred vision and double vision.  Respiratory:  Positive for cough and shortness of breath. Negative for hemoptysis, sputum production, wheezing and stridor.  Cardiovascular:  Negative for chest pain, palpitations, orthopnea, leg swelling and PND.  Gastrointestinal:  Negative for abdominal pain, constipation, diarrhea, heartburn, nausea and vomiting.  Genitourinary:  Negative for dysuria, hematuria and urgency.  Musculoskeletal:  Negative for joint pain and myalgias.  Skin:  Negative for itching and rash.  Neurological:  Negative for dizziness, tingling, weakness and headaches.  Endo/Heme/Allergies:  Negative for environmental allergies. Does not bruise/bleed easily.  Psychiatric/Behavioral:  Negative for depression. The patient is not nervous/anxious and does not have insomnia.   All other systems reviewed and are negative.   Objective:  Physical Exam Vitals reviewed.  Constitutional:      General: He is not in acute distress.    Appearance: He is well-developed.  HENT:     Head: Normocephalic and atraumatic.     Mouth/Throat:     Pharynx: No oropharyngeal exudate.  Eyes:     Conjunctiva/sclera: Conjunctivae normal.     Pupils: Pupils are equal, round, and reactive to light.  Neck:     Vascular: No JVD.     Trachea: No tracheal deviation.     Comments: Loss of supraclavicular fat Cardiovascular:     Rate and Rhythm: Normal rate and regular rhythm.     Heart sounds: S1 normal and S2 normal.     Comments: Distant heart tones Pulmonary:     Effort: No tachypnea or accessory muscle usage.     Breath sounds: No stridor. Decreased  breath sounds (throughout all lung fields) present. No wheezing, rhonchi or rales.     Comments: Diminished breath sounds bilaterally Abdominal:     General: Bowel sounds are normal. There is no distension.     Palpations: Abdomen is soft.     Tenderness: There is no abdominal tenderness.  Musculoskeletal:        General: Deformity (muscle wasting ) present.  Skin:    General: Skin is warm and dry.     Capillary Refill: Capillary refill takes less than 2 seconds.     Findings: No rash.  Neurological:     Mental Status: He is alert and oriented to person, place, and time.  Psychiatric:        Behavior: Behavior normal.     Vitals:   04/01/21 1121  BP: 124/64  Pulse: 65  Temp: 97.6 F (36.4 C)  TempSrc: Oral  SpO2: 94%  Weight: 80 lb (36.3 kg)  Height: 5\' 2"  (1.575 m)   94% on RA BMI Readings from Last 3 Encounters:  04/01/21 14.63 kg/m  10/20/18 14.83 kg/m  10/07/18 14.88 kg/m   Wt Readings from Last 3 Encounters:  04/01/21 80 lb (36.3 kg)  10/20/18 83 lb 11.2 oz (38 kg)  10/07/18 84 lb (38.1 kg)     CBC    Component Value Date/Time   WBC 4.7 07/07/2018 0525   RBC 3.04 (L) 07/07/2018 0525   HGB 9.6 (L) 07/07/2018 0525   HGB 14.0 11/20/2014 1352   HCT 29.8 (L) 07/07/2018 0525   HCT 42.2 11/20/2014 1352   PLT 187 07/07/2018 0525   PLT 206 11/20/2014 1352   MCV 98.0 07/07/2018 0525   MCV 93.2 11/20/2014 1352   MCH 31.6 07/07/2018 0525   MCHC 32.2 07/07/2018 0525   RDW 15.2 07/07/2018 0525   RDW 15.2 (H) 11/20/2014 1352   LYMPHSABS 0.5 (L) 07/05/2018 0426   LYMPHSABS 1.2 11/20/2014 1352   MONOABS 0.7 07/05/2018 0426   MONOABS 0.8 11/20/2014 1352   EOSABS 0.0 07/05/2018 0426  EOSABS 0.2 11/20/2014 1352   BASOSABS 0.0 07/05/2018 0426   BASOSABS 0.1 11/20/2014 1352     Chest Imaging: 01/16/2021 CT chest: Left upper lobe subpleural lesion concerning for malignancy, a slowly enlarging opacity within the left upper lobe along the major fissure line as  well as right lower lobe subpleural lesion difficult to exclude malignancy.  This is a gentleman that already had 2 previous lung cancers. The patient's images have been independently reviewed by me.    Pulmonary Functions Testing Results: PFT Results Latest Ref Rng & Units 10/30/2014  FVC-Pre L 2.54  FVC-Predicted Pre % 74  Pre FEV1/FVC % % 49  FEV1-Pre L 1.25  FEV1-Predicted Pre % 49  DLCO uncorrected ml/min/mmHg 7.75  DLCO UNC% % 35  DLVA Predicted % 43  TLC L 6.76  TLC % Predicted % 125  RV % Predicted % 217    FeNO:   Pathology:  2016 non-small cell lung cancer, left 2019: Right upper lobe non-small cell lung cancer  Echocardiogram:   Heart Catheterization:     Assessment & Plan:     ICD-10-CM   1. Lung nodules  R91.8 CT Super D Chest Wo Contrast    Ambulatory referral to Pulmonology    Procedural/ Surgical Case Request: VIDEO BRONCHOSCOPY WITH ENDOBRONCHIAL NAVIGATION    2. NSCLC of right lung ([place])  C34.91     3. NSCLC of left lung ([place])  C34.92     4. Current smoker  F17.200     5. Centrilobular emphysema ([place])  J43.2       Discussion:  This is a 70 year old gentleman, current smoker, history of metachronous malignancies non-small cell lung cancer of the right lung and left lung status post SBRT.  He does have severe emphysema, low BMI, now with progressive lung nodules concerning for recurrence of new disease in his chest.  Plan: I think the most concerning lesion is the left upper lobe anterior subpleural lesion.  We will plan to go after this first. We discussed the risk benefits and alternatives of proceeding with navigational bronchoscopy today in the office to include bleeding and pneumothorax. Patient is agreeable to proceed. We will plan for bronchoscopy on 04/23/2021. Orders have been placed. He also needs a super D CT scan for diagnostic planning.  As for a strategic approach to his lesions I think we will start with the left upper lobe lesion  on the anterior wall. Then we can consider going after the more apical posterior lesion adjacent to the major fissure.  If he tolerates a biopsy and does well on the left side then we can consider going after the right lower lobe lesion  Follow-up appointment to discuss biopsy results to be scheduled with Eric Form, NP on 04/30/2019.    Current Outpatient Medications:    acetaminophen (TYLENOL) 325 MG tablet, Take 2 tablets (650 mg total) by mouth every 6 (six) hours as needed., Disp: , Rfl:    albuterol (PROVENTIL) (2.5 MG/3ML) 0.083% nebulizer solution, Take 3 mLs (2.5 mg total) by nebulization every 2 (two) hours as needed for wheezing or shortness of breath., Disp: 75 mL, Rfl: 1   albuterol (PROVENTIL) (2.5 MG/3ML) 0.083% nebulizer solution, Inhale into the lungs., Disp: , Rfl:    amLODipine (NORVASC) 10 MG tablet, Take 10 mg by mouth every morning. , Disp: , Rfl:    buPROPion (WELLBUTRIN XL) 150 MG 24 hr tablet, Take 150 mg by mouth every morning. , Disp: , Rfl:  fluticasone (FLONASE) 50 MCG/ACT nasal spray, , Disp: , Rfl:    ipratropium (ATROVENT) 0.02 % nebulizer solution, , Disp: , Rfl:    ipratropium-albuterol (DUONEB) 0.5-2.5 (3) MG/3ML SOLN, Take 3 mLs by nebulization 3 (three) times daily., Disp: 360 mL, Rfl: 0   metoprolol succinate (TOPROL-XL) 50 MG 24 hr tablet, Take 1 tablet (50 mg total) by mouth every morning. (Patient taking differently: Take 25 mg by mouth every morning.), Disp: 30 tablet, Rfl: 3   Multiple Vitamin (MULTIVITAMIN WITH MINERALS) TABS tablet, Take 1 tablet by mouth every morning., Disp: , Rfl:    Nutritional Supplements (CARNATION BREAKFAST ESSENTIALS PO), Take 1 Container by mouth 2 (two) times daily. , Disp: , Rfl:    budesonide (PULMICORT) 0.5 MG/2ML nebulizer solution, Take 2 mLs (0.5 mg total) by nebulization 2 (two) times daily. (Patient not taking: No sig reported), Disp: 56 mL, Rfl: 1  I spent 63 minutes dedicated to the care of this patient on the  date of this encounter to include pre-visit review of records, face-to-face time with the patient discussing conditions above, post visit ordering of testing, clinical documentation with the electronic health record, making appropriate referrals as documented, and communicating necessary findings to members of the patients care team.   Garner Nash, DO [facility] 04/01/2021 11:28 AM

## 2021-04-01 NOTE — Progress Notes (Signed)
Synopsis: Referred in October 2022 for lung nodules by [name], MD  Subjective:   PATIENT ID: [name] GENDER: male DOB: [birth year], MRN: [id number]  Chief Complaint  Patient presents with   Consult    Pt. Wants to talk about biopsy    This is a 70 year old gentleman, past medical history of COPD, current smoker, hypertension, tobacco abuse.  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  The first 1 in 2016 and the other in 2019 both treated with SBRT.  He has had subsequent follow-up imaging with radiation oncology.  Recent restaging PET scan with a slowly enlarging lesion within the right lower lobe that they could not rule out inflammatory versus malignancy as well as a more solid peripheral subpleural nodule within the left upper lobe concerning for malignancy.  Patient was referred here for consideration for tissue biopsy of both lesions.  Patient states that he had percutaneous biopsy in the past and suffered a pneumothorax.  This was after biopsy of his right upper lobe apical lesion in 2019.   Past Medical History:  Diagnosis Date   Allergies    being around corn & grain causes itchy eyes, nasal congestion. He can eat these with no problems.   COPD (chronic obstructive pulmonary disease) (HCC)    Hypertension    Iliac artery stenosis, right (HCC)    80% Stenosis   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[place])    Lower limb ischemia 03/26/2018   Lung cancer dx'd 12/2014   Pancreatitis    x2 stents placed to make patent duct to pancreas   Pulmonary hypertension (HCC)    Severe claudication ([place])    Status post left foot surgery    Tobacco abuse      Family History  Problem Relation Age of Onset   COPD Mother    CAD Mother    Diabetes Father    Cancer Brother        metastatic, unknown primary   Cancer Paternal Grandmother      Past Surgical History:  Procedure Laterality Date   ABDOMINAL AORTOGRAM W/LOWER EXTREMITY N/A 03/30/2018   Procedure:  ABDOMINAL AORTOGRAM W/LOWER EXTREMITY;  Surgeon: Nigel Mormon, MD;  Location: [facility];  Service: Cardiovascular;  Laterality: N/A;   COLECTOMY     COLOSTOMY     COLOSTOMY REVERSAL     EUS N/A 12/14/2014   Procedure: UPPER ENDOSCOPIC ULTRASOUND (EUS) LINEAR;  Surgeon: Milus Banister, MD;  Location: [facility];  Service: Endoscopy;  Laterality: N/A;   FOOT SURGERY Left 2006   INGUINAL HERNIA REPAIR Left 06/18/2018   Procedure: OPEN RECURRENT LEFT INGUINAL HERNIA REPAIR WITH MESH;  Surgeon: Armandina Gemma, MD;  Location: [facility];  Service: General;  Laterality: Left;   INSERTION OF MESH Left 06/18/2018   Procedure: INSERTION OF MESH;  Surgeon: Armandina Gemma, MD;  Location: [facility];  Service: General;  Laterality: Left;   LAPAROTOMY N/A 06/30/2018   Procedure: EXPLORATORY LAPAROTOMY LYSISIS OF ADHESIONS;  Surgeon: Jovita Kussmaul, MD;  Location: [facility];  Service: General;  Laterality: N/A;   PANCREAS SURGERY  2007   PERIPHERAL VASCULAR INTERVENTION  03/30/2018   Procedure: PERIPHERAL VASCULAR INTERVENTION;  Surgeon: Nigel Mormon, MD;  Location: [facility];  Service: Cardiovascular;;  Rt Iliac   UPPER GI ENDOSCOPY     VENTRAL HERNIA REPAIR  1967    Social History   Socioeconomic History   Marital status: Significant Other  Spouse name: Not on file   Number of children: 3   Years of education: 88   Highest education level: Not on file  Occupational History   Occupation: Retired  Tobacco Use   Smoking status: Every Day    Packs/day: 1.00    Years: 50.00    Pack years: 50.00    Types: Cigarettes   Smokeless tobacco: Never   Tobacco comments:    smoking 0.5 ppd as of 10/07/2018  Vaping Use   Vaping Use: Former  Substance and Sexual Activity   Alcohol use: Yes    Alcohol/week: 6.0 standard drinks    Types: 6 Cans of beer per week   Drug use: No   Sexual activity: Yes    Birth control/protection: Injection  Other Topics Concern   Not on file  Social  History Narrative   Lives at home w/ a friend   Left-handed   Caffeine: 2 cups of coffee per day   Social Determinants of Health   Financial Resource Strain: Not on file  Food Insecurity: Not on file  Transportation Needs: Not on file  Physical Activity: Not on file  Stress: Not on file  Social Connections: Not on file  Intimate Partner Violence: Not on file     Allergies  Allergen Reactions   Lansoprazole Itching and Other (See Comments)    "swell up with gas" "swell up with gas" "swell up with gas"   Varenicline Other (See Comments)    Physically ill Vivid dreams   Ciprofloxacin Other (See Comments)    GI Upset     Outpatient Medications Prior to Visit  Medication Sig Dispense Refill   acetaminophen (TYLENOL) 325 MG tablet Take 2 tablets (650 mg total) by mouth every 6 (six) hours as needed.     albuterol (PROVENTIL) (2.5 MG/3ML) 0.083% nebulizer solution Take 3 mLs (2.5 mg total) by nebulization every 2 (two) hours as needed for wheezing or shortness of breath. 75 mL 1   albuterol (PROVENTIL) (2.5 MG/3ML) 0.083% nebulizer solution Inhale into the lungs.     amLODipine (NORVASC) 10 MG tablet Take 10 mg by mouth every morning.      buPROPion (WELLBUTRIN XL) 150 MG 24 hr tablet Take 150 mg by mouth every morning.      fluticasone (FLONASE) 50 MCG/ACT nasal spray      ipratropium (ATROVENT) 0.02 % nebulizer solution      ipratropium-albuterol (DUONEB) 0.5-2.5 (3) MG/3ML SOLN Take 3 mLs by nebulization 3 (three) times daily. 360 mL 0   metoprolol succinate (TOPROL-XL) 50 MG 24 hr tablet Take 1 tablet (50 mg total) by mouth every morning. (Patient taking differently: Take 25 mg by mouth every morning.) 30 tablet 3   Multiple Vitamin (MULTIVITAMIN WITH MINERALS) TABS tablet Take 1 tablet by mouth every morning.     Nutritional Supplements (CARNATION BREAKFAST ESSENTIALS PO) Take 1 Container by mouth 2 (two) times daily.      budesonide (PULMICORT) 0.5 MG/2ML nebulizer solution  Take 2 mLs (0.5 mg total) by nebulization 2 (two) times daily. (Patient not taking: No sig reported) 56 mL 1   No facility-administered medications prior to visit.    Review of Systems  Constitutional:  Negative for chills, fever, malaise/fatigue and weight loss.  HENT:  Negative for hearing loss, sore throat and tinnitus.   Eyes:  Negative for blurred vision and double vision.  Respiratory:  Positive for cough and shortness of breath. Negative for hemoptysis, sputum production, wheezing and stridor.  Cardiovascular:  Negative for chest pain, palpitations, orthopnea, leg swelling and PND.  Gastrointestinal:  Negative for abdominal pain, constipation, diarrhea, heartburn, nausea and vomiting.  Genitourinary:  Negative for dysuria, hematuria and urgency.  Musculoskeletal:  Negative for joint pain and myalgias.  Skin:  Negative for itching and rash.  Neurological:  Negative for dizziness, tingling, weakness and headaches.  Endo/Heme/Allergies:  Negative for environmental allergies. Does not bruise/bleed easily.  Psychiatric/Behavioral:  Negative for depression. The patient is not nervous/anxious and does not have insomnia.   All other systems reviewed and are negative.   Objective:  Physical Exam Vitals reviewed.  Constitutional:      General: He is not in acute distress.    Appearance: He is well-developed.  HENT:     Head: Normocephalic and atraumatic.     Mouth/Throat:     Pharynx: No oropharyngeal exudate.  Eyes:     Conjunctiva/sclera: Conjunctivae normal.     Pupils: Pupils are equal, round, and reactive to light.  Neck:     Vascular: No JVD.     Trachea: No tracheal deviation.     Comments: Loss of supraclavicular fat Cardiovascular:     Rate and Rhythm: Normal rate and regular rhythm.     Heart sounds: S1 normal and S2 normal.     Comments: Distant heart tones Pulmonary:     Effort: No tachypnea or accessory muscle usage.     Breath sounds: No stridor. Decreased  breath sounds (throughout all lung fields) present. No wheezing, rhonchi or rales.     Comments: Diminished breath sounds bilaterally Abdominal:     General: Bowel sounds are normal. There is no distension.     Palpations: Abdomen is soft.     Tenderness: There is no abdominal tenderness.  Musculoskeletal:        General: Deformity (muscle wasting ) present.  Skin:    General: Skin is warm and dry.     Capillary Refill: Capillary refill takes less than 2 seconds.     Findings: No rash.  Neurological:     Mental Status: He is alert and oriented to person, place, and time.  Psychiatric:        Behavior: Behavior normal.     Vitals:   04/01/21 1121  BP: 124/64  Pulse: 65  Temp: 97.6 F (36.4 C)  TempSrc: Oral  SpO2: 94%  Weight: 80 lb (36.3 kg)  Height: 5\' 2"  (1.575 m)   94% on RA BMI Readings from Last 3 Encounters:  04/01/21 14.63 kg/m  10/20/18 14.83 kg/m  10/07/18 14.88 kg/m   Wt Readings from Last 3 Encounters:  04/01/21 80 lb (36.3 kg)  10/20/18 83 lb 11.2 oz (38 kg)  10/07/18 84 lb (38.1 kg)     CBC    Component Value Date/Time   WBC 4.7 07/07/2018 0525   RBC 3.04 (L) 07/07/2018 0525   HGB 9.6 (L) 07/07/2018 0525   HGB 14.0 11/20/2014 1352   HCT 29.8 (L) 07/07/2018 0525   HCT 42.2 11/20/2014 1352   PLT 187 07/07/2018 0525   PLT 206 11/20/2014 1352   MCV 98.0 07/07/2018 0525   MCV 93.2 11/20/2014 1352   MCH 31.6 07/07/2018 0525   MCHC 32.2 07/07/2018 0525   RDW 15.2 07/07/2018 0525   RDW 15.2 (H) 11/20/2014 1352   LYMPHSABS 0.5 (L) 07/05/2018 0426   LYMPHSABS 1.2 11/20/2014 1352   MONOABS 0.7 07/05/2018 0426   MONOABS 0.8 11/20/2014 1352   EOSABS 0.0 07/05/2018 0426  EOSABS 0.2 11/20/2014 1352   BASOSABS 0.0 07/05/2018 0426   BASOSABS 0.1 11/20/2014 1352     Chest Imaging: 01/16/2021 CT chest: Left upper lobe subpleural lesion concerning for malignancy, a slowly enlarging opacity within the left upper lobe along the major fissure line as  well as right lower lobe subpleural lesion difficult to exclude malignancy.  This is a gentleman that already had 2 previous lung cancers. The patient's images have been independently reviewed by me.    Pulmonary Functions Testing Results: PFT Results Latest Ref Rng & Units 10/30/2014  FVC-Pre L 2.54  FVC-Predicted Pre % 74  Pre FEV1/FVC % % 49  FEV1-Pre L 1.25  FEV1-Predicted Pre % 49  DLCO uncorrected ml/min/mmHg 7.75  DLCO UNC% % 35  DLVA Predicted % 43  TLC L 6.76  TLC % Predicted % 125  RV % Predicted % 217    FeNO:   Pathology:  2016 non-small cell lung cancer, left 2019: Right upper lobe non-small cell lung cancer  Echocardiogram:   Heart Catheterization:     Assessment & Plan:     ICD-10-CM   1. Lung nodules  R91.8 CT Super D Chest Wo Contrast    Ambulatory referral to Pulmonology    Procedural/ Surgical Case Request: VIDEO BRONCHOSCOPY WITH ENDOBRONCHIAL NAVIGATION    2. NSCLC of right lung ([place])  C34.91     3. NSCLC of left lung ([place])  C34.92     4. Current smoker  F17.200     5. Centrilobular emphysema ([place])  J43.2       Discussion:  This is a 70 year old gentleman, current smoker, history of metachronous malignancies non-small cell lung cancer of the right lung and left lung status post SBRT.  He does have severe emphysema, low BMI, now with progressive lung nodules concerning for recurrence of new disease in his chest.  Plan: I think the most concerning lesion is the left upper lobe anterior subpleural lesion.  We will plan to go after this first. We discussed the risk benefits and alternatives of proceeding with navigational bronchoscopy today in the office to include bleeding and pneumothorax. Patient is agreeable to proceed. We will plan for bronchoscopy on 04/23/2021. Orders have been placed. He also needs a super D CT scan for diagnostic planning.  As for a strategic approach to his lesions I think we will start with the left upper lobe lesion  on the anterior wall. Then we can consider going after the more apical posterior lesion adjacent to the major fissure.  If he tolerates a biopsy and does well on the left side then we can consider going after the right lower lobe lesion  Follow-up appointment to discuss biopsy results to be scheduled with Eric Form, NP on 04/30/2019.    Current Outpatient Medications:    acetaminophen (TYLENOL) 325 MG tablet, Take 2 tablets (650 mg total) by mouth every 6 (six) hours as needed., Disp: , Rfl:    albuterol (PROVENTIL) (2.5 MG/3ML) 0.083% nebulizer solution, Take 3 mLs (2.5 mg total) by nebulization every 2 (two) hours as needed for wheezing or shortness of breath., Disp: 75 mL, Rfl: 1   albuterol (PROVENTIL) (2.5 MG/3ML) 0.083% nebulizer solution, Inhale into the lungs., Disp: , Rfl:    amLODipine (NORVASC) 10 MG tablet, Take 10 mg by mouth every morning. , Disp: , Rfl:    buPROPion (WELLBUTRIN XL) 150 MG 24 hr tablet, Take 150 mg by mouth every morning. , Disp: , Rfl:  fluticasone (FLONASE) 50 MCG/ACT nasal spray, , Disp: , Rfl:    ipratropium (ATROVENT) 0.02 % nebulizer solution, , Disp: , Rfl:    ipratropium-albuterol (DUONEB) 0.5-2.5 (3) MG/3ML SOLN, Take 3 mLs by nebulization 3 (three) times daily., Disp: 360 mL, Rfl: 0   metoprolol succinate (TOPROL-XL) 50 MG 24 hr tablet, Take 1 tablet (50 mg total) by mouth every morning. (Patient taking differently: Take 25 mg by mouth every morning.), Disp: 30 tablet, Rfl: 3   Multiple Vitamin (MULTIVITAMIN WITH MINERALS) TABS tablet, Take 1 tablet by mouth every morning., Disp: , Rfl:    Nutritional Supplements (CARNATION BREAKFAST ESSENTIALS PO), Take 1 Container by mouth 2 (two) times daily. , Disp: , Rfl:    budesonide (PULMICORT) 0.5 MG/2ML nebulizer solution, Take 2 mLs (0.5 mg total) by nebulization 2 (two) times daily. (Patient not taking: No sig reported), Disp: 56 mL, Rfl: 1  I spent 63 minutes dedicated to the care of this patient on the  date of this encounter to include pre-visit review of records, face-to-face time with the patient discussing conditions above, post visit ordering of testing, clinical documentation with the electronic health record, making appropriate referrals as documented, and communicating necessary findings to members of the patients care team.   Garner Nash, DO [facility] 04/01/2021 11:28 AM

## 2021-04-01 NOTE — Telephone Encounter (Signed)
Pt has been scheduled for ENB on 11/15 at 10:45.  He will get CT on 11/10 at [facility].  I spoke to [place] and she will get disk made and send to [facility].  Pt will get covid test on 11/11.  Spoke to pt & gave him all appt info.

## 2021-04-18 ENCOUNTER — Ambulatory Visit (HOSPITAL_COMMUNITY)
Admission: RE | Admit: 2021-04-18 | Discharge: 2021-04-18 | Disposition: A | Discharge status: Home / Self Care | Payer: Medicare Other | Source: Ambulatory Visit | Attending: Pulmonary Disease | Admitting: Pulmonary Disease

## 2021-04-18 DIAGNOSIS — R918 Other nonspecific abnormal finding of lung field: Secondary | ICD-10-CM | POA: Diagnosis not present

## 2021-04-18 DIAGNOSIS — R911 Solitary pulmonary nodule: Secondary | ICD-10-CM | POA: Diagnosis not present

## 2021-04-18 DIAGNOSIS — J439 Emphysema, unspecified: Secondary | ICD-10-CM | POA: Diagnosis not present

## 2021-04-18 DIAGNOSIS — I700 Atherosclerosis of aorta: Secondary | ICD-10-CM | POA: Diagnosis not present

## 2021-04-19 ENCOUNTER — Other Ambulatory Visit: Payer: Self-pay | Admitting: Pulmonary Disease

## 2021-04-19 ENCOUNTER — Encounter (HOSPITAL_COMMUNITY): Payer: Self-pay | Admitting: Pulmonary Disease

## 2021-04-19 LAB — SARS CORONAVIRUS 2 (TAT 6-24 HRS): SARS Coronavirus 2: NEGATIVE

## 2021-04-22 ENCOUNTER — Other Ambulatory Visit: Payer: Self-pay

## 2021-04-22 ENCOUNTER — Encounter (HOSPITAL_COMMUNITY): Payer: Self-pay | Admitting: Pulmonary Disease

## 2021-04-22 NOTE — Anesthesia Preprocedure Evaluation (Addendum)
Anesthesia Evaluation  Patient identified by MRN, date of birth, ID band Patient awake    Reviewed: Allergy & Precautions, H&P , NPO status , Patient's Chart, lab work & pertinent test results  Airway Mallampati: II   Neck ROM: full    Dental   Pulmonary COPD, Current Smoker,  Lung CA   breath sounds clear to auscultation       Cardiovascular hypertension, + Peripheral Vascular Disease   Rhythm:regular Rate:Normal     Neuro/Psych PSYCHIATRIC DISORDERS Anxiety    GI/Hepatic   Endo/Other    Renal/GU      Musculoskeletal   Abdominal   Peds  Hematology   Anesthesia Other Findings   Reproductive/Obstetrics                            Anesthesia Physical Anesthesia Plan  ASA: 3  Anesthesia Plan: General   Post-op Pain Management:    Induction: Intravenous  PONV Risk Score and Plan: 1 and Ondansetron, Dexamethasone and Treatment may vary due to age or medical condition  Airway Management Planned: Oral ETT  Additional Equipment:   Intra-op Plan:   Post-operative Plan: Extubation in OR  Informed Consent: I have reviewed the patients History and Physical, chart, labs and discussed the procedure including the risks, benefits and alternatives for the proposed anesthesia with the patient or authorized representative who has indicated his/her understanding and acceptance.     Dental advisory given  Plan Discussed with: CRNA, Anesthesiologist and Surgeon  Anesthesia Plan Comments: (PAT note written 04/22/2021 by Myra Gianotti, PA-C. )       Anesthesia Quick Evaluation

## 2021-04-22 NOTE — Progress Notes (Signed)
Anesthesia Chart Review: SAME DAY WORK-UP  Case: [id number] Date/Time: 04/23/21 1100   Procedure: VIDEO BRONCHOSCOPY WITH ENDOBRONCHIAL NAVIGATION (Bilateral) - ION w/ fiducial placement   Anesthesia type: General   Diagnosis: Lung nodules [R91.8]   Pre-op diagnosis: lung nodules   Location: [facility] / [facility] ENDOSCOPY   Surgeons: Garner Nash, DO       DISCUSSION: Patient is a 70 year old male scheduled for the above procedure. 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  The first 1 in 2016 and the other in 2019 both treated with SBRT.  He has had subsequent follow-up imaging with radiation oncology.  Recent restaging PET scan with a slowly enlarging lesion within the right lower lobe that they could not rule out inflammatory versus malignancy as well as a more solid peripheral subpleural nodule within the left upper lobe concerning for malignancy." RAD-ONC discussed options of either proceeding with bronchoscopic biopsy for tissue confirmation versus proceeding with definitive stereotactic body radiotherapy (SBRT), and he opted to proceed with biopsy.   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 Plavix held and NGT placed. S/p hernia repair 06/18/18. He had post-operative hypoxia and increased confusion post-operatively and required ICU stay. CXR showed RLL opacity, likely atelectasis and chest congestion. Treated with O2 and diuresis, nebs, Mucinex. TNA started for severe protein malnutrition (was drinking 6 beers/day PTA). He had missed out-patient vascular surgery consultation due to hospitalization and was having persistent LLE claudication following right EIA stent 03/30/18 with known left CIA and CFA occlusion. Seen by Dr. Deitra Mayo and CTA ordered. Right-to-left FFBG and left FPBG could be considered but not felt to be a good candidate then due to acute general surgery issues. He had persistent abdominal pain and distention with CT showed severely dilated SB consistent with SOB, s/p exploratory laparotomy and LOA 06/30/18.  He self extubated 07/04/2018 and remains on supplemental O2.  NG tube removed 07/05/2018.  Diet slowly advanced.  Eventually weaned from supplemental oxygen and TNA.  He  was discharged home with [facility] therapies on 07/12/18, as he declined discharge to SNF.    He is a same-day work-up, so labs as indicated and anesthesia team evaluation on the day of surgery.  His last EKG noted is greater than 1 year ago.  Last cardiac testing was in 03/2018.  04/19/2021 presurgical COVID-19 test negative.  VS: Ht 5\' 2"  (1.575 m)   Wt 36.3 kg   BMI 14.63 kg/m  BP Readings from Last 3 Encounters:  04/01/21 124/64  10/24/19 (!) 154/81  10/20/18 (!) 151/93   Pulse Readings from Last 3 Encounters:  04/01/21 65  10/24/19 67  10/20/18 77      PROVIDERS: Janie Morning, DO is PCP [facility]) Vernell Leep, MD is cardiologist for PVD. Seen ~ 2019-2020. Was on  Plavix for right EIA stent, but discontinued 02/17/19.  Stress and echo had also been done in 03/2018 due to dyspnea and CAD risk factors (see below  CV). Deitra Mayo, MD is vascular surgeon. Seen in 2020 for PVD. Last visit 10/20/18. Given multiple medical co-morbidities and ongoing smoking and stable symptoms, advised conservative treatment for PVD at that time. Three month follow-up had been planned.   Tyler Pita, MD is RAD-ONC Modesto Charon, MD is CT surgeon. Last visit 12/21/14.    LABS: For day of surgery as indicated. As of 01/16/21, Cr 0.87. AST 16, AST 33, A1c 6.1%, WBC 5.8, H/H 12.6/36.8, PLT 319 as of 01/08/21 ([place]).    IMAGES: CT Super D Chest 04/18/21: IMPRESSION: 1. Unchanged irregular nodule of the anterior left lower lobe measuring 1.3 x 1.0 cm. This was previously PET avid and has enlarged over time, and remains highly suspicious for primary lung malignancy. 2. Slightly diminished subpleural consolidation of the medial posterior right lower lobe, previously PET avid, although most likely resolving infection or inflammation. Attention on follow-up. 3. 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 Attention on follow-up. 4. Multiple additional irregular, subsolid, and ground-glass nodules scattered throughout the lungs are unchanged and generally nonspecific. 5. Emphysema and diffuse bilateral bronchial wall thickening, 6. Coronary artery disease. - Aortic Atherosclerosis (ICD10-I70.0) and Emphysema (ICD10-J43.9).  PET Scan 02/25/21: IMPRESSION: 1. The 1.4 by 1.0 cm anterior left lower lobe nodule of concern has a maximum SUV of 3.4, high suspicion for malignancy. 2. Increase in size of the irregular opacity along the right medial costophrenic angle, currently about 3.8 by 1.2 cm, maximum SUV 3.4. Although morphologically this favors an inflammatory or infectious process, the persistence and enlargement makes it difficult to exclude  underlying malignancy. 3. Multifocal regions of architectural distortion and opacity similar to recent chest CT. Posteriorly in both upper lobes there are indistinctly marginated regions which have increased in prominence over the last year or 2, this could be from radiation fibrosis of these have been previously treated lesions, otherwise despite the low-grade activity possibility of multifocal adenocarcinoma must be considered. 4. Other imaging findings of potential clinical significance: Chronic left ethmoid sinusitis. Dense atherosclerosis including the abdominal aorta. Aortic Atherosclerosis (ICD10-I70.0). Chronic calcific pancreatitis.    EKG: Last EKG seen is > 1 year. 06/17/18: NSR, septal infarct (age undetermined).   CV: Per 06/16/18 cardiology consult note by Dr. Virgina Jock,  "Echocardiogram 03/26/2018: LVEF 52%. Mild MR, mod TR. Mild PI. PASP 48 mmHg."   Nuclear stress test 03/22/18 [facility] CV, scanned under Media tab, Correspondence Enc 03/30/18): Impression: 1.  The resting electrocardiogram demonstrated normal sinus rhythm, incomplete right bundle branch block and no resting arrhythmias.  Stress EKG is nondiagnostic for ischemia as it is a pharmacologic stress using Lexiscan.  Stress symptoms including dyspnea. 2.  Myocardial perfusion imaging is normal.  Overall left ventricular systolic function was normal without regional wall motion abnormalities.  The left ventricular ejection fraction was 65%.  This is a low risk study.   24 Hour Holter Monitor 03/12/12: ([facility]): FINAL IMPRESSIONS:  1.  Sinus rhythm with rates as described above.  2.  Occasional PVC's.  3.  Rare APC's.    4.  Symptoms were not associated with any significant arrhythmia.     Past Medical History:  Diagnosis Date   Allergies  being around corn & grain causes itchy eyes, nasal congestion. He can eat these with no problems.   Anxiety    COPD (chronic obstructive pulmonary disease) (HCC)     Hypertension    Iliac artery stenosis, right (HCC)    80% Stenosis   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[place])    Lower limb ischemia 03/26/2018   Lung cancer    LUL adenocarcinoma 12/28/14, s/p SBRT; poorly differentiated NSCLC RUL 11/23/17, s/p SBRT   Pancreatitis    x2 stents placed to make patent duct to pancreas   Pulmonary hypertension (HCC)    PASP 48 mmHg 03/26/18 (echo, [facility])   Severe claudication ([place])    Status post left foot surgery    Tobacco abuse     Past Surgical History:  Procedure Laterality Date   ABDOMINAL AORTOGRAM W/LOWER EXTREMITY N/A 03/30/2018   Procedure: ABDOMINAL AORTOGRAM W/LOWER EXTREMITY;  Surgeon: Nigel Mormon, MD;  Location: [place] CV LAB;  Service: Cardiovascular;  Laterality: N/A;   COLECTOMY     COLOSTOMY     COLOSTOMY REVERSAL     EUS N/A 12/14/2014   Procedure: UPPER ENDOSCOPIC ULTRASOUND (EUS) LINEAR;  Surgeon: Milus Banister, MD;  Location: [facility] ENDOSCOPY;  Service: Endoscopy;  Laterality: N/A;   FOOT SURGERY Left 2006   INGUINAL HERNIA REPAIR Left 06/18/2018   Procedure: OPEN RECURRENT LEFT INGUINAL HERNIA REPAIR WITH MESH;  Surgeon: Armandina Gemma, MD;  Location: [facility] ORS;  Service: General;  Laterality: Left;   INSERTION OF MESH Left 06/18/2018   Procedure: INSERTION OF MESH;  Surgeon: Armandina Gemma, MD;  Location: [facility] ORS;  Service: General;  Laterality: Left;   LAPAROTOMY N/A 06/30/2018   Procedure: EXPLORATORY LAPAROTOMY LYSISIS OF ADHESIONS;  Surgeon: Jovita Kussmaul, MD;  Location: [facility] ORS;  Service: General;  Laterality: N/A;   PANCREAS SURGERY  2007   PERIPHERAL VASCULAR INTERVENTION  03/30/2018   Procedure: PERIPHERAL VASCULAR INTERVENTION;  Surgeon: Nigel Mormon, MD;  Location: [place] CV LAB;  Service: Cardiovascular;;  Rt Iliac   UPPER GI ENDOSCOPY     VENTRAL HERNIA REPAIR  1967    MEDICATIONS: No current facility-administered medications for this encounter.    acetaminophen (TYLENOL) 500 MG tablet    albuterol (PROVENTIL) (2.5 MG/3ML) 0.083% nebulizer solution   albuterol (VENTOLIN HFA) 108 (90 Base) MCG/ACT inhaler   amLODipine (NORVASC) 10 MG tablet   buPROPion (WELLBUTRIN XL) 150 MG 24 hr tablet   fluticasone (FLONASE) 50 MCG/ACT nasal spray   ipratropium-albuterol (DUONEB) 0.5-2.5 (3) MG/3ML SOLN   lactose free nutrition (BOOST) LIQD   metoprolol succinate (TOPROL-XL) 25 MG 24 hr tablet   Multiple Vitamin (MULTIVITAMIN WITH MINERALS) TABS tablet   Nutritional Supplements (CARNATION BREAKFAST ESSENTIALS PO)    Myra Gianotti, PA-C Surgical Short Stay/Anesthesiology [facility] Phone [phone] [facility] Phone [phone] 04/22/2021 1:10 PM

## 2021-04-22 NOTE — Progress Notes (Signed)
DUE TO COVID-19 ONLY ONE VISITOR IS ALLOWED TO COME WITH YOU AND STAY IN THE WAITING ROOM ONLY DURING PRE OP AND PROCEDURE DAY OF SURGERY.   PCP - Dr Janie Morning (New provider) Cardiologist - n/a  CT Chest x-ray - 04/18/21 EKG - DOS 04/23/21 Stress Test - 03/2018 ECHO - n/a Cardiac Cath - n/a  ICD Pacemaker/Loop - n/a  Sleep Study -  n/a CPAP - none  Anesthesia review: Yes  STOP now taking any Aspirin (unless otherwise instructed by your surgeon), Aleve, Naproxen, Ibuprofen, Motrin, Advil, Goody's, BC's, all herbal medications, fish oil, and all vitamins.   Coronavirus Screening Covid test on 04/19/21 was negative.    Do you have any of the following symptoms:  Cough yes/no: No Fever (>100.43F)  yes/no: No Runny nose yes/no: No Sore throat yes/no: No Difficulty breathing/shortness of breath  yes/no: No  Have you traveled in the last 14 days and where? yes/no: No  Patient verbalized understanding of instructions that were given via phone.

## 2021-04-23 ENCOUNTER — Ambulatory Visit (HOSPITAL_COMMUNITY): Payer: Medicare Other | Admitting: Vascular Surgery

## 2021-04-23 ENCOUNTER — Ambulatory Visit (HOSPITAL_COMMUNITY): Payer: Medicare Other

## 2021-04-23 ENCOUNTER — Encounter (HOSPITAL_COMMUNITY): Payer: Self-pay | Admitting: Pulmonary Disease

## 2021-04-23 ENCOUNTER — Encounter (HOSPITAL_COMMUNITY)
Admission: RE | Disposition: A | Discharge status: Home / Self Care | Payer: Self-pay | Source: Home / Self Care | Attending: Pulmonary Disease

## 2021-04-23 ENCOUNTER — Ambulatory Visit (HOSPITAL_COMMUNITY)
Admission: RE | Admit: 2021-04-23 | Discharge: 2021-04-23 | Disposition: A | Discharge status: Home / Self Care | Payer: Medicare Other | Attending: Pulmonary Disease | Admitting: Pulmonary Disease

## 2021-04-23 ENCOUNTER — Other Ambulatory Visit: Payer: Self-pay

## 2021-04-23 DIAGNOSIS — I10 Essential (primary) hypertension: Secondary | ICD-10-CM | POA: Diagnosis not present

## 2021-04-23 DIAGNOSIS — I739 Peripheral vascular disease, unspecified: Secondary | ICD-10-CM | POA: Diagnosis not present

## 2021-04-23 DIAGNOSIS — C3411 Malignant neoplasm of upper lobe, right bronchus or lung: Secondary | ICD-10-CM | POA: Insufficient documentation

## 2021-04-23 DIAGNOSIS — J432 Centrilobular emphysema: Secondary | ICD-10-CM | POA: Diagnosis not present

## 2021-04-23 DIAGNOSIS — R911 Solitary pulmonary nodule: Secondary | ICD-10-CM | POA: Diagnosis not present

## 2021-04-23 DIAGNOSIS — Z9889 Other specified postprocedural states: Secondary | ICD-10-CM

## 2021-04-23 DIAGNOSIS — Z419 Encounter for procedure for purposes other than remedying health state, unspecified: Secondary | ICD-10-CM

## 2021-04-23 DIAGNOSIS — R918 Other nonspecific abnormal finding of lung field: Secondary | ICD-10-CM

## 2021-04-23 DIAGNOSIS — G9341 Metabolic encephalopathy: Secondary | ICD-10-CM | POA: Diagnosis not present

## 2021-04-23 DIAGNOSIS — C3432 Malignant neoplasm of lower lobe, left bronchus or lung: Secondary | ICD-10-CM | POA: Diagnosis not present

## 2021-04-23 DIAGNOSIS — J449 Chronic obstructive pulmonary disease, unspecified: Secondary | ICD-10-CM | POA: Diagnosis not present

## 2021-04-23 DIAGNOSIS — F17210 Nicotine dependence, cigarettes, uncomplicated: Secondary | ICD-10-CM | POA: Diagnosis not present

## 2021-04-23 DIAGNOSIS — E871 Hypo-osmolality and hyponatremia: Secondary | ICD-10-CM | POA: Diagnosis not present

## 2021-04-23 DIAGNOSIS — Z85118 Personal history of other malignant neoplasm of bronchus and lung: Secondary | ICD-10-CM | POA: Diagnosis not present

## 2021-04-23 DIAGNOSIS — J439 Emphysema, unspecified: Secondary | ICD-10-CM | POA: Diagnosis not present

## 2021-04-23 DIAGNOSIS — I272 Pulmonary hypertension, unspecified: Secondary | ICD-10-CM | POA: Insufficient documentation

## 2021-04-23 HISTORY — PX: VIDEO BRONCHOSCOPY WITH RADIAL ENDOBRONCHIAL ULTRASOUND: SHX6849

## 2021-04-23 HISTORY — PX: BRONCHIAL NEEDLE ASPIRATION BIOPSY: SHX5106

## 2021-04-23 HISTORY — PX: BRONCHIAL BIOPSY: SHX5109

## 2021-04-23 HISTORY — PX: BRONCHIAL BRUSHINGS: SHX5108

## 2021-04-23 HISTORY — PX: FIDUCIAL MARKER PLACEMENT: SHX6858

## 2021-04-23 HISTORY — DX: Anxiety disorder, unspecified: F41.9

## 2021-04-23 LAB — CBC
HCT: 41.4 % (ref 39.0–52.0)
Hemoglobin: 13.6 g/dL (ref 13.0–17.0)
MCH: 31.3 pg (ref 26.0–34.0)
MCHC: 32.9 g/dL (ref 30.0–36.0)
MCV: 95.4 fL (ref 80.0–100.0)
Platelets: 232 10*3/uL (ref 150–400)
RBC: 4.34 MIL/uL (ref 4.22–5.81)
RDW: 14.6 % (ref 11.5–15.5)
WBC: 3.7 10*3/uL — ABNORMAL LOW (ref 4.0–10.5)
nRBC: 0 % (ref 0.0–0.2)

## 2021-04-23 LAB — BASIC METABOLIC PANEL
Anion gap: 10 (ref 5–15)
BUN: 18 mg/dL (ref 8–23)
CO2: 26 mmol/L (ref 22–32)
Calcium: 8.6 mg/dL — ABNORMAL LOW (ref 8.9–10.3)
Chloride: 95 mmol/L — ABNORMAL LOW (ref 98–111)
Creatinine, Ser: 0.79 mg/dL (ref 0.61–1.24)
GFR, Estimated: >60 mL/min (ref >60)
Glucose, Bld: 95 mg/dL (ref 70–99)
Potassium: 3.6 mmol/L (ref 3.5–5.1)
Sodium: 131 mmol/L — ABNORMAL LOW (ref 135–145)

## 2021-04-23 SURGERY — BRONCHOSCOPY, WITH BIOPSY USING ELECTROMAGNETIC NAVIGATION
Anesthesia: General | Laterality: Bilateral

## 2021-04-23 MED ORDER — OXYCODONE HCL 5 MG/5ML PO SOLN: 5.0000 mg | Freq: Once | ORAL | Status: DC | PRN

## 2021-04-23 MED ORDER — OXYCODONE HCL 5 MG PO TABS: 5.0000 mg | ORAL_TABLET | Freq: Once | ORAL | Status: DC | PRN

## 2021-04-23 MED ORDER — SUGAMMADEX SODIUM 200 MG/2ML IV SOLN
INTRAVENOUS | Status: DC | PRN
  Administered 2021-04-23 (×2): 50 mg via INTRAVENOUS

## 2021-04-23 MED ORDER — PHENYLEPHRINE HCL-NACL 20-0.9 MG/250ML-% IV SOLN
INTRAVENOUS | Status: DC | PRN
  Administered 2021-04-23: 40 ug/min via INTRAVENOUS

## 2021-04-23 MED ORDER — ONDANSETRON HCL 4 MG/2ML IJ SOLN
INTRAMUSCULAR | Status: DC | PRN
  Administered 2021-04-23: 4 mg via INTRAVENOUS

## 2021-04-23 MED ORDER — PROPOFOL 10 MG/ML IV BOLUS
INTRAVENOUS | Status: DC | PRN
  Administered 2021-04-23: 10 mg via INTRAVENOUS
  Administered 2021-04-23: 60 mg via INTRAVENOUS

## 2021-04-23 MED ORDER — FENTANYL CITRATE (PF) 100 MCG/2ML IJ SOLN: 25.0000 ug | INTRAMUSCULAR | Status: DC | PRN

## 2021-04-23 MED ORDER — ONDANSETRON HCL 4 MG/2ML IJ SOLN: 4.0000 mg | Freq: Four times a day (QID) | INTRAMUSCULAR | Status: DC | PRN

## 2021-04-23 MED ORDER — DEXAMETHASONE SODIUM PHOSPHATE 10 MG/ML IJ SOLN
INTRAMUSCULAR | Status: DC | PRN
Start: 2021-04-23 — End: 2021-04-23
  Administered 2021-04-23: 5 mg via INTRAVENOUS

## 2021-04-23 MED ORDER — CHLORHEXIDINE GLUCONATE 0.12 % MT SOLN
OROMUCOSAL | Status: AC
  Administered 2021-04-23: 15 mL via OROMUCOSAL
  Filled 2021-04-23: qty 15

## 2021-04-23 MED ORDER — FENTANYL CITRATE (PF) 100 MCG/2ML IJ SOLN
INTRAMUSCULAR | Status: DC | PRN
  Administered 2021-04-23: 25 ug via INTRAVENOUS

## 2021-04-23 MED ORDER — LIDOCAINE 2% (20 MG/ML) 5 ML SYRINGE
INTRAMUSCULAR | Status: DC | PRN
  Administered 2021-04-23: 30 mg via INTRAVENOUS

## 2021-04-23 MED ORDER — LACTATED RINGERS IV SOLN: INTRAVENOUS | Status: DC

## 2021-04-23 MED ORDER — CHLORHEXIDINE GLUCONATE 0.12 % MT SOLN: 15.0000 mL | Freq: Once | OROMUCOSAL | Status: AC

## 2021-04-23 MED ORDER — ROCURONIUM BROMIDE 10 MG/ML (PF) SYRINGE
PREFILLED_SYRINGE | INTRAVENOUS | Status: DC | PRN
  Administered 2021-04-23: 40 mg via INTRAVENOUS

## 2021-04-23 SURGICAL SUPPLY — 1 items: superlock fiducial marker ×3 IMPLANT

## 2021-04-23 NOTE — Transfer of Care (Signed)
Immediate Anesthesia Transfer of Care Note  Patient: [name]  Procedure(s) Performed: ROBOTIC ASSISTED NAVIGATIONAL BRONCHOSCOPY (Bilateral) BRONCHIAL BIOPSIES BRONCHIAL BRUSHINGS BRONCHIAL NEEDLE ASPIRATION BIOPSIES VIDEO BRONCHOSCOPY WITH RADIAL ENDOBRONCHIAL ULTRASOUND FIDUCIAL MARKER PLACEMENT  Patient Location: PACU  Anesthesia Type:General  Level of Consciousness: awake  Airway & Oxygen Therapy: Patient Spontanous Breathing and Patient connected to face mask oxygen  Post-op Assessment: Report given to RN and Post -op Vital signs reviewed and stable  Post vital signs: Reviewed and stable  Last Vitals:  Vitals Value Taken Time  BP 143/86 04/23/21 1227  Temp    Pulse 80 04/23/21 1230  Resp 18 04/23/21 1230  SpO2 83 % 04/23/21 1230  Vitals shown include unvalidated device data.  Last Pain:  Vitals:   04/23/21 0901  TempSrc:   PainSc: 0-No pain         Complications: No notable events documented.

## 2021-04-23 NOTE — Op Note (Signed)
Video Bronchoscopy with Robotic Assisted Bronchoscopic Navigation   Date of Operation: 04/23/2021   Pre-op Diagnosis: Lung nodules  Post-op Diagnosis: Lung nodules  Surgeon: Garner Nash, DO   Assistants: None   Anesthesia: General endotracheal anesthesia  Operation: Flexible video fiberoptic bronchoscopy with robotic assistance and biopsies.  Estimated Blood Loss: Minimal  Complications: None  Indications and History: [name] is a 70 y.o. male with history of lung nodules, history of bilateral stage I lung cancers. The risks, benefits, complications, treatment options and expected outcomes were discussed with the patient.  The possibilities of pneumothorax, pneumonia, reaction to medication, pulmonary aspiration, perforation of a viscus, bleeding, failure to diagnose a condition and creating a complication requiring transfusion or operation were discussed with the patient who freely signed the consent.    Description of Procedure: The patient was seen in the Preoperative Area, was examined and was deemed appropriate to proceed.  The patient was taken to [facility] endoscopy room 3, identified as [name] and the procedure verified as Flexible Video Fiberoptic Bronchoscopy.  A Time Out was held and the above information confirmed.   Prior to the date of the procedure a high-resolution CT scan of the chest was performed. Utilizing ION software program a virtual tracheobronchial tree was generated to allow the creation of distinct navigation pathways to the patient's parenchymal abnormalities. After being taken to the operating room general anesthesia was initiated and the patient  was orally intubated. The video fiberoptic bronchoscope was introduced via the endotracheal tube and a general inspection was performed which showed normal right and left lung anatomy, aspiration of the bilateral mainstems was completed to remove any remaining secretions. Robotic catheter inserted into  patient's endotracheal tube.   Target #1 left lower lobe peripheral nodule: The distinct navigation pathways prepared prior to this procedure were then utilized to navigate to patient's lesion identified on CT scan. The robotic catheter was secured into place and the vision probe was withdrawn.  Lesion location was approximated using fluoroscopy and radial endobronchial ultrasound for peripheral targeting.  Lesion was identified with three-dimensional cone beam CT imaging.  Under fluoroscopic guidance transbronchial needle brushings, transbronchial needle biopsies, and transbronchial forceps biopsies were performed to be sent for cytology and pathology.  After tissue sampling a single fiducial was placed under fluoroscopic guidance approximately 2 cm from lesional center.  Target #2 right upper lobe nodule, along the fissure line: The distinct navigation pathways prepared prior to this procedure were then utilized to navigate to patient's lesion identified on CT scan. The robotic catheter was secured into place and the vision probe was withdrawn.  Lesion location was approximated using fluoroscopy and radial endobronchial ultrasound for peripheral targeting.  Lesion was identified with three-dimensional cone beam CT imaging.  Under fluoroscopic guidance transbronchial needle brushings, transbronchial needle biopsies, and transbronchial forceps biopsies were performed to be sent for cytology and pathology. A bronchioalveolar lavage was performed in the right upper lobe and sent for cytology.  At the end of the procedure a general airway inspection was performed and there was no evidence of active bleeding. The bronchoscope was removed.  The patient tolerated the procedure well. There was no significant blood loss and there were no obvious complications. A post-procedural chest x-ray is pending.  Samples Target #1: 1. Transbronchial needle brushings from left lower lobe 2. Transbronchial Wang needle  biopsies from left lower lobe 3. Transbronchial forceps biopsies from left lower lobe  Samples Target #2: 1. Transbronchial needle brushings from right upper lobe  2. Transbronchial Wang needle biopsies from right upper lobe 3. Transbronchial forceps biopsies from right upper lobe 4. Bronchoalveolar lavage from right upper lobe  Plans:  The patient will be discharged from the PACU to home when recovered from anesthesia and after chest x-ray is reviewed. We will review the cytology, pathology and microbiology results with the patient when they become available. Outpatient followup will be with Garner Nash, DO.   Garner Nash, DO [facility] [place] [facility] 04/23/2021 12:32 PM

## 2021-04-23 NOTE — Anesthesia Procedure Notes (Signed)
Procedure Name: Intubation Date/Time: 04/23/2021 10:55 AM Performed by: Reece Agar, CRNA Pre-anesthesia Checklist: Patient identified, Emergency Drugs available, Suction available and Patient being monitored Patient Re-evaluated:Patient Re-evaluated prior to induction Oxygen Delivery Method: Circle system utilized Preoxygenation: Pre-oxygenation with 100% oxygen Induction Type: IV induction Ventilation: Mask ventilation without difficulty and Oral airway inserted - appropriate to patient size Laryngoscope Size: Mac and 4 Grade View: Grade I Tube type: Oral Tube size: 8.5 mm Number of attempts: 1 Airway Equipment and Method: Stylet and Oral airway Placement Confirmation: ETT inserted through vocal cords under direct vision, positive ETCO2 and breath sounds checked- equal and bilateral Secured at: 22 cm Tube secured with: Tape Dental Injury: Teeth and Oropharynx as per pre-operative assessment

## 2021-04-23 NOTE — Interval H&P Note (Signed)
History and Physical Interval Note:  04/23/2021 8:59 AM  [name]  has presented today for surgery, with the diagnosis of lung nodules.  The various methods of treatment have been discussed with the patient and family. After consideration of risks, benefits and other options for treatment, the patient has consented to  Procedure(s) with comments: ROBOTIC ASSISTED NAVIGATIONAL BRONCHOSCOPY (Bilateral) - ION w/ fiducial placement as a surgical intervention.  The patient's history has been reviewed, patient examined, no change in status, stable for surgery.  I have reviewed the patient's chart and labs.  Questions were answered to the patient's satisfaction.     [place]

## 2021-04-23 NOTE — Discharge Instructions (Signed)
Flexible Bronchoscopy, Care After This sheet gives you information about how to care for yourself after your test. Your doctor may also give you more specific instructions. If you have problems or questions, contact your doctor. Follow these instructions at home: Eating and drinking Do not eat or drink anything (not even water) for 2 hours after your test, or until your numbing medicine (local anesthetic) wears off. When your numbness is gone and your cough and gag reflexes have come back, you may: Eat only soft foods. Slowly drink liquids. The day after the test, go back to your normal diet. Driving Do not drive for 24 hours if you were given a medicine to help you relax (sedative). Do not drive or use heavy machinery while taking prescription pain medicine. General instructions  Take over-the-counter and prescription medicines only as told by your doctor. Return to your normal activities as told. Ask what activities are safe for you. Do not use any products that have nicotine or tobacco in them. This includes cigarettes and e-cigarettes. If you need help quitting, ask your doctor. Keep all follow-up visits as told by your doctor. This is important. It is very important if you had a tissue sample (biopsy) taken. Get help right away if: You have shortness of breath that gets worse. You get light-headed. You feel like you are going to pass out (faint). You have chest pain. You cough up: More than a little blood. More blood than before. Summary Do not eat or drink anything (not even water) for 2 hours after your test, or until your numbing medicine wears off. Do not use cigarettes. Do not use e-cigarettes. Get help right away if you have chest pain.  This information is not intended to replace advice given to you by your health care provider. Make sure you discuss any questions you have with your health care provider. Document Released: 03/23/2009 Document Revised: 05/08/2017 Document  Reviewed: 06/13/2016 Elsevier Patient Education  2020 [employer].

## 2021-04-24 LAB — ACID FAST SMEAR (AFB, MYCOBACTERIA): Acid Fast Smear: NEGATIVE

## 2021-04-24 NOTE — Anesthesia Postprocedure Evaluation (Signed)
Anesthesia Post Note  Patient: [name]  Procedure(s) Performed: ROBOTIC ASSISTED NAVIGATIONAL BRONCHOSCOPY (Bilateral) BRONCHIAL BIOPSIES BRONCHIAL BRUSHINGS BRONCHIAL NEEDLE ASPIRATION BIOPSIES VIDEO BRONCHOSCOPY WITH RADIAL ENDOBRONCHIAL ULTRASOUND FIDUCIAL MARKER PLACEMENT     Patient location during evaluation: PACU Anesthesia Type: General Level of consciousness: awake and alert Pain management: pain level controlled Vital Signs Assessment: post-procedure vital signs reviewed and stable Respiratory status: spontaneous breathing, nonlabored ventilation, respiratory function stable and patient connected to nasal cannula oxygen Cardiovascular status: blood pressure returned to baseline and stable Postop Assessment: no apparent nausea or vomiting Anesthetic complications: no   No notable events documented.  Last Vitals:  Vitals:   04/23/21 1257 04/23/21 1315  BP: 121/85 113/80  Pulse: 60 (!) 57  Resp: 14 20  Temp:  (!) 36.3 C  SpO2: 100% 95%    Last Pain:  Vitals:   04/23/21 1315  TempSrc:   PainSc: 0-No pain                 Rexine Gowens S

## 2021-04-25 ENCOUNTER — Telehealth: Payer: Self-pay | Admitting: Urology

## 2021-04-25 LAB — CULTURE, BAL-QUANTITATIVE W GRAM STAIN
Culture: NO GROWTH
Gram Stain: NONE SEEN

## 2021-04-25 LAB — CYTOLOGY - NON PAP

## 2021-04-25 NOTE — Telephone Encounter (Signed)
I called to speak with [name] to share the results of his recent biopsy but his phone sent me straight to voicemail so I left a detailed message. We also have permission from him to share information with his significant other, [name], so I was able to reach her by phone and share that the 2 lung nodules that were biopsied on 04/23/21 did show NSCLC, adenocarcinoma and that our recommendation is to proceed with SBRT for treatment of the lesions since he has done well with this type of radiation previously. Following a detailed discussion, she appeared to have a good understanding of the disease and our recommendations and felt certain that [name] would want to proceed with the SBRT. She will share this information with him directly once she gets back home this afternoon and I advised her that they are more than welcome to call with any questions or concerns. Otherwise, I will move forward with coordinating the CT SIM/treatment planning for early next week, in anticipation of beginning a 3-5 fx course of SBRT to the LLL and RUL lesions after Thanksgiving.  Nicholos Johns, MMS, PA-C [place] [facility] [place]: [phone]  Fax: [phone]

## 2021-04-26 ENCOUNTER — Encounter (HOSPITAL_COMMUNITY): Payer: Self-pay | Admitting: Pulmonary Disease

## 2021-04-28 LAB — ANAEROBIC CULTURE W GRAM STAIN: Gram Stain: NONE SEEN

## 2021-04-29 ENCOUNTER — Ambulatory Visit (INDEPENDENT_AMBULATORY_CARE_PROVIDER_SITE_OTHER): Payer: Medicare Other | Admitting: Acute Care

## 2021-04-29 ENCOUNTER — Other Ambulatory Visit: Payer: Self-pay

## 2021-04-29 ENCOUNTER — Encounter: Payer: Self-pay | Admitting: Acute Care

## 2021-04-29 VITALS — BP 122/68 | HR 63 | Temp 97.6°F | Ht 62.0 in | Wt 84.8 lb

## 2021-04-29 DIAGNOSIS — J432 Centrilobular emphysema: Secondary | ICD-10-CM

## 2021-04-29 DIAGNOSIS — C3490 Malignant neoplasm of unspecified part of unspecified bronchus or lung: Secondary | ICD-10-CM | POA: Diagnosis not present

## 2021-04-29 MED ORDER — BREZTRI AEROSPHERE 160-9-4.8 MCG/ACT IN AERO: 2.0000 | INHALATION_SPRAY | Freq: Two times a day (BID) | RESPIRATORY_TRACT | 0 refills | Status: DC

## 2021-04-29 NOTE — Patient Instructions (Addendum)
It is good to see you today.  You have appointments scheduled with Dr. Tammi Klippel at Radiation oncology. I will place a referral for you to be seen by Dr. Valeta Harms  for your COPD. We will do a therapeutic trial of Breztri 2 puffs twice daily.  We will give you a spacer to use with this.  Rinse mouth after use. Stop Duonebs while using Breztri If you do not feel Judithann Sauger works, go back to [employer]  Please schedule at Dr. Fabio Bering earliest consult opening. Please contact office for sooner follow up if symptoms do not improve or worsen or seek emergency care

## 2021-04-29 NOTE — Progress Notes (Signed)
History of Present Illness [name] is a 70 y.o. male former smoker with PMH of COPD,  hypertension, tobacco abuse.  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  The first one  in 2016 and the other in 2019 both treated with SBRT.  He has had subsequent follow-up imaging with radiation oncology.  Recent restaging PET scan with a slowly enlarging lesion within the right lower lobe that they could not rule out inflammatory versus malignancy as well as a more solid peripheral subpleural nodule within the left upper lobe concerning for malignancy.  He was referred to Dr. Valeta Harms for tissue biopsy of both lesions.    04/29/2021 Pt. Presents for follow up with his wife. We discussed that his biopsies were +  for adenocarcinoma. He was already aware of this. Plan is for SBRT with Dr. Tammi Klippel through radiation oncology.  He has a[appointment 11/23 for preliminary markings and tattoos, then he will begin treatment   the following week.  He states he has been doing well since the procedure. He did not have a sore throat after the procedure.  He did cough up some blood for a few days, but that self resolved.  He is using his Duonebs 3 times daily. He denies any shortness of breath. He does not wear oxygen at baseline. He does have a cough , but secretions are clear. He has not had fever since procedure.   He is not seen by a pulmonologist at the practice. He would like to be seen by a pulmonary doctor, as his severe COPD is being managed by his PCP.  We will have him follow up with a pulmonologist as a consult for his COPD. He has not had PFT's since 2016. They were + for severe obstruction with emphysema   Test Results: Cytology 04/23/2021 FINAL MICROSCOPIC DIAGNOSIS:   C. LUNG, RUL, BRUSHING:  - No malignant cells identified    D. LUNG, RUL, FINE NEEDLE ASPIRATION:  - Malignant cells consistent with adenocarcinoma  - See comment    A. LUNG, LLL, FINE NEEDLE  ASPIRATION:  - Malignant cells consistent with adenocarcinoma  - See comment   B. LUNG, LLL, BRUSHING:  - Atypical cells present   04/23/2021: Sputum and AFB>> Negative    Chest Imaging: 01/16/2021 CT chest: Left upper lobe subpleural lesion concerning for malignancy, a slowly enlarging opacity within the left upper lobe along the major fissure line as well as right lower lobe subpleural lesion difficult to exclude malignancy.  This is a gentleman that already had 2 previous lung cancers.     Pulmonary Functions Testing Results: PFT Results Latest Ref Rng & Units 10/30/2014  FVC-Pre L 2.54  FVC-Predicted Pre % 74  Pre FEV1/FVC % % 49  FEV1-Pre L 1.25  FEV1-Predicted Pre % 49  DLCO uncorrected ml/min/mmHg 7.75  DLCO UNC% % 35  DLVA Predicted % 43  TLC L 6.76  TLC % Predicted % 125  RV % Predicted % 217      FeNO:    Pathology:  2016 non-small cell lung cancer, left 2019: Right upper lobe non-small cell lung cancer 2022:     CBC Latest Ref Rng & Units 04/23/2021 07/07/2018 07/06/2018  WBC 4.0 - 10.5 K/uL 3.7(L) 4.7 8.2  Hemoglobin 13.0 - 17.0 g/dL 13.6 9.6(L) 10.3(L)  Hematocrit 39.0 - 52.0 % 41.4 29.8(L) 31.8(L)  Platelets 150 - 400 K/uL 232 187 216    BMP Latest Ref Rng & Units  04/23/2021 01/16/2021 07/16/2020  Glucose 70 - 99 mg/dL 95 - -  BUN 8 - 23 mg/dL 18 15 16   Creatinine 0.61 - 1.24 mg/dL 0.79 0.87 0.88  BUN/Creat Ratio 10 - 24 - - -  Sodium 135 - 145 mmol/L 131(L) - -  Potassium 3.5 - 5.1 mmol/L 3.6 - -  Chloride 98 - 111 mmol/L 95(L) - -  CO2 22 - 32 mmol/L 26 - -  Calcium 8.9 - 10.3 mg/dL 8.6(L) - -    BNP No results found for: BNP  ProBNP No results found for: PROBNP  PFT    Component Value Date/Time   FEV1PRE 1.25 10/30/2014 1002   FVCPRE 2.54 10/30/2014 1002   TLC 6.76 10/30/2014 1002   DLCOUNC 7.75 10/30/2014 1002   PREFEV1FVCRT 49 10/30/2014 1002    DG CHEST PORT 1 VIEW  Result Date: 04/23/2021 CLINICAL DATA:  Bronchoscopy EXAM:  PORTABLE CHEST 1 VIEW COMPARISON:  04/18/2021, 07/26/2018 FINDINGS: Heart size is normal. Aortic atherosclerosis. Fiducial marker in the left lower lobe adjacent to known pulmonary nodule. Areas of nodular thickening within both lungs, unchanged. Lungs are hyperexpanded. No new focal airspace consolidation, pleural effusion, or pneumothorax. IMPRESSION: 1. No acute cardiopulmonary findings status post bronchoscopy. 2. Fiducial marker in the left lower lobe adjacent to known pulmonary nodule. 3. Emphysema. Electronically Signed   By: Davina Poke D.O.   On: 04/23/2021 13:05   DG C-Arm 1-60 Min-No Report  Result Date: 04/23/2021 Fluoroscopy was utilized by the requesting physician.  No radiographic interpretation.   CT Super D Chest Wo Contrast  Result Date: 04/19/2021 CLINICAL DATA:  Lung nodules EXAM: CT CHEST WITHOUT CONTRAST TECHNIQUE: Multidetector CT imaging of the chest was performed using thin slice collimation for electromagnetic bronchoscopy planning purposes, without intravenous contrast. COMPARISON:  PET-CT, 02/26/2021, CT chest 01/16/2021 FINDINGS: Cardiovascular: Aortic atherosclerosis. Normal heart size. Three-vessel coronary artery calcifications. No pericardial effusion. Mediastinum/Nodes: No enlarged mediastinal, hilar, or axillary lymph nodes. Thyroid gland, trachea, and esophagus demonstrate no significant findings. Lungs/Pleura: Moderate centrilobular emphysema. Diffuse bilateral bronchial wall thickening. Irregular nodule of the anterior left lower lobe is unchanged, measuring 1.3 x 1.0 cm (series 5, image 110). Irregular region of radiation fibrosis in the posterior left upper lobe, involving the fissure and adjacent superior segment left lower lobe is unchanged, measuring 3.0 x 1.8 cm (series 5, image 64). Unchanged irregular nodule of the peripheral right upper lobe measuring 1.0 x 0.9 cm (series 5, image 73). Unchanged subsolid mixed solid and cystic nodule of the posterior  right upper lobe measuring 1.3 x 1.1 cm (series 5, image 59). Slightly diminished subpleural consolidation of the medial posterior right lower lobe, (e.g. Series 5, image 136, compared to prior series 5, image 137) the dominant nodular component however still measuring approximately 1.6 x 0.9 cm (series 5, image 141). Additional scattered areas of subsolid and ground-glass opacity are unchanged, for example in the anterior right upper lobe (series 5, image 54) and in the posterior right upper lobe (series 5, image 37). No pleural effusion or pneumothorax. Upper Abdomen: No acute abnormality. Calcific stigmata of chronic pancreatitis. Musculoskeletal: No chest wall mass or suspicious bone lesions identified. IMPRESSION: 1. Unchanged irregular nodule of the anterior left lower lobe measuring 1.3 x 1.0 cm. This was previously PET avid and has enlarged over time, and remains highly suspicious for primary lung malignancy. 2. Slightly diminished subpleural consolidation of the medial posterior right lower lobe, previously PET avid, although most likely resolving infection or inflammation. Attention  on follow-up. 3. 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 Attention on follow-up. 4. Multiple additional irregular, subsolid, and ground-glass nodules scattered throughout the lungs are unchanged and generally nonspecific. 5. Emphysema and diffuse bilateral bronchial wall thickening, 6. Coronary artery disease. Aortic Atherosclerosis (ICD10-I70.0) and Emphysema (ICD10-J43.9). Electronically Signed   By: Delanna Ahmadi M.D.   On: 04/19/2021 10:37   DG C-ARM BRONCHOSCOPY  Result Date: 04/23/2021 C-ARM BRONCHOSCOPY: Fluoroscopy was utilized by the requesting physician.  No radiographic interpretation.     Past medical hx Past Medical History:  Diagnosis Date   Allergies     being around corn & grain causes itchy eyes, nasal congestion. He can eat these with no problems.   Anxiety    COPD (chronic obstructive pulmonary disease) (HCC)    Hypertension    Iliac artery stenosis, right (HCC)    80% Stenosis   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[place])    Lower limb ischemia 03/26/2018   Lung cancer    LUL adenocarcinoma 12/28/14, s/p SBRT; poorly differentiated NSCLC RUL 11/23/17, s/p SBRT   Pancreatitis    x2 stents placed to make patent duct to pancreas   Pulmonary hypertension (HCC)    PASP 48 mmHg 03/26/18 (echo, [facility])   Severe claudication ([place])    Status post left foot surgery    Tobacco abuse      Social History   Tobacco Use   Smoking status: Every Day    Packs/day: 1.00    Years: 50.00    Pack years: 50.00    Types: Cigarettes   Smokeless tobacco: Never   Tobacco comments:    smoking 0.5 ppd as of 10/07/2018    Now 1/4 ppd as of 04/22/21  Vaping Use   Vaping Use: Former  Substance Use Topics   Alcohol use: Yes    Alcohol/week: 6.0 standard drinks    Types: 6 Cans of beer per week   Drug use: Yes    Types: Marijuana    Comment: Last use 04/20/21    Mr.Chaudoin reports that he has been smoking cigarettes. He has a 50.00 pack-year smoking history. He has never used smokeless tobacco. He reports current alcohol use of about 6.0 standard drinks per week. He reports current drug use. Drug: Marijuana.  Tobacco Cessation: Current  every day smoker smoking 1/4 PPD  Past surgical hx, Family hx, Social hx all reviewed.  Current Outpatient Medications on File Prior to Visit  Medication Sig   acetaminophen (TYLENOL) 500 MG tablet Take 500 mg by mouth every 6 (six) hours as needed for mild pain.   albuterol (PROVENTIL) (2.5 MG/3ML) 0.083% nebulizer solution Take 3 mLs (2.5 mg total) by nebulization every 2 (two) hours as needed for wheezing or shortness of breath.   albuterol (VENTOLIN HFA) 108 (90 Base) MCG/ACT inhaler Inhale 1-2 puffs  into the lungs every 6 (six) hours as needed for shortness of breath.   amLODipine (NORVASC) 10 MG tablet Take 10 mg by mouth every morning.    buPROPion (WELLBUTRIN XL) 150 MG 24 hr tablet Take 150 mg by mouth every morning.    fluticasone (FLONASE) 50 MCG/ACT nasal spray Place 2 sprays into both nostrils daily as needed for allergies.   ipratropium-albuterol (DUONEB) 0.5-2.5 (3) MG/3ML SOLN Take 3 mLs by nebulization 3 (three) times daily.   lactose free nutrition (BOOST) LIQD Take 237 mLs  by mouth 3 (three) times daily between meals.   metoprolol succinate (TOPROL-XL) 25 MG 24 hr tablet Take 25 mg by mouth daily.   Multiple Vitamin (MULTIVITAMIN WITH MINERALS) TABS tablet Take 1 tablet by mouth every morning.   Nutritional Supplements (CARNATION BREAKFAST ESSENTIALS PO) Take 1 packet by mouth 2 (two) times daily.   No current facility-administered medications on file prior to visit.     Allergies  Allergen Reactions   Chantix [Varenicline] Nausea And Vomiting    Vivid dreams   Prevacid [Lansoprazole] Other (See Comments)    Bloating. "Swell up with gas"   Ciprofloxacin Other (See Comments)    GI Upset    Review Of Systems:  Constitutional:   No  weight loss, night sweats,  Fevers, chills, fatigue, or  lassitude.  HEENT:   No headaches,  Difficulty swallowing,  Tooth/dental problems, or  Sore throat,                No sneezing, itching, ear ache, nasal congestion, post nasal drip,   CV:  No chest pain,  Orthopnea, PND, swelling in lower extremities, anasarca, dizziness, palpitations, syncope.   GI  No heartburn, indigestion, abdominal pain, nausea, vomiting, diarrhea, change in bowel habits, loss of appetite, bloody stools.   Resp: + baseline  shortness of breath with exertion or at rest.  No excess mucus, no productive cough,  No non-productive cough,  No coughing up of blood.  No change in color of mucus.  No wheezing.  No chest wall deformity  Skin: no rash or lesions.  GU:  no dysuria, change in color of urine, no urgency or frequency.  No flank pain, no hematuria   MS:  No joint pain or swelling.  No decreased range of motion.  No back pain.  Psych:  No change in mood or affect. No depression or anxiety.  No memory loss.   Vital Signs BP 122/68 (BP Location: Left Arm, Patient Position: Sitting, Cuff Size: Normal)   Pulse 63   Temp 97.6 F (36.4 C) (Oral)   Ht 5\' 2"  (1.575 m)   Wt 84 lb 12.8 oz (38.5 kg)   SpO2 95%   BMI 15.51 kg/m    Physical Exam:  General- No distress,  A&Ox3, pleasant thin male ENT: No sinus tenderness, TM clear, pale nasal mucosa, no oral exudate,no post nasal drip, no LAN Cardiac: S1, S2, regular rate and rhythm, no murmur Chest: No wheeze/ rales/ dullness; no accessory muscle use, no nasal flaring, no sternal retractions, + rhonchi  Abd.: Soft Non-tender, thin, ND, Body mass index is 15.51 kg/m.  Ext: No clubbing cyanosis, edema Neuro:  A&O x 3, MAE x 4, appropriate Skin: No rashes, warm and dry, no lesions Psych: normal mood and behavior   Assessment/Plan  RUL and LLL Adenocarcinoma of the Lung per tissue sampling Plan You have appointments scheduled with Dr. Tammi Klippel at Radiation oncology for SBRT of the new lung lesions. Call for any hemoptysis , or if we can help you in any way  COPD, severe obstruction in 2016 Would like to be followed by Pulmonology for this  Plan I will place a referral for you to be seen by Dr. Valeta Harms  for your COPD. We will do a therapeutic trial of Breztri 2 puffs twice daily.  We will give you a spacer to use with this.  Rinse mouth after use. Stop Duonebs while using Breztri If you do not feel Judithann Sauger works, go back to [facility]  Please schedule at Dr. Fabio Bering earliest consult opening.  I spent 30 minutes dedicated to the care of this patient on the date of this encounter to include pre-visit review of records, face-to-face time with the patient discussing conditions above, post  visit ordering of testing, clinical documentation with the electronic health record, making appropriate referrals as documented, and communicating necessary information to the patient's healthcare team.   Magdalen Spatz, NP 04/29/2021  11:17 AM

## 2021-05-01 ENCOUNTER — Other Ambulatory Visit: Payer: Self-pay

## 2021-05-01 ENCOUNTER — Ambulatory Visit
Admission: RE | Admit: 2021-05-01 | Discharge: 2021-05-01 | Disposition: A | Discharge status: Home / Self Care | Payer: Medicare Other | Source: Ambulatory Visit | Attending: Radiation Oncology | Admitting: Radiation Oncology

## 2021-05-01 DIAGNOSIS — Z510 Encounter for antineoplastic radiation therapy: Secondary | ICD-10-CM | POA: Insufficient documentation

## 2021-05-01 DIAGNOSIS — F17210 Nicotine dependence, cigarettes, uncomplicated: Secondary | ICD-10-CM | POA: Diagnosis not present

## 2021-05-01 DIAGNOSIS — C3431 Malignant neoplasm of lower lobe, right bronchus or lung: Secondary | ICD-10-CM | POA: Insufficient documentation

## 2021-05-01 DIAGNOSIS — C3412 Malignant neoplasm of upper lobe, left bronchus or lung: Secondary | ICD-10-CM | POA: Diagnosis not present

## 2021-05-01 DIAGNOSIS — C3411 Malignant neoplasm of upper lobe, right bronchus or lung: Secondary | ICD-10-CM | POA: Diagnosis not present

## 2021-05-04 NOTE — Progress Notes (Signed)
  Radiation Oncology         [phone] ________________________________  Name: [name] MRN: [id number]  Date: 05/01/2021  DOB: 1950/12/21  STEREOTACTIC BODY RADIOTHERAPY SIMULATION AND TREATMENT PLANNING NOTE    ICD-10-CM   1. Primary cancer of right upper lobe of lung (HCC)  C34.11     2. Cancer of upper lobe of left lung (HCC)  C34.12       DIAGNOSIS:  70 yo male with a synchronous, Stage IA, NSCLC in the left upper lobe and right lower lobe of the lung  NARRATIVE:  The patient was brought to the [place].  Identity was confirmed.  All relevant records and images related to the planned course of therapy were reviewed.  The patient freely provided informed written consent to proceed with treatment after reviewing the details related to the planned course of therapy. The consent form was witnessed and verified by the simulation staff.  Then, the patient was set-up in a stable reproducible  supine position for radiation therapy.  A BodyFix immobilization pillow was fabricated for reproducible positioning.  Then I personally applied the abdominal compression paddle to limit respiratory excursion.  4D respiratoy motion management CT images were obtained.  Surface markings were placed.  The CT images were loaded into the planning software.  Then, using Cine, MIP, and standard views, the internal target volume (ITV) and planning target volumes (PTV) were delinieated, and avoidance structures were contoured.  Treatment planning then occurred.  The radiation prescription was entered and confirmed.  A total of two complex treatment devices were fabricated in the form of the BodyFix immobilization pillow and a neck accuform cushion.  I have requested : 3D Simulation  I have requested a DVH of the following structures: Heart, Lungs, Esophagus, Chest Wall, Brachial Plexus, Major Blood Vessels, and targets.  SPECIAL TREATMENT PROCEDURE:  The planned course of therapy using  radiation constitutes a special treatment procedure. Special care is required in the management of this patient for the following reasons. This treatment constitutes a Special Treatment Procedure for the following reason: [ High dose per fraction requiring special monitoring for increased toxicities of treatment including daily imaging..  The special nature of the planned course of radiotherapy will require increased physician supervision and oversight to ensure patient's safety with optimal treatment outcomes.  This requires extended time and effort.    RESPIRATORY MOTION MANAGEMENT SIMULATION:  In order to account for effect of respiratory motion on target structures and other organs in the planning and delivery of radiotherapy, this patient underwent respiratory motion management simulation.  To accomplish this, when the patient was brought to the CT simulation planning suite, 4D respiratoy motion management CT images were obtained.  The CT images were loaded into the planning software.  Then, using a variety of tools including Cine, MIP, and standard views, the target volume and planning target volumes (PTV) were delineated.  Avoidance structures were contoured.  Treatment planning then occurred.  Dose volume histograms were generated and reviewed for each of the requested structure.  The resulting plan was carefully reviewed and approved today.  PLAN:  The patient will receive 54 Gy in 3 fractions to the LUL and RLL tumors.  ________________________________  Sheral Apley Tammi Klippel, M.D.

## 2021-05-09 ENCOUNTER — Other Ambulatory Visit: Payer: Self-pay | Admitting: *Deleted

## 2021-05-09 DIAGNOSIS — F17210 Nicotine dependence, cigarettes, uncomplicated: Secondary | ICD-10-CM | POA: Diagnosis not present

## 2021-05-09 DIAGNOSIS — C3431 Malignant neoplasm of lower lobe, right bronchus or lung: Secondary | ICD-10-CM | POA: Insufficient documentation

## 2021-05-09 DIAGNOSIS — Z510 Encounter for antineoplastic radiation therapy: Secondary | ICD-10-CM | POA: Insufficient documentation

## 2021-05-09 DIAGNOSIS — C3411 Malignant neoplasm of upper lobe, right bronchus or lung: Secondary | ICD-10-CM | POA: Diagnosis not present

## 2021-05-09 NOTE — Progress Notes (Signed)
The proposed treatment discussed in cancer conference is for discussion purpose only and not a binding recommendation.  The patient was not physically examined nor present for their treatment options.  Therefore, final treatment plans cannot be decided.

## 2021-05-10 ENCOUNTER — Inpatient Hospital Stay (HOSPITAL_COMMUNITY)
Admission: EM | Admit: 2021-05-10 | Discharge: 2021-05-16 | DRG: 291 | Disposition: A | Discharge status: Home Health | Payer: Medicare Other | Attending: Internal Medicine | Admitting: Internal Medicine

## 2021-05-10 ENCOUNTER — Encounter (HOSPITAL_COMMUNITY): Payer: Self-pay | Admitting: Emergency Medicine

## 2021-05-10 ENCOUNTER — Emergency Department (HOSPITAL_COMMUNITY): Payer: Medicare Other

## 2021-05-10 ENCOUNTER — Other Ambulatory Visit: Payer: Self-pay

## 2021-05-10 DIAGNOSIS — Z8249 Family history of ischemic heart disease and other diseases of the circulatory system: Secondary | ICD-10-CM

## 2021-05-10 DIAGNOSIS — Z20822 Contact with and (suspected) exposure to covid-19: Secondary | ICD-10-CM | POA: Diagnosis not present

## 2021-05-10 DIAGNOSIS — R0902 Hypoxemia: Secondary | ICD-10-CM | POA: Diagnosis not present

## 2021-05-10 DIAGNOSIS — Z7901 Long term (current) use of anticoagulants: Secondary | ICD-10-CM

## 2021-05-10 DIAGNOSIS — Z681 Body mass index (BMI) 19 or less, adult: Secondary | ICD-10-CM | POA: Diagnosis not present

## 2021-05-10 DIAGNOSIS — I5033 Acute on chronic diastolic (congestive) heart failure: Secondary | ICD-10-CM | POA: Diagnosis present

## 2021-05-10 DIAGNOSIS — I110 Hypertensive heart disease with heart failure: Principal | ICD-10-CM | POA: Diagnosis present

## 2021-05-10 DIAGNOSIS — Z923 Personal history of irradiation: Secondary | ICD-10-CM

## 2021-05-10 DIAGNOSIS — J9621 Acute and chronic respiratory failure with hypoxia: Secondary | ICD-10-CM | POA: Diagnosis not present

## 2021-05-10 DIAGNOSIS — F419 Anxiety disorder, unspecified: Secondary | ICD-10-CM | POA: Diagnosis present

## 2021-05-10 DIAGNOSIS — I10 Essential (primary) hypertension: Secondary | ICD-10-CM | POA: Diagnosis not present

## 2021-05-10 DIAGNOSIS — M6284 Sarcopenia: Secondary | ICD-10-CM | POA: Diagnosis present

## 2021-05-10 DIAGNOSIS — I2782 Chronic pulmonary embolism: Secondary | ICD-10-CM | POA: Diagnosis present

## 2021-05-10 DIAGNOSIS — R911 Solitary pulmonary nodule: Secondary | ICD-10-CM | POA: Diagnosis not present

## 2021-05-10 DIAGNOSIS — G9341 Metabolic encephalopathy: Secondary | ICD-10-CM | POA: Diagnosis not present

## 2021-05-10 DIAGNOSIS — E871 Hypo-osmolality and hyponatremia: Secondary | ICD-10-CM | POA: Diagnosis present

## 2021-05-10 DIAGNOSIS — J9622 Acute and chronic respiratory failure with hypercapnia: Secondary | ICD-10-CM | POA: Diagnosis present

## 2021-05-10 DIAGNOSIS — J441 Chronic obstructive pulmonary disease with (acute) exacerbation: Secondary | ICD-10-CM | POA: Diagnosis not present

## 2021-05-10 DIAGNOSIS — F17210 Nicotine dependence, cigarettes, uncomplicated: Secondary | ICD-10-CM | POA: Diagnosis not present

## 2021-05-10 DIAGNOSIS — I2699 Other pulmonary embolism without acute cor pulmonale: Secondary | ICD-10-CM | POA: Diagnosis not present

## 2021-05-10 DIAGNOSIS — Z888 Allergy status to other drugs, medicaments and biological substances status: Secondary | ICD-10-CM

## 2021-05-10 DIAGNOSIS — R6889 Other general symptoms and signs: Secondary | ICD-10-CM | POA: Diagnosis not present

## 2021-05-10 DIAGNOSIS — R627 Adult failure to thrive: Secondary | ICD-10-CM | POA: Diagnosis present

## 2021-05-10 DIAGNOSIS — I2609 Other pulmonary embolism with acute cor pulmonale: Secondary | ICD-10-CM | POA: Diagnosis not present

## 2021-05-10 DIAGNOSIS — R069 Unspecified abnormalities of breathing: Secondary | ICD-10-CM | POA: Diagnosis not present

## 2021-05-10 DIAGNOSIS — I2729 Other secondary pulmonary hypertension: Secondary | ICD-10-CM | POA: Diagnosis present

## 2021-05-10 DIAGNOSIS — I739 Peripheral vascular disease, unspecified: Secondary | ICD-10-CM | POA: Diagnosis not present

## 2021-05-10 DIAGNOSIS — Z66 Do not resuscitate: Secondary | ICD-10-CM | POA: Diagnosis not present

## 2021-05-10 DIAGNOSIS — R0602 Shortness of breath: Secondary | ICD-10-CM

## 2021-05-10 DIAGNOSIS — R531 Weakness: Secondary | ICD-10-CM | POA: Diagnosis not present

## 2021-05-10 DIAGNOSIS — Z743 Need for continuous supervision: Secondary | ICD-10-CM | POA: Diagnosis not present

## 2021-05-10 DIAGNOSIS — R410 Disorientation, unspecified: Secondary | ICD-10-CM | POA: Diagnosis not present

## 2021-05-10 DIAGNOSIS — Z825 Family history of asthma and other chronic lower respiratory diseases: Secondary | ICD-10-CM

## 2021-05-10 DIAGNOSIS — C3490 Malignant neoplasm of unspecified part of unspecified bronchus or lung: Secondary | ICD-10-CM | POA: Diagnosis not present

## 2021-05-10 DIAGNOSIS — J449 Chronic obstructive pulmonary disease, unspecified: Secondary | ICD-10-CM | POA: Diagnosis present

## 2021-05-10 DIAGNOSIS — J9601 Acute respiratory failure with hypoxia: Secondary | ICD-10-CM | POA: Diagnosis not present

## 2021-05-10 DIAGNOSIS — F32A Depression, unspecified: Secondary | ICD-10-CM | POA: Diagnosis present

## 2021-05-10 DIAGNOSIS — E876 Hypokalemia: Secondary | ICD-10-CM | POA: Diagnosis not present

## 2021-05-10 DIAGNOSIS — I2781 Cor pulmonale (chronic): Secondary | ICD-10-CM | POA: Diagnosis present

## 2021-05-10 DIAGNOSIS — C3431 Malignant neoplasm of lower lobe, right bronchus or lung: Secondary | ICD-10-CM | POA: Diagnosis not present

## 2021-05-10 DIAGNOSIS — E43 Unspecified severe protein-calorie malnutrition: Secondary | ICD-10-CM | POA: Diagnosis present

## 2021-05-10 DIAGNOSIS — Z79899 Other long term (current) drug therapy: Secondary | ICD-10-CM

## 2021-05-10 DIAGNOSIS — D638 Anemia in other chronic diseases classified elsewhere: Secondary | ICD-10-CM | POA: Diagnosis present

## 2021-05-10 DIAGNOSIS — R64 Cachexia: Secondary | ICD-10-CM | POA: Diagnosis present

## 2021-05-10 DIAGNOSIS — R5381 Other malaise: Secondary | ICD-10-CM | POA: Diagnosis present

## 2021-05-10 DIAGNOSIS — C3412 Malignant neoplasm of upper lobe, left bronchus or lung: Secondary | ICD-10-CM | POA: Diagnosis not present

## 2021-05-10 DIAGNOSIS — Z833 Family history of diabetes mellitus: Secondary | ICD-10-CM

## 2021-05-10 DIAGNOSIS — J9811 Atelectasis: Secondary | ICD-10-CM | POA: Diagnosis not present

## 2021-05-10 DIAGNOSIS — J9602 Acute respiratory failure with hypercapnia: Secondary | ICD-10-CM | POA: Diagnosis not present

## 2021-05-10 LAB — COMPREHENSIVE METABOLIC PANEL
ALT: 38 U/L (ref 0–44)
AST: 51 U/L — ABNORMAL HIGH (ref 15–41)
Albumin: 3.5 g/dL (ref 3.5–5.0)
Alkaline Phosphatase: 101 U/L (ref 38–126)
Anion gap: 10 (ref 5–15)
BUN: 39 mg/dL — ABNORMAL HIGH (ref 8–23)
CO2: 28 mmol/L (ref 22–32)
Calcium: 9.1 mg/dL (ref 8.9–10.3)
Chloride: 87 mmol/L — ABNORMAL LOW (ref 98–111)
Creatinine, Ser: 1.09 mg/dL (ref 0.61–1.24)
GFR, Estimated: >60 mL/min (ref >60)
Glucose, Bld: 130 mg/dL — ABNORMAL HIGH (ref 70–99)
Potassium: 4.5 mmol/L (ref 3.5–5.1)
Sodium: 125 mmol/L — ABNORMAL LOW (ref 135–145)
Total Bilirubin: 0.5 mg/dL (ref 0.3–1.2)
Total Protein: 7.8 g/dL (ref 6.5–8.1)

## 2021-05-10 LAB — CBC WITH DIFFERENTIAL/PLATELET
Abs Immature Granulocytes: 0.04 10*3/uL (ref 0.00–0.07)
Basophils Absolute: 0 10*3/uL (ref 0.0–0.1)
Basophils Relative: 1 %
Eosinophils Absolute: 0 10*3/uL (ref 0.0–0.5)
Eosinophils Relative: 0 %
HCT: 33.2 % — ABNORMAL LOW (ref 39.0–52.0)
Hemoglobin: 11.1 g/dL — ABNORMAL LOW (ref 13.0–17.0)
Immature Granulocytes: 1 %
Lymphocytes Relative: 8 %
Lymphs Abs: 0.4 10*3/uL — ABNORMAL LOW (ref 0.7–4.0)
MCH: 31.6 pg (ref 26.0–34.0)
MCHC: 33.4 g/dL (ref 30.0–36.0)
MCV: 94.6 fL (ref 80.0–100.0)
Monocytes Absolute: 0.9 10*3/uL (ref 0.1–1.0)
Monocytes Relative: 15 %
Neutro Abs: 4.4 10*3/uL (ref 1.7–7.7)
Neutrophils Relative %: 75 %
Platelets: 279 10*3/uL (ref 150–400)
RBC: 3.51 MIL/uL — ABNORMAL LOW (ref 4.22–5.81)
RDW: 13.9 % (ref 11.5–15.5)
WBC: 5.9 10*3/uL (ref 4.0–10.5)
nRBC: 0 % (ref 0.0–0.2)

## 2021-05-10 LAB — BLOOD GAS, ARTERIAL
Acid-base deficit: 0.3 mmol/L (ref 0.0–2.0)
Bicarbonate: 27.4 mmol/L (ref 20.0–28.0)
O2 Saturation: 93.1 %
Patient temperature: 98.6
pCO2 arterial: 64.2 mmHg — ABNORMAL HIGH (ref 32.0–48.0)
pH, Arterial: 7.254 — ABNORMAL LOW (ref 7.350–7.450)
pO2, Arterial: 76.6 mmHg — ABNORMAL LOW (ref 83.0–108.0)

## 2021-05-10 LAB — RESP PANEL BY RT-PCR (FLU A&B, COVID) ARPGX2
Influenza A by PCR: NEGATIVE
Influenza B by PCR: NEGATIVE
SARS Coronavirus 2 by RT PCR: NEGATIVE

## 2021-05-10 LAB — TROPONIN I (HIGH SENSITIVITY): Troponin I (High Sensitivity): 12 ng/L (ref <18)

## 2021-05-10 LAB — BRAIN NATRIURETIC PEPTIDE: B Natriuretic Peptide: 1941.1 pg/mL — ABNORMAL HIGH (ref 0.0–100.0)

## 2021-05-10 LAB — LACTIC ACID, PLASMA: Lactic Acid, Venous: 0.9 mmol/L (ref 0.5–1.9)

## 2021-05-10 LAB — LIPASE, BLOOD: Lipase: 25 U/L (ref 11–51)

## 2021-05-10 MED ORDER — FUROSEMIDE 10 MG/ML IJ SOLN
40.0000 mg | Freq: Once | INTRAMUSCULAR | Status: AC
  Administered 2021-05-11: 40 mg via INTRAVENOUS
  Filled 2021-05-10: qty 4

## 2021-05-10 MED ORDER — HEPARIN (PORCINE) 25000 UT/250ML-% IV SOLN
1000.0000 [IU]/h | INTRAVENOUS | Status: AC
  Administered 2021-05-11: 650 [IU]/h via INTRAVENOUS
  Administered 2021-05-12 – 2021-05-13 (×2): 1000 [IU]/h via INTRAVENOUS
  Filled 2021-05-10 (×3): qty 250

## 2021-05-10 MED ORDER — HEPARIN BOLUS VIA INFUSION
2000.0000 [IU] | Freq: Once | INTRAVENOUS | Status: AC
  Administered 2021-05-11: 2000 [IU] via INTRAVENOUS
  Filled 2021-05-10: qty 2000

## 2021-05-10 MED ORDER — IOHEXOL 350 MG/ML SOLN
75.0000 mL | Freq: Once | INTRAVENOUS | Status: AC | PRN
  Administered 2021-05-10: 75 mL via INTRAVENOUS

## 2021-05-10 NOTE — ED Triage Notes (Signed)
PAtient arrives from home via [place]. Patient's significant other states lack of appetite and fatigue. Called patient's doctor, who prescribed him cough syrup and if he didn't feel better to call 911. Significant other noted confusion and called 911. 60s RA on arrival. Patient currently on 4L [place] at 97-100%. [place] all over. Hx COPD and lung CA. Supposed to start radiation this week. Patient not complaining of SOB, but states productive cough with green sputum, some confusion still noted by EMS.   130/60 70s 18-20  157 CBG 20g LAC

## 2021-05-10 NOTE — ED Provider Notes (Signed)
[facility] DEPT Provider Note   CSN: [id number] Arrival date & time: 05/10/21  1932     History Chief Complaint  Patient presents with   Shortness of Breath    [name] is a 70 y.o. male.  The history is provided by the patient (ems report to nursing). No language interpreter was used.  Shortness of Breath Severity:  Severe Onset quality:  Gradual Duration:  2 days Timing:  Constant Progression:  Waxing and waning Chronicity:  New Context: URI   Relieved by:  Nothing Worsened by:  Nothing Ineffective treatments:  None tried Associated symptoms: chest pain, cough, sputum production and vomiting   Associated symptoms: no abdominal pain, no headaches, no neck pain and no wheezing  Fever: chills reported. Risk factors: hx of cancer       Past Medical History:  Diagnosis Date   Allergies    being around corn & grain causes itchy eyes, nasal congestion. He can eat these with no problems.   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/23/17, s/p SBRT   Pancreatitis    x2 stents placed to make patent duct to pancreas   Pulmonary hypertension (HCC)    PASP 48 mmHg 03/26/18 (echo, [facility])   Severe claudication ([place])    Status post left foot surgery    Tobacco abuse     Patient Active Problem List   Diagnosis Date Noted   Lung nodules    Nodule of lower lobe of left lung 01/22/2021   Diverticulosis 01/31/2019   Memory loss 10/11/2018   Abnormal brain MRI 10/11/2018   Alcoholism ([place]) 07/26/2018   On total parenteral nutrition (TPN)    Inguinal hernia of left side with obstruction and without gangrene    Acute hypoxemic respiratory failure (HCC)    Acute metabolic  encephalopathy    SBO (small bowel obstruction) (HCC)    PAD (peripheral artery disease) ([place])    Incarcerated left inguinal hernia s/p repair 06/18/2018 06/14/2018   Severe claudication ([place]) 03/28/2018   Primary cancer of right upper lobe of lung ([place]) 11/30/2017   Internal hemorrhoids 09/17/2017   Hyponatremia 01/05/2015   Hypokalemia 01/05/2015   COPD (chronic obstructive pulmonary disease) ([place]) 01/05/2015   Essential hypertension 01/05/2015   Protein-calorie malnutrition, severe ([place]) 01/05/2015   Pneumothorax of left lung after biopsy 01/04/2015   Former smoker 01/04/2015   Pneumothorax, traumatic    Cancer of upper lobe of left lung ([place]) 12/21/2014   Pancreatic mass 11/23/2014    Past Surgical History:  Procedure Laterality Date   ABDOMINAL AORTOGRAM W/LOWER EXTREMITY N/A 03/30/2018   Procedure: ABDOMINAL AORTOGRAM W/LOWER EXTREMITY;  Surgeon: Nigel Mormon, MD;  Location: [facility];  Service: Cardiovascular;  Laterality: N/A;   BRONCHIAL BIOPSY  04/23/2021   Procedure: BRONCHIAL BIOPSIES;  Surgeon: Garner Nash, DO;  Location: [facility];  Service: Pulmonary;;   BRONCHIAL BRUSHINGS  04/23/2021   Procedure: BRONCHIAL BRUSHINGS;  Surgeon: Garner Nash, DO;  Location: [facility];  Service: Pulmonary;;   BRONCHIAL NEEDLE ASPIRATION BIOPSY  04/23/2021   Procedure: BRONCHIAL NEEDLE ASPIRATION BIOPSIES;  Surgeon: Garner Nash, DO;  Location: [facility];  Service: Pulmonary;;   COLECTOMY  COLOSTOMY     COLOSTOMY REVERSAL     EUS N/A 12/14/2014   Procedure: UPPER ENDOSCOPIC ULTRASOUND (EUS) LINEAR;  Surgeon: Milus Banister, MD;  Location: [facility];  Service: Endoscopy;  Laterality: N/A;   FIDUCIAL MARKER PLACEMENT  04/23/2021   Procedure: FIDUCIAL MARKER PLACEMENT;  Surgeon: Garner Nash, DO;  Location: [facility];  Service: Pulmonary;;   FOOT SURGERY Left 2006   INGUINAL HERNIA REPAIR Left 06/18/2018   Procedure: OPEN RECURRENT LEFT  INGUINAL HERNIA REPAIR WITH MESH;  Surgeon: Armandina Gemma, MD;  Location: [facility];  Service: General;  Laterality: Left;   INSERTION OF MESH Left 06/18/2018   Procedure: INSERTION OF MESH;  Surgeon: Armandina Gemma, MD;  Location: [facility];  Service: General;  Laterality: Left;   LAPAROTOMY N/A 06/30/2018   Procedure: EXPLORATORY LAPAROTOMY LYSISIS OF ADHESIONS;  Surgeon: Jovita Kussmaul, MD;  Location: [facility];  Service: General;  Laterality: N/A;   PANCREAS SURGERY  2007   PERIPHERAL VASCULAR INTERVENTION  03/30/2018   Procedure: PERIPHERAL VASCULAR INTERVENTION;  Surgeon: Nigel Mormon, MD;  Location: [facility];  Service: Cardiovascular;;  Rt Iliac   UPPER GI ENDOSCOPY     VENTRAL HERNIA REPAIR  1967   VIDEO BRONCHOSCOPY WITH RADIAL ENDOBRONCHIAL ULTRASOUND  04/23/2021   Procedure: VIDEO BRONCHOSCOPY WITH RADIAL ENDOBRONCHIAL ULTRASOUND;  Surgeon: Garner Nash, DO;  Location: [facility];  Service: Pulmonary;;       Family History  Problem Relation Age of Onset   COPD Mother    CAD Mother    Diabetes Father    Cancer Brother        metastatic, unknown primary   Cancer Paternal Grandmother     Social History   Tobacco Use   Smoking status: Every Day    Packs/day: 1.00    Years: 50.00    Pack years: 50.00    Types: Cigarettes   Smokeless tobacco: Never   Tobacco comments:    smoking 0.5 ppd as of 10/07/2018    Now 1/4 ppd as of 04/22/21  Vaping Use   Vaping Use: Former  Substance Use Topics   Alcohol use: Yes    Alcohol/week: 6.0 standard drinks    Types: 6 Cans of beer per week   Drug use: Yes    Types: Marijuana    Comment: Last use 04/20/21    Home Medications Prior to Admission medications   Medication Sig Start Date End Date Taking? Authorizing Provider  acetaminophen (TYLENOL) 500 MG tablet Take 500 mg by mouth every 6 (six) hours as needed for mild pain.    [provider]  albuterol (PROVENTIL) (2.5 MG/3ML) 0.083% nebulizer solution Take  3 mLs (2.5 mg total) by nebulization every 2 (two) hours as needed for wheezing or shortness of breath. 07/12/18   Meuth, Brooke A, PA-C  albuterol (VENTOLIN HFA) 108 (90 Base) MCG/ACT inhaler Inhale 1-2 puffs into the lungs every 6 (six) hours as needed for shortness of breath. 01/14/21   [provider]  amLODipine (NORVASC) 10 MG tablet Take 10 mg by mouth every morning.  11/07/14   [provider]  Budeson-Glycopyrrol-Formoterol (BREZTRI AEROSPHERE) 160-9-4.8 MCG/ACT AERO Inhale 2 puffs into the lungs 2 (two) times daily. 04/29/21   Magdalen Spatz, NP  buPROPion (WELLBUTRIN XL) 150 MG 24 hr tablet Take 150 mg by mouth every morning.  10/30/14   [provider]  fluticasone (FLONASE) 50 MCG/ACT nasal spray Place 2 sprays into both nostrils  daily as needed for allergies. 08/27/18   [provider]  ipratropium-albuterol (DUONEB) 0.5-2.5 (3) MG/3ML SOLN Take 3 mLs by nebulization 3 (three) times daily. 07/26/18   Lauraine Rinne, NP  lactose free nutrition (BOOST) LIQD Take 237 mLs by mouth 3 (three) times daily between meals.    [provider]  metoprolol succinate (TOPROL-XL) 25 MG 24 hr tablet Take 25 mg by mouth daily. 01/14/21   [provider]  Multiple Vitamin (MULTIVITAMIN WITH MINERALS) TABS tablet Take 1 tablet by mouth every morning.    [provider]  Nutritional Supplements (CARNATION BREAKFAST ESSENTIALS PO) Take 1 packet by mouth 2 (two) times daily.    [provider]    Allergies    Chantix [varenicline], Prevacid [lansoprazole], and Ciprofloxacin  Review of Systems   Review of Systems  Constitutional:  Positive for chills and fatigue. Fever: chills reported. HENT:  Negative for congestion.   Eyes:  Negative for visual disturbance.  Respiratory:  Positive for cough, sputum production, chest tightness and shortness of breath. Negative for wheezing and stridor.   Cardiovascular:  Positive for chest pain. Negative for  palpitations and leg swelling.  Gastrointestinal:  Positive for nausea and vomiting. Negative for abdominal pain, constipation and diarrhea.  Genitourinary:  Negative for dysuria and flank pain.  Musculoskeletal:  Negative for back pain, neck pain and neck stiffness.  Skin:  Negative for wound.  Neurological:  Negative for headaches.  Psychiatric/Behavioral:  Negative for agitation.   All other systems reviewed and are negative.  Physical Exam Updated Vital Signs BP (!) 147/73 (BP Location: Right Arm)   Pulse 81   Temp 97.9 F (36.6 C) (Oral)   Resp (!) 25   Ht 5\' 2"  (1.575 m)   Wt 38.5 kg   SpO2 (!) 89%   BMI 15.52 kg/m   Physical Exam Vitals and nursing note reviewed.  Constitutional:      General: He is not in acute distress.    Appearance: He is well-developed. He is not ill-appearing, toxic-appearing or diaphoretic.  HENT:     Head: Normocephalic and atraumatic.  Eyes:     Conjunctiva/sclera: Conjunctivae normal.     Pupils: Pupils are equal, round, and reactive to light.  Cardiovascular:     Rate and Rhythm: Regular rhythm. Tachycardia present.     Heart sounds: No murmur heard. Pulmonary:     Effort: Pulmonary effort is normal. Tachypnea present. No respiratory distress.     Breath sounds: Rhonchi present. No decreased breath sounds, wheezing or rales.  Chest:     Chest wall: No tenderness.  Abdominal:     Palpations: Abdomen is soft.     Tenderness: There is no abdominal tenderness.  Musculoskeletal:        General: No swelling.     Cervical back: Neck supple.     Right lower leg: No tenderness. No edema.     Left lower leg: No tenderness. No edema.  Skin:    General: Skin is warm and dry.     Capillary Refill: Capillary refill takes less than 2 seconds.     Findings: No erythema.  Neurological:     General: No focal deficit present.     Mental Status: He is alert.  Psychiatric:        Mood and Affect: Mood normal.    ED Results / Procedures /  Treatments   Labs (all labs ordered are listed, but only abnormal results are displayed) Labs Reviewed  CBC WITH DIFFERENTIAL/PLATELET - Abnormal; Notable for the following components:      Result Value   RBC 3.51 (*)    Hemoglobin 11.1 (*)    HCT 33.2 (*)    Lymphs Abs 0.4 (*)    All other components within normal limits  COMPREHENSIVE METABOLIC PANEL - Abnormal; Notable for the following components:   Sodium 125 (*)    Chloride 87 (*)    Glucose, Bld 130 (*)    BUN 39 (*)    AST 51 (*)    All other components within normal limits  BRAIN NATRIURETIC PEPTIDE - Abnormal; Notable for the following components:   B Natriuretic Peptide 1,941.1 (*)    All other components within normal limits  BLOOD GAS, ARTERIAL - Abnormal; Notable for the following components:   pH, Arterial 7.254 (*)    pCO2 arterial 64.2 (*)    pO2, Arterial 76.6 (*)    All other components within normal limits  RESP PANEL BY RT-PCR (FLU A&B, COVID) ARPGX2  CULTURE, BLOOD (ROUTINE X 2)  CULTURE, BLOOD (ROUTINE X 2)  LACTIC ACID, PLASMA  LIPASE, BLOOD  LACTIC ACID, PLASMA  APTT  PROTIME-INR  TROPONIN I (HIGH SENSITIVITY)  TROPONIN I (HIGH SENSITIVITY)    EKG EKG Interpretation  Date/Time:  Friday May 10 2021 20:06:29 EST Ventricular Rate:  73 PR Interval:  156 QRS Duration: 107 QT Interval:  402 QTC Calculation: 443 R Axis:   72 Text Interpretation: Sinus rhythm Consider right atrial enlargement Anteroseptal infarct, age indeterminate Artifact in lead(s) I II III aVR aVL aVF V1 V2 V3 V5 V6 and baseline wander in lead(s) V2 When compared to prior, similar appearance to prior. No STEMI Confirmed by Antony Blackbird [id number]) on 05/10/2021 8:22:01 PM  Radiology DG Chest 2 View  Result Date: 05/10/2021 CLINICAL DATA:  Shortness of breath EXAM: CHEST - 2 VIEW COMPARISON:  04/23/2021 FINDINGS: Cardiac shadow is within normal limits. Aortic calcifications are noted. Mild vascular congestion is noted with  minimal edema. Lungs are hyperinflated. Stable fiducial marker is noted in the left lower lung. No focal infiltrate is seen. IMPRESSION: Mild interstitial edema. Electronically Signed   By: Inez Catalina M.D.   On: 05/10/2021 21:35   CT Angio Chest PE W and/or Wo Contrast  Result Date: 05/10/2021 CLINICAL DATA:  Known history of lung carcinoma with productive cough EXAM: CT ANGIOGRAPHY CHEST WITH CONTRAST TECHNIQUE: Multidetector CT imaging of the chest was performed using the standard protocol during bolus administration of intravenous contrast. Multiplanar CT image reconstructions and MIPs were obtained to evaluate the vascular anatomy. CONTRAST:  64mL OMNIPAQUE IOHEXOL 350 MG/ML SOLN COMPARISON:  04/18/2021 FINDINGS: Cardiovascular: Atherosclerotic calcifications of the thoracic aorta are noted without aneurysmal dilatation or dissection. No cardiac enlargement is seen. Coronary calcifications are noted. Pulmonary artery shows a normal branching pattern bilaterally. Tiny filling defect is noted in the medial right lung base best seen on image number 199 of series 5 which appears to represent a more chronic adherent pulmonary embolus. No other filling defect is identified. Mediastinum/Nodes: Thoracic inlet is within normal limits. No sizable hilar or mediastinal adenopathy is noted. Stable small nodes are noted in the AP window. Esophagus is within normal limits. Lungs/Pleura: Significant emphysematous changes are noted similar to that seen on the prior exam. In the right upper lobe along the major fissure best seen on image number 48 of series 6 there is a 9 by 5 mm nodule identified. The overall size is decreased from  the prior examination although the degree of solid component has increased with stable deformity of the major fissure. The nodule in the lateral aspect of the right upper lobe is again seen on image number 67 of series 6 but decreased in size now measuring approximately 8 mm in greatest  dimension. Bronchial wall thickening remains in the right lower lobe with evidence of right basilar atelectasis. Calcified granuloma is noted in the right lower lobe as well. The nodular density in the medial posterior right lower lobe is obscured by the atelectatic changes. In the left upper lobe, the area of previously described post radiation changes is again identified with a significant increase in solid component when compared with the prior exam. This measures approximately 2.2 x 1.6 cm in greatest dimension. This increase in solid component may represent some retraction of the post irradiation changes. The previously seen nodule in the left lower lobe now measures 10 x 5 mm slightly decreased in size when compared with the prior exam. Left basilar atelectasis is noted. Upper Abdomen: Visualized upper abdomen is within normal limits. Musculoskeletal: Degenerative changes of the thoracic spine are noted. No acute rib fracture is seen. Review of the MIP images confirms the above findings. IMPRESSION: Single chronic appearing pulmonary embolus in the right lower lobe pulmonary artery. No other acute appearing pulmonary emboli are noted. Changes in the lungs bilaterally consistent with the patient's known history of lung carcinoma. The previously seen left lower lobe nodule anteriorly has decreased slightly in the interval when compare with the prior exam. Increase in solid component within the right upper lobe nodule along the major fissure. This may represent some focal progression of disease. Attention on follow-up exam is recommended. Increase in solid component within the known left upper lobe changes previously described and is post radiation fibrosis. This may also represent some focal local progression of disease. Aortic Atherosclerosis (ICD10-I70.0) and Emphysema (ICD10-J43.9). Electronically Signed   By: Inez Catalina M.D.   On: 05/10/2021 22:21    Procedures Procedures   CRITICAL CARE Performed  by: Gwenyth Allegra Ayaan Ringle Total critical care time: 40 minutes Critical care time was exclusive of separately billable procedures and treating other patients. Critical care was necessary to treat or prevent imminent or life-threatening deterioration. Critical care was time spent personally by me on the following activities: development of treatment plan with patient and/or surrogate as well as nursing, discussions with consultants, evaluation of patient's response to treatment, examination of patient, obtaining history from patient or surrogate, ordering and performing treatments and interventions, ordering and review of laboratory studies, ordering and review of radiographic studies, pulse oximetry and re-evaluation of patient's condition.   Medications Ordered in ED Medications  furosemide (LASIX) injection 40 mg (has no administration in time range)  iohexol (OMNIPAQUE) 350 MG/ML injection 75 mL (75 mLs Intravenous Contrast Given 05/10/21 2142)    ED Course  I have reviewed the triage vital signs and the nursing notes.  Pertinent labs & imaging results that were available during my care of the patient were reviewed by me and considered in my medical decision making (see chart for details).    MDM Rules/Calculators/A&P                           Arav Bannister is a 70 y.o. 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  Per EMS report to nursing, he was found to have an oxygen saturation of 60% on room air and he does not take oxygen at home.  He is now on 4 L to maintain oxygen saturations in the 90s.  He is reporting some chest pressure and tightness as well as having a productive cough.  He reports subjective chills but no documented fevers at home.  He  reports an episode of nausea and vomiting but denies any abdominal pain and does not report distention.  Denies any constipation, diarrhea, or urinary changes.  On arrival he is found to be tachycardic and tachypneic but is afebrile.  On exam, lungs have coarseness.  Chest and back are nontender.  Abdomen is nontender.  Bowel sounds appreciated.  Patient is not edematous.  Oxygen saturations are now in the 90s on oxygen however patient is somnolent initially.  Arousable with loud speech.  EKG showed a sinus rhythm with no evidence of STEMI.  Clinically I suspect patient either has pneumonia or even a pulmonary embolism given his history of lung cancer, vital signs, and the hypoxia.  Will get a CT PE study as well as labs.  Due to the new oxygen requirement, anticipate admission.  We will get a likely preadmission COVID/flu swab and will also get an ABG given the somnolence.  Anticipate admission for further management of new hypoxia.   11:18 PM Patient's work-up continue to return.  BNP is very elevated at 1900.  His CT scan also shows progression of disease but also a likely chronic pulmonary embolism.  Patient denies any history of DVT or PE so although this appears chronic, it is new to him.  On my reassessment his oxygen saturations are again in the 70s on 6 L nasal cannula.  I suspect he was breathing through his mouth, he was increased to a nonrebreather and oxygen saturations jumped straight into the 90s.  We will order Lasix for the likely fluid overload component and will call medicine for further management of his new hypoxia.  Will put in heparin for the pulmonary embolism and patient will be admitted.   Final Clinical Impression(s) / ED Diagnoses Final diagnoses:  Other pulmonary embolism without acute cor pulmonale, unspecified chronicity (HCC)  Hypoxia  SOB (shortness of breath)    Clinical Impression: 1. Other pulmonary embolism without acute cor pulmonale, unspecified  chronicity ([place])   2. Hypoxia   3. SOB (shortness of breath)     Disposition: Admit  This note was prepared with assistance of Dragon voice recognition software. Occasional wrong-word or sound-a-like substitutions may have occurred due to the inherent limitations of voice recognition software.     Zavion Sleight, Gwenyth Allegra, MD 05/10/21 2350

## 2021-05-10 NOTE — ED Notes (Signed)
Respiratory called for ABG

## 2021-05-10 NOTE — Progress Notes (Signed)
ANTICOAGULATION CONSULT NOTE - Initial Consult  Pharmacy Consult for heparin Indication: pulmonary embolus  Allergies  Allergen Reactions   Prevacid [Lansoprazole] Other (See Comments)    Bloating ("became swollen with gas")   Varenicline Nausea And Vomiting and Other (See Comments)    Vivid, bad dreams also   Ciprofloxacin Other (See Comments)    GI Upset   Mirtazapine Other (See Comments)    Negatively affected sleep   Omeprazole Other (See Comments)    "Gas"    Patient Measurements: Height: 5\' 2"  (157.5 cm) Weight: 38.5 kg (84 lb 14 oz) IBW/kg (Calculated) : 54.6 Heparin Dosing Weight: 38.5kg  Vital Signs: Temp: 97.9 F (36.6 C) (12/02 1955) Temp Source: Oral (12/02 1955) BP: 147/73 (12/02 2241) Pulse Rate: 81 (12/02 2241)  Labs: Recent Labs    05/10/21 2030  HGB 11.1*  HCT 33.2*  PLT 279  CREATININE 1.09  TROPONINIHS 12    Estimated Creatinine Clearance: 34.8 mL/min (by C-G formula based on SCr of 1.09 mg/dL).   Medical History: Past Medical History:  Diagnosis Date   Allergies    being around corn & grain causes itchy eyes, nasal congestion. He can eat these with no problems.   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 male with a past medical history significant for adenocarcinoma of the lung getting ready to start radiation therapy, previous bowel obstruction, previous pneumothorax, COPD but not on any home  oxygen, hypertension who presents with chills, productive cough, and sob.  Pharmacy consulted to dose heparin drip for PE.  No previous AC noted  Hgb 11.1, Plts 279, Scr 1.09  Goal of Therapy:  Heparin level 0.3-0.7 units/ml Monitor platelets by anticoagulation protocol: Yes   Plan:  Baseline labs ordered STAT Heparin bolus 2000 units x 1 then Start heparin drip at 650 units/hr Heparin level in 6 hours Daily CBC   Dolly Rias RPh 05/10/2021, 11:19 PM

## 2021-05-10 NOTE — ED Notes (Signed)
Patient's significant other updated

## 2021-05-10 NOTE — Progress Notes (Signed)
ABG collected and send down to lab for analysis. Lab called.

## 2021-05-10 NOTE — ED Notes (Signed)
Patient transported to X-ray 

## 2021-05-11 ENCOUNTER — Inpatient Hospital Stay (HOSPITAL_COMMUNITY): Payer: Medicare Other

## 2021-05-11 ENCOUNTER — Telehealth: Payer: Self-pay | Admitting: Internal Medicine

## 2021-05-11 ENCOUNTER — Encounter (HOSPITAL_COMMUNITY): Payer: Self-pay | Admitting: Family Medicine

## 2021-05-11 DIAGNOSIS — J9601 Acute respiratory failure with hypoxia: Secondary | ICD-10-CM | POA: Diagnosis not present

## 2021-05-11 DIAGNOSIS — J9602 Acute respiratory failure with hypercapnia: Secondary | ICD-10-CM

## 2021-05-11 DIAGNOSIS — I2699 Other pulmonary embolism without acute cor pulmonale: Secondary | ICD-10-CM | POA: Diagnosis present

## 2021-05-11 DIAGNOSIS — C3490 Malignant neoplasm of unspecified part of unspecified bronchus or lung: Secondary | ICD-10-CM | POA: Diagnosis present

## 2021-05-11 LAB — RAPID URINE DRUG SCREEN, HOSP PERFORMED
Amphetamines: NOT DETECTED
Barbiturates: NOT DETECTED
Benzodiazepines: NOT DETECTED
Cocaine: NOT DETECTED
Opiates: NOT DETECTED
Tetrahydrocannabinol: POSITIVE — AB

## 2021-05-11 LAB — RESPIRATORY PANEL BY PCR

## 2021-05-11 LAB — BLOOD GAS, ARTERIAL
Acid-base deficit: 1.4 mmol/L (ref 0.0–2.0)
Acid-base deficit: 2.6 mmol/L — ABNORMAL HIGH (ref 0.0–2.0)
Bicarbonate: 29 mmol/L — ABNORMAL HIGH (ref 20.0–28.0)
Bicarbonate: 25.3 mmol/L (ref 20.0–28.0)
Drawn by: 22052
FIO2: 100
FIO2: 50
Mode: POSITIVE
O2 Saturation: 99.5 %
O2 Saturation: 98.8 %
Patient temperature: 98.6
Patient temperature: 98.6
RATE: 16 resp/min
pCO2 arterial: 87.5 mmHg (ref 32.0–48.0)
pCO2 arterial: 62.1 mmHg — ABNORMAL HIGH (ref 32.0–48.0)
pH, Arterial: 7.146 — CL (ref 7.350–7.450)
pH, Arterial: 7.234 — ABNORMAL LOW (ref 7.350–7.450)
pO2, Arterial: 277 mmHg — ABNORMAL HIGH (ref 83.0–108.0)
pO2, Arterial: 169 mmHg — ABNORMAL HIGH (ref 83.0–108.0)

## 2021-05-11 LAB — STREP PNEUMONIAE URINARY ANTIGEN: Strep Pneumo Urinary Antigen: NEGATIVE

## 2021-05-11 LAB — ECHOCARDIOGRAM COMPLETE
Area-P 1/2: 4.89 cm2
Height: 62 in
S' Lateral: 2.5 cm
Weight: 1358.03 oz

## 2021-05-11 LAB — HEPATIC FUNCTION PANEL
ALT: 36 U/L (ref 0–44)
AST: 45 U/L — ABNORMAL HIGH (ref 15–41)
Albumin: 3.2 g/dL — ABNORMAL LOW (ref 3.5–5.0)
Alkaline Phosphatase: 108 U/L (ref 38–126)
Bilirubin, Direct: 0.1 mg/dL (ref 0.0–0.2)
Indirect Bilirubin: 0.4 mg/dL (ref 0.3–0.9)
Total Bilirubin: 0.5 mg/dL (ref 0.3–1.2)
Total Protein: 7.2 g/dL (ref 6.5–8.1)

## 2021-05-11 LAB — BASIC METABOLIC PANEL
Anion gap: 12 (ref 5–15)
BUN: 40 mg/dL — ABNORMAL HIGH (ref 8–23)
CO2: 23 mmol/L (ref 22–32)
Calcium: 8.8 mg/dL — ABNORMAL LOW (ref 8.9–10.3)
Chloride: 90 mmol/L — ABNORMAL LOW (ref 98–111)
Creatinine, Ser: 1.17 mg/dL (ref 0.61–1.24)
GFR, Estimated: >60 mL/min (ref >60)
Glucose, Bld: 163 mg/dL — ABNORMAL HIGH (ref 70–99)
Potassium: 4.1 mmol/L (ref 3.5–5.1)
Sodium: 125 mmol/L — ABNORMAL LOW (ref 135–145)

## 2021-05-11 LAB — CBC
HCT: 34.9 % — ABNORMAL LOW (ref 39.0–52.0)
Hemoglobin: 11.3 g/dL — ABNORMAL LOW (ref 13.0–17.0)
MCH: 32.1 pg (ref 26.0–34.0)
MCHC: 32.4 g/dL (ref 30.0–36.0)
MCV: 99.1 fL (ref 80.0–100.0)
Platelets: 260 10*3/uL (ref 150–400)
RBC: 3.52 MIL/uL — ABNORMAL LOW (ref 4.22–5.81)
RDW: 14.3 % (ref 11.5–15.5)
WBC: 5.9 10*3/uL (ref 4.0–10.5)
nRBC: 0 % (ref 0.0–0.2)

## 2021-05-11 LAB — LACTIC ACID, PLASMA: Lactic Acid, Venous: 0.7 mmol/L (ref 0.5–1.9)

## 2021-05-11 LAB — PROTIME-INR
INR: 1.1 (ref 0.8–1.2)
Prothrombin Time: 13.7 seconds (ref 11.4–15.2)

## 2021-05-11 LAB — TSH: TSH: 4.039 u[IU]/mL (ref 0.350–4.500)

## 2021-05-11 LAB — MRSA NEXT GEN BY PCR, NASAL: MRSA by PCR Next Gen: NOT DETECTED

## 2021-05-11 LAB — APTT: aPTT: 32 seconds (ref 24–36)

## 2021-05-11 LAB — HEPARIN LEVEL (UNFRACTIONATED)
Heparin Unfractionated: <0.1 IU/mL — ABNORMAL LOW (ref 0.30–0.70)
Heparin Unfractionated: 0.25 IU/mL — ABNORMAL LOW (ref 0.30–0.70)

## 2021-05-11 LAB — SODIUM, URINE, RANDOM: Sodium, Ur: <10 mmol/L

## 2021-05-11 LAB — HIV ANTIBODY (ROUTINE TESTING W REFLEX): HIV Screen 4th Generation wRfx: NONREACTIVE

## 2021-05-11 LAB — TROPONIN I (HIGH SENSITIVITY): Troponin I (High Sensitivity): 12 ng/L (ref <18)

## 2021-05-11 LAB — AMMONIA: Ammonia: 76 umol/L — ABNORMAL HIGH (ref 9–35)

## 2021-05-11 MED ORDER — SODIUM CHLORIDE 0.9 % IV SOLN: 250.0000 mL | INTRAVENOUS | Status: DC | PRN

## 2021-05-11 MED ORDER — ALBUTEROL SULFATE (2.5 MG/3ML) 0.083% IN NEBU: 2.5000 mg | INHALATION_SOLUTION | RESPIRATORY_TRACT | Status: DC | PRN

## 2021-05-11 MED ORDER — IPRATROPIUM-ALBUTEROL 0.5-2.5 (3) MG/3ML IN SOLN
3.0000 mL | RESPIRATORY_TRACT | Status: DC
  Administered 2021-05-11 – 2021-05-12 (×9): 3 mL via RESPIRATORY_TRACT
  Filled 2021-05-11 (×9): qty 3

## 2021-05-11 MED ORDER — SODIUM CHLORIDE 0.9% FLUSH
3.0000 mL | INTRAVENOUS | Status: DC | PRN
  Administered 2021-05-14: 3 mL via INTRAVENOUS

## 2021-05-11 MED ORDER — HEPARIN BOLUS VIA INFUSION
1200.0000 [IU] | Freq: Once | INTRAVENOUS | Status: AC
Start: 2021-05-11 — End: 2021-05-11
  Administered 2021-05-11: 1200 [IU] via INTRAVENOUS
  Filled 2021-05-11: qty 1200

## 2021-05-11 MED ORDER — CHLORHEXIDINE GLUCONATE 0.12 % MT SOLN
15.0000 mL | Freq: Two times a day (BID) | OROMUCOSAL | Status: DC
  Administered 2021-05-11 – 2021-05-16 (×11): 15 mL via OROMUCOSAL
  Filled 2021-05-11 (×10): qty 15

## 2021-05-11 MED ORDER — SODIUM CHLORIDE 0.9% FLUSH
3.0000 mL | Freq: Two times a day (BID) | INTRAVENOUS | Status: DC
  Administered 2021-05-11 – 2021-05-14 (×2): 3 mL via INTRAVENOUS

## 2021-05-11 MED ORDER — METOPROLOL SUCCINATE ER 25 MG PO TB24: 25.0000 mg | ORAL_TABLET | Freq: Every day | ORAL | Status: DC

## 2021-05-11 MED ORDER — ONDANSETRON HCL 4 MG PO TABS: 4.0000 mg | ORAL_TABLET | Freq: Four times a day (QID) | ORAL | Status: DC | PRN

## 2021-05-11 MED ORDER — SODIUM CHLORIDE 0.9 % IV SOLN
1.0000 g | INTRAVENOUS | Status: DC
  Administered 2021-05-11 – 2021-05-12 (×2): 1 g via INTRAVENOUS
  Filled 2021-05-11 (×5): qty 10

## 2021-05-11 MED ORDER — CHLORHEXIDINE GLUCONATE CLOTH 2 % EX PADS
6.0000 | MEDICATED_PAD | Freq: Every day | CUTANEOUS | Status: DC
  Administered 2021-05-11 – 2021-05-15 (×3): 6 via TOPICAL

## 2021-05-11 MED ORDER — PREDNISONE 20 MG PO TABS
40.0000 mg | ORAL_TABLET | Freq: Every day | ORAL | Status: AC
  Administered 2021-05-12 – 2021-05-15 (×4): 40 mg via ORAL
  Filled 2021-05-11 (×4): qty 2

## 2021-05-11 MED ORDER — FUROSEMIDE 10 MG/ML IJ SOLN
40.0000 mg | Freq: Once | INTRAMUSCULAR | Status: AC
  Administered 2021-05-11: 40 mg via INTRAVENOUS
  Filled 2021-05-11: qty 4

## 2021-05-11 MED ORDER — IPRATROPIUM-ALBUTEROL 0.5-2.5 (3) MG/3ML IN SOLN
3.0000 mL | Freq: Four times a day (QID) | RESPIRATORY_TRACT | Status: DC
  Administered 2021-05-11 (×2): 3 mL via RESPIRATORY_TRACT
  Filled 2021-05-11 (×2): qty 3

## 2021-05-11 MED ORDER — METHYLPREDNISOLONE SODIUM SUCC 125 MG IJ SOLR
125.0000 mg | Freq: Two times a day (BID) | INTRAMUSCULAR | Status: AC
  Administered 2021-05-11 (×2): 125 mg via INTRAVENOUS
  Filled 2021-05-11: qty 2

## 2021-05-11 MED ORDER — AMLODIPINE BESYLATE 10 MG PO TABS: 10.0000 mg | ORAL_TABLET | Freq: Every morning | ORAL | Status: DC

## 2021-05-11 MED ORDER — BUPROPION HCL ER (XL) 150 MG PO TB24: 150.0000 mg | ORAL_TABLET | Freq: Every morning | ORAL | Status: DC

## 2021-05-11 MED ORDER — SENNOSIDES-DOCUSATE SODIUM 8.6-50 MG PO TABS: 1.0000 | ORAL_TABLET | Freq: Every evening | ORAL | Status: DC | PRN

## 2021-05-11 MED ORDER — MAGNESIUM SULFATE 2 GM/50ML IV SOLN
2.0000 g | Freq: Once | INTRAVENOUS | Status: AC
  Administered 2021-05-11: 2 g via INTRAVENOUS
  Filled 2021-05-11: qty 50

## 2021-05-11 MED ORDER — DEXMEDETOMIDINE HCL IN NACL 200 MCG/50ML IV SOLN
0.4000 ug/kg/h | INTRAVENOUS | Status: DC
  Administered 2021-05-11: 0.4 ug/kg/h via INTRAVENOUS
  Administered 2021-05-11: 0.5 ug/kg/h via INTRAVENOUS
  Administered 2021-05-12: 09:00:00 0.8 ug/kg/h via INTRAVENOUS
  Administered 2021-05-12: 03:00:00 0.7 ug/kg/h via INTRAVENOUS
  Filled 2021-05-11 (×4): qty 50

## 2021-05-11 MED ORDER — LACTULOSE ENEMA
300.0000 mL | Freq: Every day | ORAL | Status: DC
  Administered 2021-05-12: 300 mL via RECTAL
  Filled 2021-05-11 (×7): qty 300

## 2021-05-11 MED ORDER — ORAL CARE MOUTH RINSE
15.0000 mL | Freq: Two times a day (BID) | OROMUCOSAL | Status: DC
  Administered 2021-05-11 – 2021-05-16 (×10): 15 mL via OROMUCOSAL

## 2021-05-11 MED ORDER — ACETAMINOPHEN 325 MG PO TABS: 650.0000 mg | ORAL_TABLET | Freq: Four times a day (QID) | ORAL | Status: DC | PRN

## 2021-05-11 MED ORDER — SODIUM CHLORIDE 0.9% FLUSH
3.0000 mL | Freq: Two times a day (BID) | INTRAVENOUS | Status: DC
  Administered 2021-05-11 – 2021-05-15 (×10): 3 mL via INTRAVENOUS

## 2021-05-11 MED ORDER — ONDANSETRON HCL 4 MG/2ML IJ SOLN: 4.0000 mg | Freq: Four times a day (QID) | INTRAMUSCULAR | Status: DC | PRN

## 2021-05-11 MED ORDER — ACETAMINOPHEN 650 MG RE SUPP: 650.0000 mg | Freq: Four times a day (QID) | RECTAL | Status: DC | PRN

## 2021-05-11 MED ORDER — FAMOTIDINE 20 MG PO TABS: 20.0000 mg | ORAL_TABLET | Freq: Every day | ORAL | Status: DC

## 2021-05-11 NOTE — Progress Notes (Signed)
ANTICOAGULATION CONSULT NOTE   Pharmacy Consult for heparin Indication: pulmonary embolus  Allergies  Allergen Reactions   Prevacid [Lansoprazole] Other (See Comments)    Bloating ("became swollen with gas")   Varenicline Nausea And Vomiting and Other (See Comments)    Vivid, bad dreams also   Ciprofloxacin Other (See Comments)    GI Upset   Mirtazapine Other (See Comments)    Negatively affected sleep   Omeprazole Other (See Comments)    "Gas"   Patient Measurements: Height: 5\' 2"  (157.5 cm) Weight: 38.5 kg (84 lb 14 oz) IBW/kg (Calculated) : 54.6 Heparin Dosing Weight: 38.5kg  Vital Signs: Temp: 97.7 F (36.5 C) (12/03 1537) Temp Source: Axillary (12/03 1537) BP: 129/68 (12/03 1300) Pulse Rate: 65 (12/03 1407)  Labs: Recent Labs    05/10/21 2030 05/11/21 0000 05/11/21 0745 05/11/21 1614  HGB 11.1*  --  11.3*  --   HCT 33.2*  --  34.9*  --   PLT 279  --  260  --   APTT  --  32  --   --   LABPROT  --  13.7  --   --   INR  --  1.1  --   --   HEPARINUNFRC  --   --  <0.10* 0.25*  CREATININE 1.09  --  1.17  --   TROPONINIHS 12 12  --   --     Estimated Creatinine Clearance: 32.4 mL/min (by C-G formula based on SCr of 1.17 mg/dL).  Medical History: Past Medical History:  Diagnosis Date   Allergies    being around corn & grain causes itchy eyes, nasal congestion. He can eat these with no problems.   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  Pharmacy consulted to dose heparin drip for PE.  No previous AC noted  Hgb 11.1, Plts 279, Scr 1.09 Baseline aPTT 32 sec, INR 1.1 Heparin bolus 2000 units x 1 then start heparin drip at 650 units/hr  Goal of Therapy:  Heparin level 0.3-0.7 units/ml Monitor platelets by anticoagulation protocol: Yes  Today, 05/11/2021 1600 HL SUBtherapeutic but improved on current IV heparin rate of 800 units/hr Per RN, no issues or bleeding noted   Plan:  Increase IV heparin rate from 800 to 1000 units/hr Recheck HL in 6 hr after rate increase Daily CBC, plan daily Heparin level at steady state   Adrian Saran, PharmD, BCPS Secure Chat if ?s 05/11/2021 5:16 PM

## 2021-05-11 NOTE — Progress Notes (Signed)
Patient's FIO2 was decreased to 40% and patient is maintaining a saturation of 96%. Patient appears to be tolerating this change and in no distress at this time. RT will continue to monitor

## 2021-05-11 NOTE — Progress Notes (Signed)
  Echocardiogram 2D Echocardiogram has been performed.  [name] 05/11/2021, 12:18 PM

## 2021-05-11 NOTE — Progress Notes (Signed)
Patient unable to tolerate lactulose enema (unable to instill more than 166mls in rectum). MD notified.

## 2021-05-11 NOTE — Progress Notes (Signed)
ANTICOAGULATION CONSULT NOTE   Pharmacy Consult for heparin Indication: pulmonary embolus  Allergies  Allergen Reactions   Prevacid [Lansoprazole] Other (See Comments)    Bloating ("became swollen with gas")   Varenicline Nausea And Vomiting and Other (See Comments)    Vivid, bad dreams also   Ciprofloxacin Other (See Comments)    GI Upset   Mirtazapine Other (See Comments)    Negatively affected sleep   Omeprazole Other (See Comments)    "Gas"   Patient Measurements: Height: 5\' 2"  (157.5 cm) Weight: 38.5 kg (84 lb 14 oz) IBW/kg (Calculated) : 54.6 Heparin Dosing Weight: 38.5kg  Vital Signs: Temp: 98.2 F (36.8 C) (12/03 0400) Temp Source: Axillary (12/03 0400) BP: 136/63 (12/03 0530) Pulse Rate: 77 (12/03 0530)  Labs: Recent Labs    05/10/21 2030 05/11/21 0000  HGB 11.1*  --   HCT 33.2*  --   PLT 279  --   APTT  --  32  LABPROT  --  13.7  INR  --  1.1  CREATININE 1.09  --   TROPONINIHS 12 12    Estimated Creatinine Clearance: 34.8 mL/min (by C-G formula based on SCr of 1.09 mg/dL).  Medical History: Past Medical History:  Diagnosis Date   Allergies    being around corn & grain causes itchy eyes, nasal congestion. He can eat these with no problems.   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 male with a past medical history significant for adenocarcinoma of the lung getting ready to start radiation therapy, previous  bowel obstruction, previous pneumothorax, COPD but not on any home oxygen, hypertension who presents with chills, productive cough, and sob.  Pharmacy consulted to dose heparin drip for PE.  No previous AC noted  Hgb 11.1, Plts 279, Scr 1.09 Baseline aPTT 32 sec, INR 1.1 Heparin bolus 2000 units x 1 then start heparin drip at 650 units/hr  Goal of Therapy:  Heparin level 0.3-0.7 units/ml Monitor platelets by anticoagulation protocol: Yes  Today, 05/11/2021 0745 Hep level < 0.10, per venipuncture Rebolus & incr rate   Plan:  Heparin rebolus 1200 units, increase rate to 800 units/hr Check HL in 6 hr Daily CBC, plan daily Heparin level at steady state  Minda Ditto PharmD [facility] [phone] 05/11/2021, 7:14 AM

## 2021-05-11 NOTE — Progress Notes (Signed)
PROGRESS NOTE    Taygen Acklin  JAS:[id number] DOB: 1951/03/30 DOA: 05/10/2021 PCP: Janie Morning, DO   Brief Narrative: 70 year old male with history of lung CA non-small cell lung cancer in the left upper lobe and right lower lobe diagnosed 05/01/2021, end-stage COPD hypertension PAD anxiety admitted with hypoxia and confusion.  Patient lives at home with his significant other who called EMS as patient was more confused with cough decreased appetite and fatigue.  Patient was saturating in the 60s on room air with EMS.  Work-up in the ED revealed CT angiogram of the chest with single chronic appearing pulmonary embolism and right lower lobe bilateral changes consistent with known lung carcinoma.  His sodium was 125.  BNP was elevated at 1941.  Lactic acid was normal troponin was normal.  Patient was started on IV heparin and Lasix in the ED. ABG 7.2 3/62/169 from 7.1 4/87/277  Assessment & Plan:   Principal Problem:   Acute respiratory failure with hypoxia and hypercarbia (HCC) Active Problems:   Hyponatremia   COPD with acute exacerbation (HCC)   Essential hypertension   PAD (peripheral artery disease) (HCC)   Acute metabolic encephalopathy   Pulmonary embolism (HCC)   Lung cancer (HCC)   #1 acute on chronic hypoxic/hypercapnic respiratory failure secondary to acute COPD exacerbation in the setting of lung cancer and severe COPD, CHF with elevated BNP and chronic PE.  Patient does not have oxygen at home. Appreciate PCCM input.  Repeat VBG better.  Continue BiPAP.  Follow-up echocardiogram Lasix, continue Lasix Continue IV Solu-Medrol and nebulizer treatments Continue IV heparin  #2  CHF with elevated BNP follow-up echocardiogram and continue Lasix.cxr interstitial edema   #3 pulmonary embolism appears chronic in the setting of malignancy continue IV heparin  #4 acute metabolic encephalopathy multifactorial secondary to hypoxia hypercapnia elevated ammonia Will hold off on head  CT till his breathing improves.   #5 history of lung CA has outpatient follow-up with Dr. Tammi Klippel for radiation to new lesions.  #6  Hyper volemic hyponatremia serum sodium remains at 125.    #7 polysubstance abuse check urine drug screen  #8 elevated ammonia lactulose rectally  #9 history of essential hypertension was on Norvasc and Toprol at home.  Will treat with IV Lopressor as needed while on BiPAP.  #10 goals of care-called daughter multiple times with no answer..with multiple comorbidities and poor functional status his prognosis is guarded.  Estimated body mass index is 15.52 kg/m as calculated from the following:   Height as of this encounter: 5\' 2"  (1.575 m).   Weight as of this encounter: 38.5 kg.  DVT prophylaxis:heparin Code Status:full Family Communication: dw significant other/unable to reach daughter multiple attempts Disposition Plan:  Status is: Inpatient  Remains inpatient appropriate because: bipap resp failure   Consultants:  pccm  Procedures:none Antimicrobials: none Subjective: On bipap restless trying to pull all lines  Bladder scan with over 700 cc   Objective: Vitals:   05/11/21 0530 05/11/21 0734 05/11/21 0736 05/11/21 0844  BP: 136/63     Pulse: 77  91   Resp: 17  (!) 21   Temp:    97.7 F (36.5 C)  TempSrc:    Axillary  SpO2: 100% 98% 98%   Weight:      Height:        Intake/Output Summary (Last 24 hours) at 05/11/2021 0939 Last data filed at 05/11/2021 0800 Gross per 24 hour  Intake 170.58 ml  Output 50 ml  Net 120.58 ml  Filed Weights   05/10/21 1956  Weight: 38.5 kg    Examination:  General exam: Appears restless and uncomfortable  Appears frail chronically ill Respiratory system: Decreased breath sounds on to auscultation. Respiratory effort normal. Cardiovascular system: S1 & S2 heard, RRR. No JVD, murmurs, rubs, gallops or clicks. No pedal edema. Gastrointestinal system: Abdomen is nondistended, soft and nontender.  No organomegaly or masses felt. Normal bowel sounds heard. Central nervous system:confused agitated disoriented  Extremities: Symmetric 5 x 5 power. Skin: No rashes, lesions or ulcers Psychiatry:unable to assess   Data Reviewed: I have personally reviewed following labs and imaging studies  CBC: Recent Labs  Lab 05/10/21 2030 05/11/21 0745  WBC 5.9 5.9  NEUTROABS 4.4  --   HGB 11.1* 11.3*  HCT 33.2* 34.9*  MCV 94.6 99.1  PLT 279 329   Basic Metabolic Panel: Recent Labs  Lab 05/10/21 2030  NA 125*  K 4.5  CL 87*  CO2 28  GLUCOSE 130*  BUN 39*  CREATININE 1.09  CALCIUM 9.1   GFR: Estimated Creatinine Clearance: 34.8 mL/min (by C-G formula based on SCr of 1.09 mg/dL). Liver Function Tests: Recent Labs  Lab 05/10/21 2030  AST 51*  ALT 38  ALKPHOS 101  BILITOT 0.5  PROT 7.8  ALBUMIN 3.5   Recent Labs  Lab 05/10/21 2030  LIPASE 25   No results for input(s): AMMONIA in the last 168 hours. Coagulation Profile: Recent Labs  Lab 05/11/21 0000  INR 1.1   Cardiac Enzymes: No results for input(s): CKTOTAL, CKMB, CKMBINDEX, TROPONINI in the last 168 hours. BNP (last 3 results) No results for input(s): PROBNP in the last 8760 hours. HbA1C: No results for input(s): HGBA1C in the last 72 hours. CBG: No results for input(s): GLUCAP in the last 168 hours. Lipid Profile: No results for input(s): CHOL, HDL, LDLCALC, TRIG, CHOLHDL, LDLDIRECT in the last 72 hours. Thyroid Function Tests: No results for input(s): TSH, T4TOTAL, FREET4, T3FREE, THYROIDAB in the last 72 hours. Anemia Panel: No results for input(s): VITAMINB12, FOLATE, FERRITIN, TIBC, IRON, RETICCTPCT in the last 72 hours. Sepsis Labs: Recent Labs  Lab 05/10/21 2030 05/11/21 0000  LATICACIDVEN 0.9 0.7    Recent Results (from the past 240 hour(s))  Resp Panel by RT-PCR (Flu A&B, Covid)     Status: None   Collection Time: 05/10/21  8:30 PM  Result Value Ref Range Status   SARS Coronavirus 2 by  RT PCR NEGATIVE NEGATIVE Final    Comment: (NOTE) SARS-CoV-2 target nucleic acids are NOT DETECTED.  The SARS-CoV-2 RNA is generally detectable in upper respiratory specimens during the acute phase of infection. The lowest concentration of SARS-CoV-2 viral copies this assay can detect is 138 copies/mL. A negative result does not preclude SARS-Cov-2 infection and should not be used as the sole basis for treatment or other patient management decisions. A negative result may occur with  improper specimen collection/handling, submission of specimen other than nasopharyngeal swab, presence of viral mutation(s) within the areas targeted by this assay, and inadequate number of viral copies(<138 copies/mL). A negative result must be combined with clinical observations, patient history, and epidemiological information. The expected result is Negative.  Fact Sheet for Patients:  EntrepreneurPulse.com.au  Fact Sheet for Healthcare Providers:  IncredibleEmployment.be  This test is no t yet approved or cleared by the [place] FDA and  has been authorized for detection and/or diagnosis of SARS-CoV-2 by FDA under an Emergency Use Authorization (EUA). This EUA will remain  in effect (  meaning this test can be used) for the duration of the COVID-19 declaration under Section 564(b)(1) of the Act, 21 U.S.C.section 360bbb-3(b)(1), unless the authorization is terminated  or revoked sooner.       Influenza A by PCR NEGATIVE NEGATIVE Final   Influenza B by PCR NEGATIVE NEGATIVE Final    Comment: (NOTE) The Xpert Xpress SARS-CoV-2/FLU/RSV plus assay is intended as an aid in the diagnosis of influenza from Nasopharyngeal swab specimens and should not be used as a sole basis for treatment. Nasal washings and aspirates are unacceptable for Xpert Xpress SARS-CoV-2/FLU/RSV testing.  Fact Sheet for Patients: EntrepreneurPulse.com.au  Fact Sheet  for Healthcare Providers: IncredibleEmployment.be  This test is not yet approved or cleared by the [place] FDA and has been authorized for detection and/or diagnosis of SARS-CoV-2 by FDA under an Emergency Use Authorization (EUA). This EUA will remain in effect (meaning this test can be used) for the duration of the COVID-19 declaration under Section 564(b)(1) of the Act, 21 U.S.C. section 360bbb-3(b)(1), unless the authorization is terminated or revoked.  Performed at [facility], [place] [address] [place], [place] [address]   MRSA Next Gen by PCR, Nasal     Status: None   Collection Time: 05/11/21  1:53 AM   Specimen: Nasal Mucosa; Nasal Swab  Result Value Ref Range Status   MRSA by PCR Next Gen NOT DETECTED NOT DETECTED Final    Comment: (NOTE) The GeneXpert MRSA Assay (FDA approved for NASAL specimens only), is one component of a comprehensive MRSA colonization surveillance program. It is not intended to diagnose MRSA infection nor to guide or monitor treatment for MRSA infections. Test performance is not FDA approved in patients less than 24 years old. Performed at [facility], [place] [address], [place], [place] [address]          Radiology Studies: DG Chest 2 View  Result Date: 05/10/2021 CLINICAL DATA:  Shortness of breath EXAM: CHEST - 2 VIEW COMPARISON:  04/23/2021 FINDINGS: Cardiac shadow is within normal limits. Aortic calcifications are noted. Mild vascular congestion is noted with minimal edema. Lungs are hyperinflated. Stable fiducial marker is noted in the left lower lung. No focal infiltrate is seen. IMPRESSION: Mild interstitial edema. Electronically Signed   By: Inez Catalina M.D.   On: 05/10/2021 21:35   CT Angio Chest PE W and/or Wo Contrast  Result Date: 05/10/2021 CLINICAL DATA:  Known history of lung carcinoma with productive cough EXAM: CT ANGIOGRAPHY CHEST WITH CONTRAST TECHNIQUE:  Multidetector CT imaging of the chest was performed using the standard protocol during bolus administration of intravenous contrast. Multiplanar CT image reconstructions and MIPs were obtained to evaluate the vascular anatomy. CONTRAST:  43mL OMNIPAQUE IOHEXOL 350 MG/ML SOLN COMPARISON:  04/18/2021 FINDINGS: Cardiovascular: Atherosclerotic calcifications of the thoracic aorta are noted without aneurysmal dilatation or dissection. No cardiac enlargement is seen. Coronary calcifications are noted. Pulmonary artery shows a normal branching pattern bilaterally. Tiny filling defect is noted in the medial right lung base best seen on image number 199 of series 5 which appears to represent a more chronic adherent pulmonary embolus. No other filling defect is identified. Mediastinum/Nodes: Thoracic inlet is within normal limits. No sizable hilar or mediastinal adenopathy is noted. Stable small nodes are noted in the AP window. Esophagus is within normal limits. Lungs/Pleura: Significant emphysematous changes are noted similar to that seen on the prior exam. In the right upper lobe along the major fissure best seen on image number 48  of series 6 there is a 9 by 5 mm nodule identified. The overall size is decreased from the prior examination although the degree of solid component has increased with stable deformity of the major fissure. The nodule in the lateral aspect of the right upper lobe is again seen on image number 67 of series 6 but decreased in size now measuring approximately 8 mm in greatest dimension. Bronchial wall thickening remains in the right lower lobe with evidence of right basilar atelectasis. Calcified granuloma is noted in the right lower lobe as well. The nodular density in the medial posterior right lower lobe is obscured by the atelectatic changes. In the left upper lobe, the area of previously described post radiation changes is again identified with a significant increase in solid component when  compared with the prior exam. This measures approximately 2.2 x 1.6 cm in greatest dimension. This increase in solid component may represent some retraction of the post irradiation changes. The previously seen nodule in the left lower lobe now measures 10 x 5 mm slightly decreased in size when compared with the prior exam. Left basilar atelectasis is noted. Upper Abdomen: Visualized upper abdomen is within normal limits. Musculoskeletal: Degenerative changes of the thoracic spine are noted. No acute rib fracture is seen. Review of the MIP images confirms the above findings. IMPRESSION: Single chronic appearing pulmonary embolus in the right lower lobe pulmonary artery. No other acute appearing pulmonary emboli are noted. Changes in the lungs bilaterally consistent with the patient's known history of lung carcinoma. The previously seen left lower lobe nodule anteriorly has decreased slightly in the interval when compare with the prior exam. Increase in solid component within the right upper lobe nodule along the major fissure. This may represent some focal progression of disease. Attention on follow-up exam is recommended. Increase in solid component within the known left upper lobe changes previously described and is post radiation fibrosis. This may also represent some focal local progression of disease. Aortic Atherosclerosis (ICD10-I70.0) and Emphysema (ICD10-J43.9). Electronically Signed   By: Inez Catalina M.D.   On: 05/10/2021 22:21        Scheduled Meds:  chlorhexidine  15 mL Mouth Rinse BID   Chlorhexidine Gluconate Cloth  6 each Topical Daily   heparin  1,200 Units Intravenous Once   ipratropium-albuterol  3 mL Nebulization Q4H   mouth rinse  15 mL Mouth Rinse q12n4p   methylPREDNISolone (SOLU-MEDROL) injection  125 mg Intravenous Q12H   Followed by   Derrill Memo ON 05/12/2021] predniSONE  40 mg Oral Q breakfast   sodium chloride flush  3 mL Intravenous Q12H   sodium chloride flush  3 mL  Intravenous Q12H   Continuous Infusions:  sodium chloride     cefTRIAXone (ROCEPHIN)  IV Stopped (05/11/21 0351)   dexmedetomidine (PRECEDEX) IV infusion     heparin 650 Units/hr (05/11/21 0800)   magnesium sulfate bolus IVPB       LOS: 1 day    Georgette Shell, MD  05/11/2021, 9:39 AM

## 2021-05-11 NOTE — H&P (Signed)
History and Physical    [name] VQM:[id number] DOB: [birth year] DOA: 05/10/2021  PCP: Janie Morning, DO   Patient coming from: Home   Chief Complaint: Confusion, loss of appetite, cough, fatigue   HPI: [name] is a pleasant 70 y.o. male with medical history significant for COPD, hypertension, PAD, anxiety, and lung cancer, now presenting to the emergency department for evaluation of confusion and hypoxia.  Patient's significant other noted that the patient had recently been experiencing loss of appetite, fatigue, and cough, was started on cough syrup, but then became confused today, prompting her to call EMS.  Patient was reportedly found to be saturating in the 60s on room air and brought into the ED with supplemental oxygen.  ED Course: Upon arrival to the ED, patient is found to be afebrile, saturating low upper 80s to low 90s on 4 L/min of supplemental oxygen, and with stable blood pressure.  EKG features sinus rhythm and chest x-ray with mild interstitial edema.  CTA chest with single chronic appearing PE and right lower lobe pulmonary artery and bilateral changes consistent with known lung carcinoma.  Chemistry panel with sodium 125.  CBC with mild normocytic anemia.  Troponin normal x2, lactic acid normal, and BNP elevated 1941.  Blood cultures were collected and the patient was treated with IV Lasix and started on IV heparin in the ED.  Review of Systems:  Unable to complete ROS secondary to the patient's clinical condition.  Past Medical History:  Diagnosis Date   Allergies    being around corn & grain causes itchy eyes, nasal congestion. He can eat these with no problems.   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,  s/p SBRT   Pancreatitis    x2 stents placed to make patent duct to pancreas   Pulmonary hypertension (HCC)    PASP 48 mmHg 03/26/18 (echo, [facility])   Severe claudication ([place])    Status post left foot surgery    Tobacco abuse     Past Surgical History:  Procedure Laterality Date   ABDOMINAL AORTOGRAM W/LOWER EXTREMITY N/A 03/30/2018   Procedure: ABDOMINAL AORTOGRAM W/LOWER EXTREMITY;  Surgeon: Nigel Mormon, MD;  Location: [facility];  Service: Cardiovascular;  Laterality: N/A;   BRONCHIAL BIOPSY  04/23/2021   Procedure: BRONCHIAL BIOPSIES;  Surgeon: Garner Nash, DO;  Location: [facility];  Service: Pulmonary;;   BRONCHIAL BRUSHINGS  04/23/2021   Procedure: BRONCHIAL BRUSHINGS;  Surgeon: Garner Nash, DO;  Location: [facility];  Service: Pulmonary;;   BRONCHIAL NEEDLE ASPIRATION BIOPSY  04/23/2021   Procedure: BRONCHIAL NEEDLE ASPIRATION BIOPSIES;  Surgeon: Garner Nash, DO;  Location: [facility];  Service: Pulmonary;;   COLECTOMY     COLOSTOMY     COLOSTOMY REVERSAL     EUS N/A 12/14/2014   Procedure: UPPER ENDOSCOPIC ULTRASOUND (EUS) LINEAR;  Surgeon: Milus Banister, MD;  Location: [facility];  Service: Endoscopy;  Laterality: N/A;   FIDUCIAL MARKER PLACEMENT  04/23/2021   Procedure: FIDUCIAL MARKER PLACEMENT;  Surgeon: Garner Nash, DO;  Location: [facility];  Service: Pulmonary;;   FOOT SURGERY Left 2006   INGUINAL HERNIA REPAIR Left 06/18/2018   Procedure: OPEN RECURRENT LEFT INGUINAL  HERNIA REPAIR WITH MESH;  Surgeon: Armandina Gemma, MD;  Location: [facility];  Service: General;  Laterality: Left;   INSERTION OF MESH Left 06/18/2018   Procedure: INSERTION OF MESH;  Surgeon: Armandina Gemma, MD;  Location: [facility];  Service: General;  Laterality: Left;   LAPAROTOMY N/A 06/30/2018   Procedure: EXPLORATORY LAPAROTOMY LYSISIS OF ADHESIONS;  Surgeon: Jovita Kussmaul, MD;  Location: [facility];  Service: General;  Laterality: N/A;   PANCREAS  SURGERY  2007   PERIPHERAL VASCULAR INTERVENTION  03/30/2018   Procedure: PERIPHERAL VASCULAR INTERVENTION;  Surgeon: Nigel Mormon, MD;  Location: [facility];  Service: Cardiovascular;;  Rt Iliac   UPPER GI ENDOSCOPY     VENTRAL HERNIA REPAIR  1967   VIDEO BRONCHOSCOPY WITH RADIAL ENDOBRONCHIAL ULTRASOUND  04/23/2021   Procedure: VIDEO BRONCHOSCOPY WITH RADIAL ENDOBRONCHIAL ULTRASOUND;  Surgeon: Garner Nash, DO;  Location: [facility];  Service: Pulmonary;;    Social History:   reports that he has been smoking cigarettes. He has a 50.00 pack-year smoking history. He has never used smokeless tobacco. He reports current alcohol use of about 6.0 standard drinks per week. He reports current drug use. Drug: Marijuana.  Allergies  Allergen Reactions   Prevacid [Lansoprazole] Other (See Comments)    Bloating ("became swollen with gas")   Varenicline Nausea And Vomiting and Other (See Comments)    Vivid, bad dreams also   Ciprofloxacin Other (See Comments)    GI Upset   Mirtazapine Other (See Comments)    Negatively affected sleep   Omeprazole Other (See Comments)    "Gas"    Family History  Problem Relation Age of Onset   COPD Mother    CAD Mother    Diabetes Father    Cancer Brother        metastatic, unknown primary   Cancer Paternal Grandmother      Prior to Admission medications   Medication Sig Start Date End Date Taking? Authorizing Provider  albuterol (PROVENTIL) (2.5 MG/3ML) 0.083% nebulizer solution Take 3 mLs (2.5 mg total) by nebulization every 2 (two) hours as needed for wheezing or shortness of breath. 07/12/18  Yes Meuth, Brooke A, PA-C  albuterol (VENTOLIN HFA) 108 (90 Base) MCG/ACT inhaler Inhale 2 puffs into the lungs every 6 (six) hours as needed for shortness of breath. 01/14/21  Yes [provider]  ALEVE 220 MG tablet Take 220-440 mg by mouth 2 (two) times daily as needed (for mild pain or headaches).   Yes [provider]   amLODipine (NORVASC) 10 MG tablet Take 10 mg by mouth every morning.  11/07/14  Yes [provider]  Ascorbic Acid (VITAMIN C PO) Take 1 tablet by mouth daily.   Yes [provider]  Budeson-Glycopyrrol-Formoterol (BREZTRI AEROSPHERE) 160-9-4.8 MCG/ACT AERO Inhale 2 puffs into the lungs 2 (two) times daily. 04/29/21  Yes Magdalen Spatz, NP  buPROPion (WELLBUTRIN XL) 150 MG 24 hr tablet Take 150 mg by mouth every morning.  10/30/14  Yes [provider]  CALCIUM PO Take 1 tablet by mouth daily.   Yes [provider]  Cholecalciferol (VITAMIN D-3 PO) Take 1 capsule by mouth daily.   Yes [provider]  fluticasone (FLONASE) 50 MCG/ACT nasal spray Place 2 sprays into both nostrils daily as needed for allergies. 08/27/18  Yes [provider]  ibuprofen (ADVIL) 200 MG tablet Take 200-400 mg by mouth every 8 (eight) hours as needed for mild pain or headache.  Yes [provider]  ipratropium (ATROVENT) 0.02 % nebulizer solution Take 0.25-0.5 mg by nebulization every 6 (six) hours as needed for wheezing or shortness of breath.   Yes [provider]  lactose free nutrition (BOOST) LIQD Take 237 mLs by mouth 3 (three) times daily between meals.   Yes [provider]  metoprolol succinate (TOPROL-XL) 25 MG 24 hr tablet Take 25 mg by mouth daily. 01/14/21  Yes [provider]  Multiple Vitamin (MULTIVITAMIN WITH MINERALS) TABS tablet Take 1 tablet by mouth every morning.   Yes [provider]  Multiple Vitamins-Minerals (ZINC PO) Take 1 tablet by mouth daily.   Yes [provider]  Nutritional Supplements (CARNATION BREAKFAST ESSENTIALS PO) Take 1 packet by mouth daily.   Yes [provider]  Pseudoeph-Bromphen-DM (BROMPHENIRAMINE-DM-PSE PO) Take 5 mLs by mouth 3 (three) times daily as needed (for coughing).   Yes [provider]  ipratropium-albuterol (DUONEB) 0.5-2.5 (3) MG/3ML SOLN Take  3 mLs by nebulization 3 (three) times daily. Patient not taking: Reported on 05/10/2021 07/26/18   Lauraine Rinne, NP    Physical Exam: Vitals:   05/11/21 0030 05/11/21 0045 05/11/21 0100 05/11/21 0200  BP: 131/75 135/87 107/82 (!) 172/148  Pulse: 80 78 74 89  Resp: (!) 23 (!) 21 (!) 23 19  Temp:      TempSrc:      SpO2: 99% 100% 100% 99%  Weight:      Height:        Constitutional: Mild tachypnea, somnolent  Eyes: PERTLA, lids and conjunctivae normal ENMT: Mucous membranes are moist. Posterior pharynx clear of any exudate or lesions.   Neck: supple, no masses  Respiratory: Diminished bilaterally with prolonged expiratory phase and end expiratory wheeze. No cyanosis.  Cardiovascular: S1 & S2 heard, regular rate and rhythm. No diaphoresis.  Abdomen: No distension, no tenderness, soft. Bowel sounds active.  Musculoskeletal: no clubbing / cyanosis. No joint deformity upper and lower extremities.   Skin: no significant rashes. Warm, dry, well-perfused. Neurologic: CN 2-12 grossly intact. Moving all extremities. Somnolent.    Labs and Imaging on Admission: I have personally reviewed following labs and imaging studies  CBC: Recent Labs  Lab 05/10/21 2030  WBC 5.9  NEUTROABS 4.4  HGB 11.1*  HCT 33.2*  MCV 94.6  PLT 332   Basic Metabolic Panel: Recent Labs  Lab 05/10/21 2030  NA 125*  K 4.5  CL 87*  CO2 28  GLUCOSE 130*  BUN 39*  CREATININE 1.09  CALCIUM 9.1   GFR: Estimated Creatinine Clearance: 34.8 mL/min (by C-G formula based on SCr of 1.09 mg/dL). Liver Function Tests: Recent Labs  Lab 05/10/21 2030  AST 51*  ALT 38  ALKPHOS 101  BILITOT 0.5  PROT 7.8  ALBUMIN 3.5   Recent Labs  Lab 05/10/21 2030  LIPASE 25   No results for input(s): AMMONIA in the last 168 hours. Coagulation Profile: Recent Labs  Lab 05/11/21 0000  INR 1.1   Cardiac Enzymes: No results for input(s): CKTOTAL, CKMB, CKMBINDEX, TROPONINI in the last 168 hours. BNP (last 3  results) No results for input(s): PROBNP in the last 8760 hours. HbA1C: No results for input(s): HGBA1C in the last 72 hours. CBG: No results for input(s): GLUCAP in the last 168 hours. Lipid Profile: No results for input(s): CHOL, HDL, LDLCALC, TRIG, CHOLHDL, LDLDIRECT in the last 72 hours. Thyroid Function Tests: No results for input(s): TSH, T4TOTAL, FREET4, T3FREE, THYROIDAB in the last 72 hours.  Anemia Panel: No results for input(s): VITAMINB12, FOLATE, FERRITIN, TIBC, IRON, RETICCTPCT in the last 72 hours. Urine analysis:    Component Value Date/Time   COLORURINE YELLOW 04/08/2016 [place] 04/08/2016 0454   LABSPEC 1.013 04/08/2016 0454   PHURINE 7.5 04/08/2016 0454   GLUCOSEU 100 (A) 04/08/2016 0454   HGBUR NEGATIVE 04/08/2016 0454   BILIRUBINUR NEGATIVE 04/08/2016 0454   KETONESUR NEGATIVE 04/08/2016 0454   PROTEINUR >300 (A) 04/08/2016 0454   NITRITE NEGATIVE 04/08/2016 0454   LEUKOCYTESUR NEGATIVE 04/08/2016 0454   Sepsis Labs: @LABRCNTIP (procalcitonin:4,lacticidven:4) ) Recent Results (from the past 240 hour(s))  Resp Panel by RT-PCR (Flu A&B, Covid)     Status: None   Collection Time: 05/10/21  8:30 PM  Result Value Ref Range Status   SARS Coronavirus 2 by RT PCR NEGATIVE NEGATIVE Final    Comment: (NOTE) SARS-CoV-2 target nucleic acids are NOT DETECTED.  The SARS-CoV-2 RNA is generally detectable in upper respiratory specimens during the acute phase of infection. The lowest concentration of SARS-CoV-2 viral copies this assay can detect is 138 copies/mL. A negative result does not preclude SARS-Cov-2 infection and should not be used as the sole basis for treatment or other patient management decisions. A negative result may occur with  improper specimen collection/handling, submission of specimen other than nasopharyngeal swab, presence of viral mutation(s) within the areas targeted by this assay, and inadequate number of viral copies(<138  copies/mL). A negative result must be combined with clinical observations, patient history, and epidemiological information. The expected result is Negative.  Fact Sheet for Patients:  EntrepreneurPulse.com.au  Fact Sheet for Healthcare Providers:  IncredibleEmployment.be  This test is no t yet approved or cleared by the [place] FDA and  has been authorized for detection and/or diagnosis of SARS-CoV-2 by FDA under an Emergency Use Authorization (EUA). This EUA will remain  in effect (meaning this test can be used) for the duration of the COVID-19 declaration under Section 564(b)(1) of the Act, 21 U.S.C.section 360bbb-3(b)(1), unless the authorization is terminated  or revoked sooner.       Influenza A by PCR NEGATIVE NEGATIVE Final   Influenza B by PCR NEGATIVE NEGATIVE Final    Comment: (NOTE) The Xpert Xpress SARS-CoV-2/FLU/RSV plus assay is intended as an aid in the diagnosis of influenza from Nasopharyngeal swab specimens and should not be used as a sole basis for treatment. Nasal washings and aspirates are unacceptable for Xpert Xpress SARS-CoV-2/FLU/RSV testing.  Fact Sheet for Patients: EntrepreneurPulse.com.au  Fact Sheet for Healthcare Providers: IncredibleEmployment.be  This test is not yet approved or cleared by the [place] FDA and has been authorized for detection and/or diagnosis of SARS-CoV-2 by FDA under an Emergency Use Authorization (EUA). This EUA will remain in effect (meaning this test can be used) for the duration of the COVID-19 declaration under Section 564(b)(1) of the Act, 21 U.S.C. section 360bbb-3(b)(1), unless the authorization is terminated or revoked.  Performed at [facility], [place] [address] [place], [place] [address]      Radiological Exams on Admission: DG Chest 2 View  Result Date: 05/10/2021 CLINICAL DATA:  Shortness of breath  EXAM: CHEST - 2 VIEW COMPARISON:  04/23/2021 FINDINGS: Cardiac shadow is within normal limits. Aortic calcifications are noted. Mild vascular congestion is noted with minimal edema. Lungs are hyperinflated. Stable fiducial marker is noted in the left lower lung. No focal infiltrate is seen. IMPRESSION: Mild interstitial edema. Electronically Signed   By: Linus Mako.D.  On: 05/10/2021 21:35   CT Angio Chest PE W and/or Wo Contrast  Result Date: 05/10/2021 CLINICAL DATA:  Known history of lung carcinoma with productive cough EXAM: CT ANGIOGRAPHY CHEST WITH CONTRAST TECHNIQUE: Multidetector CT imaging of the chest was performed using the standard protocol during bolus administration of intravenous contrast. Multiplanar CT image reconstructions and MIPs were obtained to evaluate the vascular anatomy. CONTRAST:  48mL OMNIPAQUE IOHEXOL 350 MG/ML SOLN COMPARISON:  04/18/2021 FINDINGS: Cardiovascular: Atherosclerotic calcifications of the thoracic aorta are noted without aneurysmal dilatation or dissection. No cardiac enlargement is seen. Coronary calcifications are noted. Pulmonary artery shows a normal branching pattern bilaterally. Tiny filling defect is noted in the medial right lung base best seen on image number 199 of series 5 which appears to represent a more chronic adherent pulmonary embolus. No other filling defect is identified. Mediastinum/Nodes: Thoracic inlet is within normal limits. No sizable hilar or mediastinal adenopathy is noted. Stable small nodes are noted in the AP window. Esophagus is within normal limits. Lungs/Pleura: Significant emphysematous changes are noted similar to that seen on the prior exam. In the right upper lobe along the major fissure best seen on image number 48 of series 6 there is a 9 by 5 mm nodule identified. The overall size is decreased from the prior examination although the degree of solid component has increased with stable deformity of the major fissure. The nodule  in the lateral aspect of the right upper lobe is again seen on image number 67 of series 6 but decreased in size now measuring approximately 8 mm in greatest dimension. Bronchial wall thickening remains in the right lower lobe with evidence of right basilar atelectasis. Calcified granuloma is noted in the right lower lobe as well. The nodular density in the medial posterior right lower lobe is obscured by the atelectatic changes. In the left upper lobe, the area of previously described post radiation changes is again identified with a significant increase in solid component when compared with the prior exam. This measures approximately 2.2 x 1.6 cm in greatest dimension. This increase in solid component may represent some retraction of the post irradiation changes. The previously seen nodule in the left lower lobe now measures 10 x 5 mm slightly decreased in size when compared with the prior exam. Left basilar atelectasis is noted. Upper Abdomen: Visualized upper abdomen is within normal limits. Musculoskeletal: Degenerative changes of the thoracic spine are noted. No acute rib fracture is seen. Review of the MIP images confirms the above findings. IMPRESSION: Single chronic appearing pulmonary embolus in the right lower lobe pulmonary artery. No other acute appearing pulmonary emboli are noted. Changes in the lungs bilaterally consistent with the patient's known history of lung carcinoma. The previously seen left lower lobe nodule anteriorly has decreased slightly in the interval when compare with the prior exam. Increase in solid component within the right upper lobe nodule along the major fissure. This may represent some focal progression of disease. Attention on follow-up exam is recommended. Increase in solid component within the known left upper lobe changes previously described and is post radiation fibrosis. This may also represent some focal local progression of disease. Aortic Atherosclerosis (ICD10-I70.0)  and Emphysema (ICD10-J43.9). Electronically Signed   By: Inez Catalina M.D.   On: 05/10/2021 22:21    EKG: Independently reviewed. Sinus rhythm, significant artifact.   Assessment/Plan   1. Acute hypoxic and hypercarbic respiratory failure  - Pt with lung cancer and emphysema p/w AMS and hypoxia after recent cough  and malaise and found to be acutely hypercarbic and hypoxic with suspected COPD exacerbation and single chronic-appearing PE on CTA  - BNP is significantly elevated without known CHF and IV Lasix was administered in ED  - Treat COPD exacerbation and PE, check echocardiogram, repeat ABG and consider BiPAP   2. Acute COPD exacerbation  - Presents with AMS and hypoxia after recent cough and malaise  - Culture sputum, start systemic steroids and antibiotic, schedule Duonebs, use additional SABA prn    3. Pulmonary embolism  - Single chronic-appearing PE noted in RLL pulmonary artery on CTA in ED  - He has been hemodynamically stable in ED with normal troponin x2 but has significantly elevated BNP without hx of CHF  - IV heparin started in ED  - Continue anticoagulation, check echocardiogram    4. Acute encephalopathy  - Developed confusion today per significant other after recent cough, loss of appetite, and fatigue   - He was hypoxic and hypercarbic on arrival to ED and found to have metabolic derangements  - Check head CT, treat acute respiratory failure as above, continue close monitoring   5. Lung cancer  - Hx of prior metachronous stage Ia malignancies treated with SBRT now RUL and LLL adenocarcinoma on recent tissue sampling  - Planned for SBRT of new lesions  - Follow-up with radiation oncology as planned after discharge   6. Hyponatremia  - Serum sodium 125 on admission  - Given IV Lasix in ED  - Check urine sodium and urine osm, repeat chem panel in am    7. Hypertension  - Continue Norvasc and Toprol as tolerated    DVT prophylaxis: IV heparin  Code Status:  Full  Level of Care: Level of care: Stepdown Family Communication: Significant other updated from ED  Disposition Plan:  Patient is from: home  Anticipated d/c is to: TBD Anticipated d/c date is: 05/15/21  Patient currently: Pending improvement in respiratory and mental status, echocardiogram, conversion to oral anticoagulation  Consults called: none  Admission status: Inpatient     Vianne Bulls, MD [facility]  05/11/2021, 2:35 AM

## 2021-05-11 NOTE — Consult Note (Signed)
NAME:  [name], MRN:  [id number], DOB:  19-Sep-1950, LOS: 1 ADMISSION DATE:  05/10/2021, CONSULTATION DATE:  05/11/21 REFERRING MD:  Dr Coralee Pesa, CHIEF COMPLAINT:  AECOPD, Actue encephalopathy  Clinic pulmonologist Dr. June Leap History of Present Illness:   70 year old male with extreme cachexia, failure to thrive and what obviously looks like severe protein calorie malnutrition, advanced COPD, hypertension, peripheral arterial disease, anxiety and lung cancer.  Admitted to the ED for confusion and hypoxemia.  Pulse ox was in the 60s patient was hypercapnic.  Required 4 L nasal cannula to correct.  Had a CT angiogram chest with single chronic appearing PE in the right lower lobe pulmonary artery and bilateral changes consistent with known lung cancer.  Admission had hyponatremia sodium 125.  BNP elevated at 1941.  Troponin and lactic acid normal.  Started on IV heparin.  Diagnosis of acute hypoxemic and hypercarbic respiratory failure secondary to COPD exacerbation and single chronic appearing PE also treatment for COPD exacerbation.  At admission had acute encephalopathy   Post admission on 05/11/2021 has had worsening symptoms.  On BiPAP and agitated encephalopathy and CCM consulted 05/11/2021.  At the time of consultation is full code.  Lung cancer history -New diagnosis late 2022:Stage IA, NSCLC in the left upper lobe and right lower lobe of the lung on 05/01/2021 Dr. Tammi Klippel plan 50 4GY in 3 fractions to the left upper lobe and right lower lobe.  The treatment is yet to start.  Pertinent  Medical History    has a past medical history of Allergies, Anxiety, COPD (chronic obstructive pulmonary disease) ([place]), Hypertension, Iliac artery stenosis, right ([place]), 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[place]), Lower limb ischemia (03/26/2018), Lung cancer, Pancreatitis, Pulmonary hypertension ([place]), Severe claudication ([place]), Status post left foot surgery, and Tobacco abuse.   reports that he has been  smoking cigarettes. He has a 50.00 pack-year smoking history. He has never used smokeless tobacco.  Past Surgical History:  Procedure Laterality Date   ABDOMINAL AORTOGRAM W/LOWER EXTREMITY N/A 03/30/2018   Procedure: ABDOMINAL AORTOGRAM W/LOWER EXTREMITY;  Surgeon: Nigel Mormon, MD;  Location: [facility];  Service: Cardiovascular;  Laterality: N/A;   BRONCHIAL BIOPSY  04/23/2021   Procedure: BRONCHIAL BIOPSIES;  Surgeon: Garner Nash, DO;  Location: [facility];  Service: Pulmonary;;   BRONCHIAL BRUSHINGS  04/23/2021   Procedure: BRONCHIAL BRUSHINGS;  Surgeon: Garner Nash, DO;  Location: [facility];  Service: Pulmonary;;   BRONCHIAL NEEDLE ASPIRATION BIOPSY  04/23/2021   Procedure: BRONCHIAL NEEDLE ASPIRATION BIOPSIES;  Surgeon: Garner Nash, DO;  Location: [facility];  Service: Pulmonary;;   COLECTOMY     COLOSTOMY     COLOSTOMY REVERSAL     EUS N/A 12/14/2014   Procedure: UPPER ENDOSCOPIC ULTRASOUND (EUS) LINEAR;  Surgeon: Milus Banister, MD;  Location: [facility];  Service: Endoscopy;  Laterality: N/A;   FIDUCIAL MARKER PLACEMENT  04/23/2021   Procedure: FIDUCIAL MARKER PLACEMENT;  Surgeon: Garner Nash, DO;  Location: [facility];  Service: Pulmonary;;   FOOT SURGERY Left 2006   INGUINAL HERNIA REPAIR Left 06/18/2018   Procedure: OPEN RECURRENT LEFT INGUINAL HERNIA REPAIR WITH MESH;  Surgeon: Armandina Gemma, MD;  Location: [facility];  Service: General;  Laterality: Left;   INSERTION OF MESH Left 06/18/2018   Procedure: INSERTION OF MESH;  Surgeon: Armandina Gemma, MD;  Location: [facility];  Service: General;  Laterality: Left;   LAPAROTOMY N/A 06/30/2018   Procedure: EXPLORATORY LAPAROTOMY LYSISIS OF ADHESIONS;  Surgeon: Marlou Starks,  Sena Hitch, MD;  Location: [facility];  Service: General;  Laterality: N/A;   PANCREAS SURGERY  2007   PERIPHERAL VASCULAR INTERVENTION  03/30/2018   Procedure: PERIPHERAL VASCULAR INTERVENTION;  Surgeon: Nigel Mormon, MD;  Location:  [facility];  Service: Cardiovascular;;  Rt Iliac   UPPER GI ENDOSCOPY     VENTRAL HERNIA REPAIR  1967   VIDEO BRONCHOSCOPY WITH RADIAL ENDOBRONCHIAL ULTRASOUND  04/23/2021   Procedure: VIDEO BRONCHOSCOPY WITH RADIAL ENDOBRONCHIAL ULTRASOUND;  Surgeon: Garner Nash, DO;  Location: [facility];  Service: Pulmonary;;    Allergies  Allergen Reactions   Prevacid [Lansoprazole] Other (See Comments)    Bloating ("became swollen with gas")   Varenicline Nausea And Vomiting and Other (See Comments)    Vivid, bad dreams also   Ciprofloxacin Other (See Comments)    GI Upset   Mirtazapine Other (See Comments)    Negatively affected sleep   Omeprazole Other (See Comments)    "Gas"    Immunization History  Administered Date(s) Administered   Influenza, High Dose Seasonal PF 04/29/2018   PFIZER(Purple Top)SARS-COV-2 Vaccination 04/27/2020, 10/29/2020, 04/09/2021    Family History  Problem Relation Age of Onset   COPD Mother    CAD Mother    Diabetes Father    Cancer Brother        metastatic, unknown primary   Cancer Paternal Grandmother      Current Facility-Administered Medications:    0.9 %  sodium chloride infusion, 250 mL, Intravenous, PRN, Opyd, Ilene Qua, MD   acetaminophen (TYLENOL) tablet 650 mg, 650 mg, Oral, Q6H PRN **OR** acetaminophen (TYLENOL) suppository 650 mg, 650 mg, Rectal, Q6H PRN, Opyd, Timothy S, MD   albuterol (PROVENTIL) (2.5 MG/3ML) 0.083% nebulizer solution 2.5 mg, 2.5 mg, Nebulization, Q2H PRN, Opyd, Ilene Qua, MD   amLODipine (NORVASC) tablet 10 mg, 10 mg, Oral, q morning, Opyd, Ilene Qua, MD   buPROPion (WELLBUTRIN XL) 24 hr tablet 150 mg, 150 mg, Oral, q morning, Opyd, Ilene Qua, MD   cefTRIAXone (ROCEPHIN) 1 g in sodium chloride 0.9 % 100 mL IVPB, 1 g, Intravenous, Q24H, Opyd, Ilene Qua, MD, Stopped at 05/11/21 0351   chlorhexidine (PERIDEX) 0.12 % solution 15 mL, 15 mL, Mouth Rinse, BID, Opyd, Ilene Qua, MD, 15 mL at 05/11/21 0321    Chlorhexidine Gluconate Cloth 2 % PADS 6 each, 6 each, Topical, Daily, Opyd, Ilene Qua, MD, 6 each at 05/11/21 0150   dexmedetomidine (PRECEDEX) 200 MCG/50ML (4 mcg/mL) infusion, 0.4-1.2 mcg/kg/hr, Intravenous, Titrated, Pacen Watford, MD   heparin ADULT infusion 100 units/mL (25000 units/273mL), 650 Units/hr, Intravenous, Continuous, Opyd, Ilene Qua, MD, Last Rate: 6.5 mL/hr at 05/11/21 0800, 650 Units/hr at 05/11/21 0800   ipratropium-albuterol (DUONEB) 0.5-2.5 (3) MG/3ML nebulizer solution 3 mL, 3 mL, Nebulization, Q6H, Opyd, Ilene Qua, MD, 3 mL at 05/11/21 0732   MEDLINE mouth rinse, 15 mL, Mouth Rinse, q12n4p, Opyd, Ilene Qua, MD   methylPREDNISolone sodium succinate (SOLU-MEDROL) 125 mg/2 mL injection 125 mg, 125 mg, Intravenous, Q12H, 125 mg at 05/11/21 0317 **FOLLOWED BY** [START ON 05/12/2021] predniSONE (DELTASONE) tablet 40 mg, 40 mg, Oral, Q breakfast, Opyd, Ilene Qua, MD   metoprolol succinate (TOPROL-XL) 24 hr tablet 25 mg, 25 mg, Oral, Daily, Opyd, Timothy S, MD   ondansetron (ZOFRAN) tablet 4 mg, 4 mg, Oral, Q6H PRN **OR** ondansetron (ZOFRAN) injection 4 mg, 4 mg, Intravenous, Q6H PRN, Opyd, Timothy S, MD   senna-docusate (Senokot-S) tablet 1 tablet, 1 tablet, Oral, QHS PRN, Opyd,  Ilene Qua, MD   sodium chloride flush (NS) 0.9 % injection 3 mL, 3 mL, Intravenous, Q12H, Opyd, Timothy S, MD, 3 mL at 05/11/21 0322   sodium chloride flush (NS) 0.9 % injection 3 mL, 3 mL, Intravenous, Q12H, Opyd, Timothy S, MD, 3 mL at 05/11/21 0322   sodium chloride flush (NS) 0.9 % injection 3 mL, 3 mL, Intravenous, PRN, Opyd, Ilene Qua, MD   Significant Hospital Events: Including procedures, antibiotic start and stop dates in addition to other pertinent events   05/10/2021: Admission  Interim History / Subjective:  05/11/2021: On BiPAP and CCM consultation  Objective   Blood pressure 136/63, pulse 91, temperature 97.7 F (36.5 C), temperature source Axillary, resp. rate (!) 21, height 5\' 2"   (1.575 m), weight 38.5 kg, SpO2 98 %.   Body mass index is 15.52 kg/m.  FiO2 (%):  [50 %] 50 %   Intake/Output Summary (Last 24 hours) at 05/11/2021 0853 Last data filed at 05/11/2021 0800 Gross per 24 hour  Intake 170.58 ml  Output 50 ml  Net 120.58 ml   Filed Weights   05/10/21 1956  Weight: 38.5 kg    Examination: General: extremely frail, cachectic, malnourshes with diffuse muscle wasting HENT: BiPAP On . Beard Lungs: Barrell chest, . Some left cracles. Moving breathing with BiPAP. No paradoxus Cardiovascular: Mild tachy. Normal heart sounds Abdomen: soft Extremities: intact Neuro: Alert  was agitated GU: not examined  Resolved Hospital Problem list   x   Assessment & Plan:  ASSESSMENT / PLAN:  PULMONARY  A:  Baseline Gold stage III severe COPD [FEV1 1.25 L / 49% in 2016] with chronic respiratory failure  - Prior to & Present on Admit Acute on chronic hypoxemic and hypercarbic respiratory failure secondary to COPD exacerbation -primary admission problem Suspicion of chronic PE newly recognized at admission    05/11/2021 -> slow to recover.  Course complicated by acute encephalopathy  P:   Continue bronchodilatation -increase from every 6 hours to every 4 hours Empiric magnesium sulfate IV steroid to continue BiPAP support for hypercarbia Intubate if worse IV heparin   NEUROLOGIC A:   On home Wellbutrin Acute agitated encephalopathy -present on admission 05/10/2021  Plus/20/22: Persistent/worse  P:   Precedex Hold off on CT head [no focal deficits]     VASCULAR A:   History of hypertension at home: On Norvasc  05/11/2021: Blood pressure okay  P:  Monitor with Precedex   CARDIAC STRUCTURAL A: No prior echo.  Admission cardiac enzymes normal  P: Check echo   CARDIAC ELECTRICAL A: Sinus tachycardia at admit   P: Telemetry  INFECTIOUS A:   Unclear cause of COPD exacerbation -flu and COVID PCR negative.  Procalcitonin okay  P:    Check urine strep and urine Legionella 05/11/2021 Check multiplex respiratory virus panel Empiric ceftriaxone 05/11/2021 >] 05/16/2021]     RENAL A:  Normal kidney function but at risk for electrolyte imbalance P:  Monitor  ELECTROLYTES A:  At risk for electrolyte imbalance P: Monitor and replete   GASTROINTESTINAL A:   At risk for gastric ulcer stress ulcer  P:   PPI  HEMATOLOGIC   - HEME A: Mild anemia of chronic disease presented admission    P:  - PRBC for hgb </= 6.9gm%    - exceptions are   -  if ACS susepcted/confirmed then transfuse for hgb </= 8.0gm%,  or    -  active bleeding with hemodynamic instability, then transfuse regardless of hemoglobin  value   At at all times try to transfuse 1 unit prbc as possible with exception of active hemorrhage   HEMATOLOGIC - Platelets A At this thrombocytopenia with IV heparin  P Monitor with IV heparin  ENDOCRINE A:   At risk for hypo and hyperglycemia P:   SSI per the hospitalist  MSK/DERM Severe protein calorie malnutrition - Prior to & Present on Admit Failure to thrive - Prior to & Present on Admit Cachexia -Prior to & Present on Admit Physical debility and deconditioning - Prior to & Present on Admit 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  Plan - Monitor   Best Practice (right click and "Reselect all SmartList Selections" daily)   Diet/type: NPO w/ meds via tube DVT prophylaxis: systemic heparin GI prophylaxis: PPI Lines: N/A Foley:  N/A Code Status:  full code Last date of multidisciplinary goals of care discussion [per triad MD 05/11/2021 ]     ATTESTATION & SIGNATURE   The patient Olga Seyler is critically ill with multiple organ systems failure and requires high complexity decision making for assessment and support, frequent evaluation and titration of therapies, application of advanced monitoring technologies and extensive interpretation of multiple  databases.   Critical Care Time devoted to patient care services described in this note is  45  Minutes. This time reflects time of care of this signee Dr Brand Males. This critical care time does not reflect procedure time, or teaching time or supervisory time of PA/NP/Med student/Med Resident etc but could involve care discussion time     Dr. Brand Males, M.D., [facility].C.P Pulmonary and Critical Care Medicine Staff Physician [facility] Pager: [phone], If no answer or between  15:00h - 7:00h: call [phone]  05/11/2021 8:53 AM   LABS    PULMONARY Recent Labs  Lab 05/10/21 2020 05/11/21 0240 05/11/21 0745  PHART 7.254* 7.146* 7.234*  PCO2ART 64.2* 87.5* 62.1*  PO2ART 76.6* 277* 169*  HCO3 27.4 29.0* 25.3  O2SAT 93.1 99.5 98.8    CBC Recent Labs  Lab 05/10/21 2030 05/11/21 0745  HGB 11.1* 11.3*  HCT 33.2* 34.9*  WBC 5.9 5.9  PLT 279 260    COAGULATION Recent Labs  Lab 05/11/21 0000  INR 1.1    CARDIAC  No results for input(s): TROPONINI in the last 168 hours. No results for input(s): PROBNP in the last 168 hours.   CHEMISTRY Recent Labs  Lab 05/10/21 2030  NA 125*  K 4.5  CL 87*  CO2 28  GLUCOSE 130*  BUN 39*  CREATININE 1.09  CALCIUM 9.1   Estimated Creatinine Clearance: 34.8 mL/min (by C-G formula based on SCr of 1.09 mg/dL).   LIVER Recent Labs  Lab 05/10/21 2030 05/11/21 0000  AST 51*  --   ALT 38  --   ALKPHOS 101  --   BILITOT 0.5  --   PROT 7.8  --   ALBUMIN 3.5  --   INR  --  1.1     INFECTIOUS Recent Labs  Lab 05/10/21 2030 05/11/21 0000  LATICACIDVEN 0.9 0.7     ENDOCRINE CBG (last 3)  No results for input(s): GLUCAP in the last 72 hours.       IMAGING x48h  - image(s) personally visualized  -   highlighted in bold DG Chest 2 View  Result Date: 05/10/2021 CLINICAL DATA:  Shortness of breath EXAM: CHEST - 2 VIEW COMPARISON:  04/23/2021  FINDINGS: Cardiac shadow is within normal limits. Aortic calcifications are noted. Mild vascular congestion is noted with minimal edema. Lungs are hyperinflated. Stable fiducial marker is noted in the left lower lung. No focal infiltrate is seen. IMPRESSION: Mild interstitial edema. Electronically Signed   By: Inez Catalina M.D.   On: 05/10/2021 21:35   CT Angio Chest PE W and/or Wo Contrast  Result Date: 05/10/2021 CLINICAL DATA:  Known history of lung carcinoma with productive cough EXAM: CT ANGIOGRAPHY CHEST WITH CONTRAST TECHNIQUE: Multidetector CT imaging of the chest was performed using the standard protocol during bolus administration of intravenous contrast. Multiplanar CT image reconstructions and MIPs were obtained to evaluate the vascular anatomy. CONTRAST:  72mL OMNIPAQUE IOHEXOL 350 MG/ML SOLN COMPARISON:  04/18/2021 FINDINGS: Cardiovascular: Atherosclerotic calcifications of the thoracic aorta are noted without aneurysmal dilatation or dissection. No cardiac enlargement is seen. Coronary calcifications are noted. Pulmonary artery shows a normal branching pattern bilaterally. Tiny filling defect is noted in the medial right lung base best seen on image number 199 of series 5 which appears to represent a more chronic adherent pulmonary embolus. No other filling defect is identified. Mediastinum/Nodes: Thoracic inlet is within normal limits. No sizable hilar or mediastinal adenopathy is noted. Stable small nodes are noted in the AP window. Esophagus is within normal limits. Lungs/Pleura: Significant emphysematous changes are noted similar to that seen on the prior exam. In the right upper lobe along the major fissure best seen on image number 48 of series 6 there is a 9 by 5 mm nodule identified. The overall size is decreased from the prior examination although the degree of solid component has increased with stable deformity of the major fissure. The nodule in the lateral aspect of the right upper  lobe is again seen on image number 67 of series 6 but decreased in size now measuring approximately 8 mm in greatest dimension. Bronchial wall thickening remains in the right lower lobe with evidence of right basilar atelectasis. Calcified granuloma is noted in the right lower lobe as well. The nodular density in the medial posterior right lower lobe is obscured by the atelectatic changes. In the left upper lobe, the area of previously described post radiation changes is again identified with a significant increase in solid component when compared with the prior exam. This measures approximately 2.2 x 1.6 cm in greatest dimension. This increase in solid component may represent some retraction of the post irradiation changes. The previously seen nodule in the left lower lobe now measures 10 x 5 mm slightly decreased in size when compared with the prior exam. Left basilar atelectasis is noted. Upper Abdomen: Visualized upper abdomen is within normal limits. Musculoskeletal: Degenerative changes of the thoracic spine are noted. No acute rib fracture is seen. Review of the MIP images confirms the above findings. IMPRESSION: Single chronic appearing pulmonary embolus in the right lower lobe pulmonary artery. No other acute appearing pulmonary emboli are noted. Changes in the lungs bilaterally consistent with the patient's known history of lung carcinoma. The previously seen left lower lobe nodule anteriorly has decreased slightly in the interval when compare with the prior exam. Increase in solid component within the right upper lobe nodule along the major fissure. This may represent some focal progression of disease. Attention on follow-up exam is recommended. Increase in solid component within the known left upper lobe changes previously described and is post radiation fibrosis. This may also represent some focal local progression of disease. Aortic Atherosclerosis (ICD10-I70.0) and  Emphysema (ICD10-J43.9).  Electronically Signed   By: Inez Catalina M.D.   On: 05/10/2021 22:21

## 2021-05-12 ENCOUNTER — Inpatient Hospital Stay (HOSPITAL_COMMUNITY): Payer: Medicare Other

## 2021-05-12 DIAGNOSIS — J9601 Acute respiratory failure with hypoxia: Secondary | ICD-10-CM | POA: Diagnosis not present

## 2021-05-12 DIAGNOSIS — J9602 Acute respiratory failure with hypercapnia: Secondary | ICD-10-CM | POA: Diagnosis not present

## 2021-05-12 LAB — CBC
HCT: 32.1 % — ABNORMAL LOW (ref 39.0–52.0)
Hemoglobin: 11 g/dL — ABNORMAL LOW (ref 13.0–17.0)
MCH: 31.8 pg (ref 26.0–34.0)
MCHC: 34.3 g/dL (ref 30.0–36.0)
MCV: 92.8 fL (ref 80.0–100.0)
Platelets: 295 10*3/uL (ref 150–400)
RBC: 3.46 MIL/uL — ABNORMAL LOW (ref 4.22–5.81)
RDW: 13.8 % (ref 11.5–15.5)
WBC: 13.6 10*3/uL — ABNORMAL HIGH (ref 4.0–10.5)
nRBC: 0 % (ref 0.0–0.2)

## 2021-05-12 LAB — BASIC METABOLIC PANEL
Anion gap: 11 (ref 5–15)
BUN: 43 mg/dL — ABNORMAL HIGH (ref 8–23)
CO2: 28 mmol/L (ref 22–32)
Calcium: 8.5 mg/dL — ABNORMAL LOW (ref 8.9–10.3)
Chloride: 91 mmol/L — ABNORMAL LOW (ref 98–111)
Creatinine, Ser: 0.95 mg/dL (ref 0.61–1.24)
GFR, Estimated: >60 mL/min (ref >60)
Glucose, Bld: 172 mg/dL — ABNORMAL HIGH (ref 70–99)
Potassium: 3.3 mmol/L — ABNORMAL LOW (ref 3.5–5.1)
Sodium: 130 mmol/L — ABNORMAL LOW (ref 135–145)

## 2021-05-12 LAB — HEPARIN LEVEL (UNFRACTIONATED)
Heparin Unfractionated: 0.43 IU/mL (ref 0.30–0.70)
Heparin Unfractionated: 0.59 IU/mL (ref 0.30–0.70)

## 2021-05-12 LAB — MAGNESIUM: Magnesium: 2.5 mg/dL — ABNORMAL HIGH (ref 1.7–2.4)

## 2021-05-12 LAB — PHOSPHORUS: Phosphorus: 4.4 mg/dL (ref 2.5–4.6)

## 2021-05-12 MED ORDER — DOXYCYCLINE HYCLATE 100 MG PO TABS
100.0000 mg | ORAL_TABLET | Freq: Two times a day (BID) | ORAL | Status: DC
  Administered 2021-05-12 – 2021-05-13 (×2): 100 mg via ORAL
  Filled 2021-05-12 (×2): qty 1

## 2021-05-12 MED ORDER — FUROSEMIDE 10 MG/ML IJ SOLN
20.0000 mg | Freq: Two times a day (BID) | INTRAMUSCULAR | Status: DC
  Administered 2021-05-12 – 2021-05-14 (×4): 20 mg via INTRAVENOUS
  Filled 2021-05-12 (×4): qty 2

## 2021-05-12 MED ORDER — IPRATROPIUM-ALBUTEROL 0.5-2.5 (3) MG/3ML IN SOLN
3.0000 mL | Freq: Four times a day (QID) | RESPIRATORY_TRACT | Status: DC
  Administered 2021-05-13: 3 mL via RESPIRATORY_TRACT
  Filled 2021-05-12: qty 3

## 2021-05-12 MED ORDER — POTASSIUM CHLORIDE CRYS ER 10 MEQ PO TBCR
10.0000 meq | EXTENDED_RELEASE_TABLET | Freq: Two times a day (BID) | ORAL | Status: DC
  Administered 2021-05-12 – 2021-05-14 (×5): 10 meq via ORAL
  Filled 2021-05-12 (×5): qty 1

## 2021-05-12 MED ORDER — LACTULOSE 10 GM/15ML PO SOLN
20.0000 g | Freq: Two times a day (BID) | ORAL | Status: DC
  Administered 2021-05-12 – 2021-05-14 (×5): 20 g via ORAL
  Filled 2021-05-12 (×5): qty 30

## 2021-05-12 NOTE — Consult Note (Signed)
NAME:  [name], MRN:  [id number], DOB:  [birth year], LOS: 2 ADMISSION DATE:  05/10/2021, CONSULTATION DATE:  05/11/21 REFERRING MD:  Dr Coralee Pesa, CHIEF COMPLAINT:  AECOPD, Actue encephalopathy  Clinic pulmonologist Dr. June Leap History of Present Illness:   70 year old male with extreme cachexia, failure to thrive and what obviously looks like severe protein calorie malnutrition, advanced COPD, hypertension, peripheral arterial disease, anxiety and lung cancer.  Admitted to the ED for confusion and hypoxemia.  Pulse ox was in the 60s patient was hypercapnic.  Required 4 L nasal cannula to correct.  Had a CT angiogram chest with single chronic appearing PE in the right lower lobe pulmonary artery and bilateral changes consistent with known lung cancer.  Admission had hyponatremia sodium 125.  BNP elevated at 1941.  Troponin and lactic acid normal.  Started on IV heparin.  Diagnosis of acute hypoxemic and hypercarbic respiratory failure secondary to COPD exacerbation and single chronic appearing PE also treatment for COPD exacerbation.  At admission had acute encephalopathy   Post admission on 05/11/2021 has had worsening symptoms.  On BiPAP and agitated encephalopathy and CCM consulted 05/11/2021.  At the time of consultation is full code.  Lung cancer history -New diagnosis late 2022:Stage IA, NSCLC in the left upper lobe and right lower lobe of the lung on 05/01/2021 Dr. Tammi Klippel plan 50 4GY in 3 fractions to the left upper lobe and right lower lobe.  The treatment is yet to start.  Pertinent  Medical History    has a past medical history of Allergies, Anxiety, COPD (chronic obstructive pulmonary disease) ([place]), Hypertension, Iliac artery stenosis, right ([place]), 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[place]), Lower limb ischemia (03/26/2018), Lung cancer, Pancreatitis, Pulmonary hypertension ([place]), Severe claudication ([place]), Status post left foot surgery, and Tobacco abuse.   reports that he has been  smoking cigarettes. He has a 50.00 pack-year smoking history. He has never used smokeless tobacco.  Past Surgical History:  Procedure Laterality Date   ABDOMINAL AORTOGRAM W/LOWER EXTREMITY N/A 03/30/2018   Procedure: ABDOMINAL AORTOGRAM W/LOWER EXTREMITY;  Surgeon: Nigel Mormon, MD;  Location: [facility];  Service: Cardiovascular;  Laterality: N/A;   BRONCHIAL BIOPSY  04/23/2021   Procedure: BRONCHIAL BIOPSIES;  Surgeon: Garner Nash, DO;  Location: [facility];  Service: Pulmonary;;   BRONCHIAL BRUSHINGS  04/23/2021   Procedure: BRONCHIAL BRUSHINGS;  Surgeon: Garner Nash, DO;  Location: [facility];  Service: Pulmonary;;   BRONCHIAL NEEDLE ASPIRATION BIOPSY  04/23/2021   Procedure: BRONCHIAL NEEDLE ASPIRATION BIOPSIES;  Surgeon: Garner Nash, DO;  Location: [facility];  Service: Pulmonary;;   COLECTOMY     COLOSTOMY     COLOSTOMY REVERSAL     EUS N/A 12/14/2014   Procedure: UPPER ENDOSCOPIC ULTRASOUND (EUS) LINEAR;  Surgeon: Milus Banister, MD;  Location: [facility];  Service: Endoscopy;  Laterality: N/A;   FIDUCIAL MARKER PLACEMENT  04/23/2021   Procedure: FIDUCIAL MARKER PLACEMENT;  Surgeon: Garner Nash, DO;  Location: [facility];  Service: Pulmonary;;   FOOT SURGERY Left 2006   INGUINAL HERNIA REPAIR Left 06/18/2018   Procedure: OPEN RECURRENT LEFT INGUINAL HERNIA REPAIR WITH MESH;  Surgeon: Armandina Gemma, MD;  Location: [facility];  Service: General;  Laterality: Left;   INSERTION OF MESH Left 06/18/2018   Procedure: INSERTION OF MESH;  Surgeon: Armandina Gemma, MD;  Location: [facility];  Service: General;  Laterality: Left;   LAPAROTOMY N/A 06/30/2018   Procedure: EXPLORATORY LAPAROTOMY LYSISIS OF ADHESIONS;  Surgeon: Marlou Starks,  Sena Hitch, MD;  Location: [facility];  Service: General;  Laterality: N/A;   PANCREAS SURGERY  2007   PERIPHERAL VASCULAR INTERVENTION  03/30/2018   Procedure: PERIPHERAL VASCULAR INTERVENTION;  Surgeon: Nigel Mormon, MD;  Location:  [facility];  Service: Cardiovascular;;  Rt Iliac   UPPER GI ENDOSCOPY     VENTRAL HERNIA REPAIR  1967   VIDEO BRONCHOSCOPY WITH RADIAL ENDOBRONCHIAL ULTRASOUND  04/23/2021   Procedure: VIDEO BRONCHOSCOPY WITH RADIAL ENDOBRONCHIAL ULTRASOUND;  Surgeon: Garner Nash, DO;  Location: [facility];  Service: Pulmonary;;    Allergies  Allergen Reactions   Prevacid [Lansoprazole] Other (See Comments)    Bloating ("became swollen with gas")   Varenicline Nausea And Vomiting and Other (See Comments)    Vivid, bad dreams also   Ciprofloxacin Other (See Comments)    GI Upset   Mirtazapine Other (See Comments)    Negatively affected sleep   Omeprazole Other (See Comments)    "Gas"    Immunization History  Administered Date(s) Administered   Influenza, High Dose Seasonal PF 04/29/2018   PFIZER(Purple Top)SARS-COV-2 Vaccination 04/27/2020, 10/29/2020, 04/09/2021    Family History  Problem Relation Age of Onset   COPD Mother    CAD Mother    Diabetes Father    Cancer Brother        metastatic, unknown primary   Cancer Paternal Grandmother      Current Facility-Administered Medications:    0.9 %  sodium chloride infusion, 250 mL, Intravenous, PRN, Opyd, Ilene Qua, MD   acetaminophen (TYLENOL) tablet 650 mg, 650 mg, Oral, Q6H PRN **OR** acetaminophen (TYLENOL) suppository 650 mg, 650 mg, Rectal, Q6H PRN, Opyd, Timothy S, MD   albuterol (PROVENTIL) (2.5 MG/3ML) 0.083% nebulizer solution 2.5 mg, 2.5 mg, Nebulization, Q2H PRN, Opyd, Ilene Qua, MD   cefTRIAXone (ROCEPHIN) 1 g in sodium chloride 0.9 % 100 mL IVPB, 1 g, Intravenous, Q24H, Opyd, Ilene Qua, MD, Stopped at 05/12/21 0330   chlorhexidine (PERIDEX) 0.12 % solution 15 mL, 15 mL, Mouth Rinse, BID, Opyd, Ilene Qua, MD, 15 mL at 05/12/21 1105   Chlorhexidine Gluconate Cloth 2 % PADS 6 each, 6 each, Topical, Daily, Opyd, Ilene Qua, MD, 6 each at 05/11/21 0150   furosemide (LASIX) injection 20 mg, 20 mg, Intravenous, BID,  Georgette Shell, MD, 20 mg at 05/12/21 1828   heparin ADULT infusion 100 units/mL (25000 units/256mL), 1,000 Units/hr, Intravenous, Continuous, Adrian Saran, RPH, Last Rate: 10 mL/hr at 05/12/21 1700, 1,000 Units/hr at 05/12/21 1700   ipratropium-albuterol (DUONEB) 0.5-2.5 (3) MG/3ML nebulizer solution 3 mL, 3 mL, Nebulization, Q4H, Mayleen Borrero, MD, 3 mL at 05/12/21 1504   lactulose (CHRONULAC) 10 GM/15ML solution 20 g, 20 g, Oral, BID, Georgette Shell, MD, 20 g at 05/12/21 1545   lactulose (CHRONULAC) enema 200 gm, 300 mL, Rectal, Daily, Georgette Shell, MD, 300 mL at 05/12/21 1156   MEDLINE mouth rinse, 15 mL, Mouth Rinse, q12n4p, Opyd, Ilene Qua, MD, 15 mL at 05/12/21 1504   ondansetron (ZOFRAN) tablet 4 mg, 4 mg, Oral, Q6H PRN **OR** ondansetron (ZOFRAN) injection 4 mg, 4 mg, Intravenous, Q6H PRN, Opyd, Ilene Qua, MD   potassium chloride (KLOR-CON M) CR tablet 10 mEq, 10 mEq, Oral, BID, Georgette Shell, MD, 10 mEq at 05/12/21 1545   [COMPLETED] methylPREDNISolone sodium succinate (SOLU-MEDROL) 125 mg/2 mL injection 125 mg, 125 mg, Intravenous, Q12H, 125 mg at 05/11/21 1707 **FOLLOWED BY** predniSONE (DELTASONE) tablet 40 mg, 40 mg, Oral, Q  breakfast, Opyd, Ilene Qua, MD, 40 mg at 05/12/21 0817   senna-docusate (Senokot-S) tablet 1 tablet, 1 tablet, Oral, QHS PRN, Opyd, Ilene Qua, MD   sodium chloride flush (NS) 0.9 % injection 3 mL, 3 mL, Intravenous, Q12H, Opyd, Ilene Qua, MD, 3 mL at 05/11/21 0322   sodium chloride flush (NS) 0.9 % injection 3 mL, 3 mL, Intravenous, Q12H, Opyd, Ilene Qua, MD, 3 mL at 05/12/21 1154   sodium chloride flush (NS) 0.9 % injection 3 mL, 3 mL, Intravenous, PRN, Opyd, Ilene Qua, MD Allergies  Allergen Reactions   Prevacid [Lansoprazole] Other (See Comments)    Bloating ("became swollen with gas")   Varenicline Nausea And Vomiting and Other (See Comments)    Vivid, bad dreams also   Ciprofloxacin Other (See Comments)    GI Upset    Mirtazapine Other (See Comments)    Negatively affected sleep   Omeprazole Other (See Comments)    "Gas"     Significant Hospital Events: Including procedures, antibiotic start and stop dates in addition to other pertinent events   05/10/2021: Admission 05/11/2021: On BiPAP and CCM consultatio  Interim History / Subjective:   05/12/21: Off BiPAP. Eating. Off precedex. Much improved. DNR now  Objective   Blood pressure (!) 154/75, pulse (!) 108, temperature 97.7 F (36.5 C), temperature source Oral, resp. rate (!) 22, height 5\' 2"  (1.575 m), weight 38.5 kg, SpO2 (!) 82 %.   Body mass index is 15.52 kg/m.  FiO2 (%):  [36 %-40 %] 36 %   Intake/Output Summary (Last 24 hours) at 05/12/2021 1853 Last data filed at 05/12/2021 1700 Gross per 24 hour  Intake 442.47 ml  Output 1100 ml  Net -657.53 ml   Filed Weights   05/10/21 1956  Weight: 38.5 kg    Examination: General Appearance:  Looks cachectic . Frail, sitting in bed., Eating. Dishevereled. Diffuse muscle wasting Head:  Normocephalic, without obvious abnormality, atraumatic Eyes:  PERRL - yes, conjunctiva/corneas - muddy     Ears:  Normal external ear canals, both ears Nose:  G tube - No. Has [place] o2 Throat:  ETT TUBE - no , OG - NO Neck:  Supple,  No enlargement/tenderness/nodules Lungs: Clear to auscultation bilaterally, [place] chest. No wheeze Heart:  S1 and S2 normal, no murmur, CVP - no.  Pressors - no Abdomen:  Soft, no masses, no organomegaly Genitalia / Rectal:  Not done Extremities:  Extremities- intact Skin:  ntact in exposed areas . Sacral area - not examined Neurologic:  Sedation - none -> RASS - +1 . Moves all 4s - yes. CAM-ICU - neg . Orientation - x3+     Resolved Hospital Problem list   x    Assessment & Plan:  ASSESSMENT / PLAN:  PULMONARY  A:  Active 50ppd smoker + [place] smoker - Prior to & Present on Admit  Baseline Gold stage III severe COPD [FEV1 1.25 L / 49% in 2016] with chronic  respiratory failure  - Prior to & Present on Admit Acute on chronic hypoxemic and hypercarbic respiratory failure secondary to COPD exacerbation -primary admission problem Suspicion of chronic PE newly recognized at admission    05/12/2021 -> improved  P:   Check ABG Change BiPAP to [facility] BiPAP QHS -> needs ABG pre dc to decide on BiPAP QHS at home Continue bronchodilatation - every 4 hours IV steroid cchange -> po prednisome 40 -> needs 2 week taper Intubate if worse IV heparin for now - need to  determine of doing DOAC or not (need to ensure no fall risk) Needs to quit msoking   NEUROLOGIC A:   On home Wellbutrin Acute agitated encephalopathy -present on admission 05/10/2021  12/4 - resolved. Off Precredex  P:   Precedex dc from [facility] Hold off on CT head [no focal deficits]     VASCULAR A:   History of hypertension at home: On Norvasc  05/12/2021: Blood pressure okay  P:  Per [facility]   CARDIAC STRUCTURAL A: Admission cardiac enzymes normal. ECHO with [facility]., Pulm Htn - discovered post admit but present on admit  P: No specific intervention   CARDIAC ELECTRICAL A: Sinus tachycardia at admit   P: Telemetry  INFECTIOUS A:   Unclear cause of COPD exacerbation -flu and COVID PCR negative.   Multiplex PCR negProcalcitonin okay. Urine strep neg  P:   Await  Legionella 05/11/2021 Check multiplex respiratory virus panel Empiric ceftriaxone 05/11/2021 >05/12/21 -> change to doxy po bid 05/12/21 (4 daus)     RENAL A:  Normal kidney function but at risk for electrolyte imbalance P:  Monitor  ELECTROLYTES A:  Mild hypokalemia P: Per [facility]   GASTROINTESTINAL A:   At risk for gastric ulcer stress ulcer  P:   PPI  HEMATOLOGIC   - HEME A: Mild anemia of chronic disease presented admission    P:  - PRBC for hgb </= 6.9gm%    - exceptions are   -  if ACS susepcted/confirmed then transfuse for hgb </= 8.0gm%,  or    -  active  bleeding with hemodynamic instability, then transfuse regardless of hemoglobin value   At at all times try to transfuse 1 unit prbc as possible with exception of active hemorrhage   HEMATOLOGIC - Platelets A At risk thrombocytopenia with IV heparin  P Monitor with IV heparin  ENDOCRINE A:   At risk for hypo and hyperglycemia P:   SSI per the hospitalist  MSK/DERM Severe protein calorie malnutrition - Prior to & Present on Admit Failure to thrive - Prior to & Present on Admit Cachexia -Prior to & Present on Admit Physical debility and deconditioning - Prior to & Present on Admit 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  Plan - Monitor   Best Practice (right click and "Reselect all SmartList Selections" daily)   Diet/type: NPO w/ meds via tube DVT prophylaxis: systemic heparin GI prophylaxis: PPI Lines: N/A Foley:  N/A Code Status:  DNR  but full medical care Last date of multidisciplinary goals of care discussion [per [facility] MD 05/12/2021) ]  Dispo  - go to Med surg with [facility] bipap  -> check aBG 05/13/21 - > needs dc bipap decision - cancel OPD with Dr Valeta Harms 05/13/21 - neewds new Opd appt      ATTESTATION & SIGNATURE    SIGNATURE    Dr. Brand Males, M.D., F.C.C.P,  Pulmonary and Critical Care Medicine Staff Physician, [place] Director - Interstitial Lung Disease  Program  Pulmonary [place] at [place], [place], [address]  NPI Number:  NPI #[id number]  Pager: [phone], If no answer  -> Check AMION or Try [phone] Telephone (clinical office): [phone] Telephone (research): [phone]  7:03 PM 05/12/2021  [place]  Lab 05/10/21 2020 05/11/21 0240 05/11/21 0745  PHART 7.254* 7.146* 7.234*  PCO2ART 64.2* 87.5* 62.1*  PO2ART 76.6* 277* 169*  HCO3 27.4 29.0* 25.3  O2SAT 93.1 99.5 98.8    CBC Recent Labs  Lab  05/10/21 2030 05/11/21 0745 05/12/21 0023  HGB 11.1* 11.3* 11.0*  HCT 33.2* 34.9* 32.1*  WBC 5.9 5.9 13.6*  PLT 279 260 295    COAGULATION Recent Labs  Lab 05/11/21 0000  INR 1.1    CARDIAC  No results for input(s): TROPONINI in the last 168 hours. No results for input(s): PROBNP in the last 168 hours.   CHEMISTRY Recent Labs  Lab 05/10/21 2030 05/11/21 0745 05/12/21 0023  NA 125* 125* 130*  K 4.5 4.1 3.3*  CL 87* 90* 91*  CO2 28 23 28   GLUCOSE 130* 163* 172*  BUN 39* 40* 43*  CREATININE 1.09 1.17 0.95  CALCIUM 9.1 8.8* 8.5*  MG  --   --  2.5*  PHOS  --   --  4.4   Estimated Creatinine Clearance: 40 mL/min (by C-G formula based on SCr of 0.95 mg/dL).   LIVER Recent Labs  Lab 05/10/21 2030 05/11/21 0000 05/11/21 0745  AST 51*  --  45*  ALT 38  --  36  ALKPHOS 101  --  108  BILITOT 0.5  --  0.5  PROT 7.8  --  7.2  ALBUMIN 3.5  --  3.2*  INR  --  1.1  --      INFECTIOUS Recent Labs  Lab 05/10/21 2030 05/11/21 0000  LATICACIDVEN 0.9 0.7     ENDOCRINE CBG (last 3)  No results for input(s): GLUCAP in the last 72 hours.       IMAGING x48h  - image(s) personally visualized  -   highlighted in bold DG Chest 2 View  Result Date: 05/10/2021 CLINICAL DATA:  Shortness of breath EXAM: CHEST - 2 VIEW COMPARISON:  04/23/2021 FINDINGS: Cardiac shadow is within normal limits. Aortic calcifications are noted. Mild vascular congestion is noted with minimal edema. Lungs are hyperinflated. Stable fiducial marker is noted in the left lower lung. No focal infiltrate is seen. IMPRESSION: Mild interstitial edema. Electronically Signed   By: Inez Catalina M.D.   On: 05/10/2021 21:35   CT Angio Chest PE W and/or Wo Contrast  Result Date: 05/10/2021 CLINICAL DATA:  Known history of lung carcinoma with productive cough EXAM: CT ANGIOGRAPHY CHEST WITH CONTRAST TECHNIQUE: Multidetector CT imaging of the chest was performed using the standard protocol during bolus  administration of intravenous contrast. Multiplanar CT image reconstructions and MIPs were obtained to evaluate the vascular anatomy. CONTRAST:  30mL OMNIPAQUE IOHEXOL 350 MG/ML SOLN COMPARISON:  04/18/2021 FINDINGS: Cardiovascular: Atherosclerotic calcifications of the thoracic aorta are noted without aneurysmal dilatation or dissection. No cardiac enlargement is seen. Coronary calcifications are noted. Pulmonary artery shows a normal branching pattern bilaterally. Tiny filling defect is noted in the medial right lung base best seen on image number 199 of series 5 which appears to represent a more chronic adherent pulmonary embolus. No other filling defect is identified. Mediastinum/Nodes: Thoracic inlet is within normal limits. No sizable hilar or mediastinal adenopathy is noted. Stable small nodes are noted in the AP window. Esophagus is within normal limits. Lungs/Pleura: Significant emphysematous changes are noted similar to that seen on the prior exam. In the right upper lobe along the major fissure best seen on image number 48 of series 6 there is a 9 by 5 mm nodule identified. The overall size is decreased from the prior examination although the degree of solid component has increased with stable deformity of  the major fissure. The nodule in the lateral aspect of the right upper lobe is again seen on image number 67 of series 6 but decreased in size now measuring approximately 8 mm in greatest dimension. Bronchial wall thickening remains in the right lower lobe with evidence of right basilar atelectasis. Calcified granuloma is noted in the right lower lobe as well. The nodular density in the medial posterior right lower lobe is obscured by the atelectatic changes. In the left upper lobe, the area of previously described post radiation changes is again identified with a significant increase in solid component when compared with the prior exam. This measures approximately 2.2 x 1.6 cm in greatest dimension.  This increase in solid component may represent some retraction of the post irradiation changes. The previously seen nodule in the left lower lobe now measures 10 x 5 mm slightly decreased in size when compared with the prior exam. Left basilar atelectasis is noted. Upper Abdomen: Visualized upper abdomen is within normal limits. Musculoskeletal: Degenerative changes of the thoracic spine are noted. No acute rib fracture is seen. Review of the MIP images confirms the above findings. IMPRESSION: Single chronic appearing pulmonary embolus in the right lower lobe pulmonary artery. No other acute appearing pulmonary emboli are noted. Changes in the lungs bilaterally consistent with the patient's known history of lung carcinoma. The previously seen left lower lobe nodule anteriorly has decreased slightly in the interval when compare with the prior exam. Increase in solid component within the right upper lobe nodule along the major fissure. This may represent some focal progression of disease. Attention on follow-up exam is recommended. Increase in solid component within the known left upper lobe changes previously described and is post radiation fibrosis. This may also represent some focal local progression of disease. Aortic Atherosclerosis (ICD10-I70.0) and Emphysema (ICD10-J43.9). Electronically Signed   By: Inez Catalina M.D.   On: 05/10/2021 22:21   ECHOCARDIOGRAM COMPLETE  Result Date: 05/11/2021    ECHOCARDIOGRAM REPORT   Patient Name:   DIMITRIS SHANAHAN Date of Exam: 05/11/2021 Medical Rec #:  [id number]       Height:       62.0 in Accession #:    [id number]      Weight:       84.9 lb Date of Birth:  10/08/1950       BSA:          1.328 m Patient Age:    32 years        BP:           127/59 mmHg Patient Gender: M               HR:           74 bpm. Exam Location:  Inpatient Procedure: 2D Echo, Cardiac Doppler and Color Doppler Indications:     Pulmonary Embolus I26.09  History:         Patient has no prior history  of Echocardiogram examinations.                  COPD and Pulmonary HTN; Risk Factors:Hypertension and Current                  Smoker.  Sonographer:     Bernadene Person RDCS Referring Phys:  [id number] Ilene Qua OPYD Diagnosing Phys: Vernell Leep MD  Sonographer Comments: Echo performed with patient supine and on artificial respirator. IMPRESSIONS  1. Left ventricular ejection fraction, by estimation, is 60 to 65%. The  left ventricle has normal function. The left ventricle has no regional wall motion abnormalities. Left ventricular diastolic parameters are consistent with Grade III diastolic dysfunction (restrictive).  2. Right ventricular systolic function is low normal. The right ventricular size is mildly enlarged. There is severely elevated pulmonary artery systolic pressure. Estimated PASP 64 mmHg.  3. Left atrial size was mildly dilated.  4. The mitral valve is grossly normal. Trivial mitral valve regurgitation.  5. Tricuspid valve regurgitation is moderate to severe.  6. The aortic valve is tricuspid. Aortic valve regurgitation is not visualized.  7. The inferior vena cava is dilated in size with >50% respiratory variability, suggesting right atrial pressure of 8 mmHg. FINDINGS  Left Ventricle: Left ventricular ejection fraction, by estimation, is 60 to 65%. The left ventricle has normal function. The left ventricle has no regional wall motion abnormalities. The left ventricular internal cavity size was normal in size. There is  no left ventricular hypertrophy. Left ventricular diastolic parameters are consistent with Grade III diastolic dysfunction (restrictive). Right Ventricle: The right ventricular size is mildly enlarged. No increase in right ventricular wall thickness. Right ventricular systolic function is low normal. There is severely elevated pulmonary artery systolic pressure. The tricuspid regurgitant velocity is 3.75 m/s, and with an assumed right atrial pressure of 8 mmHg, the estimated right  ventricular systolic pressure is 50.5 mmHg. Left Atrium: Left atrial size was mildly dilated. Right Atrium: Right atrial size was normal in size. Pericardium: There is no evidence of pericardial effusion. Mitral Valve: The mitral valve is grossly normal. Trivial mitral valve regurgitation. Tricuspid Valve: The tricuspid valve is grossly normal. Tricuspid valve regurgitation is moderate to severe. Aortic Valve: The aortic valve is tricuspid. Aortic valve regurgitation is not visualized. Pulmonic Valve: The pulmonic valve was grossly normal. Pulmonic valve regurgitation is not visualized. Aorta: The aortic root and ascending aorta are structurally normal, with no evidence of dilitation. Venous: The inferior vena cava is dilated in size with greater than 50% respiratory variability, suggesting right atrial pressure of 8 mmHg. IAS/Shunts: The interatrial septum was not assessed.  LEFT VENTRICLE PLAX 2D LVIDd:         3.70 cm   Diastology LVIDs:         2.50 cm   LV e' medial:    6.89 cm/s LV PW:         0.80 cm   LV E/e' medial:  15.5 LV IVS:        0.60 cm   LV e' lateral:   9.89 cm/s LVOT diam:     2.00 cm   LV E/e' lateral: 10.8 LV SV:         45 LV SV Index:   34 LVOT Area:     3.14 cm  RIGHT VENTRICLE RV S prime:     9.16 cm/s TAPSE (M-mode): 1.7 cm LEFT ATRIUM             Index        RIGHT ATRIUM           Index LA diam:        3.00 cm 2.26 cm/m   RA Area:     10.70 cm LA Vol (A2C):   26.2 ml 19.73 ml/m  RA Volume:   24.70 ml  18.60 ml/m LA Vol (A4C):   26.4 ml 19.88 ml/m LA Biplane Vol: 27.9 ml 21.01 ml/m  AORTIC VALVE LVOT Vmax:   71.00 cm/s LVOT Vmean:  41.800 cm/s LVOT VTI:  0.143 m  AORTA Ao Root diam: 2.90 cm Ao Asc diam:  3.10 cm MITRAL VALVE                TRICUSPID VALVE MV Area (PHT): 4.89 cm     TR Peak grad:   56.2 mmHg MV Decel Time: 155 msec     TR Vmax:        375.00 cm/s MV E velocity: 107.00 cm/s MV A velocity: 43.30 cm/s   SHUNTS MV E/A ratio:  2.47         Systemic VTI:  0.14 m                              Systemic Diam: 2.00 cm Vernell Leep MD Electronically signed by Vernell Leep MD Signature Date/Time: 05/11/2021/1:00:37 PM    Final

## 2021-05-12 NOTE — Progress Notes (Signed)
ANTICOAGULATION CONSULT NOTE   Pharmacy Consult for heparin Indication: pulmonary embolus  Allergies  Allergen Reactions   Prevacid [Lansoprazole] Other (See Comments)    Bloating ("became swollen with gas")   Varenicline Nausea And Vomiting and Other (See Comments)    Vivid, bad dreams also   Ciprofloxacin Other (See Comments)    GI Upset   Mirtazapine Other (See Comments)    Negatively affected sleep   Omeprazole Other (See Comments)    "Gas"   Patient Measurements: Height: 5\' 2"  (157.5 cm) Weight: 38.5 kg (84 lb 14 oz) IBW/kg (Calculated) : 54.6 Heparin Dosing Weight: 38.5kg  Vital Signs: Temp: 97.9 F (36.6 C) (12/04 0400) Temp Source: Axillary (12/04 0400) BP: 147/77 (12/04 0600) Pulse Rate: 78 (12/04 0600)  Labs: Recent Labs    05/10/21 2030 05/11/21 0000 05/11/21 0745 05/11/21 1614 05/12/21 0023  HGB 11.1*  --  11.3*  --  11.0*  HCT 33.2*  --  34.9*  --  32.1*  PLT 279  --  260  --  295  APTT  --  32  --   --   --   LABPROT  --  13.7  --   --   --   INR  --  1.1  --   --   --   HEPARINUNFRC  --   --  <0.10* 0.25* 0.43  CREATININE 1.09  --  1.17  --  0.95  TROPONINIHS 12 12  --   --   --     Estimated Creatinine Clearance: 40 mL/min (by C-G formula based on SCr of 0.95 mg/dL).  Medical History: Past Medical History:  Diagnosis Date   Allergies    being around corn & grain causes itchy eyes, nasal congestion. He can eat these with no problems.   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  Pharmacy consulted to dose heparin drip for PE.  No previous AC noted  Hgb 11.1, Plts 279, Scr 1.09 Baseline aPTT 32 sec, INR 1.1 Heparin bolus 2000 units x 1 then start heparin drip at 650 units/hr  Goal of Therapy:  Heparin level 0.3-0.7 units/ml Monitor platelets by anticoagulation protocol: Yes  Today, 05/12/2021 0030 HL 0.43 therapeutic on 1000 units/hr Hgb 11, plts WNL Per RN, no issues or bleeding noted 0730 confirmatory level 0.59 units/ml, in therapeutic range   Plan:  Continue heparin drip at 1000 units/hr Continues to require BiPAP, confused, no diet yet Daily CBC, daily Heparin level   Minda Ditto PharmD [facility] [phone] 05/12/2021, 7:14 AM

## 2021-05-12 NOTE — Progress Notes (Signed)
ANTICOAGULATION CONSULT NOTE   Pharmacy Consult for heparin Indication: pulmonary embolus  Allergies  Allergen Reactions   Prevacid [Lansoprazole] Other (See Comments)    Bloating ("became swollen with gas")   Varenicline Nausea And Vomiting and Other (See Comments)    Vivid, bad dreams also   Ciprofloxacin Other (See Comments)    GI Upset   Mirtazapine Other (See Comments)    Negatively affected sleep   Omeprazole Other (See Comments)    "Gas"   Patient Measurements: Height: 5\' 2"  (157.5 cm) Weight: 38.5 kg (84 lb 14 oz) IBW/kg (Calculated) : 54.6 Heparin Dosing Weight: 38.5kg  Vital Signs: Temp: 98 F (36.7 C) (12/03 2300) Temp Source: Axillary (12/03 2300) BP: 157/83 (12/03 2300) Pulse Rate: 94 (12/03 2200)  Labs: Recent Labs    05/10/21 2030 05/11/21 0000 05/11/21 0745 05/11/21 1614 05/12/21 0023  HGB 11.1*  --  11.3*  --  11.0*  HCT 33.2*  --  34.9*  --  32.1*  PLT 279  --  260  --  295  APTT  --  32  --   --   --   LABPROT  --  13.7  --   --   --   INR  --  1.1  --   --   --   HEPARINUNFRC  --   --  <0.10* 0.25* 0.43  CREATININE 1.09  --  1.17  --  0.95  TROPONINIHS 12 12  --   --   --     Estimated Creatinine Clearance: 40 mL/min (by C-G formula based on SCr of 0.95 mg/dL).  Medical History: Past Medical History:  Diagnosis Date   Allergies    being around corn & grain causes itchy eyes, nasal congestion. He can eat these with no problems.   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  Pharmacy consulted to dose heparin drip for PE.  No previous AC noted  Hgb 11.1, Plts 279, Scr 1.09 Baseline aPTT 32 sec, INR 1.1 Heparin bolus 2000 units x 1 then start heparin drip at 650 units/hr  Goal of Therapy:  Heparin level 0.3-0.7 units/ml Monitor platelets by anticoagulation protocol: Yes  Today, 05/12/2021 HL 0.43 therapeutic on 1000 units/hr Hgb 11, plts WNL Per RN, no issues or bleeding noted   Plan:  Continue heparin drip at 1000 units/hr Confirmatory heparin level in 6 hours Daily CBC, plan daily Heparin level at steady state   [name] 05/12/2021, 1:00 AM

## 2021-05-12 NOTE — Progress Notes (Signed)
PROGRESS NOTE    [name]  IHW:[id number] DOB: [birth year] DOA: 05/10/2021 PCP: Janie Morning, DO   Brief Narrative: 70 year old male with history of lung CA non-small cell lung cancer in the left upper lobe and right lower lobe diagnosed 05/01/2021, end-stage COPD hypertension PAD anxiety admitted with hypoxia and confusion.  Patient lives at home with his significant other who called EMS as patient was more confused with cough decreased appetite and fatigue.  Patient was saturating in the 60s on room air with EMS.  Work-up in the ED revealed CT angiogram of the chest with single chronic appearing pulmonary embolism and right lower lobe bilateral changes consistent with known lung carcinoma.  His sodium was 125.  BNP was elevated at 1941.  Lactic acid was normal troponin was normal.  Patient was started on IV heparin and Lasix in the ED. ABG 7.2 3/62/169 from 7.1 4/87/277  Assessment & Plan:   Principal Problem:   Acute respiratory failure with hypoxia and hypercarbia (HCC) Active Problems:   Hyponatremia   COPD with acute exacerbation (HCC)   Essential hypertension   PAD (peripheral artery disease) (HCC)   Acute metabolic encephalopathy   Pulmonary embolism (HCC)   Lung cancer (HCC)   #1 acute on chronic hypoxic/hypercapnic respiratory failure secondary to acute COPD exacerbation in the setting of lung cancer and severe COPD, CHF with elevated BNP and chronic PE.  Patient appears more awake and alert.  Continues to use BiPAP as needed and nightly. Continue Lasix Steroids taper as tolerated Nebulizer treatments IV heparin Echocardiogram   #2  CHF with elevated BNP follow-up echocardiogram and continue Lasix.cxr interstitial edema   #3 pulmonary embolism appears chronic in the setting of malignancy continue IV heparin  #4 acute metabolic encephalopathy improving.  Multifactorial secondary to hypoxia hypercapnia elevated ammonia   #5 history of lung CA has outpatient  follow-up with Dr. Tammi Klippel for radiation to new lesions.  #6  Hyper volemic hyponatremia sodium improved with diuresis.    #7 polysubstance abuse check urine drug screen  #8 elevated ammonia patient did not tolerate rectal lactulose yesterday.    #9 history of essential hypertension was on Norvasc and Toprol at home.  #10 goals of care-discussed with daughter healthcare POA patient is DNR DNI.  Estimated body mass index is 15.52 kg/m as calculated from the following:   Height as of this encounter: 5\' 2"  (1.575 m).   Weight as of this encounter: 38.5 kg.  DVT prophylaxis:heparin Code Status:full Family Communication: dw significant other/unable to reach daughter multiple attempts Disposition Plan:  Status is: Inpatient  Remains inpatient appropriate because: bipap resp failure   Consultants:  pccm  Procedures:none Antimicrobials: none Subjective: He was on BiPAP overnight Still has mittens in place much more awake than yesterday Answer some questions appropriately moves all extremities. Objective: Vitals:   05/12/21 0600 05/12/21 0700 05/12/21 0845 05/12/21 1155  BP: (!) 147/77 (!) 144/78    Pulse: 78 83 82   Resp: 15 (!) 22 20   Temp:  97.7 F (36.5 C)    TempSrc:  Oral    SpO2: 100% 99% 97% 100%  Weight:      Height:        Intake/Output Summary (Last 24 hours) at 05/12/2021 1259 Last data filed at 05/12/2021 1200 Gross per 24 hour  Intake 416.55 ml  Output 550 ml  Net -133.45 ml    Filed Weights   05/10/21 1956  Weight: 38.5 kg    Examination:  General  exam: Appears much more awake answering some questions appropriately Appears frail chronically ill Respiratory system: Decreased breath sounds on to auscultation. Respiratory effort normal. Cardiovascular system: S1 & S2 heard, RRR. No JVD, murmurs, rubs, gallops or clicks. No pedal edema. Gastrointestinal system: Abdomen is nondistended, soft and nontender. No organomegaly or masses felt. Normal bowel  sounds heard. Central nervous system: Awake Extremities: No edema Skin: No rashes, lesions or ulcers Psychiatry:unable to assess   Data Reviewed: I have personally reviewed following labs and imaging studies  CBC: Recent Labs  Lab 05/10/21 2030 05/11/21 0745 05/12/21 0023  WBC 5.9 5.9 13.6*  NEUTROABS 4.4  --   --   HGB 11.1* 11.3* 11.0*  HCT 33.2* 34.9* 32.1*  MCV 94.6 99.1 92.8  PLT 279 260 161    Basic Metabolic Panel: Recent Labs  Lab 05/10/21 2030 05/11/21 0745 05/12/21 0023  NA 125* 125* 130*  K 4.5 4.1 3.3*  CL 87* 90* 91*  CO2 28 23 28   GLUCOSE 130* 163* 172*  BUN 39* 40* 43*  CREATININE 1.09 1.17 0.95  CALCIUM 9.1 8.8* 8.5*  MG  --   --  2.5*  PHOS  --   --  4.4    GFR: Estimated Creatinine Clearance: 40 mL/min (by C-G formula based on SCr of 0.95 mg/dL). Liver Function Tests: Recent Labs  Lab 05/10/21 2030 05/11/21 0745  AST 51* 45*  ALT 38 36  ALKPHOS 101 108  BILITOT 0.5 0.5  PROT 7.8 7.2  ALBUMIN 3.5 3.2*    Recent Labs  Lab 05/10/21 2030  LIPASE 25    Recent Labs  Lab 05/11/21 0745  AMMONIA 76*   Coagulation Profile: Recent Labs  Lab 05/11/21 0000  INR 1.1    Cardiac Enzymes: No results for input(s): CKTOTAL, CKMB, CKMBINDEX, TROPONINI in the last 168 hours. BNP (last 3 results) No results for input(s): PROBNP in the last 8760 hours. HbA1C: No results for input(s): HGBA1C in the last 72 hours. CBG: No results for input(s): GLUCAP in the last 168 hours. Lipid Profile: No results for input(s): CHOL, HDL, LDLCALC, TRIG, CHOLHDL, LDLDIRECT in the last 72 hours. Thyroid Function Tests: Recent Labs    05/11/21 0745  TSH 4.039   Anemia Panel: No results for input(s): VITAMINB12, FOLATE, FERRITIN, TIBC, IRON, RETICCTPCT in the last 72 hours. Sepsis Labs: Recent Labs  Lab 05/10/21 2030 05/11/21 0000  LATICACIDVEN 0.9 0.7     Recent Results (from the past 240 hour(s))  Blood culture (routine x 2)     Status:  None (Preliminary result)   Collection Time: 05/10/21  8:30 PM   Specimen: BLOOD  Result Value Ref Range Status   Specimen Description   Final    BLOOD RIGHT ANTECUBITAL Performed at [facility] [address], [place], [place] [address]    Special Requests   Final    BOTTLES DRAWN AEROBIC AND ANAEROBIC Blood Culture results may not be optimal due to an excessive volume of blood received in culture bottles Performed at [facility] [address] [place], [place] [address]    Culture   Final    NO GROWTH 1 DAY Performed at [facility], [place] [address], [place], [place] [address]    Report Status PENDING  Incomplete  Blood culture (routine x 2)     Status: None (Preliminary result)   Collection Time: 05/10/21  8:30 PM   Specimen: BLOOD  Result Value Ref Range Status  Specimen Description   Final    BLOOD LEFT ANTECUBITAL Performed at [facility] [address] [place], [place] [address]    Special Requests   Final    BOTTLES DRAWN AEROBIC AND ANAEROBIC Blood Culture results may not be optimal due to an excessive volume of blood received in culture bottles Performed at [facility] [address], [place], [place] [address]    Culture   Final    NO GROWTH 1 DAY Performed at [facility], [place] [address] [place], [place] [address]    Report Status PENDING  Incomplete  Resp Panel by RT-PCR (Flu A&B, Covid)     Status: None   Collection Time: 05/10/21  8:30 PM  Result Value Ref Range Status   SARS Coronavirus 2 by RT PCR NEGATIVE NEGATIVE Final    Comment: (NOTE) SARS-CoV-2 target nucleic acids are NOT DETECTED.  The SARS-CoV-2 RNA is generally detectable in upper respiratory specimens during the acute phase of infection. The lowest concentration of SARS-CoV-2 viral copies this assay can detect is 138 copies/mL. A negative result does not preclude SARS-Cov-2 infection and should  not be used as the sole basis for treatment or other patient management decisions. A negative result may occur with  improper specimen collection/handling, submission of specimen other than nasopharyngeal swab, presence of viral mutation(s) within the areas targeted by this assay, and inadequate number of viral copies(<138 copies/mL). A negative result must be combined with clinical observations, patient history, and epidemiological information. The expected result is Negative.  Fact Sheet for Patients:  EntrepreneurPulse.com.au  Fact Sheet for Healthcare Providers:  IncredibleEmployment.be  This test is no t yet approved or cleared by the [place] FDA and  has been authorized for detection and/or diagnosis of SARS-CoV-2 by FDA under an Emergency Use Authorization (EUA). This EUA will remain  in effect (meaning this test can be used) for the duration of the COVID-19 declaration under Section 564(b)(1) of the Act, 21 U.S.C.section 360bbb-3(b)(1), unless the authorization is terminated  or revoked sooner.       Influenza A by PCR NEGATIVE NEGATIVE Final   Influenza B by PCR NEGATIVE NEGATIVE Final    Comment: (NOTE) The Xpert Xpress SARS-CoV-2/FLU/RSV plus assay is intended as an aid in the diagnosis of influenza from Nasopharyngeal swab specimens and should not be used as a sole basis for treatment. Nasal washings and aspirates are unacceptable for Xpert Xpress SARS-CoV-2/FLU/RSV testing.  Fact Sheet for Patients: EntrepreneurPulse.com.au  Fact Sheet for Healthcare Providers: IncredibleEmployment.be  This test is not yet approved or cleared by the [place] FDA and has been authorized for detection and/or diagnosis of SARS-CoV-2 by FDA under an Emergency Use Authorization (EUA). This EUA will remain in effect (meaning this test can be used) for the duration of the COVID-19 declaration under  Section 564(b)(1) of the Act, 21 U.S.C. section 360bbb-3(b)(1), unless the authorization is terminated or revoked.  Performed at [facility], [place] [address], [place], [place] [address]   MRSA Next Gen by PCR, Nasal     Status: None   Collection Time: 05/11/21  1:53 AM   Specimen: Nasal Mucosa; Nasal Swab  Result Value Ref Range Status   MRSA by PCR Next Gen NOT DETECTED NOT DETECTED Final    Comment: (NOTE) The GeneXpert MRSA Assay (FDA approved for NASAL specimens only), is one component of a comprehensive MRSA colonization surveillance program. It is not intended to diagnose MRSA infection nor  to guide or monitor treatment for MRSA infections. Test performance is not FDA approved in patients less than 81 years old. Performed at [facility], [place] [address] [place], [place] [address]   Respiratory (~20 pathogens) panel by PCR     Status: None   Collection Time: 05/11/21 10:49 AM   Specimen: Nasopharyngeal Swab; Respiratory  Result Value Ref Range Status   Adenovirus NOT DETECTED NOT DETECTED Final   Coronavirus 229E NOT DETECTED NOT DETECTED Final    Comment: (NOTE) The Coronavirus on the Respiratory Panel, DOES NOT test for the novel  Coronavirus (2019 nCoV)    Coronavirus HKU1 NOT DETECTED NOT DETECTED Final   Coronavirus NL63 NOT DETECTED NOT DETECTED Final   Coronavirus OC43 NOT DETECTED NOT DETECTED Final   Metapneumovirus NOT DETECTED NOT DETECTED Final   Rhinovirus / Enterovirus NOT DETECTED NOT DETECTED Final   Influenza A NOT DETECTED NOT DETECTED Final   Influenza B NOT DETECTED NOT DETECTED Final   Parainfluenza Virus 1 NOT DETECTED NOT DETECTED Final   Parainfluenza Virus 2 NOT DETECTED NOT DETECTED Final   Parainfluenza Virus 3 NOT DETECTED NOT DETECTED Final   Parainfluenza Virus 4 NOT DETECTED NOT DETECTED Final   Respiratory Syncytial Virus NOT DETECTED NOT DETECTED Final   Bordetella pertussis NOT DETECTED NOT  DETECTED Final   Bordetella Parapertussis NOT DETECTED NOT DETECTED Final   Chlamydophila pneumoniae NOT DETECTED NOT DETECTED Final   Mycoplasma pneumoniae NOT DETECTED NOT DETECTED Final    Comment: Performed at [facility], [place]. [address], [place], [place] [address]          Radiology Studies: DG Chest 2 View  Result Date: 05/10/2021 CLINICAL DATA:  Shortness of breath EXAM: CHEST - 2 VIEW COMPARISON:  04/23/2021 FINDINGS: Cardiac shadow is within normal limits. Aortic calcifications are noted. Mild vascular congestion is noted with minimal edema. Lungs are hyperinflated. Stable fiducial marker is noted in the left lower lung. No focal infiltrate is seen. IMPRESSION: Mild interstitial edema. Electronically Signed   By: Inez Catalina M.D.   On: 05/10/2021 21:35   CT Angio Chest PE W and/or Wo Contrast  Result Date: 05/10/2021 CLINICAL DATA:  Known history of lung carcinoma with productive cough EXAM: CT ANGIOGRAPHY CHEST WITH CONTRAST TECHNIQUE: Multidetector CT imaging of the chest was performed using the standard protocol during bolus administration of intravenous contrast. Multiplanar CT image reconstructions and MIPs were obtained to evaluate the vascular anatomy. CONTRAST:  24mL OMNIPAQUE IOHEXOL 350 MG/ML SOLN COMPARISON:  04/18/2021 FINDINGS: Cardiovascular: Atherosclerotic calcifications of the thoracic aorta are noted without aneurysmal dilatation or dissection. No cardiac enlargement is seen. Coronary calcifications are noted. Pulmonary artery shows a normal branching pattern bilaterally. Tiny filling defect is noted in the medial right lung base best seen on image number 199 of series 5 which appears to represent a more chronic adherent pulmonary embolus. No other filling defect is identified. Mediastinum/Nodes: Thoracic inlet is within normal limits. No sizable hilar or mediastinal adenopathy is noted. Stable small nodes are noted in the AP window. Esophagus is within normal  limits. Lungs/Pleura: Significant emphysematous changes are noted similar to that seen on the prior exam. In the right upper lobe along the major fissure best seen on image number 48 of series 6 there is a 9 by 5 mm nodule identified. The overall size is decreased from the prior examination although the degree of solid component has increased with stable deformity of the major fissure. The nodule  in the lateral aspect of the right upper lobe is again seen on image number 67 of series 6 but decreased in size now measuring approximately 8 mm in greatest dimension. Bronchial wall thickening remains in the right lower lobe with evidence of right basilar atelectasis. Calcified granuloma is noted in the right lower lobe as well. The nodular density in the medial posterior right lower lobe is obscured by the atelectatic changes. In the left upper lobe, the area of previously described post radiation changes is again identified with a significant increase in solid component when compared with the prior exam. This measures approximately 2.2 x 1.6 cm in greatest dimension. This increase in solid component may represent some retraction of the post irradiation changes. The previously seen nodule in the left lower lobe now measures 10 x 5 mm slightly decreased in size when compared with the prior exam. Left basilar atelectasis is noted. Upper Abdomen: Visualized upper abdomen is within normal limits. Musculoskeletal: Degenerative changes of the thoracic spine are noted. No acute rib fracture is seen. Review of the MIP images confirms the above findings. IMPRESSION: Single chronic appearing pulmonary embolus in the right lower lobe pulmonary artery. No other acute appearing pulmonary emboli are noted. Changes in the lungs bilaterally consistent with the patient's known history of lung carcinoma. The previously seen left lower lobe nodule anteriorly has decreased slightly in the interval when compare with the prior exam. Increase  in solid component within the right upper lobe nodule along the major fissure. This may represent some focal progression of disease. Attention on follow-up exam is recommended. Increase in solid component within the known left upper lobe changes previously described and is post radiation fibrosis. This may also represent some focal local progression of disease. Aortic Atherosclerosis (ICD10-I70.0) and Emphysema (ICD10-J43.9). Electronically Signed   By: Inez Catalina M.D.   On: 05/10/2021 22:21   ECHOCARDIOGRAM COMPLETE  Result Date: 05/11/2021    ECHOCARDIOGRAM REPORT   Patient Name:   [name] Date of Exam: 05/11/2021 Medical Rec #:  [id number]       Height:       62.0 in Accession #:    [id number]      Weight:       84.9 lb Date of Birth:  [birth year]       BSA:          1.328 m Patient Age:    81 years        BP:           127/59 mmHg Patient Gender: M               HR:           74 bpm. Exam Location:  Inpatient Procedure: 2D Echo, Cardiac Doppler and Color Doppler Indications:     Pulmonary Embolus I26.09  History:         Patient has no prior history of Echocardiogram examinations.                  COPD and Pulmonary HTN; Risk Factors:Hypertension and Current                  Smoker.  Sonographer:     Bernadene Person RDCS Referring Phys:  [id number] Ilene Qua OPYD Diagnosing Phys: Vernell Leep MD  Sonographer Comments: Echo performed with patient supine and on artificial respirator. IMPRESSIONS  1. Left ventricular ejection fraction, by estimation, is 60 to 65%. The left ventricle has normal function. The  left ventricle has no regional wall motion abnormalities. Left ventricular diastolic parameters are consistent with Grade III diastolic dysfunction (restrictive).  2. Right ventricular systolic function is low normal. The right ventricular size is mildly enlarged. There is severely elevated pulmonary artery systolic pressure. Estimated PASP 64 mmHg.  3. Left atrial size was mildly dilated.  4. The  mitral valve is grossly normal. Trivial mitral valve regurgitation.  5. Tricuspid valve regurgitation is moderate to severe.  6. The aortic valve is tricuspid. Aortic valve regurgitation is not visualized.  7. The inferior vena cava is dilated in size with >50% respiratory variability, suggesting right atrial pressure of 8 mmHg. FINDINGS  Left Ventricle: Left ventricular ejection fraction, by estimation, is 60 to 65%. The left ventricle has normal function. The left ventricle has no regional wall motion abnormalities. The left ventricular internal cavity size was normal in size. There is  no left ventricular hypertrophy. Left ventricular diastolic parameters are consistent with Grade III diastolic dysfunction (restrictive). Right Ventricle: The right ventricular size is mildly enlarged. No increase in right ventricular wall thickness. Right ventricular systolic function is low normal. There is severely elevated pulmonary artery systolic pressure. The tricuspid regurgitant velocity is 3.75 m/s, and with an assumed right atrial pressure of 8 mmHg, the estimated right ventricular systolic pressure is 89.3 mmHg. Left Atrium: Left atrial size was mildly dilated. Right Atrium: Right atrial size was normal in size. Pericardium: There is no evidence of pericardial effusion. Mitral Valve: The mitral valve is grossly normal. Trivial mitral valve regurgitation. Tricuspid Valve: The tricuspid valve is grossly normal. Tricuspid valve regurgitation is moderate to severe. Aortic Valve: The aortic valve is tricuspid. Aortic valve regurgitation is not visualized. Pulmonic Valve: The pulmonic valve was grossly normal. Pulmonic valve regurgitation is not visualized. Aorta: The aortic root and ascending aorta are structurally normal, with no evidence of dilitation. Venous: The inferior vena cava is dilated in size with greater than 50% respiratory variability, suggesting right atrial pressure of 8 mmHg. IAS/Shunts: The interatrial  septum was not assessed.  LEFT VENTRICLE PLAX 2D LVIDd:         3.70 cm   Diastology LVIDs:         2.50 cm   LV e' medial:    6.89 cm/s LV PW:         0.80 cm   LV E/e' medial:  15.5 LV IVS:        0.60 cm   LV e' lateral:   9.89 cm/s LVOT diam:     2.00 cm   LV E/e' lateral: 10.8 LV SV:         45 LV SV Index:   34 LVOT Area:     3.14 cm  RIGHT VENTRICLE RV S prime:     9.16 cm/s TAPSE (M-mode): 1.7 cm LEFT ATRIUM             Index        RIGHT ATRIUM           Index LA diam:        3.00 cm 2.26 cm/m   RA Area:     10.70 cm LA Vol (A2C):   26.2 ml 19.73 ml/m  RA Volume:   24.70 ml  18.60 ml/m LA Vol (A4C):   26.4 ml 19.88 ml/m LA Biplane Vol: 27.9 ml 21.01 ml/m  AORTIC VALVE LVOT Vmax:   71.00 cm/s LVOT Vmean:  41.800 cm/s LVOT VTI:    0.143 m  AORTA Ao Root diam: 2.90 cm Ao Asc diam:  3.10 cm MITRAL VALVE                TRICUSPID VALVE MV Area (PHT): 4.89 cm     TR Peak grad:   56.2 mmHg MV Decel Time: 155 msec     TR Vmax:        375.00 cm/s MV E velocity: 107.00 cm/s MV A velocity: 43.30 cm/s   SHUNTS MV E/A ratio:  2.47         Systemic VTI:  0.14 m                             Systemic Diam: 2.00 cm Manish Patwardhan MD Electronically signed by Vernell Leep MD Signature Date/Time: 05/11/2021/1:00:37 PM    Final         Scheduled Meds:  chlorhexidine  15 mL Mouth Rinse BID   Chlorhexidine Gluconate Cloth  6 each Topical Daily   ipratropium-albuterol  3 mL Nebulization Q4H   lactulose  300 mL Rectal Daily   mouth rinse  15 mL Mouth Rinse q12n4p   predniSONE  40 mg Oral Q breakfast   sodium chloride flush  3 mL Intravenous Q12H   sodium chloride flush  3 mL Intravenous Q12H   Continuous Infusions:  sodium chloride     cefTRIAXone (ROCEPHIN)  IV Stopped (05/12/21 0330)   dexmedetomidine (PRECEDEX) IV infusion 0.5 mcg/kg/hr (05/12/21 1200)   heparin 1,000 Units/hr (05/12/21 0600)     LOS: 2 days    Georgette Shell, MD  05/12/2021, 12:59 PM

## 2021-05-12 NOTE — Evaluation (Signed)
Clinical/Bedside Swallow Evaluation Patient Details  Name: [name] MRN: [id number] Date of Birth: [birth year]  Today's Date: 05/12/2021 Time: SLP Start Time (ACUTE ONLY): 1434 SLP Stop Time (ACUTE ONLY): 1445 SLP Time Calculation (min) (ACUTE ONLY): 11 min  Past Medical History:  Past Medical History:  Diagnosis Date   Allergies    being around corn & grain causes itchy eyes, nasal congestion. He can eat these with no problems.   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, s/p SBRT   Pancreatitis    x2 stents placed to make patent duct to pancreas   Pulmonary hypertension (HCC)    PASP 48 mmHg 03/26/18 (echo, [facility])   Severe claudication ([place])    Status post left foot surgery    Tobacco abuse    Past Surgical History:  Past Surgical History:  Procedure Laterality Date   ABDOMINAL AORTOGRAM W/LOWER EXTREMITY N/A 03/30/2018   Procedure: ABDOMINAL AORTOGRAM W/LOWER EXTREMITY;  Surgeon: Nigel Mormon, MD;  Location: [facility];  Service: Cardiovascular;  Laterality: N/A;   BRONCHIAL BIOPSY  04/23/2021   Procedure: BRONCHIAL BIOPSIES;  Surgeon: Garner Nash, DO;  Location: [facility];  Service: Pulmonary;;   BRONCHIAL BRUSHINGS  04/23/2021   Procedure: BRONCHIAL BRUSHINGS;  Surgeon: Garner Nash, DO;  Location: [facility];  Service: Pulmonary;;   BRONCHIAL NEEDLE ASPIRATION BIOPSY  04/23/2021   Procedure: BRONCHIAL NEEDLE ASPIRATION BIOPSIES;  Surgeon: Garner Nash, DO;  Location: [facility];  Service: Pulmonary;;   COLECTOMY     COLOSTOMY     COLOSTOMY REVERSAL     EUS N/A 12/14/2014   Procedure: UPPER ENDOSCOPIC ULTRASOUND (EUS) LINEAR;  Surgeon: Milus Banister, MD;   Location: [facility];  Service: Endoscopy;  Laterality: N/A;   FIDUCIAL MARKER PLACEMENT  04/23/2021   Procedure: FIDUCIAL MARKER PLACEMENT;  Surgeon: Garner Nash, DO;  Location: [facility];  Service: Pulmonary;;   FOOT SURGERY Left 2006   INGUINAL HERNIA REPAIR Left 06/18/2018   Procedure: OPEN RECURRENT LEFT INGUINAL HERNIA REPAIR WITH MESH;  Surgeon: Armandina Gemma, MD;  Location: [facility];  Service: General;  Laterality: Left;   INSERTION OF MESH Left 06/18/2018   Procedure: INSERTION OF MESH;  Surgeon: Armandina Gemma, MD;  Location: [facility];  Service: General;  Laterality: Left;   LAPAROTOMY N/A 06/30/2018   Procedure: EXPLORATORY LAPAROTOMY LYSISIS OF ADHESIONS;  Surgeon: Jovita Kussmaul, MD;  Location: [facility];  Service: General;  Laterality: N/A;   PANCREAS SURGERY  2007   PERIPHERAL VASCULAR INTERVENTION  03/30/2018   Procedure: PERIPHERAL VASCULAR INTERVENTION;  Surgeon: Nigel Mormon, MD;  Location: [facility];  Service: Cardiovascular;;  Rt Iliac   UPPER GI ENDOSCOPY     VENTRAL HERNIA REPAIR  1967   VIDEO BRONCHOSCOPY WITH RADIAL ENDOBRONCHIAL ULTRASOUND  04/23/2021   Procedure: VIDEO BRONCHOSCOPY WITH RADIAL ENDOBRONCHIAL ULTRASOUND;  Surgeon: Garner Nash, DO;  Location: [facility];  Service: Pulmonary;;   HPI:  70 year old male with history of lung CA non-small cell lung cancer in the left upper lobe and right lower lobe diagnosed 05/01/2021, end-stage COPD hypertension PAD anxiety admitted with hypoxia and confusion.  Patient lives at home with his  significant other who called EMS as patient was more confused with cough decreased appetite and fatigue.  Patient was saturating in the 60s on room air with EMS.  Work-up in the ED revealed CT angiogram of the chest with single chronic appearing pulmonary embolism and right lower lobe bilateral changes consistent with known lung carcinoma.    Assessment / Plan / Recommendation  Clinical Impression  Pt presents with  functional swallowing as assessed clincially. Pt tolerated all consistencies trialed including serial straw sips of thin liquid without any clinical s/s of aspiration.  Pt acheived good oral clearance with solids despite edentulism and independently used liquid wash.  Pt states that he does not typically wear dentures 2/2 poor fit.  Pt has remote hx pharyngeal dysphagia in 2020 with silent aspiration noted on MBSS at that time.  Previous dysphagia attributable to preceeding intubation.  Pt denies any current swallowing difficulties.  Discussed diet preference with pt who would like to continue mechanical soft diet at this time.  Pt may advance to regular solids if he would like.    Recommend continuing mechanical soft solids with thin liquids.  Pt has no further ST needs at this time, SLP will sign off.   SLP Visit Diagnosis: Dysphagia, unspecified (R13.10)    Aspiration Risk  No limitations    Diet Recommendation Dysphagia 3 (Mech soft);Thin liquid   Liquid Administration via: Cup;Straw Medication Administration: Whole meds with liquid Supervision: Patient able to self feed Compensations: Slow rate;Small sips/bites Postural Changes: Seated upright at 90 degrees    Other  Recommendations Oral Care Recommendations: Oral care BID    Recommendations for follow up therapy are one component of a multi-disciplinary discharge planning process, led by the attending physician.  Recommendations may be updated based on patient status, additional functional criteria and insurance authorization.  Follow up Recommendations No SLP follow up      Assistance Recommended at Discharge None  Functional Status Assessment Patient has not had a recent decline in their functional status  Frequency and Duration  (N/A)          Prognosis Prognosis for Safe Diet Advancement:  (N/A)      Swallow Study   General Date of Onset: 05/10/21 HPI: 70 year old male with history of lung CA non-small cell lung  cancer in the left upper lobe and right lower lobe diagnosed 05/01/2021, end-stage COPD hypertension PAD anxiety admitted with hypoxia and confusion.  Patient lives at home with his significant other who called EMS as patient was more confused with cough decreased appetite and fatigue.  Patient was saturating in the 60s on room air with EMS.  Work-up in the ED revealed CT angiogram of the chest with single chronic appearing pulmonary embolism and right lower lobe bilateral changes consistent with known lung carcinoma. Type of Study: Bedside Swallow Evaluation Previous Swallow Assessment: MBSS 2020 Diet Prior to this Study: Dysphagia 3 (soft);Thin liquids Temperature Spikes Noted: No Respiratory Status: Nasal cannula History of Recent Intubation: No Behavior/Cognition: Alert;Cooperative;Pleasant mood Oral Cavity Assessment: Within Functional Limits Oral Care Completed by SLP: No Oral Cavity - Dentition: Edentulous Vision: Functional for self-feeding Self-Feeding Abilities: Able to feed self Patient Positioning: Upright in bed Baseline Vocal Quality: Normal Volitional Cough: Strong    Oral/Motor/Sensory Function Overall Oral Motor/Sensory Function: Within functional limits Facial ROM: Within Functional Limits Facial Symmetry: Within Functional Limits Lingual ROM: Within Functional Limits Lingual Symmetry: Within Functional Limits Lingual Strength: Within Functional Limits Velum: Within Functional Limits Mandible: Within Functional Limits  Ice Chips Ice chips: Not tested   Thin Liquid Thin Liquid: Within functional limits Presentation: Cup;Straw    Nectar Thick Nectar Thick Liquid: Not tested   Honey Thick Honey Thick Liquid: Not tested   Puree Puree: Within functional limits Presentation: Spoon   Solid     Solid: Within functional limits Presentation: Self Fed Other Comments: used liquid wash independently      Celedonio Savage, [place], [place] Office: [phone] 05/12/2021,2:58 PM

## 2021-05-13 ENCOUNTER — Institutional Professional Consult (permissible substitution): Payer: Medicare Other | Admitting: Pulmonary Disease

## 2021-05-13 DIAGNOSIS — J9601 Acute respiratory failure with hypoxia: Secondary | ICD-10-CM | POA: Diagnosis not present

## 2021-05-13 DIAGNOSIS — J9602 Acute respiratory failure with hypercapnia: Secondary | ICD-10-CM | POA: Diagnosis not present

## 2021-05-13 LAB — BLOOD GAS, ARTERIAL
Acid-Base Excess: 6.9 mmol/L — ABNORMAL HIGH (ref 0.0–2.0)
Bicarbonate: 32.8 mmol/L — ABNORMAL HIGH (ref 20.0–28.0)
O2 Saturation: 93.4 %
Patient temperature: 97.9
pCO2 arterial: 54.3 mmHg — ABNORMAL HIGH (ref 32.0–48.0)
pH, Arterial: 7.398 (ref 7.350–7.450)
pO2, Arterial: 71.8 mmHg — ABNORMAL LOW (ref 83.0–108.0)

## 2021-05-13 LAB — BASIC METABOLIC PANEL
Anion gap: 7 (ref 5–15)
BUN: 52 mg/dL — ABNORMAL HIGH (ref 8–23)
CO2: 33 mmol/L — ABNORMAL HIGH (ref 22–32)
Calcium: 9 mg/dL (ref 8.9–10.3)
Chloride: 93 mmol/L — ABNORMAL LOW (ref 98–111)
Creatinine, Ser: 1.01 mg/dL (ref 0.61–1.24)
GFR, Estimated: >60 mL/min (ref >60)
Glucose, Bld: 157 mg/dL — ABNORMAL HIGH (ref 70–99)
Potassium: 3.1 mmol/L — ABNORMAL LOW (ref 3.5–5.1)
Sodium: 133 mmol/L — ABNORMAL LOW (ref 135–145)

## 2021-05-13 LAB — CBC
HCT: 34 % — ABNORMAL LOW (ref 39.0–52.0)
Hemoglobin: 11.6 g/dL — ABNORMAL LOW (ref 13.0–17.0)
MCH: 31.5 pg (ref 26.0–34.0)
MCHC: 34.1 g/dL (ref 30.0–36.0)
MCV: 92.4 fL (ref 80.0–100.0)
Platelets: 362 10*3/uL (ref 150–400)
RBC: 3.68 MIL/uL — ABNORMAL LOW (ref 4.22–5.81)
RDW: 13.9 % (ref 11.5–15.5)
WBC: 17.2 10*3/uL — ABNORMAL HIGH (ref 4.0–10.5)
nRBC: 0 % (ref 0.0–0.2)

## 2021-05-13 LAB — PHOSPHORUS: Phosphorus: 1.7 mg/dL — ABNORMAL LOW (ref 2.5–4.6)

## 2021-05-13 LAB — HEPARIN LEVEL (UNFRACTIONATED): Heparin Unfractionated: 0.35 IU/mL (ref 0.30–0.70)

## 2021-05-13 LAB — MAGNESIUM: Magnesium: 2.2 mg/dL (ref 1.7–2.4)

## 2021-05-13 LAB — OSMOLALITY, URINE: Osmolality, Ur: 411 mOsm/kg (ref 300–900)

## 2021-05-13 LAB — LEGIONELLA PNEUMOPHILA SEROGP 1 UR AG: L. pneumophila Serogp 1 Ur Ag: NEGATIVE

## 2021-05-13 MED ORDER — AMOXICILLIN-POT CLAVULANATE 500-125 MG PO TABS
500.0000 mg | ORAL_TABLET | Freq: Two times a day (BID) | ORAL | Status: DC
  Administered 2021-05-13 – 2021-05-16 (×7): 500 mg via ORAL
  Filled 2021-05-13 (×10): qty 1

## 2021-05-13 MED ORDER — APIXABAN 5 MG PO TABS: 5.0000 mg | ORAL_TABLET | Freq: Two times a day (BID) | ORAL | Status: DC

## 2021-05-13 MED ORDER — APIXABAN 5 MG PO TABS
10.0000 mg | ORAL_TABLET | Freq: Two times a day (BID) | ORAL | Status: DC
  Administered 2021-05-13 – 2021-05-16 (×7): 10 mg via ORAL
  Filled 2021-05-13 (×7): qty 2

## 2021-05-13 MED ORDER — METOPROLOL SUCCINATE ER 25 MG PO TB24
25.0000 mg | ORAL_TABLET | Freq: Every day | ORAL | Status: DC
  Administered 2021-05-13 – 2021-05-16 (×4): 25 mg via ORAL
  Filled 2021-05-13 (×4): qty 1

## 2021-05-13 MED ORDER — UMECLIDINIUM-VILANTEROL 62.5-25 MCG/ACT IN AEPB
1.0000 | INHALATION_SPRAY | Freq: Every day | RESPIRATORY_TRACT | Status: DC
  Administered 2021-05-14 – 2021-05-16 (×3): 1 via RESPIRATORY_TRACT
  Filled 2021-05-13: qty 14

## 2021-05-13 MED ORDER — IPRATROPIUM-ALBUTEROL 0.5-2.5 (3) MG/3ML IN SOLN: 3.0000 mL | RESPIRATORY_TRACT | Status: DC | PRN

## 2021-05-13 MED ORDER — PROSOURCE PLUS PO LIQD
30.0000 mL | Freq: Three times a day (TID) | ORAL | Status: DC
  Administered 2021-05-13 – 2021-05-16 (×4): 30 mL via ORAL
  Filled 2021-05-13 (×4): qty 30

## 2021-05-13 MED ORDER — BUPROPION HCL ER (XL) 150 MG PO TB24
150.0000 mg | ORAL_TABLET | Freq: Every morning | ORAL | Status: DC
  Administered 2021-05-13 – 2021-05-16 (×4): 150 mg via ORAL
  Filled 2021-05-13 (×4): qty 1

## 2021-05-13 MED ORDER — ADULT MULTIVITAMIN W/MINERALS CH
1.0000 | ORAL_TABLET | Freq: Every day | ORAL | Status: DC
  Administered 2021-05-13 – 2021-05-16 (×4): 1 via ORAL
  Filled 2021-05-13 (×4): qty 1

## 2021-05-13 MED ORDER — POTASSIUM PHOSPHATES 15 MMOLE/5ML IV SOLN
30.0000 mmol | Freq: Once | INTRAVENOUS | Status: AC
  Administered 2021-05-13: 30 mmol via INTRAVENOUS
  Filled 2021-05-13: qty 10

## 2021-05-13 NOTE — Plan of Care (Signed)
  Problem: Activity: Goal: Risk for activity intolerance will decrease Outcome: Progressing   Problem: Coping: Goal: Level of anxiety will decrease Outcome: Progressing   

## 2021-05-13 NOTE — Discharge Instructions (Signed)
Information on my medicine - ELIQUIS (apixaban)  Why was Eliquis prescribed for you? Eliquis was prescribed to treat blood clots that may have been found in the veins of your legs (deep vein thrombosis) or in your lungs (pulmonary embolism) and to reduce the risk of them occurring again.  What do You need to know about Eliquis ? The starting dose is 10 mg (two 5 mg tablets) taken TWICE daily for the FIRST SIX (6) DAYS, then on 05/19/2021 the dose is reduced to ONE 5 mg tablet taken TWICE daily.  Eliquis may be taken with or without food.   Try to take the dose about the same time in the morning and in the evening. If you have difficulty swallowing the tablet whole please discuss with your pharmacist how to take the medication safely.  Take Eliquis exactly as prescribed and DO NOT stop taking Eliquis without talking to the doctor who prescribed the medication.  Stopping may increase your risk of developing a new blood clot.  Refill your prescription before you run out.  After discharge, you should have regular check-up appointments with your healthcare provider that is prescribing your Eliquis.    What do you do if you miss a dose? If a dose of ELIQUIS is not taken at the scheduled time, take it as soon as possible on the same day and twice-daily administration should be resumed. The dose should not be doubled to make up for a missed dose.  Important Safety Information A possible side effect of Eliquis is bleeding. You should call your healthcare provider right away if you experience any of the following: Bleeding from an injury or your nose that does not stop. Unusual colored urine (red or dark brown) or unusual colored stools (red or black). Unusual bruising for unknown reasons. A serious fall or if you hit your head (even if there is no bleeding).  Some medicines may interact with Eliquis and might increase your risk of bleeding or clotting while on Eliquis. To help avoid this,  consult your healthcare provider or pharmacist prior to using any new prescription or non-prescription medications, including herbals, vitamins, non-steroidal anti-inflammatory drugs (NSAIDs) and supplements.  This website has more information on Eliquis (apixaban): http://[email or web address].com/eliquis/home

## 2021-05-13 NOTE — Evaluation (Signed)
Physical Therapy Evaluation Patient Details Name: [name] MRN: [id number] DOB: 25-Sep-1950 Today's Date: 05/13/2021  History of Present Illness  70 year old male with history of lung CA non-small cell lung cancer in the left upper lobe and right lower lobe diagnosed 05/01/2021, end-stage COPD hypertension PAD anxiety admitted with hypoxia and confusion.  Patient lives at home with his significant other who called EMS as patient was more confused with cough decreased appetite and fatigue.  Patient was saturating in the 60s on room air with EMS.  Work-up in the ED revealed CT angiogram of the chest with single chronic appearing pulmonary embolism and right lower lobe bilateral changes consistent with known lung carcinoma.  Clinical Impression  Pt admitted with above diagnosis.  Pt pleasant and motivated to be OOB. Limited by fatigue and DOE. O2 saturations in 80s with transfer to chair .  Will likely need HHPT at d/c, pending progress in acute setting  Pt currently with functional limitations due to the deficits listed below (see PT Problem List). Pt will benefit from skilled PT to increase their independence and safety with mobility to allow discharge to the venue listed below.          Recommendations for follow up therapy are one component of a multi-disciplinary discharge planning process, led by the attending physician.  Recommendations may be updated based on patient status, additional functional criteria and insurance authorization.  Follow Up Recommendations Home health PT    Assistance Recommended at Discharge Intermittent Supervision/Assistance  Functional Status Assessment Patient has had a recent decline in their functional status and demonstrates the ability to make significant improvements in function in a reasonable and predictable amount of time.  Equipment Recommendations  None recommended by PT    Recommendations for Other Services       Precautions / Restrictions  Precautions Precautions: Fall Restrictions Weight Bearing Restrictions: No      Mobility  Bed Mobility Overal bed mobility: Needs Assistance Bed Mobility: Supine to Sit     Supine to sit: Supervision     General bed mobility comments: for safety and lines, dizzy on initial sitting. after one minute dizziness resolved    Transfers Overall transfer level: Needs assistance Equipment used: None Transfers: Sit to/from Stand;Bed to chair/wheelchair/BSC Sit to Stand: Min guard;Min assist   Step pivot transfers: Min assist       General transfer comment: assist to rise and steady and take pivotal steps to chair    Ambulation/Gait               General Gait Details: deferred d/t DOE and lunch arrived  [employer] Mobility    Modified Rankin (Stroke Patients Only)       Balance Overall balance assessment: Needs assistance Sitting-balance support: Feet supported;No upper extremity supported Sitting balance-Leahy Scale: Good       Standing balance-Leahy Scale: Fair                               Pertinent Vitals/Pain Pain Assessment: No/denies pain    Home Living Family/patient expects to be discharged to:: Private residence Living Arrangements: Spouse/significant other Available Help at Discharge: Family Type of Home: House Home Access: Stairs to enter   Technical brewer of Steps: 2   Home Layout: One level Home Equipment: Rollator (4 wheels);Shower seat;Wheelchair - manual Additional Comments: significant other is HHRN and works Thursday  through Sunday    Prior Function Prior Level of Function : Independent/Modified Independent                     Hand Dominance        Extremity/Trunk Assessment   Upper Extremity Assessment Upper Extremity Assessment: Generalized weakness    Lower Extremity Assessment Lower Extremity Assessment: Generalized weakness       Communication    Communication: No difficulties  Cognition Arousal/Alertness: Awake/alert Behavior During Therapy: WFL for tasks assessed/performed Overall Cognitive Status: Within Functional Limits for tasks assessed                                          General Comments      Exercises     Assessment/Plan    PT Assessment Patient needs continued PT services  PT Problem List Decreased strength;Decreased mobility;Decreased activity tolerance;Decreased balance       PT Treatment Interventions DME instruction;Therapeutic activities;Gait training;Functional mobility training;Therapeutic exercise;Stair training;Patient/family education    PT Goals (Current goals can be found in the Care Plan section)  Acute Rehab PT Goals Patient Stated Goal: get strong enough to go home PT Goal Formulation: With patient/family Time For Goal Achievement: 05/27/21 Potential to Achieve Goals: Fair    Frequency Min 3X/week   Barriers to discharge        Co-evaluation               AM-PAC PT "6 Clicks" Mobility  Outcome Measure Help needed turning from your back to your side while in a flat bed without using bedrails?: A Little Help needed moving from lying on your back to sitting on the side of a flat bed without using bedrails?: A Little Help needed moving to and from a bed to a chair (including a wheelchair)?: A Little Help needed standing up from a chair using your arms (e.g., wheelchair or bedside chair)?: A Little Help needed to walk in hospital room?: A Little Help needed climbing 3-5 steps with a railing? : A Lot 6 Click Score: 17    End of Session   Activity Tolerance: Patient tolerated treatment well Patient left: in chair;with call bell/phone within reach;with chair alarm set;with family/visitor present Nurse Communication: Mobility status PT Visit Diagnosis: Other abnormalities of gait and mobility (R26.89)    Time: 4196-2229 PT Time Calculation (min) (ACUTE ONLY):  17 min   Charges:   PT Evaluation $PT Eval Low Complexity: [place], PT  Acute Rehab Dept ([place]) [phone] Pager [phone]  05/13/2021   [facility] 05/13/2021, 1:26 PM

## 2021-05-13 NOTE — Progress Notes (Addendum)
PROGRESS NOTE    [name]  XHB:[id number] DOB: January 05, 1951 DOA: 05/10/2021 PCP: Janie Morning, DO   Brief Narrative: 70 year old male with history of lung CA non-small cell lung cancer in the left upper lobe and right lower lobe diagnosed 05/01/2021, end-stage COPD hypertension PAD anxiety admitted with hypoxia and confusion.  Patient lives at home with his significant other who called EMS as patient was more confused with cough decreased appetite and fatigue.  Patient was saturating in the 60s on room air with EMS.  Work-up in the ED revealed CT angiogram of the chest with single chronic appearing pulmonary embolism and right lower lobe bilateral changes consistent with known lung carcinoma.  His sodium was 125.  BNP was elevated at 1941.  Lactic acid was normal troponin was normal.  Patient was started on IV heparin and Lasix in the ED. ABG 7.2 3/62/169 from 7.1 4/87/277  Assessment & Plan:   Principal Problem:   Acute respiratory failure with hypoxia and hypercarbia (HCC) Active Problems:   Hyponatremia   COPD with acute exacerbation (HCC)   Essential hypertension   PAD (peripheral artery disease) (HCC)   Acute metabolic encephalopathy   Pulmonary embolism (HCC)   Lung cancer (HCC)   #1 acute on chronic hypoxic/hypercapnic respiratory failure secondary to acute COPD exacerbation in the setting of lung cancer and severe COPD, CHF with elevated BNP and chronic PE.  Continue to use BiPAP nightly and as needed. Continue Lasix. Taper steroids as tolerated. Start Eliquis and DC heparin. Chest x-ray 05/12/2021 with new left upper lobe infiltrates.  Start Augmentin 05/13/2021. Patient appears more awake and alert.  Still not back to his baseline per his wife.  Echocardiogram NL EF GRADE 3 DD    #2 severe end-stage COPD-patient presents with acute on chronic hypoxic/hypercapnic respiratory failure due to severe COPD.  The use of NIV will treat patient's high PCO2 levels(54.2 with  elevated bicarbonate of 32.8 while using BiPAP during current hospital stay) and can reduce the risk of exacerbations and future hospitalizations when used at night and during the day.  He is readmitted less than 1 month following his last hospitalization for elective surgery.  BiPAP has been proven ineffective to provide essential volume control necessary to maintain acceptable CO2 levels as ABGs demonstrate consistent hypercapnia despite best efforts with using BiPAP.  An NIV with IVAPS is necessary to prevent patient from life-threatening harm. Interruption or failure to provide NIV would quickly lead to exacerbation of   Patient's condition, hospital re admissions and likely harm to the patient.  Furthermore adapt health breathe a little easier program has shown an 85.1 readmission reduction locally for COPD patients.  Continued use is preferred.  Patient is able to protect their airway and clear secretions on their own. He will need oxygen at home on discharge.  #3 pulmonary embolism appears chronic in the setting of malignancy starting Eliquis.  #4 acute metabolic encephalopathy improving.  Multifactorial secondary to hypoxia hypercapnia elevated ammonia   #5 history of lung CA has outpatient follow-up with Dr. Tammi Klippel for radiation to new lesions.  Follows up with Dr. Valeta Harms.  #6  Hyper volemic hyponatremia sodium improved with diuresis.    #7 polysubstance abuse check urine drug screen positive for THC  #8 elevated ammonia unclear why continue lactulose check ammonia level.    #9 history of essential hypertension was on Norvasc and Toprol at home.  Restart Toprol.  Continue Lasix.  #10 goals of care-discussed with daughter healthcare POA patient is  DNR DNI.  Estimated body mass index is 15.52 kg/m as calculated from the following:   Height as of this encounter: 5\' 2"  (1.575 m).   Weight as of this encounter: 38.5 kg.  DVT prophylaxis:heparin Code Status:full Family Communication: dw  significant other/unable to reach daughter multiple attempts Disposition Plan:  Status is: Inpatient  Remains inpatient appropriate because: bipap resp failure   Consultants:  pccm  Procedures:none Antimicrobials: none Subjective: Patient resting in bed he was moved out of the ICU yesterday He is not sure if he used BiPAP overnight.  He is awake and alert. Objective: Vitals:   05/12/21 2319 05/13/21 0018 05/13/21 0546 05/13/21 0739  BP: (!) 163/92  (!) 158/85   Pulse: (!) 104 (!) 103 92   Resp: 17 18 20    Temp: 97.9 F (36.6 C)  97.8 F (36.6 C)   TempSrc: Oral  Oral   SpO2: 96% 95% 93% 93%  Weight:      Height:        Intake/Output Summary (Last 24 hours) at 05/13/2021 1204 Last data filed at 05/13/2021 0730 Gross per 24 hour  Intake 192.63 ml  Output 1375 ml  Net -1182.37 ml    Filed Weights   05/10/21 1956  Weight: 38.5 kg    Examination:  General exam: Appears much more awake answering some questions appropriately Appears frail chronically ill Respiratory system: Decreased breath sounds on to auscultation. Respiratory effort normal. Cardiovascular system: S1 & S2 heard, RRR. No JVD, murmurs, rubs, gallops or clicks. No pedal edema. Gastrointestinal system: Abdomen is nondistended, soft and nontender. No organomegaly or masses felt. Normal bowel sounds heard. Central nervous system: Awake Extremities: No edema Skin: No rashes, lesions or ulcers Psychiatry:unable to assess   Data Reviewed: I have personally reviewed following labs and imaging studies  CBC: Recent Labs  Lab 05/10/21 2030 05/11/21 0745 05/12/21 0023 05/13/21 0337  WBC 5.9 5.9 13.6* 17.2*  NEUTROABS 4.4  --   --   --   HGB 11.1* 11.3* 11.0* 11.6*  HCT 33.2* 34.9* 32.1* 34.0*  MCV 94.6 99.1 92.8 92.4  PLT 279 260 295 767    Basic Metabolic Panel: Recent Labs  Lab 05/10/21 2030 05/11/21 0745 05/12/21 0023 05/13/21 0337  NA 125* 125* 130* 133*  K 4.5 4.1 3.3* 3.1*  CL 87* 90*  91* 93*  CO2 28 23 28  33*  GLUCOSE 130* 163* 172* 157*  BUN 39* 40* 43* 52*  CREATININE 1.09 1.17 0.95 1.01  CALCIUM 9.1 8.8* 8.5* 9.0  MG  --   --  2.5* 2.2  PHOS  --   --  4.4 1.7*    GFR: Estimated Creatinine Clearance: 37.6 mL/min (by C-G formula based on SCr of 1.01 mg/dL). Liver Function Tests: Recent Labs  Lab 05/10/21 2030 05/11/21 0745  AST 51* 45*  ALT 38 36  ALKPHOS 101 108  BILITOT 0.5 0.5  PROT 7.8 7.2  ALBUMIN 3.5 3.2*    Recent Labs  Lab 05/10/21 2030  LIPASE 25    Recent Labs  Lab 05/11/21 0745  AMMONIA 76*    Coagulation Profile: Recent Labs  Lab 05/11/21 0000  INR 1.1    Cardiac Enzymes: No results for input(s): CKTOTAL, CKMB, CKMBINDEX, TROPONINI in the last 168 hours. BNP (last 3 results) No results for input(s): PROBNP in the last 8760 hours. HbA1C: No results for input(s): HGBA1C in the last 72 hours. CBG: No results for input(s): GLUCAP in the last 168 hours. Lipid  Profile: No results for input(s): CHOL, HDL, LDLCALC, TRIG, CHOLHDL, LDLDIRECT in the last 72 hours. Thyroid Function Tests: Recent Labs    05/11/21 0745  TSH 4.039    Anemia Panel: No results for input(s): VITAMINB12, FOLATE, FERRITIN, TIBC, IRON, RETICCTPCT in the last 72 hours. Sepsis Labs: Recent Labs  Lab 05/10/21 2030 05/11/21 0000  LATICACIDVEN 0.9 0.7     Recent Results (from the past 240 hour(s))  Blood culture (routine x 2)     Status: None (Preliminary result)   Collection Time: 05/10/21  8:30 PM   Specimen: BLOOD  Result Value Ref Range Status   Specimen Description   Final    BLOOD RIGHT ANTECUBITAL Performed at [facility] [address] [place], [place] [address]    Special Requests   Final    BOTTLES DRAWN AEROBIC AND ANAEROBIC Blood Culture results may not be optimal due to an excessive volume of blood received in culture bottles Performed at [facility] [address] [place], [place]  [address]    Culture   Final    NO GROWTH 2 DAYS Performed at [facility] [address] [place], [place] [address]    Report Status PENDING  Incomplete  Blood culture (routine x 2)     Status: None (Preliminary result)   Collection Time: 05/10/21  8:30 PM   Specimen: BLOOD  Result Value Ref Range Status   Specimen Description   Final    BLOOD LEFT ANTECUBITAL Performed at [facility] [address], [place], [place] [address]    Special Requests   Final    BOTTLES DRAWN AEROBIC AND ANAEROBIC Blood Culture results may not be optimal due to an excessive volume of blood received in culture bottles Performed at [facility] [address] [place], [place] [address]    Culture   Final    NO GROWTH 2 DAYS Performed at [facility] [address] [place], [place] [address]    Report Status PENDING  Incomplete  Resp Panel by RT-PCR (Flu A&B, Covid)     Status: None   Collection Time: 05/10/21  8:30 PM  Result Value Ref Range Status   SARS Coronavirus 2 by RT PCR NEGATIVE NEGATIVE Final    Comment: (NOTE) SARS-CoV-2 target nucleic acids are NOT DETECTED.  The SARS-CoV-2 RNA is generally detectable in upper respiratory specimens during the acute phase of infection. The lowest concentration of SARS-CoV-2 viral copies this assay can detect is 138 copies/mL. A negative result does not preclude SARS-Cov-2 infection and should not be used as the sole basis for treatment or other patient management decisions. A negative result may occur with  improper specimen collection/handling, submission of specimen other than nasopharyngeal swab, presence of viral mutation(s) within the areas targeted by this assay, and inadequate number of viral copies(<138 copies/mL). A negative result must be combined with clinical observations, patient history, and epidemiological information. The expected result is Negative.  Fact Sheet for Patients:   EntrepreneurPulse.com.au  Fact Sheet for Healthcare Providers:  IncredibleEmployment.be  This test is no t yet approved or cleared by the [place] FDA and  has been authorized for detection and/or diagnosis of SARS-CoV-2 by FDA under an Emergency Use Authorization (EUA). This EUA will remain  in effect (meaning this test can be used) for the duration of the COVID-19 declaration under Section 564(b)(1) of the Act, 21 U.S.C.section 360bbb-3(b)(1), unless the authorization is terminated  or revoked sooner.       Influenza A by PCR NEGATIVE NEGATIVE Final   Influenza B by PCR NEGATIVE NEGATIVE Final    Comment: (NOTE) The Xpert Xpress SARS-CoV-2/FLU/RSV plus assay is intended as an aid in the diagnosis of influenza from Nasopharyngeal swab specimens and should not be used as a sole basis for treatment. Nasal washings and aspirates are unacceptable for Xpert Xpress SARS-CoV-2/FLU/RSV testing.  Fact Sheet for Patients: EntrepreneurPulse.com.au  Fact Sheet for Healthcare Providers: IncredibleEmployment.be  This test is not yet approved or cleared by the [place] FDA and has been authorized for detection and/or diagnosis of SARS-CoV-2 by FDA under an Emergency Use Authorization (EUA). This EUA will remain in effect (meaning this test can be used) for the duration of the COVID-19 declaration under Section 564(b)(1) of the Act, 21 U.S.C. section 360bbb-3(b)(1), unless the authorization is terminated or revoked.  Performed at [facility], [place] [address], [place], [place] [address]   MRSA Next Gen by PCR, Nasal     Status: None   Collection Time: 05/11/21  1:53 AM   Specimen: Nasal Mucosa; Nasal Swab  Result Value Ref Range Status   MRSA by PCR Next Gen NOT DETECTED NOT DETECTED Final    Comment: (NOTE) The GeneXpert MRSA Assay (FDA approved for NASAL specimens only), is one  component of a comprehensive MRSA colonization surveillance program. It is not intended to diagnose MRSA infection nor to guide or monitor treatment for MRSA infections. Test performance is not FDA approved in patients less than 2 years old. Performed at [facility], [place] [address], [place], [place] [address]   Respiratory (~20 pathogens) panel by PCR     Status: None   Collection Time: 05/11/21 10:49 AM   Specimen: Nasopharyngeal Swab; Respiratory  Result Value Ref Range Status   Adenovirus NOT DETECTED NOT DETECTED Final   Coronavirus 229E NOT DETECTED NOT DETECTED Final    Comment: (NOTE) The Coronavirus on the Respiratory Panel, DOES NOT test for the novel  Coronavirus (2019 nCoV)    Coronavirus HKU1 NOT DETECTED NOT DETECTED Final   Coronavirus NL63 NOT DETECTED NOT DETECTED Final   Coronavirus OC43 NOT DETECTED NOT DETECTED Final   Metapneumovirus NOT DETECTED NOT DETECTED Final   Rhinovirus / Enterovirus NOT DETECTED NOT DETECTED Final   Influenza A NOT DETECTED NOT DETECTED Final   Influenza B NOT DETECTED NOT DETECTED Final   Parainfluenza Virus 1 NOT DETECTED NOT DETECTED Final   Parainfluenza Virus 2 NOT DETECTED NOT DETECTED Final   Parainfluenza Virus 3 NOT DETECTED NOT DETECTED Final   Parainfluenza Virus 4 NOT DETECTED NOT DETECTED Final   Respiratory Syncytial Virus NOT DETECTED NOT DETECTED Final   Bordetella pertussis NOT DETECTED NOT DETECTED Final   Bordetella Parapertussis NOT DETECTED NOT DETECTED Final   Chlamydophila pneumoniae NOT DETECTED NOT DETECTED Final   Mycoplasma pneumoniae NOT DETECTED NOT DETECTED Final    Comment: Performed at [facility], [place]. [address], [place], [place] [address]          Radiology Studies: DG Chest 1 View  Result Date: 05/12/2021 CLINICAL DATA:  Hypoxia EXAM: CHEST  1 VIEW COMPARISON:  05/10/2021 FINDINGS: Cardiac shadow is within normal limits. Lungs are well aerated bilaterally.  Increasing airspace opacity in the left upper lobe is noted consistent with acute on chronic infiltrate. No sizable effusion is seen. IMPRESSION: Acute on chronic infiltrate in the left upper lobe. Some degree of volume  loss is noted. Electronically Signed   By: Inez Catalina M.D.   On: 05/12/2021 20:39   ECHOCARDIOGRAM COMPLETE  Result Date: 05/11/2021    ECHOCARDIOGRAM REPORT   Patient Name:   RYLEE NUZUM Date of Exam: 05/11/2021 Medical Rec #:  [id number]       Height:       62.0 in Accession #:    [id number]      Weight:       84.9 lb Date of Birth:  Dec 15, 1950       BSA:          1.328 m Patient Age:    6 years        BP:           127/59 mmHg Patient Gender: M               HR:           74 bpm. Exam Location:  Inpatient Procedure: 2D Echo, Cardiac Doppler and Color Doppler Indications:     Pulmonary Embolus I26.09  History:         Patient has no prior history of Echocardiogram examinations.                  COPD and Pulmonary HTN; Risk Factors:Hypertension and Current                  Smoker.  Sonographer:     Bernadene Person RDCS Referring Phys:  [id number] Ilene Qua OPYD Diagnosing Phys: Vernell Leep MD  Sonographer Comments: Echo performed with patient supine and on artificial respirator. IMPRESSIONS  1. Left ventricular ejection fraction, by estimation, is 60 to 65%. The left ventricle has normal function. The left ventricle has no regional wall motion abnormalities. Left ventricular diastolic parameters are consistent with Grade III diastolic dysfunction (restrictive).  2. Right ventricular systolic function is low normal. The right ventricular size is mildly enlarged. There is severely elevated pulmonary artery systolic pressure. Estimated PASP 64 mmHg.  3. Left atrial size was mildly dilated.  4. The mitral valve is grossly normal. Trivial mitral valve regurgitation.  5. Tricuspid valve regurgitation is moderate to severe.  6. The aortic valve is tricuspid. Aortic valve regurgitation is not  visualized.  7. The inferior vena cava is dilated in size with >50% respiratory variability, suggesting right atrial pressure of 8 mmHg. FINDINGS  Left Ventricle: Left ventricular ejection fraction, by estimation, is 60 to 65%. The left ventricle has normal function. The left ventricle has no regional wall motion abnormalities. The left ventricular internal cavity size was normal in size. There is  no left ventricular hypertrophy. Left ventricular diastolic parameters are consistent with Grade III diastolic dysfunction (restrictive). Right Ventricle: The right ventricular size is mildly enlarged. No increase in right ventricular wall thickness. Right ventricular systolic function is low normal. There is severely elevated pulmonary artery systolic pressure. The tricuspid regurgitant velocity is 3.75 m/s, and with an assumed right atrial pressure of 8 mmHg, the estimated right ventricular systolic pressure is 93.8 mmHg. Left Atrium: Left atrial size was mildly dilated. Right Atrium: Right atrial size was normal in size. Pericardium: There is no evidence of pericardial effusion. Mitral Valve: The mitral valve is grossly normal. Trivial mitral valve regurgitation. Tricuspid Valve: The tricuspid valve is grossly normal. Tricuspid valve regurgitation is moderate to severe. Aortic Valve: The aortic valve is tricuspid. Aortic valve regurgitation is not visualized. Pulmonic Valve: The pulmonic valve was grossly normal. Pulmonic valve regurgitation is  not visualized. Aorta: The aortic root and ascending aorta are structurally normal, with no evidence of dilitation. Venous: The inferior vena cava is dilated in size with greater than 50% respiratory variability, suggesting right atrial pressure of 8 mmHg. IAS/Shunts: The interatrial septum was not assessed.  LEFT VENTRICLE PLAX 2D LVIDd:         3.70 cm   Diastology LVIDs:         2.50 cm   LV e' medial:    6.89 cm/s LV PW:         0.80 cm   LV E/e' medial:  15.5 LV IVS:         0.60 cm   LV e' lateral:   9.89 cm/s LVOT diam:     2.00 cm   LV E/e' lateral: 10.8 LV SV:         45 LV SV Index:   34 LVOT Area:     3.14 cm  RIGHT VENTRICLE RV S prime:     9.16 cm/s TAPSE (M-mode): 1.7 cm LEFT ATRIUM             Index        RIGHT ATRIUM           Index LA diam:        3.00 cm 2.26 cm/m   RA Area:     10.70 cm LA Vol (A2C):   26.2 ml 19.73 ml/m  RA Volume:   24.70 ml  18.60 ml/m LA Vol (A4C):   26.4 ml 19.88 ml/m LA Biplane Vol: 27.9 ml 21.01 ml/m  AORTIC VALVE LVOT Vmax:   71.00 cm/s LVOT Vmean:  41.800 cm/s LVOT VTI:    0.143 m  AORTA Ao Root diam: 2.90 cm Ao Asc diam:  3.10 cm MITRAL VALVE                TRICUSPID VALVE MV Area (PHT): 4.89 cm     TR Peak grad:   56.2 mmHg MV Decel Time: 155 msec     TR Vmax:        375.00 cm/s MV E velocity: 107.00 cm/s MV A velocity: 43.30 cm/s   SHUNTS MV E/A ratio:  2.47         Systemic VTI:  0.14 m                             Systemic Diam: 2.00 cm Manish Patwardhan MD Electronically signed by Vernell Leep MD Signature Date/Time: 05/11/2021/1:00:37 PM    Final         Scheduled Meds:  apixaban  10 mg Oral BID   Followed by   Derrill Memo ON 05/19/2021] apixaban  5 mg Oral BID   buPROPion  150 mg Oral q morning   chlorhexidine  15 mL Mouth Rinse BID   Chlorhexidine Gluconate Cloth  6 each Topical Daily   doxycycline  100 mg Oral Q12H   furosemide  20 mg Intravenous BID   lactulose  20 g Oral BID   lactulose  300 mL Rectal Daily   mouth rinse  15 mL Mouth Rinse q12n4p   metoprolol succinate  25 mg Oral Daily   potassium chloride  10 mEq Oral BID   predniSONE  40 mg Oral Q breakfast   sodium chloride flush  3 mL Intravenous Q12H   sodium chloride flush  3 mL Intravenous Q12H   Continuous Infusions:  sodium chloride  LOS: 3 days    Georgette Shell, MD  05/13/2021, 12:04 PM

## 2021-05-13 NOTE — Progress Notes (Signed)
[place] for apixaban (transition off IV heparin) Indication: pulmonary embolus  Allergies  Allergen Reactions   Prevacid [Lansoprazole] Other (See Comments)    Bloating ("became swollen with gas")   Varenicline Nausea And Vomiting and Other (See Comments)    Vivid, bad dreams also   Ciprofloxacin Other (See Comments)    GI Upset   Mirtazapine Other (See Comments)    Negatively affected sleep   Omeprazole Other (See Comments)    "Gas"   Patient Measurements: Height: 5\' 2"  (157.5 cm) Weight: 38.5 kg (84 lb 14 oz) IBW/kg (Calculated) : 54.6 Heparin Dosing Weight: 38.5kg  Vital Signs: Temp: 97.8 F (36.6 C) (12/05 0546) Temp Source: Oral (12/05 0546) BP: 158/85 (12/05 0546) Pulse Rate: 92 (12/05 0546)  Labs: Recent Labs    05/10/21 2030 05/11/21 0000 05/11/21 0745 05/11/21 1614 05/12/21 0023 05/12/21 0731 05/13/21 0337  HGB 11.1*  --  11.3*  --  11.0*  --  11.6*  HCT 33.2*  --  34.9*  --  32.1*  --  34.0*  PLT 279  --  260  --  295  --  362  APTT  --  32  --   --   --   --   --   LABPROT  --  13.7  --   --   --   --   --   INR  --  1.1  --   --   --   --   --   HEPARINUNFRC  --   --  <0.10*   < > 0.43 0.59 0.35  CREATININE 1.09  --  1.17  --  0.95  --  1.01  TROPONINIHS 12 12  --   --   --   --   --    < > = values in this interval not displayed.    Estimated Creatinine Clearance: 37.6 mL/min (by C-G formula based on SCr of 1.01 mg/dL).  Medical History: Past Medical History:  Diagnosis Date   Allergies    being around corn & grain causes itchy eyes, nasal congestion. He can eat these with no problems.   Anxiety    COPD (chronic obstructive pulmonary disease) (HCC)    Hypertension    Iliac artery stenosis, right (HCC)    80% Stenosis   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[place])    Lower limb ischemia 03/26/2018   Lung cancer    LUL adenocarcinoma 12/28/14, s/p SBRT; poorly differentiated NSCLC RUL 11/23/17, s/p SBRT    Pancreatitis    x2 stents placed to make patent duct to pancreas   Pulmonary hypertension (HCC)    PASP 48 mmHg 03/26/18 (echo, [facility])   Severe claudication ([place])    Status post left foot surgery    Tobacco abuse    Assessment: [name] is a 70 y.o. male with a past medical history significant for adenocarcinoma of the lung getting ready to start radiation therapy, previous bowel obstruction, previous pneumothorax, COPD but not on any home oxygen, hypertension who presented with chills, productive cough, and sob.  Pharmacy consulted to dose heparin drip for PE on 12/2.  Today, Pharmacy is consulted to transition patient from IV heparin to apixaban.  Goal of Therapy:  Heparin level 0.3-0.7 units/ml Monitor platelets by anticoagulation protocol: Yes  Today, 05/13/2021 0337 HL 0.35 therapeutic on 1000 units/hr and has been at goal since 12/4 Hgb 11.6, plts WNL Per RN, no issues or bleeding noted  Plan:  Discontinue IV heparin and associated labs At the time of heparin discontinuation begin Apixaban 10 mg twice daily for 6 days followed by 5 mg twice daily Monitor CBC, signs/symptoms of bleeding  Royetta Asal, PharmD, [facility] Please utilize Amion for appropriate phone number to reach the unit pharmacist ([place]) 05/13/2021 9:35 AM

## 2021-05-13 NOTE — Progress Notes (Signed)
Pt requested nebulizer treatments be as needed. Order changed

## 2021-05-13 NOTE — Progress Notes (Signed)
Initial Nutrition Assessment  DOCUMENTATION CODES:   Severe malnutrition in context of chronic illness, Underweight  INTERVENTION:   -Prosource Plus PO TID, each provides 100 kcals and 15g protein   -Multivitamin with minerals daily  -Milk and ice cream with every meal tray  -Double protein portions with meals  NUTRITION DIAGNOSIS:   Severe Malnutrition related to chronic illness, cancer and cancer related treatments as evidenced by energy intake < or equal to 75% for > or equal to 1 month, severe fat depletion, severe muscle depletion.  GOAL:   Patient will meet greater than or equal to 90% of their needs  MONITOR:   PO intake, Supplement acceptance, Labs, Weight trends, I & O's  REASON FOR ASSESSMENT:   Consult Assessment of nutrition requirement/status  ASSESSMENT:   70 year old male with extreme cachexia, failure to thrive and what obviously looks like severe protein calorie malnutrition, advanced COPD, hypertension, peripheral arterial disease, anxiety and lung cancer.  Admitted to the ED for confusion and hypoxemia.  Patient in room with significant other in bed, brushing his hair. Pt reports poor appetite. States he typically eats 2 meals a day with 2 Boost Plus supplements daily (~700 kcals, 28g protein), prefers vanilla. Does not like Ensure. Pt with no teeth but denies issues with chewing foods. Denies swallowing issues. SLP evaluated yesterday and approved for regular diet.  Pt tolerates dairy, so encouraged milk and ice cream on his trays. Will also order for double protein item portions.  Willing to try Prosource supplements, will order.  Per weight records, no weight changes noted recently.  Medications: Lasix, Lactulose, KLOR-CON, K-Phos  Labs reviewed:   Low Na, K, Phos  NUTRITION - FOCUSED PHYSICAL EXAM:  Flowsheet Row Most Recent Value  Orbital Region Mild depletion  Upper Arm Region Severe depletion  Thoracic and Lumbar Region Unable to assess   Buccal Region Severe depletion  Temple Region Severe depletion  Clavicle Bone Region Severe depletion  Clavicle and Acromion Bone Region Severe depletion  Scapular Bone Region Severe depletion  Dorsal Hand Moderate depletion  Patellar Region Severe depletion  Anterior Thigh Region Severe depletion  Posterior Calf Region Severe depletion  Edema (RD Assessment) None  Hair Reviewed  [being brushed by SO]  Eyes Reviewed  Mouth Reviewed  [no teeth]  Skin Reviewed       Diet Order:   Diet Order             Diet regular Room service appropriate? Yes; Fluid consistency: Thin  Diet effective now                   EDUCATION NEEDS:   Education needs have been addressed  Skin:  Skin Assessment: Reviewed RN Assessment  Last BM:  12/5 -type 6-7  Height:   Ht Readings from Last 1 Encounters:  05/10/21 5\' 2"  (1.575 m)    Weight:   Wt Readings from Last 1 Encounters:  05/10/21 38.5 kg    BMI:  Body mass index is 15.52 kg/m.  Estimated Nutritional Needs:   Kcal:  1700-1900  Protein:  85-100g  Fluid:  1.9L/day  Scott Bibles, MS, RD, LDN Inpatient Clinical Dietitian Contact information available via Amion

## 2021-05-13 NOTE — Progress Notes (Signed)
NAME:  [name], MRN:  [id number], DOB:  1950/08/29, LOS: 3 ADMISSION DATE:  05/10/2021, CONSULTATION DATE:  12/5 REFERRING MD:  Zigmund Daniel, CHIEF COMPLAINT:  dyspnea   History of Present Illness:  70 y/o male with advanced COPD and pulmonary cachexia presented with acute hypoxemic and hypercarbic respiratory failure due to a COPD exacerbation.    Pertinent  Medical History  COPD Hypertension Pancreatitis Lung cancer > stage IA, NSCLC LUL and RLL diagonsed 05/01/2021, planning XRT Pulmonary hypertension Tobacco abuse, 50 pack year smoking history  Significant Hospital Events: Including procedures, antibiotic start and stop dates in addition to other pertinent events   05/10/2021: Admission 05/11/2021: On BiPAP and CCM consultation 12/5 feeling better  Interim History / Subjective:  Feels better Wants to walk around  Coughing up thick mucus  Wants to get oxygen at home  Objective   Blood pressure (!) 158/101, pulse (!) 107, temperature (!) 97.4 F (36.3 C), temperature source Oral, resp. rate 20, height 5\' 2"  (1.575 m), weight 38.5 kg, SpO2 95 %.        Intake/Output Summary (Last 24 hours) at 05/13/2021 1604 Last data filed at 05/13/2021 1500 Gross per 24 hour  Intake 115.74 ml  Output 825 ml  Net -709.26 ml   Filed Weights   05/10/21 1956  Weight: 38.5 kg    Examination:  General:  Cachectic, now resting comfortably in bed HENT: NCAT OP clear PULM: Poor air movement B, normal effort CV: RRR, no mgr GI: BS+, soft, nontender MSK: extremely low muscle tone Neuro: awake, alert, no distress, MAEW   Resolved Hospital Problem list     Assessment & Plan:  Severe COPD with acute exacerbation Stage Ia lung cancer Chronic respiratory failure with hypercarbia due to COPD Acute respiratory failure with hypoxemia> I suspect this is chronic Severe protein calorie malnutrition Severe sarcopenia/pulmonary cachexia  Discussion: [name] has improved but he has very  severe COPD complicated by his cachexia and malnutrition (not to mention lung cancer) so his overall prognosis is not good.  We could help keep him out of the hospital with a home NIMV machine.  He will need home O2.  Patient's [name] with [birth year]  has chronic hypercapnic respiratory failure due to severe COPD is life threatening.  Previous ABG's have documented high PCO2 and spirometry (2016) reveals severe obstructive ventilatory defect.  Patient would benefit from non-invasive ventilation.  Without this therapy, the patient is at high risk of ending up with worsening symptoms, worsened respiratory failure, need for ER visits and/or recurrent hospitalizations.  Bilevel device unable to adequately support patient's nocturnal ventilation needs.  Patient would benefit from NIV therapy with set tidal volumes and pressure.    Plan: Complete prednisone as ordered Add Anoro for in the hospital, but can go home and take Breztri again Will ask TOC to start making arrangements for NIMV at home Home O2 assessment Will need outpatient pulmonary follow up Will follow Continue augmentin  I updated his wife by phone   Best Practice (right click and "Reselect all SmartList Selections" daily)   Per [facility]  Labs   CBC: Recent Labs  Lab 05/10/21 2030 05/11/21 0745 05/12/21 0023 05/13/21 0337  WBC 5.9 5.9 13.6* 17.2*  NEUTROABS 4.4  --   --   --   HGB 11.1* 11.3* 11.0* 11.6*  HCT 33.2* 34.9* 32.1* 34.0*  MCV 94.6 99.1 92.8 92.4  PLT 279 260 295 557    Basic Metabolic Panel: Recent Labs  Lab 05/10/21  2030 05/11/21 0745 05/12/21 0023 05/13/21 0337  NA 125* 125* 130* 133*  K 4.5 4.1 3.3* 3.1*  CL 87* 90* 91* 93*  CO2 28 23 28  33*  GLUCOSE 130* 163* 172* 157*  BUN 39* 40* 43* 52*  CREATININE 1.09 1.17 0.95 1.01  CALCIUM 9.1 8.8* 8.5* 9.0  MG  --   --  2.5* 2.2  PHOS  --   --  4.4 1.7*   GFR: Estimated Creatinine Clearance: 37.6 mL/min (by C-G formula based on SCr of 1.01  mg/dL). Recent Labs  Lab 05/10/21 2030 05/11/21 0000 05/11/21 0745 05/12/21 0023 05/13/21 0337  WBC 5.9  --  5.9 13.6* 17.2*  LATICACIDVEN 0.9 0.7  --   --   --     Liver Function Tests: Recent Labs  Lab 05/10/21 2030 05/11/21 0745  AST 51* 45*  ALT 38 36  ALKPHOS 101 108  BILITOT 0.5 0.5  PROT 7.8 7.2  ALBUMIN 3.5 3.2*   Recent Labs  Lab 05/10/21 2030  LIPASE 25   Recent Labs  Lab 05/11/21 0745  AMMONIA 76*    ABG    Component Value Date/Time   PHART 7.398 05/13/2021 0500   PCO2ART 54.3 (H) 05/13/2021 0500   PO2ART 71.8 (L) 05/13/2021 0500   HCO3 32.8 (H) 05/13/2021 0500   ACIDBASEDEF 2.6 (H) 05/11/2021 0745   O2SAT 93.4 05/13/2021 0500     Coagulation Profile: Recent Labs  Lab 05/11/21 0000  INR 1.1    Cardiac Enzymes: No results for input(s): CKTOTAL, CKMB, CKMBINDEX, TROPONINI in the last 168 hours.  HbA1C: Hgb A1c MFr Bld  Date/Time Value Ref Range Status  07/07/2018 05:25 AM 5.8 (H) 4.8 - 5.6 % Final    Comment:    (NOTE) Pre diabetes:          5.7%-6.4% Diabetes:              >6.4% Glycemic control for   <7.0% adults with diabetes     CBG: No results for input(s): GLUCAP in the last 168 hours.  Critical care time: n/a > 35 minutes spent on this visit in consultation with the patient and calling his wife     Roselie Awkward, MD [facility] Pager: [phone] Cell: [phone] After 7:00 pm call Elink  [phone]

## 2021-05-14 ENCOUNTER — Ambulatory Visit: Payer: Medicare Other

## 2021-05-14 DIAGNOSIS — J9601 Acute respiratory failure with hypoxia: Secondary | ICD-10-CM | POA: Diagnosis not present

## 2021-05-14 DIAGNOSIS — J9602 Acute respiratory failure with hypercapnia: Secondary | ICD-10-CM | POA: Diagnosis not present

## 2021-05-14 LAB — CBC
HCT: 37.3 % — ABNORMAL LOW (ref 39.0–52.0)
Hemoglobin: 12.8 g/dL — ABNORMAL LOW (ref 13.0–17.0)
MCH: 31.9 pg (ref 26.0–34.0)
MCHC: 34.3 g/dL (ref 30.0–36.0)
MCV: 93 fL (ref 80.0–100.0)
Platelets: 374 10*3/uL (ref 150–400)
RBC: 4.01 MIL/uL — ABNORMAL LOW (ref 4.22–5.81)
RDW: 14.3 % (ref 11.5–15.5)
WBC: 14.5 10*3/uL — ABNORMAL HIGH (ref 4.0–10.5)
nRBC: 0.1 % (ref 0.0–0.2)

## 2021-05-14 LAB — AMMONIA: Ammonia: 13 umol/L (ref 9–35)

## 2021-05-14 MED ORDER — FUROSEMIDE 10 MG/ML IJ SOLN: 20.0000 mg | Freq: Every day | INTRAMUSCULAR | Status: DC

## 2021-05-14 MED ORDER — POTASSIUM CHLORIDE CRYS ER 10 MEQ PO TBCR
10.0000 meq | EXTENDED_RELEASE_TABLET | Freq: Every day | ORAL | Status: DC
  Administered 2021-05-15 – 2021-05-16 (×2): 10 meq via ORAL
  Filled 2021-05-14 (×2): qty 1

## 2021-05-14 NOTE — Evaluation (Signed)
Occupational Therapy Evaluation Patient Details Name: [name] MRN: [id number] DOB: 1950/10/31 Today's Date: 05/14/2021   History of Present Illness 70 year old male with history of lung CA non-small cell lung cancer in the left upper lobe and right lower lobe diagnosed 05/01/2021, end-stage COPD hypertension PAD anxiety admitted with hypoxia and confusion.  Patient lives at home with his significant other who called EMS as patient was more confused with cough decreased appetite and fatigue.  Patient was saturating in the 60s on room air with EMS.  Work-up in the ED revealed CT angiogram of the chest with single chronic appearing pulmonary embolism and right lower lobe bilateral changes consistent with known lung carcinoma.   Clinical Impression   Pt is typically independent in ADL in home environment, continued cognitive deficits but pleasant and cooperative throughout evaluation. Sitting EOB upon OT entering and needing min A for toilet transfer, Pt incontinent of stool and unaware. Overall mod to max A for LB cleaning and re-dressing. Pt in chair at the end of the session and will benefit from skilled OT in the acute setting as well as afterwards in the [facility] setting to maximize safety and independence in ADL and functional transfers.       Recommendations for follow up therapy are one component of a multi-disciplinary discharge planning process, led by the attending physician.  Recommendations may be updated based on patient status, additional functional criteria and insurance authorization.   Follow Up Recommendations  Home health OT    Assistance Recommended at Discharge Intermittent Supervision/Assistance  Functional Status Assessment  Patient has had a recent decline in their functional status and demonstrates the ability to make significant improvements in function in a reasonable and predictable amount of time.  Equipment Recommendations  None recommended by OT (Pt has  appropriate DME)    Recommendations for Other Services PT consult     Precautions / Restrictions Precautions Precautions: Fall Restrictions Weight Bearing Restrictions: No      Mobility Bed Mobility               General bed mobility comments: EOB when OT entered the room    Transfers Overall transfer level: Needs assistance Equipment used: 1 person hand held assist Transfers: Sit to/from Stand;Bed to chair/wheelchair/BSC Sit to Stand: Min assist     Step pivot transfers: Min assist     General transfer comment: assist to rise and steady and take pivotal steps to chair      Balance Overall balance assessment: Needs assistance Sitting-balance support: Feet supported;No upper extremity supported Sitting balance-Leahy Scale: Good     Standing balance support: Single extremity supported Standing balance-Leahy Scale: Fair                             ADL either performed or assessed with clinical judgement   ADL Overall ADL's : Needs assistance/impaired     Grooming: Wash/dry hands;Wash/dry face;Set up;Sitting Grooming Details (indicate cue type and reason): in recliner Upper Body Bathing: Moderate assistance   Lower Body Bathing: Moderate assistance   Upper Body Dressing : Set up   Lower Body Dressing: Moderate assistance;Sitting/lateral leans   Toilet Transfer: Minimal assistance (HHA)   Toileting- Clothing Manipulation and Hygiene: Maximal assistance;Sit to/from stand Toileting - Clothing Manipulation Details (indicate cue type and reason): Pt able to maintain standing, and OT providing peri care Tub/ Shower Transfer: Minimal assistance   Functional mobility during ADLs: Minimal assistance (HHA) General ADL Comments:  decreased activity tolerance, balance,     Vision Patient Visual Report: No change from baseline Vision Assessment?: No apparent visual deficits     Perception     Praxis      Pertinent Vitals/Pain Pain Assessment:  No/denies pain Breathing: normal Negative Vocalization: none Facial Expression: smiling or inexpressive Body Language: relaxed Consolability: no need to console PAINAD Score: 0     Hand Dominance     Extremity/Trunk Assessment Upper Extremity Assessment Upper Extremity Assessment: Generalized weakness   Lower Extremity Assessment Lower Extremity Assessment: Generalized weakness       Communication Communication Communication: No difficulties   Cognition Arousal/Alertness: Awake/alert Behavior During Therapy: WFL for tasks assessed/performed Overall Cognitive Status: Impaired/Different from baseline Area of Impairment: Memory;Safety/judgement;Awareness;Problem solving                     Memory: Decreased short-term memory   Safety/Judgement: Decreased awareness of deficits Awareness: Emergent Problem Solving: Decreased initiation;Requires verbal cues General Comments: Pt unaware of having BM, required mulimodal cues for all aspects of ADL today. Pt very pleasant and cooperative     General Comments  no family present at time of eval    Exercises     Shoulder Instructions      Home Living Family/patient expects to be discharged to:: Private residence Living Arrangements: Spouse/significant other Available Help at Discharge: Family Type of Home: House Home Access: Stairs to enter Technical brewer of Steps: 2   Home Layout: One level     Bathroom Shower/Tub: Occupational psychologist: Handicapped height     Home Equipment: [place] (4 wheels);Shower seat;Wheelchair - manual;Grab bars - toilet;Grab bars - tub/shower   Additional Comments: significant other is HHRN and works Thursday through Sunday      Prior Functioning/Environment Prior Level of Function : Independent/Modified Independent                        OT Problem List: Decreased strength;Decreased activity tolerance;Impaired balance (sitting and/or  standing);Decreased knowledge of use of DME or AE      OT Treatment/Interventions: Self-care/ADL training;DME and/or AE instruction;Energy conservation;Therapeutic activities;Patient/family education;Balance training    OT Goals(Current goals can be found in the care plan section) Acute Rehab OT Goals Patient Stated Goal: get back to myself OT Goal Formulation: With patient Time For Goal Achievement: 05/28/21 Potential to Achieve Goals: Good ADL Goals Pt Will Perform Grooming: with modified independence;standing Pt Will Perform Upper Body Dressing: with modified independence;sitting Pt Will Perform Lower Body Dressing: with modified independence;sit to/from stand Pt Will Transfer to Toilet: with supervision;ambulating Pt Will Perform Toileting - Clothing Manipulation and hygiene: with modified independence;sit to/from stand Additional ADL Goal #1: Pt will verbalize 3 energy conservation strategies to use during ADL with no cues  OT Frequency: Min 2X/week   Barriers to D/C:            Co-evaluation              AM-PAC OT "6 Clicks" Daily Activity     Outcome Measure Help from another person eating meals?: None Help from another person taking care of personal grooming?: A Little Help from another person toileting, which includes using toliet, bedpan, or urinal?: A Lot Help from another person bathing (including washing, rinsing, drying)?: A Lot Help from another person to put on and taking off regular upper body clothing?: A Little Help from another person to put on and taking off regular lower  body clothing?: A Lot 6 Click Score: 16   End of Session Nurse Communication: Mobility status  Activity Tolerance: Patient tolerated treatment well Patient left: in chair;with call bell/phone within reach;with chair alarm set  OT Visit Diagnosis: Unsteadiness on feet (R26.81);Muscle weakness (generalized) (M62.81)                Time: 4665-9935 OT Time Calculation (min): 29  min Charges:  OT General Charges $OT Visit: 1 Visit OT Evaluation $OT Eval Low Complexity: 1 Low OT Treatments $Self Care/Home Management : 8-22 mins  Jesse Sans OTR/L Acute Rehabilitation Services Pager: [phone] Office: [place] 05/14/2021, 1:48 PM

## 2021-05-14 NOTE — Progress Notes (Signed)
NAME:  [name], MRN:  [id number], DOB:  [birth year], LOS: 4 ADMISSION DATE:  05/10/2021, CONSULTATION DATE:  12/5 REFERRING MD:  Zigmund Daniel, CHIEF COMPLAINT:  dyspnea   History of Present Illness:  70 y/o male with advanced COPD and pulmonary cachexia presented with acute hypoxemic and hypercarbic respiratory failure due to a COPD exacerbation.    Pertinent  Medical History  COPD Hypertension Pancreatitis Lung cancer > stage IA, NSCLC LUL and RLL diagonsed 05/01/2021, planning XRT Pulmonary hypertension Tobacco abuse, 50 pack year smoking history  Significant Hospital Events: Including procedures, antibiotic start and stop dates in addition to other pertinent events   05/10/2021: Admission 05/11/2021: On BiPAP and CCM consultation 12/5 feeling better  Interim History / Subjective:  Feels better Wants to walk around  Coughing up thick mucus  Wants to get oxygen at home  Objective   Blood pressure (!) 148/106, pulse 94, temperature (!) 97.4 F (36.3 C), temperature source Oral, resp. rate 20, height 5\' 2"  (1.575 m), weight 38.5 kg, SpO2 92 %.        Intake/Output Summary (Last 24 hours) at 05/14/2021 1145 Last data filed at 05/14/2021 0736 Gross per 24 hour  Intake 21.25 ml  Output 1800 ml  Net -1778.75 ml    Filed Weights   05/10/21 1956  Weight: 38.5 kg    General:  thin, poorly nourished M, resting in bed in no acute distress HEENT: MM pink/moist Neuro: awake and alert, oriented and conversational CV: s1s2 rrr, no m/r/g PULM:  no distress on 5L [place], expiratory wheezing bilaterally without accessory muscle use GI: soft, bsx4 active  Extremities: warm/dry, poor muscle tone, no edema  Skin: no rashes or lesions    Resolved Hospital Problem list     Assessment & Plan:  Severe COPD with acute exacerbation Stage Ia lung cancer Chronic respiratory failure with hypercarbia due to COPD Acute respiratory failure with hypoxemia> I suspect this is chronic Severe  protein calorie malnutrition Severe sarcopenia/pulmonary cachexia  Discussion: Very severe COPD complicated by his cachexia and malnutrition (not to mention lung cancer) so his overall prognosis is not good.  We could help keep him out of the hospital with a home NIMV machine.  He will need home O2.  Patient's [name] with 1950-10-27  has chronic hypercapnic respiratory failure due to severe COPD is life threatening.  Previous ABG's have documented high PCO2 and spirometry (2016) reveals severe obstructive ventilatory defect.  Patient would benefit from non-invasive ventilation.  Without this therapy, the patient is at high risk of ending up with worsening symptoms, worsened respiratory failure, need for ER visits and/or recurrent hospitalizations.  Bilevel device unable to adequately support patient's nocturnal ventilation needs.  Patient would benefit from NIV therapy with set tidal volumes and pressure.    Plan: -continue plan as above, order placed for ambulation to assess home O2 needs -[facility] follow up appt scheduled 12/20 at 3:00pm  -complete course of Prednisone -Anora while inpatient and continue Breztri -continue Augmentin   Wife updated at the bedside   Best Practice (right click and "Reselect all SmartList Selections" daily)   Per [facility]  Labs   CBC: Recent Labs  Lab 05/10/21 2030 05/11/21 0745 05/12/21 0023 05/13/21 0337 05/14/21 0453  WBC 5.9 5.9 13.6* 17.2* 14.5*  NEUTROABS 4.4  --   --   --   --   HGB 11.1* 11.3* 11.0* 11.6* 12.8*  HCT 33.2* 34.9* 32.1* 34.0* 37.3*  MCV 94.6 99.1 92.8 92.4 93.0  PLT 279 260 295 362 374     Basic Metabolic Panel: Recent Labs  Lab 05/10/21 2030 05/11/21 0745 05/12/21 0023 05/13/21 0337  NA 125* 125* 130* 133*  K 4.5 4.1 3.3* 3.1*  CL 87* 90* 91* 93*  CO2 28 23 28  33*  GLUCOSE 130* 163* 172* 157*  BUN 39* 40* 43* 52*  CREATININE 1.09 1.17 0.95 1.01  CALCIUM 9.1 8.8* 8.5* 9.0  MG  --   --  2.5* 2.2   PHOS  --   --  4.4 1.7*    GFR: Estimated Creatinine Clearance: 37.6 mL/min (by C-G formula based on SCr of 1.01 mg/dL). Recent Labs  Lab 05/10/21 2030 05/11/21 0000 05/11/21 0745 05/12/21 0023 05/13/21 0337 05/14/21 0453  WBC 5.9  --  5.9 13.6* 17.2* 14.5*  LATICACIDVEN 0.9 0.7  --   --   --   --      Liver Function Tests: Recent Labs  Lab 05/10/21 2030 05/11/21 0745  AST 51* 45*  ALT 38 36  ALKPHOS 101 108  BILITOT 0.5 0.5  PROT 7.8 7.2  ALBUMIN 3.5 3.2*    Recent Labs  Lab 05/10/21 2030  LIPASE 25    Recent Labs  Lab 05/11/21 0745 05/14/21 0453  AMMONIA 76* 13     ABG    Component Value Date/Time   PHART 7.398 05/13/2021 0500   PCO2ART 54.3 (H) 05/13/2021 0500   PO2ART 71.8 (L) 05/13/2021 0500   HCO3 32.8 (H) 05/13/2021 0500   ACIDBASEDEF 2.6 (H) 05/11/2021 0745   O2SAT 93.4 05/13/2021 0500      Coagulation Profile: Recent Labs  Lab 05/11/21 0000  INR 1.1     Cardiac Enzymes: No results for input(s): CKTOTAL, CKMB, CKMBINDEX, TROPONINI in the last 168 hours.  HbA1C: Hgb A1c MFr Bld  Date/Time Value Ref Range Status  07/07/2018 05:25 AM 5.8 (H) 4.8 - 5.6 % Final    Comment:    (NOTE) Pre diabetes:          5.7%-6.4% Diabetes:              >6.4% Glycemic control for   <7.0% adults with diabetes     CBG: No results for input(s): GLUCAP in the last 168 hours.  Critical care time: n/a     Scott Carpen Emine Lopata, PA-C [facility] See Amion for pager If no response to pager , please call [phone] until 7pm After 7:00 pm call Elink  [phone]

## 2021-05-14 NOTE — TOC Progression Note (Signed)
Transition of Care [facility]) - Progression Note    Patient Details  Name: [name] MRN: [id number] Date of Birth: Feb 06, 1951  [facility] CM/SW Contact  Elbert Spickler, Juliann Pulse, RN Phone Number: 05/14/2021, 3:17 PM  Clinical Narrative: [facility] for HHPT-rep Ramond Marrow aware;referral for non invasive vent-[employer] rep Zach following.      Expected Discharge Plan: [place] Barriers to Discharge: Continued Medical Work up  Expected Discharge Plan and Services Expected Discharge Plan: [place]                                               Social Determinants of Health (SDOH) Interventions    Readmission Risk Interventions No flowsheet data found.

## 2021-05-14 NOTE — Progress Notes (Addendum)
PROGRESS NOTE    [name]  IZT:[id number] DOB: [birth year] DOA: 05/10/2021 PCP: Janie Morning, DO   Brief Narrative: 70 year old male with history of lung CA non-small cell lung cancer in the left upper lobe and right lower lobe diagnosed 05/01/2021, end-stage COPD hypertension PAD anxiety admitted with hypoxia and confusion.  Patient lives at home with his significant other who called EMS as patient was more confused with cough decreased appetite and fatigue.  Patient was saturating in the 60s on room air with EMS.  Work-up in the ED revealed CT angiogram of the chest with single chronic appearing pulmonary embolism and right lower lobe bilateral changes consistent with known lung carcinoma.  His sodium was 125.  BNP was elevated at 1941.  Lactic acid was normal troponin was normal.  Patient was started on IV heparin and Lasix in the ED. ABG 7.2 3/62/169 from 7.1 4/87/277  Assessment & Plan:   Principal Problem:   Acute respiratory failure with hypoxia and hypercarbia (HCC) Active Problems:   Hyponatremia   COPD with acute exacerbation (HCC)   Essential hypertension   PAD (peripheral artery disease) (HCC)   Acute metabolic encephalopathy   Pulmonary embolism (HCC)   Lung cancer (HCC)   #1 acute on chronic hypoxic/hypercapnic respiratory failure secondary to acute COPD exacerbation in the setting of lung cancer and severe COPD, diastolic CHF with elevated BNP and chronic PE.  Continue to use BiPAP nightly and as needed. Continue Lasix, decrease Lasix to 20 mg daily. Taper steroids as tolerated. Continue Eliquis Chest x-ray 05/12/2021 with new left upper lobe infiltrates.(New)  Started Augmentin 05/13/2021. Patient appears more awake and alert.  Still not back to his baseline per his wife.  Echocardiogram NL EF GRADE 3 DD    #2 severe end-stage COPD-patient presents with acute on chronic hypoxic/hypercapnic respiratory failure due to severe COPD.   The use of NIV will treat  patient's high PCO2 levels(54.2 with elevated bicarbonate of 32.8 while using BiPAP during current hospital stay) and can reduce the risk of exacerbations and future hospitalizations when used at night and during the day.  He is readmitted less than 1 month following his last hospitalization for elective surgery.  BiPAP has been proven ineffective to provide essential volume control necessary to maintain acceptable CO2 levels as ABGs demonstrate consistent hypercapnia despite best efforts with using BiPAP.  An NIV with IVAPS is necessary to prevent patient from life-threatening harm. Interruption or failure to provide NIV would quickly lead to exacerbation of   Patient's condition, hospital re admissions and likely harm to the patient.  Furthermore [employer] [facility] breathe a little easier program has shown an 85.1 readmission reduction locally for COPD patients.  Continued use is preferred.  Patient is able to protect their airway and clear secretions on their own. He will need oxygen at home on discharge. Check ambulatory oxygen saturation  #3 pulmonary embolism appears chronic in the setting of malignancy on Eliquis.  #4 acute metabolic encephalopathy improving.  Multifactorial secondary to hypoxia hypercapnia elevated ammonia   #5 history of lung CA has outpatient follow-up with Dr. Tammi Klippel for radiation to new lesions.  Follows up with Dr. Valeta Harms.  #6  Hyper volemic hyponatremia sodium improved with diuresis.    #7 polysubstance abuse check urine drug screen positive for THC  #8 elevated ammonia-patient received lactulose and is having multiple loose stools and lactulose has been stopped.    #9 history of essential hypertension was on Norvasc and Toprol at home.  Restart  Toprol.  Continue Lasix.  #10 goals of care-discussed with daughter healthcare POA patient is DNR DNI.  #57 diastolic heart failure grade 3 diastolic dysfunction with preserved ejection fraction-I have decreased his Lasix down to  20 mg daily.  He is negative close to 1.7 L today.  Estimated body mass index is 15.52 kg/m as calculated from the following:   Height as of this encounter: 5\' 2"  (1.575 m).   Weight as of this encounter: 38.5 kg.  DVT prophylaxis:heparin Code Status:full Family Communication: Discussed with significant other in his room  disposition Plan:  Status is: Inpatient  Remains inpatient appropriate because: bipap resp failure   Consultants:  pccm  Procedures:none Antimicrobials: none Subjective:  Has multiple loose stools today going for radiation today On lactulose . Objective: Vitals:   05/13/21 1624 05/13/21 2122 05/14/21 0913 05/14/21 1241  BP: (!) 157/94 (!) 148/106  140/84  Pulse: 97 94  (!) 107  Resp: (!) 24 20  18   Temp: 97.7 F (36.5 C) (!) 97.4 F (36.3 C)    TempSrc: Oral Oral    SpO2: 94% 97% 92% (!) 80%  Weight:      Height:        Intake/Output Summary (Last 24 hours) at 05/14/2021 1356 Last data filed at 05/14/2021 0736 Gross per 24 hour  Intake 21.25 ml  Output 1800 ml  Net -1778.75 ml    Filed Weights   05/10/21 1956  Weight: 38.5 kg    Examination:  General exam: Appears much more awake answering some questions appropriately Appears frail chronically ill Respiratory system: Decreased breath sounds on to auscultation. Respiratory effort normal. Cardiovascular system: S1 & S2 heard, RRR. No JVD, murmurs, rubs, gallops or clicks. No pedal edema. Gastrointestinal system: Abdomen is nondistended, soft and nontender. No organomegaly or masses felt. Normal bowel sounds heard. Central nervous system: Awake Extremities: No edema Skin: No rashes, lesions or ulcers Psychiatry:unable to assess   Data Reviewed: I have personally reviewed following labs and imaging studies  CBC: Recent Labs  Lab 05/10/21 2030 05/11/21 0745 05/12/21 0023 05/13/21 0337 05/14/21 0453  WBC 5.9 5.9 13.6* 17.2* 14.5*  NEUTROABS 4.4  --   --   --   --   HGB 11.1* 11.3*  11.0* 11.6* 12.8*  HCT 33.2* 34.9* 32.1* 34.0* 37.3*  MCV 94.6 99.1 92.8 92.4 93.0  PLT 279 260 295 362 846    Basic Metabolic Panel: Recent Labs  Lab 05/10/21 2030 05/11/21 0745 05/12/21 0023 05/13/21 0337  NA 125* 125* 130* 133*  K 4.5 4.1 3.3* 3.1*  CL 87* 90* 91* 93*  CO2 28 23 28  33*  GLUCOSE 130* 163* 172* 157*  BUN 39* 40* 43* 52*  CREATININE 1.09 1.17 0.95 1.01  CALCIUM 9.1 8.8* 8.5* 9.0  MG  --   --  2.5* 2.2  PHOS  --   --  4.4 1.7*    GFR: Estimated Creatinine Clearance: 37.6 mL/min (by C-G formula based on SCr of 1.01 mg/dL). Liver Function Tests: Recent Labs  Lab 05/10/21 2030 05/11/21 0745  AST 51* 45*  ALT 38 36  ALKPHOS 101 108  BILITOT 0.5 0.5  PROT 7.8 7.2  ALBUMIN 3.5 3.2*    Recent Labs  Lab 05/10/21 2030  LIPASE 25    Recent Labs  Lab 05/11/21 0745 05/14/21 0453  AMMONIA 76* 13    Coagulation Profile: Recent Labs  Lab 05/11/21 0000  INR 1.1    Cardiac Enzymes: No results for  input(s): CKTOTAL, CKMB, CKMBINDEX, TROPONINI in the last 168 hours. BNP (last 3 results) No results for input(s): PROBNP in the last 8760 hours. HbA1C: No results for input(s): HGBA1C in the last 72 hours. CBG: No results for input(s): GLUCAP in the last 168 hours. Lipid Profile: No results for input(s): CHOL, HDL, LDLCALC, TRIG, CHOLHDL, LDLDIRECT in the last 72 hours. Thyroid Function Tests: No results for input(s): TSH, T4TOTAL, FREET4, T3FREE, THYROIDAB in the last 72 hours.  Anemia Panel: No results for input(s): VITAMINB12, FOLATE, FERRITIN, TIBC, IRON, RETICCTPCT in the last 72 hours. Sepsis Labs: Recent Labs  Lab 05/10/21 2030 05/11/21 0000  LATICACIDVEN 0.9 0.7     Recent Results (from the past 240 hour(s))  Blood culture (routine x 2)     Status: None (Preliminary result)   Collection Time: 05/10/21  8:30 PM   Specimen: BLOOD  Result Value Ref Range Status   Specimen Description   Final    BLOOD RIGHT ANTECUBITAL Performed  at [facility] [address], [place], [place] [address]    Special Requests   Final    BOTTLES DRAWN AEROBIC AND ANAEROBIC Blood Culture results may not be optimal due to an excessive volume of blood received in culture bottles Performed at [facility] [address], [place], [place] [address]    Culture   Final    NO GROWTH 3 DAYS Performed at [facility], [place] [address], [place], [place] [address]    Report Status PENDING  Incomplete  Blood culture (routine x 2)     Status: None (Preliminary result)   Collection Time: 05/10/21  8:30 PM   Specimen: BLOOD  Result Value Ref Range Status   Specimen Description   Final    BLOOD LEFT ANTECUBITAL Performed at [facility] [address], [place], [place] [address]    Special Requests   Final    BOTTLES DRAWN AEROBIC AND ANAEROBIC Blood Culture results may not be optimal due to an excessive volume of blood received in culture bottles Performed at [facility] [address], [place], [place] [address]    Culture   Final    NO GROWTH 3 DAYS Performed at [facility], [place] [address], [place], [place] [address]    Report Status PENDING  Incomplete  Resp Panel by RT-PCR (Flu A&B, Covid)     Status: None   Collection Time: 05/10/21  8:30 PM  Result Value Ref Range Status   SARS Coronavirus 2 by RT PCR NEGATIVE NEGATIVE Final    Comment: (NOTE) SARS-CoV-2 target nucleic acids are NOT DETECTED.  The SARS-CoV-2 RNA is generally detectable in upper respiratory specimens during the acute phase of infection. The lowest concentration of SARS-CoV-2 viral copies this assay can detect is 138 copies/mL. A negative result does not preclude SARS-Cov-2 infection and should not be used as the sole basis for treatment or other patient management decisions. A negative result may occur with  improper specimen collection/handling, submission of specimen  other than nasopharyngeal swab, presence of viral mutation(s) within the areas targeted by this assay, and inadequate number of viral copies(<138 copies/mL). A negative result must be combined with clinical observations, patient history, and epidemiological information. The expected result is Negative.  Fact Sheet for Patients:  EntrepreneurPulse.com.au  Fact Sheet for Healthcare Providers:  IncredibleEmployment.be  This test is no t yet approved or cleared by the [place] and  has been authorized  for detection and/or diagnosis of SARS-CoV-2 by FDA under an Emergency Use Authorization (EUA). This EUA will remain  in effect (meaning this test can be used) for the duration of the COVID-19 declaration under Section 564(b)(1) of the Act, 21 U.S.C.section 360bbb-3(b)(1), unless the authorization is terminated  or revoked sooner.       Influenza A by PCR NEGATIVE NEGATIVE Final   Influenza B by PCR NEGATIVE NEGATIVE Final    Comment: (NOTE) The Xpert Xpress SARS-CoV-2/FLU/RSV plus assay is intended as an aid in the diagnosis of influenza from Nasopharyngeal swab specimens and should not be used as a sole basis for treatment. Nasal washings and aspirates are unacceptable for Xpert Xpress SARS-CoV-2/FLU/RSV testing.  Fact Sheet for Patients: EntrepreneurPulse.com.au  Fact Sheet for Healthcare Providers: IncredibleEmployment.be  This test is not yet approved or cleared by the [place] FDA and has been authorized for detection and/or diagnosis of SARS-CoV-2 by FDA under an Emergency Use Authorization (EUA). This EUA will remain in effect (meaning this test can be used) for the duration of the COVID-19 declaration under Section 564(b)(1) of the Act, 21 U.S.C. section 360bbb-3(b)(1), unless the authorization is terminated or revoked.  Performed at [facility], [place] [address], [place], [place] [address]   MRSA Next Gen by PCR, Nasal     Status: None   Collection Time: 05/11/21  1:53 AM   Specimen: Nasal Mucosa; Nasal Swab  Result Value Ref Range Status   MRSA by PCR Next Gen NOT DETECTED NOT DETECTED Final    Comment: (NOTE) The GeneXpert MRSA Assay (FDA approved for NASAL specimens only), is one component of a comprehensive MRSA colonization surveillance program. It is not intended to diagnose MRSA infection nor to guide or monitor treatment for MRSA infections. Test performance is not FDA approved in patients less than 45 years old. Performed at [facility], [place] [address], [place], [place] [address]   Respiratory (~20 pathogens) panel by PCR     Status: None   Collection Time: 05/11/21 10:49 AM   Specimen: Nasopharyngeal Swab; Respiratory  Result Value Ref Range Status   Adenovirus NOT DETECTED NOT DETECTED Final   Coronavirus 229E NOT DETECTED NOT DETECTED Final    Comment: (NOTE) The Coronavirus on the Respiratory Panel, DOES NOT test for the novel  Coronavirus (2019 nCoV)    Coronavirus HKU1 NOT DETECTED NOT DETECTED Final   Coronavirus NL63 NOT DETECTED NOT DETECTED Final   Coronavirus OC43 NOT DETECTED NOT DETECTED Final   Metapneumovirus NOT DETECTED NOT DETECTED Final   Rhinovirus / Enterovirus NOT DETECTED NOT DETECTED Final   Influenza A NOT DETECTED NOT DETECTED Final   Influenza B NOT DETECTED NOT DETECTED Final   Parainfluenza Virus 1 NOT DETECTED NOT DETECTED Final   Parainfluenza Virus 2 NOT DETECTED NOT DETECTED Final   Parainfluenza Virus 3 NOT DETECTED NOT DETECTED Final   Parainfluenza Virus 4 NOT DETECTED NOT DETECTED Final   Respiratory Syncytial Virus NOT DETECTED NOT DETECTED Final   Bordetella pertussis NOT DETECTED NOT DETECTED Final   Bordetella Parapertussis NOT DETECTED NOT DETECTED Final   Chlamydophila pneumoniae NOT DETECTED NOT DETECTED Final   Mycoplasma pneumoniae NOT DETECTED NOT DETECTED  Final    Comment: Performed at [facility], [place]. [address], [place], [place] [address]          Radiology Studies: DG Chest 1 View  Result Date: 05/12/2021 CLINICAL DATA:  Hypoxia EXAM: CHEST  1 VIEW COMPARISON:  05/10/2021 FINDINGS: Cardiac shadow is within normal limits. Lungs are well aerated bilaterally. Increasing airspace opacity in the left upper lobe is noted consistent with acute on chronic infiltrate. No sizable effusion is seen. IMPRESSION: Acute on chronic infiltrate in the left upper lobe. Some degree of volume loss is noted. Electronically Signed   By: Inez Catalina M.D.   On: 05/12/2021 20:39        Scheduled Meds:  (feeding supplement) PROSource Plus  30 mL Oral TID BM   amoxicillin-clavulanate  500 mg Oral BID   apixaban  10 mg Oral BID   Followed by   Derrill Memo ON 05/19/2021] apixaban  5 mg Oral BID   buPROPion  150 mg Oral q morning   chlorhexidine  15 mL Mouth Rinse BID   Chlorhexidine Gluconate Cloth  6 each Topical Daily   [START ON 05/15/2021] furosemide  20 mg Intravenous Daily   mouth rinse  15 mL Mouth Rinse q12n4p   metoprolol succinate  25 mg Oral Daily   multivitamin with minerals  1 tablet Oral Daily   [START ON 05/15/2021] potassium chloride  10 mEq Oral Daily   predniSONE  40 mg Oral Q breakfast   sodium chloride flush  3 mL Intravenous Q12H   sodium chloride flush  3 mL Intravenous Q12H   umeclidinium-vilanterol  1 puff Inhalation Daily   Continuous Infusions:  sodium chloride       LOS: 4 days    Georgette Shell, MD  05/14/2021, 1:56 PM

## 2021-05-14 NOTE — Progress Notes (Signed)
RT checked on pt to see how he was doing with his overnight pulse ox. Pt had removed his [place] and was holding it in his hand his O2 sats were 85 to 86. Placed [place] back in his nose and told him not to remove it. Nurse tech told me that he removed his pulse ox one time and she had to place in back on his finger.

## 2021-05-14 NOTE — Progress Notes (Signed)
Pt awake through out the night and only slept for about 30 mins this am around 5. Pt continue to be confused, irritable and fidgety removing telemetry lines, oxygen and couldn't tolerate the BiPAP through the night. It was off pt. May be pt might benefit from some prn medication? P. Amo Devone Tousley RN.

## 2021-05-14 NOTE — Progress Notes (Signed)
Pt refused cpap tonight. No resp distress noted. Pt is laying down comfortably. Pt placed on overnight pulse ox.

## 2021-05-15 ENCOUNTER — Ambulatory Visit: Payer: Medicare Other

## 2021-05-15 LAB — MAGNESIUM: Magnesium: 1.9 mg/dL (ref 1.7–2.4)

## 2021-05-15 LAB — BASIC METABOLIC PANEL
Anion gap: 9 (ref 5–15)
BUN: 42 mg/dL — ABNORMAL HIGH (ref 8–23)
CO2: 39 mmol/L — ABNORMAL HIGH (ref 22–32)
Calcium: 9.5 mg/dL (ref 8.9–10.3)
Chloride: 87 mmol/L — ABNORMAL LOW (ref 98–111)
Creatinine, Ser: 0.72 mg/dL (ref 0.61–1.24)
GFR, Estimated: >60 mL/min (ref >60)
Glucose, Bld: 157 mg/dL — ABNORMAL HIGH (ref 70–99)
Potassium: 3.5 mmol/L (ref 3.5–5.1)
Sodium: 135 mmol/L (ref 135–145)

## 2021-05-15 LAB — CBC
HCT: 36.4 % — ABNORMAL LOW (ref 39.0–52.0)
Hemoglobin: 12.1 g/dL — ABNORMAL LOW (ref 13.0–17.0)
MCH: 31.6 pg (ref 26.0–34.0)
MCHC: 33.2 g/dL (ref 30.0–36.0)
MCV: 95 fL (ref 80.0–100.0)
Platelets: 361 10*3/uL (ref 150–400)
RBC: 3.83 MIL/uL — ABNORMAL LOW (ref 4.22–5.81)
RDW: 14.3 % (ref 11.5–15.5)
WBC: 11.4 10*3/uL — ABNORMAL HIGH (ref 4.0–10.5)
nRBC: 0 % (ref 0.0–0.2)

## 2021-05-15 LAB — BLOOD GAS, VENOUS
Acid-Base Excess: 12.9 mmol/L — ABNORMAL HIGH (ref 0.0–2.0)
Bicarbonate: 40.3 mmol/L — ABNORMAL HIGH (ref 20.0–28.0)
O2 Saturation: 30.4 %
Patient temperature: 98.6
pCO2, Ven: 65.9 mmHg — ABNORMAL HIGH (ref 44.0–60.0)
pH, Ven: 7.403 (ref 7.250–7.430)
pO2, Ven: 24 mmHg — CL (ref 32.0–45.0)

## 2021-05-15 LAB — AMMONIA: Ammonia: <10 umol/L (ref 9–35)

## 2021-05-15 MED ORDER — PREDNISONE 5 MG PO TABS: 5.0000 mg | ORAL_TABLET | Freq: Every day | ORAL | Status: DC

## 2021-05-15 MED ORDER — PREDNISONE 20 MG PO TABS: 20.0000 mg | ORAL_TABLET | Freq: Every day | ORAL | Status: DC

## 2021-05-15 MED ORDER — PREDNISONE 5 MG PO TABS: 10.0000 mg | ORAL_TABLET | Freq: Every day | ORAL | Status: DC

## 2021-05-15 MED ORDER — PREDNISONE 5 MG PO TABS
30.0000 mg | ORAL_TABLET | Freq: Every day | ORAL | Status: DC
  Administered 2021-05-16: 30 mg via ORAL
  Filled 2021-05-15: qty 2

## 2021-05-15 MED ORDER — FUROSEMIDE 10 MG/ML IJ SOLN
40.0000 mg | Freq: Two times a day (BID) | INTRAMUSCULAR | Status: DC
  Administered 2021-05-15 (×2): 40 mg via INTRAVENOUS
  Filled 2021-05-15 (×2): qty 4

## 2021-05-15 NOTE — TOC Progression Note (Signed)
Transition of Care [facility]) - Progression Note    Patient Details  Name: [name] MRN: [id number] Date of Birth: 1950-11-11  Transition of Care [facility]) CM/SW Contact  Rody Keadle, Juliann Pulse, RN Phone Number: 05/15/2021, 1:28 PM  Clinical Narrative: Noted [facility];[employer] for Non invasive vent, & home 02 once ordered-will need qualifying 02 sats within 48hrs of d/c-noted patient can d/c prior NIV arriving in the home([employer] is already working on this)      Expected Discharge Plan: [place] Barriers to Discharge: Continued Medical Work up  Expected Discharge Plan and Services Expected Discharge Plan: [place]                                               Social Determinants of Health (SDOH) Interventions    Readmission Risk Interventions No flowsheet data found.

## 2021-05-15 NOTE — Progress Notes (Signed)
PROGRESS NOTE    Tag Wurtz  VPX:[id number] DOB: 20-Feb-1951 DOA: 05/10/2021 PCP: Janie Morning, DO    Brief Narrative:  [name] is a 70 year old male with past medical history significant for advanced/end-stage COPD, essential hypertension, non-small cell lung cancer stage Ia LUL/RLL (dx 05/01/21), pulmonary hypertension, tobacco use disorder, anxiety, PAD, chronic diastolic congestive heart failure, severe protein calorie malnutrition who presented to [facility] ED on 12/2 via EMS from home with confusion, increased fatigue, shortness of breath, and productive cough of green sputum.  Patient and significant other reported loss of appetite with increased fatigue, cough with progressive confusion.  Patient was reportedly found to be saturating 60s on room air at home and brought to the ED on supplemental oxygen for further evaluation.  In the ED, patient was afebrile with SPO2 upper 80s-low 90s on 4 L nasal cannula with stable blood pressure.  Sodium at 125, CBC with mild normocytic anemia.  CT angiogram chest with chronic appearing PE right lower lobe pulmonary artery and bilateral changes consistent with known lung carcinoma.  BNP elevated 1941.  Blood cultures collected.  Patient was started on IV Lasix and IV heparin in the ED.  Hospital service consulted for further evaluation and management of acute hypoxic respiratory failure   Assessment & Plan:   Principal Problem:   Acute respiratory failure with hypoxia and hypercarbia (HCC) Active Problems:   Hyponatremia   COPD with acute exacerbation (HCC)   Essential hypertension   PAD (peripheral artery disease) (HCC)   Acute metabolic encephalopathy   Pulmonary embolism (HCC)   Lung cancer (HCC)   Acute metabolic encephalopathy, POA Patient presenting to the ED with progressive confusion, etiology likely multifactorial in the setting of hypoxia secondary to COPD exacerbation, decompensated CHF exacerbation and new diagnosis of lung  malignancy.  Patient initially required Precedex drip due to agitated encephalopathy which was titrated off. --Continue treatment as below  Acute on chronic hypoxemic/hypercarbic respiratory failure, POA Patient presenting to the ED and was found by EMS to have SPO2 in the 60s on room air.  Etiology likely multifactorial with acute on chronic COPD exacerbation and decompensated diastolic CHF exacerbation.  Patient will require the use of noninvasive ventilation to repeat his high PCO2 levels (54.2 with elevated bicarbonate of 32.8, which were taken after using BiPAP during current hospital stay).  Use of NIV can reduce risk of exacerbations and future hospitalizations when used at night and during the day.  Patient has been readmitted in less than 1 month following his last hospitalization and BiPAP has been proven ineffective to provide essential volume control necessary to maintain acceptable CO2 levels as ABGs demonstrate constant hypercapnia despite best efforts using BiPAP.  An NIV with IPAP's is necessary to prevent patient from life-threatening harm.  Interruption or failure to provide NIV would quickly lead to exasperation of patient's underlying respiratory status requiring readmissions and likely harm to the patient.  Patient is able to protect her airway and clear secretions on their own.  You also need home oxygen discharge. --Ambulatory O2 screentoday with notable hypoxia on standing requiring 8 L with SPO2 in the low 80s. --Continue treatment as below  COPD exacerbation --Augmentin 500-125 mg p.o. twice daily --Anoro Ellipta 1 puff daily (Breztri on d/c) --continue 2 week prednisone taper; down to 30mg  PO daily starting 46/2  Chronic diastolic congestive heart failure, decompensated BNP elevated on admission 1941. --net negative 1L past 24h and net negative 4.3L since admission --Lasix 40mg  IV q12h --repeat CXR in am --  Strict I's and O's and daily weights  NSCLC LUL/RLL Stage  1a Diagnosed 05/01/2021.  Radiation oncology, Dr. Tammi Klippel planning radiation to left upper lobe and right lower lobe.,  Treatment has yet to start. --Outpatient follow-up with pulmonology and radiation oncology; [place] and Dr. Tammi Klippel respectively  Pulmonary embolism, chronic --Eliquis  Essential hypertension At baseline on amlodipine and metoprolol. --Metoprolol succinate 25 mg p.o. daily --Continue monitor BP  Anxiety/depression: --Wellbutrin 150 mg p.o. daily  Hyponatremia Sodium level 125 and admission.  Etiology likely secondary to decompensated CHF with volume overload/hypervolemic hyponatremia.  On IV Lasix as above improving. --Na 125>>>133>135 --BMP daily  Severe protein calorie malnutrition Body mass index is 15.52 kg/m.  Nutrition Status: Nutrition Problem: Severe Malnutrition Etiology: chronic illness, cancer and cancer related treatments Signs/Symptoms: energy intake < or equal to 75% for > or equal to 1 month, severe fat depletion, severe muscle depletion Interventions: MVI, Snacks, Prostat --Dietitian following, appreciate assistance --Continue to encourage increased protein supplementation  Tobacco use disorder Patient with 50-year pack history.  Counseled on need for complete cessation.    DVT prophylaxis:  apixaban (ELIQUIS) tablet 10 mg  apixaban (ELIQUIS) tablet 5 mg    Code Status: DNR Family Communication: No family present at bedside this morning  Disposition Plan:  Level of care: Progressive Status is: Inpatient  Remains inpatient appropriate because: Continues to require high O2 demand, today requiring 8 L nasal cannula just standing at bedside.  Continues to be noncompliant with nocturnal BiPAP/CPAP and pulling off oxygen multiple times throughout the night.  Given his end-stage COPD, new diagnosis of malignancy and worsening respiratory status; overall prognosis extremely poor.  We will consult palliative care for further evaluation as he  would be more appropriate likely for hospice at this time.   Consultants:  PCCM - signed off 12/6 Palliative Care  Procedures:  none  Antimicrobials:  Augmentin 12/5>> Doxycycline 12/4 - 12/5 Ceftriaxone 12/3 - 12/4   Subjective: Patient seen examined bedside, resting comfortably.  No family present at bedside this morning.  No specific complaints this morning.  Remains slightly confused.  Overnight refused to wear BiPAP/CPAP and pulled his oxygen off multiple times.  Overnight SPO2 monitoring showed 170 saturations; likely related to noncompliance with oxygen.  Today when assessed for home O2, requiring 8 L nasal cannula while just standing at bedside with SPO2 in the 80s.  Currently unsafe for discharge home at this time.  Given patient's end-stage COPD, new diagnosis of lung cancer and decompensated CHF, with underlying severe cachexia/severe protein calorie malnutrition; his ultimate prognosis is extremely poor.  We will consult palliative care as patient likely will be more amenable to a hospice situation given his noncompliance with medical therapy.  Patient with no other complaints or concerns at this time.  Denies headache, no chest pain, no abdominal pain, no fever/chills/night sweats, no nausea/vomiting/diarrhea.  No other acute concerns overnight per nursing staff.  Objective: Vitals:   05/14/21 0913 05/14/21 1241 05/14/21 2005 05/15/21 1247  BP:  140/84 (!) 134/92 (!) 147/101  Pulse:  (!) 107 92 94  Resp:  18  18  Temp:   97.6 F (36.4 C)   TempSrc:   Oral   SpO2: 92%  92% 98%  Weight:      Height:        Intake/Output Summary (Last 24 hours) at 05/15/2021 1345 Last data filed at 05/15/2021 0600 Gross per 24 hour  Intake --  Output 650 ml  Net -650 ml  Filed Weights   05/10/21 1956  Weight: 38.5 kg    Examination:  General exam: Appears calm and comfortable; chronically ill/cachectic in appearance, appears older than stated age Respiratory system: Coarse  breath sounds bilaterally, slightly decreased probably bases, no wheezes/crackles, normal [place] effort, on 4 L nasal cannula at rest with SPO2 92% Cardiovascular system: S1 & S2 heard, RRR. No JVD, murmurs, rubs, gallops or clicks. No pedal edema. Gastrointestinal system: Abdomen is nondistended, soft and nontender. No organomegaly or masses felt. Normal bowel sounds heard. Central nervous system: Alert. No focal neurological deficits. Extremities: Moves all extremities independently, notable muscle wasting Skin: No rashes, lesions or ulcers Psychiatry: Judgement and insight appear poor mood & affect appropriate.     Data Reviewed: I have personally reviewed following labs and imaging studies  CBC: Recent Labs  Lab 05/10/21 2030 05/11/21 0745 05/12/21 0023 05/13/21 0337 05/14/21 0453 05/15/21 0451  WBC 5.9 5.9 13.6* 17.2* 14.5* 11.4*  NEUTROABS 4.4  --   --   --   --   --   HGB 11.1* 11.3* 11.0* 11.6* 12.8* 12.1*  HCT 33.2* 34.9* 32.1* 34.0* 37.3* 36.4*  MCV 94.6 99.1 92.8 92.4 93.0 95.0  PLT 279 260 295 362 374 709   Basic Metabolic Panel: Recent Labs  Lab 05/10/21 2030 05/11/21 0745 05/12/21 0023 05/13/21 0337 05/15/21 0451  NA 125* 125* 130* 133* 135  K 4.5 4.1 3.3* 3.1* 3.5  CL 87* 90* 91* 93* 87*  CO2 28 23 28  33* 39*  GLUCOSE 130* 163* 172* 157* 157*  BUN 39* 40* 43* 52* 42*  CREATININE 1.09 1.17 0.95 1.01 0.72  CALCIUM 9.1 8.8* 8.5* 9.0 9.5  MG  --   --  2.5* 2.2 1.9  PHOS  --   --  4.4 1.7*  --    GFR: Estimated Creatinine Clearance: 47.5 mL/min (by C-G formula based on SCr of 0.72 mg/dL). Liver Function Tests: Recent Labs  Lab 05/10/21 2030 05/11/21 0745  AST 51* 45*  ALT 38 36  ALKPHOS 101 108  BILITOT 0.5 0.5  PROT 7.8 7.2  ALBUMIN 3.5 3.2*   Recent Labs  Lab 05/10/21 2030  LIPASE 25   Recent Labs  Lab 05/11/21 0745 05/14/21 0453 05/15/21 0451  AMMONIA 76* 13 <10   Coagulation Profile: Recent Labs  Lab 05/11/21 0000  INR 1.1    Cardiac Enzymes: No results for input(s): CKTOTAL, CKMB, CKMBINDEX, TROPONINI in the last 168 hours. BNP (last 3 results) No results for input(s): PROBNP in the last 8760 hours. HbA1C: No results for input(s): HGBA1C in the last 72 hours. CBG: No results for input(s): GLUCAP in the last 168 hours. Lipid Profile: No results for input(s): CHOL, HDL, LDLCALC, TRIG, CHOLHDL, LDLDIRECT in the last 72 hours. Thyroid Function Tests: No results for input(s): TSH, T4TOTAL, FREET4, T3FREE, THYROIDAB in the last 72 hours. Anemia Panel: No results for input(s): VITAMINB12, FOLATE, FERRITIN, TIBC, IRON, RETICCTPCT in the last 72 hours. Sepsis Labs: Recent Labs  Lab 05/10/21 2030 05/11/21 0000  LATICACIDVEN 0.9 0.7    Recent Results (from the past 240 hour(s))  Blood culture (routine x 2)     Status: None (Preliminary result)   Collection Time: 05/10/21  8:30 PM   Specimen: BLOOD  Result Value Ref Range Status   Specimen Description   Final    BLOOD RIGHT ANTECUBITAL Performed at [facility] [address], [place], [place] [address]    Special Requests   Final  BOTTLES DRAWN AEROBIC AND ANAEROBIC Blood Culture results may not be optimal due to an excessive volume of blood received in culture bottles Performed at [facility], [place] [address], [place], [place] [address]    Culture   Final    NO GROWTH 4 DAYS Performed at [facility], [place] [address] [place], [place] [address]    Report Status PENDING  Incomplete  Blood culture (routine x 2)     Status: None (Preliminary result)   Collection Time: 05/10/21  8:30 PM   Specimen: BLOOD  Result Value Ref Range Status   Specimen Description   Final    BLOOD LEFT ANTECUBITAL Performed at [facility] [address] [place], [place] [address]    Special Requests   Final    BOTTLES DRAWN AEROBIC AND ANAEROBIC Blood Culture results may not be optimal due to an excessive  volume of blood received in culture bottles Performed at [facility] [address] [place], [place] [address]    Culture   Final    NO GROWTH 4 DAYS Performed at [facility], [place] [address], [place], [place] [address]    Report Status PENDING  Incomplete  Resp Panel by RT-PCR (Flu A&B, Covid)     Status: None   Collection Time: 05/10/21  8:30 PM  Result Value Ref Range Status   SARS Coronavirus 2 by RT PCR NEGATIVE NEGATIVE Final    Comment: (NOTE) SARS-CoV-2 target nucleic acids are NOT DETECTED.  The SARS-CoV-2 RNA is generally detectable in upper respiratory specimens during the acute phase of infection. The lowest concentration of SARS-CoV-2 viral copies this assay can detect is 138 copies/mL. A negative result does not preclude SARS-Cov-2 infection and should not be used as the sole basis for treatment or other patient management decisions. A negative result may occur with  improper specimen collection/handling, submission of specimen other than nasopharyngeal swab, presence of viral mutation(s) within the areas targeted by this assay, and inadequate number of viral copies(<138 copies/mL). A negative result must be combined with clinical observations, patient history, and epidemiological information. The expected result is Negative.  Fact Sheet for Patients:  EntrepreneurPulse.com.au  Fact Sheet for Healthcare Providers:  IncredibleEmployment.be  This test is no t yet approved or cleared by the [place] FDA and  has been authorized for detection and/or diagnosis of SARS-CoV-2 by FDA under an Emergency Use Authorization (EUA). This EUA will remain  in effect (meaning this test can be used) for the duration of the COVID-19 declaration under Section 564(b)(1) of the Act, 21 U.S.C.section 360bbb-3(b)(1), unless the authorization is terminated  or revoked sooner.       Influenza A by PCR NEGATIVE  NEGATIVE Final   Influenza B by PCR NEGATIVE NEGATIVE Final    Comment: (NOTE) The Xpert Xpress SARS-CoV-2/FLU/RSV plus assay is intended as an aid in the diagnosis of influenza from Nasopharyngeal swab specimens and should not be used as a sole basis for treatment. Nasal washings and aspirates are unacceptable for Xpert Xpress SARS-CoV-2/FLU/RSV testing.  Fact Sheet for Patients: EntrepreneurPulse.com.au  Fact Sheet for Healthcare Providers: IncredibleEmployment.be  This test is not yet approved or cleared by the [place] FDA and has been authorized for detection and/or diagnosis of SARS-CoV-2 by FDA under an Emergency Use Authorization (EUA). This EUA will remain in effect (meaning this test can be used) for the duration of the COVID-19 declaration under Section 564(b)(1) of the Act, 21  U.S.C. section 360bbb-3(b)(1), unless the authorization is terminated or revoked.  Performed at [facility], [place] [address], [place], [place] [address]   MRSA Next Gen by PCR, Nasal     Status: None   Collection Time: 05/11/21  1:53 AM   Specimen: Nasal Mucosa; Nasal Swab  Result Value Ref Range Status   MRSA by PCR Next Gen NOT DETECTED NOT DETECTED Final    Comment: (NOTE) The GeneXpert MRSA Assay (FDA approved for NASAL specimens only), is one component of a comprehensive MRSA colonization surveillance program. It is not intended to diagnose MRSA infection nor to guide or monitor treatment for MRSA infections. Test performance is not FDA approved in patients less than 82 years old. Performed at [facility], [place] [address], [place], [place] [address]   Respiratory (~20 pathogens) panel by PCR     Status: None   Collection Time: 05/11/21 10:49 AM   Specimen: Nasopharyngeal Swab; Respiratory  Result Value Ref Range Status   Adenovirus NOT DETECTED NOT DETECTED Final   Coronavirus 229E NOT DETECTED NOT  DETECTED Final    Comment: (NOTE) The Coronavirus on the Respiratory Panel, DOES NOT test for the novel  Coronavirus (2019 nCoV)    Coronavirus HKU1 NOT DETECTED NOT DETECTED Final   Coronavirus NL63 NOT DETECTED NOT DETECTED Final   Coronavirus OC43 NOT DETECTED NOT DETECTED Final   Metapneumovirus NOT DETECTED NOT DETECTED Final   Rhinovirus / Enterovirus NOT DETECTED NOT DETECTED Final   Influenza A NOT DETECTED NOT DETECTED Final   Influenza B NOT DETECTED NOT DETECTED Final   Parainfluenza Virus 1 NOT DETECTED NOT DETECTED Final   Parainfluenza Virus 2 NOT DETECTED NOT DETECTED Final   Parainfluenza Virus 3 NOT DETECTED NOT DETECTED Final   Parainfluenza Virus 4 NOT DETECTED NOT DETECTED Final   Respiratory Syncytial Virus NOT DETECTED NOT DETECTED Final   Bordetella pertussis NOT DETECTED NOT DETECTED Final   Bordetella Parapertussis NOT DETECTED NOT DETECTED Final   Chlamydophila pneumoniae NOT DETECTED NOT DETECTED Final   Mycoplasma pneumoniae NOT DETECTED NOT DETECTED Final    Comment: Performed at [facility], [place]. [address], [place], [place] [address]         Radiology Studies: No results found.      Scheduled Meds:  (feeding supplement) PROSource Plus  30 mL Oral TID BM   amoxicillin-clavulanate  500 mg Oral BID   apixaban  10 mg Oral BID   Followed by   Derrill Memo ON 05/19/2021] apixaban  5 mg Oral BID   buPROPion  150 mg Oral q morning   chlorhexidine  15 mL Mouth Rinse BID   Chlorhexidine Gluconate Cloth  6 each Topical Daily   furosemide  40 mg Intravenous BID   mouth rinse  15 mL Mouth Rinse q12n4p   metoprolol succinate  25 mg Oral Daily   multivitamin with minerals  1 tablet Oral Daily   potassium chloride  10 mEq Oral Daily   sodium chloride flush  3 mL Intravenous Q12H   umeclidinium-vilanterol  1 puff Inhalation Daily   Continuous Infusions:  sodium chloride       LOS: 5 days    Time spent: 39 minutes spent on chart review,  discussion with nursing staff, consultants, updating family and interview/physical exam; more than 50% of that time was spent in counseling and/or coordination of care.    Arland Usery J [place] DO [facility] Available via Epic secure chat 7am-7pm After  these hours, please refer to coverage provider listed on [email or web address] 05/15/2021, 1:45 PM

## 2021-05-15 NOTE — Progress Notes (Signed)
Overnight pulseox study completed and paper report placed in patient's chart.

## 2021-05-16 ENCOUNTER — Inpatient Hospital Stay (HOSPITAL_COMMUNITY): Payer: Medicare Other

## 2021-05-16 ENCOUNTER — Ambulatory Visit
Admission: RE | Admit: 2021-05-16 | Discharge: 2021-05-16 | Disposition: A | Discharge status: Home / Self Care | Payer: Medicare Other | Source: Ambulatory Visit | Attending: Radiation Oncology | Admitting: Radiation Oncology

## 2021-05-16 LAB — BASIC METABOLIC PANEL
Anion gap: 9 (ref 5–15)
BUN: 52 mg/dL — ABNORMAL HIGH (ref 8–23)
CO2: 37 mmol/L — ABNORMAL HIGH (ref 22–32)
Calcium: 9 mg/dL (ref 8.9–10.3)
Chloride: 87 mmol/L — ABNORMAL LOW (ref 98–111)
Creatinine, Ser: 0.9 mg/dL (ref 0.61–1.24)
GFR, Estimated: >60 mL/min (ref >60)
Glucose, Bld: 175 mg/dL — ABNORMAL HIGH (ref 70–99)
Potassium: 3.5 mmol/L (ref 3.5–5.1)
Sodium: 133 mmol/L — ABNORMAL LOW (ref 135–145)

## 2021-05-16 LAB — CULTURE, BLOOD (ROUTINE X 2)
Culture: NO GROWTH
Culture: NO GROWTH

## 2021-05-16 LAB — MAGNESIUM: Magnesium: 1.9 mg/dL (ref 1.7–2.4)

## 2021-05-16 MED ORDER — BREZTRI AEROSPHERE 160-9-4.8 MCG/ACT IN AERO: 2.0000 | INHALATION_SPRAY | Freq: Two times a day (BID) | RESPIRATORY_TRACT | 3 refills | Status: DC

## 2021-05-16 MED ORDER — ALBUTEROL SULFATE HFA 108 (90 BASE) MCG/ACT IN AERS
2.0000 | INHALATION_SPRAY | Freq: Four times a day (QID) | RESPIRATORY_TRACT | 3 refills | Status: AC | PRN
Start: 2021-05-16 — End: ?

## 2021-05-16 MED ORDER — METOPROLOL SUCCINATE ER 25 MG PO TB24: 25.0000 mg | ORAL_TABLET | Freq: Every day | ORAL | 2 refills | Status: AC

## 2021-05-16 MED ORDER — FUROSEMIDE 40 MG PO TABS: 40.0000 mg | ORAL_TABLET | Freq: Every day | ORAL | 2 refills | Status: DC

## 2021-05-16 MED ORDER — POTASSIUM CHLORIDE CRYS ER 10 MEQ PO TBCR: 10.0000 meq | EXTENDED_RELEASE_TABLET | Freq: Every day | ORAL | 2 refills | Status: DC

## 2021-05-16 MED ORDER — PREDNISONE 10 MG PO TABS: ORAL_TABLET | ORAL | 0 refills | Status: DC

## 2021-05-16 MED ORDER — AMOXICILLIN-POT CLAVULANATE 500-125 MG PO TABS: 500.0000 mg | ORAL_TABLET | Freq: Two times a day (BID) | ORAL | 0 refills | Status: AC

## 2021-05-16 MED ORDER — APIXABAN 5 MG PO TABS: ORAL_TABLET | ORAL | 0 refills | Status: DC

## 2021-05-16 MED ORDER — FUROSEMIDE 40 MG PO TABS
40.0000 mg | ORAL_TABLET | Freq: Two times a day (BID) | ORAL | Status: DC
  Administered 2021-05-16: 40 mg via ORAL
  Filled 2021-05-16: qty 1

## 2021-05-16 NOTE — Progress Notes (Signed)
Went over discharge papers with patient and family.  All questions answered.  Portable oxygen in the room.  AVS given.  Pt wheeled out by NT.

## 2021-05-16 NOTE — Progress Notes (Signed)
Physical Therapy Treatment Patient Details Name: [name] MRN: [id number] DOB: May 07, 1951 Today's Date: 05/16/2021   History of Present Illness 70 year old male with history of lung CA non-small cell lung cancer in the left upper lobe and right lower lobe diagnosed 05/01/2021, end-stage COPD hypertension PAD anxiety admitted with hypoxia and confusion.  Patient lives at home with his significant other who called EMS as patient was more confused with cough decreased appetite and fatigue.  Patient was saturating in the 60s on room air with EMS.  Work-up in the ED revealed CT angiogram of the chest with single chronic appearing pulmonary embolism and right lower lobe bilateral changes consistent with known lung carcinoma.    PT Comments    Pt very eager to be OOB and ambulated in hallway with RW.  Pt did not require any physical assist or steadying.  Attempted to monitor O2 with dynamap, however pt with cold hands and poor waveform for most of session.  Pt ambulated on 3L O2 [place] (see below) and denies increased SOB or symptoms.  Pt hopeful for d/c home soon.    Recommendations for follow up therapy are one component of a multi-disciplinary discharge planning process, led by the attending physician.  Recommendations may be updated based on patient status, additional functional criteria and insurance authorization.  Follow Up Recommendations  Home health PT     Assistance Recommended at Discharge Intermittent Supervision/Assistance  Equipment Recommendations  None recommended by PT    Recommendations for Other Services       Precautions / Restrictions Precautions Precautions: Fall Precaution Comments: monitor sats     Mobility  Bed Mobility Overal bed mobility: Needs Assistance Bed Mobility: Supine to Sit     Supine to sit: Supervision          Transfers Overall transfer level: Needs assistance Equipment used: Rolling walker (2 wheels) Transfers: Sit to/from Stand Sit  to Stand: Min guard           General transfer comment: min/guard for safety    Ambulation/Gait Ambulation/Gait assistance: Min guard Gait Distance (Feet): 120 Feet Assistive device: Rolling walker (2 wheels) Gait Pattern/deviations: Step-through pattern;Decreased stride length       General Gait Details: pt steady with RW, Pt with poor waveform with monitoring O2 and has cold hands however denies increased symptoms with mobility; SPO2 dropped to 70s with ambulating on room air and Spo2 in 80s on 3L O2 [place] (with poor waveform and inaccurate HR) however also was 100% on 3L when HR matched telemetry and hands warmed prior to pulse ox monitor placement   Stairs             Wheelchair Mobility    Modified Rankin (Stroke Patients Only)       Balance                                            Cognition Arousal/Alertness: Awake/alert Behavior During Therapy: WFL for tasks assessed/performed Overall Cognitive Status: Within Functional Limits for tasks assessed                                          Exercises      General Comments        Pertinent Vitals/Pain Pain Assessment: No/denies pain  Home Living                          Prior Function            PT Goals (current goals can now be found in the care plan section) Progress towards PT goals: Progressing toward goals    Frequency    Min 3X/week      PT Plan Current plan remains appropriate    Co-evaluation              AM-PAC PT "6 Clicks" Mobility   Outcome Measure  Help needed turning from your back to your side while in a flat bed without using bedrails?: A Little Help needed moving from lying on your back to sitting on the side of a flat bed without using bedrails?: A Little Help needed moving to and from a bed to a chair (including a wheelchair)?: A Little Help needed standing up from a chair using your arms (e.g., wheelchair or  bedside chair)?: A Little Help needed to walk in hospital room?: A Little Help needed climbing 3-5 steps with a railing? : A Little 6 Click Score: 18    End of Session Equipment Utilized During Treatment: Gait belt;Oxygen Activity Tolerance: Patient tolerated treatment well Patient left: with call bell/phone within reach;with family/visitor present;in bed Nurse Communication: Mobility status PT Visit Diagnosis: Other abnormalities of gait and mobility (R26.89)     Time: 0940-1003 PT Time Calculation (min) (ACUTE ONLY): 23 min  Charges:  $Gait Training: 8-22 mins                    Arlyce Dice, DPT Acute Rehabilitation Services Pager: [phone] Office: [place] 05/16/2021, 1:21 PM

## 2021-05-16 NOTE — TOC Transition Note (Signed)
Transition of Care [facility]) - CM/SW Discharge Note   Patient Details  Name: [name] MRN: [id number] Date of Birth: [birth year]  [facility] CM/SW Contact:  Dessa Phi, RN Phone Number: 05/16/2021, 11:46 AM   Clinical Narrative: d/c home w/HHC-[facility] rep Ramond Marrow aware start of care on Saturday;[employer] rep Zelphia [place] to deliver  home 02 travel tank to rm prior d/c-NIV already ordered to be delivered to home after d/c. No further CM needs.    Final next level of care: [place] Barriers to Discharge: No Barriers Identified   Patient Goals and CMS Choice Patient states their goals for this hospitalization and ongoing recovery are:: go home CMS Medicare.gov Compare Post Acute Care list provided to:: Patient Choice offered to / list presented to : Patient  Discharge Placement                       Discharge Plan and Services   Discharge Planning Services: CM Consult Post Acute Care Choice: Home Health          DME Arranged: Oxygen, NIV DME Agency: [employer] Date DME Agency Contacted: 05/16/21 Time DME Agency Contacted: [id number] Representative spoke with at DME Agency: [place]: PT [place]: [place] ([place]) Date [place]: 05/16/21 Time [place]: [id number] Representative spoke with at [place]: [place] ([place]) Interventions     Readmission Risk Interventions No flowsheet data found.

## 2021-05-16 NOTE — Progress Notes (Signed)
Palliative:  Consult received and chart reviewed. Discharging today - will refer to outpatient palliative. Discussed with Dr. [place]  Juel Burrow, DNP, AGNP-C Palliative Medicine Team Team Phone # [phone]  Pager # [phone]  NO CHARGE

## 2021-05-16 NOTE — Progress Notes (Signed)
86SATURATION QUALIFICATIONS: (This note is used to comply with regulatory documentation for home oxygen)  Patient Saturations on Room Air at Rest = 86%  Patient Saturations on Room Air while Ambulating = 84%  Patient Saturations on 3 Liters of oxygen while Ambulating = 92%  Please briefly explain why patient needs home oxygen:

## 2021-05-16 NOTE — Progress Notes (Signed)
[facility] Liaison Note   Notified by PMT of patient/family request of [facility] Paliative services.   [facility] hospital liaison will follow patient for discharge disposition.    Please call with any questions/concerns.    Thank you for the opportunity to participate in this patient's care.   Daphene Calamity, MSW [facility] Liaison  [phone]

## 2021-05-16 NOTE — Progress Notes (Signed)
Pt continues to refuse BiPAP qhs.  Pt encouraged to contact RT should he change his mind.

## 2021-05-16 NOTE — Plan of Care (Signed)
  Problem: Health Behavior/Discharge Planning: Goal: Ability to manage health-related needs will improve Outcome: Not Progressing   

## 2021-05-16 NOTE — Discharge Summary (Signed)
Physician Discharge Summary  Llewyn Heap TDV:[id number] DOB: October 04, 1950 DOA: 05/10/2021  PCP: Janie Morning, DO  Admit date: 05/10/2021 Discharge date: 05/16/2021  Admitted From: Home Disposition: Home  Recommendations for Outpatient Follow-up:  Follow up with PCP in 1-2 weeks Follow-up with pulmonology, Tammy Parrett on 05/28/2021 at 3 PM as scheduled Outpatient follow-up with medical oncology/radiation oncology as scheduled Please obtain BMP in one week to assess renal function Please follow up on the following pending results:  Home Health: PT/OT Equipment/Devices: Oxygen, 3 L per nasal cannula, NIV -will be delivered following discharge  Discharge Condition: Stable; but overall poor prognosis CODE STATUS: DNR Diet recommendation: Heart healthy diet  History of present illness:  [name] is a 70 year old male with past medical history significant for advanced/end-stage COPD, essential hypertension, non-small cell lung cancer stage Ia LUL/RLL (dx 05/01/21), pulmonary hypertension, tobacco use disorder, anxiety, PAD, chronic diastolic congestive heart failure, severe protein calorie malnutrition who presented to [facility] ED on 12/2 via EMS from home with confusion, increased fatigue, shortness of breath, and productive cough of green sputum.  Patient and significant other reported loss of appetite with increased fatigue, cough with progressive confusion.  Patient was reportedly found to be saturating 60s on room air at home and brought to the ED on supplemental oxygen for further evaluation.   In the ED, patient was afebrile with SPO2 upper 80s-low 90s on 4 L nasal cannula with stable blood pressure.  Sodium at 125, CBC with mild normocytic anemia.  CT angiogram chest with chronic appearing PE right lower lobe pulmonary artery and bilateral changes consistent with known lung carcinoma.  BNP elevated 1941.  Blood cultures collected.  Patient was started on IV Lasix and IV heparin in the  ED.  Hospital service consulted for further evaluation and management of acute hypoxic respiratory failure  Hospital course:  Acute metabolic encephalopathy, POA Patient presenting to the ED with progressive confusion, etiology likely multifactorial in the setting of hypoxia secondary to COPD exacerbation, decompensated CHF exacerbation and new diagnosis of lung malignancy.  Patient initially required Precedex drip due to agitated encephalopathy which was titrated off.  Patient's encephalopathy has now resolved and his mentation is back at his normal baseline.   Acute on chronic hypoxemic/hypercarbic respiratory failure, POA Patient presenting to the ED and was found by EMS to have SPO2 in the 60s on room air.  Etiology likely multifactorial with acute on chronic COPD exacerbation and decompensated diastolic CHF exacerbation.  Patient will require the use of noninvasive ventilation to repeat his high PCO2 levels (54.2 with elevated bicarbonate of 32.8, which were taken after using BiPAP during current hospital stay).  Use of NIV can reduce risk of exacerbations and future hospitalizations when used at night and during the day.  Patient has been readmitted in less than 1 month following his last hospitalization and BiPAP has been proven ineffective to provide essential volume control necessary to maintain acceptable CO2 levels as ABGs demonstrate constant hypercapnia despite best efforts using BiPAP.  An NIV with IPAP's is necessary to prevent patient from life-threatening harm.  Interruption or failure to provide NIV would quickly lead to exasperation of patient's underlying respiratory status requiring readmissions and likely harm to the patient.  Patient is able to protect her airway and clear secretions on their own.  Discharging home on home O2.  NIV to be delivered to patient's home following discharge per social work.   COPD exacerbation Will continue Augmentin 500-125 mg p.o. twice daily to  complete  10-day course on discharge.  Continue home Breztri.  Continue prednisone taper.  Continues on 3 L nasal cannula on discharge.  Outpatient follow-up has been set up with pulmonology following discharge.   Chronic diastolic congestive heart failure, decompensated BNP elevated on admission 1941.  Diuresed with IV Lasix during hospitalization.  Continue Lasix 40 mg p.o. daily on discharge.  Recommend repeat BMP 1 week.   NSCLC LUL/RLL Stage 1a Diagnosed 05/01/2021.  Radiation oncology, Dr. Tammi Klippel planning radiation to left upper lobe and right lower lobe.,  outpatient follow-up with pulmonology and radiation oncology; Dr. Valeta Harms and Dr. Tammi Klippel respectively   Pulmonary embolism, chronic Started on Eliquis.   Essential hypertension At baseline on amlodipine and metoprolol.  Amlodipine discontinued.  Continue metoprolol succinate 25 mg p.o. daily.   Anxiety/depression: Wellbutrin 150 mg p.o. daily   Hyponatremia Sodium level 125 and admission.  Etiology likely secondary to decompensated CHF with volume overload/hypervolemic hyponatremia.  Improved with diuresis.  Sodium 133 at time of discharge.  Recommend BMP 1 week.  Severe protein calorie malnutrition Body mass index is 15.52 kg/m.  Nutrition Status: Nutrition Problem: Severe Malnutrition Etiology: chronic illness, cancer and cancer related treatments Signs/Symptoms: energy intake < or equal to 75% for > or equal to 1 month, severe fat depletion, severe muscle depletion Interventions: MVI, Snacks, Prostat Dietitian was consulted and followed during hospitalization. Continue to encourage increased protein supplementation   Tobacco use disorder Patient with 50-year pack history.  Counseled on need for complete cessation.  Discharge Diagnoses:  Principal Problem:   Acute respiratory failure with hypoxia and hypercarbia (HCC) Active Problems:   Hyponatremia   COPD with acute exacerbation (HCC)   Essential hypertension   PAD  (peripheral artery disease) (HCC)   Pulmonary embolism (HCC)   Lung cancer [facility])    Discharge Instructions  Discharge Instructions     Call MD for:  difficulty breathing, headache or visual disturbances   Complete by: As directed    Call MD for:  extreme fatigue   Complete by: As directed    Call MD for:  persistant dizziness or light-headedness   Complete by: As directed    Call MD for:  persistant nausea and vomiting   Complete by: As directed    Call MD for:  severe uncontrolled pain   Complete by: As directed    Call MD for:  temperature >100.4   Complete by: As directed    Diet - low sodium heart healthy   Complete by: As directed    Increase activity slowly   Complete by: As directed       Allergies as of 05/16/2021       Reactions   Prevacid [lansoprazole] Other (See Comments)   Bloating ("became swollen with gas")   Varenicline Nausea And Vomiting, Other (See Comments)   Vivid, bad dreams also   Ciprofloxacin Other (See Comments)   GI Upset   Mirtazapine Other (See Comments)   Negatively affected sleep   Omeprazole Other (See Comments)   "Gas"        Medication List     STOP taking these medications    amLODipine 10 MG tablet Commonly known as: NORVASC   ipratropium-albuterol 0.5-2.5 (3) MG/3ML Soln Commonly known as: DUONEB       TAKE these medications    albuterol (2.5 MG/3ML) 0.083% nebulizer solution Commonly known as: PROVENTIL Take 3 mLs (2.5 mg total) by nebulization every 2 (two) hours as needed for wheezing or shortness of breath.  albuterol 108 (90 Base) MCG/ACT inhaler Commonly known as: VENTOLIN HFA Inhale 2 puffs into the lungs every 6 (six) hours as needed for shortness of breath.   Aleve 220 MG tablet Generic drug: naproxen sodium Take 220-440 mg by mouth 2 (two) times daily as needed (for mild pain or headaches).   amoxicillin-clavulanate 500-125 MG tablet Commonly known as: AUGMENTIN Take 1 tablet (500 mg total) by  mouth 2 (two) times daily for 2 days.   apixaban 5 MG Tabs tablet Commonly known as: Eliquis Take 2 tablets (10mg ) twice daily for 7 days, then 1 tablet (5mg ) twice daily   Breztri Aerosphere 160-9-4.8 MCG/ACT Aero Generic drug: Budeson-Glycopyrrol-Formoterol Inhale 2 puffs into the lungs 2 (two) times daily.   BROMPHENIRAMINE-DM-PSE PO Take 5 mLs by mouth 3 (three) times daily as needed (for coughing).   buPROPion 150 MG 24 hr tablet Commonly known as: WELLBUTRIN XL Take 150 mg by mouth every morning.   CALCIUM PO Take 1 tablet by mouth daily.   CARNATION BREAKFAST ESSENTIALS PO Take 1 packet by mouth daily.   lactose free nutrition Liqd Take 237 mLs by mouth 3 (three) times daily between meals.   fluticasone 50 MCG/ACT nasal spray Commonly known as: FLONASE Place 2 sprays into both nostrils daily as needed for allergies.   furosemide 40 MG tablet Commonly known as: LASIX Take 1 tablet (40 mg total) by mouth daily.   ibuprofen 200 MG tablet Commonly known as: ADVIL Take 200-400 mg by mouth every 8 (eight) hours as needed for mild pain or headache.   ipratropium 0.02 % nebulizer solution Commonly known as: ATROVENT Take 0.25-0.5 mg by nebulization every 6 (six) hours as needed for wheezing or shortness of breath.   metoprolol succinate 25 MG 24 hr tablet Commonly known as: TOPROL-XL Take 1 tablet (25 mg total) by mouth daily. Start taking on: May 17, 2021   multivitamin with minerals Tabs tablet Take 1 tablet by mouth every morning.   potassium chloride 10 MEQ tablet Commonly known as: KLOR-CON M Take 1 tablet (10 mEq total) by mouth daily. Start taking on: May 17, 2021   predniSONE 10 MG tablet Commonly known as: DELTASONE Take 3 tablets (30 mg total) by mouth daily for 3 days, THEN 2 tablets (20 mg total) daily for 4 days, THEN 1 tablet (10 mg total) daily for 4 days. Start taking on: May 17, 2021   VITAMIN C PO Take 1 tablet by mouth  daily.   VITAMIN D-3 PO Take 1 capsule by mouth daily.   ZINC PO Take 1 tablet by mouth daily.               Durable Medical Equipment  (From admission, onward)           Start     Ordered   05/16/21 1132  For home use only DME oxygen  Once       Comments: POC eval  Question Answer Comment  Length of Need Lifetime   Mode or (Route) Nasal cannula   Liters per Minute 3   Frequency Continuous (stationary and portable oxygen unit needed)   Oxygen conserving device Yes   Oxygen delivery system Gas      05/16/21 1132   05/15/21 1122  For home use only DME Bipap  Once       Question:  Length of Need  Answer:  Lifetime   05/15/21 1122            Follow-up  Information     [facility]. Go on 05/28/2021.   Specialty: Pulmonology Why: You have a follow up appt. scheduled with [facility] at 3:00pm with Tammy Parrett on 05/28/21 Contact information: [address] [place] [address] [place], [place] Follow up.   Why: [facility] start of care Saturday Contact information: [address] [place] [address] [phone]         [facility] Follow up.   Why: Non invasive Vent-delivery to home after d/c;home oxygen Contact information: [address] [place] [address] [phone]         Janie Morning, DO. Schedule an appointment as soon as possible for a visit in 1 week(s).   Specialty: Family Medicine Contact information: [address] [place] [address] [phone]                Allergies  Allergen Reactions   Prevacid [Lansoprazole] Other (See Comments)    Bloating ("became swollen with gas")   Varenicline Nausea And Vomiting and Other (See Comments)    Vivid, bad dreams also   Ciprofloxacin Other (See Comments)    GI Upset   Mirtazapine Other (See Comments)    Negatively  affected sleep   Omeprazole Other (See Comments)    "Gas"    Consultations: PCCM   Procedures/Studies: DG Chest 1 View  Result Date: 05/12/2021 CLINICAL DATA:  Hypoxia EXAM: CHEST  1 VIEW COMPARISON:  05/10/2021 FINDINGS: Cardiac shadow is within normal limits. Lungs are well aerated bilaterally. Increasing airspace opacity in the left upper lobe is noted consistent with acute on chronic infiltrate. No sizable effusion is seen. IMPRESSION: Acute on chronic infiltrate in the left upper lobe. Some degree of volume loss is noted. Electronically Signed   By: Inez Catalina M.D.   On: 05/12/2021 20:39   DG Chest 2 View  Result Date: 05/10/2021 CLINICAL DATA:  Shortness of breath EXAM: CHEST - 2 VIEW COMPARISON:  04/23/2021 FINDINGS: Cardiac shadow is within normal limits. Aortic calcifications are noted. Mild vascular congestion is noted with minimal edema. Lungs are hyperinflated. Stable fiducial marker is noted in the left lower lung. No focal infiltrate is seen. IMPRESSION: Mild interstitial edema. Electronically Signed   By: Inez Catalina M.D.   On: 05/10/2021 21:35   CT Angio Chest PE W and/or Wo Contrast  Result Date: 05/10/2021 CLINICAL DATA:  Known history of lung carcinoma with productive cough EXAM: CT ANGIOGRAPHY CHEST WITH CONTRAST TECHNIQUE: Multidetector CT imaging of the chest was performed using the standard protocol during bolus administration of intravenous contrast. Multiplanar CT image reconstructions and MIPs were obtained to evaluate the vascular anatomy. CONTRAST:  47mL OMNIPAQUE IOHEXOL 350 MG/ML SOLN COMPARISON:  04/18/2021 FINDINGS: Cardiovascular: Atherosclerotic calcifications of the thoracic aorta are noted without aneurysmal dilatation or dissection. No cardiac enlargement is seen. Coronary calcifications are noted. Pulmonary artery shows a normal branching pattern bilaterally. Tiny filling defect is noted in the medial right lung base best seen on image number 199 of series  5 which appears to represent a more chronic adherent pulmonary embolus. No other filling defect is identified. Mediastinum/Nodes: Thoracic inlet is within normal limits. No sizable hilar or mediastinal adenopathy is noted. Stable small nodes are noted in the AP window. Esophagus is within normal limits. Lungs/Pleura: Significant emphysematous changes are noted similar to that seen on the prior exam. In  the right upper lobe along the major fissure best seen on image number 48 of series 6 there is a 9 by 5 mm nodule identified. The overall size is decreased from the prior examination although the degree of solid component has increased with stable deformity of the major fissure. The nodule in the lateral aspect of the right upper lobe is again seen on image number 67 of series 6 but decreased in size now measuring approximately 8 mm in greatest dimension. Bronchial wall thickening remains in the right lower lobe with evidence of right basilar atelectasis. Calcified granuloma is noted in the right lower lobe as well. The nodular density in the medial posterior right lower lobe is obscured by the atelectatic changes. In the left upper lobe, the area of previously described post radiation changes is again identified with a significant increase in solid component when compared with the prior exam. This measures approximately 2.2 x 1.6 cm in greatest dimension. This increase in solid component may represent some retraction of the post irradiation changes. The previously seen nodule in the left lower lobe now measures 10 x 5 mm slightly decreased in size when compared with the prior exam. Left basilar atelectasis is noted. Upper Abdomen: Visualized upper abdomen is within normal limits. Musculoskeletal: Degenerative changes of the thoracic spine are noted. No acute rib fracture is seen. Review of the MIP images confirms the above findings. IMPRESSION: Single chronic appearing pulmonary embolus in the right lower lobe  pulmonary artery. No other acute appearing pulmonary emboli are noted. Changes in the lungs bilaterally consistent with the patient's known history of lung carcinoma. The previously seen left lower lobe nodule anteriorly has decreased slightly in the interval when compare with the prior exam. Increase in solid component within the right upper lobe nodule along the major fissure. This may represent some focal progression of disease. Attention on follow-up exam is recommended. Increase in solid component within the known left upper lobe changes previously described and is post radiation fibrosis. This may also represent some focal local progression of disease. Aortic Atherosclerosis (ICD10-I70.0) and Emphysema (ICD10-J43.9). Electronically Signed   By: Inez Catalina M.D.   On: 05/10/2021 22:21   DG CHEST PORT 1 VIEW  Result Date: 05/16/2021 CLINICAL DATA:  Shortness of breath EXAM: PORTABLE CHEST 1 VIEW COMPARISON:  05/12/2021 FINDINGS: Airspace disease again seen in the left upper lobe, unchanged. No confluent opacity on the right. Heart is normal size. No effusions or acute bony abnormality. IMPRESSION: Continued consolidation in the left upper lobe, unchanged. Electronically Signed   By: Rolm Baptise M.D.   On: 05/16/2021 08:21   DG CHEST PORT 1 VIEW  Result Date: 04/23/2021 CLINICAL DATA:  Bronchoscopy EXAM: PORTABLE CHEST 1 VIEW COMPARISON:  04/18/2021, 07/26/2018 FINDINGS: Heart size is normal. Aortic atherosclerosis. Fiducial marker in the left lower lobe adjacent to known pulmonary nodule. Areas of nodular thickening within both lungs, unchanged. Lungs are hyperexpanded. No new focal airspace consolidation, pleural effusion, or pneumothorax. IMPRESSION: 1. No acute cardiopulmonary findings status post bronchoscopy. 2. Fiducial marker in the left lower lobe adjacent to known pulmonary nodule. 3. Emphysema. Electronically Signed   By: Davina Poke D.O.   On: 04/23/2021 13:05   DG C-Arm 1-60 Min-No  Report  Result Date: 04/23/2021 Fluoroscopy was utilized by the requesting physician.  No radiographic interpretation.   ECHOCARDIOGRAM COMPLETE  Result Date: 05/11/2021    ECHOCARDIOGRAM REPORT   Patient Name:   [name] Date of Exam: 05/11/2021 Medical Rec #:  [id number]       Height:       62.0 in Accession #:    [id number]      Weight:       84.9 lb Date of Birth:  [birth year]       BSA:          1.328 m Patient Age:    89 years        BP:           127/59 mmHg Patient Gender: M               HR:           74 bpm. Exam Location:  Inpatient Procedure: 2D Echo, Cardiac Doppler and Color Doppler Indications:     Pulmonary Embolus I26.09  History:         Patient has no prior history of Echocardiogram examinations.                  COPD and Pulmonary HTN; Risk Factors:Hypertension and Current                  Smoker.  Sonographer:     Bernadene Person RDCS Referring Phys:  [id number] Ilene Qua OPYD Diagnosing Phys: Vernell Leep MD  Sonographer Comments: Echo performed with patient supine and on artificial respirator. IMPRESSIONS  1. Left ventricular ejection fraction, by estimation, is 60 to 65%. The left ventricle has normal function. The left ventricle has no regional wall motion abnormalities. Left ventricular diastolic parameters are consistent with Grade III diastolic dysfunction (restrictive).  2. Right ventricular systolic function is low normal. The right ventricular size is mildly enlarged. There is severely elevated pulmonary artery systolic pressure. Estimated PASP 64 mmHg.  3. Left atrial size was mildly dilated.  4. The mitral valve is grossly normal. Trivial mitral valve regurgitation.  5. Tricuspid valve regurgitation is moderate to severe.  6. The aortic valve is tricuspid. Aortic valve regurgitation is not visualized.  7. The inferior vena cava is dilated in size with >50% respiratory variability, suggesting right atrial pressure of 8 mmHg. FINDINGS  Left Ventricle: Left ventricular  ejection fraction, by estimation, is 60 to 65%. The left ventricle has normal function. The left ventricle has no regional wall motion abnormalities. The left ventricular internal cavity size was normal in size. There is  no left ventricular hypertrophy. Left ventricular diastolic parameters are consistent with Grade III diastolic dysfunction (restrictive). Right Ventricle: The right ventricular size is mildly enlarged. No increase in right ventricular wall thickness. Right ventricular systolic function is low normal. There is severely elevated pulmonary artery systolic pressure. The tricuspid regurgitant velocity is 3.75 m/s, and with an assumed right atrial pressure of 8 mmHg, the estimated right ventricular systolic pressure is 77.8 mmHg. Left Atrium: Left atrial size was mildly dilated. Right Atrium: Right atrial size was normal in size. Pericardium: There is no evidence of pericardial effusion. Mitral Valve: The mitral valve is grossly normal. Trivial mitral valve regurgitation. Tricuspid Valve: The tricuspid valve is grossly normal. Tricuspid valve regurgitation is moderate to severe. Aortic Valve: The aortic valve is tricuspid. Aortic valve regurgitation is not visualized. Pulmonic Valve: The pulmonic valve was grossly normal. Pulmonic valve regurgitation is not visualized. Aorta: The aortic root and ascending aorta are structurally normal, with no evidence of dilitation. Venous: The inferior vena cava is dilated in size with greater than 50% respiratory variability, suggesting right atrial pressure of 8 mmHg. IAS/Shunts: The interatrial septum was not assessed.  LEFT VENTRICLE PLAX 2D LVIDd:         3.70 cm   Diastology LVIDs:         2.50 cm   LV e' medial:    6.89 cm/s LV PW:         0.80 cm   LV E/e' medial:  15.5 LV IVS:        0.60 cm   LV e' lateral:   9.89 cm/s LVOT diam:     2.00 cm   LV E/e' lateral: 10.8 LV SV:         45 LV SV Index:   34 LVOT Area:     3.14 cm  RIGHT VENTRICLE RV S prime:      9.16 cm/s TAPSE (M-mode): 1.7 cm LEFT ATRIUM             Index        RIGHT ATRIUM           Index LA diam:        3.00 cm 2.26 cm/m   RA Area:     10.70 cm LA Vol (A2C):   26.2 ml 19.73 ml/m  RA Volume:   24.70 ml  18.60 ml/m LA Vol (A4C):   26.4 ml 19.88 ml/m LA Biplane Vol: 27.9 ml 21.01 ml/m  AORTIC VALVE LVOT Vmax:   71.00 cm/s LVOT Vmean:  41.800 cm/s LVOT VTI:    0.143 m  AORTA Ao Root diam: 2.90 cm Ao Asc diam:  3.10 cm MITRAL VALVE                TRICUSPID VALVE MV Area (PHT): 4.89 cm     TR Peak grad:   56.2 mmHg MV Decel Time: 155 msec     TR Vmax:        375.00 cm/s MV E velocity: 107.00 cm/s MV A velocity: 43.30 cm/s   SHUNTS MV E/A ratio:  2.47         Systemic VTI:  0.14 m                             Systemic Diam: 2.00 cm Vernell Leep MD Electronically signed by Vernell Leep MD Signature Date/Time: 05/11/2021/1:00:37 PM    Final    CT Super D Chest Wo Contrast  Result Date: 04/19/2021 CLINICAL DATA:  Lung nodules EXAM: CT CHEST WITHOUT CONTRAST TECHNIQUE: Multidetector CT imaging of the chest was performed using thin slice collimation for electromagnetic bronchoscopy planning purposes, without intravenous contrast. COMPARISON:  PET-CT, 02/26/2021, CT chest 01/16/2021 FINDINGS: Cardiovascular: Aortic atherosclerosis. Normal heart size. Three-vessel coronary artery calcifications. No pericardial effusion. Mediastinum/Nodes: No enlarged mediastinal, hilar, or axillary lymph nodes. Thyroid gland, trachea, and esophagus demonstrate no significant findings. Lungs/Pleura: Moderate centrilobular emphysema. Diffuse bilateral bronchial wall thickening. Irregular nodule of the anterior left lower lobe is unchanged, measuring 1.3 x 1.0 cm (series 5, image 110). Irregular region of radiation fibrosis in the posterior left upper lobe, involving the fissure and adjacent superior segment left lower lobe is unchanged, measuring 3.0 x 1.8 cm (series 5, image 64). Unchanged irregular nodule of the  peripheral right upper lobe measuring 1.0 x 0.9 cm (series 5, image 73). Unchanged subsolid mixed solid and cystic nodule of the posterior right upper lobe measuring 1.3 x 1.1 cm (series 5, image 59). Slightly diminished subpleural consolidation of the medial posterior right lower lobe, (e.g. Series 5, image 136, compared to prior series  5, image 137) the dominant nodular component however still measuring approximately 1.6 x 0.9 cm (series 5, image 141). Additional scattered areas of subsolid and ground-glass opacity are unchanged, for example in the anterior right upper lobe (series 5, image 54) and in the posterior right upper lobe (series 5, image 37). No pleural effusion or pneumothorax. Upper Abdomen: No acute abnormality. Calcific stigmata of chronic pancreatitis. Musculoskeletal: No chest wall mass or suspicious bone lesions identified. IMPRESSION: 1. Unchanged irregular nodule of the anterior left lower lobe measuring 1.3 x 1.0 cm. This was previously PET avid and has enlarged over time, and remains highly suspicious for primary lung malignancy. 2. Slightly diminished subpleural consolidation of the medial posterior right lower lobe, previously PET avid, although most likely resolving infection or inflammation. Attention on follow-up. 3. 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 Attention on follow-up. 4. Multiple additional irregular, subsolid, and ground-glass nodules scattered throughout the lungs are unchanged and generally nonspecific. 5. Emphysema and diffuse bilateral bronchial wall thickening, 6. Coronary artery disease. Aortic Atherosclerosis (ICD10-I70.0) and Emphysema (ICD10-J43.9). Electronically Signed   By: Delanna Ahmadi M.D.   On: 04/19/2021 10:37   DG C-ARM BRONCHOSCOPY  Result Date: 04/23/2021 C-ARM BRONCHOSCOPY: Fluoroscopy was utilized  by the requesting physician.  No radiographic interpretation.     Subjective: Patient seen examined bedside, resting comfortably.  Feels much improved today.  Spouse present at bedside.  Ready for discharge home.  Will be getting home health and oxygen set up for him.  Discussed with him needs for complete tobacco cessation and to ensure compliance and follow-up with his specialist which have been scheduled.  He has no other questions or concerns at this time.  Denies headache, no dizziness, no chest pain, no shortness of breath more than his typical baseline, no abdominal pain, no weakness, no fatigue, no fever/chills/night sweats, no nausea/vomiting/diarrhea.  No acute events overnight per nurse staff.  Discharge Exam: Vitals:   05/16/21 0432 05/16/21 0746  BP: (!) 132/97   Pulse: 90   Resp: 17   Temp: 97.7 F (36.5 C)   SpO2: 100% 99%   Vitals:   05/15/21 1247 05/15/21 1950 05/16/21 0432 05/16/21 0746  BP: (!) 147/101 (!) 130/100 (!) 132/97   Pulse: 94 97 90   Resp: 18 18 17    Temp:  (!) 97.5 F (36.4 C) 97.7 F (36.5 C)   TempSrc:  Oral Oral   SpO2: 98% 100% 100% 99%  Weight:      Height:        General: Pt is alert, awake, not in acute distress, chronically ill/cachectic in appearance, appears older than stated age Cardiovascular: RRR, S1/S2 +, no rubs, no gallops Respiratory: Slightly decreased breath sounds/coarse breath sounds bilaterally, no crackles, normal respiratory effort without accessory muscle use, on 3 L nasal cannula. Abdominal: Soft, NT, ND, bowel sounds + Extremities: no edema, no cyanosis    The results of significant diagnostics from this hospitalization (including imaging, microbiology, ancillary and laboratory) are listed below for reference.     Microbiology: Recent Results (from the past 240 hour(s))  Blood culture (routine x 2)     Status: None   Collection Time: 05/10/21  8:30 PM   Specimen: BLOOD  Result Value Ref Range Status   Specimen  Description   Final    BLOOD RIGHT ANTECUBITAL Performed at [facility], [place] [address], [place], [place] [address]    Special Requests   Final    BOTTLES DRAWN AEROBIC AND ANAEROBIC Blood Culture results may not be optimal due to an excessive volume of blood received in culture bottles Performed at [place] [address], [place], [place] [address]    Culture   Final    NO GROWTH 5 DAYS Performed at [facility], [place] [address] [place], [place] [address]    Report Status 05/16/2021 FINAL  Final  Blood culture (routine x 2)     Status: None   Collection Time: 05/10/21  8:30 PM   Specimen: BLOOD  Result Value Ref Range Status   Specimen Description   Final    BLOOD LEFT ANTECUBITAL Performed at [place] [address], [place], [place] [address]    Special Requests   Final    BOTTLES DRAWN AEROBIC AND ANAEROBIC Blood Culture results may not be optimal due to an excessive volume of blood received in culture bottles Performed at [place] [address] [place], [place] [address]    Culture   Final    NO GROWTH 5 DAYS Performed at [facility], [place] [address], [place],  [address]    Report Status 05/16/2021 FINAL  Final  Resp Panel by RT-PCR (Flu A&B, Covid)     Status: None   Collection Time: 05/10/21  8:30 PM  Result Value Ref Range Status   SARS Coronavirus 2 by RT PCR NEGATIVE NEGATIVE Final    Comment: (NOTE) SARS-CoV-2 target nucleic acids are NOT DETECTED.  The SARS-CoV-2 RNA is generally detectable in upper respiratory specimens during the acute phase of infection. The lowest concentration of SARS-CoV-2 viral copies this assay can detect is 138 copies/mL. A negative result does not preclude SARS-Cov-2 infection and should not be used as the sole basis for treatment or other patient management decisions. A negative result may occur with  improper specimen  collection/handling, submission of specimen other than nasopharyngeal swab, presence of viral mutation(s) within the areas targeted by this assay, and inadequate number of viral copies(<138 copies/mL). A negative result must be combined with clinical observations, patient history, and epidemiological information. The expected result is Negative.  Fact Sheet for Patients:  EntrepreneurPulse.com.au  Fact Sheet for Healthcare Providers:  IncredibleEmployment.be  This test is no t yet approved or cleared by the [place] FDA and  has been authorized for detection and/or diagnosis of SARS-CoV-2 by FDA under an Emergency Use Authorization (EUA). This EUA will remain  in effect (meaning this test can be used) for the duration of the COVID-19 declaration under Section 564(b)(1) of the Act, 21 U.S.C.section 360bbb-3(b)(1), unless the authorization is terminated  or revoked sooner.       Influenza A by PCR NEGATIVE NEGATIVE Final   Influenza B by PCR NEGATIVE NEGATIVE Final    Comment: (NOTE) The Xpert Xpress SARS-CoV-2/FLU/RSV plus assay is intended as an aid in the diagnosis of influenza from Nasopharyngeal swab specimens and should not be used as a sole basis for treatment. Nasal washings and aspirates are unacceptable for Xpert Xpress SARS-CoV-2/FLU/RSV testing.  Fact Sheet for Patients: EntrepreneurPulse.com.au  Fact Sheet for Healthcare Providers: IncredibleEmployment.be  This test is not yet approved or cleared by the [place] FDA and has been authorized for detection and/or diagnosis of SARS-CoV-2 by FDA under an Emergency Use Authorization (EUA). This EUA will remain in effect (meaning this  test can be used) for the duration of the COVID-19 declaration under Section 564(b)(1) of the Act, 21 U.S.C. section 360bbb-3(b)(1), unless the authorization is terminated or revoked.  Performed at [facility], [place] [address], [place], [place] [address]   MRSA Next Gen by PCR, Nasal     Status: None   Collection Time: 05/11/21  1:53 AM   Specimen: Nasal Mucosa; Nasal Swab  Result Value Ref Range Status   MRSA by PCR Next Gen NOT DETECTED NOT DETECTED Final    Comment: (NOTE) The GeneXpert MRSA Assay (FDA approved for NASAL specimens only), is one component of a comprehensive MRSA colonization surveillance program. It is not intended to diagnose MRSA infection nor to guide or monitor treatment for MRSA infections. Test performance is not FDA approved in patients less than 61 years old. Performed at [facility], [place] [address], [place], [place] [address]   Respiratory (~20 pathogens) panel by PCR     Status: None   Collection Time: 05/11/21 10:49 AM   Specimen: Nasopharyngeal Swab; Respiratory  Result Value Ref Range Status   Adenovirus NOT DETECTED NOT DETECTED Final   Coronavirus 229E NOT DETECTED NOT DETECTED Final    Comment: (NOTE) The Coronavirus on the Respiratory Panel, DOES NOT test for the novel  Coronavirus (2019 nCoV)    Coronavirus HKU1 NOT DETECTED NOT DETECTED Final   Coronavirus NL63 NOT DETECTED NOT DETECTED Final   Coronavirus OC43 NOT DETECTED NOT DETECTED Final   Metapneumovirus NOT DETECTED NOT DETECTED Final   Rhinovirus / Enterovirus NOT DETECTED NOT DETECTED Final   Influenza A NOT DETECTED NOT DETECTED Final   Influenza B NOT DETECTED NOT DETECTED Final   Parainfluenza Virus 1 NOT DETECTED NOT DETECTED Final   Parainfluenza Virus 2 NOT DETECTED NOT DETECTED Final   Parainfluenza Virus 3 NOT DETECTED NOT DETECTED Final   Parainfluenza Virus 4 NOT DETECTED NOT DETECTED Final   Respiratory Syncytial Virus NOT DETECTED NOT DETECTED Final   Bordetella pertussis NOT DETECTED NOT DETECTED Final   Bordetella Parapertussis NOT DETECTED NOT DETECTED Final   Chlamydophila pneumoniae NOT DETECTED NOT DETECTED Final    Mycoplasma pneumoniae NOT DETECTED NOT DETECTED Final    Comment: Performed at [facility], [place]. [address], [place], [place] [address]     Labs: BNP (last 3 results) Recent Labs    05/10/21 2030  BNP 3,818.2*   Basic Metabolic Panel: Recent Labs  Lab 05/11/21 0745 05/12/21 0023 05/13/21 0337 05/15/21 0451 05/16/21 0508  NA 125* 130* 133* 135 133*  K 4.1 3.3* 3.1* 3.5 3.5  CL 90* 91* 93* 87* 87*  CO2 23 28 33* 39* 37*  GLUCOSE 163* 172* 157* 157* 175*  BUN 40* 43* 52* 42* 52*  CREATININE 1.17 0.95 1.01 0.72 0.90  CALCIUM 8.8* 8.5* 9.0 9.5 9.0  MG  --  2.5* 2.2 1.9 1.9  PHOS  --  4.4 1.7*  --   --    Liver Function Tests: Recent Labs  Lab 05/10/21 2030 05/11/21 0745  AST 51* 45*  ALT 38 36  ALKPHOS 101 108  BILITOT 0.5 0.5  PROT 7.8 7.2  ALBUMIN 3.5 3.2*   Recent Labs  Lab 05/10/21 2030  LIPASE 25   Recent Labs  Lab 05/11/21 0745 05/14/21 0453 05/15/21 0451  AMMONIA 76* 13 <10   CBC: Recent Labs  Lab 05/10/21 2030 05/11/21 0745 05/12/21 0023 05/13/21 0337 05/14/21 0453 05/15/21 0451  WBC 5.9  5.9 13.6* 17.2* 14.5* 11.4*  NEUTROABS 4.4  --   --   --   --   --   HGB 11.1* 11.3* 11.0* 11.6* 12.8* 12.1*  HCT 33.2* 34.9* 32.1* 34.0* 37.3* 36.4*  MCV 94.6 99.1 92.8 92.4 93.0 95.0  PLT 279 260 295 362 374 361   Cardiac Enzymes: No results for input(s): CKTOTAL, CKMB, CKMBINDEX, TROPONINI in the last 168 hours. BNP: Invalid input(s): POCBNP CBG: No results for input(s): GLUCAP in the last 168 hours. D-Dimer No results for input(s): DDIMER in the last 72 hours. Hgb A1c No results for input(s): HGBA1C in the last 72 hours. Lipid Profile No results for input(s): CHOL, HDL, LDLCALC, TRIG, CHOLHDL, LDLDIRECT in the last 72 hours. Thyroid function studies No results for input(s): TSH, T4TOTAL, T3FREE, THYROIDAB in the last 72 hours.  Invalid input(s): FREET3 Anemia work up No results for input(s): VITAMINB12, FOLATE, FERRITIN, TIBC, IRON,  RETICCTPCT in the last 72 hours. Urinalysis    Component Value Date/Time   COLORURINE YELLOW 04/08/2016 [place] 04/08/2016 0454   LABSPEC 1.013 04/08/2016 0454   PHURINE 7.5 04/08/2016 0454   GLUCOSEU 100 (A) 04/08/2016 0454   HGBUR NEGATIVE 04/08/2016 0454   BILIRUBINUR NEGATIVE 04/08/2016 0454   KETONESUR NEGATIVE 04/08/2016 0454   PROTEINUR >300 (A) 04/08/2016 0454   NITRITE NEGATIVE 04/08/2016 0454   LEUKOCYTESUR NEGATIVE 04/08/2016 0454   Sepsis Labs Invalid input(s): PROCALCITONIN,  WBC,  LACTICIDVEN Microbiology Recent Results (from the past 240 hour(s))  Blood culture (routine x 2)     Status: None   Collection Time: 05/10/21  8:30 PM   Specimen: BLOOD  Result Value Ref Range Status   Specimen Description   Final    BLOOD RIGHT ANTECUBITAL Performed at [facility], [place] [address] [place], [place] [address]    Special Requests   Final    BOTTLES DRAWN AEROBIC AND ANAEROBIC Blood Culture results may not be optimal due to an excessive volume of blood received in culture bottles Performed at [place] [address] [place], [place] [address]    Culture   Final    NO GROWTH 5 DAYS Performed at [facility], [place] [address] [place], [place] [address]    Report Status 05/16/2021 FINAL  Final  Blood culture (routine x 2)     Status: None   Collection Time: 05/10/21  8:30 PM   Specimen: BLOOD  Result Value Ref Range Status   Specimen Description   Final    BLOOD LEFT ANTECUBITAL Performed at [place] [address], [place], [place] [address]    Special Requests   Final    BOTTLES DRAWN AEROBIC AND ANAEROBIC Blood Culture results may not be optimal due to an excessive volume of blood received in culture bottles Performed at [place] [address] [place], [place] [address]    Culture   Final    NO GROWTH 5 DAYS Performed at [facility],  [place] [address] [place], [place] [address]    Report Status 05/16/2021 FINAL  Final  Resp Panel by RT-PCR (Flu A&B, Covid)     Status: None   Collection Time: 05/10/21  8:30 PM  Result Value Ref Range Status   SARS Coronavirus 2 by RT PCR NEGATIVE NEGATIVE Final    Comment: (NOTE) SARS-CoV-2 target nucleic acids are NOT DETECTED.  The SARS-CoV-2 RNA is generally detectable in upper respiratory  specimens during the acute phase of infection. The lowest concentration of SARS-CoV-2 viral copies this assay can detect is 138 copies/mL. A negative result does not preclude SARS-Cov-2 infection and should not be used as the sole basis for treatment or other patient management decisions. A negative result may occur with  improper specimen collection/handling, submission of specimen other than nasopharyngeal swab, presence of viral mutation(s) within the areas targeted by this assay, and inadequate number of viral copies(<138 copies/mL). A negative result must be combined with clinical observations, patient history, and epidemiological information. The expected result is Negative.  Fact Sheet for Patients:  EntrepreneurPulse.com.au  Fact Sheet for Healthcare Providers:  IncredibleEmployment.be  This test is no t yet approved or cleared by the [place] FDA and  has been authorized for detection and/or diagnosis of SARS-CoV-2 by FDA under an Emergency Use Authorization (EUA). This EUA will remain  in effect (meaning this test can be used) for the duration of the COVID-19 declaration under Section 564(b)(1) of the Act, 21 U.S.C.section 360bbb-3(b)(1), unless the authorization is terminated  or revoked sooner.       Influenza A by PCR NEGATIVE NEGATIVE Final   Influenza B by PCR NEGATIVE NEGATIVE Final    Comment: (NOTE) The Xpert Xpress SARS-CoV-2/FLU/RSV plus assay is intended as an aid in the diagnosis of influenza from Nasopharyngeal swab specimens  and should not be used as a sole basis for treatment. Nasal washings and aspirates are unacceptable for Xpert Xpress SARS-CoV-2/FLU/RSV testing.  Fact Sheet for Patients: EntrepreneurPulse.com.au  Fact Sheet for Healthcare Providers: IncredibleEmployment.be  This test is not yet approved or cleared by the [place] FDA and has been authorized for detection and/or diagnosis of SARS-CoV-2 by FDA under an Emergency Use Authorization (EUA). This EUA will remain in effect (meaning this test can be used) for the duration of the COVID-19 declaration under Section 564(b)(1) of the Act, 21 U.S.C. section 360bbb-3(b)(1), unless the authorization is terminated or revoked.  Performed at [facility], [place] [address] [place], [place] [address]   MRSA Next Gen by PCR, Nasal     Status: None   Collection Time: 05/11/21  1:53 AM   Specimen: Nasal Mucosa; Nasal Swab  Result Value Ref Range Status   MRSA by PCR Next Gen NOT DETECTED NOT DETECTED Final    Comment: (NOTE) The GeneXpert MRSA Assay (FDA approved for NASAL specimens only), is one component of a comprehensive MRSA colonization surveillance program. It is not intended to diagnose MRSA infection nor to guide or monitor treatment for MRSA infections. Test performance is not FDA approved in patients less than 10 years old. Performed at [facility], [place] [address] [place], [place] [address]   Respiratory (~20 pathogens) panel by PCR     Status: None   Collection Time: 05/11/21 10:49 AM   Specimen: Nasopharyngeal Swab; Respiratory  Result Value Ref Range Status   Adenovirus NOT DETECTED NOT DETECTED Final   Coronavirus 229E NOT DETECTED NOT DETECTED Final    Comment: (NOTE) The Coronavirus on the Respiratory Panel, DOES NOT test for the novel  Coronavirus (2019 nCoV)    Coronavirus HKU1 NOT DETECTED NOT DETECTED Final   Coronavirus NL63 NOT DETECTED NOT  DETECTED Final   Coronavirus OC43 NOT DETECTED NOT DETECTED Final   Metapneumovirus NOT DETECTED NOT DETECTED Final   Rhinovirus / Enterovirus NOT DETECTED NOT DETECTED Final   Influenza A NOT DETECTED NOT DETECTED Final   Influenza B NOT DETECTED NOT  DETECTED Final   Parainfluenza Virus 1 NOT DETECTED NOT DETECTED Final   Parainfluenza Virus 2 NOT DETECTED NOT DETECTED Final   Parainfluenza Virus 3 NOT DETECTED NOT DETECTED Final   Parainfluenza Virus 4 NOT DETECTED NOT DETECTED Final   Respiratory Syncytial Virus NOT DETECTED NOT DETECTED Final   Bordetella pertussis NOT DETECTED NOT DETECTED Final   Bordetella Parapertussis NOT DETECTED NOT DETECTED Final   Chlamydophila pneumoniae NOT DETECTED NOT DETECTED Final   Mycoplasma pneumoniae NOT DETECTED NOT DETECTED Final    Comment: Performed at [facility], [place] [address], [place], [place] [address]     Time coordinating discharge: Over 30 minutes  SIGNED:   Rosellen Lichtenberger J [place] DO  [facility] 05/16/2021, 12:02 PM

## 2021-05-17 ENCOUNTER — Telehealth: Payer: Self-pay | Admitting: Pulmonary Disease

## 2021-05-17 DIAGNOSIS — K4030 Unilateral inguinal hernia, with obstruction, without gangrene, not specified as recurrent: Secondary | ICD-10-CM | POA: Diagnosis not present

## 2021-05-17 DIAGNOSIS — J449 Chronic obstructive pulmonary disease, unspecified: Secondary | ICD-10-CM | POA: Diagnosis not present

## 2021-05-17 DIAGNOSIS — J9601 Acute respiratory failure with hypoxia: Secondary | ICD-10-CM | POA: Diagnosis not present

## 2021-05-17 DIAGNOSIS — G9341 Metabolic encephalopathy: Secondary | ICD-10-CM | POA: Diagnosis not present

## 2021-05-17 NOTE — Telephone Encounter (Signed)
Stacy from [facility] wanted to inform Dr. Valeta Harms they were requested to follow the patient at home after being discharged from the hospital.

## 2021-05-18 DIAGNOSIS — I771 Stricture of artery: Secondary | ICD-10-CM | POA: Diagnosis not present

## 2021-05-18 DIAGNOSIS — Z7982 Long term (current) use of aspirin: Secondary | ICD-10-CM | POA: Diagnosis not present

## 2021-05-18 DIAGNOSIS — I700 Atherosclerosis of aorta: Secondary | ICD-10-CM | POA: Diagnosis not present

## 2021-05-18 DIAGNOSIS — I5033 Acute on chronic diastolic (congestive) heart failure: Secondary | ICD-10-CM | POA: Diagnosis not present

## 2021-05-18 DIAGNOSIS — I2510 Atherosclerotic heart disease of native coronary artery without angina pectoris: Secondary | ICD-10-CM | POA: Diagnosis not present

## 2021-05-18 DIAGNOSIS — F17210 Nicotine dependence, cigarettes, uncomplicated: Secondary | ICD-10-CM | POA: Diagnosis not present

## 2021-05-18 DIAGNOSIS — J9622 Acute and chronic respiratory failure with hypercapnia: Secondary | ICD-10-CM | POA: Diagnosis not present

## 2021-05-18 DIAGNOSIS — Z9181 History of falling: Secondary | ICD-10-CM | POA: Diagnosis not present

## 2021-05-18 DIAGNOSIS — I2699 Other pulmonary embolism without acute cor pulmonale: Secondary | ICD-10-CM | POA: Diagnosis not present

## 2021-05-18 DIAGNOSIS — I272 Pulmonary hypertension, unspecified: Secondary | ICD-10-CM | POA: Diagnosis not present

## 2021-05-18 DIAGNOSIS — Z7951 Long term (current) use of inhaled steroids: Secondary | ICD-10-CM | POA: Diagnosis not present

## 2021-05-18 DIAGNOSIS — I081 Rheumatic disorders of both mitral and tricuspid valves: Secondary | ICD-10-CM | POA: Diagnosis not present

## 2021-05-18 DIAGNOSIS — F32A Depression, unspecified: Secondary | ICD-10-CM | POA: Diagnosis not present

## 2021-05-18 DIAGNOSIS — K559 Vascular disorder of intestine, unspecified: Secondary | ICD-10-CM | POA: Diagnosis not present

## 2021-05-18 DIAGNOSIS — C3412 Malignant neoplasm of upper lobe, left bronchus or lung: Secondary | ICD-10-CM | POA: Diagnosis not present

## 2021-05-18 DIAGNOSIS — C3431 Malignant neoplasm of lower lobe, right bronchus or lung: Secondary | ICD-10-CM | POA: Diagnosis not present

## 2021-05-18 DIAGNOSIS — E43 Unspecified severe protein-calorie malnutrition: Secondary | ICD-10-CM | POA: Diagnosis not present

## 2021-05-18 DIAGNOSIS — R64 Cachexia: Secondary | ICD-10-CM | POA: Diagnosis not present

## 2021-05-18 DIAGNOSIS — I110 Hypertensive heart disease with heart failure: Secondary | ICD-10-CM | POA: Diagnosis not present

## 2021-05-18 DIAGNOSIS — J439 Emphysema, unspecified: Secondary | ICD-10-CM | POA: Diagnosis not present

## 2021-05-18 DIAGNOSIS — J9621 Acute and chronic respiratory failure with hypoxia: Secondary | ICD-10-CM | POA: Diagnosis not present

## 2021-05-18 DIAGNOSIS — R627 Adult failure to thrive: Secondary | ICD-10-CM | POA: Diagnosis not present

## 2021-05-18 DIAGNOSIS — I70229 Atherosclerosis of native arteries of extremities with rest pain, unspecified extremity: Secondary | ICD-10-CM | POA: Diagnosis not present

## 2021-05-18 DIAGNOSIS — E871 Hypo-osmolality and hyponatremia: Secondary | ICD-10-CM | POA: Diagnosis not present

## 2021-05-20 ENCOUNTER — Ambulatory Visit
Admission: RE | Admit: 2021-05-20 | Discharge: 2021-05-20 | Disposition: A | Discharge status: Home / Self Care | Payer: Medicare Other | Source: Ambulatory Visit | Attending: Radiation Oncology | Admitting: Radiation Oncology

## 2021-05-20 ENCOUNTER — Other Ambulatory Visit: Payer: Self-pay

## 2021-05-20 DIAGNOSIS — I5033 Acute on chronic diastolic (congestive) heart failure: Secondary | ICD-10-CM | POA: Diagnosis not present

## 2021-05-20 DIAGNOSIS — R64 Cachexia: Secondary | ICD-10-CM | POA: Diagnosis not present

## 2021-05-20 DIAGNOSIS — C3431 Malignant neoplasm of lower lobe, right bronchus or lung: Secondary | ICD-10-CM | POA: Diagnosis not present

## 2021-05-20 DIAGNOSIS — I081 Rheumatic disorders of both mitral and tricuspid valves: Secondary | ICD-10-CM | POA: Diagnosis not present

## 2021-05-20 DIAGNOSIS — Z9181 History of falling: Secondary | ICD-10-CM | POA: Diagnosis not present

## 2021-05-20 DIAGNOSIS — Z7982 Long term (current) use of aspirin: Secondary | ICD-10-CM | POA: Diagnosis not present

## 2021-05-20 DIAGNOSIS — I2699 Other pulmonary embolism without acute cor pulmonale: Secondary | ICD-10-CM | POA: Diagnosis not present

## 2021-05-20 DIAGNOSIS — I70229 Atherosclerosis of native arteries of extremities with rest pain, unspecified extremity: Secondary | ICD-10-CM | POA: Diagnosis not present

## 2021-05-20 DIAGNOSIS — Z510 Encounter for antineoplastic radiation therapy: Secondary | ICD-10-CM | POA: Diagnosis not present

## 2021-05-20 DIAGNOSIS — K559 Vascular disorder of intestine, unspecified: Secondary | ICD-10-CM | POA: Diagnosis not present

## 2021-05-20 DIAGNOSIS — C3412 Malignant neoplasm of upper lobe, left bronchus or lung: Secondary | ICD-10-CM | POA: Diagnosis not present

## 2021-05-20 DIAGNOSIS — J9622 Acute and chronic respiratory failure with hypercapnia: Secondary | ICD-10-CM | POA: Diagnosis not present

## 2021-05-20 DIAGNOSIS — I700 Atherosclerosis of aorta: Secondary | ICD-10-CM | POA: Diagnosis not present

## 2021-05-20 DIAGNOSIS — Z7951 Long term (current) use of inhaled steroids: Secondary | ICD-10-CM | POA: Diagnosis not present

## 2021-05-20 DIAGNOSIS — F17210 Nicotine dependence, cigarettes, uncomplicated: Secondary | ICD-10-CM | POA: Diagnosis not present

## 2021-05-20 DIAGNOSIS — E871 Hypo-osmolality and hyponatremia: Secondary | ICD-10-CM | POA: Diagnosis not present

## 2021-05-20 DIAGNOSIS — I2510 Atherosclerotic heart disease of native coronary artery without angina pectoris: Secondary | ICD-10-CM | POA: Diagnosis not present

## 2021-05-20 DIAGNOSIS — R627 Adult failure to thrive: Secondary | ICD-10-CM | POA: Diagnosis not present

## 2021-05-20 DIAGNOSIS — J9621 Acute and chronic respiratory failure with hypoxia: Secondary | ICD-10-CM | POA: Diagnosis not present

## 2021-05-20 DIAGNOSIS — F32A Depression, unspecified: Secondary | ICD-10-CM | POA: Diagnosis not present

## 2021-05-20 DIAGNOSIS — I110 Hypertensive heart disease with heart failure: Secondary | ICD-10-CM | POA: Diagnosis not present

## 2021-05-20 DIAGNOSIS — I771 Stricture of artery: Secondary | ICD-10-CM | POA: Diagnosis not present

## 2021-05-20 DIAGNOSIS — I272 Pulmonary hypertension, unspecified: Secondary | ICD-10-CM | POA: Diagnosis not present

## 2021-05-20 DIAGNOSIS — J439 Emphysema, unspecified: Secondary | ICD-10-CM | POA: Diagnosis not present

## 2021-05-20 DIAGNOSIS — E43 Unspecified severe protein-calorie malnutrition: Secondary | ICD-10-CM | POA: Diagnosis not present

## 2021-05-21 ENCOUNTER — Ambulatory Visit: Payer: Medicare Other

## 2021-05-21 DIAGNOSIS — C3412 Malignant neoplasm of upper lobe, left bronchus or lung: Secondary | ICD-10-CM | POA: Diagnosis not present

## 2021-05-21 DIAGNOSIS — R627 Adult failure to thrive: Secondary | ICD-10-CM | POA: Diagnosis not present

## 2021-05-21 DIAGNOSIS — J9622 Acute and chronic respiratory failure with hypercapnia: Secondary | ICD-10-CM | POA: Diagnosis not present

## 2021-05-21 DIAGNOSIS — Z7951 Long term (current) use of inhaled steroids: Secondary | ICD-10-CM | POA: Diagnosis not present

## 2021-05-21 DIAGNOSIS — I2510 Atherosclerotic heart disease of native coronary artery without angina pectoris: Secondary | ICD-10-CM | POA: Diagnosis not present

## 2021-05-21 DIAGNOSIS — I700 Atherosclerosis of aorta: Secondary | ICD-10-CM | POA: Diagnosis not present

## 2021-05-21 DIAGNOSIS — I5033 Acute on chronic diastolic (congestive) heart failure: Secondary | ICD-10-CM | POA: Diagnosis not present

## 2021-05-21 DIAGNOSIS — E43 Unspecified severe protein-calorie malnutrition: Secondary | ICD-10-CM | POA: Diagnosis not present

## 2021-05-21 DIAGNOSIS — Z7982 Long term (current) use of aspirin: Secondary | ICD-10-CM | POA: Diagnosis not present

## 2021-05-21 DIAGNOSIS — J9621 Acute and chronic respiratory failure with hypoxia: Secondary | ICD-10-CM | POA: Diagnosis not present

## 2021-05-21 DIAGNOSIS — I2699 Other pulmonary embolism without acute cor pulmonale: Secondary | ICD-10-CM | POA: Diagnosis not present

## 2021-05-21 DIAGNOSIS — C3431 Malignant neoplasm of lower lobe, right bronchus or lung: Secondary | ICD-10-CM | POA: Diagnosis not present

## 2021-05-21 DIAGNOSIS — I771 Stricture of artery: Secondary | ICD-10-CM | POA: Diagnosis not present

## 2021-05-21 DIAGNOSIS — F32A Depression, unspecified: Secondary | ICD-10-CM | POA: Diagnosis not present

## 2021-05-21 DIAGNOSIS — K559 Vascular disorder of intestine, unspecified: Secondary | ICD-10-CM | POA: Diagnosis not present

## 2021-05-21 DIAGNOSIS — J439 Emphysema, unspecified: Secondary | ICD-10-CM | POA: Diagnosis not present

## 2021-05-21 DIAGNOSIS — I70229 Atherosclerosis of native arteries of extremities with rest pain, unspecified extremity: Secondary | ICD-10-CM | POA: Diagnosis not present

## 2021-05-21 DIAGNOSIS — R64 Cachexia: Secondary | ICD-10-CM | POA: Diagnosis not present

## 2021-05-21 DIAGNOSIS — I110 Hypertensive heart disease with heart failure: Secondary | ICD-10-CM | POA: Diagnosis not present

## 2021-05-21 DIAGNOSIS — Z9181 History of falling: Secondary | ICD-10-CM | POA: Diagnosis not present

## 2021-05-21 DIAGNOSIS — F17210 Nicotine dependence, cigarettes, uncomplicated: Secondary | ICD-10-CM | POA: Diagnosis not present

## 2021-05-21 DIAGNOSIS — I272 Pulmonary hypertension, unspecified: Secondary | ICD-10-CM | POA: Diagnosis not present

## 2021-05-21 DIAGNOSIS — I081 Rheumatic disorders of both mitral and tricuspid valves: Secondary | ICD-10-CM | POA: Diagnosis not present

## 2021-05-21 DIAGNOSIS — E871 Hypo-osmolality and hyponatremia: Secondary | ICD-10-CM | POA: Diagnosis not present

## 2021-05-22 ENCOUNTER — Ambulatory Visit: Payer: Medicare Other

## 2021-05-22 ENCOUNTER — Telehealth: Payer: Self-pay | Admitting: Adult Health

## 2021-05-22 DIAGNOSIS — J432 Centrilobular emphysema: Secondary | ICD-10-CM

## 2021-05-22 DIAGNOSIS — C3432 Malignant neoplasm of lower lobe, left bronchus or lung: Secondary | ICD-10-CM | POA: Diagnosis not present

## 2021-05-22 DIAGNOSIS — C3490 Malignant neoplasm of unspecified part of unspecified bronchus or lung: Secondary | ICD-10-CM

## 2021-05-22 NOTE — Telephone Encounter (Signed)
Left message for [name] to call back.  

## 2021-05-23 ENCOUNTER — Encounter: Payer: Self-pay | Admitting: Radiation Oncology

## 2021-05-23 ENCOUNTER — Other Ambulatory Visit: Payer: Self-pay

## 2021-05-23 ENCOUNTER — Encounter (HOSPITAL_COMMUNITY): Payer: Self-pay | Admitting: Pulmonary Disease

## 2021-05-23 ENCOUNTER — Ambulatory Visit
Admission: RE | Admit: 2021-05-23 | Discharge: 2021-05-23 | Disposition: A | Discharge status: Home / Self Care | Payer: Medicare Other | Source: Ambulatory Visit | Attending: Radiation Oncology | Admitting: Radiation Oncology

## 2021-05-23 ENCOUNTER — Encounter: Payer: Self-pay | Admitting: Urology

## 2021-05-23 DIAGNOSIS — C3431 Malignant neoplasm of lower lobe, right bronchus or lung: Secondary | ICD-10-CM | POA: Diagnosis not present

## 2021-05-23 DIAGNOSIS — C3411 Malignant neoplasm of upper lobe, right bronchus or lung: Secondary | ICD-10-CM | POA: Diagnosis not present

## 2021-05-23 DIAGNOSIS — Z510 Encounter for antineoplastic radiation therapy: Secondary | ICD-10-CM | POA: Diagnosis not present

## 2021-05-23 DIAGNOSIS — F17210 Nicotine dependence, cigarettes, uncomplicated: Secondary | ICD-10-CM | POA: Diagnosis not present

## 2021-05-23 DIAGNOSIS — C3432 Malignant neoplasm of lower lobe, left bronchus or lung: Secondary | ICD-10-CM

## 2021-05-23 NOTE — Telephone Encounter (Signed)
[name] is returning phone call. Melissa phone number is [phone].

## 2021-05-24 DIAGNOSIS — E43 Unspecified severe protein-calorie malnutrition: Secondary | ICD-10-CM | POA: Diagnosis not present

## 2021-05-24 DIAGNOSIS — K559 Vascular disorder of intestine, unspecified: Secondary | ICD-10-CM | POA: Diagnosis not present

## 2021-05-24 DIAGNOSIS — Z7982 Long term (current) use of aspirin: Secondary | ICD-10-CM | POA: Diagnosis not present

## 2021-05-24 DIAGNOSIS — I110 Hypertensive heart disease with heart failure: Secondary | ICD-10-CM | POA: Diagnosis not present

## 2021-05-24 DIAGNOSIS — I2510 Atherosclerotic heart disease of native coronary artery without angina pectoris: Secondary | ICD-10-CM | POA: Diagnosis not present

## 2021-05-24 DIAGNOSIS — I081 Rheumatic disorders of both mitral and tricuspid valves: Secondary | ICD-10-CM | POA: Diagnosis not present

## 2021-05-24 DIAGNOSIS — I771 Stricture of artery: Secondary | ICD-10-CM | POA: Diagnosis not present

## 2021-05-24 DIAGNOSIS — I272 Pulmonary hypertension, unspecified: Secondary | ICD-10-CM | POA: Diagnosis not present

## 2021-05-24 DIAGNOSIS — I2699 Other pulmonary embolism without acute cor pulmonale: Secondary | ICD-10-CM | POA: Diagnosis not present

## 2021-05-24 DIAGNOSIS — Z9181 History of falling: Secondary | ICD-10-CM | POA: Diagnosis not present

## 2021-05-24 DIAGNOSIS — F32A Depression, unspecified: Secondary | ICD-10-CM | POA: Diagnosis not present

## 2021-05-24 DIAGNOSIS — R627 Adult failure to thrive: Secondary | ICD-10-CM | POA: Diagnosis not present

## 2021-05-24 DIAGNOSIS — I70229 Atherosclerosis of native arteries of extremities with rest pain, unspecified extremity: Secondary | ICD-10-CM | POA: Diagnosis not present

## 2021-05-24 DIAGNOSIS — E871 Hypo-osmolality and hyponatremia: Secondary | ICD-10-CM | POA: Diagnosis not present

## 2021-05-24 DIAGNOSIS — J9622 Acute and chronic respiratory failure with hypercapnia: Secondary | ICD-10-CM | POA: Diagnosis not present

## 2021-05-24 DIAGNOSIS — C3412 Malignant neoplasm of upper lobe, left bronchus or lung: Secondary | ICD-10-CM | POA: Diagnosis not present

## 2021-05-24 DIAGNOSIS — I700 Atherosclerosis of aorta: Secondary | ICD-10-CM | POA: Diagnosis not present

## 2021-05-24 DIAGNOSIS — J439 Emphysema, unspecified: Secondary | ICD-10-CM | POA: Diagnosis not present

## 2021-05-24 DIAGNOSIS — I5033 Acute on chronic diastolic (congestive) heart failure: Secondary | ICD-10-CM | POA: Diagnosis not present

## 2021-05-24 DIAGNOSIS — Z7951 Long term (current) use of inhaled steroids: Secondary | ICD-10-CM | POA: Diagnosis not present

## 2021-05-24 DIAGNOSIS — J9621 Acute and chronic respiratory failure with hypoxia: Secondary | ICD-10-CM | POA: Diagnosis not present

## 2021-05-24 DIAGNOSIS — R64 Cachexia: Secondary | ICD-10-CM | POA: Diagnosis not present

## 2021-05-24 DIAGNOSIS — C3431 Malignant neoplasm of lower lobe, right bronchus or lung: Secondary | ICD-10-CM | POA: Diagnosis not present

## 2021-05-24 DIAGNOSIS — F17210 Nicotine dependence, cigarettes, uncomplicated: Secondary | ICD-10-CM | POA: Diagnosis not present

## 2021-05-24 NOTE — Telephone Encounter (Signed)
Called Melissa and had to [facility].

## 2021-05-27 ENCOUNTER — Telehealth: Payer: Self-pay | Admitting: Pulmonary Disease

## 2021-05-27 DIAGNOSIS — I081 Rheumatic disorders of both mitral and tricuspid valves: Secondary | ICD-10-CM | POA: Diagnosis not present

## 2021-05-27 DIAGNOSIS — E43 Unspecified severe protein-calorie malnutrition: Secondary | ICD-10-CM | POA: Diagnosis not present

## 2021-05-27 DIAGNOSIS — E871 Hypo-osmolality and hyponatremia: Secondary | ICD-10-CM | POA: Diagnosis not present

## 2021-05-27 DIAGNOSIS — I2699 Other pulmonary embolism without acute cor pulmonale: Secondary | ICD-10-CM | POA: Diagnosis not present

## 2021-05-27 DIAGNOSIS — I110 Hypertensive heart disease with heart failure: Secondary | ICD-10-CM | POA: Diagnosis not present

## 2021-05-27 DIAGNOSIS — K559 Vascular disorder of intestine, unspecified: Secondary | ICD-10-CM | POA: Diagnosis not present

## 2021-05-27 DIAGNOSIS — Z7982 Long term (current) use of aspirin: Secondary | ICD-10-CM | POA: Diagnosis not present

## 2021-05-27 DIAGNOSIS — J9622 Acute and chronic respiratory failure with hypercapnia: Secondary | ICD-10-CM | POA: Diagnosis not present

## 2021-05-27 DIAGNOSIS — I272 Pulmonary hypertension, unspecified: Secondary | ICD-10-CM | POA: Diagnosis not present

## 2021-05-27 DIAGNOSIS — I2510 Atherosclerotic heart disease of native coronary artery without angina pectoris: Secondary | ICD-10-CM | POA: Diagnosis not present

## 2021-05-27 DIAGNOSIS — I771 Stricture of artery: Secondary | ICD-10-CM | POA: Diagnosis not present

## 2021-05-27 DIAGNOSIS — C3431 Malignant neoplasm of lower lobe, right bronchus or lung: Secondary | ICD-10-CM | POA: Diagnosis not present

## 2021-05-27 DIAGNOSIS — J439 Emphysema, unspecified: Secondary | ICD-10-CM | POA: Diagnosis not present

## 2021-05-27 DIAGNOSIS — I5033 Acute on chronic diastolic (congestive) heart failure: Secondary | ICD-10-CM | POA: Diagnosis not present

## 2021-05-27 DIAGNOSIS — F17210 Nicotine dependence, cigarettes, uncomplicated: Secondary | ICD-10-CM | POA: Diagnosis not present

## 2021-05-27 DIAGNOSIS — F32A Depression, unspecified: Secondary | ICD-10-CM | POA: Diagnosis not present

## 2021-05-27 DIAGNOSIS — I700 Atherosclerosis of aorta: Secondary | ICD-10-CM | POA: Diagnosis not present

## 2021-05-27 DIAGNOSIS — Z7951 Long term (current) use of inhaled steroids: Secondary | ICD-10-CM | POA: Diagnosis not present

## 2021-05-27 DIAGNOSIS — I70229 Atherosclerosis of native arteries of extremities with rest pain, unspecified extremity: Secondary | ICD-10-CM | POA: Diagnosis not present

## 2021-05-27 DIAGNOSIS — Z9181 History of falling: Secondary | ICD-10-CM | POA: Diagnosis not present

## 2021-05-27 DIAGNOSIS — R64 Cachexia: Secondary | ICD-10-CM | POA: Diagnosis not present

## 2021-05-27 DIAGNOSIS — R627 Adult failure to thrive: Secondary | ICD-10-CM | POA: Diagnosis not present

## 2021-05-27 DIAGNOSIS — C3412 Malignant neoplasm of upper lobe, left bronchus or lung: Secondary | ICD-10-CM | POA: Diagnosis not present

## 2021-05-27 DIAGNOSIS — J9621 Acute and chronic respiratory failure with hypoxia: Secondary | ICD-10-CM | POA: Diagnosis not present

## 2021-05-28 ENCOUNTER — Ambulatory Visit (INDEPENDENT_AMBULATORY_CARE_PROVIDER_SITE_OTHER): Payer: Medicare Other | Admitting: Adult Health

## 2021-05-28 ENCOUNTER — Telehealth: Payer: Self-pay | Admitting: *Deleted

## 2021-05-28 ENCOUNTER — Other Ambulatory Visit: Payer: Self-pay

## 2021-05-28 ENCOUNTER — Encounter: Payer: Self-pay | Admitting: Adult Health

## 2021-05-28 VITALS — BP 126/70 | HR 82 | Temp 98.5°F | Ht 61.0 in | Wt 83.6 lb

## 2021-05-28 DIAGNOSIS — J9622 Acute and chronic respiratory failure with hypercapnia: Secondary | ICD-10-CM | POA: Diagnosis not present

## 2021-05-28 DIAGNOSIS — J9602 Acute respiratory failure with hypercapnia: Secondary | ICD-10-CM

## 2021-05-28 DIAGNOSIS — C3490 Malignant neoplasm of unspecified part of unspecified bronchus or lung: Secondary | ICD-10-CM | POA: Diagnosis not present

## 2021-05-28 DIAGNOSIS — F17210 Nicotine dependence, cigarettes, uncomplicated: Secondary | ICD-10-CM

## 2021-05-28 DIAGNOSIS — J441 Chronic obstructive pulmonary disease with (acute) exacerbation: Secondary | ICD-10-CM

## 2021-05-28 DIAGNOSIS — J439 Emphysema, unspecified: Secondary | ICD-10-CM | POA: Diagnosis not present

## 2021-05-28 DIAGNOSIS — J9621 Acute and chronic respiratory failure with hypoxia: Secondary | ICD-10-CM | POA: Diagnosis not present

## 2021-05-28 DIAGNOSIS — E43 Unspecified severe protein-calorie malnutrition: Secondary | ICD-10-CM

## 2021-05-28 DIAGNOSIS — J9601 Acute respiratory failure with hypoxia: Secondary | ICD-10-CM

## 2021-05-28 DIAGNOSIS — I2782 Chronic pulmonary embolism: Secondary | ICD-10-CM

## 2021-05-28 DIAGNOSIS — C3431 Malignant neoplasm of lower lobe, right bronchus or lung: Secondary | ICD-10-CM | POA: Diagnosis not present

## 2021-05-28 LAB — FUNGAL ORGANISM REFLEX

## 2021-05-28 LAB — FUNGUS CULTURE WITH STAIN

## 2021-05-28 LAB — FUNGUS CULTURE RESULT

## 2021-05-28 MED ORDER — BREZTRI AEROSPHERE 160-9-4.8 MCG/ACT IN AERO: 2.0000 | INHALATION_SPRAY | Freq: Two times a day (BID) | RESPIRATORY_TRACT | 0 refills | Status: DC

## 2021-05-28 MED ORDER — RIVAROXABAN 20 MG PO TABS: 20.0000 mg | ORAL_TABLET | Freq: Every day | ORAL | 2 refills | Status: DC

## 2021-05-28 MED ORDER — RIVAROXABAN (XARELTO) VTE STARTER PACK (15 & 20 MG): ORAL_TABLET | ORAL | 0 refills | Status: DC

## 2021-05-28 NOTE — Patient Instructions (Signed)
Continue on BREZTRI 2 puffs Twice daily  rinse after use.  Work on not smoking  Continue on Oxygen 2-3l/m   , goal is to have O2 sats >88-90%.  Activity as tolerated  Boost between meals Twice daily   Begin Eliquis as directed. - will contact pharmacy  Begin BIPAP At bedtime  and naps.  Follow up with Dr. Lamonte Sakai  in 3-4 weeks and As needed   Please contact office for sooner follow up if symptoms do not improve or worsen or seek emergency care

## 2021-05-28 NOTE — Telephone Encounter (Signed)
ATC patient x1, left detailed vm per DPR regarding prescriptions for Xarelto to his pharmacy, a starter pack to be taken first and then a 2nd script to be taken after starter pack completed in January.  Advised to call the office with any questions.

## 2021-05-28 NOTE — Progress Notes (Signed)
@Patient  ID: [name], male    DOB: [birth year], 70 y.o.   MRN: [id number]  Chief Complaint  Patient presents with   Hospitalization Follow-up    Referring provider: Janie Morning, DO  HPI: 70 year old male smoker followed for COPD, Lung Cancer -history of Metachronous malignancies with non-small cell lung cancer in the right lung and left lung status post SBRT 2016 and 2019 and recurrent lung cancer April 23, 2021, protein calorie malnutrition  TEST/EVENTS :  01/16/2021 CT chest: Left upper lobe subpleural lesion concerning for malignancy, a slowly enlarging opacity within the left upper lobe along the major fissure line as well as right lower lobe subpleural lesion difficult to exclude malignancy.    2016 non-small cell lung cancer, left 2019: Right upper lobe non-small cell lung cancer  Cytology 04/23/2021 FINAL MICROSCOPIC DIAGNOSIS:   C. LUNG, RUL, BRUSHING:  - No malignant cells identified    D. LUNG, RUL, FINE NEEDLE ASPIRATION:  - Malignant cells consistent with adenocarcinoma  - See comment     A. LUNG, LLL, FINE NEEDLE ASPIRATION:  - Malignant cells consistent with adenocarcinoma    05/28/2021 Follow up ; COPD, oxygen dependent respiratory failure, recurrent lung cancer Patient returns for a post hospital follow-up.  As above patient has advanced end-stage COPD.  Recurrent non-small cell lung cancer with recent bronchoscopy showing adenocarcinoma.  He has multiple comorbidities including pulmonary hypertension chronic diastolic heart failure and severe protein calorie malnutrition.  He was recently admitted for acute on chronic hypoxic and hypercarbic respiratory failure.  CT chest showed a chronic appearing PE in the right lower lobe pulmonary artery.  Patient was started on IV heparin and transition to Eliquis.  Hospital stay was complicated by encephalopathy with progressive confusion.  This was felt to be multifactorial in the setting mild hypoxia, heart  failure.  He did require initial Precedex. He required initial BiPAP support.  He was recommended for a noninvasive ventilator at discharge.  Unfortunately insurance would not cover.  Also patient was unable to get Eliquis as insurance would not cover.  We have contacted homecare company and they report that his insurance will cover a BiPAP ST instead of a trilogy device.. Patient has recently been diagnosed with recurrent lung cancer.  He has outpatient follow-up with radiation oncology.  Completed 3 SBRT sessions . He has received SBRT in the past x2. Since discharge patient is feeling better, decreased cough and congestion . Now on O2 3l/m .  He remains on Breztri inhaler twice daily PT and OT at home .  Has family member with him , lives with her, Donita . She is his caregiver.  Home alone during daytime.   Still smoking , dicussed smoking cessation.      Allergies  Allergen Reactions   Prevacid [Lansoprazole] Other (See Comments)    Bloating ("became swollen with gas")   Varenicline Nausea And Vomiting and Other (See Comments)    Vivid, bad dreams also   Ciprofloxacin Other (See Comments)    GI Upset   Mirtazapine Other (See Comments)    Negatively affected sleep   Omeprazole Other (See Comments)    "Gas"    Immunization History  Administered Date(s) Administered   Fluad Quad(high Dose 65+) 03/20/2021   Influenza, High Dose Seasonal PF 04/29/2018   PFIZER(Purple Top)SARS-COV-2 Vaccination 04/27/2020, 10/29/2020, 04/09/2021    Past Medical History:  Diagnosis Date   Allergies    being around corn & grain causes itchy eyes, nasal congestion. He can eat  these with no problems.   Anxiety    COPD (chronic obstructive pulmonary disease) (HCC)    Hypertension    Iliac artery stenosis, right (HCC)    80% Stenosis   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[place])    Lower limb ischemia 03/26/2018   Lung cancer    LUL adenocarcinoma 12/28/14, s/p SBRT; poorly differentiated NSCLC RUL 11/23/17,  s/p SBRT   Pancreatitis    x2 stents placed to make patent duct to pancreas   Pulmonary hypertension (HCC)    PASP 48 mmHg 03/26/18 (echo, [facility])   Severe claudication ([place])    Status post left foot surgery    Tobacco abuse     Tobacco History: Social History   Tobacco Use  Smoking Status Every Day   Packs/day: 1.00   Years: 50.00   Pack years: 50.00   Types: Cigarettes  Smokeless Tobacco Never  Tobacco Comments   smoking 0.5 ppd as of 10/07/2018   Smoking 2-3 cigarettes per day 05/28/2021   Ready to quit: Not Answered Counseling given: Not Answered Tobacco comments: smoking 0.5 ppd as of 10/07/2018 Smoking 2-3 cigarettes per day 05/28/2021   Outpatient Medications Prior to Visit  Medication Sig Dispense Refill   albuterol (PROVENTIL) (2.5 MG/3ML) 0.083% nebulizer solution Take 3 mLs (2.5 mg total) by nebulization every 2 (two) hours as needed for wheezing or shortness of breath. 75 mL 1   albuterol (VENTOLIN HFA) 108 (90 Base) MCG/ACT inhaler Inhale 2 puffs into the lungs every 6 (six) hours as needed for shortness of breath. 1 each 3   aspirin 325 MG tablet Take 325 mg by mouth daily.     Budeson-Glycopyrrol-Formoterol (BREZTRI AEROSPHERE) 160-9-4.8 MCG/ACT AERO Inhale 2 puffs into the lungs 2 (two) times daily. 5.9 g 3   buPROPion (WELLBUTRIN XL) 150 MG 24 hr tablet Take 150 mg by mouth every morning.      CALCIUM PO Take 1 tablet by mouth daily.     Cholecalciferol (VITAMIN D-3 PO) Take 1 capsule by mouth daily.     fluticasone (FLONASE) 50 MCG/ACT nasal spray Place 2 sprays into both nostrils daily as needed for allergies.     furosemide (LASIX) 40 MG tablet Take 1 tablet (40 mg total) by mouth daily. 30 tablet 2   ipratropium (ATROVENT) 0.02 % nebulizer solution Take 0.25-0.5 mg by nebulization every 6 (six) hours as needed for wheezing or shortness of breath.     lactose free nutrition (BOOST) LIQD Take 237 mLs by mouth 3 (three) times daily between  meals.     metoprolol succinate (TOPROL-XL) 25 MG 24 hr tablet Take 1 tablet (25 mg total) by mouth daily. 30 tablet 2   Multiple Vitamin (MULTIVITAMIN WITH MINERALS) TABS tablet Take 1 tablet by mouth every morning.     Nutritional Supplements (CARNATION BREAKFAST ESSENTIALS PO) Take 1 packet by mouth daily.     potassium chloride (KLOR-CON M) 10 MEQ tablet Take 1 tablet (10 mEq total) by mouth daily. 30 tablet 2   Pseudoeph-Bromphen-DM (BROMPHENIRAMINE-DM-PSE PO) Take 5 mLs by mouth 3 (three) times daily as needed (for coughing).     ALEVE 220 MG tablet Take 220-440 mg by mouth 2 (two) times daily as needed (for mild pain or headaches). (Patient not taking: Reported on 05/28/2021)     apixaban (ELIQUIS) 5 MG TABS tablet Take 2 tablets (10mg ) twice daily for 7 days, then 1 tablet (5mg ) twice daily (Patient not taking: Reported on 05/28/2021) 60 tablet 0   Ascorbic  Acid (VITAMIN C PO) Take 1 tablet by mouth daily. (Patient not taking: Reported on 05/28/2021)     ibuprofen (ADVIL) 200 MG tablet Take 200-400 mg by mouth every 8 (eight) hours as needed for mild pain or headache. (Patient not taking: Reported on 05/28/2021)     Multiple Vitamins-Minerals (ZINC PO) Take 1 tablet by mouth daily. (Patient not taking: Reported on 05/28/2021)     predniSONE (DELTASONE) 10 MG tablet Take 3 tablets (30 mg total) by mouth daily for 3 days, THEN 2 tablets (20 mg total) daily for 4 days, THEN 1 tablet (10 mg total) daily for 4 days. (Patient not taking: Reported on 05/28/2021) 21 tablet 0   No facility-administered medications prior to visit.     Review of Systems:   Constitutional:   No  weight loss, night sweats,  Fevers, chills,  +fatigue, or  lassitude.  HEENT:   No headaches,  Difficulty swallowing,  Tooth/dental problems, or  Sore throat,                No sneezing, itching, ear ache, nasal congestion, post nasal drip,   CV:  No chest pain,  Orthopnea, PND, swelling in lower extremities, anasarca,  dizziness, palpitations, syncope.   GI  No heartburn, indigestion, abdominal pain, nausea, vomiting, diarrhea, change in bowel habits, loss of appetite, bloody stools.   Resp:   No chest wall deformity  Skin: no rash or lesions.  GU: no dysuria, change in color of urine, no urgency or frequency.  No flank pain, no hematuria   MS:  No joint pain or swelling.  No decreased range of motion.  No back pain.    Physical Exam  BP 126/70 (BP Location: Right Arm, Patient Position: Sitting, Cuff Size: Normal)    Pulse 82    Temp 98.5 F (36.9 C) (Oral)    Ht 5\' 1"  (1.549 m)    Wt 83 lb 9.6 oz (37.9 kg)    SpO2 99%    BMI 15.80 kg/m   GEN: A/Ox3; pleasant , NAD, frail and elderly , on O2    HEENT:  [place]/AT,   NOSE-clear, THROAT-clear, no lesions, no postnasal drip or exudate noted.   NECK:  Supple w/ fair ROM; no JVD; normal carotid impulses w/o bruits; no thyromegaly or nodules palpated; no lymphadenopathy.    RESP  decreased bs in bases  no accessory muscle use, no dullness to percussion  CARD:  RRR, no m/r/g, no peripheral edema, pulses intact, no cyanosis or clubbing.  GI:   Soft & nt; nml bowel sounds; no organomegaly or masses detected.   Musco: Warm bil, no deformities or joint swelling noted.   Neuro: alert, no focal deficits noted.    Skin: Warm, no lesions or rashes    Lab Results:  CBC     BNP   ProBNP No results found for: PROBNP  Imaging: DG Chest 1 View  Result Date: 05/12/2021 CLINICAL DATA:  Hypoxia EXAM: CHEST  1 VIEW COMPARISON:  05/10/2021 FINDINGS: Cardiac shadow is within normal limits. Lungs are well aerated bilaterally. Increasing airspace opacity in the left upper lobe is noted consistent with acute on chronic infiltrate. No sizable effusion is seen. IMPRESSION: Acute on chronic infiltrate in the left upper lobe. Some degree of volume loss is noted. Electronically Signed   By: Inez Catalina M.D.   On: 05/12/2021 20:39   DG Chest 2 View  Result  Date: 05/10/2021 CLINICAL DATA:  Shortness of breath EXAM: CHEST -  2 VIEW COMPARISON:  04/23/2021 FINDINGS: Cardiac shadow is within normal limits. Aortic calcifications are noted. Mild vascular congestion is noted with minimal edema. Lungs are hyperinflated. Stable fiducial marker is noted in the left lower lung. No focal infiltrate is seen. IMPRESSION: Mild interstitial edema. Electronically Signed   By: Inez Catalina M.D.   On: 05/10/2021 21:35   CT Angio Chest PE W and/or Wo Contrast  Result Date: 05/10/2021 CLINICAL DATA:  Known history of lung carcinoma with productive cough EXAM: CT ANGIOGRAPHY CHEST WITH CONTRAST TECHNIQUE: Multidetector CT imaging of the chest was performed using the standard protocol during bolus administration of intravenous contrast. Multiplanar CT image reconstructions and MIPs were obtained to evaluate the vascular anatomy. CONTRAST:  58mL OMNIPAQUE IOHEXOL 350 MG/ML SOLN COMPARISON:  04/18/2021 FINDINGS: Cardiovascular: Atherosclerotic calcifications of the thoracic aorta are noted without aneurysmal dilatation or dissection. No cardiac enlargement is seen. Coronary calcifications are noted. Pulmonary artery shows a normal branching pattern bilaterally. Tiny filling defect is noted in the medial right lung base best seen on image number 199 of series 5 which appears to represent a more chronic adherent pulmonary embolus. No other filling defect is identified. Mediastinum/Nodes: Thoracic inlet is within normal limits. No sizable hilar or mediastinal adenopathy is noted. Stable small nodes are noted in the AP window. Esophagus is within normal limits. Lungs/Pleura: Significant emphysematous changes are noted similar to that seen on the prior exam. In the right upper lobe along the major fissure best seen on image number 48 of series 6 there is a 9 by 5 mm nodule identified. The overall size is decreased from the prior examination although the degree of solid component has increased  with stable deformity of the major fissure. The nodule in the lateral aspect of the right upper lobe is again seen on image number 67 of series 6 but decreased in size now measuring approximately 8 mm in greatest dimension. Bronchial wall thickening remains in the right lower lobe with evidence of right basilar atelectasis. Calcified granuloma is noted in the right lower lobe as well. The nodular density in the medial posterior right lower lobe is obscured by the atelectatic changes. In the left upper lobe, the area of previously described post radiation changes is again identified with a significant increase in solid component when compared with the prior exam. This measures approximately 2.2 x 1.6 cm in greatest dimension. This increase in solid component may represent some retraction of the post irradiation changes. The previously seen nodule in the left lower lobe now measures 10 x 5 mm slightly decreased in size when compared with the prior exam. Left basilar atelectasis is noted. Upper Abdomen: Visualized upper abdomen is within normal limits. Musculoskeletal: Degenerative changes of the thoracic spine are noted. No acute rib fracture is seen. Review of the MIP images confirms the above findings. IMPRESSION: Single chronic appearing pulmonary embolus in the right lower lobe pulmonary artery. No other acute appearing pulmonary emboli are noted. Changes in the lungs bilaterally consistent with the patient's known history of lung carcinoma. The previously seen left lower lobe nodule anteriorly has decreased slightly in the interval when compare with the prior exam. Increase in solid component within the right upper lobe nodule along the major fissure. This may represent some focal progression of disease. Attention on follow-up exam is recommended. Increase in solid component within the known left upper lobe changes previously described and is post radiation fibrosis. This may also represent some focal local  progression of disease.  Aortic Atherosclerosis (ICD10-I70.0) and Emphysema (ICD10-J43.9). Electronically Signed   By: Inez Catalina M.D.   On: 05/10/2021 22:21   DG CHEST PORT 1 VIEW  Result Date: 05/16/2021 CLINICAL DATA:  Shortness of breath EXAM: PORTABLE CHEST 1 VIEW COMPARISON:  05/12/2021 FINDINGS: Airspace disease again seen in the left upper lobe, unchanged. No confluent opacity on the right. Heart is normal size. No effusions or acute bony abnormality. IMPRESSION: Continued consolidation in the left upper lobe, unchanged. Electronically Signed   By: Rolm Baptise M.D.   On: 05/16/2021 08:21   ECHOCARDIOGRAM COMPLETE  Result Date: 05/11/2021    ECHOCARDIOGRAM REPORT   Patient Name:   TARRY FOUNTAIN Date of Exam: 05/11/2021 Medical Rec #:  [id number]       Height:       62.0 in Accession #:    [id number]      Weight:       84.9 lb Date of Birth:  Sep 18, 1950       BSA:          1.328 m Patient Age:    59 years        BP:           127/59 mmHg Patient Gender: M               HR:           74 bpm. Exam Location:  Inpatient Procedure: 2D Echo, Cardiac Doppler and Color Doppler Indications:     Pulmonary Embolus I26.09  History:         Patient has no prior history of Echocardiogram examinations.                  COPD and Pulmonary HTN; Risk Factors:Hypertension and Current                  Smoker.  Sonographer:     Bernadene Person RDCS Referring Phys:  [id number] Ilene Qua OPYD Diagnosing Phys: Vernell Leep MD  Sonographer Comments: Echo performed with patient supine and on artificial respirator. IMPRESSIONS  1. Left ventricular ejection fraction, by estimation, is 60 to 65%. The left ventricle has normal function. The left ventricle has no regional wall motion abnormalities. Left ventricular diastolic parameters are consistent with Grade III diastolic dysfunction (restrictive).  2. Right ventricular systolic function is low normal. The right ventricular size is mildly enlarged. There is severely elevated  pulmonary artery systolic pressure. Estimated PASP 64 mmHg.  3. Left atrial size was mildly dilated.  4. The mitral valve is grossly normal. Trivial mitral valve regurgitation.  5. Tricuspid valve regurgitation is moderate to severe.  6. The aortic valve is tricuspid. Aortic valve regurgitation is not visualized.  7. The inferior vena cava is dilated in size with >50% respiratory variability, suggesting right atrial pressure of 8 mmHg. FINDINGS  Left Ventricle: Left ventricular ejection fraction, by estimation, is 60 to 65%. The left ventricle has normal function. The left ventricle has no regional wall motion abnormalities. The left ventricular internal cavity size was normal in size. There is  no left ventricular hypertrophy. Left ventricular diastolic parameters are consistent with Grade III diastolic dysfunction (restrictive). Right Ventricle: The right ventricular size is mildly enlarged. No increase in right ventricular wall thickness. Right ventricular systolic function is low normal. There is severely elevated pulmonary artery systolic pressure. The tricuspid regurgitant velocity is 3.75 m/s, and with an assumed right atrial pressure of 8 mmHg, the estimated right ventricular systolic pressure is 64.2  mmHg. Left Atrium: Left atrial size was mildly dilated. Right Atrium: Right atrial size was normal in size. Pericardium: There is no evidence of pericardial effusion. Mitral Valve: The mitral valve is grossly normal. Trivial mitral valve regurgitation. Tricuspid Valve: The tricuspid valve is grossly normal. Tricuspid valve regurgitation is moderate to severe. Aortic Valve: The aortic valve is tricuspid. Aortic valve regurgitation is not visualized. Pulmonic Valve: The pulmonic valve was grossly normal. Pulmonic valve regurgitation is not visualized. Aorta: The aortic root and ascending aorta are structurally normal, with no evidence of dilitation. Venous: The inferior vena cava is dilated in size with greater  than 50% respiratory variability, suggesting right atrial pressure of 8 mmHg. IAS/Shunts: The interatrial septum was not assessed.  LEFT VENTRICLE PLAX 2D LVIDd:         3.70 cm   Diastology LVIDs:         2.50 cm   LV e' medial:    6.89 cm/s LV PW:         0.80 cm   LV E/e' medial:  15.5 LV IVS:        0.60 cm   LV e' lateral:   9.89 cm/s LVOT diam:     2.00 cm   LV E/e' lateral: 10.8 LV SV:         45 LV SV Index:   34 LVOT Area:     3.14 cm  RIGHT VENTRICLE RV S prime:     9.16 cm/s TAPSE (M-mode): 1.7 cm LEFT ATRIUM             Index        RIGHT ATRIUM           Index LA diam:        3.00 cm 2.26 cm/m   RA Area:     10.70 cm LA Vol (A2C):   26.2 ml 19.73 ml/m  RA Volume:   24.70 ml  18.60 ml/m LA Vol (A4C):   26.4 ml 19.88 ml/m LA Biplane Vol: 27.9 ml 21.01 ml/m  AORTIC VALVE LVOT Vmax:   71.00 cm/s LVOT Vmean:  41.800 cm/s LVOT VTI:    0.143 m  AORTA Ao Root diam: 2.90 cm Ao Asc diam:  3.10 cm MITRAL VALVE                TRICUSPID VALVE MV Area (PHT): 4.89 cm     TR Peak grad:   56.2 mmHg MV Decel Time: 155 msec     TR Vmax:        375.00 cm/s MV E velocity: 107.00 cm/s MV A velocity: 43.30 cm/s   SHUNTS MV E/A ratio:  2.47         Systemic VTI:  0.14 m                             Systemic Diam: 2.00 cm Manish Patwardhan MD Electronically signed by Vernell Leep MD Signature Date/Time: 05/11/2021/1:00:37 PM    Final       PFT Results Latest Ref Rng & Units 10/30/2014  FVC-Pre L 2.54  FVC-Predicted Pre % 74  Pre FEV1/FVC % % 49  FEV1-Pre L 1.25  FEV1-Predicted Pre % 49  DLCO uncorrected ml/min/mmHg 7.75  DLCO UNC% % 35  DLVA Predicted % 43  TLC L 6.76  TLC % Predicted % 125  RV % Predicted % 217    No results found for: NITRICOXIDE  Assessment & Plan:   Pulmonary embolism ([place]) Recent hospitalization with CT chest showing a chronic pulmonary embolism.  Patient has been treated with IV heparin and transition to Eliquis unfortunately insurance would not cover Eliquis.  We  have contacted the pharmacy to discuss coverage and possible alternatives.  Plan  Patient Instructions  Continue on BREZTRI 2 puffs Twice daily  rinse after use.  Work on not smoking  Continue on Oxygen 2-3l/m   , goal is to have O2 sats >88-90%.  Activity as tolerated  Boost between meals Twice daily   Begin Eliquis as directed. - will contact pharmacy  Begin BIPAP At bedtime  and naps.  Follow up with Dr. Lamonte Sakai  in 3-4 weeks and As needed   Please contact office for sooner follow up if symptoms do not improve or worsen or seek emergency care         Protein-calorie malnutrition, severe ([place]) Recommend high-protein diet  Lung cancer ([place]) Recurrent lung cancer with adenocarcinoma status post XRT last month. Previous cancer 2016 and 2019 with XRT  COPD with acute exacerbation ([place]) Recent exacerbation patient is encouraged on smoking cessation.  We will continue on triple therapy maintenance inhaler with Breztri.  Also prone to frequent exacerbations with decompensated acute on chronic hypercarbic and hypoxic respiratory failure.  Patient was recommended for noninvasive vent support with trilogy.  Unfortunately insurance did not cover.  Will cover BiPAP ST. will change over to this per insurance requirements  Plan  Patient Instructions  Continue on BREZTRI 2 puffs Twice daily  rinse after use.  Work on not smoking  Continue on Oxygen 2-3l/m   , goal is to have O2 sats >88-90%.  Activity as tolerated  Boost between meals Twice daily   Begin Eliquis as directed. - will contact pharmacy  Begin BIPAP At bedtime  and naps.  Follow up with Dr. Lamonte Sakai  in 3-4 weeks and As needed   Please contact office for sooner follow up if symptoms do not improve or worsen or seek emergency care      '  I spent  41  minutes dedicated to the care of this patient on the date of this encounter to include pre-visit review of records, face-to-face time with the patient discussing conditions above,  post visit ordering of testing, clinical documentation with the electronic health record, making appropriate referrals as documented, and communicating necessary findings to members of the patients care team.    Rexene Edison, NP 05/28/2021

## 2021-05-28 NOTE — Addendum Note (Signed)
Addended by: Melvenia Needles on: 05/28/2021 05:56 PM   Modules accepted: Orders

## 2021-05-28 NOTE — Assessment & Plan Note (Addendum)
Recent hospitalization with CT chest showing a chronic pulmonary embolism.  Patient has been treated with IV heparin and transition to Eliquis unfortunately insurance would not cover Eliquis.  We have contacted the pharmacy to discuss coverage and possible alternatives.  Plan  Patient Instructions  Continue on BREZTRI 2 puffs Twice daily  rinse after use.  Work on not smoking  Continue on Oxygen 2-3l/m   , goal is to have O2 sats >88-90%.  Activity as tolerated  Boost between meals Twice daily   Begin Eliquis as directed. - will contact pharmacy  Begin BIPAP At bedtime  and naps.  Follow up with Dr. Lamonte Sakai  in 3-4 weeks and As needed   Please contact office for sooner follow up if symptoms do not improve or worsen or seek emergency care     Late add: called pharmacy Eliquis not covered  Will cover Xarelto . Begin Xarelto starter pack .  Patient called .

## 2021-05-28 NOTE — Assessment & Plan Note (Signed)
Recurrent lung cancer with adenocarcinoma status post XRT last month. Previous cancer 2016 and 2019 with XRT

## 2021-05-28 NOTE — Telephone Encounter (Signed)
Called [facility] and made them aware that BI was aware that pt was going to be followed by palliative care and they verbalized understanding. Nothing further needed.

## 2021-05-28 NOTE — Addendum Note (Signed)
Addended by: Vanessa Barbara on: 05/28/2021 05:47 PM   Modules accepted: Orders

## 2021-05-28 NOTE — Assessment & Plan Note (Signed)
Recent exacerbation patient is encouraged on smoking cessation.  We will continue on triple therapy maintenance inhaler with Breztri.  Also prone to frequent exacerbations with decompensated acute on chronic hypercarbic and hypoxic respiratory failure.  Patient was recommended for noninvasive vent support with trilogy.  Unfortunately insurance did not cover.  Will cover BiPAP ST. will change over to this per insurance requirements  Plan  Patient Instructions  Continue on BREZTRI 2 puffs Twice daily  rinse after use.  Work on not smoking  Continue on Oxygen 2-3l/m   , goal is to have O2 sats >88-90%.  Activity as tolerated  Boost between meals Twice daily   Begin Eliquis as directed. - will contact pharmacy  Begin BIPAP At bedtime  and naps.  Follow up with Dr. Lamonte Sakai  in 3-4 weeks and As needed   Please contact office for sooner follow up if symptoms do not improve or worsen or seek emergency care      '

## 2021-05-28 NOTE — Assessment & Plan Note (Signed)
Recommend high-protein diet

## 2021-05-28 NOTE — Telephone Encounter (Signed)
I guess that is fine if DME and insurance require ONO on RA .

## 2021-05-28 NOTE — Telephone Encounter (Signed)
Will order BI-pap did you want the ONO on RA as well?   Please advise. Thanks

## 2021-05-28 NOTE — Telephone Encounter (Signed)
Okay Can start BIPAP ST  Set pressure  -IPAP 12cmH2O , EPAP 5 cm H2O , Back up rate 16 bpm.

## 2021-05-28 NOTE — Telephone Encounter (Signed)
All orders have been placed.   Melissa notified via community message that orders have been placed.   Nothing further needed at this time.

## 2021-05-28 NOTE — Telephone Encounter (Signed)
Spoke with Melissa at [facility]  Pt recently d/c from the hospital and they have order NIV  [employer] has denied NIV but will cover BIPAP ST  Pt has appt with Tammy 05/28/21 at 3 pm  Per Melissa- can we please order BIPAP ST with back up rate and ONO on RA?

## 2021-05-28 NOTE — Telephone Encounter (Signed)
Lenna Sciara is returning phone call. Melissa phone number is [phone].

## 2021-05-29 DIAGNOSIS — F17210 Nicotine dependence, cigarettes, uncomplicated: Secondary | ICD-10-CM | POA: Diagnosis not present

## 2021-05-29 DIAGNOSIS — I081 Rheumatic disorders of both mitral and tricuspid valves: Secondary | ICD-10-CM | POA: Diagnosis not present

## 2021-05-29 DIAGNOSIS — J9621 Acute and chronic respiratory failure with hypoxia: Secondary | ICD-10-CM | POA: Diagnosis not present

## 2021-05-29 DIAGNOSIS — E43 Unspecified severe protein-calorie malnutrition: Secondary | ICD-10-CM | POA: Diagnosis not present

## 2021-05-29 DIAGNOSIS — F32A Depression, unspecified: Secondary | ICD-10-CM | POA: Diagnosis not present

## 2021-05-29 DIAGNOSIS — R64 Cachexia: Secondary | ICD-10-CM | POA: Diagnosis not present

## 2021-05-29 DIAGNOSIS — C3431 Malignant neoplasm of lower lobe, right bronchus or lung: Secondary | ICD-10-CM | POA: Diagnosis not present

## 2021-05-29 DIAGNOSIS — I771 Stricture of artery: Secondary | ICD-10-CM | POA: Diagnosis not present

## 2021-05-29 DIAGNOSIS — I5033 Acute on chronic diastolic (congestive) heart failure: Secondary | ICD-10-CM | POA: Diagnosis not present

## 2021-05-29 DIAGNOSIS — E871 Hypo-osmolality and hyponatremia: Secondary | ICD-10-CM | POA: Diagnosis not present

## 2021-05-29 DIAGNOSIS — I2510 Atherosclerotic heart disease of native coronary artery without angina pectoris: Secondary | ICD-10-CM | POA: Diagnosis not present

## 2021-05-29 DIAGNOSIS — I110 Hypertensive heart disease with heart failure: Secondary | ICD-10-CM | POA: Diagnosis not present

## 2021-05-29 DIAGNOSIS — Z7982 Long term (current) use of aspirin: Secondary | ICD-10-CM | POA: Diagnosis not present

## 2021-05-29 DIAGNOSIS — I272 Pulmonary hypertension, unspecified: Secondary | ICD-10-CM | POA: Diagnosis not present

## 2021-05-29 DIAGNOSIS — I2699 Other pulmonary embolism without acute cor pulmonale: Secondary | ICD-10-CM | POA: Diagnosis not present

## 2021-05-29 DIAGNOSIS — J439 Emphysema, unspecified: Secondary | ICD-10-CM | POA: Diagnosis not present

## 2021-05-29 DIAGNOSIS — I700 Atherosclerosis of aorta: Secondary | ICD-10-CM | POA: Diagnosis not present

## 2021-05-29 DIAGNOSIS — Z7951 Long term (current) use of inhaled steroids: Secondary | ICD-10-CM | POA: Diagnosis not present

## 2021-05-29 DIAGNOSIS — J9622 Acute and chronic respiratory failure with hypercapnia: Secondary | ICD-10-CM | POA: Diagnosis not present

## 2021-05-29 DIAGNOSIS — I70229 Atherosclerosis of native arteries of extremities with rest pain, unspecified extremity: Secondary | ICD-10-CM | POA: Diagnosis not present

## 2021-05-29 DIAGNOSIS — Z9181 History of falling: Secondary | ICD-10-CM | POA: Diagnosis not present

## 2021-05-29 DIAGNOSIS — K559 Vascular disorder of intestine, unspecified: Secondary | ICD-10-CM | POA: Diagnosis not present

## 2021-05-29 DIAGNOSIS — C3412 Malignant neoplasm of upper lobe, left bronchus or lung: Secondary | ICD-10-CM | POA: Diagnosis not present

## 2021-05-29 DIAGNOSIS — R627 Adult failure to thrive: Secondary | ICD-10-CM | POA: Diagnosis not present

## 2021-05-31 NOTE — Addendum Note (Signed)
Addended by: Vanessa Barbara on: 05/31/2021 12:26 PM   Modules accepted: Orders

## 2021-06-04 ENCOUNTER — Telehealth: Payer: Self-pay

## 2021-06-04 ENCOUNTER — Telehealth: Payer: Self-pay | Admitting: Pulmonary Disease

## 2021-06-04 MED ORDER — BREZTRI AEROSPHERE 160-9-4.8 MCG/ACT IN AERO: 2.0000 | INHALATION_SPRAY | Freq: Two times a day (BID) | RESPIRATORY_TRACT | 0 refills | Status: DC

## 2021-06-04 MED ORDER — BREZTRI AEROSPHERE 160-9-4.8 MCG/ACT IN AERO: 2.0000 | INHALATION_SPRAY | Freq: Two times a day (BID) | RESPIRATORY_TRACT | 6 refills | Status: DC

## 2021-06-04 NOTE — Telephone Encounter (Signed)
Called and spoke with [name] (DPR), she states that he did not receive the prescription sent in on 12/8 and has used all of the samples sent home with him on 12/20.  Advised that a prescription has been sent to the [facility] in [place].  She requested a sample to pick up.  Sample placed up front for her to pick up.  Nothing further needed.

## 2021-06-04 NOTE — Telephone Encounter (Signed)
Spoke with patient's SO [name] and scheduled an in-person Palliative Consult for 06/19/21 @ 1:30PM.   COVID screening was negative. One dog in the home will put away.  Patient lives with [name].  Consent obtained; updated Outlook/Netsmart/Team List and Epic.   Family is aware they may be receiving a call from provider the day before or day of to confirm appointment.

## 2021-06-04 NOTE — Addendum Note (Signed)
Addended by: Vanessa Barbara on: 06/04/2021 11:58 AM   Modules accepted: Orders

## 2021-06-05 DIAGNOSIS — E43 Unspecified severe protein-calorie malnutrition: Secondary | ICD-10-CM | POA: Diagnosis not present

## 2021-06-05 DIAGNOSIS — I272 Pulmonary hypertension, unspecified: Secondary | ICD-10-CM | POA: Diagnosis not present

## 2021-06-05 DIAGNOSIS — K559 Vascular disorder of intestine, unspecified: Secondary | ICD-10-CM | POA: Diagnosis not present

## 2021-06-05 DIAGNOSIS — J9621 Acute and chronic respiratory failure with hypoxia: Secondary | ICD-10-CM | POA: Diagnosis not present

## 2021-06-05 DIAGNOSIS — J439 Emphysema, unspecified: Secondary | ICD-10-CM | POA: Diagnosis not present

## 2021-06-05 DIAGNOSIS — I771 Stricture of artery: Secondary | ICD-10-CM | POA: Diagnosis not present

## 2021-06-05 DIAGNOSIS — I081 Rheumatic disorders of both mitral and tricuspid valves: Secondary | ICD-10-CM | POA: Diagnosis not present

## 2021-06-05 DIAGNOSIS — I700 Atherosclerosis of aorta: Secondary | ICD-10-CM | POA: Diagnosis not present

## 2021-06-05 DIAGNOSIS — C3412 Malignant neoplasm of upper lobe, left bronchus or lung: Secondary | ICD-10-CM | POA: Diagnosis not present

## 2021-06-05 DIAGNOSIS — J9622 Acute and chronic respiratory failure with hypercapnia: Secondary | ICD-10-CM | POA: Diagnosis not present

## 2021-06-05 DIAGNOSIS — Z9181 History of falling: Secondary | ICD-10-CM | POA: Diagnosis not present

## 2021-06-05 DIAGNOSIS — E871 Hypo-osmolality and hyponatremia: Secondary | ICD-10-CM | POA: Diagnosis not present

## 2021-06-05 DIAGNOSIS — I5033 Acute on chronic diastolic (congestive) heart failure: Secondary | ICD-10-CM | POA: Diagnosis not present

## 2021-06-05 DIAGNOSIS — R627 Adult failure to thrive: Secondary | ICD-10-CM | POA: Diagnosis not present

## 2021-06-05 DIAGNOSIS — R64 Cachexia: Secondary | ICD-10-CM | POA: Diagnosis not present

## 2021-06-05 DIAGNOSIS — F17210 Nicotine dependence, cigarettes, uncomplicated: Secondary | ICD-10-CM | POA: Diagnosis not present

## 2021-06-05 DIAGNOSIS — I2510 Atherosclerotic heart disease of native coronary artery without angina pectoris: Secondary | ICD-10-CM | POA: Diagnosis not present

## 2021-06-05 DIAGNOSIS — I70229 Atherosclerosis of native arteries of extremities with rest pain, unspecified extremity: Secondary | ICD-10-CM | POA: Diagnosis not present

## 2021-06-05 DIAGNOSIS — F32A Depression, unspecified: Secondary | ICD-10-CM | POA: Diagnosis not present

## 2021-06-05 DIAGNOSIS — Z7951 Long term (current) use of inhaled steroids: Secondary | ICD-10-CM | POA: Diagnosis not present

## 2021-06-05 DIAGNOSIS — Z7982 Long term (current) use of aspirin: Secondary | ICD-10-CM | POA: Diagnosis not present

## 2021-06-05 DIAGNOSIS — I2699 Other pulmonary embolism without acute cor pulmonale: Secondary | ICD-10-CM | POA: Diagnosis not present

## 2021-06-05 DIAGNOSIS — I110 Hypertensive heart disease with heart failure: Secondary | ICD-10-CM | POA: Diagnosis not present

## 2021-06-05 DIAGNOSIS — C3431 Malignant neoplasm of lower lobe, right bronchus or lung: Secondary | ICD-10-CM | POA: Diagnosis not present

## 2021-06-06 DIAGNOSIS — R0683 Snoring: Secondary | ICD-10-CM | POA: Diagnosis not present

## 2021-06-06 DIAGNOSIS — G4730 Sleep apnea, unspecified: Secondary | ICD-10-CM | POA: Diagnosis not present

## 2021-06-10 LAB — ACID FAST CULTURE WITH REFLEXED SENSITIVITIES (MYCOBACTERIA): Acid Fast Culture: NEGATIVE

## 2021-06-11 ENCOUNTER — Telehealth: Payer: Self-pay | Admitting: Pulmonary Disease

## 2021-06-12 ENCOUNTER — Other Ambulatory Visit: Payer: Self-pay | Admitting: *Deleted

## 2021-06-12 DIAGNOSIS — J449 Chronic obstructive pulmonary disease, unspecified: Secondary | ICD-10-CM

## 2021-06-12 NOTE — Telephone Encounter (Signed)
Called and spoke with Resa Miner (DPR), advised of recommendations per Eric Form NP, she verbalized understanding.  Humidification bottle order sent to [facility].  Nothing further needed.

## 2021-06-12 NOTE — Telephone Encounter (Signed)
Primary Pulmonologist: [facility] Last office visit and with whom: 12/2022022 Scott Edison NP What do we see them for (pulmonary problems): Acute Respiratory failure with hypoxia, COPD, malignant neoplasm of lung Last OV assessment/plan:   Assessment & Plan Note by Melvenia Needles, NP at 05/28/2021 4:07 PM  Author: Melvenia Needles, NP Author Type: Nurse Practitioner Filed: 05/28/2021  4:07 PM  Note Status: Written Cosign: Cosign Not Required Encounter Date: 05/28/2021  Problem: Lung cancer [facility])  Editor: Melvenia Needles, NP (Nurse Practitioner)               Recurrent lung cancer with adenocarcinoma status post XRT last month. Previous cancer 2016 and 2019 with XRT        Assessment & Plan Note by Melvenia Needles, NP at 05/28/2021 4:06 PM  Author: Melvenia Needles, NP Author Type: Nurse Practitioner Filed: 05/28/2021  4:06 PM  Note Status: Written Cosign: Cosign Not Required Encounter Date: 05/28/2021  Problem: Protein-calorie malnutrition, severe ([place])  Editor: Melvenia Needles, NP (Nurse Practitioner)               Recommend high-protein diet        Patient Instructions by Melvenia Needles, NP at 05/28/2021 3:00 PM  Author: Melvenia Needles, NP Author Type: Nurse Practitioner Filed: 05/28/2021  3:51 PM  Note Status: Signed Cosign: Cosign Not Required Encounter Date: 05/28/2021  Editor: Melvenia Needles, NP (Nurse Practitioner)               Continue on BREZTRI 2 puffs Twice daily  rinse after use.  Work on not smoking  Continue on Oxygen 2-3l/m   , goal is to have O2 sats >88-90%.  Activity as tolerated  Boost between meals Twice daily   Begin Eliquis as directed. - will contact pharmacy  Begin BIPAP At bedtime  and naps.  Follow up with Dr. Lamonte Sakai  in 3-4 weeks and As needed   Please contact office for sooner follow up if symptoms do not improve or worsen or seek emergency care              Orthostatic Vitals Recorded in This Encounter   05/28/2021  1519      Patient Position: Sitting  BP Location: Right Arm  Cuff Size: Normal   Instructions  Continue on BREZTRI 2 puffs Twice daily  rinse after use.  Work on not smoking  Continue on Oxygen 2-3l/m   , goal is to have O2 sats >88-90%.  Activity as tolerated  Boost between meals Twice daily   Begin Eliquis as directed. - will contact pharmacy  Begin BIPAP At bedtime  and naps.  Follow up with Dr. Lamonte Sakai  in 3-4 weeks and As needed   Please contact office for sooner follow up if symptoms do not improve or worsen or seek emergency care       Was appointment offered to patient (explain)?  no   Reason for call: Called and spoke with [name] (DPR), patient had a nose bleed x1 for a few minutes with moderate bleeding.  Patient stopped taking the Alen Blew because this happened previously when he was taking Plavix.  She wants to know if he can just go back to taking an asprin.  Advised to use saline nasal spray and saline nasal gel for dryness.  I let her know that I would let our provider of the day know and will call back with recommendations.  She verbalized understanding.  Judson Roch,  please advise.  Thank you.  (examples of things to ask: : When did symptoms start? Fever? Cough? Productive? Color to sputum? More sputum than usual? Wheezing? Have you needed increased oxygen? Are you taking your respiratory medications? What over the counter measures have you tried?)  Allergies  Allergen Reactions   Prevacid [Lansoprazole] Other (See Comments)    Bloating ("became swollen with gas")   Varenicline Nausea And Vomiting and Other (See Comments)    Vivid, bad dreams also   Ciprofloxacin Other (See Comments)    GI Upset   Mirtazapine Other (See Comments)    Negatively affected sleep   Omeprazole Other (See Comments)    "Gas"    Immunization History  Administered Date(s) Administered   Fluad Quad(high Dose 65+) 03/20/2021   Influenza, High Dose Seasonal PF 04/29/2018   PFIZER(Purple  Top)SARS-COV-2 Vaccination 04/27/2020, 10/29/2020, 04/09/2021

## 2021-06-19 ENCOUNTER — Other Ambulatory Visit: Payer: Self-pay

## 2021-06-19 ENCOUNTER — Other Ambulatory Visit: Payer: Medicaid Other | Admitting: Hospice

## 2021-06-19 DIAGNOSIS — E43 Unspecified severe protein-calorie malnutrition: Secondary | ICD-10-CM

## 2021-06-19 DIAGNOSIS — C3412 Malignant neoplasm of upper lobe, left bronchus or lung: Secondary | ICD-10-CM

## 2021-06-19 DIAGNOSIS — J449 Chronic obstructive pulmonary disease, unspecified: Secondary | ICD-10-CM

## 2021-06-19 DIAGNOSIS — Z515 Encounter for palliative care: Secondary | ICD-10-CM

## 2021-06-19 NOTE — Progress Notes (Addendum)
[facility] Palliative Care Consult Note Telephone: [phone]  Fax: [phone]  PATIENT NAME: [name] [id number] [phone] (home)  DOB: [birth year] MRN: [id number]  PRIMARY CARE PROVIDER:    Janie Morning, DO,  [address] [place] [address] [phone]  REFERRING PROVIDER:   Janie Morning, [address] [place],  [place] [address] [phone]  RESPONSIBLE PARTY:  Self/Melanie - daughter Best number to call [phone] - Denita- significant other A nurse, works for [employer]     Name Relation Home Work [employer] Significant other [phone]     Unknown last name,[place] Granddaughter [phone]          I met face to face with patient and family at home . Palliative Care was asked to follow this patient by consultation request of  Janie Morning, DO to address advance care planning, complex medical decision making and goals of care clarification. Denita is with patient during visit. This is the initial visit.    ASSESSMENT AND / RECOMMENDATIONS:   Advance Care Planning: Our advance care planning conversation included a discussion about:    The value and importance of advance care planning  Difference between Hospice and Palliative care Exploration of goals of care in the event of a sudden injury or illness  Identification and preparation of a healthcare agent  Review and updating or creation of an  advance directive document . Decision not to resuscitate or to de-escalate disease focused treatments due to poor prognosis.  CODE STATUS: Patient considered changing his code status. Extensive discussion on ramifications and implications of code status. Patient affirmed he is a Do Not Resuscitate  Goals of Care: Goals include to maximize quality of life and symptom management. Patient wishes to regain back his strength and  be able to go outside in his yard as before. Patient/family is interested in hospice service when patient qualifies for it.   I spent  46 minutes providing this initial consultation. More than 50% of the time in this consultation was spent on counseling patient and coordinating communication. --------------------------------------------------------------------------------------------------------------------------------------  Symptom Management/Plan: Lung CA: completed 3 radiation treatments, follow up phone call with Oncologist 07/04/2020 to determine what next steps.  COPD: No exacerbation at this time. Managed with Breztri, Albuterol, Oxygen 3 L Min. Patient has quit smoking for about 2 weeks. Validation provided; education reinforced on the need and benefits of not smoking. Education on deep slow breathing. COntinue with flutter valve and Incentive as ordered. Followed by Pulmonologist 07/01/2021 Protein Caloric Malnutrition: Albumin 3.2, Current weight 77.6 Ibs. Offer 4-6 small high nutrient/caloric meals. Continue Boost  nutritional drink BID. DASH diet discussed, encouraged to incorporate fruits and vegetables into his diet. Follow up visit with PCP as planned. Routine CBC CMP. Initiate Mirtazapine 7.5 mg at night time to help boost appetite.  Follow up: Palliative care will continue to follow for complex medical decision making, advance care planning, and clarification of goals. Return 6 weeks or prn. Encouraged to call provider sooner with any concerns.   Family /Caregiver/Community Supports: Patient lives at home with his significant.   HOSPICE ELIGIBILITY/DIAGNOSIS: TBD  Chief Complaint: Initial Palliative care visit  HISTORY OF PRESENT ILLNESS:  [name] is a 71 y.o. year old male  with multiple medical conditions including Lung Cancer initially in 2016, treated with radiation then, recurred again in 2019 and then again 2022. Pt has completed  3 radiation treatments, and has follow  up appointment with Oncology. Patient with Protein caloric malnutrition, chronic, related to debility from lung CA, worsened in the past 3 months following his recent hospitalization for pulmonary embolism. PCM is worsened with his being picky and poor appetite, for which he reports appetite is now picking up. Denita affirmed this. He continues with fatigue associated with PCM; overall weak and this impacts his independence and quality of life. History of COPD, Pulmonary embolism, CHF, Depression, HTN.  History obtained from review of EMR, discussion with primary team, caregiver, family and/or Mr. Froh.  Review and summarization of Epic records shows history from other than patient. Rest of 10 point ROS asked and negative.  I reviewed as needed, available labs, patient records, imaging, studies and related documents from the EMR.  ROS General: NAD EYES: denies vision changes ENMT: denies dysphagia Cardiovascular: denies chest pain/discomfort Pulmonary: denies cough, denies SOB Abdomen: endorses good appetite, denies constipation/diarrhea GU: denies dysuria, urinary frequency MSK:  endorses weakness,  no falls reported Skin: denies rashes or wounds Neurological: denies pain, denies insomnia Psych: Endorses positive mood Heme/lymph/immuno: denies bruises, abnormal bleeding  Physical Exam: Height/Weight; 5 feet 1 inch/ 77.6 Ibs Constitutional: NAD General: Well groomed, cooperative EYES: anicteric sclera, lids intact, no discharge  ENMT: Moist mucous membrane CV: S1 S2, RRR, no LE edema Pulmonary: LCTA, no increased work of breathing, no cough, Abdomen: active BS + 4 quadrants, soft and non tender GU: no suprapubic tenderness MSK: weakness, sarcopenia, ambulatory, sometimes with cane.  Skin: warm and dry, no rashes or wounds on visible skin Neuro:  weakness, otherwise non focal Psych: non-anxious affect Hem/lymph/immuno: no widespread bruising   PAST MEDICAL HISTORY:  Active  Ambulatory Problems    Diagnosis Date Noted   Pancreatic mass 11/23/2014   Cancer of upper lobe of left lung ([place]) 12/21/2014   Pneumothorax, traumatic    Pneumothorax of left lung after biopsy 01/04/2015   Former smoker 01/04/2015   Hyponatremia 01/05/2015   Hypokalemia 01/05/2015   COPD with acute exacerbation ([place]) 01/05/2015   Essential hypertension 01/05/2015   Protein-calorie malnutrition, severe ([place]) 01/05/2015   Primary cancer of right upper lobe of lung ([facility]) 11/30/2017   Severe claudication ([facility]) 03/28/2018   Incarcerated left inguinal hernia s/p repair 06/18/2018 06/14/2018   PAD (peripheral artery disease) (H[facility])    Acute hypoxemic respiratory failure ([facility])    SBO (small bowel obstruction) ([facility])    Inguinal hernia of left side with obstruction and without gangrene    On total parenteral nutrition (TPN)    Alcoholism ([place]) 07/26/2018   Memory loss 10/11/2018   Abnormal brain MRI 10/11/2018   Diverticulosis 01/31/2019   Internal hemorrhoids 09/17/2017   Nodule of lower lobe of left lung 01/22/2021   Lung nodules    Acute respiratory failure with hypoxia and hypercarbia ([place]) 05/10/2021   Pulmonary embolism ([facility]) 05/11/2021   Lung cancer ([facility]) 05/11/2021   Resolved Ambulatory Problems    Diagnosis Date Noted   Pneumothorax 24/23/5361   Acute metabolic encephalopathy    Past Medical History:  Diagnosis Date   Allergies    Anxiety    COPD (chronic obstructive pulmonary disease) ([facility])    Hypertension    Iliac artery stenosis, right (H[facility])    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[place])    Lower limb ischemia 03/26/2018   Lung cancer    Pancreatitis    Pulmonary hypertension ([place])    Status post left foot surgery    Tobacco abuse  SOCIAL HX:  Social History   Tobacco Use   Smoking status: Every Day    Packs/day: 1.00    Years: 50.00    Pack years: 50.00    Types: Cigarettes   Smokeless tobacco: Never   Tobacco comments:    smoking 0.5 ppd as of 10/07/2018     Smoking 2-3 cigarettes per day 05/28/2021  Substance Use Topics   Alcohol use: Yes    Alcohol/week: 6.0 standard drinks    Types: 6 Cans of beer per week     FAMILY HX:  Family History  Problem Relation Age of Onset   COPD Mother    CAD Mother    Diabetes Father    Cancer Brother        metastatic, unknown primary   Cancer Paternal Grandmother       ALLERGIES:  Allergies  Allergen Reactions   Prevacid [Lansoprazole] Other (See Comments)    Bloating ("became swollen with gas")   Varenicline Nausea And Vomiting and Other (See Comments)    Vivid, bad dreams also   Ciprofloxacin Other (See Comments)    GI Upset   Mirtazapine Other (See Comments)    Negatively affected sleep   Omeprazole Other (See Comments)    "Gas"      PERTINENT MEDICATIONS:  Outpatient Encounter Medications as of 06/19/2021  Medication Sig   albuterol (PROVENTIL) (2.5 MG/3ML) 0.083% nebulizer solution Take 3 mLs (2.5 mg total) by nebulization every 2 (two) hours as needed for wheezing or shortness of breath.   albuterol (VENTOLIN HFA) 108 (90 Base) MCG/ACT inhaler Inhale 2 puffs into the lungs every 6 (six) hours as needed for shortness of breath.   Ascorbic Acid (VITAMIN C PO) Take 1 tablet by mouth daily. (Patient not taking: Reported on 05/28/2021)   aspirin 325 MG tablet Take 325 mg by mouth daily.   Budeson-Glycopyrrol-Formoterol (BREZTRI AEROSPHERE) 160-9-4.8 MCG/ACT AERO Inhale 2 puffs into the lungs 2 (two) times daily.   Budeson-Glycopyrrol-Formoterol (BREZTRI AEROSPHERE) 160-9-4.8 MCG/ACT AERO Inhale 2 puffs into the lungs in the morning and at bedtime.   Budeson-Glycopyrrol-Formoterol (BREZTRI AEROSPHERE) 160-9-4.8 MCG/ACT AERO Inhale 2 puffs into the lungs in the morning and at bedtime.   Budeson-Glycopyrrol-Formoterol (BREZTRI AEROSPHERE) 160-9-4.8 MCG/ACT AERO Inhale 2 puffs into the lungs in the morning and at bedtime.   buPROPion (WELLBUTRIN XL) 150 MG 24 hr tablet Take 150 mg by mouth every  morning.    CALCIUM PO Take 1 tablet by mouth daily.   Cholecalciferol (VITAMIN D-3 PO) Take 1 capsule by mouth daily.   fluticasone (FLONASE) 50 MCG/ACT nasal spray Place 2 sprays into both nostrils daily as needed for allergies.   furosemide (LASIX) 40 MG tablet Take 1 tablet (40 mg total) by mouth daily.   ipratropium (ATROVENT) 0.02 % nebulizer solution Take 0.25-0.5 mg by nebulization every 6 (six) hours as needed for wheezing or shortness of breath.   lactose free nutrition (BOOST) LIQD Take 237 mLs by mouth 3 (three) times daily between meals.   metoprolol succinate (TOPROL-XL) 25 MG 24 hr tablet Take 1 tablet (25 mg total) by mouth daily.   Multiple Vitamin (MULTIVITAMIN WITH MINERALS) TABS tablet Take 1 tablet by mouth every morning.   Nutritional Supplements (CARNATION BREAKFAST ESSENTIALS PO) Take 1 packet by mouth daily.   potassium chloride (KLOR-CON M) 10 MEQ tablet Take 1 tablet (10 mEq total) by mouth daily.   Pseudoeph-Bromphen-DM (BROMPHENIRAMINE-DM-PSE PO) Take 5 mLs by mouth 3 (three) times daily as needed (for  coughing).   [START ON 06/28/2021] rivaroxaban (XARELTO) 20 MG TABS tablet Take 1 tablet (20 mg total) by mouth daily with supper.   RIVAROXABAN (XARELTO) VTE STARTER PACK (15 & 20 MG) Follow package directions: Take one 47m tablet by mouth twice a day. On day 22, switch to one 269mtablet once a day. Take with food.   No facility-administered encounter medications on file as of 06/19/2021.    Thank you for the opportunity to participate in the care of Mr. PeHeinlen The palliative care team will continue to follow. Please call our office at [phone] we can be of additional assistance.   Note: Portions of this note were generated with DrLobbyistDictation errors may occur despite best attempts at proofreading.  LiTeodoro SprayNP

## 2021-06-20 ENCOUNTER — Telehealth: Payer: Self-pay | Admitting: Pulmonary Disease

## 2021-06-20 DIAGNOSIS — J449 Chronic obstructive pulmonary disease, unspecified: Secondary | ICD-10-CM

## 2021-06-20 NOTE — Telephone Encounter (Signed)
Called and spoke with [name] from [facility] and provided her with [name]'s fax number and also got the number to fax over last OV note to [phone]. Nothing further needed at this time.

## 2021-06-25 ENCOUNTER — Telehealth: Payer: Self-pay | Admitting: Pulmonary Disease

## 2021-06-26 NOTE — Telephone Encounter (Signed)
Tried to call Denita back to let her know that we had reached out to [employer] and that they were currently working on this but was not able to reach. Left her a message letting her know that info and that we would update her once we knew more.

## 2021-06-26 NOTE — Telephone Encounter (Signed)
Called and spoke with Denita letting her know that we would get the order placed for pt's O2 to be picked up from [employer] since they have gone with a different company for pt's O2 and she verbalized understanding. Order placed. Nothing further needed.

## 2021-06-26 NOTE — Addendum Note (Signed)
Addended by: Lorretta Harp on: 06/26/2021 02:04 PM   Modules accepted: Orders

## 2021-06-26 NOTE — Telephone Encounter (Signed)
Called and spoke with [name] who stated that pt's insurance denied the BIPAP therapy for pt. Stated to her that we will see what needs to be done in order for pt to be able to receive this and she verbalized understanding.  Community message has been sent to [employer] to try to figure out why pt was denied BIPAP. Will await response

## 2021-06-27 ENCOUNTER — Telehealth: Payer: Self-pay | Admitting: Pulmonary Disease

## 2021-06-27 NOTE — Telephone Encounter (Signed)
Checked [name]'s community msgs and nothing new from [employer] today as of yet.

## 2021-06-27 NOTE — Telephone Encounter (Signed)
Called and spoke with [name]. She verbalized understanding of recommendations.   Nothing further needed at time of call.

## 2021-06-27 NOTE — Telephone Encounter (Signed)
06/27/2021 01:42 PM EST by Carron Curie 06/27/2021 01:42 PM EST by Carron Curie  Incoming [place] ([place] [phone] (Home) [phone] (Home)   - [facility] calling with update on BIPAP order: [facility] [place], did not see a denial so they are going submit the order for authorization       I called Denita and notified of response per [employer]. She verbalized understanding. Nothing further needed.

## 2021-06-27 NOTE — Telephone Encounter (Signed)
Called and spoke with Denita. She stated that the patient's right leg and ankle has been swelling up over the past few days. The left leg was swollen earlier this week so he encouraged him to elevate the leg. The swelling in the left leg is now gone. Despite elevating the right leg, the swollen has not gone down. She denied any discoloration, heat or any injuries to the area. He is still taking his Xarelto. Denied any increased SOB, fever or chest pain.   He does have an appt on 07/01/21 at 145pm with BI.   BI, can you please advise? Thanks!

## 2021-06-28 ENCOUNTER — Encounter: Payer: Self-pay | Admitting: Urology

## 2021-06-28 ENCOUNTER — Telehealth: Payer: Self-pay | Admitting: Adult Health

## 2021-06-28 NOTE — Progress Notes (Signed)
Spoke w/ patient's significant other '[name]', verified her identity and begin nursing interview. She states Mr. Caydon Feasel is  "Doing well." No symptoms reported at this time.  Meaningful use complete.  Ms. Leslie Andrea notified of Mr. Zurawski 1:00pm-07/04/21 telephone appointment w/ Freeman Caldron PA-C. I left my extension [phone] in case patient needs to call. Ms. Leslie Andrea verbalized understanding of information.  Patient preferred contact (for this appointment) [phone]- Ms. Odom's cell.

## 2021-06-28 NOTE — Telephone Encounter (Signed)
Overnight oximetry test done on June 06, 2021 showed positive desaturations while sleeping.  O2 saturations less than 88% at 2 hours and 19 minutes.  Average O2 saturation was 86%, SPO2 low at 69%.  Patient is to continue on oxygen at bedtime. Last office visit patient was ordered a BiPAP ST please check to see if he is gotten this?  Can wear oxygen with his BiPAP at 2 L

## 2021-06-30 ENCOUNTER — Other Ambulatory Visit: Payer: Self-pay | Admitting: Adult Health

## 2021-07-01 ENCOUNTER — Other Ambulatory Visit: Payer: Self-pay

## 2021-07-01 ENCOUNTER — Encounter: Payer: Self-pay | Admitting: Pulmonary Disease

## 2021-07-01 ENCOUNTER — Ambulatory Visit (INDEPENDENT_AMBULATORY_CARE_PROVIDER_SITE_OTHER): Payer: 59 | Admitting: Pulmonary Disease

## 2021-07-01 VITALS — BP 138/78 | HR 95 | Temp 97.6°F | Ht 60.0 in | Wt 85.4 lb

## 2021-07-01 DIAGNOSIS — J432 Centrilobular emphysema: Secondary | ICD-10-CM

## 2021-07-01 DIAGNOSIS — E43 Unspecified severe protein-calorie malnutrition: Secondary | ICD-10-CM

## 2021-07-01 DIAGNOSIS — C3492 Malignant neoplasm of unspecified part of left bronchus or lung: Secondary | ICD-10-CM

## 2021-07-01 DIAGNOSIS — I272 Pulmonary hypertension, unspecified: Secondary | ICD-10-CM | POA: Diagnosis not present

## 2021-07-01 DIAGNOSIS — C3491 Malignant neoplasm of unspecified part of right bronchus or lung: Secondary | ICD-10-CM

## 2021-07-01 DIAGNOSIS — I2782 Chronic pulmonary embolism: Secondary | ICD-10-CM

## 2021-07-01 DIAGNOSIS — J9611 Chronic respiratory failure with hypoxia: Secondary | ICD-10-CM

## 2021-07-01 DIAGNOSIS — J441 Chronic obstructive pulmonary disease with (acute) exacerbation: Secondary | ICD-10-CM

## 2021-07-01 MED ORDER — FUROSEMIDE 40 MG PO TABS: 40.0000 mg | ORAL_TABLET | Freq: Every day | ORAL | 3 refills | Status: DC

## 2021-07-01 MED ORDER — POTASSIUM CHLORIDE CRYS ER 10 MEQ PO TBCR: 10.0000 meq | EXTENDED_RELEASE_TABLET | Freq: Every day | ORAL | 3 refills | Status: DC

## 2021-07-01 NOTE — Progress Notes (Signed)
Synopsis: Referred in October 2022 for lung nodules by Janie Morning, DO  Subjective:   PATIENT ID: [name] GENDER: male DOB: 09-01-1950, MRN: [id number]  Chief Complaint  Patient presents with   Follow-up    Follow up. Patients feet is swollen.     This is a 71 year old gentleman, past medical history of COPD, current smoker, hypertension, tobacco abuse.  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  The first 1 in 2016 and the other in 2019 both treated with SBRT.  He has had subsequent follow-up imaging with radiation oncology.  Recent restaging PET scan with a slowly enlarging lesion within the right lower lobe that they could not rule out inflammatory versus malignancy as well as a more solid peripheral subpleural nodule within the left upper lobe concerning for malignancy.  Patient was referred here for consideration for tissue biopsy of both lesions.  Patient states that he had percutaneous biopsy in the past and suffered a pneumothorax.  This was after biopsy of his right upper lobe apical lesion in 2019.  OV 07/01/2021: Here today for follow-up after bronchoscopy.  Patient was seen by Eric Form as well.He has already started treatments and completed SBRT with radiation oncology.  Salt Tammy in the office on 05/28/2021.  Currently on Xarelto medications had to be switched due to recent CTA with concern of chronic PE.  She encouraged smoking cessation.  Continuation of his triple therapy inhaler regimen.  Modifications to his diet due to his severe protein calorie malnutrition.  Unfortunately patient has had increasing swelling of the bilateral lower extremities.  He was on Lasix for approximately a month after discharge.  He did not have his Lasix refilled at the pharmacy.   Past Medical History:  Diagnosis Date   Allergies    being around corn & grain causes itchy eyes, nasal congestion. He can eat these with no problems.   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, s/p SBRT; poorly differentiated NSCLC RUL 11/23/17, s/p SBRT   Pancreatitis    x2 stents placed to make patent duct to pancreas   Pulmonary hypertension (HCC)    PASP 48 mmHg 03/26/18 (echo, [facility])   Severe claudication ([place])    Status post left foot surgery    Tobacco abuse      Family History  Problem Relation Age of Onset   COPD Mother    CAD Mother    Diabetes Father    Cancer Brother        metastatic, unknown primary   Cancer Paternal Grandmother      Past Surgical History:  Procedure Laterality Date   ABDOMINAL AORTOGRAM W/LOWER EXTREMITY N/A 03/30/2018   Procedure: ABDOMINAL AORTOGRAM W/LOWER EXTREMITY;  Surgeon: Nigel Mormon, MD;  Location: [facility];  Service: Cardiovascular;  Laterality: N/A;   BRONCHIAL BIOPSY  04/23/2021   Procedure: BRONCHIAL BIOPSIES;  Surgeon: Garner Nash, DO;  Location: [facility];  Service: Pulmonary;;   BRONCHIAL BRUSHINGS  04/23/2021   Procedure: BRONCHIAL BRUSHINGS;  Surgeon: Garner Nash, DO;  Location: [facility];  Service: Pulmonary;;   BRONCHIAL NEEDLE ASPIRATION BIOPSY  04/23/2021   Procedure: BRONCHIAL NEEDLE ASPIRATION BIOPSIES;  Surgeon: Garner Nash,  DO;  Location: [facility];  Service: Pulmonary;;   COLECTOMY     COLOSTOMY     COLOSTOMY REVERSAL     EUS N/A 12/14/2014   Procedure: UPPER ENDOSCOPIC ULTRASOUND (EUS) LINEAR;  Surgeon: Milus Banister, MD;  Location: [facility];  Service: Endoscopy;  Laterality: N/A;   FIDUCIAL MARKER PLACEMENT  04/23/2021   Procedure: FIDUCIAL MARKER PLACEMENT;  Surgeon: Garner Nash, DO;  Location: [facility];  Service: Pulmonary;;   FOOT SURGERY Left 2006   INGUINAL HERNIA REPAIR Left 06/18/2018   Procedure: OPEN  RECURRENT LEFT INGUINAL HERNIA REPAIR WITH MESH;  Surgeon: Armandina Gemma, MD;  Location: [facility];  Service: General;  Laterality: Left;   INSERTION OF MESH Left 06/18/2018   Procedure: INSERTION OF MESH;  Surgeon: Armandina Gemma, MD;  Location: [facility];  Service: General;  Laterality: Left;   LAPAROTOMY N/A 06/30/2018   Procedure: EXPLORATORY LAPAROTOMY LYSISIS OF ADHESIONS;  Surgeon: Jovita Kussmaul, MD;  Location: [facility];  Service: General;  Laterality: N/A;   PANCREAS SURGERY  2007   PERIPHERAL VASCULAR INTERVENTION  03/30/2018   Procedure: PERIPHERAL VASCULAR INTERVENTION;  Surgeon: Nigel Mormon, MD;  Location: [facility];  Service: Cardiovascular;;  Rt Iliac   UPPER GI ENDOSCOPY     VENTRAL HERNIA REPAIR  1967   VIDEO BRONCHOSCOPY WITH RADIAL ENDOBRONCHIAL ULTRASOUND  04/23/2021   Procedure: VIDEO BRONCHOSCOPY WITH RADIAL ENDOBRONCHIAL ULTRASOUND;  Surgeon: Garner Nash, DO;  Location: [facility];  Service: Pulmonary;;    Social History   Socioeconomic History   Marital status: Significant Other    Spouse name: Not on file   Number of children: 3   Years of education: 12   Highest education level: Not on file  Occupational History   Occupation: Retired  Tobacco Use   Smoking status: Every Day    Packs/day: 1.00    Years: 50.00    Pack years: 50.00    Types: Cigarettes   Smokeless tobacco: Never   Tobacco comments:    smoking 0.5 ppd as of 10/07/2018    Smoking 2-3 cigarettes per day 05/28/2021  Vaping Use   Vaping Use: Former  Substance and Sexual Activity   Alcohol use: Yes    Alcohol/week: 6.0 standard drinks    Types: 6 Cans of beer per week   Drug use: Yes    Types: Marijuana    Comment: Last use 04/20/21   Sexual activity: Yes    Birth control/protection: Injection  Other Topics Concern   Not on file  Social History Narrative   Lives at home w/ a friend   Left-handed   Caffeine: 2 cups of coffee per day   Social Determinants of Health    Financial Resource Strain: Not on file  Food Insecurity: Not on file  Transportation Needs: Not on file  Physical Activity: Not on file  Stress: Not on file  Social Connections: Not on file  Intimate Partner Violence: Not on file     Allergies  Allergen Reactions   Prevacid [Lansoprazole] Other (See Comments)    Bloating ("became swollen with gas")   Varenicline Nausea And Vomiting and Other (See Comments)    Vivid, bad dreams also   Ciprofloxacin Other (See Comments)    GI Upset   Mirtazapine Other (See Comments)    Negatively affected sleep   Omeprazole Other (See Comments)    "Gas"     Outpatient Medications Prior to Visit  Medication Sig Dispense Refill  albuterol (PROVENTIL) (2.5 MG/3ML) 0.083% nebulizer solution Take 3 mLs (2.5 mg total) by nebulization every 2 (two) hours as needed for wheezing or shortness of breath. 75 mL 1   albuterol (VENTOLIN HFA) 108 (90 Base) MCG/ACT inhaler Inhale 2 puffs into the lungs every 6 (six) hours as needed for shortness of breath. 1 each 3   aspirin 325 MG tablet Take 325 mg by mouth daily.     Budeson-Glycopyrrol-Formoterol (BREZTRI AEROSPHERE) 160-9-4.8 MCG/ACT AERO Inhale 2 puffs into the lungs 2 (two) times daily. 5.9 g 3   Budeson-Glycopyrrol-Formoterol (BREZTRI AEROSPHERE) 160-9-4.8 MCG/ACT AERO Inhale 2 puffs into the lungs in the morning and at bedtime. 5.9 g 0   Budeson-Glycopyrrol-Formoterol (BREZTRI AEROSPHERE) 160-9-4.8 MCG/ACT AERO Inhale 2 puffs into the lungs in the morning and at bedtime. 10.7 g 6   Budeson-Glycopyrrol-Formoterol (BREZTRI AEROSPHERE) 160-9-4.8 MCG/ACT AERO Inhale 2 puffs into the lungs in the morning and at bedtime. 5.9 g 0   buPROPion (WELLBUTRIN XL) 150 MG 24 hr tablet Take 150 mg by mouth every morning.      CALCIUM PO Take 1 tablet by mouth daily.     Cholecalciferol (VITAMIN D-3 PO) Take 1 capsule by mouth daily.     fluticasone (FLONASE) 50 MCG/ACT nasal spray Place 2 sprays into both nostrils  daily as needed for allergies.     furosemide (LASIX) 40 MG tablet Take 1 tablet (40 mg total) by mouth daily. 30 tablet 2   ipratropium (ATROVENT) 0.02 % nebulizer solution Take 0.25-0.5 mg by nebulization every 6 (six) hours as needed for wheezing or shortness of breath.     lactose free nutrition (BOOST) LIQD Take 237 mLs by mouth 3 (three) times daily between meals.     metoprolol succinate (TOPROL-XL) 25 MG 24 hr tablet Take 1 tablet (25 mg total) by mouth daily. 30 tablet 2   Multiple Vitamin (MULTIVITAMIN WITH MINERALS) TABS tablet Take 1 tablet by mouth every morning.     Nutritional Supplements (CARNATION BREAKFAST ESSENTIALS PO) Take 1 packet by mouth daily.     potassium chloride (KLOR-CON M) 10 MEQ tablet Take 1 tablet (10 mEq total) by mouth daily. 30 tablet 2   Pseudoeph-Bromphen-DM (BROMPHENIRAMINE-DM-PSE PO) Take 5 mLs by mouth 3 (three) times daily as needed (for coughing).     rivaroxaban (XARELTO) 20 MG TABS tablet Take 1 tablet (20 mg total) by mouth daily with supper. 30 tablet 2   RIVAROXABAN (XARELTO) VTE STARTER PACK (15 & 20 MG) Follow package directions: Take one 15mg  tablet by mouth twice a day. On day 22, switch to one 20mg  tablet once a day. Take with food. 51 each 0   Ascorbic Acid (VITAMIN C PO) Take 1 tablet by mouth daily. (Patient not taking: Reported on 05/28/2021)     No facility-administered medications prior to visit.    Review of Systems  Constitutional:  Negative for chills, fever, malaise/fatigue and weight loss.  HENT:  Negative for hearing loss, sore throat and tinnitus.   Eyes:  Negative for blurred vision and double vision.  Respiratory:  Positive for shortness of breath. Negative for cough, hemoptysis, sputum production, wheezing and stridor.   Cardiovascular:  Positive for leg swelling. Negative for chest pain, palpitations, orthopnea and PND.  Gastrointestinal:  Negative for abdominal pain, constipation, diarrhea, heartburn, nausea and vomiting.   Genitourinary:  Negative for dysuria, hematuria and urgency.  Musculoskeletal:  Negative for joint pain and myalgias.  Skin:  Negative for itching and rash.  Neurological:  Negative for dizziness, tingling, weakness and headaches.  Endo/Heme/Allergies:  Negative for environmental allergies. Does not bruise/bleed easily.  Psychiatric/Behavioral:  Negative for depression. The patient is not nervous/anxious and does not have insomnia.   All other systems reviewed and are negative.   Objective:  Physical Exam Vitals reviewed.  Constitutional:      General: He is not in acute distress.    Appearance: He is well-developed.     Comments: Frail thin low BMI  HENT:     Head: Normocephalic and atraumatic.     Mouth/Throat:     Pharynx: No oropharyngeal exudate.  Eyes:     Conjunctiva/sclera: Conjunctivae normal.     Pupils: Pupils are equal, round, and reactive to light.  Neck:     Vascular: No JVD.     Trachea: No tracheal deviation.     Comments: Loss of supraclavicular fat Cardiovascular:     Rate and Rhythm: Normal rate and regular rhythm.     Heart sounds: S1 normal and S2 normal.     Comments: Distant heart tones Pulmonary:     Effort: No tachypnea or accessory muscle usage.     Breath sounds: No stridor. Decreased breath sounds (throughout all lung fields) present. No wheezing, rhonchi or rales.     Comments: Diminished breath sounds Abdominal:     General: Bowel sounds are normal. There is no distension.     Palpations: Abdomen is soft.     Tenderness: There is no abdominal tenderness.  Musculoskeletal:        General: Deformity (muscle wasting ) present.     Right lower leg: Edema present.     Left lower leg: Edema present.  Skin:    General: Skin is warm and dry.     Capillary Refill: Capillary refill takes less than 2 seconds.     Findings: No rash.  Neurological:     Mental Status: He is alert and oriented to person, place, and time.  Psychiatric:         Behavior: Behavior normal.     Vitals:   07/01/21 1353  BP: 138/78  Pulse: 95  Temp: 97.6 F (36.4 C)  TempSrc: Oral  SpO2: 96%  Weight: 85 lb 6.4 oz (38.7 kg)  Height: 5' (1.524 m)    96% on RA BMI Readings from Last 3 Encounters:  07/01/21 16.68 kg/m  05/28/21 15.80 kg/m  05/10/21 15.52 kg/m   Wt Readings from Last 3 Encounters:  07/01/21 85 lb 6.4 oz (38.7 kg)  05/28/21 83 lb 9.6 oz (37.9 kg)  05/10/21 84 lb 14 oz (38.5 kg)     CBC    Component Value Date/Time   WBC 11.4 (H) 05/15/2021 0451   RBC 3.83 (L) 05/15/2021 0451   HGB 12.1 (L) 05/15/2021 0451   HGB 14.0 11/20/2014 1352   HCT 36.4 (L) 05/15/2021 0451   HCT 42.2 11/20/2014 1352   PLT 361 05/15/2021 0451   PLT 206 11/20/2014 1352   MCV 95.0 05/15/2021 0451   MCV 93.2 11/20/2014 1352   MCH 31.6 05/15/2021 0451   MCHC 33.2 05/15/2021 0451   RDW 14.3 05/15/2021 0451   RDW 15.2 (H) 11/20/2014 1352   LYMPHSABS 0.4 (L) 05/10/2021 2030   LYMPHSABS 1.2 11/20/2014 1352   MONOABS 0.9 05/10/2021 2030   MONOABS 0.8 11/20/2014 1352   EOSABS 0.0 05/10/2021 2030   EOSABS 0.2 11/20/2014 1352   BASOSABS 0.0 05/10/2021 2030   BASOSABS 0.1 11/20/2014 1352  Chest Imaging: 01/16/2021 CT chest: Left upper lobe subpleural lesion concerning for malignancy, a slowly enlarging opacity within the left upper lobe along the major fissure line as well as right lower lobe subpleural lesion difficult to exclude malignancy.  This is a gentleman that already had 2 previous lung cancers. The patient's images have been independently reviewed by me.    Pulmonary Functions Testing Results: PFT Results Latest Ref Rng & Units 10/30/2014  FVC-Pre L 2.54  FVC-Predicted Pre % 74  Pre FEV1/FVC % % 49  FEV1-Pre L 1.25  FEV1-Predicted Pre % 49  DLCO uncorrected ml/min/mmHg 7.75  DLCO UNC% % 35  DLVA Predicted % 43  TLC L 6.76  TLC % Predicted % 125  RV % Predicted % 217    FeNO:   Pathology:  2016 non-small cell lung  cancer, left 2019: Right upper lobe non-small cell lung cancer  Echocardiogram:   Echocardiogram 05/11/2021: 1. Left ventricular ejection fraction, by estimation, is 60 to 65%. The  left ventricle has normal function. The left ventricle has no regional  wall motion abnormalities. Left ventricular diastolic parameters are  consistent with Grade III diastolic  dysfunction (restrictive).   2. Right ventricular systolic function is low normal. The right  ventricular size is mildly enlarged. There is severely elevated pulmonary  artery systolic pressure. Estimated PASP 64 mmHg.   3. Left atrial size was mildly dilated.   4. The mitral valve is grossly normal. Trivial mitral valve  regurgitation.   5. Tricuspid valve regurgitation is moderate to severe.   6. The aortic valve is tricuspid. Aortic valve regurgitation is not  visualized.   7. The inferior vena cava is dilated in size with >50% respiratory  variability, suggesting right atrial pressure of 8 mmHg.   Heart Catheterization:     Assessment & Plan:     ICD-10-CM   1. Pulmonary hypertension (HCC)  I27.20 Magnesium    Basic Metabolic Panel (BMET)    CANCELED: Basic Metabolic Panel (BMET)    CANCELED: Magnesium    CANCELED: Magnesium    CANCELED: Basic Metabolic Panel (BMET)    2. Chronic hypoxemic respiratory failure (HCC)  J96.11     3. COPD with acute exacerbation ([place])  J44.1     4. Protein-calorie malnutrition, severe ([place])  E43     5. Other chronic pulmonary embolism without acute cor pulmonale (HCC)  I27.82     6. NSCLC of right lung ([place])  C34.91     7. NSCLC of left lung ([place])  C34.92     8. Centrilobular emphysema ([place])  J43.2        Discussion:  This is a 71 year old gentleman, current smoker, history of metachronous primary malignancies, non-small cell lung cancer of the right lung and left lung status post SBRT.  Patient has worsening lower extremity edema, he does have a history of blood clot.  Recent  CTA with evidence of chronic clot.  He has echocardiogram with elevated RVSP and clinical signs of pulmonary hypertension with bilateral lower extremity edema.  He also has severe protein calorie malnutrition which I think plays a role in his edematous lower extremities.  Plan: Overall his prognosis I think is poor. He has a BMI of 16 which does not for bode a good prognosis in the setting of COPD. Not to mention he is getting treatments for malignancy. Currently anticoagulated due to history of chronic clot His echo with grade 3 diastolic failure. I started him on 40 mg Lasix  daily with 10 mEq of potassium replacement. Repeat basic metabolic panel and magnesium in a week and a half or so. He is going to come back to the office and get labs drawn. With his history of chronic clot and echo with evidence of elevated RV pressures unguinal let Dr. Silas Flood take a look at his chart and see if there is anything else we could consider offering him.  Not sure that it would make much difference in his overall prognosis but could potentially help him feel some better. Return to clinic in approximately 4 weeks.   Current Outpatient Medications:    albuterol (PROVENTIL) (2.5 MG/3ML) 0.083% nebulizer solution, Take 3 mLs (2.5 mg total) by nebulization every 2 (two) hours as needed for wheezing or shortness of breath., Disp: 75 mL, Rfl: 1   albuterol (VENTOLIN HFA) 108 (90 Base) MCG/ACT inhaler, Inhale 2 puffs into the lungs every 6 (six) hours as needed for shortness of breath., Disp: 1 each, Rfl: 3   aspirin 325 MG tablet, Take 325 mg by mouth daily., Disp: , Rfl:    Budeson-Glycopyrrol-Formoterol (BREZTRI AEROSPHERE) 160-9-4.8 MCG/ACT AERO, Inhale 2 puffs into the lungs 2 (two) times daily., Disp: 5.9 g, Rfl: 3   Budeson-Glycopyrrol-Formoterol (BREZTRI AEROSPHERE) 160-9-4.8 MCG/ACT AERO, Inhale 2 puffs into the lungs in the morning and at bedtime., Disp: 5.9 g, Rfl: 0   Budeson-Glycopyrrol-Formoterol  (BREZTRI AEROSPHERE) 160-9-4.8 MCG/ACT AERO, Inhale 2 puffs into the lungs in the morning and at bedtime., Disp: 10.7 g, Rfl: 6   Budeson-Glycopyrrol-Formoterol (BREZTRI AEROSPHERE) 160-9-4.8 MCG/ACT AERO, Inhale 2 puffs into the lungs in the morning and at bedtime., Disp: 5.9 g, Rfl: 0   buPROPion (WELLBUTRIN XL) 150 MG 24 hr tablet, Take 150 mg by mouth every morning. , Disp: , Rfl:    CALCIUM PO, Take 1 tablet by mouth daily., Disp: , Rfl:    Cholecalciferol (VITAMIN D-3 PO), Take 1 capsule by mouth daily., Disp: , Rfl:    fluticasone (FLONASE) 50 MCG/ACT nasal spray, Place 2 sprays into both nostrils daily as needed for allergies., Disp: , Rfl:    furosemide (LASIX) 40 MG tablet, Take 1 tablet (40 mg total) by mouth daily., Disp: 30 tablet, Rfl: 2   ipratropium (ATROVENT) 0.02 % nebulizer solution, Take 0.25-0.5 mg by nebulization every 6 (six) hours as needed for wheezing or shortness of breath., Disp: , Rfl:    lactose free nutrition (BOOST) LIQD, Take 237 mLs by mouth 3 (three) times daily between meals., Disp: , Rfl:    metoprolol succinate (TOPROL-XL) 25 MG 24 hr tablet, Take 1 tablet (25 mg total) by mouth daily., Disp: 30 tablet, Rfl: 2   Multiple Vitamin (MULTIVITAMIN WITH MINERALS) TABS tablet, Take 1 tablet by mouth every morning., Disp: , Rfl:    Nutritional Supplements (CARNATION BREAKFAST ESSENTIALS PO), Take 1 packet by mouth daily., Disp: , Rfl:    potassium chloride (KLOR-CON M) 10 MEQ tablet, Take 1 tablet (10 mEq total) by mouth daily., Disp: 30 tablet, Rfl: 2   Pseudoeph-Bromphen-DM (BROMPHENIRAMINE-DM-PSE PO), Take 5 mLs by mouth 3 (three) times daily as needed (for coughing)., Disp: , Rfl:    rivaroxaban (XARELTO) 20 MG TABS tablet, Take 1 tablet (20 mg total) by mouth daily with supper., Disp: 30 tablet, Rfl: 2   RIVAROXABAN (XARELTO) VTE STARTER PACK (15 & 20 MG), Follow package directions: Take one 15mg  tablet by mouth twice a day. On day 22, switch to one 20mg  tablet once  a day. Take with food.,  Disp: 51 each, Rfl: 0   Ascorbic Acid (VITAMIN C PO), Take 1 tablet by mouth daily. (Patient not taking: Reported on 05/28/2021), Disp: , Rfl:     Garner Nash, DO [facility] 07/01/2021 2:11 PM

## 2021-07-01 NOTE — Patient Instructions (Signed)
Thank you for visiting Dr. Valeta Harms at [facility]. Today we recommend the following:  Orders Placed This Encounter  Procedures   Basic Metabolic Panel (BMET)   Magnesium   Meds ordered this encounter  Medications   furosemide (LASIX) 40 MG tablet    Sig: Take 1 tablet (40 mg total) by mouth daily.    Dispense:  30 tablet    Refill:  3   potassium chloride (KLOR-CON M) 10 MEQ tablet    Sig: Take 1 tablet (10 mEq total) by mouth daily.    Dispense:  30 tablet    Refill:  3   Return in about 4 weeks (around 07/29/2021) for Tammy Parrett or Dr. Valeta Harms .    Please do your part to reduce the spread of COVID-19.

## 2021-07-02 ENCOUNTER — Telehealth: Payer: Self-pay | Admitting: *Deleted

## 2021-07-02 NOTE — Telephone Encounter (Signed)
ATC home # x1.  LVM to return call. Called mobile # and spoke with Donita (DPR), provided results/recommendations per Rexene Edison NP.  She verbalized understanding.  She stated that he is getting his bipap on Thursday.  Advised he will need to use the oxygen with the bipap and to make sure when they bring the bipap that they have the adapter to connect the oxygen.  She stated that he saw Dr. Valeta Harms on 1/23 and she mistakenly told him he ran out of his Lasix, but he has been taking it consistently and still has swollen feet.  I let her know I would make Dr. Valeta Harms aware.

## 2021-07-02 NOTE — Telephone Encounter (Signed)
Called and spoke Donita [facility]) regarding ONO results.  During this call, she advised that she saw Dr. Valeta Harms on 1/23 and she told him that the patient had run out of his Lasix and his feet were swollen.  She states that he has been taking his Lasix as prescribed and his feet are still swollen.  I let her know I would make Dr. Valeta Harms aware.  Dr. Valeta Harms, please advise.  Thank you.

## 2021-07-03 ENCOUNTER — Telehealth: Payer: Self-pay | Admitting: Adult Health

## 2021-07-03 NOTE — Telephone Encounter (Signed)
ATC patient to notify. No answer. LVM to call back

## 2021-07-04 ENCOUNTER — Ambulatory Visit
Admission: RE | Admit: 2021-07-04 | Discharge: 2021-07-04 | Disposition: A | Discharge status: Home / Self Care | Payer: 59 | Source: Ambulatory Visit | Attending: Urology | Admitting: Urology

## 2021-07-04 DIAGNOSIS — C3432 Malignant neoplasm of lower lobe, left bronchus or lung: Secondary | ICD-10-CM | POA: Insufficient documentation

## 2021-07-04 DIAGNOSIS — C3412 Malignant neoplasm of upper lobe, left bronchus or lung: Secondary | ICD-10-CM

## 2021-07-04 DIAGNOSIS — C3411 Malignant neoplasm of upper lobe, right bronchus or lung: Secondary | ICD-10-CM

## 2021-07-04 DIAGNOSIS — C3431 Malignant neoplasm of lower lobe, right bronchus or lung: Secondary | ICD-10-CM

## 2021-07-04 MED ORDER — POTASSIUM CHLORIDE CRYS ER 10 MEQ PO TBCR: 10.0000 meq | EXTENDED_RELEASE_TABLET | Freq: Every day | ORAL | 3 refills | Status: DC

## 2021-07-04 NOTE — Progress Notes (Signed)
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     Indication for treatment:  Curative, Definitive SBRT       Radiation treatment dates:   05/16/21 - 05/23/21  Site/dose:   The targets in the LLL and RLL (PET positive) were treated to 54 Gy in 3 fractions of 18 Gy  Beams/energy:   The patient was treated using stereotactic body radiotherapy according to a 3D conformal radiotherapy plan.  Volumetric arc fields were employed to deliver 6 MV X-rays.  Image guidance was performed with per fraction cone beam CT prior to treatment under personal MD supervision.  Immobilization was achieved using BodyFix Pillow.  Narrative: The patient tolerated radiation treatment relatively well.     Plan: The patient has completed radiation treatment. The patient will return to radiation oncology clinic for routine followup in one month. I advised them to call or return sooner if they have any questions or concerns related to their recovery or treatment. ________________________________  Sheral Apley. Tammi Klippel, M.D.

## 2021-07-04 NOTE — Telephone Encounter (Signed)
Rx for pt's potassium was sent to [facility] for pt. Nothing further needed.

## 2021-07-04 NOTE — Progress Notes (Incomplete)
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     Interval Since Last Radiation:  6 weeks  05/16/21 - 05/23/21:   The targets in the LUL and RLL were treated to 54 Gy in 3 fractions of 18 Gy  12/07/2017 - 12/11/2017// Definitive SBRT:  The RUL target was treated to 54 Gy in 3 fractions of 18 Gy   02/01/2015- 02/08/2015// Definitive SBRT: The LUL target(s) was treated to 54 Gy in 3 fractions of 18 Gy  Narrative:  I spoke with the patient to conduct his routine scheduled 1 month follow up visit via telephone to spare the patient unnecessary potential exposure in the healthcare setting during the current COVID-19 pandemic.  The patient was notified in advance and gave permission to proceed with this visit format.  He tolerated the SBRT well and did not report any ill side effects aside from mild fatigue.                              On review of systems, the patient states ***  ALLERGIES:  is allergic to prevacid [lansoprazole], varenicline, ciprofloxacin, mirtazapine, and omeprazole.  Meds: Current Outpatient Medications  Medication Sig Dispense Refill   albuterol (PROVENTIL) (2.5 MG/3ML) 0.083% nebulizer solution Take 3 mLs (2.5 mg total) by nebulization every 2 (two) hours as needed for wheezing or shortness of breath. 75 mL 1   albuterol (VENTOLIN HFA) 108 (90 Base) MCG/ACT inhaler Inhale 2 puffs into the lungs every 6 (six) hours as needed for shortness of breath. 1 each 3   Ascorbic Acid (VITAMIN C PO) Take 1 tablet by mouth daily. (Patient  not taking: Reported on 05/28/2021)     aspirin 325 MG tablet Take 325 mg by mouth daily.     Budeson-Glycopyrrol-Formoterol (BREZTRI AEROSPHERE) 160-9-4.8 MCG/ACT AERO Inhale 2 puffs into the lungs 2 (two) times daily. 5.9 g 3   Budeson-Glycopyrrol-Formoterol (BREZTRI AEROSPHERE) 160-9-4.8 MCG/ACT AERO Inhale 2 puffs into the lungs in the morning and at bedtime. 5.9 g 0   Budeson-Glycopyrrol-Formoterol (BREZTRI AEROSPHERE) 160-9-4.8 MCG/ACT AERO Inhale 2 puffs into the lungs in the morning and at bedtime. 5.9 g 0   Budeson-Glycopyrrol-Formoterol (BREZTRI AEROSPHERE) 160-9-4.8 MCG/ACT AERO INHALE 2 PUFFS IN THE MORNING AND AT BEDTIME 22 g 1   buPROPion (WELLBUTRIN XL) 150 MG 24 hr tablet Take 150 mg by mouth every morning.      CALCIUM PO Take 1 tablet by mouth daily.     Cholecalciferol (VITAMIN D-3 PO) Take 1 capsule by mouth daily.     fluticasone (FLONASE) 50 MCG/ACT nasal spray Place 2 sprays into both nostrils daily as needed for allergies.     furosemide (LASIX) 40 MG tablet Take 1 tablet (40 mg total) by mouth daily. 30 tablet 2   furosemide (LASIX) 40 MG tablet Take 1 tablet (40 mg total) by mouth daily. 30 tablet 3   ipratropium (ATROVENT) 0.02 % nebulizer solution Take 0.25-0.5 mg by nebulization  every 6 (six) hours as needed for wheezing or shortness of breath.     lactose free nutrition (BOOST) LIQD Take 237 mLs by mouth 3 (three) times daily between meals.     metoprolol succinate (TOPROL-XL) 25 MG 24 hr tablet Take 1 tablet (25 mg total) by mouth daily. 30 tablet 2   Multiple Vitamin (MULTIVITAMIN WITH MINERALS) TABS tablet Take 1 tablet by mouth every morning.     Nutritional Supplements (CARNATION BREAKFAST ESSENTIALS PO) Take 1 packet by mouth daily.     potassium chloride (KLOR-CON M) 10 MEQ tablet Take 1 tablet (10 mEq total) by mouth daily. 30 tablet 3   Pseudoeph-Bromphen-DM (BROMPHENIRAMINE-DM-PSE PO) Take 5 mLs by mouth 3 (three) times daily as needed (for coughing).      rivaroxaban (XARELTO) 20 MG TABS tablet Take 1 tablet (20 mg total) by mouth daily with supper. 30 tablet 2   RIVAROXABAN (XARELTO) VTE STARTER PACK (15 & 20 MG) Follow package directions: Take one 15mg  tablet by mouth twice a day. On day 22, switch to one 20mg  tablet once a day. Take with food. 51 each 0   No current facility-administered medications for this encounter.    Physical Findings:  vitals were not taken for this visit.  Pain Assessment Pain Score: 0-No pain/10 Unable to assess due to telephone follow up visit format.  Lab Findings: Lab Results  Component Value Date   WBC 11.4 (H) 05/15/2021   HGB 12.1 (L) 05/15/2021   HCT 36.4 (L) 05/15/2021   MCV 95.0 05/15/2021   PLT 361 05/15/2021     Radiographic Findings: No results found.  Impression/Plan: 32. 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    He appears to have recovered well from the effects of his recent SBRT and is currently without complaints. We discussed the plan for repeat CT Chest in the next 1-2 weeks to assess his treatment response. Pending this scan appears stable, we will then proceed with serial CT Chest scan every 3-6 months to continue to closely monitor for any evidence of disease progression or recurrence. He appears to have a good understanding of these recommendations and is comfortable and in agreement. I will call him with results following each scan to review results and recommendations. He knows that he is welcome to call at any time in the interim with any questions or concerns related his previous radiation.    Nicholos Johns, PA-C

## 2021-07-08 ENCOUNTER — Telehealth: Payer: Self-pay | Admitting: *Deleted

## 2021-07-08 NOTE — Telephone Encounter (Signed)
Heather- do you still have this ONO?

## 2021-07-08 NOTE — Telephone Encounter (Signed)
CALLED PATIENT TO INFORM OF CT FOR 07-18-21- ARRIVAL TIME-10:45 AM @ [facility], PATIENT TO HAVE WATER ONLY - 4 HRS. PRIOR TO TEST, LABS TO BE DRAWN I- STAT IN RADIOLOGY, SAME DAY AS SCAN, PATIENT TO RECEIVE RESULTS FROM ALISON PERKINS ON 07-24-21 VIA TELEPHONE, LVM FOR A RETURN CALL

## 2021-07-09 ENCOUNTER — Ambulatory Visit (INDEPENDENT_AMBULATORY_CARE_PROVIDER_SITE_OTHER): Payer: 59

## 2021-07-09 ENCOUNTER — Encounter: Payer: Self-pay | Admitting: Nurse Practitioner

## 2021-07-09 ENCOUNTER — Other Ambulatory Visit: Payer: Medicaid Other

## 2021-07-09 ENCOUNTER — Other Ambulatory Visit: Payer: Self-pay

## 2021-07-09 ENCOUNTER — Ambulatory Visit (INDEPENDENT_AMBULATORY_CARE_PROVIDER_SITE_OTHER): Payer: 59 | Admitting: Nurse Practitioner

## 2021-07-09 VITALS — BP 124/96 | HR 90 | Temp 97.5°F | Ht 60.0 in | Wt 87.4 lb

## 2021-07-09 DIAGNOSIS — C3492 Malignant neoplasm of unspecified part of left bronchus or lung: Secondary | ICD-10-CM | POA: Insufficient documentation

## 2021-07-09 DIAGNOSIS — I272 Pulmonary hypertension, unspecified: Secondary | ICD-10-CM

## 2021-07-09 DIAGNOSIS — I499 Cardiac arrhythmia, unspecified: Secondary | ICD-10-CM | POA: Diagnosis not present

## 2021-07-09 DIAGNOSIS — J441 Chronic obstructive pulmonary disease with (acute) exacerbation: Secondary | ICD-10-CM

## 2021-07-09 DIAGNOSIS — I270 Primary pulmonary hypertension: Secondary | ICD-10-CM

## 2021-07-09 DIAGNOSIS — E43 Unspecified severe protein-calorie malnutrition: Secondary | ICD-10-CM

## 2021-07-09 DIAGNOSIS — I2782 Chronic pulmonary embolism: Secondary | ICD-10-CM

## 2021-07-09 DIAGNOSIS — J181 Lobar pneumonia, unspecified organism: Secondary | ICD-10-CM

## 2021-07-09 DIAGNOSIS — C3491 Malignant neoplasm of unspecified part of right bronchus or lung: Secondary | ICD-10-CM | POA: Insufficient documentation

## 2021-07-09 DIAGNOSIS — J9621 Acute and chronic respiratory failure with hypoxia: Secondary | ICD-10-CM | POA: Insufficient documentation

## 2021-07-09 DIAGNOSIS — J9611 Chronic respiratory failure with hypoxia: Secondary | ICD-10-CM

## 2021-07-09 DIAGNOSIS — J9612 Chronic respiratory failure with hypercapnia: Secondary | ICD-10-CM

## 2021-07-09 LAB — BASIC METABOLIC PANEL
BUN: 35 mg/dL — ABNORMAL HIGH (ref 6–23)
CO2: 31 mEq/L (ref 19–32)
Calcium: 9.2 mg/dL (ref 8.4–10.5)
Chloride: 95 mEq/L — ABNORMAL LOW (ref 96–112)
Creatinine, Ser: 0.81 mg/dL (ref 0.40–1.50)
GFR: 89.74 mL/min (ref >60.00)
Glucose, Bld: 155 mg/dL — ABNORMAL HIGH (ref 70–99)
Potassium: 4.7 mEq/L (ref 3.5–5.1)
Sodium: 131 mEq/L — ABNORMAL LOW (ref 135–145)

## 2021-07-09 LAB — MAGNESIUM: Magnesium: 1.8 mg/dL (ref 1.5–2.5)

## 2021-07-09 MED ORDER — PREDNISONE 10 MG PO TABS: ORAL_TABLET | ORAL | 0 refills | Status: DC

## 2021-07-09 NOTE — Assessment & Plan Note (Signed)
Recently completed SBRT and radiation oncology. Follow up with radiology/oncology as scheduled.

## 2021-07-09 NOTE — Assessment & Plan Note (Addendum)
Worsening BLE edema and SOB. Reported oliguria. CXR today to r/o infectious etiology. Awaiting BMET and mag results and then will make dose increase to lasix if kidney function unchanged. Complaint of some chest discomfort with SOB - EKG without t wave abnormality today. Discussed with Dr. Silas Flood and given pt's poor prognosis and frailty, unlikely candidate for further tx.

## 2021-07-09 NOTE — Assessment & Plan Note (Signed)
Stable oxygen saturations. Increased to 4 lpm with POC d/t increased SOB. Continue supplemental O2 for goal SpO2 >88-90%. Continue BiPAP therapy at night.

## 2021-07-09 NOTE — Patient Instructions (Addendum)
-  Continue Breztri 2 puffs Twice daily. Brush tongue and rinse mouth afterwards -Continue Albuterol inhaler 2 puffs or 3 mL neb every 6 hours as needed for shortness of breath or wheezing. Notify if symptoms persist despite rescue inhaler/neb use. -Continue ipatropium 0.25-0.5 mg neb every 6 hours as needed for shortness of breath or wheezing -Continue potassium 10 mEq daily -Continue Xarelto 20 mg daily with supper. Notify of any excessive bleeding/bruising -Continue supplemental oxygen at 2-4 lpm for goal oxygen saturation >88-90%. Notify of increasing requirements  -We will await your labs from today and likely increased your daily lasix  -Prednisone taper. 4 tabs for 2 days, then 3 tabs for 2 days, 2 tabs for 2 days, then 1 tab for 2 days, then stop. Take in AM with food.  Continue frequent, high protein meals throughout the day. Add Boost or Ensure 2-3 times a day.   Chest x ray today. We will notify you of any abnormal results.   EKG today.  Congratulations on quitting smoking!! Keep up the great work.   Follow up in one week with Dr. Valeta Harms or Alanson Aly. If symptoms do not improve or worsen, please contact office for sooner follow up or seek emergency care.

## 2021-07-09 NOTE — Progress Notes (Addendum)
@Patient  ID: [name], male    DOB: 1951/02/28, 71 y.o.   MRN: [id number]  Chief Complaint  Patient presents with   Acute Visit    Increase in sob and swelling in BLE since 1/23 visit with BI.  Swelling was in just his feet and is now above his sock line at calf.    Referring provider: Janie Morning, DO  HPI: 71 year old, current every day smoker (48 year hx) followed for COPD with emphysema, pulmonary hypertension, chronic respiratory failure, chronic pulmonary embolism. 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 He recently had a PET scan for restaging and found to have a slowly enlarging lesion within the RLL in November 2022. He is a patient of Dr. Juline Patch and last seen in office on 07/01/2021. Past medical history significant for HTN, PAD, protein-calorie malnutrition, inguinal hernia, alcoholism.   TEST/EVENTS:  Pathology: 2016 NSCLC left, 2019 NSCLC right upper lobe 01/16/2021 CT chest: Left upper lobe subpleural lesion concerning for malignancy, slowly enlarging opacity within the left upper lobe along the major fissure line as well as the right lower lobe subpleural lesion difficult to exclude malignancy. 05/11/2019 CTA chest: Single chronic appearing pulmonary embolus in the right lower lobe pulmonary artery.  Previously seen lower lobe nodule anteriorly has decreased slightly.  Increasing solid component within the right upper lobe.  Increasing solid component within the left upper lobe with postradiation fibrosis.  Atherosclerosis. 05/11/2021 echocardiogram: EF 60 to 65%.  G3 DD.  RV mildly enlarged.  Severely elevated PA pressure PASP 64 mmHg.  LA mildly dilated.  Trivial MVR.  Moderate to severe tricuspid valve regurgitation.  05/28/2021: OV with [place] NP for hospital follow up with dx of chronic PE. Tx with IV heparin in hospital and transitioned to Eliquis; insurance would not cover Eliquis.  Discussed alternatives with pharmacy and restarted on Xarelto. Continued on Breztri and prn albuterol. Initiated BiPAP at night. Continue supplemental O2 2-3 lpm.   07/01/2021: OV with Dr. Valeta Harms. Previously started tx and completed SBRT with radiation oncology. Increased swelling in BLE - d/c from hospital with lasix but did not have it refilled. Started on lasix 40 mg daily with 10 mEq of K replacement. Close follow up in 1 week for lab draw and APP OV. Consult with Dr. Silas Flood to discuss any possible alternative tx options for pulmn HTN.   07/09/2021: Today - follow up Patient presents today with wife for follow up after being seen last week for BLE edema. He thought he had ran out of his lasix but they report today that he actually hadn't and never stopped it. He reports worsening shortness of breath with exertion over the past few days. He is unable to complete simple tasks without getting short of breath. He has a non-productive cough. He feels as though he's drowning and he has difficulty breathing while laying flat. The swelling in his legs has increased, bilaterally, right more than left. He states he is not peeing very much. He has increased his oxygen to 4 lpm when on POC. He has occasional chest discomfort but not overt pain and attributes it to his SOB. His oxygen levels have been stable at home, >90%. He denies fevers, chills, PND, or hemoptysis. He continues on [place] Twice daily and has been using his rescue inhaler frequently but does not feel that it helps much. He continues on his Xarelto daily without any excessive  bleeding/bruising. He continues on BiPAP at night without difficulties.   Allergies  Allergen Reactions   Prevacid [Lansoprazole] Other (See Comments)    Bloating ("became swollen with gas")   Varenicline Nausea And Vomiting and Other (See Comments)    Vivid, bad dreams also   Ciprofloxacin Other (See Comments)    GI Upset   Mirtazapine Other (See Comments)     Negatively affected sleep   Omeprazole Other (See Comments)    "Gas"    Immunization History  Administered Date(s) Administered   Fluad Quad(high Dose 65+) 03/20/2021   Influenza, High Dose Seasonal PF 04/29/2018   Influenza, Quadrivalent, Recombinant, Inj, Pf 04/13/2017, 04/18/2019, 04/18/2020, 03/25/2021   Influenza, Seasonal, Injecte, Preservative Fre 02/20/2011, 03/09/2012   Influenza,inj,Quad PF,6+ Mos 06/17/2013, 05/19/2014   Influenza-Unspecified 02/22/2015, 02/20/2016   PFIZER(Purple Top)SARS-COV-2 Vaccination 04/27/2020, 10/29/2020, 04/09/2021   Pneumococcal Conjugate-13 04/13/2017   Pneumococcal Polysaccharide-23 09/21/2013   Pneumococcal-Unspecified 10/11/2018    Past Medical History:  Diagnosis Date   Allergies    being around corn & grain causes itchy eyes, nasal congestion. He can eat these with no problems.   Anxiety    COPD (chronic obstructive pulmonary disease) (HCC)    Hypertension    Iliac artery stenosis, right (HCC)    80% Stenosis   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[place])    Lower limb ischemia 03/26/2018   Lung cancer    LUL adenocarcinoma 12/28/14, s/p SBRT; poorly differentiated NSCLC RUL 11/23/17, s/p SBRT   Pancreatitis    x2 stents placed to make patent duct to pancreas   Pulmonary hypertension (HCC)    PASP 48 mmHg 03/26/18 (echo, [facility])   Severe claudication ([place])    Status post left foot surgery    Tobacco abuse     Tobacco History: Social History   Tobacco Use  Smoking Status Former   Packs/day: 1.00   Years: 50.00   Pack years: 50.00   Types: Cigarettes   Quit date: 06/25/2021   Years since quitting: 0.0  Smokeless Tobacco Never   Counseling given: Not Answered   Outpatient Medications Prior to Visit  Medication Sig Dispense Refill   albuterol (PROVENTIL) (2.5 MG/3ML) 0.083% nebulizer solution Take 3 mLs (2.5 mg total) by nebulization every 2 (two) hours as needed for wheezing or shortness of breath. 75 mL 1   albuterol  (VENTOLIN HFA) 108 (90 Base) MCG/ACT inhaler Inhale 2 puffs into the lungs every 6 (six) hours as needed for shortness of breath. 1 each 3   Ascorbic Acid (VITAMIN C PO) Take 1 tablet by mouth daily.     b complex vitamins capsule Take 1 capsule by mouth daily.     Budeson-Glycopyrrol-Formoterol (BREZTRI AEROSPHERE) 160-9-4.8 MCG/ACT AERO Inhale 2 puffs into the lungs 2 (two) times daily. 5.9 g 3   buPROPion (WELLBUTRIN XL) 150 MG 24 hr tablet Take 150 mg by mouth every morning.      CALCIUM PO Take 1 tablet by mouth daily.     Cholecalciferol (VITAMIN D-3 PO) Take 1 capsule by mouth daily.     fluticasone (FLONASE) 50 MCG/ACT nasal spray Place 2 sprays into both nostrils daily as needed for allergies.     furosemide (LASIX) 40 MG tablet Take 1 tablet (40 mg total) by mouth daily. 30 tablet 2   ipratropium (ATROVENT) 0.02 % nebulizer solution Take 0.25-0.5 mg by nebulization every 6 (six) hours as needed for wheezing or shortness of breath.     lactose free nutrition (BOOST) LIQD Take 237  mLs by mouth 3 (three) times daily between meals.     metoprolol succinate (TOPROL-XL) 25 MG 24 hr tablet Take 1 tablet (25 mg total) by mouth daily. 30 tablet 2   Multiple Vitamin (MULTIVITAMIN WITH MINERALS) TABS tablet Take 1 tablet by mouth every morning.     Nutritional Supplements (CARNATION BREAKFAST ESSENTIALS PO) Take 1 packet by mouth daily.     potassium chloride (KLOR-CON M) 10 MEQ tablet Take 1 tablet (10 mEq total) by mouth daily. 30 tablet 3   rivaroxaban (XARELTO) 20 MG TABS tablet Take 1 tablet (20 mg total) by mouth daily with supper. 30 tablet 2   Zinc 220 (50 Zn) MG CAPS Take 1 tablet by mouth daily.     OXYGEN 24 hrs     Pseudoeph-Bromphen-DM (BROMPHENIRAMINE-DM-PSE PO) Take 5 mLs by mouth 3 (three) times daily as needed (for coughing). (Patient not taking: Reported on 07/09/2021)     RIVAROXABAN (XARELTO) VTE STARTER PACK (15 & 20 MG) Follow package directions: Take one 15mg  tablet by mouth  twice a day. On day 22, switch to one 20mg  tablet once a day. Take with food. (Patient not taking: Reported on 07/09/2021) 51 each 0   aspirin 325 MG tablet Take 325 mg by mouth daily. (Patient not taking: Reported on 07/09/2021)     Budeson-Glycopyrrol-Formoterol (BREZTRI AEROSPHERE) 160-9-4.8 MCG/ACT AERO Inhale 2 puffs into the lungs in the morning and at bedtime. (Patient not taking: Reported on 07/09/2021) 5.9 g 0   Budeson-Glycopyrrol-Formoterol (BREZTRI AEROSPHERE) 160-9-4.8 MCG/ACT AERO Inhale 2 puffs into the lungs in the morning and at bedtime. (Patient not taking: Reported on 07/09/2021) 5.9 g 0   Budeson-Glycopyrrol-Formoterol (BREZTRI AEROSPHERE) 160-9-4.8 MCG/ACT AERO INHALE 2 PUFFS IN THE MORNING AND AT BEDTIME (Patient not taking: Reported on 07/09/2021) 22 g 1   furosemide (LASIX) 40 MG tablet Take 1 tablet (40 mg total) by mouth daily. (Patient not taking: Reported on 07/09/2021) 30 tablet 3   No facility-administered medications prior to visit.     Review of Systems:   Constitutional: No night sweats, fevers, chills. +five pound weight gain despite not eating much, suspect fluid overload; fatigue. HEENT: No headaches, difficulty swallowing, tooth/dental problems, or sore throat. No sneezing, itching, ear ache, nasal congestion, or post nasal drip CV:  +swelling in BLE, orthopnea. No chest pain, PND, anasarca, dizziness, palpitations, syncope Resp: +shortness of breath with exertion; non-productive cough. No excess mucus or change in color of mucus. No hemoptysis. No wheezing.  No chest wall deformity GI:  +anorexia. No heartburn, indigestion, abdominal pain, nausea, vomiting, diarrhea, change in bowel habits, bloody stools.  GU: No dysuria, change in color of urine, urgency or frequency.  No flank pain, no hematuria  Skin: No rash, lesions, ulcerations MSK:  No joint pain or swelling.  No decreased range of motion.  No back pain. Neuro: No dizziness or lightheadedness.  Psych: No  depression or anxiety. Mood stable.     Physical Exam:  BP (!) 124/96 (BP Location: Right Arm, Patient Position: Sitting, Cuff Size: Normal)    Pulse 90    Temp (!) 97.5 F (36.4 C) (Oral)    Ht 5' (1.524 m)    Wt 87 lb 6.4 oz (39.6 kg)    SpO2 95%    BMI 17.07 kg/m   GEN: Pleasant, interactive, chronically-ill appearing; malnourished; in no acute distress. HEENT:  Normocephalic and atraumatic. EACs patent bilaterally. TM pearly gray with present light reflex bilaterally. PERRLA. Sclera white. Nasal turbinates pink,  moist and patent bilaterally. No rhinorrhea present. Oropharynx pink and moist, without exudate or edema. No lesions, ulcerations, or postnasal drip.  NECK:  Supple w/ fair ROM. No JVD present. Normal carotid impulses w/o bruits. Thyroid symmetrical with no goiter or nodules palpated. No lymphadenopathy.   CV: RRR, no m/r/g. BLE edema, +2 pitting R, +1 pitting left. Pulses intact, +2 bilaterally. No cyanosis, pallor or clubbing. PULMONARY:  Unlabored, regular breathing. Scattered rhonchi bilaterally posteriorly. No accessory muscle use. No dullness to percussion. GI: BS present and normoactive. Soft, non-tender to palpation. No organomegaly or masses detected. No CVA tenderness. MSK: Muscle wasting. No erythema, warmth or tenderness. Cap refil <2 sec all extrem. No deformities or joint swelling noted.  Neuro: A/Ox3. No focal deficits noted.   Skin: Warm, no lesions or rashe Psych: Normal affect and behavior. Judgement and thought content appropriate.     Lab Results:  CBC    Component Value Date/Time   WBC 11.4 (H) 05/15/2021 0451   RBC 3.83 (L) 05/15/2021 0451   HGB 12.1 (L) 05/15/2021 0451   HGB 14.0 11/20/2014 1352   HCT 36.4 (L) 05/15/2021 0451   HCT 42.2 11/20/2014 1352   PLT 361 05/15/2021 0451   PLT 206 11/20/2014 1352   MCV 95.0 05/15/2021 0451   MCV 93.2 11/20/2014 1352   MCH 31.6 05/15/2021 0451   MCHC 33.2 05/15/2021 0451   RDW 14.3 05/15/2021 0451    RDW 15.2 (H) 11/20/2014 1352   LYMPHSABS 0.4 (L) 05/10/2021 2030   LYMPHSABS 1.2 11/20/2014 1352   MONOABS 0.9 05/10/2021 2030   MONOABS 0.8 11/20/2014 1352   EOSABS 0.0 05/10/2021 2030   EOSABS 0.2 11/20/2014 1352   BASOSABS 0.0 05/10/2021 2030   BASOSABS 0.1 11/20/2014 1352    BMET    Component Value Date/Time   NA 133 (L) 05/16/2021 0508   NA 137 05/11/2017 1437   NA 133 (L) 10/06/2016 1220   K 3.5 05/16/2021 0508   K 4.7 10/06/2016 1220   CL 87 (L) 05/16/2021 0508   CO2 37 (H) 05/16/2021 0508   CO2 26 10/06/2016 1220   GLUCOSE 175 (H) 05/16/2021 0508   GLUCOSE 99 10/06/2016 1220   BUN 52 (H) 05/16/2021 0508   BUN 8 05/11/2017 1437   BUN 12.0 10/06/2016 1220   CREATININE 0.90 05/16/2021 0508   CREATININE 0.87 01/16/2021 1422   CREATININE 0.8 10/06/2016 1220   CALCIUM 9.0 05/16/2021 0508   CALCIUM 10.1 10/06/2016 1220   GFRNONAA >60 05/16/2021 0508   GFRNONAA >60 01/16/2021 1422   GFRAA >60 01/03/2020 1414    BNP    Component Value Date/Time   BNP 1,941.1 (H) 05/10/2021 2030     Imaging:  No results found.    PFT Results Latest Ref Rng & Units 10/30/2014  FVC-Pre L 2.54  FVC-Predicted Pre % 74  Pre FEV1/FVC % % 49  FEV1-Pre L 1.25  FEV1-Predicted Pre % 49  DLCO uncorrected ml/min/mmHg 7.75  DLCO UN[facility]% % 35  DLVA Predicted % 43  TLC L 6.76  TLC % Predicted % 125  RV % Predicted % 217    No results found for: NITRICOXIDE  07/09/2021: EKG with sinus arhythmia. Occasional PACs. No T wave abnormalities. Qtc nl.    Assessment & Plan:   COPD with acute exacerbation ([place]) Worsening SOB likely related to fluid overload and pulm HTN. Will tx with prednisone taper given previous improvement in symptoms with pred. Continue triple therapy and PRN nebs.  Patient Instructions  -Continue Breztri 2 puffs Twice daily. Brush tongue and rinse mouth afterwards -Continue Albuterol inhaler 2 puffs or 3 mL neb every 6 hours as needed for shortness of breath or  wheezing. Notify if symptoms persist despite rescue inhaler/neb use. -Continue ipatropium 0.25-0.5 mg neb every 6 hours as needed for shortness of breath or wheezing -Continue potassium 10 mEq daily -Continue Xarelto 20 mg daily with supper. Notify of any excessive bleeding/bruising -Continue supplemental oxygen at 2-4 lpm for goal oxygen saturation >88-90%. Notify of increasing requirements  -We will await your labs from today and likely increased your daily lasix  -Prednisone taper. 4 tabs for 2 days, then 3 tabs for 2 days, 2 tabs for 2 days, then 1 tab for 2 days, then stop. Take in AM with food.  Continue frequent, high protein meals throughout the day. Add Boost or Ensure 2-3 times a day.   Chest x ray today. We will notify you of any abnormal results.   EKG today.  Congratulations on quitting smoking!! Keep up the great work.   Follow up in one week with Dr. Valeta Harms or Alanson Aly. If symptoms do not improve or worsen, please contact office for sooner follow up or seek emergency care.   Pulmonary hypertension (HCC) Worsening BLE edema and SOB. Reported oliguria. CXR today to r/o infectious etiology. Awaiting BMET and mag results and then will make dose increase to lasix if kidney function unchanged. Complaint of some chest discomfort with SOB - EKG without t wave abnormality today. Discussed with Dr. Silas Flood and given pt's poor prognosis and frailty, unlikely candidate for further tx.    Pulmonary embolism ([place]) Continue lifelong Xarelto therapy. No excessive bleeding/bruising.  NSCLC of left lung ([place]) Follow up with radiology/oncology as scheduled.   NSCLC of right lung [facility]) Recently completed SBRT and radiation oncology. Follow up with radiology/oncology as scheduled.   Chronic respiratory failure with hypoxia and hypercapnia (HCC) Stable oxygen saturations. Increased to 4 lpm with POC d/t increased SOB. Continue supplemental O2 for goal SpO2 >88-90%. Continue BiPAP  therapy at night.   Protein-calorie malnutrition, severe ([place]) Advised frequent, small high protein meals. Ensure or Boost supplements.    [name], NP 07/09/2021  Pt aware and understands NP's role.

## 2021-07-09 NOTE — Assessment & Plan Note (Signed)
Advised frequent, small high protein meals. Ensure or Boost supplements.

## 2021-07-09 NOTE — Assessment & Plan Note (Signed)
Worsening SOB likely related to fluid overload and pulm HTN. Will tx with prednisone taper given previous improvement in symptoms with pred. Continue triple therapy and PRN nebs.   Patient Instructions  -Continue Breztri 2 puffs Twice daily. Brush tongue and rinse mouth afterwards -Continue Albuterol inhaler 2 puffs or 3 mL neb every 6 hours as needed for shortness of breath or wheezing. Notify if symptoms persist despite rescue inhaler/neb use. -Continue ipatropium 0.25-0.5 mg neb every 6 hours as needed for shortness of breath or wheezing -Continue potassium 10 mEq daily -Continue Xarelto 20 mg daily with supper. Notify of any excessive bleeding/bruising -Continue supplemental oxygen at 2-4 lpm for goal oxygen saturation >88-90%. Notify of increasing requirements  -We will await your labs from today and likely increased your daily lasix  -Prednisone taper. 4 tabs for 2 days, then 3 tabs for 2 days, 2 tabs for 2 days, then 1 tab for 2 days, then stop. Take in AM with food.  Continue frequent, high protein meals throughout the day. Add Boost or Ensure 2-3 times a day.   Chest x ray today. We will notify you of any abnormal results.   EKG today.  Congratulations on quitting smoking!! Keep up the great work.   Follow up in one week with Dr. Valeta Harms or Alanson Aly. If symptoms do not improve or worsen, please contact office for sooner follow up or seek emergency care.

## 2021-07-09 NOTE — Assessment & Plan Note (Signed)
Follow up with radiology/oncology as scheduled.

## 2021-07-09 NOTE — Assessment & Plan Note (Addendum)
Continue lifelong Xarelto therapy. No excessive bleeding/bruising.

## 2021-07-10 MED ORDER — FUROSEMIDE 40 MG PO TABS: 40.0000 mg | ORAL_TABLET | Freq: Two times a day (BID) | ORAL | 0 refills | Status: DC

## 2021-07-10 MED ORDER — AMOXICILLIN-POT CLAVULANATE 875-125 MG PO TABS: 1.0000 | ORAL_TABLET | Freq: Two times a day (BID) | ORAL | 0 refills | Status: DC

## 2021-07-10 NOTE — Progress Notes (Signed)
Spoke with pt and SO, [name], regarding pt's lab results and CXR. BMET with persistent, but stable, slight hyponatremia (131) and slight elevation in BUN (35); however, this is decreased from his previous which was 52. Creatinine and GFR nl. CXR concerning for pulmonary edema vs multifocal pna with patchy airspace disease b/l and new small b/l pleural effusions. Advised patient to increase lasix to 40 mg Twice daily for a total of 80 mg daily for 5 days. Monitor BP and notify if <100/60 or if dizziness occurs. May need to remain on a higher dose diuretic given severe pulm HTN. Augmentin 875 Twice daily for 7 days. Advised to take with food in AM and take daily probiotic while on abx therapy. Follow up as scheduled on 2/7 or sooner if symptoms worsen or do not improve. ED precautions advised. Pt and [name] verbalized understanding.

## 2021-07-10 NOTE — Progress Notes (Deleted)
Spoke with pt and SO, Denita, regarding pt's lab results and CXR. BMET with persistent, but stable, slight hyponatremia (131) and slight elevation in BUN (35); however, this is decreased from his previous which was 52. Creatinine and GFR nl. CXR concerning for pulmonary edema vs multifocal pna with patchy airspace disease b/l and new small b/l pleural effusions. Advised patient to increase lasix to 40 mg Twice daily for a total of 80 mg daily for 5 days. Monitor BP and notify if <100/60 or if dizziness occurs. May need to remain on a higher dose diuretic given severe pulm HTN. Augmentin 875 Twice daily for 7 days. Advised to take with food in AM and take daily probiotic while on abx therapy. Follow up as scheduled on 2/7 or sooner if symptoms worsen or do not improve. ED precautions advised. Pt and Denita verbalized understanding.

## 2021-07-12 ENCOUNTER — Other Ambulatory Visit: Payer: Self-pay | Admitting: Adult Health

## 2021-07-12 NOTE — Telephone Encounter (Signed)
I do not have the ONO results, by Rexene Edison NP made a phone note on 06/28/21 with the results.  Please see phone note.  ONO results have already been provided to patient on 06/28/2021.  Nothing further needed.

## 2021-07-14 NOTE — Progress Notes (Signed)
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  Volumetric arc fields were employed to deliver 6 MV X-rays.  Image guidance was performed with per fraction cone beam CT prior to treatment under personal MD supervision.  Immobilization was achieved using BodyFix Pillow.  Narrative: The patient tolerated radiation treatment relatively well.     Plan: The patient has completed radiation treatment. The patient will return to radiation oncology clinic for routine followup in one month. I advised them to call or return sooner if they have any questions or concerns related to their recovery or treatment. ________________________________  Sheral Apley. Tammi Klippel, M.D.

## 2021-07-15 ENCOUNTER — Other Ambulatory Visit: Payer: Self-pay

## 2021-07-15 ENCOUNTER — Other Ambulatory Visit: Payer: Medicare Other | Admitting: Hospice

## 2021-07-15 DIAGNOSIS — Z515 Encounter for palliative care: Secondary | ICD-10-CM

## 2021-07-15 DIAGNOSIS — E43 Unspecified severe protein-calorie malnutrition: Secondary | ICD-10-CM

## 2021-07-15 DIAGNOSIS — J449 Chronic obstructive pulmonary disease, unspecified: Secondary | ICD-10-CM

## 2021-07-15 DIAGNOSIS — I509 Heart failure, unspecified: Secondary | ICD-10-CM

## 2021-07-15 NOTE — Progress Notes (Signed)
[facility] Consult Note Telephone: [phone]  Fax: [phone]  PATIENT NAME: [name] [phone] (home)  DOB: 12/12/1950 MRN: [id number]  PRIMARY CARE PROVIDER:    Janie Morning, DO,  [address] [place] [address] [phone]  REFERRING PROVIDER:   Janie Morning, [place],  [place] [address] [phone]  RESPONSIBLE PARTY:  Self/Melanie - daughter Best number to call [phone] - Denita- significant other A nurse, works for [employer]     Name Relation Home Work [place] Significant other [phone]     Unknown last name,[place] Granddaughter [phone]        TELEHEALTH VISIT STATEMENT Due to the COVID-19 crisis, this visit was done via telemedicine from my office and it was initiated and consent by this patient and or family. Video-audio (telehealth) contact was unable to be done due to technical barriers from the patients side. I connected with patient OR PROXY by a telephone  and verified that I am speaking with the correct person. I discussed the limitations of evaluation and management by telemedicine. The patient expressed understanding and agreed to proceed. Palliative Care was asked to follow this patient to address advance care planning, complex medical decision making and goals of care clarification.   Denita is with patient during visit.     ASSESSMENT AND / RECOMMENDATIONS:   CODE STATUS: . Patient is a Do Not Resuscitate  Goals of Care: Goals include to maximize quality of life and symptom management. Patient wishes to regain back his strength and be able to go outside in his yard as before. Patient/family is interested in hospice service when patient qualifies for it.   Symptom Management/Plan: Lung CA: completed 3 radiation treatments, follow up with Oncologist  to determine what next steps.  COPD: Exacerbation last week, on Prednisone taper, antibiotics completed. Continue Breztri, Albuterol, Oxygen 4 L Min. Patient has quit smoking for about 2 months. Validation provided; education reinforced on the need and benefits of not smoking. Education on deep slow breathing. Continue with flutter valve and Incentive as ordered. Followed by Pulmonologist  CHF: Lasix 40 mg BID. Elevation of BLE encouraged to promote circulation.  Protein Caloric Malnutrition: Current weight 82 Ibs per Denita.  Offer 4-6 small high nutrient/caloric meals. Continue Boost  nutritional drink BID. DASH diet discussed, encouraged to incorporate fruits and vegetables into his diet. Follow up visit with PCP as planned.  Follow up: Palliative care will continue to follow for complex medical decision making, advance care planning, and clarification of goals. Return 6 weeks or prn. Encouraged to call provider sooner with any concerns.   Family /Caregiver/Community Supports: Patient lives at home with his significant.   HOSPICE ELIGIBILITY/DIAGNOSIS: TBD  Chief Complaint: Follow up visit  HISTORY OF PRESENT ILLNESS:  [name] is a 71 y.o. year old male  with multiple medical conditions including COPD with recent exacerbation last week for which patient is continuing with prednisone taper, had completed antibiotics and continuing with breathing treatments as ordered.  Patient denies pain/discomfort, reports feeling and breathing better; endorses shortness of breath on moderate exertion.  History of lung Cancer initially in 2016, treated with radiation then, recurred again in 2019 and then again 2022. Pt has completed 3 radiation treatments, and has follow up appointment with Oncology. Patient with Protein caloric malnutrition, chronic, related to debility from lung CA, COPD, Pulmonary embolism,  CHF, Depression, HTN.  History obtained from review of EMR, discussion with primary team,  caregiver, family and/or [name].  Review and summarization of Epic records shows history from other than patient. Rest of 10 point ROS asked and negative.  I reviewed as needed, available labs, patient records, imaging, studies and related documents from the EMR.    PAST MEDICAL HISTORY:  Active Ambulatory Problems    Diagnosis Date Noted   Pancreatic mass 11/23/2014   Cancer of upper lobe of left lung ([facility]) 12/21/2014   Pneumothorax, traumatic    Pneumothorax of left lung after biopsy 01/04/2015   Former smoker 01/04/2015   Hyponatremia 01/05/2015   Hypokalemia 01/05/2015   COPD with acute exacerbation ([facility]) 01/05/2015   Essential hypertension 01/05/2015   Protein-calorie malnutrition, severe ([facility]) 01/05/2015   Primary cancer of right upper lobe of lung ([facility]) 11/30/2017   Severe claudication ([facility]) 03/28/2018   Incarcerated left inguinal hernia s/p repair 06/18/2018 06/14/2018   PAD (peripheral artery disease) ([facility])    Acute hypoxemic respiratory failure ([facility])    SBO (small bowel obstruction) ([facility])    Inguinal hernia of left side with obstruction and without gangrene    On total parenteral nutrition (TPN)    Alcoholism ([facility]) 07/26/2018   Memory loss 10/11/2018   Abnormal brain MRI 10/11/2018   Diverticulosis 01/31/2019   Internal hemorrhoids 09/17/2017   Nodule of lower lobe of left lung 01/22/2021   Lung nodules    Acute respiratory failure with hypoxia and hypercarbia ([facility]) 05/10/2021   Pulmonary embolism ([facility]) 05/11/2021   Lung cancer ([facility]) 05/11/2021   Cancer of lower lobe of left lung ([facility]) 07/04/2021   Pulmonary hypertension ([facility]) 07/09/2021   NSCLC of left lung ([facility]) 07/09/2021   NSCLC of right lung ([facility]) 07/09/2021   Chronic respiratory failure with hypoxia and hypercapnia ([facility]) 07/09/2021   Resolved Ambulatory Problems    Diagnosis Date Noted   Pneumothorax 93/79/0240   Acute metabolic encephalopathy    Past Medical History:  Diagnosis Date    Allergies    Anxiety    COPD (chronic obstructive pulmonary disease) ([facility])    Hypertension    Iliac artery stenosis, right ([facility])    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[facility])    Lower limb ischemia 03/26/2018   Lung cancer    Pancreatitis    Status post left foot surgery    Tobacco abuse     SOCIAL HX:  Social History   Tobacco Use   Smoking status: Former    Packs/day: 1.00    Years: 50.00    Pack years: 50.00    Types: Cigarettes    Quit date: 06/25/2021    Years since quitting: 0.0   Smokeless tobacco: Never  Substance Use Topics   Alcohol use: Yes    Alcohol/week: 6.0 standard drinks    Types: 6 Cans of beer per week     FAMILY HX:  Family History  Problem Relation Age of Onset   COPD Mother    CAD Mother    Diabetes Father    Cancer Brother        metastatic, unknown primary   Cancer Paternal Grandmother       ALLERGIES:  Allergies  Allergen Reactions   Prevacid [Lansoprazole] Other (See Comments)    Bloating ("became swollen with gas")   Varenicline Nausea And Vomiting and Other (See Comments)    Vivid, bad dreams also   Ciprofloxacin Other (See Comments)    GI Upset   Mirtazapine  Other (See Comments)    Negatively affected sleep   Omeprazole Other (See Comments)    "Gas"      PERTINENT MEDICATIONS:  Outpatient Encounter Medications as of 07/15/2021  Medication Sig   albuterol (PROVENTIL) (2.5 MG/3ML) 0.083% nebulizer solution Take 3 mLs (2.5 mg total) by nebulization every 2 (two) hours as needed for wheezing or shortness of breath.   albuterol (VENTOLIN HFA) 108 (90 Base) MCG/ACT inhaler Inhale 2 puffs into the lungs every 6 (six) hours as needed for shortness of breath.   amoxicillin-clavulanate (AUGMENTIN) 875-125 MG tablet Take 1 tablet by mouth 2 (two) times daily.   Ascorbic Acid (VITAMIN C PO) Take 1 tablet by mouth daily.   b complex vitamins capsule Take 1 capsule by mouth daily.   Budeson-Glycopyrrol-Formoterol (BREZTRI AEROSPHERE) 160-9-4.8 MCG/ACT  AERO Inhale 2 puffs into the lungs 2 (two) times daily.   buPROPion (WELLBUTRIN XL) 150 MG 24 hr tablet Take 150 mg by mouth every morning.    CALCIUM PO Take 1 tablet by mouth daily.   Cholecalciferol (VITAMIN D-3 PO) Take 1 capsule by mouth daily.   fluticasone (FLONASE) 50 MCG/ACT nasal spray Place 2 sprays into both nostrils daily as needed for allergies.   furosemide (LASIX) 40 MG tablet Take 1 tablet (40 mg total) by mouth daily.   furosemide (LASIX) 40 MG tablet Take 1 tablet (40 mg total) by mouth 2 (two) times daily for 5 days.   ipratropium (ATROVENT) 0.02 % nebulizer solution Take 0.25-0.5 mg by nebulization every 6 (six) hours as needed for wheezing or shortness of breath.   lactose free nutrition (BOOST) LIQD Take 237 mLs by mouth 3 (three) times daily between meals.   metoprolol succinate (TOPROL-XL) 25 MG 24 hr tablet Take 1 tablet (25 mg total) by mouth daily.   Multiple Vitamin (MULTIVITAMIN WITH MINERALS) TABS tablet Take 1 tablet by mouth every morning.   Nutritional Supplements (CARNATION BREAKFAST ESSENTIALS PO) Take 1 packet by mouth daily.   OXYGEN 24 hrs   potassium chloride (KLOR-CON M) 10 MEQ tablet Take 1 tablet (10 mEq total) by mouth daily.   predniSONE (DELTASONE) 10 MG tablet 4 tabs for 2 days, then 3 tabs for 2 days, 2 tabs for 2 days, then 1 tab for 2 days, then stop   Pseudoeph-Bromphen-DM (BROMPHENIRAMINE-DM-PSE PO) Take 5 mLs by mouth 3 (three) times daily as needed (for coughing). (Patient not taking: Reported on 07/09/2021)   XARELTO 20 MG TABS tablet Take 1 tablet by mouth once daily with supper   Zinc 220 (50 Zn) MG CAPS Take 1 tablet by mouth daily.   No facility-administered encounter medications on file as of 07/15/2021.     I spent 40 minutes providing this consultation; this includes time spent with patient/family, chart review and documentation. More than 50% of the time in this consultation was spent on counseling and coordinating communication       Thank you for the opportunity to participate in the care of Mr. Lauderback.  The palliative care team will continue to follow. Please call our office at [phone] if we can be of additional assistance.   Note: Portions of this note were generated with [employer]. Dictation errors may occur despite best attempts at proofreading.  Teodoro Spray, NP

## 2021-07-16 ENCOUNTER — Other Ambulatory Visit: Payer: Self-pay

## 2021-07-16 ENCOUNTER — Ambulatory Visit (INDEPENDENT_AMBULATORY_CARE_PROVIDER_SITE_OTHER): Payer: Medicare Other | Admitting: Nurse Practitioner

## 2021-07-16 ENCOUNTER — Encounter: Payer: Self-pay | Admitting: Nurse Practitioner

## 2021-07-16 VITALS — BP 118/72 | HR 84 | Temp 98.2°F | Ht 60.0 in | Wt 84.8 lb

## 2021-07-16 DIAGNOSIS — C3492 Malignant neoplasm of unspecified part of left bronchus or lung: Secondary | ICD-10-CM

## 2021-07-16 DIAGNOSIS — J9612 Chronic respiratory failure with hypercapnia: Secondary | ICD-10-CM

## 2021-07-16 DIAGNOSIS — J9611 Chronic respiratory failure with hypoxia: Secondary | ICD-10-CM | POA: Diagnosis not present

## 2021-07-16 DIAGNOSIS — J449 Chronic obstructive pulmonary disease, unspecified: Secondary | ICD-10-CM

## 2021-07-16 DIAGNOSIS — I272 Pulmonary hypertension, unspecified: Secondary | ICD-10-CM

## 2021-07-16 DIAGNOSIS — C3491 Malignant neoplasm of unspecified part of right bronchus or lung: Secondary | ICD-10-CM

## 2021-07-16 DIAGNOSIS — E43 Unspecified severe protein-calorie malnutrition: Secondary | ICD-10-CM

## 2021-07-16 LAB — BASIC METABOLIC PANEL
BUN: 43 mg/dL — ABNORMAL HIGH (ref 6–23)
CO2: 39 mEq/L — ABNORMAL HIGH (ref 19–32)
Calcium: 9.5 mg/dL (ref 8.4–10.5)
Chloride: 88 mEq/L — ABNORMAL LOW (ref 96–112)
Creatinine, Ser: 1.06 mg/dL (ref 0.40–1.50)
GFR: 71.42 mL/min (ref >60.00)
Glucose, Bld: 169 mg/dL — ABNORMAL HIGH (ref 70–99)
Potassium: 4.3 mEq/L (ref 3.5–5.1)
Sodium: 130 mEq/L — ABNORMAL LOW (ref 135–145)

## 2021-07-16 LAB — MAGNESIUM: Magnesium: 1.9 mg/dL (ref 1.5–2.5)

## 2021-07-16 MED ORDER — FUROSEMIDE 40 MG PO TABS: 40.0000 mg | ORAL_TABLET | Freq: Two times a day (BID) | ORAL | 2 refills | Status: AC

## 2021-07-16 NOTE — Assessment & Plan Note (Signed)
Improvement in breathing with further diuresis. Continue on triple therapy and PRN nebs.

## 2021-07-16 NOTE — Assessment & Plan Note (Signed)
Follow up with radiology/oncology as scheduled.

## 2021-07-16 NOTE — Assessment & Plan Note (Signed)
Oxygen stable. Continue on 2-4 lpm supplemental O2 for goal SpO2 >88-90%. Notify of increasing needs. Continue BiPAP nightly.

## 2021-07-16 NOTE — Progress Notes (Signed)
@Patient  ID: [name], male    DOB: [birth year], 71 y.o.   MRN: [id number]  Chief Complaint  Patient presents with   Follow-up    1 week follow up. Pt states he is feeling better since last visit. Is currently on 3L o2    Referring provider: Janie Morning, DO  HPI: 71 year old, former smoker (50 pack years) followed for COPD with emphysema, pulmonary hypertension, chronic respiratory failure, chronic pulmonary embolism.  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  He recently had a PET scan for restaging and found to have a slowly enlarging lesion within the RLL in November 2022.  He is a patient Dr. Juline Patch and last seen in office on 07/09/2021 by [facility] NP.  Past medical history significant for hypertension, PAD, protein calorie malnutrition, inguinal hernia, alcoholism.  TEST/EVENTS:  Pathology: 2016 NSCLC left, 2019 NSCLC right upper lobe 01/16/2021 CT chest: Left upper lobe subpleural lesion concerning for malignancy, slowly enlarging opacity within the left upper lobe along the major fissure line as well as the right lower lobe subpleural lesion difficult to exclude malignancy. 05/11/2019 CTA chest: Single chronic appearing pulmonary embolus in the right lower lobe pulmonary artery.  Previously seen lower lobe nodule anteriorly has decreased slightly.  Increasing solid component within the right upper lobe.  Increasing solid component within the left upper lobe with postradiation fibrosis.  Atherosclerosis. 05/11/2021 echocardiogram: EF 60 to 65%.  G3 DD.  RV mildly enlarged.  Severely elevated PA pressure PASP 64 mmHg.  LA mildly dilated.  Trivial MVR.  Moderate to severe tricuspid valve regurgitation.  05/28/2021: OV with [facility] NP for hospital follow up with dx of chronic PE. Tx with IV heparin in hospital and transitioned to Eliquis; insurance would not cover Eliquis. Discussed alternatives with pharmacy and  restarted on Xarelto. Continued on Breztri and prn albuterol. Initiated BiPAP at night. Continue supplemental O2 2-3 lpm.    07/01/2021: OV with Dr. Valeta Harms. Previously started tx and completed SBRT with radiation oncology. Increased swelling in BLE - d/c from hospital with lasix but did not have it refilled. Started on lasix 40 mg daily with 10 mEq of K replacement. Close follow up in 1 week for lab draw and APP OV. Consult with Dr. Silas Flood to discuss any possible alternative tx options for pulmn HTN.   07/09/2021: OV with Trea Latner NP.  Worsening shortness of breath and bilateral lower extremity edema.  Reported he felt like he was drowning.  Increased Lasix to 40 mg twice daily.  Discussed case with Dr. Valeta Harms and Dr. Silas Flood who agreed that he was not a candidate for further pulm HTN treatment.  Continued on Breztri with as needed albuterol and ipatropium.  Continued on Xarelto.  Continued on supplemental oxygen and nightly BiPAP.  07/16/2021: Today-follow-up Patient presents today with significant other for follow-up.  He reports feeling significantly better after increasing his Lasix to twice a day.  He has noticed that he is voiding more.  His shortness of breath has improved as well as his bilateral lower extremity edema.  He has not had any increasing oxygen requirements and has been able to return to 2 L/min.  He continues on Xarelto daily and denies any excessive bleeding or bruising.  He continues on Breztri 2 puffs twice a day.  He has decreased how often he is using his nebulizer treatments.  He denies any dizziness or hypotension, hemoptysis, chest pain,  orthopnea, PND, wheezing or cough.    Allergies  Allergen Reactions   Prevacid [Lansoprazole] Other (See Comments)    Bloating ("became swollen with gas")   Varenicline Nausea And Vomiting and Other (See Comments)    Vivid, bad dreams also   Ciprofloxacin Other (See Comments)    GI Upset   Mirtazapine Other (See Comments)    Negatively  affected sleep   Omeprazole Other (See Comments)    "Gas"    Immunization History  Administered Date(s) Administered   Fluad Quad(high Dose 70+) 03/20/2021   Influenza, High Dose Seasonal PF 04/29/2018   Influenza, Quadrivalent, Recombinant, Inj, Pf 04/13/2017, 04/18/2019, 04/18/2020, 03/25/2021   Influenza, Seasonal, Injecte, Preservative Fre 02/20/2011, 03/09/2012   Influenza,inj,Quad PF,6+ Mos 06/17/2013, 05/19/2014   Influenza-Unspecified 02/22/2015, 02/20/2016   PFIZER(Purple Top)SARS-COV-2 Vaccination 04/27/2020, 10/29/2020, 04/09/2021   Pneumococcal Conjugate-13 04/13/2017   Pneumococcal Polysaccharide-23 09/21/2013   Pneumococcal-Unspecified 10/11/2018    Past Medical History:  Diagnosis Date   Allergies    being around corn & grain causes itchy eyes, nasal congestion. He can eat these with no problems.   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, s/p SBRT; poorly differentiated NSCLC RUL 11/23/17, s/p SBRT   Pancreatitis    x2 stents placed to make patent duct to pancreas   Pulmonary hypertension (HCC)    PASP 48 mmHg 03/26/18 (echo, [facility])   Severe claudication ([place])    Status post left foot surgery    Tobacco abuse     Tobacco History: Social History   Tobacco Use  Smoking Status Former   Packs/day: 1.00   Years: 50.00   Pack years: 50.00   Types: Cigarettes   Quit date: 06/25/2021   Years since quitting: 0.0  Smokeless Tobacco Never   Counseling given: Not Answered   Outpatient Medications Prior to Visit  Medication Sig Dispense Refill   albuterol (PROVENTIL) (2.5 MG/3ML) 0.083% nebulizer solution Take 3 mLs (2.5 mg total) by nebulization every 2 (two) hours as needed for wheezing or shortness of breath. 75 mL 1   albuterol (VENTOLIN  HFA) 108 (90 Base) MCG/ACT inhaler Inhale 2 puffs into the lungs every 6 (six) hours as needed for shortness of breath. 1 each 3   amoxicillin-clavulanate (AUGMENTIN) 875-125 MG tablet Take 1 tablet by mouth 2 (two) times daily. 14 tablet 0   Ascorbic Acid (VITAMIN C PO) Take 1 tablet by mouth daily.     b complex vitamins capsule Take 1 capsule by mouth daily.     Budeson-Glycopyrrol-Formoterol (BREZTRI AEROSPHERE) 160-9-4.8 MCG/ACT AERO Inhale 2 puffs into the lungs 2 (two) times daily. 5.9 g 3   buPROPion (WELLBUTRIN XL) 150 MG 24 hr tablet Take 150 mg by mouth every morning.      CALCIUM PO Take 1 tablet by mouth daily.     Cholecalciferol (VITAMIN D-3 PO) Take 1 capsule by mouth daily.     fluticasone (FLONASE) 50 MCG/ACT nasal spray Place 2 sprays into both nostrils daily as needed for allergies.     ipratropium (ATROVENT) 0.02 % nebulizer solution Take 0.25-0.5 mg by nebulization every 6 (six) hours as needed for wheezing or shortness of breath.     lactose free nutrition (BOOST) LIQD Take 237 mLs by mouth  3 (three) times daily between meals.     metoprolol succinate (TOPROL-XL) 25 MG 24 hr tablet Take 1 tablet (25 mg total) by mouth daily. 30 tablet 2   Multiple Vitamin (MULTIVITAMIN WITH MINERALS) TABS tablet Take 1 tablet by mouth every morning.     Nutritional Supplements (CARNATION BREAKFAST ESSENTIALS PO) Take 1 packet by mouth daily.     OXYGEN 24 hrs     potassium chloride (KLOR-CON M) 10 MEQ tablet Take 1 tablet (10 mEq total) by mouth daily. 30 tablet 3   predniSONE (DELTASONE) 10 MG tablet 4 tabs for 2 days, then 3 tabs for 2 days, 2 tabs for 2 days, then 1 tab for 2 days, then stop 20 tablet 0   Pseudoeph-Bromphen-DM (BROMPHENIRAMINE-DM-PSE PO) Take 5 mLs by mouth 3 (three) times daily as needed (for coughing).     XARELTO 20 MG TABS tablet Take 1 tablet by mouth once daily with supper 30 tablet 11   Zinc 220 (50 Zn) MG CAPS Take 1 tablet by mouth daily.     furosemide (LASIX)  40 MG tablet Take 1 tablet (40 mg total) by mouth daily. 30 tablet 2   furosemide (LASIX) 40 MG tablet Take 1 tablet (40 mg total) by mouth 2 (two) times daily for 5 days. 10 tablet 0   No facility-administered medications prior to visit.     Review of Systems:   Constitutional: No weight loss or gain, night sweats, fevers, chills, fatigue, or lassitude. HEENT: No headaches, difficulty swallowing, tooth/dental problems, or sore throat. No sneezing, itching, ear ache, nasal congestion, or post nasal drip CV:  No chest pain, orthopnea, PND, swelling in lower extremities, anasarca, dizziness, palpitations, syncope Resp: +shortness of breath with exertion (significant improvement; minimal). No excess mucus or change in color of mucus. No productive or non-productive. No hemoptysis. No wheezing.  No chest wall deformity GI:  No heartburn, indigestion, abdominal pain, nausea, vomiting, diarrhea, change in bowel habits, loss of appetite, bloody stools.  GU: No dysuria, change in color of urine, urgency or frequency.  No flank pain, no hematuria  Skin: No rash, lesions, ulcerations MSK:  No joint pain or swelling.  No decreased range of motion.  No back pain. Neuro: No dizziness or lightheadedness.  Psych: No depression or anxiety. Mood stable.     Physical Exam:  BP 118/72 (BP Location: Left Arm, Patient Position: Sitting, Cuff Size: Normal)    Pulse 84    Temp 98.2 F (36.8 C) (Oral)    Ht 5' (1.524 m)    Wt 84 lb 12.8 oz (38.5 kg)    SpO2 100%    BMI 16.56 kg/m   GEN: Pleasant, interactive, chronically-ill appearing; malnourished; in no acute distress. HEENT:  Normocephalic and atraumatic. EACs patent bilaterally. TM pearly gray with present light reflex bilaterally. PERRLA. Sclera white. Nasal turbinates pink, moist and patent bilaterally. No rhinorrhea present. Oropharynx pink and moist, without exudate or edema. No lesions, ulcerations, or postnasal drip.  NECK:  Supple w/ fair ROM. No  JVD present. Normal carotid impulses w/o bruits. Thyroid symmetrical with no goiter or nodules palpated. No lymphadenopathy.   CV: RRR, no m/r/g, no peripheral edema. Pulses intact, +2 bilaterally. No cyanosis, pallor or clubbing. PULMONARY:  Unlabored, regular breathing. Clear bilaterally A&P w/o wheezes/rales/rhonchi. No accessory muscle use. No dullness to percussion. GI: BS present and normoactive. Soft, non-tender to palpation. No organomegaly or masses detected. No CVA tenderness. MSK: No erythema, warmth or tenderness. Cap refil <2  sec all extrem. No deformities or joint swelling noted. Muscle wasting Neuro: A/Ox3. No focal deficits noted.   Skin: Warm, no lesions or rashe Psych: Normal affect and behavior. Judgement and thought content appropriate.     Lab Results:  CBC    Component Value Date/Time   WBC 11.4 (H) 05/15/2021 0451   RBC 3.83 (L) 05/15/2021 0451   HGB 12.1 (L) 05/15/2021 0451   HGB 14.0 11/20/2014 1352   HCT 36.4 (L) 05/15/2021 0451   HCT 42.2 11/20/2014 1352   PLT 361 05/15/2021 0451   PLT 206 11/20/2014 1352   MCV 95.0 05/15/2021 0451   MCV 93.2 11/20/2014 1352   MCH 31.6 05/15/2021 0451   MCHC 33.2 05/15/2021 0451   RDW 14.3 05/15/2021 0451   RDW 15.2 (H) 11/20/2014 1352   LYMPHSABS 0.4 (L) 05/10/2021 2030   LYMPHSABS 1.2 11/20/2014 1352   MONOABS 0.9 05/10/2021 2030   MONOABS 0.8 11/20/2014 1352   EOSABS 0.0 05/10/2021 2030   EOSABS 0.2 11/20/2014 1352   BASOSABS 0.0 05/10/2021 2030   BASOSABS 0.1 11/20/2014 1352    BMET    Component Value Date/Time   NA 131 (L) 07/09/2021 1033   NA 137 05/11/2017 1437   NA 133 (L) 10/06/2016 1220   K 4.7 07/09/2021 1033   K 4.7 10/06/2016 1220   CL 95 (L) 07/09/2021 1033   CO2 31 07/09/2021 1033   CO2 26 10/06/2016 1220   GLUCOSE 155 (H) 07/09/2021 1033   GLUCOSE 99 10/06/2016 1220   BUN 35 (H) 07/09/2021 1033   BUN 8 05/11/2017 1437   BUN 12.0 10/06/2016 1220   CREATININE 0.81 07/09/2021 1033    CREATININE 0.87 01/16/2021 1422   CREATININE 0.8 10/06/2016 1220   CALCIUM 9.2 07/09/2021 1033   CALCIUM 10.1 10/06/2016 1220   GFRNONAA >60 05/16/2021 0508   GFRNONAA >60 01/16/2021 1422   GFRAA >60 01/03/2020 1414    BNP    Component Value Date/Time   BNP 1,941.1 (H) 05/10/2021 2030     Imaging:  DG Chest 2 View  Result Date: 07/09/2021 CLINICAL DATA:  Worsening shortness of breath EXAM: CHEST - 2 VIEW COMPARISON:  Radiograph 05/16/2021, chest CT 05/10/2021 FINDINGS: Unchanged cardiomediastinal silhouette. There is scattered lung scarring with architectural distortion and consolidation in the left upper lobe similar to prior exam. Increased patchy airspace disease bilaterally, most prominent in the right mid to lower lung, peripheral right upper lobe, and left mid lung. New small bilateral pleural effusions. No visible pneumothorax. No acute osseous abnormality. IMPRESSION: Increased patchy airspace disease bilaterally concerning for multifocal pneumonia. Small bilateral pleural effusions. Electronically Signed   By: Maurine Simmering M.D.   On: 07/09/2021 12:49      PFT Results Latest Ref Rng & Units 10/30/2014  FVC-Pre L 2.54  FVC-Predicted Pre % 74  Pre FEV1/FVC % % 49  FEV1-Pre L 1.25  FEV1-Predicted Pre % 49  DLCO uncorrected ml/min/mmHg 7.75  DLCO UNC% % 35  DLVA Predicted % 43  TLC L 6.76  TLC % Predicted % 125  RV % Predicted % 217    No results found for: NITRICOXIDE      Assessment & Plan:   Pulmonary hypertension (HCC) Improved BLE edema and SOB with increase to Twice daily dosing of lasix. Will check BMET and mag today and continue 40 mg Twice daily. Not a candidate for further tx given poor prognosis and frailty. Advised to monitor BP and notify of dizziness. Monitor weights and  for lower extremity swelling.   Patient Instructions  -Continue Breztri 2 puffs Twice daily. Brush tongue and rinse mouth afterwards -Continue Albuterol inhaler 2 puffs or 3 mL neb  every 6 hours as needed for shortness of breath or wheezing. Notify if symptoms persist despite rescue inhaler/neb use. -Continue ipatropium 0.25-0.5 mg neb every 6 hours as needed for shortness of breath or wheezing -Continue potassium 10 mEq daily -Continue Xarelto 20 mg daily with supper. Notify of any excessive bleeding/bruising -Continue supplemental oxygen at 2-4 lpm for goal oxygen saturation >88-90%. Notify of increasing requirements -Continue lasix 40 mg Twice daily. Monitor BP at home and notify if <100/60   Continue frequent, high protein meals throughout the day. Add Boost or Ensure 2-3 times a day.    Congratulations on quitting smoking!! Keep up the great work.    Follow up in two weeks with Dr. Valeta Harms or Joellen Jersey Bernardine Langworthy,NP. If symptoms do not improve or worsen, please contact office for sooner follow up or seek emergency care.   COPD, severe ([place]) Improvement in breathing with further diuresis. Continue on triple therapy and PRN nebs.   Chronic respiratory failure with hypoxia and hypercapnia (HCC) Oxygen stable. Continue on 2-4 lpm supplemental O2 for goal SpO2 >88-90%. Notify of increasing needs. Continue BiPAP nightly.   NSCLC of left lung ([place])  Follow up with radiology/oncology as scheduled.   NSCLC of right lung [facility]) Recently completed SBRT and radiation oncology. Follow up with radiology/oncology as scheduled.   Protein-calorie malnutrition, severe (HCC) Frequent, small high protein meals. Ensure or Boost supplements 3-4 times a day.     [name], NP 07/16/2021  Pt aware and understands NP's role.

## 2021-07-16 NOTE — Patient Instructions (Addendum)
-  Continue Breztri 2 puffs Twice daily. Brush tongue and rinse mouth afterwards -Continue Albuterol inhaler 2 puffs or 3 mL neb every 6 hours as needed for shortness of breath or wheezing. Notify if symptoms persist despite rescue inhaler/neb use. -Continue ipatropium 0.25-0.5 mg neb every 6 hours as needed for shortness of breath or wheezing -Continue potassium 10 mEq daily -Continue Xarelto 20 mg daily with supper. Notify of any excessive bleeding/bruising -Continue supplemental oxygen at 2-4 lpm for goal oxygen saturation >88-90%. Notify of increasing requirements -Continue lasix 40 mg Twice daily. Monitor BP at home and notify if <100/60   Continue frequent, high protein meals throughout the day. Add Boost or Ensure 2-3 times a day.    Congratulations on quitting smoking!! Keep up the great work.    Follow up in two weeks with Dr. Valeta Harms or Joellen Jersey Aristidis Talerico,NP. If symptoms do not improve or worsen, please contact office for sooner follow up or seek emergency care.

## 2021-07-16 NOTE — Assessment & Plan Note (Signed)
Frequent, small high protein meals. Ensure or Boost supplements 3-4 times a day.

## 2021-07-16 NOTE — Assessment & Plan Note (Signed)
Recently completed SBRT and radiation oncology. Follow up with radiology/oncology as scheduled.

## 2021-07-16 NOTE — Assessment & Plan Note (Signed)
Improved BLE edema and SOB with increase to Twice daily dosing of lasix. Will check BMET and mag today and continue 40 mg Twice daily. Not a candidate for further tx given poor prognosis and frailty. Advised to monitor BP and notify of dizziness. Monitor weights and for lower extremity swelling.   Patient Instructions  -Continue Breztri 2 puffs Twice daily. Brush tongue and rinse mouth afterwards -Continue Albuterol inhaler 2 puffs or 3 mL neb every 6 hours as needed for shortness of breath or wheezing. Notify if symptoms persist despite rescue inhaler/neb use. -Continue ipatropium 0.25-0.5 mg neb every 6 hours as needed for shortness of breath or wheezing -Continue potassium 10 mEq daily -Continue Xarelto 20 mg daily with supper. Notify of any excessive bleeding/bruising -Continue supplemental oxygen at 2-4 lpm for goal oxygen saturation >88-90%. Notify of increasing requirements -Continue lasix 40 mg Twice daily. Monitor BP at home and notify if <100/60   Continue frequent, high protein meals throughout the day. Add Boost or Ensure 2-3 times a day.    Congratulations on quitting smoking!! Keep up the great work.    Follow up in two weeks with Dr. Valeta King or Scott Jersey Qiana Landgrebe,NP. If symptoms do not improve or worsen, please contact office for sooner follow up or seek emergency care.

## 2021-07-17 ENCOUNTER — Ambulatory Visit (HOSPITAL_COMMUNITY)
Admission: RE | Admit: 2021-07-17 | Discharge: 2021-07-17 | Disposition: A | Discharge status: Home / Self Care | Payer: Medicare Other | Source: Ambulatory Visit | Attending: Urology | Admitting: Urology

## 2021-07-17 DIAGNOSIS — C3411 Malignant neoplasm of upper lobe, right bronchus or lung: Secondary | ICD-10-CM | POA: Diagnosis present

## 2021-07-17 DIAGNOSIS — C3432 Malignant neoplasm of lower lobe, left bronchus or lung: Secondary | ICD-10-CM | POA: Diagnosis present

## 2021-07-17 MED ORDER — IOHEXOL 300 MG/ML  SOLN
75.0000 mL | Freq: Once | INTRAMUSCULAR | Status: AC | PRN
  Administered 2021-07-17: 75 mL via INTRAVENOUS

## 2021-07-17 MED ORDER — SODIUM CHLORIDE (PF) 0.9 % IJ SOLN
INTRAMUSCULAR | Status: AC
  Filled 2021-07-17: qty 50

## 2021-07-17 NOTE — Progress Notes (Signed)
BMET with hyponatremia, stable compared to prior. CO2 slightly elevated, consistent with COPD. BUN elevated, stable compared to prior. Creatinine, GFR and mag nl.

## 2021-07-18 ENCOUNTER — Other Ambulatory Visit (HOSPITAL_COMMUNITY): Payer: Medicaid Other

## 2021-07-24 ENCOUNTER — Ambulatory Visit: Payer: Self-pay | Admitting: Radiation Oncology

## 2021-07-30 ENCOUNTER — Encounter: Payer: Self-pay | Admitting: Nurse Practitioner

## 2021-07-30 ENCOUNTER — Ambulatory Visit (INDEPENDENT_AMBULATORY_CARE_PROVIDER_SITE_OTHER): Payer: Medicare Other | Admitting: Nurse Practitioner

## 2021-07-30 ENCOUNTER — Ambulatory Visit: Payer: Medicaid Other | Admitting: Adult Health

## 2021-07-30 ENCOUNTER — Other Ambulatory Visit: Payer: Self-pay

## 2021-07-30 VITALS — BP 128/78 | HR 91 | Temp 97.6°F | Ht 60.0 in | Wt 80.6 lb

## 2021-07-30 DIAGNOSIS — J9611 Chronic respiratory failure with hypoxia: Secondary | ICD-10-CM

## 2021-07-30 DIAGNOSIS — C3492 Malignant neoplasm of unspecified part of left bronchus or lung: Secondary | ICD-10-CM | POA: Diagnosis not present

## 2021-07-30 DIAGNOSIS — J9612 Chronic respiratory failure with hypercapnia: Secondary | ICD-10-CM

## 2021-07-30 DIAGNOSIS — I272 Pulmonary hypertension, unspecified: Secondary | ICD-10-CM | POA: Diagnosis not present

## 2021-07-30 DIAGNOSIS — C3491 Malignant neoplasm of unspecified part of right bronchus or lung: Secondary | ICD-10-CM

## 2021-07-30 DIAGNOSIS — E43 Unspecified severe protein-calorie malnutrition: Secondary | ICD-10-CM

## 2021-07-30 DIAGNOSIS — J449 Chronic obstructive pulmonary disease, unspecified: Secondary | ICD-10-CM

## 2021-07-30 NOTE — Assessment & Plan Note (Signed)
Follow-up with oncology as scheduled.

## 2021-07-30 NOTE — Assessment & Plan Note (Signed)
Severe disease.  Continues to have minimal shortness of breath upon exertion which is at baseline.  We will continue on triple therapy with as needed nebs.

## 2021-07-30 NOTE — Assessment & Plan Note (Signed)
Frequent, small high protein meals. Ensure or Boost supplements 3-4 times a day.

## 2021-07-30 NOTE — Assessment & Plan Note (Addendum)
Appears compensated on increased dose of Lasix.  Will continue 40 twice a day.  BMET previously stable-plans to recheck at PCP appointment next week.  Discussed return precautions and to notify if any worsening swelling or shortness of breath occurs.  Patient Instructions  -Continue Breztri 2 puffs Twice daily. Brush tongue and rinse mouth afterwards -Continue Albuterol inhaler 2 puffs or 3 mL neb every 6 hours as needed for shortness of breath or wheezing. Notify if symptoms persist despite rescue inhaler/neb use. -Continue ipatropium 0.25-0.5 mg neb every 6 hours as needed for shortness of breath or wheezing -Continue potassium 10 mEq daily -Continue Xarelto 20 mg daily with supper. Notify of any excessive bleeding/bruising -Continue supplemental oxygen at 2-4 lpm for goal oxygen saturation >88-90%. Notify of increasing requirements -Continue lasix 40 mg Twice daily. Monitor BP at home and notify if <100/60. Can fax labs from PCP to [phone]   Continue frequent, high protein meals throughout the day. Add Boost or Ensure 2-3 times a day.    Congratulations on quitting smoking!! Keep up the great work.    Follow up in 4-6 weeks with Dr. Valeta Harms. If symptoms do not improve or worsen, please contact office for sooner follow up or seek emergency care.

## 2021-07-30 NOTE — Assessment & Plan Note (Signed)
Recently completed SBRT and radiation oncology. Follow up with radiology/oncology as scheduled.

## 2021-07-30 NOTE — Assessment & Plan Note (Signed)
Oxygen stable. Continue on 2-4 lpm supplemental O2 for goal SpO2 >88-90%. Notify of increasing needs. Continue BiPAP nightly.

## 2021-07-30 NOTE — Patient Instructions (Addendum)
-  Continue Breztri 2 puffs Twice daily. Brush tongue and rinse mouth afterwards -Continue Albuterol inhaler 2 puffs or 3 mL neb every 6 hours as needed for shortness of breath or wheezing. Notify if symptoms persist despite rescue inhaler/neb use. -Continue ipatropium 0.25-0.5 mg neb every 6 hours as needed for shortness of breath or wheezing -Continue potassium 10 mEq daily -Continue Xarelto 20 mg daily with supper. Notify of any excessive bleeding/bruising -Continue supplemental oxygen at 2-4 lpm for goal oxygen saturation >88-90%. Notify of increasing requirements -Continue lasix 40 mg Twice daily. Monitor BP at home and notify if <100/60. Can fax labs from PCP to [phone]   Continue frequent, high protein meals throughout the day. Add Boost or Ensure 2-3 times a day.    Congratulations on quitting smoking!! Keep up the great work.    Follow up in 4-6 weeks with Dr. Valeta Harms. If symptoms do not improve or worsen, please contact office for sooner follow up or seek emergency care.

## 2021-07-30 NOTE — Progress Notes (Signed)
@Patient  ID: [name], male    DOB: [birth year], 71 y.o.   MRN: [id number]  Chief Complaint  Patient presents with   Follow-up    Feeling better sob better, occass. cough    Referring provider: Janie Morning, DO  HPI: 71 year old, former smoker (50 pack years) followed for COPD with emphysema, pulmonary hypertension, chronic respiratory failure, chronic pulmonary embolism.  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  He recently had a PET scan for restaging and found to have a slowly enlarging lesion within the right lower lobe in November 2022.  He is a patient Dr. Juline Patch and last seen in office on 07/16/2021 by [facility] NP.  Past medical history significant for hypertension, PAD, protein calorie malnutrition, inguinal hernia, alcoholism.  TEST/EVENTS:  Pathology: 2016 NSCLC left, 2019 NSCLC right upper lobe 01/16/2021 CT chest: Left upper lobe subpleural lesion concerning for malignancy, slowly enlarging opacity within the left upper lobe along the major fissure line as well as the right lower lobe subpleural lesion difficult to exclude malignancy. 05/11/2019 CTA chest: Single chronic appearing pulmonary embolus in the right lower lobe pulmonary artery.  Previously seen lower lobe nodule anteriorly has decreased slightly.  Increasing solid component within the right upper lobe.  Increasing solid component within the left upper lobe with postradiation fibrosis.  Atherosclerosis. 05/11/2021 echocardiogram: EF 60 to 65%.  G3 DD.  RV mildly enlarged.  Severely elevated PA pressure PASP 64 mmHg.  LA mildly dilated.  Trivial MVR.  Moderate to severe tricuspid valve regurgitation.  05/28/2021: OV with [place] NP for hospital follow up with dx of chronic PE. Tx with IV heparin in hospital and transitioned to Eliquis; insurance would not cover Eliquis. Discussed alternatives with pharmacy and restarted on Xarelto. Continued on  Breztri and prn albuterol. Initiated BiPAP at night. Continue supplemental O2 2-3 lpm.    07/01/2021: OV with Dr. Valeta Harms. Previously started tx and completed SBRT with radiation oncology. Increased swelling in BLE - d/c from hospital with lasix but did not have it refilled. Started on lasix 40 mg daily with 10 mEq of K replacement. Close follow up in 1 week for lab draw and APP OV. Consult with Dr. Silas Flood to discuss any possible alternative tx options for pulmn HTN.    07/09/2021: OV with Marikay Roads NP.  Worsening shortness of breath and bilateral lower extremity edema.  Reported he felt like he was drowning.  Increased Lasix to 40 mg twice daily.  Discussed case with Dr. Valeta Harms and Dr. Silas Flood who agreed that he was not a candidate for further pulm HTN treatment.  Continued on Breztri with as needed albuterol and ipatropium.  Continued on Xarelto.  Continued on supplemental oxygen and nightly BiPAP.  07/16/2021: OV with Honestie Kulik NP.  Significant increase in breathing and bilateral lower extremity edema with increase in Lasix to 40 mg twice a day.  He is not a candidate for further pulmonary hypertension treatment.  Continued on Lasix 40 mg twice daily -Bumex stable.  Continued on triple therapy with Breztri.  As needed albuterol.  Continued supplemental O2 2 to 4 L/min.  07/30/2021: Today-follow-up Patient presents today with wife for follow-up.  He reports that his breathing has been stable and he has not had any worsening shortness of breath, cough or increased swelling in his lower extremities.  He feels well on the increased dose of Lasix.  He is voiding frequently without complications.  He has been on 2 L/min nasal cannula supplemental O2 without any increasing requirements.  Oxygen saturations have maintained above 88 to 90% at home.  He continues on [place] twice a day.  He has not needed his albuterol or nebulizers much.  He is scheduled to have labs and follow-up with his PCP next week.  He has been stable  on his Xarelto and has not noticed any excessive bleeding or bruising.  Allergies  Allergen Reactions   Prevacid [Lansoprazole] Other (See Comments)    Bloating ("became swollen with gas")   Varenicline Nausea And Vomiting and Other (See Comments)    Vivid, bad dreams also   Ciprofloxacin Other (See Comments)    GI Upset   Mirtazapine Other (See Comments)    Negatively affected sleep   Omeprazole Other (See Comments)    "Gas"    Immunization History  Administered Date(s) Administered   Fluad Quad(high Dose 65+) 03/20/2021   Influenza, High Dose Seasonal PF 04/29/2018   Influenza, Quadrivalent, Recombinant, Inj, Pf 04/13/2017, 04/18/2019, 04/18/2020, 03/25/2021   Influenza, Seasonal, Injecte, Preservative Fre 02/20/2011, 03/09/2012   Influenza,inj,Quad PF,6+ Mos 06/17/2013, 05/19/2014   Influenza-Unspecified 02/22/2015, 02/20/2016   PFIZER(Purple Top)SARS-COV-2 Vaccination 04/27/2020, 10/29/2020, 04/09/2021   Pneumococcal Conjugate-13 04/13/2017   Pneumococcal Polysaccharide-23 09/21/2013   Pneumococcal-Unspecified 10/11/2018    Past Medical History:  Diagnosis Date   Allergies    being around corn & grain causes itchy eyes, nasal congestion. He can eat these with no problems.   Anxiety    COPD (chronic obstructive pulmonary disease) (HCC)    Hypertension    Iliac artery stenosis, right (HCC)    80% Stenosis   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[place])    Lower limb ischemia 03/26/2018   Lung cancer    LUL adenocarcinoma 12/28/14, s/p SBRT; poorly differentiated NSCLC RUL 11/23/17, s/p SBRT   Pancreatitis    x2 stents placed to make patent duct to pancreas   Pulmonary hypertension (HCC)    PASP 48 mmHg 03/26/18 (echo, [facility])   Severe claudication ([place])    Status post left foot surgery    Tobacco abuse     Tobacco History: Social History   Tobacco Use  Smoking Status Some Days   Packs/day: 1.00   Years: 50.00   Pack years: 50.00   Types: Cigarettes   Last  attempt to quit: 06/25/2021   Years since quitting: 0.0  Smokeless Tobacco Never   Ready to quit: Not Answered Counseling given: Not Answered   Outpatient Medications Prior to Visit  Medication Sig Dispense Refill   albuterol (PROVENTIL) (2.5 MG/3ML) 0.083% nebulizer solution Take 3 mLs (2.5 mg total) by nebulization every 2 (two) hours as needed for wheezing or shortness of breath. 75 mL 1   albuterol (VENTOLIN HFA) 108 (90 Base) MCG/ACT inhaler Inhale 2 puffs into the lungs every 6 (six) hours as needed for shortness of breath. 1 each 3   b complex vitamins capsule Take 1 capsule by mouth daily.     Budeson-Glycopyrrol-Formoterol (BREZTRI AEROSPHERE) 160-9-4.8 MCG/ACT AERO Inhale 2 puffs into the lungs 2 (two) times daily. 5.9 g 3   buPROPion (WELLBUTRIN XL) 150 MG 24 hr tablet Take 150 mg by mouth every morning.      CALCIUM PO Take 1 tablet by mouth daily.     Cholecalciferol (VITAMIN D-3 PO) Take 1 capsule by mouth daily.     fluticasone (FLONASE) 50 MCG/ACT nasal spray Place 2 sprays into both nostrils daily as needed for allergies.  furosemide (LASIX) 40 MG tablet Take 1 tablet (40 mg total) by mouth 2 (two) times daily. 60 tablet 2   ipratropium (ATROVENT) 0.02 % nebulizer solution Take 0.25-0.5 mg by nebulization every 6 (six) hours as needed for wheezing or shortness of breath.     lactose free nutrition (BOOST) LIQD Take 237 mLs by mouth 3 (three) times daily between meals.     metoprolol succinate (TOPROL-XL) 25 MG 24 hr tablet Take 1 tablet (25 mg total) by mouth daily. 30 tablet 2   Nutritional Supplements (CARNATION BREAKFAST ESSENTIALS PO) Take 1 packet by mouth daily.     OXYGEN 24 hrs     potassium chloride (KLOR-CON M) 10 MEQ tablet Take 1 tablet (10 mEq total) by mouth daily. 30 tablet 3   Pseudoeph-Bromphen-DM (BROMPHENIRAMINE-DM-PSE PO) Take 5 mLs by mouth 3 (three) times daily as needed (for coughing).     XARELTO 20 MG TABS tablet Take 1 tablet by mouth once daily  with supper 30 tablet 11   amoxicillin-clavulanate (AUGMENTIN) 875-125 MG tablet Take 1 tablet by mouth 2 (two) times daily. (Patient not taking: Reported on 07/30/2021) 14 tablet 0   Ascorbic Acid (VITAMIN C PO) Take 1 tablet by mouth daily. (Patient not taking: Reported on 07/30/2021)     Multiple Vitamin (MULTIVITAMIN WITH MINERALS) TABS tablet Take 1 tablet by mouth every morning. (Patient not taking: Reported on 07/30/2021)     predniSONE (DELTASONE) 10 MG tablet 4 tabs for 2 days, then 3 tabs for 2 days, 2 tabs for 2 days, then 1 tab for 2 days, then stop (Patient not taking: Reported on 07/30/2021) 20 tablet 0   Zinc 220 (50 Zn) MG CAPS Take 1 tablet by mouth daily. (Patient not taking: Reported on 07/30/2021)     No facility-administered medications prior to visit.     Review of Systems:   Constitutional: No weight loss or gain, night sweats, fevers, chills, fatigue, or lassitude. HEENT: No headaches, difficulty swallowing, tooth/dental problems, or sore throat. No sneezing, itching, ear ache, nasal congestion, or post nasal drip CV:  No chest pain, orthopnea, PND, swelling in lower extremities, anasarca, dizziness, palpitations, syncope Resp: +minimal shortness of breath with exertion (stable, at baseline). No excess mucus or change in color of mucus. No productive or non-productive. No hemoptysis. No wheezing.  No chest wall deformity GI:  No heartburn, indigestion, abdominal pain, nausea, vomiting, diarrhea, change in bowel habits, loss of appetite, bloody stools.  GU: No dysuria, change in color of urine, urgency or frequency.  No flank pain, no hematuria  Skin: No rash, lesions, ulcerations MSK:  No joint pain or swelling.  No decreased range of motion.  No back pain. Neuro: No dizziness or lightheadedness.  Psych: No depression or anxiety. Mood stable.     Physical Exam:  BP 128/78 (BP Location: Left Arm, Cuff Size: Normal)    Pulse 91    Temp 97.6 F (36.4 C) (Temporal)    Ht  5' (1.524 m)    Wt 80 lb 9.6 oz (36.6 kg)    SpO2 94%    BMI 15.74 kg/m   GEN: Pleasant, chronically-ill appearing, malnourished; in no acute distress HEENT:  Normocephalic and atraumatic. EACs patent bilaterally. TM pearly gray with present light reflex bilaterally. PERRLA. Sclera white. Nasal turbinates pink, moist and patent bilaterally. No rhinorrhea present. Oropharynx pink and moist, without exudate or edema. No lesions, ulcerations, or postnasal drip.  NECK:  Supple w/ fair ROM. No JVD present. Normal  carotid impulses w/o bruits. Thyroid symmetrical with no goiter or nodules palpated. No lymphadenopathy.   CV: RRR, no m/r/g, no peripheral edema. Pulses intact, +2 bilaterally. No cyanosis, pallor or clubbing. PULMONARY:  Unlabored, regular breathing. Diminished bases bilaterally; otherwise clear. No accessory muscle use. No dullness to percussion. GI: BS present and normoactive. Soft, non-tender to palpation. No organomegaly or masses detected. No CVA tenderness. MSK: No erythema, warmth or tenderness. Cap refil <2 sec all extrem. No deformities or joint swelling noted. Muscle wasting Neuro: A/Ox3. No focal deficits noted.   Skin: Warm, no lesions or rashe Psych: Normal affect and behavior. Judgement and thought content appropriate.     Lab Results:  CBC    Component Value Date/Time   WBC 11.4 (H) 05/15/2021 0451   RBC 3.83 (L) 05/15/2021 0451   HGB 12.1 (L) 05/15/2021 0451   HGB 14.0 11/20/2014 1352   HCT 36.4 (L) 05/15/2021 0451   HCT 42.2 11/20/2014 1352   PLT 361 05/15/2021 0451   PLT 206 11/20/2014 1352   MCV 95.0 05/15/2021 0451   MCV 93.2 11/20/2014 1352   MCH 31.6 05/15/2021 0451   MCHC 33.2 05/15/2021 0451   RDW 14.3 05/15/2021 0451   RDW 15.2 (H) 11/20/2014 1352   LYMPHSABS 0.4 (L) 05/10/2021 2030   LYMPHSABS 1.2 11/20/2014 1352   MONOABS 0.9 05/10/2021 2030   MONOABS 0.8 11/20/2014 1352   EOSABS 0.0 05/10/2021 2030   EOSABS 0.2 11/20/2014 1352   BASOSABS 0.0  05/10/2021 2030   BASOSABS 0.1 11/20/2014 1352    BMET    Component Value Date/Time   NA 130 (L) 07/16/2021 1050   NA 137 05/11/2017 1437   NA 133 (L) 10/06/2016 1220   K 4.3 07/16/2021 1050   K 4.7 10/06/2016 1220   CL 88 (L) 07/16/2021 1050   CO2 39 (H) 07/16/2021 1050   CO2 26 10/06/2016 1220   GLUCOSE 169 (H) 07/16/2021 1050   GLUCOSE 99 10/06/2016 1220   BUN 43 (H) 07/16/2021 1050   BUN 8 05/11/2017 1437   BUN 12.0 10/06/2016 1220   CREATININE 1.06 07/16/2021 1050   CREATININE 0.87 01/16/2021 1422   CREATININE 0.8 10/06/2016 1220   CALCIUM 9.5 07/16/2021 1050   CALCIUM 10.1 10/06/2016 1220   GFRNONAA >60 05/16/2021 0508   GFRNONAA >60 01/16/2021 1422   GFRAA >60 01/03/2020 1414    BNP    Component Value Date/Time   BNP 1,941.1 (H) 05/10/2021 2030     Imaging:  DG Chest 2 View  Result Date: 07/09/2021 CLINICAL DATA:  Worsening shortness of breath EXAM: CHEST - 2 VIEW COMPARISON:  Radiograph 05/16/2021, chest CT 05/10/2021 FINDINGS: Unchanged cardiomediastinal silhouette. There is scattered lung scarring with architectural distortion and consolidation in the left upper lobe similar to prior exam. Increased patchy airspace disease bilaterally, most prominent in the right mid to lower lung, peripheral right upper lobe, and left mid lung. New small bilateral pleural effusions. No visible pneumothorax. No acute osseous abnormality. IMPRESSION: Increased patchy airspace disease bilaterally concerning for multifocal pneumonia. Small bilateral pleural effusions. Electronically Signed   By: Maurine Simmering M.D.   On: 07/09/2021 12:49   CT Chest W Contrast  Result Date: 07/19/2021 CLINICAL DATA:  71 year old male with history of non-small cell lung cancer. Evaluate for treatment response. EXAM: CT CHEST WITH CONTRAST TECHNIQUE: Multidetector CT imaging of the chest was performed during intravenous contrast administration. RADIATION DOSE REDUCTION: This exam was performed according  to the departmental dose-optimization program which  includes automated exposure control, adjustment of the mA and/or kV according to patient size and/or use of iterative reconstruction technique. CONTRAST:  42mL OMNIPAQUE IOHEXOL 300 MG/ML  SOLN COMPARISON:  Multiple priors, most recently 05/10/2021. FINDINGS: Cardiovascular: Heart size is mildly enlarged with biatrial dilatation. There is no significant pericardial fluid, thickening or pericardial calcification. There is aortic atherosclerosis, as well as atherosclerosis of the great vessels of the mediastinum and the coronary arteries, including calcified atherosclerotic plaque in the left main, left anterior descending, left circumflex and right coronary arteries. Mediastinum/Nodes: No pathologically enlarged mediastinal or hilar lymph nodes. Esophagus is unremarkable in appearance. No axillary lymphadenopathy. Lungs/Pleura: In the posterior aspect of the left upper lobe abutting the major fissure (axial image 47 of series 7) there is a 3.3 x 1.4 cm mass-like area of architectural distortion which demonstrates a clear pattern of growth and increasingly solid appearance when compared to prior examinations dating back to 2021, indicative of local recurrence of malignancy at the site of the previously treated left upper lobe pulmonary nodule. Mild architectural distortion in the periphery of the right upper lobe (axial image 11 of series 7) at site of previously radiated right upper lobe pulmonary nodule is stable. Multiple other areas of nodularity are scattered throughout the lungs bilaterally, some of which are similar to prior studies, while others are new. In the posterior aspect of the right upper lobe abutting the major fissure (axial image 42 of series 7) there is an 11 x 6 mm nodule which appears similar to the most recent prior examination. New right lower lobe pulmonary nodule (axial image 65 of series 7) measuring 8 x 6 mm, as well as adjacent areas of  nodularity and septal thickening, nonspecific, potentially infectious or inflammatory in etiology. Several other similar appearing areas of nodular architectural distortion are also noted elsewhere in the lungs, most notably in the periphery of the left lower lobe (axial image 90 of series 7) near a fiducial marker where a nodule measures 1.7 x 0.9 cm, similar to the prior study (although prior examination was limited by motion in this region). Diffuse bronchial wall thickening with moderate centrilobular and paraseptal emphysema. Upper Abdomen: Severe pancreatic atrophy with diffuse pancreatic ductal dilatation and widespread coarse calcifications throughout the visualized pancreatic parenchyma, indicative of chronic pancreatitis, similar to prior studies. Aortic atherosclerosis. Musculoskeletal: Several old healed left-sided rib fractures are incidentally noted. There are no aggressive appearing lytic or blastic lesions noted in the visualized portions of the skeleton. IMPRESSION: 1. Multiple pulmonary nodules are again noted throughout the lungs bilaterally, the majority of which are likely of infectious or inflammatory etiology. However, there has been a very clear progression of disease in the posterior aspect of the left upper lobe at the site of the previously treated neoplasm, compatible with local recurrence of disease. Further evaluation with PET-CT is recommended in the near future to better evaluate this finding. 2. Diffuse bronchial wall thickening with moderate centrilobular and paraseptal emphysema; imaging findings suggestive of underlying COPD. 3. Aortic atherosclerosis, in addition to left main and three-vessel coronary artery disease. Please note that although the presence of coronary artery calcium documents the presence of coronary artery disease, the severity of this disease and any potential stenosis cannot be assessed on this non-gated CT examination. Assessment for potential risk factor  modification, dietary therapy or pharmacologic therapy may be warranted, if clinically indicated. 4. Cardiomegaly with biatrial dilatation. 5. Chronic pancreatitis redemonstrated. Aortic Atherosclerosis (ICD10-I70.0) and Emphysema (ICD10-J43.9). Electronically Signed   By:  Vinnie Langton M.D.   On: 07/19/2021 08:19      PFT Results Latest Ref Rng & Units 10/30/2014  FVC-Pre L 2.54  FVC-Predicted Pre % 74  Pre FEV1/FVC % % 49  FEV1-Pre L 1.25  FEV1-Predicted Pre % 49  DLCO uncorrected ml/min/mmHg 7.75  DLCO UNC% % 35  DLVA Predicted % 43  TLC L 6.76  TLC % Predicted % 125  RV % Predicted % 217    No results found for: NITRICOXIDE      Assessment & Plan:   Pulmonary hypertension ([place]) Appears compensated on increased dose of Lasix.  Will continue 40 twice a day.  BMET previously stable-plans to recheck at PCP appointment next week.  Discussed return precautions and to notify if any worsening swelling or shortness of breath occurs.  Patient Instructions  -Continue Breztri 2 puffs Twice daily. Brush tongue and rinse mouth afterwards -Continue Albuterol inhaler 2 puffs or 3 mL neb every 6 hours as needed for shortness of breath or wheezing. Notify if symptoms persist despite rescue inhaler/neb use. -Continue ipatropium 0.25-0.5 mg neb every 6 hours as needed for shortness of breath or wheezing -Continue potassium 10 mEq daily -Continue Xarelto 20 mg daily with supper. Notify of any excessive bleeding/bruising -Continue supplemental oxygen at 2-4 lpm for goal oxygen saturation >88-90%. Notify of increasing requirements -Continue lasix 40 mg Twice daily. Monitor BP at home and notify if <100/60. Can fax labs from PCP to [phone]   Continue frequent, high protein meals throughout the day. Add Boost or Ensure 2-3 times a day.    Congratulations on quitting smoking!! Keep up the great work.    Follow up in 4-6 weeks with Dr. Valeta Harms. If symptoms do not improve or worsen, please  contact office for sooner follow up or seek emergency care.   COPD, severe ([place]) Severe disease.  Continues to have minimal shortness of breath upon exertion which is at baseline.  We will continue on triple therapy with as needed nebs.  NSCLC of left lung [facility]) Follow-up with oncology as scheduled.  NSCLC of right lung  [facility]) Recently completed SBRT and radiation oncology. Follow up with radiology/oncology as scheduled.   Chronic respiratory failure with hypoxia and hypercapnia (HCC) Oxygen stable. Continue on 2-4 lpm supplemental O2 for goal SpO2 >88-90%. Notify of increasing needs. Continue BiPAP nightly.     Protein-calorie malnutrition, severe (HCC) Frequent, small high protein meals. Ensure or Boost supplements 3-4 times a day.   [name], NP 07/30/2021  Pt aware and understands NP's role.

## 2021-08-06 ENCOUNTER — Other Ambulatory Visit: Payer: Self-pay

## 2021-08-06 ENCOUNTER — Ambulatory Visit
Admission: RE | Admit: 2021-08-06 | Discharge: 2021-08-06 | Disposition: A | Discharge status: Home / Self Care | Payer: Medicare Other | Source: Ambulatory Visit | Attending: Radiation Oncology | Admitting: Radiation Oncology

## 2021-08-06 DIAGNOSIS — C3411 Malignant neoplasm of upper lobe, right bronchus or lung: Secondary | ICD-10-CM | POA: Insufficient documentation

## 2021-08-06 NOTE — Progress Notes (Signed)
°  Radiation Oncology         [phone] ________________________________  Name: [name] MRN: [id number]  Date: 08/06/2021  DOB: [birth year]  STEREOTACTIC BODY RADIOTHERAPY SIMULATION AND TREATMENT PLANNING NOTE    ICD-10-CM   1. Primary cancer of right upper lobe of lung (HCC)  C34.11       DIAGNOSIS:  71 yo male with Stage IA, NSCLC in the right upper lobe of the lung  NARRATIVE:  The patient was brought to the [facility].  Identity was confirmed.  All relevant records and images related to the planned course of therapy were reviewed.  The patient freely provided informed written consent to proceed with treatment after reviewing the details related to the planned course of therapy. The consent form was witnessed and verified by the simulation staff.  Then, the patient was set-up in a stable reproducible  supine position for radiation therapy.  A BodyFix immobilization pillow was fabricated for reproducible positioning.  Then I personally applied the abdominal compression paddle to limit respiratory excursion.  4D respiratoy motion management CT images were obtained.  Surface markings were placed.  The CT images were loaded into the planning software.  Then, using Cine, MIP, and standard views, the internal target volume (ITV) and planning target volumes (PTV) were delinieated, and avoidance structures were contoured.  Treatment planning then occurred.  The radiation prescription was entered and confirmed.  A total of two complex treatment devices were fabricated in the form of the BodyFix immobilization pillow and a neck accuform cushion.  I have requested : 3D Simulation  I have requested a DVH of the following structures: Heart, Lungs, Esophagus, Chest Wall, Brachial Plexus, Major Blood Vessels, and targets.  SPECIAL TREATMENT PROCEDURE:  The planned course of therapy using radiation constitutes a special treatment procedure. Special care is required in the management  of this patient for the following reasons. This treatment constitutes a Special Treatment Procedure for the following reason: [ High dose per fraction requiring special monitoring for increased toxicities of treatment including daily imaging..  The special nature of the planned course of radiotherapy will require increased physician supervision and oversight to ensure patient's safety with optimal treatment outcomes.  This requires extended time and effort.    RESPIRATORY MOTION MANAGEMENT SIMULATION:  In order to account for effect of respiratory motion on target structures and other organs in the planning and delivery of radiotherapy, this patient underwent respiratory motion management simulation.  To accomplish this, when the patient was brought to the CT simulation planning suite, 4D respiratoy motion management CT images were obtained.  The CT images were loaded into the planning software.  Then, using a variety of tools including Cine, MIP, and standard views, the target volume and planning target volumes (PTV) were delineated.  Avoidance structures were contoured.  Treatment planning then occurred.  Dose volume histograms were generated and reviewed for each of the requested structure.  The resulting plan was carefully reviewed and approved today.  PLAN:  The patient will receive 54 Gy in 3 fractions.  ________________________________  Sheral Apley Tammi Klippel, M.D.

## 2021-08-08 ENCOUNTER — Ambulatory Visit: Payer: Medicaid Other | Admitting: Urology

## 2021-08-12 ENCOUNTER — Other Ambulatory Visit: Payer: Self-pay

## 2021-08-12 ENCOUNTER — Other Ambulatory Visit: Payer: Medicare Other | Admitting: Hospice

## 2021-08-12 DIAGNOSIS — J449 Chronic obstructive pulmonary disease, unspecified: Secondary | ICD-10-CM

## 2021-08-12 DIAGNOSIS — Z510 Encounter for antineoplastic radiation therapy: Secondary | ICD-10-CM | POA: Insufficient documentation

## 2021-08-12 DIAGNOSIS — C3411 Malignant neoplasm of upper lobe, right bronchus or lung: Secondary | ICD-10-CM | POA: Diagnosis present

## 2021-08-12 DIAGNOSIS — Z515 Encounter for palliative care: Secondary | ICD-10-CM

## 2021-08-12 DIAGNOSIS — C3412 Malignant neoplasm of upper lobe, left bronchus or lung: Secondary | ICD-10-CM

## 2021-08-12 DIAGNOSIS — I509 Heart failure, unspecified: Secondary | ICD-10-CM

## 2021-08-12 DIAGNOSIS — E43 Unspecified severe protein-calorie malnutrition: Secondary | ICD-10-CM

## 2021-08-12 NOTE — Progress Notes (Signed)
[name] Consult Note Telephone: [phone]  Fax: [phone]  PATIENT NAME: [name] [place] [address] [phone] (home)  DOB: 1950/09/17 MRN: [id number]  PRIMARY CARE PROVIDER:    Janie Morning, DO,  [address] [place] [address] [phone]  REFERRING PROVIDER:   Janie King, [place],  [place] [address] [phone]  RESPONSIBLE PARTY:  Self/[name] - daughter Best number to call [phone] - [name]- significant other A nurse, works for [employer]     Name Relation Home Work [place] Significant other [phone]     Unknown last name,[name] Granddaughter [phone]        TELEHEALTH VISIT STATEMENT Due to the COVID-19 crisis, this visit was done via telemedicine from my office and it was initiated and consent by this patient and or family. Video-audio (telehealth) contact was unable to be done due to technical barriers from the patients side. I connected with patient OR PROXY by a telephone  and verified that I am speaking with the correct person. I discussed the limitations of evaluation and management by telemedicine. The patient expressed understanding and agreed to proceed. Palliative Care was asked to follow this patient to address advance care planning, complex medical decision making and goals of care clarification.   [name] is with patient during visit.     ASSESSMENT AND / RECOMMENDATIONS:   CODE STATUS: . Patient is a Do Not Resuscitate  Goals of Care: Goals include to maximize quality of life and symptom management. Patient wishes to regain back his strength and be able to go outside in his yard as before. Patient/family is interested in hospice service when patient qualifies for it.   Symptom Management/Plan: Lung CA: completed 3 radiation treatments.  Patient recently had a  PET scan for restaging and found to have a slowly enlarging lesion within the right lower lobe in November 2022; plan is to do 3 more rounds of radiation this week; no chemotherapy planned. Education on what to expect/side effects of radiation. Follow up with Pulmonologist - Dr [name] - 09/02/21.  COPD:Continue Breztri, Albuterol, Oxygen 2 L Min. Patient had quit smoking for about 3 months, smoked one cigarette three days ago and determined not to smoke again. Validation provided; education reinforced on the need and benefits of not smoking. Education on deep slow breathing. Continue with flutter valve and Incentive spirometer as ordered. Followed by Pulmonologist.  CHF: Lasix 40 mg BID. Elevation of BLE encouraged to promote circulation. Adhere to fluid/salt limits.   Protein Caloric Malnutrition: Current weight 84 Ibs up from 82 Ibs last month.   Offer 4-6 small high nutrient/caloric meals. Continue Boost  nutritional drink BID. DASH diet discussed, encouraged to incorporate fruits and vegetables into his diet. Routine CBC CMP.  Follow up: Palliative care will continue to follow for complex medical decision making, advance care planning, and clarification of goals. Return 6 weeks or prn. Encouraged to call provider sooner with any concerns.   Family /Caregiver/Community Supports: Patient lives at home with his significant.   HOSPICE ELIGIBILITY/DIAGNOSIS: TBD  Chief Complaint: Follow up visit  HISTORY OF PRESENT ILLNESS:  [name] is a 71 y.o. year old male  with  multiple morbidities requiring close monitoring and with high risk of complications and  mortality: Lung CA, Protein caloric malnutrition,  COPD, CHF, Depression, HTN. Patient  recently had a PET scan Nov '22 for  restaging of his lung CA and was found to have a slowly enlarging lesion within the right lower lobe. Patient will resume radiation treatment this week. Patient reports resolved BLE edema, no difficulty breathing; SOB on  exertion, 02 augmentation is ongoing.  History obtained from review of EMR, discussion with primary team, caregiver, family and/or [name].  Review and summarization of Epic records shows history from other than patient. Rest of 10 point ROS asked and negative.  I reviewed as needed, available labs, patient records, imaging, studies and related documents from the EMR.    PAST MEDICAL HISTORY:  Active Ambulatory Problems    Diagnosis Date Noted   Pancreatic mass 11/23/2014   Cancer of upper lobe of left lung ([place]) 12/21/2014   Pneumothorax, traumatic    Pneumothorax of left lung after biopsy 01/04/2015   Former smoker 01/04/2015   Hyponatremia 01/05/2015   Hypokalemia 01/05/2015   COPD, severe ([place]) 01/05/2015   Essential hypertension 01/05/2015   Protein-calorie malnutrition, severe ([place]) 01/05/2015   Primary cancer of right upper lobe of lung ([place]) 11/30/2017   Severe claudication ([place]) 03/28/2018   Incarcerated left inguinal hernia s/p repair 06/18/2018 06/14/2018   PAD (peripheral artery disease) (HCC)    Acute hypoxemic respiratory failure (HCC)    SBO (small bowel obstruction) ([place])    Inguinal hernia of left side with obstruction and without gangrene    On total parenteral nutrition (TPN)    Alcoholism ([place]) 07/26/2018   Memory loss 10/11/2018   Abnormal brain MRI 10/11/2018   Diverticulosis 01/31/2019   Internal hemorrhoids 09/17/2017   Nodule of lower lobe of left lung 01/22/2021   Lung nodules    Acute respiratory failure with hypoxia and hypercarbia ([place]) 05/10/2021   Pulmonary embolism ([place]) 05/11/2021   Lung cancer ([place]) 05/11/2021   Cancer of lower lobe of left lung ([place]) 07/04/2021   Pulmonary hypertension ([place]) 07/09/2021   NSCLC of left lung ([place]) 07/09/2021   NSCLC of right lung ([place]) 07/09/2021   Chronic respiratory failure with hypoxia and hypercapnia ([place]) 07/09/2021   Resolved Ambulatory Problems    Diagnosis Date Noted   Pneumothorax  69/62/9528   Acute metabolic encephalopathy    Past Medical History:  Diagnosis Date   Allergies    Anxiety    COPD (chronic obstructive pulmonary disease) ([place])    Hypertension    Iliac artery stenosis, right (HCC)    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[place])    Lower limb ischemia 03/26/2018   Lung cancer    Pancreatitis    Status post left foot surgery    Tobacco abuse     SOCIAL HX:  Social History   Tobacco Use   Smoking status: Some Days    Packs/day: 1.00    Years: 50.00    Pack years: 50.00    Types: Cigarettes    Last attempt to quit: 06/25/2021    Years since quitting: 0.1   Smokeless tobacco: Never  Substance Use Topics   Alcohol use: Yes    Alcohol/week: 6.0 standard drinks    Types: 6 Cans of beer per week     FAMILY HX:  Family History  Problem Relation Age of Onset   COPD Mother    CAD Mother    Diabetes Father    Cancer Brother        metastatic, unknown primary   Cancer Paternal Grandmother       ALLERGIES:  Allergies  Allergen Reactions   Prevacid [Lansoprazole] Other (See Comments)  Bloating ("became swollen with gas")   Varenicline Nausea And Vomiting and Other (See Comments)    Vivid, bad dreams also   Ciprofloxacin Other (See Comments)    GI Upset   Mirtazapine Other (See Comments)    Negatively affected sleep   Omeprazole Other (See Comments)    "Gas"      PERTINENT MEDICATIONS:  Outpatient Encounter Medications as of 08/12/2021  Medication Sig   albuterol (PROVENTIL) (2.5 MG/3ML) 0.083% nebulizer solution Take 3 mLs (2.5 mg total) by nebulization every 2 (two) hours as needed for wheezing or shortness of breath.   albuterol (VENTOLIN HFA) 108 (90 Base) MCG/ACT inhaler Inhale 2 puffs into the lungs every 6 (six) hours as needed for shortness of breath.   b complex vitamins capsule Take 1 capsule by mouth daily.   Budeson-Glycopyrrol-Formoterol (BREZTRI AEROSPHERE) 160-9-4.8 MCG/ACT AERO Inhale 2 puffs into the lungs 2 (two) times daily.    buPROPion (WELLBUTRIN XL) 150 MG 24 hr tablet Take 150 mg by mouth every King.    CALCIUM PO Take 1 tablet by mouth daily.   Cholecalciferol (VITAMIN D-3 PO) Take 1 capsule by mouth daily.   fluticasone (FLONASE) 50 MCG/ACT nasal spray Place 2 sprays into both nostrils daily as needed for allergies.   furosemide (LASIX) 40 MG tablet Take 1 tablet (40 mg total) by mouth 2 (two) times daily.   ipratropium (ATROVENT) 0.02 % nebulizer solution Take 0.25-0.5 mg by nebulization every 6 (six) hours as needed for wheezing or shortness of breath.   lactose free nutrition (BOOST) LIQD Take 237 mLs by mouth 3 (three) times daily between meals.   metoprolol succinate (TOPROL-XL) 25 MG 24 hr tablet Take 1 tablet (25 mg total) by mouth daily.   Nutritional Supplements (CARNATION BREAKFAST ESSENTIALS PO) Take 1 packet by mouth daily.   OXYGEN 24 hrs   potassium chloride (KLOR-CON M) 10 MEQ tablet Take 1 tablet (10 mEq total) by mouth daily.   Pseudoeph-Bromphen-DM (BROMPHENIRAMINE-DM-PSE PO) Take 5 mLs by mouth 3 (three) times daily as needed (for coughing).   XARELTO 20 MG TABS tablet Take 1 tablet by mouth once daily with supper   No facility-administered encounter medications on file as of 08/12/2021.     I spent 45 minutes providing this consultation; this includes time spent with patient/family, chart review and documentation. More than 50% of the time in this consultation was spent on counseling and coordinating communication      Thank you for the opportunity to participate in the care of Mr. Mcglothen.  The palliative care team will continue to follow. Please call our office at [phone] if we can be of additional assistance.   Note: Portions of this note were generated with Lobbyist. Dictation errors may occur despite best attempts at proofreading.  Teodoro Spray, NP

## 2021-08-15 ENCOUNTER — Ambulatory Visit
Admission: RE | Admit: 2021-08-15 | Discharge: 2021-08-15 | Disposition: A | Discharge status: Home / Self Care | Payer: Medicare Other | Source: Ambulatory Visit | Attending: Radiation Oncology | Admitting: Radiation Oncology

## 2021-08-15 ENCOUNTER — Other Ambulatory Visit: Payer: Self-pay

## 2021-08-15 DIAGNOSIS — C3411 Malignant neoplasm of upper lobe, right bronchus or lung: Secondary | ICD-10-CM

## 2021-08-19 ENCOUNTER — Ambulatory Visit: Payer: Medicare Other | Admitting: Radiation Oncology

## 2021-08-20 ENCOUNTER — Ambulatory Visit: Payer: Medicare Other | Admitting: Radiation Oncology

## 2021-08-21 ENCOUNTER — Ambulatory Visit: Payer: Medicare Other | Admitting: Radiation Oncology

## 2021-08-22 ENCOUNTER — Ambulatory Visit: Payer: Medicare Other | Admitting: Radiation Oncology

## 2021-08-22 ENCOUNTER — Ambulatory Visit
Admission: RE | Admit: 2021-08-22 | Discharge: 2021-08-22 | Disposition: A | Discharge status: Home / Self Care | Payer: Medicare Other | Source: Ambulatory Visit | Attending: Radiation Oncology | Admitting: Radiation Oncology

## 2021-08-22 DIAGNOSIS — C3411 Malignant neoplasm of upper lobe, right bronchus or lung: Secondary | ICD-10-CM | POA: Diagnosis not present

## 2021-08-26 ENCOUNTER — Other Ambulatory Visit: Payer: Self-pay | Admitting: Radiation Oncology

## 2021-08-26 ENCOUNTER — Encounter: Payer: Self-pay | Admitting: Radiation Oncology

## 2021-08-26 ENCOUNTER — Other Ambulatory Visit: Payer: Self-pay

## 2021-08-26 ENCOUNTER — Ambulatory Visit
Admission: RE | Admit: 2021-08-26 | Discharge: 2021-08-26 | Disposition: A | Discharge status: Home / Self Care | Payer: Medicare Other | Source: Ambulatory Visit | Attending: Radiation Oncology | Admitting: Radiation Oncology

## 2021-08-26 DIAGNOSIS — C3411 Malignant neoplasm of upper lobe, right bronchus or lung: Secondary | ICD-10-CM | POA: Diagnosis not present

## 2021-08-26 MED ORDER — MEGESTROL ACETATE 625 MG/5ML PO SUSP: 625.0000 mg | Freq: Every day | ORAL | 5 refills | Status: DC

## 2021-09-02 ENCOUNTER — Encounter: Payer: Self-pay | Admitting: Pulmonary Disease

## 2021-09-02 ENCOUNTER — Other Ambulatory Visit: Payer: Self-pay

## 2021-09-02 ENCOUNTER — Ambulatory Visit (INDEPENDENT_AMBULATORY_CARE_PROVIDER_SITE_OTHER): Payer: Medicare Other | Admitting: Pulmonary Disease

## 2021-09-02 VITALS — BP 108/62 | HR 92 | Temp 97.4°F | Ht 60.0 in | Wt 78.8 lb

## 2021-09-02 DIAGNOSIS — C3492 Malignant neoplasm of unspecified part of left bronchus or lung: Secondary | ICD-10-CM | POA: Diagnosis not present

## 2021-09-02 DIAGNOSIS — I272 Pulmonary hypertension, unspecified: Secondary | ICD-10-CM

## 2021-09-02 DIAGNOSIS — C3491 Malignant neoplasm of unspecified part of right bronchus or lung: Secondary | ICD-10-CM

## 2021-09-02 DIAGNOSIS — J449 Chronic obstructive pulmonary disease, unspecified: Secondary | ICD-10-CM

## 2021-09-02 DIAGNOSIS — E43 Unspecified severe protein-calorie malnutrition: Secondary | ICD-10-CM

## 2021-09-02 NOTE — Progress Notes (Signed)
? ?Synopsis: Referred in October 2022 for lung nodules by Janie Morning, DO ? ?Subjective:  ? ?PATIENT ID: [name] GENDER: male DOB: 10-26-1950, MRN: [id number] ? ?Chief Complaint  ?Patient presents with  ? Follow-up  ?  Follow up. Patient says he is weak and coughing more.   ? ? ?This is a 71 year old gentleman, past medical history of COPD, current smoker, hypertension, tobacco abuse.  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  The first 1 in 2016 and the other in 2019 both treated with SBRT.  He has had subsequent follow-up imaging with radiation oncology.  Recent restaging PET scan with a slowly enlarging lesion within the right lower lobe that they could not rule out inflammatory versus malignancy as well as a more solid peripheral subpleural nodule within the left upper lobe concerning for malignancy.  Patient was referred here for consideration for tissue biopsy of both lesions.  Patient states that he had percutaneous biopsy in the past and suffered a pneumothorax.  This was after biopsy of his right upper lobe apical lesion in 2019. ? ?OV 07/01/2021: Here today for follow-up after bronchoscopy.  Patient was seen by Eric Form as well.He has already started treatments and completed SBRT with radiation oncology.  Salt Tammy in the office on 05/28/2021.  Currently on Xarelto medications had to be switched due to recent CTA with concern of chronic PE.  She encouraged smoking cessation.  Continuation of his triple therapy inhaler regimen.  Modifications to his diet due to his severe protein calorie malnutrition.  Unfortunately patient has had increasing swelling of the bilateral lower extremities.  He was on Lasix for approximately a month after discharge.  He did not have his Lasix refilled at the pharmacy. ? ?OV 09/02/2021: Here today for follow-up.  Recently saw Roxan Diesel, NP pulmonary hypertension and bilateral lower extremity edema.  Patient responded well to Lasix.  His  legs are now normal no additional edema.  Tolerating this well.  Respiratory medications he is using his inhalers.  Still requiring oxygen.  Unfortunately his BMI is still low.  He has been using his nebulizer as well.  He is doing the best he is can.  Overall has been relatively stable for the past few weeks.  He still has cough sputum production which is a daily issue. ? ? ? ? ? ?Past Medical History:  ?Diagnosis Date  ? Allergies   ? being around corn & grain causes itchy eyes, nasal congestion. He can eat these with no problems.  ? Anxiety   ? COPD (chronic obstructive pulmonary disease) ([place])   ? Hypertension   ? Iliac artery stenosis, right ([place])   ? 80% Stenosis  ? 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[place])   ? Lower limb ischemia 03/26/2018  ? Lung cancer   ? LUL adenocarcinoma 12/28/14, s/p SBRT; poorly differentiated NSCLC RUL 11/23/17, s/p SBRT  ? Pancreatitis   ? x2 stents placed to make patent duct to pancreas  ? Pulmonary hypertension ([place])   ? PASP 48 mmHg 03/26/18 (echo, [facility])  ? Severe claudication ([place])   ? Status post left foot surgery   ? Tobacco abuse   ?  ? ?Family History  ?Problem Relation Age of Onset  ? COPD Mother   ? CAD Mother   ? Diabetes Father   ? Cancer Brother   ?     metastatic, unknown primary  ? Cancer Paternal Grandmother   ?  ? ?Past Surgical  History:  ?Procedure Laterality Date  ? ABDOMINAL AORTOGRAM W/LOWER EXTREMITY N/A 03/30/2018  ? Procedure: ABDOMINAL AORTOGRAM W/LOWER EXTREMITY;  Surgeon: Nigel Mormon, MD;  Location: [facility];  Service: Cardiovascular;  Laterality: N/A;  ? BRONCHIAL BIOPSY  04/23/2021  ? Procedure: BRONCHIAL BIOPSIES;  Surgeon: Garner Nash, DO;  Location: [facility];  Service: Pulmonary;;  ? BRONCHIAL BRUSHINGS  04/23/2021  ? Procedure: BRONCHIAL BRUSHINGS;  Surgeon: Garner Nash, DO;  Location: [facility];  Service: Pulmonary;;  ? BRONCHIAL NEEDLE ASPIRATION BIOPSY  04/23/2021  ? Procedure: BRONCHIAL NEEDLE ASPIRATION  BIOPSIES;  Surgeon: Garner Nash, DO;  Location: [facility];  Service: Pulmonary;;  ? COLECTOMY    ? COLOSTOMY    ? COLOSTOMY REVERSAL    ? EUS N/A 12/14/2014  ? Procedure: UPPER ENDOSCOPIC ULTRASOUND (EUS) LINEAR;  Surgeon: Milus Banister, MD;  Location: [facility];  Service: Endoscopy;  Laterality: N/A;  ? FIDUCIAL MARKER PLACEMENT  04/23/2021  ? Procedure: FIDUCIAL MARKER PLACEMENT;  Surgeon: Garner Nash, DO;  Location: [facility];  Service: Pulmonary;;  ? FOOT SURGERY Left 2006  ? INGUINAL HERNIA REPAIR Left 06/18/2018  ? Procedure: OPEN RECURRENT LEFT INGUINAL HERNIA REPAIR WITH MESH;  Surgeon: Armandina Gemma, MD;  Location: [facility];  Service: General;  Laterality: Left;  ? INSERTION OF MESH Left 06/18/2018  ? Procedure: INSERTION OF MESH;  Surgeon: Armandina Gemma, MD;  Location: [facility];  Service: General;  Laterality: Left;  ? LAPAROTOMY N/A 06/30/2018  ? Procedure: EXPLORATORY LAPAROTOMY LYSISIS OF ADHESIONS;  Surgeon: Jovita Kussmaul, MD;  Location: [facility];  Service: General;  Laterality: N/A;  ? PANCREAS SURGERY  2007  ? PERIPHERAL VASCULAR INTERVENTION  03/30/2018  ? Procedure: PERIPHERAL VASCULAR INTERVENTION;  Surgeon: Nigel Mormon, MD;  Location: [facility];  Service: Cardiovascular;;  Rt Iliac  ? UPPER GI ENDOSCOPY    ? [place]  ? VIDEO BRONCHOSCOPY WITH RADIAL ENDOBRONCHIAL ULTRASOUND  04/23/2021  ? Procedure: VIDEO BRONCHOSCOPY WITH RADIAL ENDOBRONCHIAL ULTRASOUND;  Surgeon: Garner Nash, DO;  Location: [facility];  Service: Pulmonary;;  ? ? ?Social History  ? ?Socioeconomic History  ? Marital status: Significant Other  ?  Spouse name: Not on file  ? Number of children: 3  ? Years of education: 30  ? Highest education level: Not on file  ?Occupational History  ? Occupation: Retired  ?Tobacco Use  ? Smoking status: Some Days  ?  Packs/day: 1.00  ?  Years: 50.00  ?  Pack years: 50.00  ?  Types: Cigarettes  ?  Last attempt to quit: 06/25/2021  ?  Years since  quitting: 0.1  ? Smokeless tobacco: Never  ?Vaping Use  ? Vaping Use: Former  ?Substance and Sexual Activity  ? Alcohol use: Yes  ?  Alcohol/week: 6.0 standard drinks  ?  Types: 6 Cans of beer per week  ? Drug use: Yes  ?  Types: Marijuana  ?  Comment: Last use 04/20/21  ? Sexual activity: Yes  ?  Birth control/protection: Injection  ?Other Topics Concern  ? Not on file  ?Social History Narrative  ? Lives at home w/ a friend  ? Left-handed  ? Caffeine: 2 cups of coffee per day  ? ?Social Determinants of Health  ? ?Financial Resource Strain: Not on file  ?Food Insecurity: Not on file  ?Transportation Needs: Not on file  ?Physical Activity: Not on file  ?Stress: Not on file  ?  Social Connections: Not on file  ?Intimate Partner Violence: Not on file  ?  ? ?Allergies  ?Allergen Reactions  ? Prevacid [Lansoprazole] Other (See Comments)  ?  Bloating ("became swollen with gas")  ? Varenicline Nausea And Vomiting and Other (See Comments)  ?  Vivid, bad dreams also  ? Ciprofloxacin Other (See Comments)  ?  GI Upset  ? Mirtazapine Other (See Comments)  ?  Negatively affected sleep  ? Omeprazole Other (See Comments)  ?  "Gas"  ?  ? ?Outpatient Medications Prior to Visit  ?Medication Sig Dispense Refill  ? albuterol (PROVENTIL) (2.5 MG/3ML) 0.083% nebulizer solution Take 3 mLs (2.5 mg total) by nebulization every 2 (two) hours as needed for wheezing or shortness of breath. 75 mL 1  ? albuterol (VENTOLIN HFA) 108 (90 Base) MCG/ACT inhaler Inhale 2 puffs into the lungs every 6 (six) hours as needed for shortness of breath. 1 each 3  ? b complex vitamins capsule Take 1 capsule by mouth daily.    ? Budeson-Glycopyrrol-Formoterol (BREZTRI AEROSPHERE) 160-9-4.8 MCG/ACT AERO Inhale 2 puffs into the lungs 2 (two) times daily. 5.9 g 3  ? buPROPion (WELLBUTRIN XL) 150 MG 24 hr tablet Take 150 mg by mouth every morning.     ? CALCIUM PO Take 1 tablet by mouth daily.    ? Cholecalciferol (VITAMIN D-3 PO) Take 1 capsule by mouth daily.    ?  fluticasone (FLONASE) 50 MCG/ACT nasal spray Place 2 sprays into both nostrils daily as needed for allergies.    ? furosemide (LASIX) 40 MG tablet Take 1 tablet (40 mg total) by mouth 2 (two) times daily

## 2021-09-02 NOTE — Patient Instructions (Signed)
Thank you for visiting Dr. Valeta Harms at [facility]. ?Today we recommend the following: ? ?Dont change your meds ?EAT! EAT!  ?Call us if anything changes  ? ?Return in about 3 months (around 12/03/2021) for [facility] or Dr. Valeta Harms . ? ? ? ?Please do your part to reduce the spread of COVID-19.  ? ?

## 2021-09-13 ENCOUNTER — Other Ambulatory Visit: Payer: Self-pay

## 2021-09-13 ENCOUNTER — Emergency Department (HOSPITAL_COMMUNITY): Payer: Medicare Other

## 2021-09-13 ENCOUNTER — Inpatient Hospital Stay (HOSPITAL_COMMUNITY)
Admission: EM | Admit: 2021-09-13 | Discharge: 2021-09-16 | DRG: 190 | Disposition: A | Discharge status: Home / Self Care | Payer: Medicare Other | Attending: Internal Medicine | Admitting: Internal Medicine

## 2021-09-13 ENCOUNTER — Encounter (HOSPITAL_COMMUNITY): Payer: Self-pay

## 2021-09-13 DIAGNOSIS — I5032 Chronic diastolic (congestive) heart failure: Secondary | ICD-10-CM | POA: Diagnosis present

## 2021-09-13 DIAGNOSIS — Z681 Body mass index (BMI) 19 or less, adult: Secondary | ICD-10-CM

## 2021-09-13 DIAGNOSIS — Z9981 Dependence on supplemental oxygen: Secondary | ICD-10-CM

## 2021-09-13 DIAGNOSIS — E43 Unspecified severe protein-calorie malnutrition: Secondary | ICD-10-CM | POA: Diagnosis present

## 2021-09-13 DIAGNOSIS — E11649 Type 2 diabetes mellitus with hypoglycemia without coma: Secondary | ICD-10-CM | POA: Diagnosis not present

## 2021-09-13 DIAGNOSIS — I10 Essential (primary) hypertension: Secondary | ICD-10-CM | POA: Diagnosis present

## 2021-09-13 DIAGNOSIS — I110 Hypertensive heart disease with heart failure: Secondary | ICD-10-CM | POA: Diagnosis present

## 2021-09-13 DIAGNOSIS — Z79899 Other long term (current) drug therapy: Secondary | ICD-10-CM

## 2021-09-13 DIAGNOSIS — Z85118 Personal history of other malignant neoplasm of bronchus and lung: Secondary | ICD-10-CM | POA: Diagnosis not present

## 2021-09-13 DIAGNOSIS — Z888 Allergy status to other drugs, medicaments and biological substances status: Secondary | ICD-10-CM | POA: Diagnosis not present

## 2021-09-13 DIAGNOSIS — E876 Hypokalemia: Secondary | ICD-10-CM | POA: Diagnosis present

## 2021-09-13 DIAGNOSIS — Z5320 Procedure and treatment not carried out because of patient's decision for unspecified reasons: Secondary | ICD-10-CM | POA: Diagnosis present

## 2021-09-13 DIAGNOSIS — F17210 Nicotine dependence, cigarettes, uncomplicated: Secondary | ICD-10-CM | POA: Diagnosis present

## 2021-09-13 DIAGNOSIS — J439 Emphysema, unspecified: Principal | ICD-10-CM | POA: Diagnosis present

## 2021-09-13 DIAGNOSIS — Z881 Allergy status to other antibiotic agents status: Secondary | ICD-10-CM

## 2021-09-13 DIAGNOSIS — J9621 Acute and chronic respiratory failure with hypoxia: Secondary | ICD-10-CM | POA: Diagnosis present

## 2021-09-13 DIAGNOSIS — T380X5A Adverse effect of glucocorticoids and synthetic analogues, initial encounter: Secondary | ICD-10-CM | POA: Diagnosis present

## 2021-09-13 DIAGNOSIS — E1165 Type 2 diabetes mellitus with hyperglycemia: Secondary | ICD-10-CM

## 2021-09-13 DIAGNOSIS — Z825 Family history of asthma and other chronic lower respiratory diseases: Secondary | ICD-10-CM

## 2021-09-13 DIAGNOSIS — Z66 Do not resuscitate: Secondary | ICD-10-CM | POA: Diagnosis present

## 2021-09-13 DIAGNOSIS — Z20822 Contact with and (suspected) exposure to covid-19: Secondary | ICD-10-CM | POA: Diagnosis present

## 2021-09-13 DIAGNOSIS — J441 Chronic obstructive pulmonary disease with (acute) exacerbation: Principal | ICD-10-CM | POA: Diagnosis present

## 2021-09-13 DIAGNOSIS — Z8249 Family history of ischemic heart disease and other diseases of the circulatory system: Secondary | ICD-10-CM | POA: Diagnosis not present

## 2021-09-13 DIAGNOSIS — E871 Hypo-osmolality and hyponatremia: Secondary | ICD-10-CM | POA: Diagnosis not present

## 2021-09-13 DIAGNOSIS — Z7951 Long term (current) use of inhaled steroids: Secondary | ICD-10-CM | POA: Diagnosis not present

## 2021-09-13 DIAGNOSIS — Z7901 Long term (current) use of anticoagulants: Secondary | ICD-10-CM | POA: Diagnosis not present

## 2021-09-13 DIAGNOSIS — I2782 Chronic pulmonary embolism: Secondary | ICD-10-CM | POA: Diagnosis not present

## 2021-09-13 DIAGNOSIS — Z720 Tobacco use: Secondary | ICD-10-CM

## 2021-09-13 DIAGNOSIS — R21 Rash and other nonspecific skin eruption: Secondary | ICD-10-CM

## 2021-09-13 DIAGNOSIS — Z86711 Personal history of pulmonary embolism: Secondary | ICD-10-CM | POA: Diagnosis not present

## 2021-09-13 DIAGNOSIS — I2699 Other pulmonary embolism without acute cor pulmonale: Secondary | ICD-10-CM | POA: Diagnosis present

## 2021-09-13 LAB — BASIC METABOLIC PANEL
Anion gap: 7 (ref 5–15)
BUN: 43 mg/dL — ABNORMAL HIGH (ref 8–23)
CO2: 27 mmol/L (ref 22–32)
Calcium: 8.8 mg/dL — ABNORMAL LOW (ref 8.9–10.3)
Chloride: 97 mmol/L — ABNORMAL LOW (ref 98–111)
Creatinine, Ser: 0.85 mg/dL (ref 0.61–1.24)
GFR, Estimated: >60 mL/min (ref >60)
Glucose, Bld: 125 mg/dL — ABNORMAL HIGH (ref 70–99)
Potassium: 4.3 mmol/L (ref 3.5–5.1)
Sodium: 131 mmol/L — ABNORMAL LOW (ref 135–145)

## 2021-09-13 LAB — CBC WITH DIFFERENTIAL/PLATELET
Abs Immature Granulocytes: 0.16 10*3/uL — ABNORMAL HIGH (ref 0.00–0.07)
Basophils Absolute: 0.1 10*3/uL (ref 0.0–0.1)
Basophils Relative: 1 %
Eosinophils Absolute: 0.4 10*3/uL (ref 0.0–0.5)
Eosinophils Relative: 4 %
HCT: 37.5 % — ABNORMAL LOW (ref 39.0–52.0)
Hemoglobin: 12.5 g/dL — ABNORMAL LOW (ref 13.0–17.0)
Immature Granulocytes: 2 %
Lymphocytes Relative: 8 %
Lymphs Abs: 0.7 10*3/uL (ref 0.7–4.0)
MCH: 30.7 pg (ref 26.0–34.0)
MCHC: 33.3 g/dL (ref 30.0–36.0)
MCV: 92.1 fL (ref 80.0–100.0)
Monocytes Absolute: 0.7 10*3/uL (ref 0.1–1.0)
Monocytes Relative: 7 %
Neutro Abs: 7.6 10*3/uL (ref 1.7–7.7)
Neutrophils Relative %: 78 %
Platelets: 324 10*3/uL (ref 150–400)
RBC: 4.07 MIL/uL — ABNORMAL LOW (ref 4.22–5.81)
RDW: 17.4 % — ABNORMAL HIGH (ref 11.5–15.5)
WBC: 9.7 10*3/uL (ref 4.0–10.5)
nRBC: 0 % (ref 0.0–0.2)

## 2021-09-13 LAB — RESP PANEL BY RT-PCR (FLU A&B, COVID) ARPGX2
Influenza A by PCR: NEGATIVE
Influenza B by PCR: NEGATIVE
SARS Coronavirus 2 by RT PCR: NEGATIVE

## 2021-09-13 LAB — PROCALCITONIN: Procalcitonin: <0.1 ng/mL

## 2021-09-13 LAB — MAGNESIUM: Magnesium: 1.8 mg/dL (ref 1.7–2.4)

## 2021-09-13 LAB — BRAIN NATRIURETIC PEPTIDE: B Natriuretic Peptide: 1810.8 pg/mL — ABNORMAL HIGH (ref 0.0–100.0)

## 2021-09-13 MED ORDER — IPRATROPIUM-ALBUTEROL 0.5-2.5 (3) MG/3ML IN SOLN
3.0000 mL | Freq: Four times a day (QID) | RESPIRATORY_TRACT | Status: DC
  Administered 2021-09-13: 3 mL via RESPIRATORY_TRACT
  Filled 2021-09-13: qty 3

## 2021-09-13 MED ORDER — ALBUTEROL SULFATE (2.5 MG/3ML) 0.083% IN NEBU
10.0000 mg | INHALATION_SOLUTION | Freq: Once | RESPIRATORY_TRACT | Status: AC
  Administered 2021-09-13: 10 mg via RESPIRATORY_TRACT

## 2021-09-13 MED ORDER — SODIUM CHLORIDE 0.9 % IV SOLN
500.0000 mg | INTRAVENOUS | Status: DC
  Administered 2021-09-13: 500 mg via INTRAVENOUS
  Filled 2021-09-13 (×2): qty 5

## 2021-09-13 MED ORDER — ALBUTEROL SULFATE (2.5 MG/3ML) 0.083% IN NEBU: 2.5000 mg | INHALATION_SOLUTION | RESPIRATORY_TRACT | Status: DC | PRN

## 2021-09-13 MED ORDER — RIVAROXABAN 20 MG PO TABS
20.0000 mg | ORAL_TABLET | Freq: Every day | ORAL | Status: DC
  Administered 2021-09-14 – 2021-09-16 (×3): 20 mg via ORAL
  Filled 2021-09-13 (×3): qty 1

## 2021-09-13 MED ORDER — METHYLPREDNISOLONE SODIUM SUCC 125 MG IJ SOLR
125.0000 mg | INTRAMUSCULAR | Status: AC
  Administered 2021-09-13: 125 mg via INTRAVENOUS
  Filled 2021-09-13: qty 2

## 2021-09-13 MED ORDER — ALBUTEROL SULFATE (2.5 MG/3ML) 0.083% IN NEBU
10.0000 mg/h | INHALATION_SOLUTION | Freq: Once | RESPIRATORY_TRACT | Status: DC
  Filled 2021-09-13: qty 20
  Filled 2021-09-13: qty 3

## 2021-09-13 MED ORDER — ACETAMINOPHEN 650 MG RE SUPP: 650.0000 mg | Freq: Four times a day (QID) | RECTAL | Status: DC | PRN

## 2021-09-13 MED ORDER — IPRATROPIUM-ALBUTEROL 0.5-2.5 (3) MG/3ML IN SOLN
3.0000 mL | Freq: Three times a day (TID) | RESPIRATORY_TRACT | Status: DC
  Administered 2021-09-14 (×3): 3 mL via RESPIRATORY_TRACT
  Filled 2021-09-13 (×3): qty 3

## 2021-09-13 MED ORDER — METOPROLOL SUCCINATE ER 25 MG PO TB24
25.0000 mg | ORAL_TABLET | Freq: Every day | ORAL | Status: DC
  Administered 2021-09-14 – 2021-09-16 (×3): 25 mg via ORAL
  Filled 2021-09-13 (×3): qty 1

## 2021-09-13 MED ORDER — MEGESTROL ACETATE 400 MG/10ML PO SUSP
625.0000 mg | Freq: Every day | ORAL | Status: DC
  Administered 2021-09-14 – 2021-09-16 (×3): 625 mg via ORAL
  Filled 2021-09-13 (×3): qty 20

## 2021-09-13 MED ORDER — BUPROPION HCL ER (XL) 150 MG PO TB24
150.0000 mg | ORAL_TABLET | Freq: Every morning | ORAL | Status: DC
  Administered 2021-09-14 – 2021-09-16 (×3): 150 mg via ORAL
  Filled 2021-09-13 (×3): qty 1

## 2021-09-13 MED ORDER — ACETAMINOPHEN 325 MG PO TABS: 650.0000 mg | ORAL_TABLET | Freq: Four times a day (QID) | ORAL | Status: DC | PRN

## 2021-09-13 MED ORDER — METHYLPREDNISOLONE SODIUM SUCC 125 MG IJ SOLR
80.0000 mg | Freq: Two times a day (BID) | INTRAMUSCULAR | Status: DC
Start: 2021-09-14 — End: 2021-09-14
  Administered 2021-09-14: 80 mg via INTRAVENOUS
  Filled 2021-09-13: qty 2

## 2021-09-13 MED ORDER — POTASSIUM CHLORIDE CRYS ER 10 MEQ PO TBCR
10.0000 meq | EXTENDED_RELEASE_TABLET | Freq: Every day | ORAL | Status: DC
  Administered 2021-09-14: 10 meq via ORAL
  Filled 2021-09-13: qty 1

## 2021-09-13 MED ORDER — SODIUM CHLORIDE 0.9 % IV SOLN: INTRAVENOUS | Status: DC

## 2021-09-13 MED ORDER — FUROSEMIDE 40 MG PO TABS
40.0000 mg | ORAL_TABLET | Freq: Two times a day (BID) | ORAL | Status: DC
  Administered 2021-09-14 – 2021-09-16 (×5): 40 mg via ORAL
  Filled 2021-09-13 (×5): qty 1

## 2021-09-13 NOTE — ED Provider Notes (Signed)
?[facility] DEPT ?Provider Note ? ? ?CSN: [id number] ?Arrival date & time: 09/13/21  1616 ? ?  ? ?History ? ?Chief Complaint  ?Patient presents with  ? Shortness of Breath  ? ? ?[name] is a 71 y.o. male. ? ?70 year old male with history of CHF and COPD presents with shortness of breath x1 day.  Has had nonproductive cough without fever or chills.  Chest tightness but no overt chest pressure.  No lower extremity edema.  No fever or chills.  Cough is been white in nature.  No hemoptysis.  No vomiting or diarrhea.  Patient is on oxygen chronically at 2 L per has had to increase to 4 today.  Does have home nebulizer butyryl treatments and has been using those as needed ? ? ?  ? ?Home Medications ?Prior to Admission medications   ?Medication Sig Start Date End Date Taking? Authorizing Provider  ?albuterol (PROVENTIL) (2.5 MG/3ML) 0.083% nebulizer solution Take 3 mLs (2.5 mg total) by nebulization every 2 (two) hours as needed for wheezing or shortness of breath. 07/12/18   Meuth, Brooke A, PA-C  ?albuterol (VENTOLIN HFA) 108 (90 Base) MCG/ACT inhaler Inhale 2 puffs into the lungs every 6 (six) hours as needed for shortness of breath. 05/16/21   [place] Eric J, DO  ?b complex vitamins capsule Take 1 capsule by mouth daily.    [provider]  ?Budeson-Glycopyrrol-Formoterol (BREZTRI AEROSPHERE) 160-9-4.8 MCG/ACT AERO Inhale 2 puffs into the lungs 2 (two) times daily. 05/16/21   [place] Donnamarie Poag, DO  ?buPROPion (WELLBUTRIN XL) 150 MG 24 hr tablet Take 150 mg by mouth every morning.  10/30/14   [provider]  ?CALCIUM PO Take 1 tablet by mouth daily.    [provider]  ?Cholecalciferol (VITAMIN D-3 PO) Take 1 capsule by mouth daily.    [provider]  ?fluticasone (FLONASE) 50 MCG/ACT nasal spray Place 2 sprays into both nostrils daily as needed for allergies. 08/27/18   [provider]  ?furosemide (LASIX) 40 MG tablet Take 1 tablet (40 mg  total) by mouth 2 (two) times daily. 07/16/21 10/14/21  Cobb, Karie Schwalbe, NP  ?ipratropium (ATROVENT) 0.02 % nebulizer solution Take 0.25-0.5 mg by nebulization every 6 (six) hours as needed for wheezing or shortness of breath.    [provider]  ?lactose free nutrition (BOOST) LIQD Take 237 mLs by mouth 3 (three) times daily between meals.    [provider]  ?megestrol (MEGACE ES) 625 MG/5ML suspension Take 5 mLs (625 mg total) by mouth daily. 08/26/21   Tyler Pita, MD  ?metoprolol succinate (TOPROL-XL) 25 MG 24 hr tablet Take 1 tablet (25 mg total) by mouth daily. 05/17/21 08/15/21  [place] Eric J, DO  ?Nutritional Supplements (CARNATION BREAKFAST ESSENTIALS PO) Take 1 packet by mouth daily.    [provider]  ?OXYGEN 24 hrs    [provider]  ?potassium chloride (KLOR-CON M) 10 MEQ tablet Take 1 tablet (10 mEq total) by mouth daily. 07/04/21 10/02/21  Garner Nash, DO  ?Pseudoeph-Bromphen-DM (BROMPHENIRAMINE-DM-PSE PO) Take 5 mLs by mouth 3 (three) times daily as needed (for coughing).    [provider]  ?XARELTO 20 MG TABS tablet Take 1 tablet by mouth once daily with supper 07/15/21   Parrett, Fonnie Mu, NP  ?   ? ?Allergies    ?Prevacid [lansoprazole], Varenicline, Ciprofloxacin, Mirtazapine, and Omeprazole   ? ?Review of Systems   ?Review of Systems  ?All other systems reviewed and  are negative. ? ?Physical Exam ?Updated Vital Signs ?BP (!) 161/108   Pulse 62   Temp 97.7 ?F (36.5 ?C) (Oral)   Resp 20   SpO2 92%  ?Physical Exam ?Vitals and nursing note reviewed.  ?Constitutional:   ?   General: He is not in acute distress. ?   Appearance: Normal appearance. He is well-developed. He is not toxic-appearing.  ?HENT:  ?   Head: Normocephalic and atraumatic.  ?Eyes:  ?   General: Lids are normal.  ?   Conjunctiva/sclera: Conjunctivae normal.  ?   Pupils: Pupils are equal, round, and reactive to light.  ?Neck:  ?   Thyroid: No thyroid mass.  ?   Trachea: No  tracheal deviation.  ?Cardiovascular:  ?   Rate and Rhythm: Normal rate and regular rhythm.  ?   Heart sounds: Normal heart sounds. No murmur heard. ?  No gallop.  ?Pulmonary:  ?   Effort: Tachypnea present. No respiratory distress.  ?   Breath sounds: Decreased air movement present. No stridor. Examination of the right-upper field reveals decreased breath sounds and wheezing. Examination of the left-upper field reveals decreased breath sounds and wheezing. Decreased breath sounds and wheezing present. No rhonchi or rales.  ?Abdominal:  ?   General: There is no distension.  ?   Palpations: Abdomen is soft.  ?   Tenderness: There is no abdominal tenderness. There is no rebound.  ?Musculoskeletal:     ?   General: No tenderness. Normal range of motion.  ?   Cervical back: Normal range of motion and neck supple.  ?Skin: ?   General: Skin is warm and dry.  ?   Findings: No abrasion or rash.  ?Neurological:  ?   Mental Status: He is alert and oriented to person, place, and time. Mental status is at baseline.  ?   GCS: GCS eye subscore is 4. GCS verbal subscore is 5. GCS motor subscore is 6.  ?   Cranial Nerves: No cranial nerve deficit.  ?   Sensory: No sensory deficit.  ?   Motor: Motor function is intact.  ?Psychiatric:     ?   Attention and Perception: Attention normal.     ?   Speech: Speech normal.     ?   Behavior: Behavior normal.  ? ? ?ED Results / Procedures / Treatments   ?Labs ?(all labs ordered are listed, but only abnormal results are displayed) ?Labs Reviewed  ?RESP PANEL BY RT-PCR (FLU A&B, COVID) ARPGX2  ?CBC WITH DIFFERENTIAL/PLATELET  ?BASIC METABOLIC PANEL  ?BRAIN NATRIURETIC PEPTIDE  ? ? ?EKG ?EKG Interpretation ? ?Date/Time:  Friday September 13 2021 16:39:02 EDT ?Ventricular Rate:  102 ?PR Interval:  122 ?QRS Duration: 91 ?QT Interval:  353 ?QTC Calculation: 460 ?R Axis:   -72 ?Text Interpretation: Sinus tachycardia Right atrial enlargement Left axis deviation Anteroseptal infarct, age indeterminate  Artifact in lead(s) I II III aVR aVL aVF V1 V2 V6 Confirmed by Lacretia Leigh ([id number]) on 09/13/2021 5:10:15 PM ? ?Radiology ?No results found. ? ?Procedures ?Procedures  ? ? ?Medications Ordered in ED ?Medications  ?0.9 %  sodium chloride infusion (has no administration in time range)  ?albuterol (PROVENTIL,VENTOLIN) solution continuous neb (has no administration in time range)  ? ? ?ED Course/ Medical Decision Making/ A&P ?  ?                        ?Medical Decision Making ?Amount and/or Complexity of  Data Reviewed ?Labs: ordered. ?Radiology: ordered. ?ECG/medicine tests: ordered. ? ?Risk ?Prescription drug management. ? ?Patient is EKG per my interpretation shows sinus tachycardia.  No evidence of acute ischemic changes. ?Patient presented complaining of shortness of breath.  Had wheezing but also has history of CHF.  Chest x-ray per my review showed no evidence of failure.  Patient ordered albuterol 10 mg continuous treatment.  He was also given Solu-Medrol.  Wheezing has improved although patient's oxygen requirement is above baseline.  Discussed with patient and his significant other about the need for admission.  Due to patient's worsening respiratory status patient will require observation admission.  Discussed with hospitalist who will come and admit the patient ? ? ? ? ? ? ? ?Final Clinical Impression(s) / ED Diagnoses ?Final diagnoses:  ?None  ? ? ?Rx / DC Orders ?ED Discharge Orders   ? ? None  ? ?  ? ? ?  ?Lacretia Leigh, MD ?09/13/21 1915 ? ?

## 2021-09-13 NOTE — Assessment & Plan Note (Addendum)
At baseline patient on 2 L/min home oxygen. ?He was hypoxic in the ED in the mid 80s on his home oxygen. ?As per RT, patient uses BiPAP at bedtime, continue.  Patient refused BiPAP overnight. ?Was on room air and saturating in the high 90s during most of the day yesterday and overnight. ?Requested RN to do home O2 evaluation prior to discharge to determine if he still needs home oxygen.  Patient's girlfriend concerned that he needs home oxygen.  I explained to her that if patient does not hypoxic 88% or less, he may not qualify for oxygen and moreover may be less beneficial and more hurtful if continued to use when not needed. ? ?As per RN, patient did not drop oxygen saturations below 93% with activity on room air.  Thereby he does not need to be on oxygen at this time. ?

## 2021-09-13 NOTE — Assessment & Plan Note (Addendum)
Recently diagnosed in December 2022. ?Continue Xarelto. ?As discussed with pharmacy, Megace can increase hyperviscosity and hence will change to Remeron for appetite. ?Low index of suspicion for recurrent PE given improvement with treatment for COPD. ?Megace discontinued due to recent PE and risk for hyper viscosity.  Follow-up with outpatient providers for alternate choices. ?

## 2021-09-13 NOTE — Assessment & Plan Note (Addendum)
Baseline serum sodium 130-133. ?Serum sodium 129- 128. ?Clinically euvolemic. ??  SIADH. ?Could consider tapering off Wellbutrin as outpatient. ?Follow-up BMP closely as outpatient. ?

## 2021-09-13 NOTE — ED Triage Notes (Signed)
EMS reports from home called out for SOB. 2Ltrs 24-7. Hx of CHF and COPD. ? ?BP 160/90 ?HR 110 ?RR 30 ?Sp02 99 @ 6ltrs ? ?10mg  Albuteroal ?0.5 Atrovent ?125 Solumedrol  ? ?20ga L forearm ? ?

## 2021-09-13 NOTE — H&P (Addendum)
?History and Physical  ? ? ?PLEASE NOTE THAT DRAGON DICTATION SOFTWARE WAS USED IN THE CONSTRUCTION OF THIS NOTE. ? ? ?[name] YHC:[id number] DOB: 1951/01/17 DOA: 09/13/2021 ? ?PCP: Janie Morning, DO  ?Patient coming from: home  ? ?I have personally briefly reviewed patient's old medical records in [place] ? ?Chief Complaint: sob ? ?HPI: [name] is a 71 y.o. male with medical history significant for chronic hypoxic respiratory failure on 2 L continuous nasal cannula, severe COPD, chronic diastolic heart failure, essential hypertension, acute pulmonary embolism in December 2022, subsequently anticoagulated on Xarelto, chronic hyponatremia with baseline serum sodium range of [birth year]10[birth year]1931, who is admitted to [facility] on 09/13/2021 with acute COPD exacerbation after presenting from home to [facility] ED complaining of shortness of breath.  ? ?The patient reports 2 days of progressive shortness of breath associated with worsening expiratory wheezing as well as nonproductive cough, worse than baseline.  Denies any associated orthopnea, PND, or new onset peripheral edema.  Not associate any chest pain, diaphoresis, potation's, nausea, vomiting, presyncope, or syncope.  No associated hemoptysis, new lower extremity erythema, or calf tenderness.  He also denies any associated subjective fever, chills, rigors, or generalized myalgias.  No recent headache or neck stiffness, diabetes-restart pressure, abdominal pain ED.  ? ?He has a documented history of severe COPD associated with chronic hypoxic respiratory failure on 2 L continuous nasal cannula.  Reports good compliance with his outpatient respiratory regimen which includes Breztri.  Over the last 2 days, he notes increased use of prn albuterol inhaler, but without any significant improvement in the above respiratory symptoms, prompting him to present to [facility] emergency department today for further evaluation and management thereof. ? ?Of note, he  also has history of chronic diastolic heart failure as well as severe pulm hypertension, with most recent echocardiogram performed on 05/11/2021 notable for LVEF 60 to 65%, no focal wall motion maladies, grade 3 diastolic dysfunction, right ventricle systolic function low normal, right ventricle mildly enlarged, severely elevated pulmonary systolic pressure with estimated pulmonary artery systolic pressure of 64, mildly dilated left atrium, moderate to severe tricuspid regurgitation. ? 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  ? ?Was diagnosed with acute pulmonary embolism approximate 4 months ago, started on Xarelto at that time, and reports good interval/ongoing compliance with this anticoagulant. ? ? ? ?ED Course:  ?Vital signs in the ED were notable for the following: Afebrile; heart rate 9703; blood pressure 158/93; respiratory rate 17-24, initial oxygen saturation noted to be 86% on baseline 2 L nasal cannula, subsequently improving to 96-97% on 4 L nasal cannula. ? ? ?Labs were notable for the following: BMP notable for the following: Sodium 131 compared to most recent prior value 130 on 07/16/2021, bicarbonate 27, creatinine 0.85 relative to most recent prior value of 1.06 on 07/16/2021, glucose 1.  BNP has been ordered, with result currently pending.  CBC notable for white cell count 9700,.  12.1 on 05/25/2021.  COVID-19/influenza PCR negative. ? ?Imaging and additional notable ED work-up: EKG, interpretation of which was limited by the presence of artifact, but within these confines, appears to demonstrate sinus tachycardia with heart rate 102, normal, no evidence of T wave or ST changes, including no evidence of ST elevation.  Chest x-ray shows no evidence of acute cardiopulmonary process, including interval improvement in  previously noted patchy airspace opacities seen on chest x-ray from 07/09/2021.  ? ?While in the ED, the following were administered: Albuterol nebulizer x1, Solu-Medrol 125 mg IV x1. ? ?Subsequently, the patient was admitted for further evaluation and management of presenting acute COPD exacerbation resulting in acute on chronic hypoxic respiratory failure. ? ? ? ?Review of Systems: As per HPI otherwise 10 point review of systems negative.  ? ?Past Medical History:  ?Diagnosis Date  ? Allergies   ? being around corn & grain causes itchy eyes, nasal congestion. He can eat these with no problems.  ? Anxiety   ? COPD (chronic obstructive pulmonary disease) ([place])   ? Hypertension   ? Iliac artery stenosis, right ([place])   ? 80% Stenosis  ? 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[place])   ? Lower limb ischemia 03/26/2018  ? Lung cancer   ? LUL adenocarcinoma 12/28/14, s/p SBRT; poorly differentiated NSCLC RUL 11/23/17, s/p SBRT  ? Pancreatitis   ? x2 stents placed to make patent duct to pancreas  ? Pulmonary hypertension ([place])   ? PASP 48 mmHg 03/26/18 (echo, [facility])  ? Severe claudication ([place])   ? Status post left foot surgery   ? Tobacco abuse   ? ? ?Past Surgical History:  ?Procedure Laterality Date  ? ABDOMINAL AORTOGRAM W/LOWER EXTREMITY N/A 03/30/2018  ? Procedure: ABDOMINAL AORTOGRAM W/LOWER EXTREMITY;  Surgeon: Nigel Mormon, MD;  Location: [facility];  Service: Cardiovascular;  Laterality: N/A;  ? BRONCHIAL BIOPSY  04/23/2021  ? Procedure: BRONCHIAL BIOPSIES;  Surgeon: Garner Nash, DO;  Location: [facility];  Service: Pulmonary;;  ? BRONCHIAL BRUSHINGS  04/23/2021  ? Procedure: BRONCHIAL BRUSHINGS;  Surgeon: Garner Nash, DO;  Location: [facility];  Service: Pulmonary;;  ? BRONCHIAL NEEDLE ASPIRATION BIOPSY  04/23/2021  ? Procedure: BRONCHIAL NEEDLE ASPIRATION BIOPSIES;  Surgeon: Garner Nash, DO;  Location: [facility];  Service: Pulmonary;;  ? COLECTOMY    ? COLOSTOMY    ?  COLOSTOMY REVERSAL    ? EUS N/A 12/14/2014  ? Procedure: UPPER ENDOSCOPIC ULTRASOUND (EUS) LINEAR;  Surgeon: Milus Banister, MD;  Location: [facility];  Service: Endoscopy;  Laterality: N/A;  ? FIDUCIAL MARKER PLACEMENT  04/23/2021  ? Procedure: FIDUCIAL MARKER PLACEMENT;  Surgeon: Garner Nash, DO;  Location: [facility];  Service: Pulmonary;;  ? FOOT SURGERY Left 2006  ? INGUINAL HERNIA REPAIR Left 06/18/2018  ? Procedure: OPEN RECURRENT LEFT INGUINAL HERNIA REPAIR WITH MESH;  Surgeon: Armandina Gemma, MD;  Location: [facility];  Service: General;  Laterality: Left;  ? INSERTION OF MESH Left 06/18/2018  ? Procedure: INSERTION OF MESH;  Surgeon: Armandina Gemma, MD;  Location: [facility];  Service: General;  Laterality: Left;  ? LAPAROTOMY N/A 06/30/2018  ? Procedure: EXPLORATORY LAPAROTOMY LYSISIS OF ADHESIONS;  Surgeon: Jovita Kussmaul, MD;  Location: [facility];  Service: General;  Laterality: N/A;  ? PANCREAS SURGERY  2007  ? PERIPHERAL VASCULAR INTERVENTION  03/30/2018  ? Procedure: PERIPHERAL VASCULAR INTERVENTION;  Surgeon: Nigel Mormon, MD;  Location: [facility];  Service: Cardiovascular;;  Rt Iliac  ? UPPER GI ENDOSCOPY    ? [place]  ? VIDEO BRONCHOSCOPY WITH RADIAL ENDOBRONCHIAL ULTRASOUND  04/23/2021  ? Procedure: VIDEO BRONCHOSCOPY WITH RADIAL ENDOBRONCHIAL ULTRASOUND;  Surgeon: Garner Nash, DO;  Location: [facility];  Service: Pulmonary;;  ? ? ?Social History: ? reports that he has been smoking cigarettes. He has a 50.00 pack-year smoking history.  He has never used smokeless tobacco. He reports current alcohol use of about 6.0 standard drinks per week. He reports current drug use. Drug: Marijuana. ? ? ?Allergies  ?Allergen Reactions  ? Prevacid [Lansoprazole] Other (See Comments)  ?  Bloating ("became swollen with gas")  ? Varenicline Nausea And Vomiting and Other (See Comments)  ?  Vivid, bad dreams also  ? Ciprofloxacin Other (See Comments)  ?  GI Upset  ? Mirtazapine Other  (See Comments)  ?  Negatively affected sleep  ? Omeprazole Other (See Comments)  ?  "Gas"  ? ? ?Family History  ?Problem Relation Age of Onset  ? COPD Mother   ? CAD Mother   ? Diabetes Father   ? Cancer Brother   ?

## 2021-09-13 NOTE — ED Notes (Signed)
Patient noted to have decreasing oxygen saturations.  Patient was not on oxygen when RN went into room.  Patient reports baseline is 2 L O2 at home.   ?

## 2021-09-13 NOTE — Progress Notes (Addendum)
Crusting rash noted on pt right shoulder. Pt denies any pain at this time to site. Endorses itching to site. States he felt it was a bug bite. Additionally, pt significant other Donita (self reports to be a nurse) called nurses station to report that she felt pt has possible shingles. Reported to charge nurse and provider. Provider ordered VZV to rule out and pt to be evaluated further by provider in the morning. Provider states that it is acceptable to move pt to negative pressure room at this time for safety. ?

## 2021-09-13 NOTE — Assessment & Plan Note (Addendum)
Continue home dose of Toprol-XL.  Has had mildly uncontrolled and fluctuating blood pressures while hospitalized. ?Close outpatient follow-up with PCP and may consider adding an extension, may be amlodipine low-dose. ?

## 2021-09-13 NOTE — Progress Notes (Signed)
Pt arrived to room via stretcher from ED. Pt able to stand a pivot to bed. Placed on telemetry. Oriented to room/floor. Viewed video and demonstrates understanding. Pt able to voice concerns, denies any further needs at this time.  ?

## 2021-09-13 NOTE — Assessment & Plan Note (Addendum)
TTE December 2022 with LVEF 60 to 48%, grade 3 diastolic dysfunction, severe pulmonary artery hypertension ?Although BNP is up at 1811, clinically euvolemic so unclear significance of elevated BNP. ?Continue prior home dose of Lasix. ?

## 2021-09-13 NOTE — Assessment & Plan Note (Addendum)
Acute on chronic respiratory failure with hypoxia ?Reportedly underlying severe COPD.  Ongoing tobacco abuse ?Follows with Dr. Harless Nakayama pulmonology. ?Received IV Solu-Medrol 125 Mg x1, followed by IV Solu-Medrol 80 mg x 1 the next day.  However this precipitated marked hyperglycemia with CBG >500. ?Added Brovana and Pulmicort nebs ?Continued scheduled DuoNeb and as needed albuterol nebs. ?Complete course of Z-Pak. ?Flutter valve.  Clinically improved. ?Transitioned to oral prednisone 30 mg daily x3 days which she will complete tomorrow. ?Clinical bronchospasm has resolved.  Counseled extensively regarding tobacco cessation.  Outpatient follow-up with primary pulmonologist. ?

## 2021-09-14 DIAGNOSIS — R21 Rash and other nonspecific skin eruption: Secondary | ICD-10-CM

## 2021-09-14 DIAGNOSIS — Z720 Tobacco use: Secondary | ICD-10-CM

## 2021-09-14 DIAGNOSIS — E1165 Type 2 diabetes mellitus with hyperglycemia: Secondary | ICD-10-CM

## 2021-09-14 LAB — CBC WITH DIFFERENTIAL/PLATELET
Abs Immature Granulocytes: 0.1 10*3/uL — ABNORMAL HIGH (ref 0.00–0.07)
Basophils Absolute: 0 10*3/uL (ref 0.0–0.1)
Basophils Relative: 0 %
Eosinophils Absolute: 0 10*3/uL (ref 0.0–0.5)
Eosinophils Relative: 0 %
HCT: 33.4 % — ABNORMAL LOW (ref 39.0–52.0)
Hemoglobin: 11.4 g/dL — ABNORMAL LOW (ref 13.0–17.0)
Immature Granulocytes: 2 %
Lymphocytes Relative: 4 %
Lymphs Abs: 0.2 10*3/uL — ABNORMAL LOW (ref 0.7–4.0)
MCH: 31.6 pg (ref 26.0–34.0)
MCHC: 34.1 g/dL (ref 30.0–36.0)
MCV: 92.5 fL (ref 80.0–100.0)
Monocytes Absolute: 0.1 10*3/uL (ref 0.1–1.0)
Monocytes Relative: 1 %
Neutro Abs: 4.9 10*3/uL (ref 1.7–7.7)
Neutrophils Relative %: 93 %
Platelets: 286 10*3/uL (ref 150–400)
RBC: 3.61 MIL/uL — ABNORMAL LOW (ref 4.22–5.81)
RDW: 17.4 % — ABNORMAL HIGH (ref 11.5–15.5)
WBC: 5.3 10*3/uL (ref 4.0–10.5)
nRBC: 0 % (ref 0.0–0.2)

## 2021-09-14 LAB — MAGNESIUM: Magnesium: 1.8 mg/dL (ref 1.7–2.4)

## 2021-09-14 LAB — GLUCOSE, CAPILLARY
Glucose-Capillary: 302 mg/dL — ABNORMAL HIGH (ref 70–99)
Glucose-Capillary: 509 mg/dL (ref 70–99)
Glucose-Capillary: 488 mg/dL — ABNORMAL HIGH (ref 70–99)
Glucose-Capillary: 505 mg/dL (ref 70–99)
Glucose-Capillary: 466 mg/dL — ABNORMAL HIGH (ref 70–99)
Glucose-Capillary: 385 mg/dL — ABNORMAL HIGH (ref 70–99)
Glucose-Capillary: 372 mg/dL — ABNORMAL HIGH (ref 70–99)
Glucose-Capillary: 248 mg/dL — ABNORMAL HIGH (ref 70–99)
Glucose-Capillary: 166 mg/dL — ABNORMAL HIGH (ref 70–99)
Glucose-Capillary: 123 mg/dL — ABNORMAL HIGH (ref 70–99)
Glucose-Capillary: 111 mg/dL — ABNORMAL HIGH (ref 70–99)
Glucose-Capillary: 131 mg/dL — ABNORMAL HIGH (ref 70–99)

## 2021-09-14 LAB — BLOOD GAS, VENOUS
Acid-Base Excess: 6.1 mmol/L — ABNORMAL HIGH (ref 0.0–2.0)
Bicarbonate: 33 mmol/L — ABNORMAL HIGH (ref 20.0–28.0)
O2 Saturation: 19.5 %
Patient temperature: 37
pCO2, Ven: 57 mmHg (ref 44–60)
pH, Ven: 7.37 (ref 7.25–7.43)
pO2, Ven: <31 mmHg — CL (ref 32–45)

## 2021-09-14 LAB — COMPREHENSIVE METABOLIC PANEL
ALT: 18 U/L (ref 0–44)
AST: 30 U/L (ref 15–41)
Albumin: 2.8 g/dL — ABNORMAL LOW (ref 3.5–5.0)
Alkaline Phosphatase: 88 U/L (ref 38–126)
Anion gap: 7 (ref 5–15)
BUN: 49 mg/dL — ABNORMAL HIGH (ref 8–23)
CO2: 28 mmol/L (ref 22–32)
Calcium: 8.6 mg/dL — ABNORMAL LOW (ref 8.9–10.3)
Chloride: 90 mmol/L — ABNORMAL LOW (ref 98–111)
Creatinine, Ser: 0.92 mg/dL (ref 0.61–1.24)
GFR, Estimated: >60 mL/min (ref >60)
Glucose, Bld: 400 mg/dL — ABNORMAL HIGH (ref 70–99)
Potassium: 3.9 mmol/L (ref 3.5–5.1)
Sodium: 125 mmol/L — ABNORMAL LOW (ref 135–145)
Total Bilirubin: 0.1 mg/dL — ABNORMAL LOW (ref 0.3–1.2)
Total Protein: 6.8 g/dL (ref 6.5–8.1)

## 2021-09-14 LAB — HEMOGLOBIN A1C
Hgb A1c MFr Bld: 8.5 % — ABNORMAL HIGH (ref 4.8–5.6)
Mean Plasma Glucose: 197.25 mg/dL

## 2021-09-14 LAB — PHOSPHORUS: Phosphorus: 3.5 mg/dL (ref 2.5–4.6)

## 2021-09-14 MED ORDER — INSULIN REGULAR(HUMAN) IN NACL 100-0.9 UT/100ML-% IV SOLN
INTRAVENOUS | Status: DC
  Administered 2021-09-14: 3.4 [IU]/h via INTRAVENOUS
  Filled 2021-09-14 (×3): qty 100

## 2021-09-14 MED ORDER — INSULIN GLARGINE-YFGN 100 UNIT/ML ~~LOC~~ SOLN
10.0000 [IU] | SUBCUTANEOUS | Status: DC
  Administered 2021-09-14: 10 [IU] via SUBCUTANEOUS
  Filled 2021-09-14: qty 0.1

## 2021-09-14 MED ORDER — LACTATED RINGERS IV SOLN: INTRAVENOUS | Status: DC

## 2021-09-14 MED ORDER — ARFORMOTEROL TARTRATE 15 MCG/2ML IN NEBU
15.0000 ug | INHALATION_SOLUTION | Freq: Two times a day (BID) | RESPIRATORY_TRACT | Status: DC
  Administered 2021-09-14 – 2021-09-16 (×4): 15 ug via RESPIRATORY_TRACT
  Filled 2021-09-14 (×4): qty 2

## 2021-09-14 MED ORDER — DEXTROSE IN LACTATED RINGERS 5 % IV SOLN: INTRAVENOUS | Status: DC

## 2021-09-14 MED ORDER — LABETALOL HCL 5 MG/ML IV SOLN
10.0000 mg | INTRAVENOUS | Status: DC | PRN
  Administered 2021-09-15 – 2021-09-16 (×4): 10 mg via INTRAVENOUS
  Filled 2021-09-14 (×3): qty 4

## 2021-09-14 MED ORDER — DEXTROSE 50 % IV SOLN
0.0000 mL | INTRAVENOUS | Status: DC | PRN
  Filled 2021-09-14: qty 50

## 2021-09-14 MED ORDER — METHYLPREDNISOLONE SODIUM SUCC 40 MG IJ SOLR: 40.0000 mg | Freq: Every day | INTRAMUSCULAR | Status: DC

## 2021-09-14 MED ORDER — CHLORHEXIDINE GLUCONATE CLOTH 2 % EX PADS
6.0000 | MEDICATED_PAD | Freq: Every day | CUTANEOUS | Status: DC
  Administered 2021-09-14 – 2021-09-15 (×2): 6 via TOPICAL

## 2021-09-14 MED ORDER — INSULIN ASPART 100 UNIT/ML IJ SOLN: 0.0000 [IU] | Freq: Three times a day (TID) | INTRAMUSCULAR | Status: DC

## 2021-09-14 MED ORDER — IPRATROPIUM-ALBUTEROL 0.5-2.5 (3) MG/3ML IN SOLN
3.0000 mL | Freq: Two times a day (BID) | RESPIRATORY_TRACT | Status: DC
  Administered 2021-09-15 – 2021-09-16 (×3): 3 mL via RESPIRATORY_TRACT
  Filled 2021-09-14 (×3): qty 3

## 2021-09-14 MED ORDER — AZITHROMYCIN 250 MG PO TABS
250.0000 mg | ORAL_TABLET | Freq: Every day | ORAL | Status: DC
  Administered 2021-09-14 – 2021-09-15 (×2): 250 mg via ORAL
  Filled 2021-09-14 (×2): qty 1

## 2021-09-14 MED ORDER — INSULIN ASPART 100 UNIT/ML IJ SOLN
0.0000 [IU] | INTRAMUSCULAR | Status: DC
  Administered 2021-09-15: 5 [IU] via SUBCUTANEOUS

## 2021-09-14 MED ORDER — BUDESONIDE 0.25 MG/2ML IN SUSP
0.2500 mg | Freq: Two times a day (BID) | RESPIRATORY_TRACT | Status: DC
  Administered 2021-09-14 – 2021-09-16 (×4): 0.25 mg via RESPIRATORY_TRACT
  Filled 2021-09-14 (×4): qty 2

## 2021-09-14 MED ORDER — HYDROCORTISONE 1 % EX CREA
TOPICAL_CREAM | Freq: Two times a day (BID) | CUTANEOUS | Status: DC
  Filled 2021-09-14 (×2): qty 28

## 2021-09-14 NOTE — OR Nursing (Signed)
?  Pt. Blood glucose is 509. MD Hongalgi was notified. Pt. Is alert and oriented, asymptomatic. New order for insulin drip placed, pt. Was transferred to ICU for close monitoring while on insulin drip.  ?

## 2021-09-14 NOTE — Progress Notes (Signed)
Report given to [place], Therapist, sports. Pt transferred to 1441. Pt denies any further needs at this time.  ?

## 2021-09-14 NOTE — TOC Initial Note (Signed)
Transition of Care (TOC) - Initial/Assessment Note  ? ? ?Patient Details  ?Name: [name] ?MRN: [id number] ?Date of Birth: [birth year] ? ?Transition of Care (TOC) CM/SW Contact:    ?Tawanna Cooler, RN ?Phone Number: ?09/14/2021, 2:28 PM ? ?Clinical Narrative:                 ? ?Patient lives with girlfriend.  Already has home O2.   ?TOC following for discharge needs.  ? ? ?Expected Discharge Plan: Home/Self Care ?Barriers to Discharge: Continued Medical Work up ? ? ?Expected Discharge Plan and Services ?Expected Discharge Plan: Home/Self Care ?   ?  ?Living arrangements for the past 2 months: [place] ?                ?  ?Prior Living Arrangements/Services ?Living arrangements for the past 2 months: [place] ?Lives with:: Significant Other ?Patient language and need for interpreter reviewed:: Yes ?       ?Need for Family Participation in Patient Care: Yes (Comment) ?Care giver support system in place?: Yes (comment) ?  ?Criminal Activity/Legal Involvement Pertinent to Current Situation/Hospitalization: No - Comment as needed ? ?Activities of Daily Living ?Home Assistive Devices/Equipment: Eyeglasses, Oxygen, Nebulizer, Cane (specify quad or straight) (freebreeze) ?ADL Screening (condition at time of admission) ?Patient's cognitive ability adequate to safely complete daily activities?: Yes ?Is the patient deaf or have difficulty hearing?: No ?Does the patient have difficulty seeing, even when wearing glasses/contacts?: No ?Does the patient have difficulty concentrating, remembering, or making decisions?: No ?Patient able to express need for assistance with ADLs?: Yes ?Does the patient have difficulty dressing or bathing?: No ?Independently performs ADLs?: Yes (appropriate for developmental age) ?Does the patient have difficulty walking or climbing stairs?: Yes (gets sob) ?Weakness of Legs: None ?Weakness of Arms/Hands: None ? ?Emotional Assessment ?   ?Orientation: : Oriented to Self,  Oriented to Place, Oriented to  Time, Oriented to Situation ?Alcohol / Substance Use: Not Applicable ?Psych Involvement: No (comment) ? ?Admission diagnosis:  Acute exacerbation of chronic obstructive pulmonary disease (COPD) ([place]) [J44.1] ?COPD exacerbation ([place]) [J44.1] ?Patient Active Problem List  ? Diagnosis Date Noted  ? Uncontrolled type 2 diabetes mellitus with hyperglycemia, without long-term current use of insulin ([place]) 09/14/2021  ? Tobacco abuse 09/14/2021  ? Skin rash 09/14/2021  ? Acute exacerbation of chronic obstructive pulmonary disease (COPD) ([place]) 09/13/2021  ? Chronic diastolic CHF (congestive heart failure) ([place]) 09/13/2021  ? Pulmonary hypertension ([place]) 07/09/2021  ? NSCLC of left lung ([place]) 07/09/2021  ? NSCLC of right lung ([place]) 07/09/2021  ? Acute on chronic respiratory failure with hypoxia ([place]) 07/09/2021  ? Cancer of lower lobe of left lung ([place]) 07/04/2021  ? Pulmonary embolism ([place]) 05/11/2021  ? Lung cancer ([place]) 05/11/2021  ? Acute respiratory failure with hypoxia and hypercarbia ([place]) 05/10/2021  ? Lung nodules   ? Nodule of lower lobe of left lung 01/22/2021  ? Diverticulosis 01/31/2019  ? Memory loss 10/11/2018  ? Abnormal brain MRI 10/11/2018  ? Alcoholism ([place]) 07/26/2018  ? On total parenteral nutrition (TPN)   ? Inguinal hernia of left side with obstruction and without gangrene   ? Acute hypoxemic respiratory failure ([place])   ? SBO (small bowel obstruction) ([place])   ? PAD (peripheral artery disease) ([place])   ? Incarcerated left inguinal hernia s/p repair 06/18/2018 06/14/2018  ? Severe claudication ([place]) 03/28/2018  ? Primary cancer of right upper lobe of lung ([place]) 11/30/2017  ? Internal hemorrhoids 09/17/2017  ?  Hyponatremia 01/05/2015  ? Hypokalemia 01/05/2015  ? COPD, severe ([place]) 01/05/2015  ? Essential hypertension 01/05/2015  ? Protein-calorie malnutrition, severe ([place]) 01/05/2015  ? Pneumothorax of left lung after biopsy 01/04/2015  ? Former smoker 01/04/2015  ?  Pneumothorax, traumatic   ? Cancer of upper lobe of left lung ([place]) 12/21/2014  ? Pancreatic mass 11/23/2014  ? ?PCP:  Janie Morning, DO ?Pharmacy:   ?[place] [address] ?[facility] [place] [address] ?Phone: [phone] Fax: [phone] ? ? ? ? ? ? ?

## 2021-09-14 NOTE — Assessment & Plan Note (Addendum)
Right upper back, pruritic. ?On admission, some concern for shingles and was placed on negative pressure isolation room. ?Reviewed and clinically does not appear to be shingles.  Also no open wounds or blisters ?Reviewed case and picture with infectious disease MD on-call on 4/8 and agree that this is likely not shingles. ?Topical HC cream and monitor.  Discontinued negative pressure isolation. ?Improving. ?

## 2021-09-14 NOTE — Hospital Course (Addendum)
71 year old male, lives with his girlfriend, independent, chronic respiratory failure with hypoxia on home oxygen 2 L/min continuously via nasal cannula, severe COPD, chronic diastolic CHF, severe pulmonary hypertension, essential hypertension, acute pulmonary embolism in December 2022 anticoagulated on Xarelto, chronic hyponatremia with baseline serum sodium 131-133, LUL adenocarcinoma s/p stereotactic radiation, non-small cell lung cancer RUL s/p SBRT, presented to the ED with 2 days of progressive dyspnea, wheezing and nonproductive cough worse than baseline.  In the ED tachypneic in the 20s, mildly hypertensive, hypoxic to 86% on baseline nasal cannula oxygen 2 L/min which was increased to 4 L with improvement.  In ED received Solu-Medrol 125 mg IV x1.  Admitted for acute on chronic respiratory failure with hypoxia due to COPD exacerbation.  Course complicated by marked hyperglycemia due to steroids.  Transferred to stepdown unit for IV insulin drip.  Transitioned off of insulin drip to subcutaneous insulin overnight on 4/8, developed severe hypoglycemia requiring IV D50 resuscitation.  Clinically improved.   ?

## 2021-09-14 NOTE — Progress Notes (Addendum)
?PROGRESS NOTE ?  ?[name]  FXT:[id number]    DOB: 1951-06-04    DOA: 09/13/2021 ? ?PCP: Janie Morning, DO  ? ?I have briefly reviewed patients previous medical [facility]. ? ?Chief Complaint  ?Patient presents with  ? Shortness of Breath  ? ? ?Hospital Course:  ?71 year old male, lives with his girlfriend, independent, chronic respiratory failure with hypoxia on home oxygen 2 L/min continuously via nasal cannula, severe COPD, chronic diastolic CHF, severe pulmonary hypertension, essential hypertension, acute pulmonary embolism in December 2022 anticoagulated on Xarelto, chronic hyponatremia with baseline serum sodium 131-133, LUL adenocarcinoma s/p stereotactic radiation, non-small cell lung cancer RUL s/p SBRT, presented to the ED with 2 days of progressive dyspnea, wheezing and nonproductive cough worse than baseline.  In the ED tachypneic in the 20s, mildly hypertensive, hypoxic to 86% on baseline nasal cannula oxygen 2 L/min which was increased to 4 L with improvement.  In ED received Solu-Medrol 125 mg IV x1.  Admitted for acute on chronic respiratory failure with hypoxia due to COPD exacerbation.  Course complicated by marked hyperglycemia due to steroids.  Transferred to stepdown unit for IV insulin drip. ? ?Assessment and Plan: ?* Acute exacerbation of chronic obstructive pulmonary disease (COPD) ([place]) ?Acute on chronic respiratory failure with hypoxia ?Reportedly underlying severe COPD.  Ongoing tobacco abuse ?Follows with Dr. Harless Nakayama pulmonology. ?Received IV Solu-Medrol 125 Mg x1, followed by IV Solu-Medrol 80 mg x 1 this morning.  However this precipitated marked hyperglycemia with CBG >500. ?Given clinical improvement, reduced IV Solu-Medrol to 40 mg daily from tomorrow. ?Added Brovana and Pulmicort nebs ?Continue scheduled DuoNeb and as needed albuterol nebs. ?Complete course of Z-Pak. ?Flutter valve.  Clinically improved. ? ?Acute on chronic respiratory failure with  hypoxia (HCC) ?At baseline patient on 2 L/min home oxygen. ?He was hypoxic in the ED in the mid 80s on his home oxygen. ?Currently on 3 L/min and saturating in the upper 90s, wean as tolerated to maintain oxygen saturation between 89-92%. ?As per RT, patient uses BiPAP at bedtime, continue ? ?Uncontrolled type 2 diabetes mellitus with hyperglycemia, without long-term current use of insulin ([place]) ?A1c 8.5 on 09/14/2021. ?CBGs up since admission from 302 > 509 ?Likely precipitated by high-dose IV steroids.  Reduced IV steroids. ?Transferred to stepdown until glycemic control has improved. ?Initiated IV insulin per protocol with frequent CBG monitoring. ?Once controlled, transition to maintenance dose Semglee and SSI while hospitalized and need to determine the regimen for DC. ?Diabetes coordinator consulted. ?Diabetic diet. ? ?Chronic diastolic CHF (congestive heart failure) ([place]) ?TTE December 2022 with LVEF 60 to 29%, grade 3 diastolic dysfunction, severe pulmonary artery hypertension ?Although BNP is up at 1811, clinically euvolemic so unclear significance of elevated BNP. ?Continue prior home dose of Lasix. ? ?Essential hypertension ?Mildly uncontrolled.  Continue home dose of Toprol-XL.  Could consider as needed IV hydralazine. ? ?Pulmonary embolism ([place]) ?Recently diagnosed in December 2022. ?Continue Xarelto. ?As discussed with pharmacy, Megace can increase hyperviscosity and hence will change to Remeron for appetite. ?Low index of suspicion for recurrent PE given improvement with treatment for COPD. ? ?Hyponatremia ?Baseline serum sodium 130-133. ?Current serum sodium of 125 corrects to 132 for hyperglycemia 400. ? ?Tobacco abuse ?Cessation counseled. ?Reports infrequent use may be couple days per week. ? ?Skin rash ?Right upper back, pruritic. ?On admission, some concern for shingles and was placed on negative pressure isolation room. ?Reviewed and clinically does not appear to be shingles.  Also no open wounds  or blisters ?Reviewed case and picture with infectious disease MD on-call and agree that this is likely not shingles. ?Topical HC cream and monitor.  Discontinued negative pressure isolation. ? ? ?Body mass index is 15.5 kg/m?. ? ? ?DVT prophylaxis: SCDs Start: 09/13/21 1918   ?  Code Status: DNR:  ?Family Communication: None at bedside ?Disposition:  ?Status is: Inpatient ?Remains inpatient appropriate because: IV meds for COPD, insulin drip for marked hyperglycemia ?  ? ?Consultants:   ?None ? ?Procedures:   ?None ? ?Antimicrobials:   ?Azithromycin ? ? ?Subjective:  ?Overall feels much better.  Dyspnea significantly improved and feels back to his baseline.  No wheezing.  Chronic cough which has not changed.  States that his girlfriend found the rash on the right upper back about 6 days ago when he noted pruritus.  Denies bug bites. ? ?Objective:  ? ?Vitals:  ? 09/14/21 0513 09/14/21 0804 09/14/21 1400 09/14/21 1537  ?BP: (!) 162/90  (!) 175/93   ?Pulse: 76     ?Resp: 16  18 19   ?Temp: 97.8 ?F (36.6 ?C)     ?TempSrc: Oral     ?SpO2: 100% 91% 100% 100%  ?Weight:      ?Height:      ? ? ?General exam: Elderly male, small build, frail, cachectic and chronically ill looking sitting up comfortably at edge of bed without distress. ?Respiratory system: Distant breath sounds but otherwise clear to auscultation without wheezing, rhonchi or crackles.  No increased work of breathing.  Able to speak in full sentences. ?Cardiovascular system: S1 & S2 heard, RRR. No JVD, murmurs, rubs, gallops or clicks. No pedal edema.  Telemetry personally reviewed: Sinus rhythm. ?Gastrointestinal system: Abdomen is nondistended, soft and nontender. No organomegaly or masses felt. Normal bowel sounds heard. ?Central nervous system: Alert and oriented. No focal neurological deficits. ?Extremities: Symmetric 5 x 5 power. ?Skin: Right upper back macular mildly erythematous rash with pruritus.  No vesicles or open wounds appreciated.  Rest as per  picture below taken on 4/8. ?Psychiatry: Judgement and insight appear normal. Mood & affect appropriate.  ? ? ? ? ?Data Reviewed:   ?I have personally reviewed following labs and imaging studies ? ?CBC: ?Recent Labs  ?Lab 09/13/21 ?2725 09/14/21 ?0412  ?WBC 9.7 5.3  ?NEUTROABS 7.6 4.9  ?HGB 12.5* 11.4*  ?HCT 37.5* 33.4*  ?MCV 92.1 92.5  ?PLT 324 286  ? ?Basic Metabolic Panel: ?Recent Labs  ?Lab 09/13/21 ?3664 09/14/21 ?0412  ?NA 131* 125*  ?K 4.3 3.9  ?CL 97* 90*  ?CO2 27 28  ?GLUCOSE 125* 400*  ?BUN 43* 49*  ?CREATININE 0.85 0.92  ?CALCIUM 8.8* 8.6*  ?MG 1.8 1.8  ?PHOS  --  3.5  ? ?Liver Function Tests: ?Recent Labs  ?Lab 09/14/21 ?0412  ?AST 30  ?ALT 18  ?ALKPHOS 88  ?BILITOT 0.1*  ?PROT 6.8  ?ALBUMIN 2.8*  ? ?CBG: ?Recent Labs  ?Lab 09/14/21 ?1428 09/14/21 ?1507 09/14/21 ?1541  ?GLUCAP 488* 466* 385*  ? ?Microbiology Studies:  ? ?Recent Results (from the past 240 hour(s))  ?Resp Panel by RT-PCR (Flu A&B, Covid) Nasopharyngeal Swab     Status: None  ? Collection Time: 09/13/21  4:45 PM  ? Specimen: Nasopharyngeal Swab; Nasopharyngeal(NP) swabs in vial transport medium  ?Result Value Ref Range Status  ? SARS Coronavirus 2 by RT PCR NEGATIVE NEGATIVE Final  ?  Comment: (NOTE) ?SARS-CoV-2 target nucleic acids are NOT DETECTED. ? ?The SARS-CoV-2 RNA is generally detectable in upper respiratory ?  specimens during the acute phase of infection. The lowest ?concentration of SARS-CoV-2 viral copies this assay can detect is ?138 copies/mL. A negative result does not preclude SARS-Cov-2 ?infection and should not be used as the sole basis for treatment or ?other patient management decisions. A negative result may occur with  ?improper specimen collection/handling, submission of specimen other ?than nasopharyngeal swab, presence of viral mutation(s) within the ?areas targeted by this assay, and inadequate number of viral ?copies(<138 copies/mL). A negative result must be combined with ?clinical observations, patient history,  and epidemiological ?information. The expected result is Negative. ? ?Fact Sheet for Patients:  ?EntrepreneurPulse.com.au ? ?Fact Sheet for Healthcare Providers:  ?TattooLocations.ca

## 2021-09-14 NOTE — Assessment & Plan Note (Addendum)
A1c 8.5 on 09/14/2021. ?CBGs up since admission from 302 > 509 on 4/8 ?Likely precipitated by high-dose IV steroids.  Discontinued IV steroids. ?Patient was transferred to stepdown and managed with insulin drip per protocol with improvement in his glycemic control. ?Received Semglee 10 units at 10:40 PM, insulin drip discontinued at 12:30 AM, 5 units of NovoLog SSI at 12:40 AM, then developed severe hypoglycemia with CBGs in 12 > 16 requiring IV D50 resuscitation. ?Diabetes coordinator input appreciated.  Discontinued Semglee and changed SSI to very sensitive.  ?CBGs better controlled with occasional fluctuations.  Diabetes coordinator has met with patient, educated patient and recommends discharging on metformin. ?Close outpatient follow-up with PCP for further management. ?

## 2021-09-14 NOTE — Assessment & Plan Note (Signed)
Cessation counseled. ?Reports infrequent use may be couple days per week. ?

## 2021-09-15 LAB — CBC
HCT: 37.4 % — ABNORMAL LOW (ref 39.0–52.0)
Hemoglobin: 12.8 g/dL — ABNORMAL LOW (ref 13.0–17.0)
MCH: 30.3 pg (ref 26.0–34.0)
MCHC: 34.2 g/dL (ref 30.0–36.0)
MCV: 88.6 fL (ref 80.0–100.0)
Platelets: 283 10*3/uL (ref 150–400)
RBC: 4.22 MIL/uL (ref 4.22–5.81)
RDW: 17 % — ABNORMAL HIGH (ref 11.5–15.5)
WBC: 8.1 10*3/uL (ref 4.0–10.5)
nRBC: 0 % (ref 0.0–0.2)

## 2021-09-15 LAB — BLOOD GAS, VENOUS
Acid-Base Excess: 10.4 mmol/L — ABNORMAL HIGH (ref 0.0–2.0)
Bicarbonate: 38.1 mmol/L — ABNORMAL HIGH (ref 20.0–28.0)
O2 Saturation: 17.1 %
Patient temperature: 36.8
pCO2, Ven: 62 mmHg — ABNORMAL HIGH (ref 44–60)
pH, Ven: 7.39 (ref 7.25–7.43)
pO2, Ven: <31 mmHg — CL (ref 32–45)

## 2021-09-15 LAB — GLUCOSE, CAPILLARY
Glucose-Capillary: 106 mg/dL — ABNORMAL HIGH (ref 70–99)
Glucose-Capillary: 113 mg/dL — ABNORMAL HIGH (ref 70–99)
Glucose-Capillary: 12 mg/dL — CL (ref 70–99)
Glucose-Capillary: 16 mg/dL — CL (ref 70–99)
Glucose-Capillary: 168 mg/dL — ABNORMAL HIGH (ref 70–99)
Glucose-Capillary: 176 mg/dL — ABNORMAL HIGH (ref 70–99)
Glucose-Capillary: 217 mg/dL — ABNORMAL HIGH (ref 70–99)
Glucose-Capillary: 231 mg/dL — ABNORMAL HIGH (ref 70–99)
Glucose-Capillary: 245 mg/dL — ABNORMAL HIGH (ref 70–99)
Glucose-Capillary: 376 mg/dL — ABNORMAL HIGH (ref 70–99)
Glucose-Capillary: 449 mg/dL — ABNORMAL HIGH (ref 70–99)
Glucose-Capillary: 79 mg/dL (ref 70–99)
Glucose-Capillary: 91 mg/dL (ref 70–99)
Glucose-Capillary: 97 mg/dL (ref 70–99)

## 2021-09-15 LAB — BASIC METABOLIC PANEL
Anion gap: 9 (ref 5–15)
BUN: 45 mg/dL — ABNORMAL HIGH (ref 8–23)
CO2: 27 mmol/L (ref 22–32)
Calcium: 9 mg/dL (ref 8.9–10.3)
Chloride: 93 mmol/L — ABNORMAL LOW (ref 98–111)
Creatinine, Ser: 0.85 mg/dL (ref 0.61–1.24)
GFR, Estimated: >60 mL/min (ref >60)
Glucose, Bld: 27 mg/dL — CL (ref 70–99)
Potassium: 3.3 mmol/L — ABNORMAL LOW (ref 3.5–5.1)
Sodium: 129 mmol/L — ABNORMAL LOW (ref 135–145)

## 2021-09-15 MED ORDER — INSULIN ASPART 100 UNIT/ML IJ SOLN: 0.0000 [IU] | Freq: Three times a day (TID) | INTRAMUSCULAR | Status: DC

## 2021-09-15 MED ORDER — PREDNISONE 20 MG PO TABS
30.0000 mg | ORAL_TABLET | Freq: Every day | ORAL | Status: DC
  Administered 2021-09-15 – 2021-09-16 (×2): 30 mg via ORAL
  Filled 2021-09-15 (×2): qty 1

## 2021-09-15 MED ORDER — POTASSIUM CHLORIDE CRYS ER 20 MEQ PO TBCR
40.0000 meq | EXTENDED_RELEASE_TABLET | Freq: Once | ORAL | Status: AC
  Administered 2021-09-15: 40 meq via ORAL
  Filled 2021-09-15: qty 2

## 2021-09-15 MED ORDER — GLUCERNA SHAKE PO LIQD
237.0000 mL | Freq: Three times a day (TID) | ORAL | Status: DC
Start: 2021-09-15 — End: 2021-09-16
  Administered 2021-09-15 – 2021-09-16 (×3): 237 mL via ORAL
  Filled 2021-09-15 (×6): qty 237

## 2021-09-15 MED ORDER — INSULIN ASPART 100 UNIT/ML IJ SOLN
0.0000 [IU] | INTRAMUSCULAR | Status: DC
  Administered 2021-09-15: 6 [IU] via SUBCUTANEOUS
  Administered 2021-09-16: 3 [IU] via SUBCUTANEOUS
  Administered 2021-09-16: 4 [IU] via SUBCUTANEOUS

## 2021-09-15 MED ORDER — DEXTROSE 50 % IV SOLN
25.0000 g | INTRAVENOUS | Status: AC
  Administered 2021-09-15: 25 g via INTRAVENOUS

## 2021-09-15 MED ORDER — INSULIN ASPART 100 UNIT/ML IJ SOLN
0.0000 [IU] | Freq: Three times a day (TID) | INTRAMUSCULAR | Status: DC
  Administered 2021-09-15: 2 [IU] via SUBCUTANEOUS

## 2021-09-15 MED ORDER — POTASSIUM CHLORIDE CRYS ER 10 MEQ PO TBCR
10.0000 meq | EXTENDED_RELEASE_TABLET | Freq: Every day | ORAL | Status: DC
  Administered 2021-09-16: 10 meq via ORAL
  Filled 2021-09-15: qty 1

## 2021-09-15 NOTE — Assessment & Plan Note (Addendum)
Replaced. °

## 2021-09-15 NOTE — Progress Notes (Signed)
Hypoglycemic Event ? ?CBG: 12 ? ?Treatment: D50 50 mL (25 gm) ? ?Symptoms: Sweaty, Shaky, and altered Mental Status  ? ?Follow-up CBG: [id number] CBG Result:245 ? ?Possible Reasons for Event: Medication regimen:   ? ?Comments/MD notified: NP [place] notified  ? ?Patient's mentation now improving and blood sugar now 245  ? ?[name] ? ? ?

## 2021-09-15 NOTE — Progress Notes (Signed)
Date and time results received: 09/15/21 0406 ? ? ?Test: Blood glucose  ?Critical Value: 27 ? ?Name of Provider Notified: Stark Klein, NP  ? ?25gm of D50 given.  ?

## 2021-09-15 NOTE — Progress Notes (Signed)
Pt refused bipap tonight. Resting well, no resp distress noted. Machine remained bedside if needed.  ?

## 2021-09-15 NOTE — Assessment & Plan Note (Signed)
History of same. ?Multifactorial ?Nutritional supplements. ?

## 2021-09-15 NOTE — Progress Notes (Deleted)
Wife refuses bipap at this time due to pts lunch has just been ordered. RN notified ?

## 2021-09-15 NOTE — Progress Notes (Signed)
?PROGRESS NOTE ?  ?[name]  SVX:[id number]    DOB: [birth year]    DOA: 09/13/2021 ? ?PCP: Janie Morning, DO  ? ?I have briefly reviewed patients previous medical records in [facility]. ? ?Chief Complaint  ?Patient presents with  ? Shortness of Breath  ? ? ?Hospital Course:  ?71 year old male, lives with his girlfriend, independent, chronic respiratory failure with hypoxia on home oxygen 2 L/min continuously via nasal cannula, severe COPD, chronic diastolic CHF, severe pulmonary hypertension, essential hypertension, acute pulmonary embolism in December 2022 anticoagulated on Xarelto, chronic hyponatremia with baseline serum sodium 131-133, LUL adenocarcinoma s/p stereotactic radiation, non-small cell lung cancer RUL s/p SBRT, presented to the ED with 2 days of progressive dyspnea, wheezing and nonproductive cough worse than baseline.  In the ED tachypneic in the 20s, mildly hypertensive, hypoxic to 86% on baseline nasal cannula oxygen 2 L/min which was increased to 4 L with improvement.  In ED received Solu-Medrol 125 mg IV x1.  Admitted for acute on chronic respiratory failure with hypoxia due to COPD exacerbation.  Course complicated by marked hyperglycemia due to steroids.  Transferred to stepdown unit for IV insulin drip.  Transitioned off of insulin drip to subcutaneous insulin overnight on 4/8, developed severe hypoglycemia requiring IV D50 resuscitation.  Clinically improved.  Transferring out of stepdown unit to medical floor. ? ?Assessment and Plan: ?* Acute exacerbation of chronic obstructive pulmonary disease (COPD) ([place]) ?Acute on chronic respiratory failure with hypoxia ?Reportedly underlying severe COPD.  Ongoing tobacco abuse ?Follows with Dr. Harless Nakayama pulmonology. ?Received IV Solu-Medrol 125 Mg x1, followed by IV Solu-Medrol 80 mg x 1 this morning.  However this precipitated marked hyperglycemia with CBG >500. ?Added Brovana and Pulmicort nebs ?Continue scheduled DuoNeb and as needed  albuterol nebs. ?Complete course of Z-Pak. ?Flutter valve.  Clinically improved. ?Transitioned to oral prednisone 30 mg daily x3 days. ? ?Acute on chronic respiratory failure with hypoxia (HCC) ?At baseline patient on 2 L/min home oxygen. ?He was hypoxic in the ED in the mid 80s on his home oxygen. ?As per RT, patient uses BiPAP at bedtime, continue.  Patient refused BiPAP overnight. ?This morning noted to be saturating in the mid to high 90s on room air, had taken off his nasal cannula oxygen for an extended period of time. ? ?Uncontrolled type 2 diabetes mellitus with hyperglycemia, without long-term current use of insulin ([place]) ?A1c 8.5 on 09/14/2021. ?CBGs up since admission from 302 > 509 on 4/8 ?Likely precipitated by high-dose IV steroids.  Discontinued IV steroids. ?Patient was transferred to stepdown and managed with insulin drip per protocol with improvement in his glycemic control. ?Received Semglee 10 units at 10:40 PM, insulin drip discontinued at 12:30 AM, 5 units of NovoLog SSI at 12:40 AM, then developed severe hypoglycemia with CBGs in 12 > 16 requiring IV D50 resuscitation. ?Diabetes coordinator input appreciated.  Discontinued Semglee and changed SSI to very sensitive.  Monitor closely. ? ?Chronic diastolic CHF (congestive heart failure) ([place]) ?TTE December 2022 with LVEF 60 to 23%, grade 3 diastolic dysfunction, severe pulmonary artery hypertension ?Although BNP is up at 1811, clinically euvolemic so unclear significance of elevated BNP. ?Continue prior home dose of Lasix. ? ?Essential hypertension ?Mildly uncontrolled.  Continue home dose of Toprol-XL.  Could consider as needed IV hydralazine. ? ?Pulmonary embolism ([place]) ?Recently diagnosed in December 2022. ?Continue Xarelto. ?As discussed with pharmacy, Megace can increase hyperviscosity and hence will change to Remeron for appetite. ?Low index of suspicion for recurrent PE  given improvement with treatment for COPD. ? ?Hyponatremia ?Baseline  serum sodium 130-133. ?Serum sodium 129, almost at baseline. ? ?Tobacco abuse ?Cessation counseled. ?Reports infrequent use may be couple days per week. ? ?Skin rash ?Right upper back, pruritic. ?On admission, some concern for shingles and was placed on negative pressure isolation room. ?Reviewed and clinically does not appear to be shingles.  Also no open wounds or blisters ?Reviewed case and picture with infectious disease MD on-call on 4/8 and agree that this is likely not shingles. ?Topical HC cream and monitor.  Discontinued negative pressure isolation. ?Improving. ? ?Protein-calorie malnutrition, severe ([place]) ?History of same. ?Multifactorial ?Nutritional supplements. ? ?Hypokalemia ?Replace and follow. ? ? ?Body mass index is 15.5 kg/m?. ? ? ?DVT prophylaxis: SCDs Start: 09/13/21 1918   ?  Code Status: DNR:  ?Family Communication: None at bedside ?Disposition:  ?Home pending clinical improvement, possibly in 1 to 2 days. ?  ? ?Consultants:   ?None ? ?Procedures:   ?None ? ?Antimicrobials:   ?Azithromycin ? ? ?Subjective:  ?Overnight events noted, hypoglycemia.  Sleeping soundly this morning.  Briefly opens eyes and denies complaints.  Oxygen nasal cannula off, saturating in the mid to high 90s on room air. ?Objective:  ? ?Vitals:  ? 09/15/21 0700 09/15/21 0800 09/15/21 0852 09/15/21 0900  ?BP: (!) 160/98 (!) 187/106  (!) 149/78  ?Pulse: 63 74  70  ?Resp: 14 13  18   ?Temp:   (!) 97.4 ?F (36.3 ?C)   ?TempSrc:   Oral   ?SpO2: 93% 98%  97%  ?Weight:      ?Height:      ? ? ?General exam: Elderly male, small build, frail, cachectic and chronically ill looking lying comfortably crouched up in bed without distress. ?Respiratory system: Distant breath sounds but otherwise clear to auscultation without wheezing, rhonchi or crackles.  No increased work of breathing.  ?Cardiovascular system: S1 & S2 heard, RRR. No JVD, murmurs, rubs, gallops or clicks. No pedal edema.  Telemetry personally reviewed: Sinus  rhythm. ?Gastrointestinal system: Abdomen is nondistended, soft and nontender. No organomegaly or masses felt. Normal bowel sounds heard. ?Central nervous system: Sleeping but arousable and appropriate. No focal neurological deficits. ?Extremities: Symmetric 5 x 5 power. ?Skin: Right upper back macular mildly erythematous rash with pruritus.  No vesicles or open wounds appreciated.  Rest as per picture below taken on 4/8.  Improved compared to yesterday. ?Psychiatry: Judgement and insight appear normal. Mood & affect appropriate.  ? ? ? ? ?Data Reviewed:   ?I have personally reviewed following labs and imaging studies ? ?CBC: ?Recent Labs  ?Lab 09/13/21 ?4401 09/14/21 ?0412 09/15/21 ?0309  ?WBC 9.7 5.3 8.1  ?NEUTROABS 7.6 4.9  --   ?HGB 12.5* 11.4* 12.8*  ?HCT 37.5* 33.4* 37.4*  ?MCV 92.1 92.5 88.6  ?PLT 324 286 283  ? ?Basic Metabolic Panel: ?Recent Labs  ?Lab 09/13/21 ?0272 09/14/21 ?0412 09/15/21 ?0309  ?NA 131* 125* 129*  ?K 4.3 3.9 3.3*  ?CL 97* 90* 93*  ?CO2 27 28 27   ?GLUCOSE 125* 400* 27*  ?BUN 43* 49* 45*  ?CREATININE 0.85 0.92 0.85  ?CALCIUM 8.8* 8.6* 9.0  ?MG 1.8 1.8  --   ?PHOS  --  3.5  --   ? ?Liver Function Tests: ?Recent Labs  ?Lab 09/14/21 ?0412  ?AST 30  ?ALT 18  ?ALKPHOS 88  ?BILITOT 0.1*  ?PROT 6.8  ?ALBUMIN 2.8*  ? ?CBG: ?Recent Labs  ?Lab 09/15/21 ?5366 09/15/21 ?0803 09/15/21 ?1001  ?GLUCAP  106* 91 97  ? ?Microbiology Studies:  ? ?Recent Results (from the past 240 hour(s))  ?Resp Panel by RT-PCR (Flu A&B, Covid) Nasopharyngeal Swab     Status: None  ? Collection Time: 09/13/21  4:45 PM  ? Specimen: Nasopharyngeal Swab; Nasopharyngeal(NP) swabs in vial transport medium  ?Result Value Ref Range Status  ? SARS Coronavirus 2 by RT PCR NEGATIVE NEGATIVE Final  ?  Comment: (NOTE) ?SARS-CoV-2 target nucleic acids are NOT DETECTED. ? ?The SARS-CoV-2 RNA is generally detectable in upper respiratory ?specimens during the acute phase of infection. The lowest ?concentration of SARS-CoV-2 viral copies this  assay can detect is ?138 copies/mL. A negative result does not preclude SARS-Cov-2 ?infection and should not be used as the sole basis for treatment or ?other patient management decisions. A negative result may occur

## 2021-09-15 NOTE — Progress Notes (Signed)
Inpatient Diabetes Program Recommendations ? ?AACE/ADA: New Consensus Statement on Inpatient Glycemic Control (2015) ? ?Target Ranges:  Prepandial:   less than 140 mg/dL ?     Peak postprandial:   less than 180 mg/dL (1-2 hours) ?     Critically ill patients:  140 - 180 mg/dL  ? ?Lab Results  ?Component Value Date  ? GLUCAP 91 09/15/2021  ? HGBA1C 8.5 (H) 09/14/2021  ? ? ?Review of Glycemic Control ? Latest Reference Range & Units 09/15/21 03:56 09/15/21 03:57  ?Glucose-Capillary 70 - 99 mg/dL 12 (LL) 16 (LL)  ?(LL): Data is critically low ? ?Diabetes history: DM2? Not noted in history ?Outpatient Diabetes medications: None ?Current orders for Inpatient glycemic control: Semglee 10 QD, Novolog 0-9 units TID, Prednisone 30 mg QD ? ?Inpatient Diabetes Program Recommendations:   ? ?Had severe hypoglycemia overnight.  Please discontinue Semglee, Change correction to Novolog 0-6 units TID. ? ?Current A1C is 8.5%.   ? ?Will continue to follow while inpatient. ? ?Thank you, ?Reche Dixon, MSN, RN ?Diabetes Coordinator ?Inpatient Diabetes Program ?[phone] (team pager from 8a-5p) ? ? ? ?

## 2021-09-16 LAB — BASIC METABOLIC PANEL
Anion gap: 6 (ref 5–15)
BUN: 55 mg/dL — ABNORMAL HIGH (ref 8–23)
CO2: 29 mmol/L (ref 22–32)
Calcium: 8.7 mg/dL — ABNORMAL LOW (ref 8.9–10.3)
Chloride: 93 mmol/L — ABNORMAL LOW (ref 98–111)
Creatinine, Ser: 0.83 mg/dL (ref 0.61–1.24)
GFR, Estimated: >60 mL/min (ref >60)
Glucose, Bld: 98 mg/dL (ref 70–99)
Potassium: 4.5 mmol/L (ref 3.5–5.1)
Sodium: 128 mmol/L — ABNORMAL LOW (ref 135–145)

## 2021-09-16 LAB — GLUCOSE, CAPILLARY
Glucose-Capillary: 150 mg/dL — ABNORMAL HIGH (ref 70–99)
Glucose-Capillary: 120 mg/dL — ABNORMAL HIGH (ref 70–99)
Glucose-Capillary: 298 mg/dL — ABNORMAL HIGH (ref 70–99)
Glucose-Capillary: 341 mg/dL — ABNORMAL HIGH (ref 70–99)

## 2021-09-16 LAB — VARICELLA ZOSTER ANTIBODY, IGM: Varicella-Zoster Ab, IgM: <0.91 index (ref 0.00–0.90)

## 2021-09-16 MED ORDER — BLOOD GLUCOSE MONITOR KIT: PACK | 0 refills | Status: AC

## 2021-09-16 MED ORDER — METFORMIN HCL 500 MG PO TABS
500.0000 mg | ORAL_TABLET | Freq: Every day | ORAL | 0 refills | Status: AC
Start: 2021-09-16 — End: 2022-09-16

## 2021-09-16 MED ORDER — PREDNISONE 10 MG PO TABS: 30.0000 mg | ORAL_TABLET | Freq: Every day | ORAL | 0 refills | Status: AC

## 2021-09-16 MED ORDER — DRONABINOL 2.5 MG PO CAPS
2.5000 mg | ORAL_CAPSULE | Freq: Two times a day (BID) | ORAL | Status: DC
  Administered 2021-09-16: 2.5 mg via ORAL
  Filled 2021-09-16: qty 1

## 2021-09-16 MED ORDER — HYDROCORTISONE 1 % EX CREA: TOPICAL_CREAM | Freq: Two times a day (BID) | CUTANEOUS | 0 refills | Status: AC

## 2021-09-16 MED ORDER — AZITHROMYCIN 250 MG PO TABS: 250.0000 mg | ORAL_TABLET | Freq: Every day | ORAL | 0 refills | Status: AC

## 2021-09-16 NOTE — Discharge Summary (Addendum)
Physician Discharge Summary  ?[name] ZHY:[id number] DOB: 1950-09-22 ? ?PCP: Janie Morning, DO ? ?Admitted from: Home ?Discharged to: Home ? ?Admit date: 09/13/2021 ?Discharge date: 09/16/2021 ? ?Recommendations for Outpatient Follow-up:  ? ? Follow-up Information   ? ? Janie Morning, DO. Schedule an appointment as soon as possible for a visit in 1 week(s).   ?Specialty: Family Medicine ?Why: To be seen with repeat labs (CBC & BMP).  Kindly address management of newly diagnosed diabetes during this visit. ?Contact information: ?[address] ?[address] ?[place] [address] ?[phone] ? ? ?  ?  ? ? Icard, Bradley L, DO. Schedule an appointment as soon as possible for a visit in 1 week(s).   ?Specialty: Pulmonary Disease ?Contact information: ?[address] ?[address] ?[place] [address] ?[phone] ? ? ?  ?  ? ?  ?  ? ?  ? ? ?Home Health: None ?  ? ?Equipment/Devices: None ?  ? ?Discharge Condition: Improved and stable ?  Code Status: DNR ?Diet recommendation:  ?Discharge Diet Orders (From admission, onward)  ? ?  Start     Ordered  ? 09/16/21 0000  Diet - low sodium heart healthy       ? 09/16/21 1243  ? 09/16/21 0000  Diet Carb Modified       ? 09/16/21 1243  ? ?  ?  ? ?  ?  ? ?Discharge Diagnoses:  ?Principal Problem: ?  Acute exacerbation of chronic obstructive pulmonary disease (COPD) ([place]) ?Active Problems: ?  Acute on chronic respiratory failure with hypoxia (HCC) ?  Uncontrolled type 2 diabetes mellitus with hyperglycemia, without long-term current use of insulin ([place]) ?  Chronic diastolic CHF (congestive heart failure) ([place]) ?  Essential hypertension ?  Pulmonary embolism ([place]) ?  Hyponatremia ?  Tobacco abuse ?  Skin rash ?  Hypokalemia ?  Protein-calorie malnutrition, severe ([place]) ? ? 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  In the ED tachypneic in the 20s, mildly hypertensive, hypoxic to 86% on baseline nasal cannula oxygen 2 L/min which was increased to 4 L with improvement.  In ED received Solu-Medrol 125 mg IV x1.  Admitted for acute on chronic respiratory failure with hypoxia due to COPD exacerbation.  Course complicated by marked hyperglycemia due to steroids.  Transferred to stepdown unit for IV insulin drip.  Transitioned off of insulin drip to subcutaneous insulin overnight on 4/8, developed severe hypoglycemia requiring IV D50 resuscitation.  Clinically improved.   ? ?Assessment and Plan: ?* Acute exacerbation of chronic obstructive pulmonary disease (COPD) ([place]) ?Acute on chronic respiratory failure with hypoxia ?Reportedly underlying severe COPD.  Ongoing tobacco abuse ?Follows with Dr. Harless Nakayama pulmonology. ?Received IV Solu-Medrol 125 Mg x1, followed by IV Solu-Medrol 80 mg x 1 the next day.  However this precipitated marked hyperglycemia with CBG >500. ?Added Brovana and Pulmicort nebs ?Continued scheduled DuoNeb and as needed albuterol nebs. ?Complete course of Z-Pak. ?Flutter valve.  Clinically improved. ?Transitioned to oral prednisone 30 mg daily x3 days which she will complete tomorrow. ?Clinical bronchospasm has resolved.  Counseled extensively regarding tobacco cessation.  Outpatient follow-up with primary pulmonologist. ? ?Acute on chronic respiratory failure with hypoxia (HCC) ?At baseline patient on 2 L/min home oxygen. ?He was hypoxic in the ED in the mid 80s on his home oxygen. ?As per RT, patient uses BiPAP at bedtime,  continue.  Patient refused BiPAP overnight. ?Was on room air and saturating in the high 90s during most of the day yesterday and overnight. ?Requested RN to do home O2 evaluation prior to discharge to determine if he still needs home oxygen.  Patient's girlfriend concerned that he needs home oxygen.  I explained to her that if patient does not hypoxic 88% or less, he may not qualify for oxygen and moreover may be less beneficial and more hurtful if continued to use when not needed. ? ?As per RN, patient did not drop oxygen saturations below 93% with activity on room air.  Thereby he does not need to be on oxygen at this time. ? ?Uncontrolled type 2 diabetes mellitus with hyperglycemia, without long-term current use of insulin ([place]) ?A1c 8.5 on 09/14/2021. ?CBGs up since admission from 302 > 509 on 4/8 ?Likely precipitated by high-dose IV steroids.  Discontinued IV steroids. ?Patient was transferred to stepdown and managed with insulin drip per protocol with improvement in his glycemic control. ?Received Semglee 10 units at 10:40 PM, insulin drip discontinued at 12:30 AM, 5 units of NovoLog SSI at 12:40 AM, then developed severe hypoglycemia with CBGs in 12 > 16 requiring IV D50 resuscitation. ?Diabetes coordinator input appreciated.  Discontinued Semglee and changed SSI to very sensitive.  ?CBGs better controlled with occasional fluctuations.  Diabetes coordinator has met with patient, educated patient and recommends discharging on metformin. ?Close outpatient follow-up with PCP for further management. ? ?Chronic diastolic CHF (congestive heart failure) ([place]) ?TTE December 2022 with LVEF 60 to 93%, grade 3 diastolic dysfunction, severe pulmonary artery hypertension ?Although BNP is up at 1811, clinically euvolemic so unclear significance of elevated BNP. ?Continue prior home dose of Lasix. ? ?Essential hypertension ?Continue home dose of Toprol-XL.  Has had mildly uncontrolled and fluctuating blood pressures while  hospitalized. ?Close outpatient follow-up with PCP and may consider adding an extension, may be amlodipine low-dose. ? ?Pulmonary embolism ([place]) ?Recently diagnosed in December 2022. ?Continue Xarelto. ?As discussed with pharmacy, Megace can increase hyperviscosity and hence will change to Remeron for appetite. ?Low index of suspicion for recurrent PE given improvement with treatment for COPD. ?Megace discontinued due to recent PE and risk for hyper viscosity.  Follow-up with outpatient providers for alternate choices. ? ?Hyponatremia ?Baseline serum sodium 130-133. ?Serum sodium 129- 128. ?Clinically euvolemic. ??  SIADH. ?Could consider tapering off Wellbutrin as outpatient. ?Follow-up BMP closely as outpatient. ? ?Tobacco abuse ?Cessation counseled. ?Reports infrequent use may be couple days per week. ? ?Skin rash ?Right upper back, pruritic. ?On admission, some concern for shingles and was placed on negative pressure isolation room. ?Reviewed and clinically does not appear to be shingles.  Also no open wounds or blisters ?Reviewed case and picture with infectious disease MD on-call on 4/8 and agree that this is likely not shingles. ?Topical HC cream and monitor.  Discontinued negative pressure isolation. ?Improving. ? ?Protein-calorie malnutrition, severe ([place]) ?History of same. ?Multifactorial ?Nutritional supplements. ? ?Hypokalemia ?Replaced. ? ? ?Body mass index is 15.5 kg/m?Marland Kitchen ?Nutritional Status ?  ?  ?  ?Pressure Ulcer: ?  ? ?Consultations: ?None ? ?Procedures: ?None ? ?Discharge Instructions ?Discharge Instructions   ? ? (HEART FAILURE PATIENTS)  Call MD:  Anytime you have any of the following symptoms: 1) 3 pound weight gain in 24 hours or 5 pounds in 1 week 2) shortness of breath, with or without a dry hacking cough 3) swelling in the hands, feet or stomach 4) if you have to sleep on extra pillows at night in order to breathe.   Complete by: As directed ?  ? Call MD for:  difficulty breathing, headache  or visual disturbances   Complete by: As directed ?  ? Call MD for:  extreme fatigue   Complete by: As directed ?  ? Call MD for:  persistant dizziness or light-headedness   Complete by: As directed ?  ? Call

## 2021-09-16 NOTE — Progress Notes (Signed)
Inpatient Diabetes Program Recommendations ? ?AACE/ADA: New Consensus Statement on Inpatient Glycemic Control (2015) ? ?Target Ranges:  Prepandial:   less than 140 mg/dL ?     Peak postprandial:   less than 180 mg/dL (1-2 hours) ?     Critically ill patients:  140 - 180 mg/dL  ? ?Lab Results  ?Component Value Date  ? GLUCAP 298 (H) 09/16/2021  ? HGBA1C 8.5 (H) 09/14/2021  ? ? ?Review of Glycemic Control ? ?Diabetes history: DM2 ?Outpatient Diabetes medications: prescribed metformin prior to admission, so hasn't started it.  ?Current orders for Inpatient glycemic control: Novolog 0-6 Q4H ? ?HgbA1C - 8.5% ? ?Inpatient Diabetes Program Recommendations:   ? ?Metformin 500 QHS ? ?Spoke with pt and GF at bedside regarding his diabetes and HgbA1C of 8.5%. Pt states his PCP had just given him a prescription for metformin prior to admission, although he hasn't started taking yet. Has glucose meter and supplies at home. Pt states he drinks soda, juice and sweet tea. Discussed healthy options such as diet soda and Crystal Light. Pt said he eats 1-2 meals, then usually snacks. Drinks Boost for breakfast. Instructed to f/u with PCP with blood sugar log for her review. Reviewed glucose and A1C goals. Reviewed signs and symptoms of hyperglycemia and hypoglycemia along with treatment for both. Discussed impact of nutrition, exercise, stress, sickness, and medications on diabetes control. Spoke about as steroids are decreased, blood sugars should improve.  ?Answered questions. ?Discussed with RN and secure text to MD. ? ?Thank you. ?Lorenda Peck, RD, LDN, CDE ?Inpatient Diabetes Coordinator ?[phone]  ? ? ? ? ? ? ?

## 2021-09-16 NOTE — Discharge Instructions (Signed)

## 2021-09-16 NOTE — TOC Transition Note (Signed)
Transition of Care (TOC) - CM/SW Discharge Note ? ? ?Patient Details  ?Name: Bodi Palmeri ?MRN: [id number] ?Date of Birth: 11-10-1950 ? ?Transition of Care (TOC) CM/SW Contact:  ?Tawanna Cooler, RN ?Phone Number: ?09/16/2021, 3:00 PM ? ? ?Clinical Narrative:    ? ?Patient went home with no needs.  Did not qualify for home O2 during this admission.   ?Per [employer] rep, patient was getting home O2 from them, but per their notes, [employer] pick up the home O2 in January and patient no longer on it.   ? ? ?Final next level of care: Home/Self Care ?Barriers to Discharge: Barriers Resolved ? ? ? ? ? ? ? ?

## 2021-09-16 NOTE — Progress Notes (Signed)
AVS instructions reviewed with patient and wife at bedside by this RN. All questions answered.  ?Patient discharged with phone, wallet glasses and clothes in possession. Wheeled out by NT in wheelchair to car.  ?

## 2021-09-16 NOTE — Progress Notes (Signed)
Pt requested to be off of bipap. Pt taken off bipap and is on 2L O2. No resp distress noted.  ?

## 2021-09-18 LAB — VARICELLA-ZOSTER BY PCR: Varicella-Zoster, PCR: NEGATIVE

## 2021-10-01 ENCOUNTER — Other Ambulatory Visit: Payer: Self-pay | Admitting: *Deleted

## 2021-10-01 MED ORDER — POTASSIUM CHLORIDE CRYS ER 10 MEQ PO TBCR: 10.0000 meq | EXTENDED_RELEASE_TABLET | Freq: Every day | ORAL | 3 refills | Status: DC

## 2021-10-02 NOTE — Progress Notes (Signed)
?  Radiation Oncology         [phone] ?________________________________ ? ?Name: Tylar Amborn MRN: [id number]  ?Date: 08/26/2021  DOB: 12-Nov-1950 ? ?End of Treatment Note ? 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 ?Indication for treatment:  Curative, Definitive SBRT      ? ?Radiation treatment dates:   3/9, 3/16 and 08/26/21 ? ?Site/dose:   The target was treated to 54 Gy in 3 fractions of 18 Gy ? ?Beams/energy:   The patient was treated using stereotactic body radiotherapy according to a 3D conformal radiotherapy plan.  Volumetric arc fields were employed to deliver 6 MV X-rays.  Image guidance was performed with per fraction cone beam CT prior to treatment under personal MD supervision.  Immobilization was achieved using BodyFix Pillow. ? ?Narrative: The patient tolerated radiation treatment relatively well.    ? ?Plan: The patient has completed radiation treatment. The patient will return to radiation oncology clinic for routine followup in one month. I advised them to call or return sooner if they have any questions or concerns related to their recovery or treatment. ?________________________________ ? ?Sheral Apley Tammi Klippel, M.D. ? ? ? ?

## 2021-10-03 ENCOUNTER — Telehealth: Payer: Self-pay | Admitting: *Deleted

## 2021-10-03 ENCOUNTER — Encounter: Payer: Self-pay | Admitting: Urology

## 2021-10-03 NOTE — Progress Notes (Signed)
Telephone appointment. I spoke w/ patient's significant other "Ms. [name]" and she reports that patient is having constant shortness of breath, fatigue, and bilateral leg pains 6/10. No other issues reported at this time. ? ?Meaningful use complete. ? ?Reminded Ms. Odom of patient's "[name]" 10:30am-10/04/21 telephone appointment w/ Ashlyn Bruning PA-C. I left my extension [phone] in case patient needs anything. Ms. Leslie Andrea verbalized understanding of the information. ? ?Patient contact- [phone] or [phone]- Ms. Odom ?

## 2021-10-03 NOTE — Telephone Encounter (Signed)
Called patient to see if he would be willing to talk to [facility] tomorrow @ 10:30 am, lvm for a return call ?

## 2021-10-04 ENCOUNTER — Ambulatory Visit
Admission: RE | Admit: 2021-10-04 | Discharge: 2021-10-04 | Disposition: A | Discharge status: Home / Self Care | Payer: Medicare Other | Source: Ambulatory Visit | Attending: Urology | Admitting: Urology

## 2021-10-04 DIAGNOSIS — C3411 Malignant neoplasm of upper lobe, right bronchus or lung: Secondary | ICD-10-CM

## 2021-10-04 NOTE — Progress Notes (Addendum)
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       Interval Since Last Radiation:  6 weeks  3/9, 3/16 and 08/26/21:  The RUL lung target was treated to 54 Gy in 3 fractions of 18 Gy  05/16/21 - 05/23/21: The targets in the LLL and RLL (PET positive) were treated to 54 Gy in 3 fractions of 18 Gy  12/07/2017 - 12/11/2017// Definitive SBRT:  The RUL target was treated to 54 Gy in 3 fractions of 18 Gy   02/01/2015- 02/08/2015// Definitive SBRT: The LUL target(s) was treated to 54 Gy in 3 fractions of 18 Gy  Narrative:  I spoke with the patient and his significant other, [name], to conduct his routine scheduled 1 month follow up visit via telephone to spare the patient unnecessary potential exposure in the healthcare setting during the current COVID-19 pandemic.  The patient was notified in advance and gave permission to proceed with this visit format.   He tolerated the stereotactic radiotherapy well, without any ill side effects.                              On review of systems, obtained through his significant other, [name], the patient states that he is doing well in general.  He had a recent hospitalization from 4 7-4 10 for acute exacerbation of COPD but feels like he is recovering well from this episode at home now.  He denies any increased shortness of breath, chest pain, increased cough or hemoptysis.  He continues using his home oxygen 24/7.  He has not had any fevers, chills, night sweats or unintended weight loss.  He reports a healthy  appetite and is maintaining his weight.  Overall, he is pleased with his progress to date.  ALLERGIES:  is allergic to prevacid [lansoprazole], varenicline, ciprofloxacin, mirtazapine, and omeprazole.  Meds: Current Outpatient Medications  Medication Sig Dispense Refill   albuterol (PROVENTIL) (2.5 MG/3ML) 0.083% nebulizer solution Take 3 mLs (2.5 mg total) by nebulization every 2 (two) hours as needed for wheezing or shortness of breath. 75 mL 1   albuterol (VENTOLIN HFA) 108 (90 Base) MCG/ACT inhaler Inhale 2 puffs into the lungs every 6 (six) hours as needed for shortness of breath. 1 each 3   b complex vitamins capsule Take 1 capsule by mouth daily.     blood glucose meter kit and supplies KIT Dispense based on patient and insurance preference. Use up to four times daily as directed. 1 each 0   Budeson-Glycopyrrol-Formoterol (BREZTRI AEROSPHERE) 160-9-4.8 MCG/ACT AERO Inhale 2 puffs into the lungs 2 (two) times daily. 5.9 g 3   buPROPion (WELLBUTRIN XL) 150 MG 24 hr tablet Take 150 mg by mouth every morning.      CALCIUM PO Take 1 tablet by mouth daily.     Cholecalciferol (VITAMIN D-3 PO) Take 1 capsule by mouth daily.     fluticasone (FLONASE) 50 MCG/ACT nasal spray Place 2  sprays into both nostrils daily as needed for allergies.     folic acid (FOLVITE) 263 MCG tablet Take 800 mcg by mouth daily.     furosemide (LASIX) 40 MG tablet Take 1 tablet (40 mg total) by mouth 2 (two) times daily. 60 tablet 2   ipratropium (ATROVENT) 0.02 % nebulizer solution Take 0.25-0.5 mg by nebulization every 6 (six) hours as needed for wheezing or shortness of breath.     lactose free nutrition (BOOST) LIQD Take 237 mLs by mouth 2 (two) times daily between meals.     metFORMIN (GLUCOPHAGE) 500 MG tablet Take 1 tablet (500 mg total) by mouth daily with breakfast. 30 tablet 0   metoprolol succinate (TOPROL-XL) 25 MG 24 hr tablet Take 1 tablet (25 mg total) by mouth daily. 30 tablet 2   OXYGEN Inhale 2 L into  the lungs daily. Uses BIPAP at night     potassium chloride (KLOR-CON M) 10 MEQ tablet Take 1 tablet (10 mEq total) by mouth daily. 30 tablet 3   XARELTO 20 MG TABS tablet Take 1 tablet by mouth once daily with supper (Patient taking differently: 20 mg daily.) 30 tablet 11   No current facility-administered medications for this encounter.    Physical Findings:  vitals were not taken for this visit.  Pain Assessment Pain Score: 6  (bilateral legs)/10 Unable to assess due to telephone follow-up visit format.  Lab Findings: Lab Results  Component Value Date   WBC 8.1 09/15/2021   HGB 12.8 (L) 09/15/2021   HCT 37.4 (L) 09/15/2021   MCV 88.6 09/15/2021   PLT 283 09/15/2021     Radiographic Findings: DG Chest Port 1 View  Result Date: 09/13/2021 CLINICAL DATA:  Shortness of breath with cough. History of congestive heart failure and COPD. History of lung cancer. EXAM: PORTABLE CHEST 1 VIEW COMPARISON:  Radiographs 07/09/2021 and 05/16/2021.  CT 07/17/2021. FINDINGS: 1655 hours. Two views obtained. The heart size and mediastinal contours are stable with aortic atherosclerosis and chronic left hilar retraction. There is chronic lung disease with emphysema and biapical scarring. Compared with the most recent radiographs, there is improved overall aeration of the lungs. The nodularity in the left upper lobe along the fissure on CT may be slightly improved as well. A subpleural nodule in the left lower lobe is grossly unchanged. No evidence of pneumothorax or significant pleural effusion. The bones appear unremarkable. Telemetry leads overlie the chest. IMPRESSION: 1. No evidence of acute cardiopulmonary process. 2. The patchy airspace opacity seen on prior radiographs have improved. There is underlying left lung nodularity which was evaluated by CT 2 months ago and does not appear significantly progressive. Electronically Signed   By: Richardean Sale M.D.   On: 09/13/2021 17:12     Impression/Plan: 7.  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    He appears to have recovered well from the effects of his recent stereotactic body radiotherapy to the RUL lung lesion and is currently without complaints.  We discussed the plan to obtain a follow-up CT chest scan in approximately 2 months, to assess his response to treatment and pending this scan is stable, we will plan to resume serial CT chest scans every 3 to 6 months going forward.  I will plan to call him by telephone to review the results and recommendations following each scan.  They appear to have a good understanding of these recommendations and are comfortable and in agreement with the stated plan.  They know that they are welcome to call at anytime in the interim with any questions or concerns.    Nicholos Johns, PA-C

## 2021-10-10 ENCOUNTER — Other Ambulatory Visit: Payer: Medicare Other | Admitting: Hospice

## 2021-10-10 ENCOUNTER — Other Ambulatory Visit: Payer: Self-pay | Admitting: Adult Health

## 2021-10-11 ENCOUNTER — Other Ambulatory Visit: Payer: Medicare Other | Admitting: Hospice

## 2021-10-11 DIAGNOSIS — C3412 Malignant neoplasm of upper lobe, left bronchus or lung: Secondary | ICD-10-CM

## 2021-10-11 DIAGNOSIS — E43 Unspecified severe protein-calorie malnutrition: Secondary | ICD-10-CM

## 2021-10-11 DIAGNOSIS — J449 Chronic obstructive pulmonary disease, unspecified: Secondary | ICD-10-CM

## 2021-10-11 DIAGNOSIS — I509 Heart failure, unspecified: Secondary | ICD-10-CM

## 2021-10-11 DIAGNOSIS — Z515 Encounter for palliative care: Secondary | ICD-10-CM

## 2021-10-11 NOTE — Progress Notes (Signed)
? ? ?Manufacturing engineer ?Community Palliative Care Consult Note ?Telephone: [phone]  ?Fax: [phone] ? ?PATIENT NAME: [name] ?[address] ?[place] [address] ?[phone] (home)  ?DOB: [birth year] ?MRN: [id number] ? ?PRIMARY CARE PROVIDER:    ?Janie Morning, DO,  ?[address] ?[place] [address] ?[phone] ? ?REFERRING PROVIDER:   ?Janie Morning, DO ?[address] ?[address] ?[place],  [place] [address] ?[phone] ? ?RESPONSIBLE PARTY: Self/Melanie/Denita  ?Best number to call [phone] - Denita- significant other ?A nurse, works for [employer] ?Contact Information   ? ? Name Relation Home Work Mobile  ? Odom,Denita Significant other [phone]    ? Radermacher,Melanie Daughter   [phone]  ? ?  ? ?TELEHEALTH VISIT STATEMENT ?Due to the COVID-19 crisis, this visit was done via telemedicine from my office and it was initiated and consent by this patient and or family. Video-audio (telehealth) contact was unable to be done due to technical barriers from the patient?s side. ?I connected with patient OR PROXY by a telephone  and verified that I am speaking with the correct person. I discussed the limitations of evaluation and management by telemedicine. The patient expressed understanding and agreed to proceed. ?Palliative Care was asked to follow this patient to address advance care planning, complex medical decision making and goals of care clarification.   ?Denita is with patient during visit.  ? ?  ASSESSMENT AND / RECOMMENDATIONS:  ? ?CODE STATUS: . Patient is a Do Not Resuscitate ? ?Goals of Care: Goals include to maximize quality of life and symptom management. Patient/family is interested in hospice service when patient qualifies for it.  ?Patient requests chaplain visit. [facility] SW was contacted and she will follow up with patient/Chaplain.  ? ?Symptom Management/Plan: ?Patient continues in functional decline, shortness of breath at rest,  increasing weakness, sleeping more up to 18 hours a day, worsening memory loss.  ?Lung CA: completed 3 radiation treatments. Follow up appointment planned with Oncology to determine when to start the the next round of radiation.  ? ?COPD: Recurring exacerbation, last 09/13/21 - 09/16/21 for which he was hospitalized. Continue Breztri, Albuterol, Oxygen 2 L Min. Patient started smoking again; education on need for smoking recession. Continue with flutter valve and Incentive spirometer as ordered. Followed by Pulmonologist. ? ?CHF: Continue Lasix 40 mg BID. Elevation of BLE encouraged to promote circulation. Adhere to fluid/salt limits.  ? ?Protein Caloric Malnutrition: Worsening.  Current height/weight :5 feet 1 inch 79 Ibs down from 85 Ibs six months ago.  Albumin 2.8 offer 4-6 small high nutrient/caloric meals. Continue Glucerna nutritional drink BID. DASH diet discussed, encouraged to incorporate fruits and vegetables into his diet. ?Routine CBC CMP. ? ?Follow up: Palliative care will continue to follow for complex medical decision making, advance care planning, and clarification of goals. Return 6 weeks or prn. Encouraged to call provider sooner with any concerns.  ? ?Family /Caregiver/Community Supports: Patient lives at home with his significant.  ? ?HOSPICE ELIGIBILITY/DIAGNOSIS: TBD when patient is ready for 8 ? ?Chief Complaint: Follow up visit ? ?HISTORY OF PRESENT ILLNESS:  [name] is a 71 y.o. year old male  with  ?multiple morbidities requiring close monitoring and with high risk of complications and  mortality: Lung CA, Protein caloric malnutrition,  COPD, CHF, Depression, HTN. Patient  recently had a PET scan Nov '22 for restaging of his lung CA and was found to have a slowly enlarging lesion within the right lower lobe. Patient plans  to resume radiation treatment after his oncologist.  He endorses increasing weakness/sleepiness and short of breath at rest and with mild exertion.  ?History  obtained from review of EMR, discussion with primary team, caregiver, family and/or Mr. Picinich.  ?Review and summarization of Epic records shows history from other than patient. Rest of 10 point ROS asked and negative.  ?I reviewed as needed, available labs, patient records, imaging, studies and related documents from the EMR. ? ? ? ?PAST MEDICAL HISTORY:  ?Active Ambulatory Problems  ?  Diagnosis Date Noted  ? Pancreatic mass 11/23/2014  ? Cancer of upper lobe of left lung ([place]) 12/21/2014  ? Pneumothorax, traumatic   ? Pneumothorax of left lung after biopsy 01/04/2015  ? Former smoker 01/04/2015  ? Hyponatremia 01/05/2015  ? Hypokalemia 01/05/2015  ? COPD, severe ([place]) 01/05/2015  ? Essential hypertension 01/05/2015  ? Protein-calorie malnutrition, severe ([place]) 01/05/2015  ? Primary cancer of right upper lobe of lung ([place]) 11/30/2017  ? Severe claudication ([place]) 03/28/2018  ? Incarcerated left inguinal hernia s/p repair 06/18/2018 06/14/2018  ? PAD (peripheral artery disease) ([place])   ? Acute hypoxemic respiratory failure ([place])   ? SBO (small bowel obstruction) ([place])   ? Inguinal hernia of left side with obstruction and without gangrene   ? On total parenteral nutrition (TPN)   ? Alcoholism ([place]) 07/26/2018  ? Memory loss 10/11/2018  ? Abnormal brain MRI 10/11/2018  ? Diverticulosis 01/31/2019  ? Internal hemorrhoids 09/17/2017  ? Nodule of lower lobe of left lung 01/22/2021  ? Lung nodules   ? Acute respiratory failure with hypoxia and hypercarbia ([place]) 05/10/2021  ? Pulmonary embolism ([place]) 05/11/2021  ? Lung cancer ([place]) 05/11/2021  ? Cancer of lower lobe of left lung ([place]) 07/04/2021  ? Pulmonary hypertension ([place]) 07/09/2021  ? NSCLC of left lung ([place]) 07/09/2021  ? NSCLC of right lung ([place]) 07/09/2021  ? Acute on chronic respiratory failure with hypoxia ([place]) 07/09/2021  ? Acute exacerbation of chronic obstructive pulmonary disease (COPD) ([place]) 09/13/2021  ? Chronic diastolic CHF (congestive heart failure)  ([place]) 09/13/2021  ? Uncontrolled type 2 diabetes mellitus with hyperglycemia, without long-term current use of insulin ([place]) 09/14/2021  ? Tobacco abuse 09/14/2021  ? Skin rash 09/14/2021  ? ?Resolved Ambulatory Problems  ?  Diagnosis Date Noted  ? Pneumothorax 01/04/2015  ? Acute metabolic encephalopathy   ? ?Past Medical History:  ?Diagnosis Date  ? Allergies   ? Anxiety   ? COPD (chronic obstructive pulmonary disease) ([place])   ? Hypertension   ? Iliac artery stenosis, right ([place])   ? 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[place])   ? Lower limb ischemia 03/26/2018  ? Lung cancer   ? Pancreatitis   ? Status post left foot surgery   ? ? ?SOCIAL HX:  ?Social History  ? ?Tobacco Use  ? Smoking status: Some Days  ?  Packs/day: 1.00  ?  Years: 50.00  ?  Pack years: 50.00  ?  Types: Cigarettes  ?  Last attempt to quit: 06/25/2021  ?  Years since quitting: 0.2  ? Smokeless tobacco: Never  ?Substance Use Topics  ? Alcohol use: Yes  ?  Alcohol/week: 6.0 standard drinks  ?  Types: 6 Cans of beer per week  ? ?  ?FAMILY HX:  ?Family History  ?Problem Relation Age of Onset  ? COPD Mother   ? CAD Mother   ? Diabetes Father   ? Cancer Brother   ?     metastatic, unknown  primary  ? Cancer Paternal Grandmother   ?   ? ?ALLERGIES:  ?Allergies  ?Allergen Reactions  ? Prevacid [Lansoprazole] Other (See Comments)  ?  Bloating ("became swollen with gas")  ? Varenicline Nausea And Vomiting and Other (See Comments)  ?  Vivid, bad dreams also  ? Ciprofloxacin Other (See Comments)  ?  GI Upset  ? Mirtazapine Other (See Comments)  ?  Negatively affected sleep  ? Omeprazole Other (See Comments)  ?  "Gas"  ?   ? ?PERTINENT MEDICATIONS:  ?Outpatient Encounter Medications as of 10/11/2021  ?Medication Sig  ? albuterol (PROVENTIL) (2.5 MG/3ML) 0.083% nebulizer solution Take 3 mLs (2.5 mg total) by nebulization every 2 (two) hours as needed for wheezing or shortness of breath.  ? albuterol (VENTOLIN HFA) 108 (90 Base) MCG/ACT inhaler Inhale 2 puffs into the lungs  every 6 (six) hours as needed for shortness of breath.  ? b complex vitamins capsule Take 1 capsule by mouth daily.  ? blood glucose meter kit and supplies KIT Dispense based on patient and insurance preference

## 2021-10-14 ENCOUNTER — Other Ambulatory Visit: Payer: Self-pay

## 2021-10-14 ENCOUNTER — Other Ambulatory Visit: Payer: Medicare Other

## 2021-10-14 DIAGNOSIS — Z515 Encounter for palliative care: Secondary | ICD-10-CM

## 2021-10-14 NOTE — Progress Notes (Signed)
CHAPLAIN REFERRAL  ?PC SW completed follow-up call with patient/significant other-Denita as they have requested a chaplain visit. SW obtained background information for request. SW advised them that a referral will be sent to [facility] department for follow-up. SW extended ongoing support to them.  ?No other concerns noted.  ?*Referral sent to [facility] ( [facility]. [facility]). ?

## 2021-10-14 NOTE — Progress Notes (Signed)
COMMUNITY PALLIATIVE CARE SW NOTE ? ?PATIENT NAME: [name] ?DOB: 1951-05-21 ?MRN: [id number] ? ?PRIMARY CARE PROVIDER: Janie Morning, DO ? ?RESPONSIBLE PARTY:  ?Acct ID - Guarantor Home Phone Work Phone Relationship Acct Type  ?[id number] Milana Huntsman,* [phone]  Self P/F  ?   [place], [place], [place] [address]  ? ?SOCIAL WORK TELEPHONIC ENCOUNTER ? ?PC SW completed at telephonic visit with patient's significant other-Denita, as they have requested chaplain support. Denita advised that patient appears to be struggling spiritually. Patient has had major functional decline. Patient is aware of his declining status and worry about his mortality and his soul. Patient questions how and if he would go to heaven at the end of his life, based on the life he has lived. He grew up in [place], but has not practiced this faith in several decades. 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 SW extended ongoing SW support to her and patient, provided her contact information and encouraged her to call with any questions or concerns. No other concerns. ? ?Katheren Puller, LCSW ? ?

## 2021-10-30 ENCOUNTER — Telehealth: Payer: Self-pay | Admitting: Pulmonary Disease

## 2021-10-30 MED ORDER — POTASSIUM CHLORIDE CRYS ER 10 MEQ PO TBCR
10.0000 meq | EXTENDED_RELEASE_TABLET | Freq: Every day | ORAL | 5 refills | Status: AC
Start: 2021-10-30 — End: 2022-01-28

## 2021-10-30 NOTE — Telephone Encounter (Signed)
Rx for pt's potassium chloride has been sent to preferred pharmacy for pt. Nothing further needed.

## 2021-11-08 ENCOUNTER — Telehealth: Payer: Self-pay | Admitting: Hospice

## 2021-11-08 DIAGNOSIS — Z515 Encounter for palliative care: Secondary | ICD-10-CM

## 2021-11-08 NOTE — Telephone Encounter (Signed)
NP called patient and Denita several times to check in on patient.  No response; left voicemails with callback number.  NP arrived patient's home and no one answered the door.

## 2021-11-13 ENCOUNTER — Telehealth: Payer: Self-pay | Admitting: *Deleted

## 2021-11-13 NOTE — Telephone Encounter (Signed)
CALLED PATIENT TO INFORM OF CT FOR 11-28-21 @ [facility], ARRIVAL TIME- 12:30 PM - PATIENT TO HAVE WATER ONLY- 4 HRS.PRIOR TO TEST, PATIENT TO RECEIVE RESULTS FROM ASHLYN BRUNING ON 12-11-21 VIA TELEPHONE @ 11:30 AM, LVM FOR A RETURN CALL

## 2021-11-25 ENCOUNTER — Telehealth: Payer: Self-pay | Admitting: Pulmonary Disease

## 2021-11-25 NOTE — Telephone Encounter (Signed)
Spoke with patient and pt's DPR, Denita. He has not had any further nosebleeds as of today. Advised he hold his Xarelto today for one dose and resume tomorrow. He is on supplemental O2 therapy - he has not been switching to his home concentrator at night and has been staying on his POC per Denita. I recommended he switch to his home oxygen concentrator while at home and overnight as it is humidified. Recommended he use a small amount of saline nasal gel in each nostril. Seek emergency care if they are unable to stop any future bleeds. Verbalized understanding. Scheduled OV with me for Thursday at 430. Nothing further needed

## 2021-11-25 NOTE — Telephone Encounter (Signed)
Will send to [facility] NP  ,seen last 3 times by [facility] NP

## 2021-11-25 NOTE — Telephone Encounter (Signed)
Called and spoke with Denita. She stated that the patient has been having nose bleeds for the past 3-4 days. They are able to get the bleeding to stop eventually but it takes some time 98mins to 1hr at times. He is currently on Xarelto 20mg  once daily. He usually takes his dose in the mornings.   She denied any recent trauma to his nose. Also denied any pain or headaches. He is only using the Flonase and ipratropium for allergies and has not used these in a while.   TP, can you please advise since BI is off today? Thanks!

## 2021-11-26 ENCOUNTER — Encounter (HOSPITAL_COMMUNITY): Payer: Self-pay

## 2021-11-26 ENCOUNTER — Ambulatory Visit (HOSPITAL_COMMUNITY)
Admission: RE | Admit: 2021-11-26 | Discharge: 2021-11-26 | Disposition: A | Discharge status: Home / Self Care | Payer: Medicare Other | Source: Ambulatory Visit | Attending: Urology | Admitting: Urology

## 2021-11-26 DIAGNOSIS — C3411 Malignant neoplasm of upper lobe, right bronchus or lung: Secondary | ICD-10-CM | POA: Diagnosis not present

## 2021-11-26 DIAGNOSIS — C3491 Malignant neoplasm of unspecified part of right bronchus or lung: Secondary | ICD-10-CM | POA: Diagnosis present

## 2021-11-26 LAB — POCT I-STAT CREATININE: Creatinine, Ser: 0.9 mg/dL (ref 0.61–1.24)

## 2021-11-26 MED ORDER — SODIUM CHLORIDE (PF) 0.9 % IJ SOLN
INTRAMUSCULAR | Status: AC
  Filled 2021-11-26: qty 50

## 2021-11-26 MED ORDER — IOHEXOL 300 MG/ML  SOLN
60.0000 mL | Freq: Once | INTRAMUSCULAR | Status: AC | PRN
  Administered 2021-11-26: 60 mL via INTRAVENOUS

## 2021-11-27 NOTE — Addendum Note (Signed)
Encounter addended by: Freeman Caldron, PA-C on: 11/27/2021 2:45 PM  Actions taken: Clinical Note Signed

## 2021-11-28 ENCOUNTER — Ambulatory Visit: Payer: Medicare Other | Admitting: Nurse Practitioner

## 2021-11-28 ENCOUNTER — Ambulatory Visit (HOSPITAL_COMMUNITY): Payer: Medicare Other

## 2021-12-09 ENCOUNTER — Encounter: Payer: Self-pay | Admitting: Urology

## 2021-12-09 ENCOUNTER — Other Ambulatory Visit: Payer: Medicare Other | Admitting: Hospice

## 2021-12-09 ENCOUNTER — Telehealth: Payer: Self-pay | Admitting: Hospice

## 2021-12-09 DIAGNOSIS — Z515 Encounter for palliative care: Secondary | ICD-10-CM

## 2021-12-09 NOTE — Progress Notes (Signed)
Telephone appointment. I spoke w/ patient's significant other Ms. [name], verified her identity and began nursing interview. She reports patient has some continued SOB and bilateral leg pain/ weakness 8/10. No other issues reported at this time.  Meaningful use complete.  Reminded Ms. Odom of patient's 11:30am-12/11/21 telephone appointment w/ Ashlyn Bruning PA-C. I left my extension [phone] in case patient needs anything. Understanding verified.  Patient contact [phone]

## 2021-12-09 NOTE — Telephone Encounter (Signed)
NP called patient to check in; left a voicemail with call back number

## 2021-12-11 ENCOUNTER — Ambulatory Visit
Admission: RE | Admit: 2021-12-11 | Discharge: 2021-12-11 | Disposition: A | Discharge status: Home / Self Care | Payer: Medicare Other | Source: Ambulatory Visit | Attending: Urology | Admitting: Urology

## 2021-12-11 DIAGNOSIS — C3411 Malignant neoplasm of upper lobe, right bronchus or lung: Secondary | ICD-10-CM

## 2021-12-11 DIAGNOSIS — C3491 Malignant neoplasm of unspecified part of right bronchus or lung: Secondary | ICD-10-CM

## 2021-12-11 DIAGNOSIS — C3492 Malignant neoplasm of unspecified part of left bronchus or lung: Secondary | ICD-10-CM

## 2021-12-11 NOTE — Progress Notes (Signed)
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       Interval Since Last Radiation:  6 weeks  3/9, 3/16 and 08/26/21:  The RUL lung target was treated to 54 Gy in 3 fractions of 18 Gy  05/16/21 - 05/23/21: The targets in the LLL and RLL (PET positive) were treated to 54 Gy in 3 fractions of 18 Gy  12/07/2017 - 12/11/2017// Definitive SBRT:  The RUL target was treated to 54 Gy in 3 fractions of 18 Gy   02/01/2015- 02/08/2015// Definitive SBRT: The LUL target(s) was treated to 54 Gy in 3 fractions of 18 Gy  Narrative:  I spoke with the patient and his significant other, [name], to conduct his routine scheduled 1 month follow up visit via telephone to spare the patient unnecessary potential exposure in the healthcare setting during the current COVID-19 pandemic.  The patient was notified in advance and gave permission to proceed with this visit format.   He tolerated the stereotactic radiotherapy well, without any ill side effects and remains without complaints. His most recent posttreatment CT chest scan from 11/26/21 shows an overall stable to improved in appearance in the multiple pulmonary lesions with only minimal change in size of a small nodule in the right upper lobe lung, measuring 0.6 mm as compared to 0.3 mm previously.  We reviewed these findings today via telephone.                            On review of systems, the patient states that he is doing well in general.  He had a recent hospitalization from 4/7-4/10 for acute exacerbation of COPD  but feels like he has continued to gradually recover from this episode at home.  He denies any increased shortness of breath, chest pain, increased cough or hemoptysis.  He was having some trouble recently with epistaxis but this has since resolved and he has been able to resume his Xarelto.  He continues using his home oxygen 24/7.  He has not had any fevers, chills, night sweats or unintended weight loss.  He reports a healthy appetite and is maintaining his weight.  Overall, he is pleased with his progress to date.  ALLERGIES:  is allergic to prevacid [lansoprazole], varenicline, ciprofloxacin, mirtazapine, and omeprazole.  Meds: Current Outpatient Medications  Medication Sig Dispense Refill   albuterol (PROVENTIL) (2.5 MG/3ML) 0.083% nebulizer solution Take 3 mLs (2.5 mg total) by nebulization every 2 (two) hours as needed for wheezing or shortness of breath. 75 mL 1   albuterol (VENTOLIN HFA) 108 (90 Base) MCG/ACT inhaler Inhale 2 puffs into the lungs every 6 (six) hours as needed for shortness of breath. 1 each 3   b complex vitamins capsule Take 1 capsule by mouth daily.     blood glucose meter kit and supplies KIT Dispense based on patient and insurance preference. Use up to four times daily as directed. 1 each 0   BREZTRI AEROSPHERE 160-9-4.8  MCG/ACT AERO Inhale 2 puffs by mouth in the AM and at bedtime 10.7 g 11   buPROPion (WELLBUTRIN XL) 150 MG 24 hr tablet Take 150 mg by mouth every morning.      CALCIUM PO Take 1 tablet by mouth daily.     Cholecalciferol (VITAMIN D-3 PO) Take 1 capsule by mouth daily.     fluticasone (FLONASE) 50 MCG/ACT nasal spray Place 2 sprays into both nostrils daily as needed for allergies.     folic acid (FOLVITE) 734 MCG tablet Take 800 mcg by mouth daily.     furosemide (LASIX) 40 MG tablet Take 1 tablet (40 mg total) by mouth 2 (two) times daily. 60 tablet 2   ipratropium (ATROVENT) 0.02 % nebulizer solution Take 0.25-0.5 mg by nebulization every 6 (six)  hours as needed for wheezing or shortness of breath.     lactose free nutrition (BOOST) LIQD Take 237 mLs by mouth 2 (two) times daily between meals.     metFORMIN (GLUCOPHAGE) 500 MG tablet Take 1 tablet (500 mg total) by mouth daily with breakfast. 30 tablet 0   metoprolol succinate (TOPROL-XL) 25 MG 24 hr tablet Take 1 tablet (25 mg total) by mouth daily. 30 tablet 2   OXYGEN Inhale 2 L into the lungs daily. Uses BIPAP at night     potassium chloride (KLOR-CON M) 10 MEQ tablet Take 1 tablet (10 mEq total) by mouth daily. 30 tablet 5   XARELTO 20 MG TABS tablet Take 1 tablet by mouth once daily with supper (Patient taking differently: 20 mg daily.) 30 tablet 11   No current facility-administered medications for this encounter.    Physical Findings:  vitals were not taken for this visit.  Pain Assessment Pain Score: 8  (Bilateral leg pain)/10 Unable to assess due to telephone follow-up visit format.  Lab Findings: Lab Results  Component Value Date   WBC 8.1 09/15/2021   HGB 12.8 (L) 09/15/2021   HCT 37.4 (L) 09/15/2021   MCV 88.6 09/15/2021   PLT 283 09/15/2021     Radiographic Findings: CT Chest W Contrast  Result Date: 11/27/2021 CLINICAL DATA:  Non-small cell lung cancer (NSCLC), non-metastatic, assess treatment response NSCLC disease restaging EXAM: CT CHEST WITH CONTRAST TECHNIQUE: Multidetector CT imaging of the chest was performed during intravenous contrast administration. RADIATION DOSE REDUCTION: This exam was performed according to the departmental dose-optimization program which includes automated exposure control, adjustment of the mA and/or kV according to patient size and/or use of iterative reconstruction technique. CONTRAST:  42m OMNIPAQUE IOHEXOL 300 MG/ML  SOLN COMPARISON:  July 17, 2021 FINDINGS: Cardiovascular: As before, again seen are the mild-to-moderate atheromatous calcifications at the arch of the aorta and the coronary arteries. Pulmonary arteries have  a normal appearance. Normal heart size. No pericardial effusion. Mediastinum/Nodes: No significantly enlarged mediastinal, hilar, or axillary lymph nodes. Thyroid gland, trachea, and esophagus demonstrate no significant findings except for mild circumferential thickening of the distal esophagus. Lungs/Pleura: In the posterior aspect of the left upper lobe, abutting the major fissure (image 59 of series 7), there is 2.9 cm x 1.3 cm mass like area with architectural distortion seen in comparison to 3.3 x 1.4 cm in the previous study. There is a mass like area in the lateral aspect of the right upper lobe towards the apex (image 20 of series 7) measuring 1.7 x 2.1 cm and is more prominent in the present study. Nodular density with architectural distortion in the posterior aspect of the right  upper lobe (image 29 of series 7) measures 0.8 x 0.7 cm in comparison to 1 x 0.8 cm in the previous study. There is 0.8 cm x 0.5 cm nodular density with architectural distortion seen at the posterior aspect of the right upper lobe abutting the fissure (image 51/7) is stable. Nodular density in the basal aspect of the right upper lobe (image 63/7) measures 0.6 x 0.6 cm in comparison to 0.3 x 0.3 cm on the previous study. Stable 0.4 cm nodular density with focus of calcification in the superior segment of the right lower lobe (image 108/7). There are focal opacities seen at the right lung base of focal atelectasis-infiltrations and are slightly more prominent in the present study. Upper Abdomen: Apparent diffuse thickening of the gastric wall measuring 2.1 cm at the gastric fundus. Severe atheromatous calcifications of the abdominal aorta. Musculoskeletal: Decrease in the height of the T8 vertebral body and was present on the previous study as well. IMPRESSION: 1. 2.9 x 1.3 cm mass like density with architectural distortion in the posterior aspect of the left upper lobe has slightly decreased in the interim in comparison to 3.3 x 1.4  cm on the previous study. 2. There is a mass like area in the lateral aspect of the right upper lobe towards the apex measuring 1.7 x 2.1 cm and is more prominent in the present study. 3. Nodular density in the basal aspect of the right upper lobe (image 63/7) measures 0.6 x 0.6 cm in comparison to 0.3 x 0.3 cm on the previous study. 4. Remainder of the nodular densities in the right lung are stable as above. 5. Apparent diffuse thickening of the gastric wall, which may be due to decompression. Gastritis cannot be excluded. 6.  Severe atheromatous calcifications of the abdominal aorta. Electronically Signed   By: Frazier Richards M.D.   On: 11/27/2021 10:00    Impression/Plan: 1.  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    He has recovered well from the effects of his recent stereotactic body radiotherapy to the RUL lung lesion and remains without complaints.  His most recent posttreatment CT chest scan from 11/26/21 shows an overall stable to improved in appearance in the multiple pulmonary lesions with only minimal change in size of a small nodule in the right upper lobe lung, measuring 0.6 mm as compared to 0.3 mm previously.  We discussed the plan to obtain a follow-up CT chest scan in 3 months, to continue to monitor closely for any evidence of disease progression or recurrence and pending this scan is stable, we will plan to continue with serial CT chest scans every 3 to 6 months going forward.  I will plan to call him by telephone to review the results and recommendations following each scan.  He and his significant other, Denita, appear to have a good understanding of these recommendations and are comfortable and in agreement with the stated plan.  They know that they are  welcome to call at anytime in the interim with any questions or concerns.   I personally spent 20 minutes in this encounter including chart review, reviewing radiological studies, telephone discussion with the patient and his significant other, Denita, entering orders and completing documentation.    Dewel Lotter W.  Bekah Igoe, PA-C

## 2021-12-16 ENCOUNTER — Ambulatory Visit: Payer: Medicare Other | Admitting: Pulmonary Disease

## 2021-12-16 NOTE — Progress Notes (Deleted)
Synopsis: Referred in October 2022 for lung nodules by Janie Morning, DO  Subjective:   PATIENT ID: [name] GENDER: male DOB: Nov 06, 1950, MRN: [id number]  No chief complaint on file.   This is a 71 year old gentleman, past medical history of COPD, current smoker, hypertension, tobacco abuse.  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  The first 1 in 2016 and the other in 2019 both treated with SBRT.  He has had subsequent follow-up imaging with radiation oncology.  Recent restaging PET scan with a slowly enlarging lesion within the right lower lobe that they could not rule out inflammatory versus malignancy as well as a more solid peripheral subpleural nodule within the left upper lobe concerning for malignancy.  Patient was referred here for consideration for tissue biopsy of both lesions.  Patient states that he had percutaneous biopsy in the past and suffered a pneumothorax.  This was after biopsy of his right upper lobe apical lesion in 2019.  OV 07/01/2021: Here today for follow-up after bronchoscopy.  Patient was seen by Eric Form as well.He has already started treatments and completed SBRT with radiation oncology.  Salt Tammy in the office on 05/28/2021.  Currently on Xarelto medications had to be switched due to recent CTA with concern of chronic PE.  She encouraged smoking cessation.  Continuation of his triple therapy inhaler regimen.  Modifications to his diet due to his severe protein calorie malnutrition.  Unfortunately patient has had increasing swelling of the bilateral lower extremities.  He was on Lasix for approximately a month after discharge.  He did not have his Lasix refilled at the pharmacy.  OV 09/02/2021: Here today for follow-up.  Recently saw Roxan Diesel, NP pulmonary hypertension and bilateral lower extremity edema.  Patient responded well to Lasix.  His legs are now normal no additional edema.  Tolerating this well.  Respiratory medications  he is using his inhalers.  Still requiring oxygen.  Unfortunately his BMI is still low.  He has been using his nebulizer as well.  He is doing the best he is can.  Overall has been relatively stable for the past few weeks.  He still has cough sputum production which is a daily issue.  OV 12/16/2021:Here today for follow-up.  Patient had an exacerbation admitted to the hospital in April 2023.  Currently receiving outpatient palliative care.  Wynetta Fines radiation oncology in the office last week.       Past Medical History:  Diagnosis Date   Allergies    being around corn & grain causes itchy eyes, nasal congestion. He can eat these with no problems.   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, s/p SBRT   Pancreatitis    x2 stents placed to make patent duct to pancreas   Pulmonary hypertension ([place])    PASP 48 mmHg 03/26/18 (echo, [facility])   Severe claudication ([place])    Status post left foot surgery    Tobacco abuse      Family History  Problem Relation Age of Onset   COPD Mother    CAD Mother    Diabetes Father    Cancer Brother  metastatic, unknown primary   Cancer Paternal Grandmother      Past Surgical History:  Procedure Laterality Date   ABDOMINAL AORTOGRAM W/LOWER EXTREMITY N/A 03/30/2018   Procedure: ABDOMINAL AORTOGRAM W/LOWER EXTREMITY;  Surgeon: Nigel Mormon, MD;  Location: [facility];  Service: Cardiovascular;  Laterality: N/A;   BRONCHIAL BIOPSY  04/23/2021   Procedure: BRONCHIAL BIOPSIES;  Surgeon: Garner Nash, DO;  Location: [facility];  Service: Pulmonary;;   BRONCHIAL BRUSHINGS  04/23/2021   Procedure: BRONCHIAL BRUSHINGS;  Surgeon: Garner Nash, DO;  Location: [facility];  Service: Pulmonary;;   BRONCHIAL NEEDLE ASPIRATION BIOPSY  04/23/2021   Procedure: BRONCHIAL NEEDLE ASPIRATION BIOPSIES;  Surgeon: Garner Nash, DO;  Location: [facility];  Service: Pulmonary;;   COLECTOMY     COLOSTOMY     COLOSTOMY REVERSAL     EUS N/A 12/14/2014   Procedure: UPPER ENDOSCOPIC ULTRASOUND (EUS) LINEAR;  Surgeon: Milus Banister, MD;  Location: [facility];  Service: Endoscopy;  Laterality: N/A;   FIDUCIAL MARKER PLACEMENT  04/23/2021   Procedure: FIDUCIAL MARKER PLACEMENT;  Surgeon: Garner Nash, DO;  Location: [facility];  Service: Pulmonary;;   FOOT SURGERY Left 2006   INGUINAL HERNIA REPAIR Left 06/18/2018   Procedure: OPEN RECURRENT LEFT INGUINAL HERNIA REPAIR WITH MESH;  Surgeon: Armandina Gemma, MD;  Location: [facility];  Service: General;  Laterality: Left;   INSERTION OF MESH Left 06/18/2018   Procedure: INSERTION OF MESH;  Surgeon: Armandina Gemma, MD;  Location: [facility];  Service: General;  Laterality: Left;   LAPAROTOMY N/A 06/30/2018   Procedure: EXPLORATORY LAPAROTOMY LYSISIS OF ADHESIONS;  Surgeon: Jovita Kussmaul, MD;  Location: [facility];  Service: General;  Laterality: N/A;   PANCREAS SURGERY  2007   PERIPHERAL VASCULAR INTERVENTION  03/30/2018   Procedure: PERIPHERAL VASCULAR INTERVENTION;  Surgeon: Nigel Mormon, MD;  Location: [facility];  Service: Cardiovascular;;  Rt Iliac   UPPER GI ENDOSCOPY     VENTRAL HERNIA REPAIR  1967   VIDEO BRONCHOSCOPY WITH RADIAL ENDOBRONCHIAL ULTRASOUND  04/23/2021   Procedure: VIDEO BRONCHOSCOPY WITH RADIAL ENDOBRONCHIAL ULTRASOUND;  Surgeon: Garner Nash, DO;  Location: [facility];  Service: Pulmonary;;    Social History   Socioeconomic History   Marital status: Significant Other    Spouse name: Not on file   Number of children: 3   Years of education: 12   Highest education level: Not on file  Occupational History   Occupation: Retired  Tobacco Use   Smoking status: Some Days     Packs/day: 1.00    Years: 50.00    Total pack years: 50.00    Types: Cigarettes    Last attempt to quit: 06/25/2021    Years since quitting: 0.4   Smokeless tobacco: Never  Vaping Use   Vaping Use: Former  Substance and Sexual Activity   Alcohol use: Yes    Alcohol/week: 6.0 standard drinks of alcohol    Types: 6 Cans of beer per week   Drug use: Yes    Types: Marijuana    Comment: Last use 04/20/21   Sexual activity: Yes    Birth control/protection: Injection  Other Topics Concern   Not on file  Social History Narrative   Lives at home w/ a friend   Left-handed   Caffeine: 2 cups of coffee per day   Social Determinants of Health   Financial Resource Strain: Not on file  Food Insecurity: Not on file  Transportation Needs: Not on file  Physical Activity: Not on file  Stress: Not on file  Social Connections: Not on file  Intimate Partner Violence: Not on file     Allergies  Allergen Reactions   Prevacid [Lansoprazole] Other (See Comments)    Bloating ("became swollen with gas")   Varenicline Nausea And Vomiting and Other (See Comments)    Vivid, bad dreams also   Ciprofloxacin Other (See Comments)    GI Upset   Mirtazapine Other (See Comments)    Negatively affected sleep   Omeprazole Other (See Comments)    "Gas"     Outpatient Medications Prior to Visit  Medication Sig Dispense Refill   albuterol (PROVENTIL) (2.5 MG/3ML) 0.083% nebulizer solution Take 3 mLs (2.5 mg total) by nebulization every 2 (two) hours as needed for wheezing or shortness of breath. 75 mL 1   albuterol (VENTOLIN HFA) 108 (90 Base) MCG/ACT inhaler Inhale 2 puffs into the lungs every 6 (six) hours as needed for shortness of breath. 1 each 3   b complex vitamins capsule Take 1 capsule by mouth daily.     blood glucose meter kit and supplies KIT Dispense based on patient and insurance preference. Use up to four times daily as directed. 1 each 0   BREZTRI AEROSPHERE 160-9-4.8 MCG/ACT AERO Inhale  2 puffs by mouth in the AM and at bedtime 10.7 g 11   buPROPion (WELLBUTRIN XL) 150 MG 24 hr tablet Take 150 mg by mouth every morning.      CALCIUM PO Take 1 tablet by mouth daily.     Cholecalciferol (VITAMIN D-3 PO) Take 1 capsule by mouth daily.     fluticasone (FLONASE) 50 MCG/ACT nasal spray Place 2 sprays into both nostrils daily as needed for allergies.     folic acid (FOLVITE) 751 MCG tablet Take 800 mcg by mouth daily.     furosemide (LASIX) 40 MG tablet Take 1 tablet (40 mg total) by mouth 2 (two) times daily. 60 tablet 2   ipratropium (ATROVENT) 0.02 % nebulizer solution Take 0.25-0.5 mg by nebulization every 6 (six) hours as needed for wheezing or shortness of breath.     lactose free nutrition (BOOST) LIQD Take 237 mLs by mouth 2 (two) times daily between meals.     metFORMIN (GLUCOPHAGE) 500 MG tablet Take 1 tablet (500 mg total) by mouth daily with breakfast. 30 tablet 0   metoprolol succinate (TOPROL-XL) 25 MG 24 hr tablet Take 1 tablet (25 mg total) by mouth daily. 30 tablet 2   OXYGEN Inhale 2 L into the lungs daily. Uses BIPAP at night     potassium chloride (KLOR-CON M) 10 MEQ tablet Take 1 tablet (10 mEq total) by mouth daily. 30 tablet 5   XARELTO 20 MG TABS tablet Take 1 tablet by mouth once daily with supper (Patient taking differently: 20 mg daily.) 30 tablet 11   No facility-administered medications prior to visit.    Review of Systems  Constitutional:  Negative for chills, fever, malaise/fatigue and weight loss.  HENT:  Negative for hearing loss, sore throat and tinnitus.   Eyes:  Negative for blurred vision and double vision.  Respiratory:  Positive for cough, sputum production and shortness of breath. Negative for hemoptysis, wheezing and stridor.   Cardiovascular:  Negative for chest pain, palpitations, orthopnea, leg swelling and PND.  Gastrointestinal:  Negative for abdominal pain, constipation, diarrhea, heartburn, nausea and vomiting.  Genitourinary:   Negative for dysuria, hematuria and urgency.  Musculoskeletal:  Negative for joint pain and myalgias.  Skin:  Negative for itching and rash.  Neurological:  Negative for dizziness, tingling, weakness and headaches.  Endo/Heme/Allergies:  Negative for environmental allergies. Does not bruise/bleed easily.  Psychiatric/Behavioral:  Negative for depression. The patient is not nervous/anxious and does not have insomnia.   All other systems reviewed and are negative.    Objective:  Physical Exam Vitals reviewed.  Constitutional:      General: He is not in acute distress.    Appearance: He is well-developed.     Comments: Frail thin and significant weight loss  HENT:     Head: Normocephalic and atraumatic.  Eyes:     General: No scleral icterus.    Conjunctiva/sclera: Conjunctivae normal.     Pupils: Pupils are equal, round, and reactive to light.  Neck:     Vascular: No JVD.     Trachea: No tracheal deviation.  Cardiovascular:     Rate and Rhythm: Normal rate and regular rhythm.     Heart sounds: Normal heart sounds. No murmur heard. Pulmonary:     Effort: Pulmonary effort is normal. No tachypnea, accessory muscle usage or respiratory distress.     Breath sounds: No stridor. No wheezing, rhonchi or rales.     Comments: Diminished breath sounds bilaterally Abdominal:     General: Bowel sounds are normal. There is no distension.     Palpations: Abdomen is soft.     Tenderness: There is no abdominal tenderness.  Musculoskeletal:        General: Deformity present. No tenderness.     Cervical back: Neck supple.     Right lower leg: No edema.     Left lower leg: No edema.  Lymphadenopathy:     Cervical: No cervical adenopathy.  Skin:    General: Skin is warm and dry.     Capillary Refill: Capillary refill takes less than 2 seconds.     Findings: No rash.  Neurological:     Mental Status: He is alert and oriented to person, place, and time.  Psychiatric:        Behavior:  Behavior normal.      There were no vitals filed for this visit.     on RA BMI Readings from Last 3 Encounters:  09/16/21 15.50 kg/m  09/02/21 15.39 kg/m  07/30/21 15.74 kg/m   Wt Readings from Last 3 Encounters:  09/16/21 79 lb 5.9 oz (36 kg)  09/02/21 78 lb 12.8 oz (35.7 kg)  07/30/21 80 lb 9.6 oz (36.6 kg)     CBC    Component Value Date/Time   WBC 8.1 09/15/2021 0309   RBC 4.22 09/15/2021 0309   HGB 12.8 (L) 09/15/2021 0309   HGB 14.0 11/20/2014 1352   HCT 37.4 (L) 09/15/2021 0309   HCT 42.2 11/20/2014 1352   PLT 283 09/15/2021 0309   PLT 206 11/20/2014 1352   MCV 88.6 09/15/2021 0309   MCV 93.2 11/20/2014 1352   MCH 30.3 09/15/2021 0309   MCHC 34.2 09/15/2021 0309   RDW 17.0 (H) 09/15/2021 0309   RDW 15.2 (H) 11/20/2014 1352   LYMPHSABS 0.2 (L) 09/14/2021 0412   LYMPHSABS 1.2 11/20/2014 1352   MONOABS 0.1 09/14/2021 0412   MONOABS 0.8 11/20/2014 1352   EOSABS 0.0 09/14/2021 0412   EOSABS 0.2 11/20/2014 1352   BASOSABS 0.0 09/14/2021 0412   BASOSABS 0.1 11/20/2014 1352     Chest Imaging: 01/16/2021 CT chest: Left upper lobe subpleural lesion  concerning for malignancy, a slowly enlarging opacity within the left upper lobe along the major fissure line as well as right lower lobe subpleural lesion difficult to exclude malignancy.  This is a gentleman that already had 2 previous lung cancers. The patient's images have been independently reviewed by me.    Pulmonary Functions Testing Results:    Latest Ref Rng & Units 10/30/2014   10:02 AM  PFT Results  FVC-Pre L 2.54   FVC-Predicted Pre % 74   Pre FEV1/FVC % % 49   FEV1-Pre L 1.25   FEV1-Predicted Pre % 49   DLCO uncorrected ml/min/mmHg 7.75   DLCO UNC% % 35   DLVA Predicted % 43   TLC L 6.76   TLC % Predicted % 125   RV % Predicted % 217     FeNO:   Pathology:  2016 non-small cell lung cancer, left 2019: Right upper lobe non-small cell lung cancer  Echocardiogram:   Echocardiogram  05/11/2021: 1. Left ventricular ejection fraction, by estimation, is 60 to 65%. The  left ventricle has normal function. The left ventricle has no regional  wall motion abnormalities. Left ventricular diastolic parameters are  consistent with Grade III diastolic  dysfunction (restrictive).   2. Right ventricular systolic function is low normal. The right  ventricular size is mildly enlarged. There is severely elevated pulmonary  artery systolic pressure. Estimated PASP 64 mmHg.   3. Left atrial size was mildly dilated.   4. The mitral valve is grossly normal. Trivial mitral valve  regurgitation.   5. Tricuspid valve regurgitation is moderate to severe.   6. The aortic valve is tricuspid. Aortic valve regurgitation is not  visualized.   7. The inferior vena cava is dilated in size with >50% respiratory  variability, suggesting right atrial pressure of 8 mmHg.   Heart Catheterization:     Assessment & Plan:   No diagnosis found.    Discussion:  This is a 71 year old gentleman, current smoker, history of metachronous primary malignancies, non-small cell lung cancer of the right and left lung status post SBRT.  Bilateral lower extremity edema pulmonary hypertension history of chronic clot, on anticoagulation, severe protein calorie malnutrition, chronic hypoxemic respiratory failure and very low BMI.  Unfortunately also continues to lose weight BMI of now 615.  Plan: Overall his situation is very poor. I think his prognosis is poor for longevity. He needs to continue his current maintenance medications as he does seem to have leveled off and is stable. Continue diuretics for management of his diastolic heart failure and severe pulmonary hypertension. Continue on current inhaler regimen. No major medication management at this time. He needs to eat and maintain his weight which I think is super important.    Current Outpatient Medications:    albuterol (PROVENTIL) (2.5 MG/3ML)  0.083% nebulizer solution, Take 3 mLs (2.5 mg total) by nebulization every 2 (two) hours as needed for wheezing or shortness of breath., Disp: 75 mL, Rfl: 1   albuterol (VENTOLIN HFA) 108 (90 Base) MCG/ACT inhaler, Inhale 2 puffs into the lungs every 6 (six) hours as needed for shortness of breath., Disp: 1 each, Rfl: 3   b complex vitamins capsule, Take 1 capsule by mouth daily., Disp: , Rfl:    blood glucose meter kit and supplies KIT, Dispense based on patient and insurance preference. Use up to four times daily as directed., Disp: 1 each, Rfl: 0   BREZTRI AEROSPHERE 160-9-4.8 MCG/ACT AERO, Inhale 2 puffs by mouth in the  AM and at bedtime, Disp: 10.7 g, Rfl: 11   buPROPion (WELLBUTRIN XL) 150 MG 24 hr tablet, Take 150 mg by mouth every morning. , Disp: , Rfl:    CALCIUM PO, Take 1 tablet by mouth daily., Disp: , Rfl:    Cholecalciferol (VITAMIN D-3 PO), Take 1 capsule by mouth daily., Disp: , Rfl:    fluticasone (FLONASE) 50 MCG/ACT nasal spray, Place 2 sprays into both nostrils daily as needed for allergies., Disp: , Rfl:    folic acid (FOLVITE) 993 MCG tablet, Take 800 mcg by mouth daily., Disp: , Rfl:    furosemide (LASIX) 40 MG tablet, Take 1 tablet (40 mg total) by mouth 2 (two) times daily., Disp: 60 tablet, Rfl: 2   ipratropium (ATROVENT) 0.02 % nebulizer solution, Take 0.25-0.5 mg by nebulization every 6 (six) hours as needed for wheezing or shortness of breath., Disp: , Rfl:    lactose free nutrition (BOOST) LIQD, Take 237 mLs by mouth 2 (two) times daily between meals., Disp: , Rfl:    metFORMIN (GLUCOPHAGE) 500 MG tablet, Take 1 tablet (500 mg total) by mouth daily with breakfast., Disp: 30 tablet, Rfl: 0   metoprolol succinate (TOPROL-XL) 25 MG 24 hr tablet, Take 1 tablet (25 mg total) by mouth daily., Disp: 30 tablet, Rfl: 2   OXYGEN, Inhale 2 L into the lungs daily. Uses BIPAP at night, Disp: , Rfl:    potassium chloride (KLOR-CON M) 10 MEQ tablet, Take 1 tablet (10 mEq total) by  mouth daily., Disp: 30 tablet, Rfl: 5   XARELTO 20 MG TABS tablet, Take 1 tablet by mouth once daily with supper (Patient taking differently: 20 mg daily.), Disp: 30 tablet, Rfl: 11    Garner Nash, DO [facility] 12/16/2021 8:51 AM

## 2022-01-20 NOTE — Telephone Encounter (Signed)
Seems like encounter was open in error so closing encounter.  

## 2022-02-07 DEATH — deceased

## 2022-03-19 ENCOUNTER — Ambulatory Visit: Payer: Self-pay | Admitting: Urology

## 2024-05-12 ENCOUNTER — Encounter (HOSPITAL_COMMUNITY): Payer: Self-pay | Admitting: Surgery
